# Patient Record
Sex: Female | Born: 1970
Health system: Southern US, Community
[De-identification: ages and names within clinical notes are randomized; demographics above are authoritative.]

## PROBLEM LIST (undated history)

## (undated) DIAGNOSIS — F329 Major depressive disorder, single episode, unspecified: Secondary | ICD-10-CM

## (undated) DIAGNOSIS — E039 Hypothyroidism, unspecified: Secondary | ICD-10-CM

## (undated) DIAGNOSIS — I890 Lymphedema, not elsewhere classified: Secondary | ICD-10-CM

## (undated) DIAGNOSIS — F32A Depression, unspecified: Secondary | ICD-10-CM

## (undated) DIAGNOSIS — R011 Cardiac murmur, unspecified: Secondary | ICD-10-CM

## (undated) DIAGNOSIS — D510 Vitamin B12 deficiency anemia due to intrinsic factor deficiency: Secondary | ICD-10-CM

## (undated) DIAGNOSIS — D649 Anemia, unspecified: Secondary | ICD-10-CM

## (undated) DIAGNOSIS — E785 Hyperlipidemia, unspecified: Secondary | ICD-10-CM

## (undated) DIAGNOSIS — Z720 Tobacco use: Secondary | ICD-10-CM

## (undated) DIAGNOSIS — I499 Cardiac arrhythmia, unspecified: Secondary | ICD-10-CM

## (undated) DIAGNOSIS — R519 Headache, unspecified: Secondary | ICD-10-CM

## (undated) DIAGNOSIS — R002 Palpitations: Secondary | ICD-10-CM

## (undated) DIAGNOSIS — N879 Dysplasia of cervix uteri, unspecified: Secondary | ICD-10-CM

## (undated) DIAGNOSIS — M1990 Unspecified osteoarthritis, unspecified site: Secondary | ICD-10-CM

## (undated) DIAGNOSIS — K219 Gastro-esophageal reflux disease without esophagitis: Secondary | ICD-10-CM

## (undated) DIAGNOSIS — J189 Pneumonia, unspecified organism: Secondary | ICD-10-CM

## (undated) DIAGNOSIS — J45909 Unspecified asthma, uncomplicated: Secondary | ICD-10-CM

## (undated) DIAGNOSIS — T7840XA Allergy, unspecified, initial encounter: Secondary | ICD-10-CM

## (undated) DIAGNOSIS — R0600 Dyspnea, unspecified: Secondary | ICD-10-CM

## (undated) DIAGNOSIS — E119 Type 2 diabetes mellitus without complications: Secondary | ICD-10-CM

## (undated) DIAGNOSIS — N3090 Cystitis, unspecified without hematuria: Secondary | ICD-10-CM

## (undated) DIAGNOSIS — I10 Essential (primary) hypertension: Secondary | ICD-10-CM

## (undated) DIAGNOSIS — F419 Anxiety disorder, unspecified: Secondary | ICD-10-CM

## (undated) DIAGNOSIS — R310 Gross hematuria: Secondary | ICD-10-CM

## (undated) HISTORY — DX: Cystitis, unspecified without hematuria: N30.90

## (undated) HISTORY — DX: Hypothyroidism, unspecified: E03.9

## (undated) HISTORY — DX: Anemia, unspecified: D64.9

## (undated) HISTORY — DX: Vitamin B12 deficiency anemia due to intrinsic factor deficiency: D51.0

## (undated) HISTORY — DX: Cardiac murmur, unspecified: R01.1

## (undated) HISTORY — DX: Cardiac arrhythmia, unspecified: I49.9

## (undated) HISTORY — DX: Gross hematuria: R31.0

## (undated) HISTORY — DX: Hyperlipidemia, unspecified: E78.5

## (undated) HISTORY — DX: Unspecified osteoarthritis, unspecified site: M19.90

## (undated) HISTORY — DX: Allergy, unspecified, initial encounter: T78.40XA

## (undated) HISTORY — DX: Tobacco use: Z72.0

## (undated) HISTORY — DX: Dysplasia of cervix uteri, unspecified: N87.9

---
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
## 1995-03-18 DIAGNOSIS — F429 Obsessive-compulsive disorder, unspecified: Secondary | ICD-10-CM | POA: Insufficient documentation

## 1995-03-18 DIAGNOSIS — F418 Other specified anxiety disorders: Secondary | ICD-10-CM | POA: Insufficient documentation

## 1995-03-18 DIAGNOSIS — F410 Panic disorder [episodic paroxysmal anxiety] without agoraphobia: Secondary | ICD-10-CM | POA: Insufficient documentation

## 1995-04-17 DIAGNOSIS — IMO0002 Reserved for concepts with insufficient information to code with codable children: Secondary | ICD-10-CM | POA: Insufficient documentation

## 1998-02-21 DIAGNOSIS — F329 Major depressive disorder, single episode, unspecified: Secondary | ICD-10-CM | POA: Insufficient documentation

## 1998-12-17 DIAGNOSIS — K8020 Calculus of gallbladder without cholecystitis without obstruction: Secondary | ICD-10-CM | POA: Insufficient documentation

## 1999-03-18 DIAGNOSIS — M519 Unspecified thoracic, thoracolumbar and lumbosacral intervertebral disc disorder: Secondary | ICD-10-CM | POA: Insufficient documentation

## 2005-01-07 ENCOUNTER — Emergency Department: Payer: Self-pay | Admitting: Emergency Medicine

## 2006-12-17 DIAGNOSIS — G5603 Carpal tunnel syndrome, bilateral upper limbs: Secondary | ICD-10-CM | POA: Insufficient documentation

## 2008-05-17 ENCOUNTER — Ambulatory Visit: Payer: Self-pay | Admitting: Internal Medicine

## 2009-08-25 ENCOUNTER — Ambulatory Visit: Payer: Self-pay | Admitting: Internal Medicine

## 2009-12-17 HISTORY — PX: CARPAL TUNNEL RELEASE: SHX101

## 2010-02-18 DIAGNOSIS — I10 Essential (primary) hypertension: Secondary | ICD-10-CM | POA: Insufficient documentation

## 2010-12-17 DIAGNOSIS — F603 Borderline personality disorder: Secondary | ICD-10-CM | POA: Insufficient documentation

## 2011-12-18 DIAGNOSIS — Z8614 Personal history of Methicillin resistant Staphylococcus aureus infection: Secondary | ICD-10-CM

## 2011-12-18 HISTORY — DX: Personal history of Methicillin resistant Staphylococcus aureus infection: Z86.14

## 2011-12-18 HISTORY — PX: FOOT SURGERY: SHX648

## 2012-07-04 DIAGNOSIS — T148XXA Other injury of unspecified body region, initial encounter: Secondary | ICD-10-CM | POA: Insufficient documentation

## 2012-07-28 DIAGNOSIS — M729 Fibroblastic disorder, unspecified: Secondary | ICD-10-CM | POA: Insufficient documentation

## 2013-01-16 DIAGNOSIS — M25579 Pain in unspecified ankle and joints of unspecified foot: Secondary | ICD-10-CM | POA: Insufficient documentation

## 2013-03-17 DIAGNOSIS — M129 Arthropathy, unspecified: Secondary | ICD-10-CM | POA: Insufficient documentation

## 2013-03-24 DIAGNOSIS — R3589 Other polyuria: Secondary | ICD-10-CM | POA: Insufficient documentation

## 2013-03-24 DIAGNOSIS — N920 Excessive and frequent menstruation with regular cycle: Secondary | ICD-10-CM | POA: Insufficient documentation

## 2013-03-24 DIAGNOSIS — N923 Ovulation bleeding: Secondary | ICD-10-CM | POA: Insufficient documentation

## 2013-03-24 DIAGNOSIS — R358 Other polyuria: Secondary | ICD-10-CM | POA: Insufficient documentation

## 2013-03-24 DIAGNOSIS — N939 Abnormal uterine and vaginal bleeding, unspecified: Secondary | ICD-10-CM | POA: Insufficient documentation

## 2013-05-13 DIAGNOSIS — Z3042 Encounter for surveillance of injectable contraceptive: Secondary | ICD-10-CM | POA: Insufficient documentation

## 2013-07-10 DIAGNOSIS — G5751 Tarsal tunnel syndrome, right lower limb: Secondary | ICD-10-CM | POA: Insufficient documentation

## 2013-07-10 DIAGNOSIS — M722 Plantar fascial fibromatosis: Secondary | ICD-10-CM | POA: Insufficient documentation

## 2013-09-28 DIAGNOSIS — R509 Fever, unspecified: Secondary | ICD-10-CM | POA: Insufficient documentation

## 2013-09-28 DIAGNOSIS — E039 Hypothyroidism, unspecified: Secondary | ICD-10-CM | POA: Insufficient documentation

## 2013-09-28 DIAGNOSIS — G8929 Other chronic pain: Secondary | ICD-10-CM | POA: Insufficient documentation

## 2013-09-28 DIAGNOSIS — R6889 Other general symptoms and signs: Secondary | ICD-10-CM | POA: Insufficient documentation

## 2013-09-28 DIAGNOSIS — M545 Low back pain: Secondary | ICD-10-CM

## 2013-09-28 DIAGNOSIS — R5383 Other fatigue: Secondary | ICD-10-CM | POA: Insufficient documentation

## 2013-11-09 DIAGNOSIS — R197 Diarrhea, unspecified: Secondary | ICD-10-CM | POA: Insufficient documentation

## 2013-12-28 DIAGNOSIS — M5136 Other intervertebral disc degeneration, lumbar region: Secondary | ICD-10-CM | POA: Insufficient documentation

## 2013-12-28 DIAGNOSIS — M47816 Spondylosis without myelopathy or radiculopathy, lumbar region: Secondary | ICD-10-CM | POA: Insufficient documentation

## 2013-12-31 DIAGNOSIS — J309 Allergic rhinitis, unspecified: Secondary | ICD-10-CM | POA: Insufficient documentation

## 2014-02-16 DIAGNOSIS — N390 Urinary tract infection, site not specified: Secondary | ICD-10-CM | POA: Insufficient documentation

## 2014-04-02 DIAGNOSIS — R002 Palpitations: Secondary | ICD-10-CM | POA: Insufficient documentation

## 2014-07-18 ENCOUNTER — Emergency Department: Payer: Self-pay | Admitting: Emergency Medicine

## 2014-09-03 DIAGNOSIS — S134XXA Sprain of ligaments of cervical spine, initial encounter: Secondary | ICD-10-CM | POA: Insufficient documentation

## 2014-10-18 ENCOUNTER — Ambulatory Visit: Payer: Self-pay | Admitting: Family Medicine

## 2014-11-02 DIAGNOSIS — B89 Unspecified parasitic disease: Secondary | ICD-10-CM | POA: Insufficient documentation

## 2014-11-02 DIAGNOSIS — J189 Pneumonia, unspecified organism: Secondary | ICD-10-CM | POA: Insufficient documentation

## 2014-11-15 DIAGNOSIS — J0100 Acute maxillary sinusitis, unspecified: Secondary | ICD-10-CM | POA: Insufficient documentation

## 2014-11-15 DIAGNOSIS — J329 Chronic sinusitis, unspecified: Secondary | ICD-10-CM | POA: Insufficient documentation

## 2015-01-11 DIAGNOSIS — R062 Wheezing: Secondary | ICD-10-CM | POA: Diagnosis not present

## 2015-01-11 DIAGNOSIS — Z3042 Encounter for surveillance of injectable contraceptive: Secondary | ICD-10-CM | POA: Diagnosis not present

## 2015-01-17 DIAGNOSIS — N281 Cyst of kidney, acquired: Secondary | ICD-10-CM | POA: Insufficient documentation

## 2015-02-17 ENCOUNTER — Emergency Department: Payer: Self-pay | Admitting: Emergency Medicine

## 2015-02-17 DIAGNOSIS — L03115 Cellulitis of right lower limb: Secondary | ICD-10-CM | POA: Diagnosis not present

## 2015-02-17 DIAGNOSIS — L02415 Cutaneous abscess of right lower limb: Secondary | ICD-10-CM | POA: Diagnosis not present

## 2015-02-17 DIAGNOSIS — I10 Essential (primary) hypertension: Secondary | ICD-10-CM | POA: Diagnosis not present

## 2015-02-17 DIAGNOSIS — Z79899 Other long term (current) drug therapy: Secondary | ICD-10-CM | POA: Diagnosis not present

## 2015-02-17 DIAGNOSIS — Z7951 Long term (current) use of inhaled steroids: Secondary | ICD-10-CM | POA: Diagnosis not present

## 2015-02-18 DIAGNOSIS — R197 Diarrhea, unspecified: Secondary | ICD-10-CM | POA: Diagnosis not present

## 2015-02-18 DIAGNOSIS — L02419 Cutaneous abscess of limb, unspecified: Secondary | ICD-10-CM | POA: Insufficient documentation

## 2015-02-18 DIAGNOSIS — L03115 Cellulitis of right lower limb: Secondary | ICD-10-CM | POA: Insufficient documentation

## 2015-02-21 DIAGNOSIS — F17210 Nicotine dependence, cigarettes, uncomplicated: Secondary | ICD-10-CM | POA: Diagnosis not present

## 2015-02-21 DIAGNOSIS — E039 Hypothyroidism, unspecified: Secondary | ICD-10-CM | POA: Diagnosis not present

## 2015-02-21 DIAGNOSIS — L03115 Cellulitis of right lower limb: Secondary | ICD-10-CM | POA: Diagnosis not present

## 2015-02-21 DIAGNOSIS — R5383 Other fatigue: Secondary | ICD-10-CM | POA: Diagnosis not present

## 2015-02-21 DIAGNOSIS — B372 Candidiasis of skin and nail: Secondary | ICD-10-CM | POA: Insufficient documentation

## 2015-02-21 DIAGNOSIS — K219 Gastro-esophageal reflux disease without esophagitis: Secondary | ICD-10-CM | POA: Diagnosis not present

## 2015-02-21 DIAGNOSIS — M797 Fibromyalgia: Secondary | ICD-10-CM | POA: Diagnosis not present

## 2015-02-21 DIAGNOSIS — F329 Major depressive disorder, single episode, unspecified: Secondary | ICD-10-CM | POA: Diagnosis not present

## 2015-02-21 DIAGNOSIS — I10 Essential (primary) hypertension: Secondary | ICD-10-CM | POA: Diagnosis not present

## 2015-03-05 ENCOUNTER — Emergency Department: Payer: Self-pay | Admitting: Emergency Medicine

## 2015-03-05 DIAGNOSIS — G8929 Other chronic pain: Secondary | ICD-10-CM | POA: Diagnosis not present

## 2015-03-05 DIAGNOSIS — Z79899 Other long term (current) drug therapy: Secondary | ICD-10-CM | POA: Diagnosis not present

## 2015-03-05 DIAGNOSIS — R319 Hematuria, unspecified: Secondary | ICD-10-CM | POA: Diagnosis not present

## 2015-03-05 DIAGNOSIS — F419 Anxiety disorder, unspecified: Secondary | ICD-10-CM | POA: Diagnosis not present

## 2015-03-05 DIAGNOSIS — Z7951 Long term (current) use of inhaled steroids: Secondary | ICD-10-CM | POA: Diagnosis not present

## 2015-03-05 DIAGNOSIS — Z3202 Encounter for pregnancy test, result negative: Secondary | ICD-10-CM | POA: Diagnosis not present

## 2015-03-05 DIAGNOSIS — Z720 Tobacco use: Secondary | ICD-10-CM | POA: Diagnosis not present

## 2015-03-05 DIAGNOSIS — I10 Essential (primary) hypertension: Secondary | ICD-10-CM | POA: Diagnosis not present

## 2015-03-05 DIAGNOSIS — M545 Low back pain: Secondary | ICD-10-CM | POA: Diagnosis not present

## 2015-03-17 DIAGNOSIS — E663 Overweight: Secondary | ICD-10-CM | POA: Diagnosis not present

## 2015-03-17 DIAGNOSIS — Z720 Tobacco use: Secondary | ICD-10-CM | POA: Diagnosis not present

## 2015-03-17 DIAGNOSIS — R310 Gross hematuria: Secondary | ICD-10-CM | POA: Diagnosis not present

## 2015-03-21 DIAGNOSIS — Z1231 Encounter for screening mammogram for malignant neoplasm of breast: Secondary | ICD-10-CM | POA: Diagnosis not present

## 2015-03-23 ENCOUNTER — Ambulatory Visit
Admit: 2015-03-23 | Disposition: A | Discharge status: Home / Self Care | Payer: Self-pay | Attending: Obstetrics and Gynecology | Admitting: Obstetrics and Gynecology

## 2015-03-23 DIAGNOSIS — N281 Cyst of kidney, acquired: Secondary | ICD-10-CM | POA: Diagnosis not present

## 2015-03-23 DIAGNOSIS — I2510 Atherosclerotic heart disease of native coronary artery without angina pectoris: Secondary | ICD-10-CM | POA: Diagnosis not present

## 2015-03-23 DIAGNOSIS — R310 Gross hematuria: Secondary | ICD-10-CM | POA: Diagnosis not present

## 2015-03-23 DIAGNOSIS — K8020 Calculus of gallbladder without cholecystitis without obstruction: Secondary | ICD-10-CM | POA: Diagnosis not present

## 2015-03-23 LAB — HCG, QUANTITATIVE, PREGNANCY: Beta Hcg, Quant.: <1 m[IU]/mL

## 2015-03-25 DIAGNOSIS — R310 Gross hematuria: Secondary | ICD-10-CM | POA: Diagnosis not present

## 2015-04-05 DIAGNOSIS — M7712 Lateral epicondylitis, left elbow: Secondary | ICD-10-CM | POA: Diagnosis not present

## 2015-04-12 DIAGNOSIS — G8929 Other chronic pain: Secondary | ICD-10-CM | POA: Diagnosis not present

## 2015-04-12 DIAGNOSIS — M545 Low back pain: Secondary | ICD-10-CM | POA: Diagnosis not present

## 2015-04-12 DIAGNOSIS — M791 Myalgia: Secondary | ICD-10-CM | POA: Diagnosis not present

## 2015-04-15 DIAGNOSIS — R310 Gross hematuria: Secondary | ICD-10-CM | POA: Diagnosis not present

## 2015-04-26 DIAGNOSIS — M722 Plantar fascial fibromatosis: Secondary | ICD-10-CM | POA: Diagnosis not present

## 2015-05-13 DIAGNOSIS — M722 Plantar fascial fibromatosis: Secondary | ICD-10-CM | POA: Diagnosis not present

## 2015-06-02 DIAGNOSIS — F4310 Post-traumatic stress disorder, unspecified: Secondary | ICD-10-CM | POA: Diagnosis not present

## 2015-06-05 ENCOUNTER — Encounter: Payer: Self-pay | Admitting: Emergency Medicine

## 2015-06-05 ENCOUNTER — Emergency Department
Admission: EM | Admit: 2015-06-05 | Discharge: 2015-06-05 | Disposition: A | Discharge status: Home / Self Care | Payer: Medicare Other | Attending: Emergency Medicine | Admitting: Emergency Medicine

## 2015-06-05 DIAGNOSIS — B372 Candidiasis of skin and nail: Secondary | ICD-10-CM

## 2015-06-05 DIAGNOSIS — J029 Acute pharyngitis, unspecified: Secondary | ICD-10-CM | POA: Insufficient documentation

## 2015-06-05 DIAGNOSIS — B084 Enteroviral vesicular stomatitis with exanthem: Secondary | ICD-10-CM | POA: Diagnosis not present

## 2015-06-05 DIAGNOSIS — R21 Rash and other nonspecific skin eruption: Secondary | ICD-10-CM | POA: Diagnosis present

## 2015-06-05 DIAGNOSIS — Z720 Tobacco use: Secondary | ICD-10-CM | POA: Diagnosis not present

## 2015-06-05 DIAGNOSIS — B379 Candidiasis, unspecified: Secondary | ICD-10-CM | POA: Diagnosis not present

## 2015-06-05 LAB — CBC WITH DIFFERENTIAL/PLATELET
Basophils Absolute: 0.1 10*3/uL (ref 0–0.1)
Basophils Relative: 1 %
Eosinophils Absolute: 0.1 10*3/uL (ref 0–0.7)
Eosinophils Relative: 2 %
HCT: 40.3 % (ref 35.0–47.0)
Hemoglobin: 13.6 g/dL (ref 12.0–16.0)
Lymphocytes Relative: 29 %
Lymphs Abs: 1.7 10*3/uL (ref 1.0–3.6)
MCH: 29.5 pg (ref 26.0–34.0)
MCHC: 33.7 g/dL (ref 32.0–36.0)
MCV: 87.5 fL (ref 80.0–100.0)
Monocytes Absolute: 0.5 10*3/uL (ref 0.2–0.9)
Monocytes Relative: 8 %
Neutro Abs: 3.5 10*3/uL (ref 1.4–6.5)
Neutrophils Relative %: 60 %
Platelets: 223 10*3/uL (ref 150–440)
RBC: 4.61 MIL/uL (ref 3.80–5.20)
RDW: 14.6 % — ABNORMAL HIGH (ref 11.5–14.5)
WBC: 5.9 10*3/uL (ref 3.6–11.0)

## 2015-06-05 LAB — BASIC METABOLIC PANEL
Anion gap: 6 (ref 5–15)
BUN: 10 mg/dL (ref 6–20)
CO2: 25 mmol/L (ref 22–32)
Calcium: 8.9 mg/dL (ref 8.9–10.3)
Chloride: 110 mmol/L (ref 101–111)
Creatinine, Ser: 0.75 mg/dL (ref 0.44–1.00)
GFR calc Af Amer: >60 mL/min (ref >60)
GFR calc non Af Amer: >60 mL/min (ref >60)
Glucose, Bld: 112 mg/dL — ABNORMAL HIGH (ref 65–99)
Potassium: 4.2 mmol/L (ref 3.5–5.1)
Sodium: 141 mmol/L (ref 135–145)

## 2015-06-05 MED ORDER — IBUPROFEN 800 MG PO TABS: 800.0000 mg | ORAL_TABLET | Freq: Three times a day (TID) | ORAL | Status: DC | PRN

## 2015-06-05 MED ORDER — TRAMADOL HCL 50 MG PO TABS: 50.0000 mg | ORAL_TABLET | Freq: Four times a day (QID) | ORAL | Status: DC | PRN

## 2015-06-05 MED ORDER — NYSTATIN 100000 UNIT/GM EX CREA: 1.0000 "application " | TOPICAL_CREAM | Freq: Two times a day (BID) | CUTANEOUS | Status: DC

## 2015-06-05 NOTE — Discharge Instructions (Signed)
Hand, Foot, and Mouth Disease Hand, foot, and mouth disease is an illness caused by a type of germ (virus). Most people are better in 1 week. It can spread easily (contagious). It can be spread through contact with an infected persons:  Spit (saliva).  Snot (nasal discharge).  Poop (stool). HOME CARE  Feed your child healthy foods and drinks.  Avoid salty, spicy, or acidic foods or drinks.  Offer soft foods and cold drinks.  Ask your doctor about replacing body fluid loss (rehydration).  Avoid bottles for younger children if it causes pain. Use a cup, spoon, or syringe.  Keep your child out of childcare, schools, or other group settings during the first few days of the illness, or until they are without fever. GET HELP RIGHT AWAY IF:  Your child has signs of body fluid loss (dehydration):  Peeing (urinating) less.  Dry mouth, tongue, or lips.  Decreased tears or sunken eyes.  Dry skin.  Fast breathing.  Fussy behavior.  Poor color or pale skin.  Fingertips take more than 2 seconds to turn pink again after a gentle squeeze.  Fast weight loss.  Your child's pain does not get better.  Your child has a severe headache, stiff neck, or has a change in behavior.  Your child has sores (ulcers) or blisters on the lips or outside of the mouth. MAKE SURE YOU:  Understand these instructions.  Will watch your child's condition.  Will get help right away if your child is not doing well or gets worse. Document Released: 08/16/2011 Document Revised: 02/25/2012 Document Reviewed: 08/16/2011 [facility] Patient Information 2015 [place], [place]. This information is not intended to replace advice given to you by your health care provider. Make sure you discuss any questions you have with your health care provider.  Candida Infection A Candida infection (also called yeast, fungus, and Monilia infection) is an overgrowth of yeast that can occur anywhere on the body. A yeast  infection commonly occurs in warm, moist body areas. Usually, the infection remains localized but can spread to become a systemic infection. A yeast infection may be a sign of a more severe disease such as diabetes, leukemia, or AIDS. A yeast infection can occur in both men and women. In women, Candida vaginitis is a vaginal infection. It is one of the most common causes of vaginitis. Men usually do not have symptoms or know they have an infection until other problems develop. Men may find out they have a yeast infection because their sex partner has a yeast infection. Uncircumcised men are more likely to get a yeast infection than circumcised men. This is because the uncircumcised glans is not exposed to air and does not remain as dry as that of a circumcised glans. Older adults may develop yeast infections around dentures. CAUSES  Women  Antibiotics.  Steroid medication taken for a long time.  Being overweight (obese).  Diabetes.  Poor immune condition.  Certain serious medical conditions.  Immune suppressive medications for organ transplant patients.  Chemotherapy.  Pregnancy.  Menstruation.  Stress and fatigue.  Intravenous drug use.  Oral contraceptives.  Wearing tight-fitting clothes in the crotch area.  Catching it from a sex partner who has a yeast infection.  Spermicide.  Intravenous, urinary, or other catheters. Men  Catching it from a sex partner who has a yeast infection.  Having oral or anal sex with a person who has the infection.  Spermicide.  Diabetes.  Antibiotics.  Poor immune system.  Medications that suppress  the immune system.  Intravenous drug use.  Intravenous, urinary, or other catheters. SYMPTOMS  Women  Thick, white vaginal discharge.  Vaginal itching.  Redness and swelling in and around the vagina.  Irritation of the lips of the vagina and perineum.  Blisters on the vaginal lips and perineum.  Painful sexual  intercourse.  Low blood sugar (hypoglycemia).  Painful urination.  Bladder infections.  Intestinal problems such as constipation, indigestion, bad breath, bloating, increase in gas, diarrhea, or loose stools. Men  Men may develop intestinal problems such as constipation, indigestion, bad breath, bloating, increase in gas, diarrhea, or loose stools.  Dry, cracked skin on the penis with itching or discomfort.  Jock itch.  Dry, flaky skin.  Athlete's foot.  Hypoglycemia. DIAGNOSIS  Women  A history and an exam are performed.  The discharge may be examined under a microscope.  A culture may be taken of the discharge. Men  A history and an exam are performed.  Any discharge from the penis or areas of cracked skin will be looked at under the microscope and cultured.  Stool samples may be cultured. TREATMENT  Women  Vaginal antifungal suppositories and creams.  Medicated creams to decrease irritation and itching on the outside of the vagina.  Warm compresses to the perineal area to decrease swelling and discomfort.  Oral antifungal medications.  Medicated vaginal suppositories or cream for repeated or recurrent infections.  Wash and dry the irritation areas before applying the cream.  Eating yogurt with Lactobacillus may help with prevention and treatment.  Sometimes painting the vagina with gentian violet solution may help if creams and suppositories do not work. Men  Antifungal creams and oral antifungal medications.  Sometimes treatment must continue for 30 days after the symptoms go away to prevent recurrence. HOME CARE INSTRUCTIONS  Women  Use cotton underwear and avoid tight-fitting clothing.  Avoid colored, scented toilet paper and deodorant tampons or pads.  Do not douche.  Keep your diabetes under control.  Finish all the prescribed medications.  Keep your skin clean and dry.  Consume milk or yogurt with Lactobacillus-active culture  regularly. If you get frequent yeast infections and think that is what the infection is, there are over-the-counter medications that you can get. If the infection does not show healing in 3 days, talk to your caregiver.  Tell your sex partner you have a yeast infection. Your partner may need treatment also, especially if your infection does not clear up or recurs. Men  Keep your skin clean and dry.  Keep your diabetes under control.  Finish all prescribed medications.  Tell your sex partner that you have a yeast infection so he or she can be treated if necessary. SEEK MEDICAL CARE IF:   Your symptoms do not clear up or worsen in one week after treatment.  You have an oral temperature above 102 F (38.9 C).  You have trouble swallowing or eating for a prolonged time.  You develop blisters on and around your vagina.  You develop vaginal bleeding and it is not your menstrual period.  You develop abdominal pain.  You develop intestinal problems as mentioned above.  You get weak or light-headed.  You have painful or increased urination.  You have pain during sexual intercourse. MAKE SURE YOU:   Understand these instructions.  Will watch your condition.  Will get help right away if you are not doing well or get worse. Document Released: 01/10/2005 Document Revised: 04/19/2014 Document Reviewed: 04/24/2010 [employer] Patient Information 2015  [employer]. This information is not intended to replace advice given to you by your health care provider. Make sure you discuss any questions you have with your health care provider.

## 2015-06-05 NOTE — ED Provider Notes (Signed)
[facility] Emergency Department Provider Note ____________________________________________  Time seen: Approximately 10:29 AM  I have reviewed the triage vital signs and the nursing notes.   HISTORY  Chief Complaint Sore Throat and Rash   HPI Amanda Webb is a 44 y.o. female resistance to the emergency department for a rash on her hands and feetand a sore throat. Sore throat started 2 days ago, the rash started yesterday on her hands, and on her feet this morning. Her grandson recently had a viral illness. She keeps him during the day. She denies ever having had similar symptoms.   History reviewed. No pertinent past medical history.  There are no active problems to display for this patient.   Past Surgical History  Procedure Laterality Date  . Foot surgery    . Carpal tunnel release    . Tonsillectomy      Current Outpatient Rx  Name  Route  Sig  Dispense  Refill  . ibuprofen (ADVIL,MOTRIN) 800 MG tablet   Oral   Take 1 tablet (800 mg total) by mouth every 8 (eight) hours as needed.   30 tablet   0   . nystatin cream (MYCOSTATIN)   Topical   Apply 1 application topically 2 (two) times daily.   30 g   0   . traMADol (ULTRAM) 50 MG tablet   Oral   Take 1 tablet (50 mg total) by mouth every 6 (six) hours as needed.   9 tablet   0     Allergies Tetracyclines & related  No family history on file.  Social History History  Substance Use Topics  . Smoking status: Current Every Day Smoker  . Smokeless tobacco: Not on file  . Alcohol Use: Not on file    Review of Systems Constitutional: Positive for chills. No fever. Eyes: No visual changes. ENT: Positive for sore throat. Cardiovascular: Denies chest pain. Respiratory: Denies shortness of breath. Gastrointestinal: No abdominal pain.  No nausea, no vomiting.  No diarrhea.  No constipation. Genitourinary: Negative for dysuria. Musculoskeletal: Negative for back pain. Skin:  Rash on palms and soles, chronic rash on the skin fold of the abdomen. Neurological: Negative for headaches, focal weakness or numbness.  10-point ROS otherwise negative.  ____________________________________________   PHYSICAL EXAM:  VITAL SIGNS: ED Triage Vitals  Enc Vitals Group     BP 06/05/15 1024 136/79 mmHg     Pulse Rate 06/05/15 1024 87     Resp --      Temp 06/05/15 1024 98.9 F (37.2 C)     Temp Source 06/05/15 1024 Oral     SpO2 06/05/15 1024 99 %     Weight 06/05/15 1024 250 lb (113.399 kg)     Height 06/05/15 1024 5\' 7"  (1.702 m)     Head Cir --      Peak Flow --      Pain Score 06/05/15 1017 3     Pain Loc --      Pain Edu? --      Excl. in [place]? --     Constitutional: Alert and oriented. Well appearing and in no acute distress. Eyes: Conjunctivae are normal. PERRL. EOMI. Head: Atraumatic. Nose: No congestion/rhinnorhea. Mouth/Throat: Mucous membranes are moist.  Oropharynx is erythematous. No vesicles or lesions noted on the buccal areas. Neck: No stridor.   Cardiovascular: Normal rate, regular rhythm. Grossly normal heart sounds.  Good peripheral circulation. Respiratory: Normal respiratory effort.  No retractions. Lungs CTAB. Gastrointestinal: Soft  and nontender. No distention. No abdominal bruits. No CVA tenderness. Musculoskeletal: No lower extremity tenderness nor edema.  No joint effusions. Neurologic:  Normal speech and language. No gross focal neurologic deficits are appreciated. Speech is normal. No gait instability. Skin:  Skin is warm, dry and intact. Vesicles are present on palms and soles. Candida appearing rash on skin fold of abdomen. Psychiatric: Mood and affect are normal. Speech and behavior are normal.  ____________________________________________   LABS (all labs ordered are listed, but only abnormal results are displayed)  Labs Reviewed  CBC WITH DIFFERENTIAL/PLATELET - Abnormal; Notable for the following:    RDW 14.6 (*)    All  other components within normal limits  BASIC METABOLIC PANEL - Abnormal; Notable for the following:    Glucose, Bld 112 (*)    All other components within normal limits   ____________________________________________  EKG   ____________________________________________  RADIOLOGY   ____________________________________________   PROCEDURES  Procedure(s) performed: None  Critical Care performed: No  ____________________________________________   INITIAL IMPRESSION / ASSESSMENT AND PLAN / ED COURSE  Pertinent labs & imaging results that were available during my care of the patient were reviewed by me and considered in my medical decision making (see chart for details).  Patient was reassured. She was advised to follow up with primary care for symptoms that are not improving over the week. She was advised to return to the ER for symptoms that change or worsen if she is unable to schedule an appointment. ____________________________________________   FINAL CLINICAL IMPRESSION(S) / ED DIAGNOSES  Final diagnoses:  Hand, foot and mouth disease  Cutaneous candidiasis      Victorino Dike, FNP 06/05/15 [place], MD 06/05/15 [phone]

## 2015-06-05 NOTE — ED Notes (Signed)
MD at bedside. 

## 2015-06-05 NOTE — ED Notes (Signed)
Rash to bilateral hands x1 day , sore thoart x2days

## 2015-06-08 DIAGNOSIS — Z79899 Other long term (current) drug therapy: Secondary | ICD-10-CM | POA: Diagnosis not present

## 2015-06-09 DIAGNOSIS — F419 Anxiety disorder, unspecified: Secondary | ICD-10-CM | POA: Diagnosis not present

## 2015-06-09 DIAGNOSIS — Z0000 Encounter for general adult medical examination without abnormal findings: Secondary | ICD-10-CM | POA: Diagnosis not present

## 2015-06-09 DIAGNOSIS — J449 Chronic obstructive pulmonary disease, unspecified: Secondary | ICD-10-CM | POA: Diagnosis not present

## 2015-06-09 DIAGNOSIS — I10 Essential (primary) hypertension: Secondary | ICD-10-CM | POA: Diagnosis not present

## 2015-06-09 DIAGNOSIS — Z79899 Other long term (current) drug therapy: Secondary | ICD-10-CM | POA: Diagnosis not present

## 2015-06-09 DIAGNOSIS — E780 Pure hypercholesterolemia: Secondary | ICD-10-CM | POA: Diagnosis not present

## 2015-06-09 DIAGNOSIS — R1030 Lower abdominal pain, unspecified: Secondary | ICD-10-CM | POA: Diagnosis not present

## 2015-06-09 DIAGNOSIS — K219 Gastro-esophageal reflux disease without esophagitis: Secondary | ICD-10-CM | POA: Diagnosis not present

## 2015-06-09 DIAGNOSIS — R5383 Other fatigue: Secondary | ICD-10-CM | POA: Diagnosis not present

## 2015-06-09 DIAGNOSIS — K580 Irritable bowel syndrome with diarrhea: Secondary | ICD-10-CM | POA: Diagnosis not present

## 2015-06-09 DIAGNOSIS — Z716 Tobacco abuse counseling: Secondary | ICD-10-CM | POA: Diagnosis not present

## 2015-06-09 DIAGNOSIS — E559 Vitamin D deficiency, unspecified: Secondary | ICD-10-CM | POA: Diagnosis not present

## 2015-06-09 DIAGNOSIS — E039 Hypothyroidism, unspecified: Secondary | ICD-10-CM | POA: Diagnosis not present

## 2015-06-09 DIAGNOSIS — R7989 Other specified abnormal findings of blood chemistry: Secondary | ICD-10-CM | POA: Diagnosis not present

## 2015-06-17 DIAGNOSIS — E1169 Type 2 diabetes mellitus with other specified complication: Secondary | ICD-10-CM | POA: Insufficient documentation

## 2015-06-17 DIAGNOSIS — R7303 Prediabetes: Secondary | ICD-10-CM | POA: Insufficient documentation

## 2015-06-17 DIAGNOSIS — E049 Nontoxic goiter, unspecified: Secondary | ICD-10-CM | POA: Insufficient documentation

## 2015-06-30 DIAGNOSIS — Z01419 Encounter for gynecological examination (general) (routine) without abnormal findings: Secondary | ICD-10-CM | POA: Diagnosis not present

## 2015-06-30 DIAGNOSIS — N760 Acute vaginitis: Secondary | ICD-10-CM | POA: Diagnosis not present

## 2015-06-30 DIAGNOSIS — Z716 Tobacco abuse counseling: Secondary | ICD-10-CM | POA: Diagnosis not present

## 2015-06-30 DIAGNOSIS — E669 Obesity, unspecified: Secondary | ICD-10-CM | POA: Diagnosis not present

## 2015-06-30 DIAGNOSIS — R102 Pelvic and perineal pain: Secondary | ICD-10-CM | POA: Diagnosis not present

## 2015-07-12 DIAGNOSIS — M542 Cervicalgia: Secondary | ICD-10-CM | POA: Diagnosis not present

## 2015-07-12 DIAGNOSIS — R0989 Other specified symptoms and signs involving the circulatory and respiratory systems: Secondary | ICD-10-CM | POA: Diagnosis not present

## 2015-07-12 DIAGNOSIS — Z87891 Personal history of nicotine dependence: Secondary | ICD-10-CM | POA: Diagnosis not present

## 2015-07-14 DIAGNOSIS — F4310 Post-traumatic stress disorder, unspecified: Secondary | ICD-10-CM | POA: Diagnosis not present

## 2015-08-04 ENCOUNTER — Encounter: Payer: Self-pay | Admitting: Emergency Medicine

## 2015-08-04 ENCOUNTER — Other Ambulatory Visit: Payer: Self-pay

## 2015-08-04 ENCOUNTER — Emergency Department
Admission: EM | Admit: 2015-08-04 | Discharge: 2015-08-04 | Disposition: A | Discharge status: Home / Self Care | Payer: Medicare Other | Attending: Emergency Medicine | Admitting: Emergency Medicine

## 2015-08-04 DIAGNOSIS — Z720 Tobacco use: Secondary | ICD-10-CM | POA: Insufficient documentation

## 2015-08-04 DIAGNOSIS — H9201 Otalgia, right ear: Secondary | ICD-10-CM | POA: Diagnosis not present

## 2015-08-04 DIAGNOSIS — R51 Headache: Secondary | ICD-10-CM | POA: Insufficient documentation

## 2015-08-04 DIAGNOSIS — R110 Nausea: Secondary | ICD-10-CM

## 2015-08-04 DIAGNOSIS — R197 Diarrhea, unspecified: Secondary | ICD-10-CM | POA: Diagnosis not present

## 2015-08-04 DIAGNOSIS — Z79899 Other long term (current) drug therapy: Secondary | ICD-10-CM | POA: Insufficient documentation

## 2015-08-04 DIAGNOSIS — R519 Headache, unspecified: Secondary | ICD-10-CM

## 2015-08-04 LAB — URINALYSIS COMPLETE WITH MICROSCOPIC (ARMC ONLY)
Bacteria, UA: NONE SEEN
Bilirubin Urine: NEGATIVE
Glucose, UA: NEGATIVE mg/dL
Ketones, ur: NEGATIVE mg/dL
Leukocytes, UA: NEGATIVE
Nitrite: NEGATIVE
Protein, ur: NEGATIVE mg/dL
Specific Gravity, Urine: 1.002 — ABNORMAL LOW (ref 1.005–1.030)
pH: 6 (ref 5.0–8.0)

## 2015-08-04 LAB — COMPREHENSIVE METABOLIC PANEL
ALT: 22 U/L (ref 14–54)
AST: 25 U/L (ref 15–41)
Albumin: 4.6 g/dL (ref 3.5–5.0)
Alkaline Phosphatase: 97 U/L (ref 38–126)
Anion gap: 6 (ref 5–15)
BUN: 10 mg/dL (ref 6–20)
CO2: 25 mmol/L (ref 22–32)
Calcium: 9.2 mg/dL (ref 8.9–10.3)
Chloride: 109 mmol/L (ref 101–111)
Creatinine, Ser: 0.9 mg/dL (ref 0.44–1.00)
GFR calc Af Amer: >60 mL/min (ref >60)
GFR calc non Af Amer: >60 mL/min (ref >60)
Glucose, Bld: 140 mg/dL — ABNORMAL HIGH (ref 65–99)
Potassium: 3.6 mmol/L (ref 3.5–5.1)
Sodium: 140 mmol/L (ref 135–145)
Total Bilirubin: 0.1 mg/dL — ABNORMAL LOW (ref 0.3–1.2)
Total Protein: 8.1 g/dL (ref 6.5–8.1)

## 2015-08-04 LAB — CBC
HCT: 42.7 % (ref 35.0–47.0)
Hemoglobin: 14 g/dL (ref 12.0–16.0)
MCH: 28.6 pg (ref 26.0–34.0)
MCHC: 32.6 g/dL (ref 32.0–36.0)
MCV: 87.7 fL (ref 80.0–100.0)
Platelets: 299 10*3/uL (ref 150–440)
RBC: 4.87 MIL/uL (ref 3.80–5.20)
RDW: 14 % (ref 11.5–14.5)
WBC: 10 10*3/uL (ref 3.6–11.0)

## 2015-08-04 LAB — LIPASE, BLOOD: Lipase: 37 U/L (ref 22–51)

## 2015-08-04 NOTE — ED Provider Notes (Signed)
[facility] Emergency Department Provider Note   ____________________________________________  Time seen: 2150  I have reviewed the triage vital signs and the nursing notes.   HISTORY  Chief Complaint Weakness; Nausea; and Diarrhea   History limited by: Not Limited   HPI Amanda Webb is a 44 y.o. female who presents to the emergency department today with multiple medical complaints. Her complaints include right ear ache. Headache. Nausea and watery diarrhea. She states that the headache started 2 days ago. She states that it is worse when she tries swallowing or lays on that side. She states the headache is not present when she lies still. In addition she states she feels like she has right ear pain. She was wondering if she cleaned her ear out and hurt her tympanic membrane. Furthermore she started developing nausea and today she also had an episode of watery diarrhea today. She states she went to her primary care doctor's office where she was seen by doctor who thought she might have TMJ or stress. She was prescribed muscle relaxants. She has not tried taking these. She was unsatisfied with his diagnoses.     History reviewed. No pertinent past medical history.  There are no active problems to display for this patient.   Past Surgical History  Procedure Laterality Date  . Foot surgery    . Carpal tunnel release    . Tonsillectomy      Current Outpatient Rx  Name  Route  Sig  Dispense  Refill  . ibuprofen (ADVIL,MOTRIN) 800 MG tablet   Oral   Take 1 tablet (800 mg total) by mouth every 8 (eight) hours as needed.   30 tablet   0   . nystatin cream (MYCOSTATIN)   Topical   Apply 1 application topically 2 (two) times daily.   30 g   0   . traMADol (ULTRAM) 50 MG tablet   Oral   Take 1 tablet (50 mg total) by mouth every 6 (six) hours as needed.   9 tablet   0     Allergies Tetracyclines & related  No family history on  file.  Social History Social History  Substance Use Topics  . Smoking status: Current Every Day Smoker  . Smokeless tobacco: None  . Alcohol Use: None    Review of Systems  Constitutional: Negative for fever. Cardiovascular: Negative for chest pain. Respiratory: Negative for shortness of breath. Gastrointestinal: Positive for nausea and watery diarrhea. Musculoskeletal: Negative for back pain. Skin: Negative for rash. Neurological: Positive for earache   10-point ROS otherwise negative.  ____________________________________________   PHYSICAL EXAM:  VITAL SIGNS: ED Triage Vitals  Enc Vitals Group     BP 08/04/15 2036 135/80 mmHg     Pulse Rate 08/04/15 2036 94     Resp 08/04/15 2036 20     Temp 08/04/15 2036 98.4 F (36.9 C)     Temp Source 08/04/15 2036 Oral     SpO2 08/04/15 2036 97 %     Weight 08/04/15 2036 238 lb (107.956 kg)     Height 08/04/15 2036 5\' 7"  (1.702 m)     Head Cir --      Peak Flow --      Pain Score 08/04/15 2037 6   Constitutional: Alert and oriented. Well appearing and in no distress. Eyes: Conjunctivae are normal. PERRL. Normal extraocular movements. ENT   Head: Normocephalic and atraumatic. TMs without any erythema, bulging, fluid. No obvious perforation.   Nose:  No congestion/rhinnorhea.   Mouth/Throat: Mucous membranes are moist. No pharyngeal exudate or erythema.   Neck: No stridor. Hematological/Lymphatic/Immunilogical: No cervical lymphadenopathy. Cardiovascular: Normal rate, regular rhythm.  No murmurs, rubs, or gallops. Respiratory: Normal respiratory effort without tachypnea nor retractions. Breath sounds are clear and equal bilaterally. No wheezes/rales/rhonchi. Gastrointestinal: Soft and nontender. No distention.  Genitourinary: Deferred Musculoskeletal: Normal range of motion in all extremities. No joint effusions.  No lower extremity tenderness nor edema. Neurologic:  Normal speech and language. No gross focal  neurologic deficits are appreciated. Speech is normal.  Skin:  Skin is warm, dry and intact. No rash noted. Psychiatric: Mood and affect are normal. Speech and behavior are normal. Patient exhibits appropriate insight and judgment.  ____________________________________________    LABS (pertinent positives/negatives)  Labs Reviewed  COMPREHENSIVE METABOLIC PANEL - Abnormal; Notable for the following:    Glucose, Bld 140 (*)    Total Bilirubin 0.1 (*)    All other components within normal limits  LIPASE, BLOOD  CBC  URINALYSIS COMPLETEWITH MICROSCOPIC ([facility] ONLY)     ____________________________________________   EKG  Apolonio Schneiders, attending physician, personally viewed and interpreted this EKG  EKG Time: 2104 Rate: 82 Rhythm: NSR Axis: normal Intervals: qtc 443 QRS: narrow ST changes: no st elevation    ____________________________________________    RADIOLOGY  None  ____________________________________________   PROCEDURES  Procedure(s) performed: None  Critical Care performed: No  ____________________________________________   INITIAL IMPRESSION / ASSESSMENT AND PLAN / ED COURSE  Pertinent labs & imaging results that were available during my care of the patient were reviewed by me and considered in my medical decision making (see chart for details).  Patient had presented to the emergency department today with multiple medical complaints. On physical exam no concerning findings. I do not have a clear etiology of the symptoms however patient is under a fair amount of stress. She did even play a audio clip she had on her phone of an argument she got in with her mother. She states her mother wants to kick her out of the house. I did discuss with the patient that at this point I doubt an intracranial bleed causing the headache given her intermittent nature and lack of other neurologic findings.  ____________________________________________   FINAL  CLINICAL IMPRESSION(S) / ED DIAGNOSES  Final diagnoses:  Ear ache, right  Headache, unspecified headache type  Nausea     Nance Pear, MD 08/04/15 2226

## 2015-08-04 NOTE — ED Notes (Signed)
Pt to ED c/o generalized weakness with n/v/d.

## 2015-08-04 NOTE — Discharge Instructions (Signed)
Please seek medical attention for any high fevers, chest pain, shortness of breath, change in behavior, persistent vomiting, bloody stool or any other new or concerning symptoms.  Stress Stress-related medical problems are becoming increasingly common. The body has a built-in physical response to stressful situations. Faced with pressure, challenge or danger, we need to react quickly. Our bodies release hormones such as cortisol and adrenaline to help do this. These hormones are part of the "fight or flight" response and affect the metabolic rate, heart rate and blood pressure, resulting in a heightened, stressed state that prepares the body for optimum performance in dealing with a stressful situation. It is likely that early man required these mechanisms to stay alive, but usually modern stresses do not call for this, and the same hormones released in today's world can damage health and reduce coping ability. CAUSES  Pressure to perform at work, at school or in sports.  Threats of physical violence.  Money worries.  Arguments.  Family conflicts.  Divorce or separation from significant other.  Bereavement.  New job or unemployment.  Changes in location.  Alcohol or drug abuse. SOMETIMES, THERE IS NO PARTICULAR REASON FOR DEVELOPING STRESS. Almost all people are at risk of being stressed at some time in their lives. It is important to know that some stress is temporary and some is long term.  Temporary stress will go away when a situation is resolved. Most people can cope with short periods of stress, and it can often be relieved by relaxing, taking a walk or getting any type of exercise, chatting through issues with friends, or having a good night's sleep.  Chronic (long-term, continuous) stress is much harder to deal with. It can be psychologically and emotionally damaging. It can be harmful both for an individual and for friends and family. SYMPTOMS Everyone reacts to stress  differently. There are some common effects that help [place] recognize it. In times of extreme stress, people may:  Shake uncontrollably.  Breathe faster and deeper than normal (hyperventilate).  Vomit.  For people with asthma, stress can trigger an attack.  For some people, stress may trigger migraine headaches, ulcers, and body pain. PHYSICAL EFFECTS OF STRESS MAY INCLUDE:  Loss of energy.  Skin problems.  Aches and pains resulting from tense muscles, including neck ache, backache and tension headaches.  Increased pain from arthritis and other conditions.  Irregular heart beat (palpitations).  Periods of irritability or anger.  Apathy or depression.  Anxiety (feeling uptight or worrying).  Unusual behavior.  Loss of appetite.  Comfort eating.  Lack of concentration.  Loss of, or decreased, sex-drive.  Increased smoking, drinking, or recreational drug use.  For women, missed periods.  Ulcers, joint pain, and muscle pain. Post-traumatic stress is the stress caused by any serious accident, strong emotional damage, or extremely difficult or violent experience such as rape or war. Post-traumatic stress victims can experience mixtures of emotions such as fear, shame, depression, guilt or anger. It may include recurrent memories or images that may be haunting. These feelings can last for weeks, months or even years after the traumatic event that triggered them. Specialized treatment, possibly with medicines and psychological therapies, is available. If stress is causing physical symptoms, severe distress or making it difficult for you to function as normal, it is worth seeing your caregiver. It is important to remember that although stress is a usual part of life, extreme or prolonged stress can lead to other illnesses that will need treatment. It is better  to visit a doctor sooner rather than later. Stress has been linked to the development of high blood pressure and heart  disease, as well as insomnia and depression. There is no diagnostic test for stress since everyone reacts to it differently. But a caregiver will be able to spot the physical symptoms, such as:  Headaches.  Shingles.  Ulcers. Emotional distress such as intense worry, low mood or irritability should be detected when the doctor asks pertinent questions to identify any underlying problems that might be the cause. In case there are physical reasons for the symptoms, the doctor may also want to do some tests to exclude certain conditions. If you feel that you are suffering from stress, try to identify the aspects of your life that are causing it. Sometimes you may not be able to change or avoid them, but even a small change can have a positive ripple effect. A simple lifestyle change can make all the difference. STRATEGIES THAT CAN HELP DEAL WITH STRESS:  Delegating or sharing responsibilities.  Avoiding confrontations.  Learning to be more assertive.  Regular exercise.  Avoid using alcohol or street drugs to cope.  Eating a healthy, balanced diet, rich in fruit and vegetables and proteins.  Finding humor or absurdity in stressful situations.  Never taking on more than you know you can handle comfortably.  Organizing your time better to get as much done as possible.  Talking to friends or family and sharing your thoughts and fears.  Listening to music or relaxation tapes.  Relaxation techniques like deep breathing, meditation, and yoga.  Tensing and then relaxing your muscles, starting at the toes and working up to the head and neck. If you think that you would benefit from help, either in identifying the things that are causing your stress or in learning techniques to help you relax, see a caregiver who is capable of helping you with this. Rather than relying on medications, it is usually better to try and identify the things in your life that are causing stress and try to deal with  them. There are many techniques of managing stress including counseling, psychotherapy, aromatherapy, yoga, and exercise. Your caregiver can help you determine what is best for you. Document Released: 02/23/2003 Document Revised: 12/08/2013 Document Reviewed: 01/20/2008 [facility] Patient Information 2015 [place], [place]. This information is not intended to replace advice given to you by your health care provider. Make sure you discuss any questions you have with your health care provider.

## 2015-08-22 ENCOUNTER — Emergency Department
Admission: EM | Admit: 2015-08-22 | Discharge: 2015-08-22 | Disposition: A | Discharge status: Home / Self Care | Payer: Medicare Other | Attending: Emergency Medicine | Admitting: Emergency Medicine

## 2015-08-22 DIAGNOSIS — L03211 Cellulitis of face: Secondary | ICD-10-CM | POA: Diagnosis not present

## 2015-08-22 DIAGNOSIS — Z79899 Other long term (current) drug therapy: Secondary | ICD-10-CM | POA: Insufficient documentation

## 2015-08-22 DIAGNOSIS — Z720 Tobacco use: Secondary | ICD-10-CM | POA: Insufficient documentation

## 2015-08-22 DIAGNOSIS — I10 Essential (primary) hypertension: Secondary | ICD-10-CM | POA: Insufficient documentation

## 2015-08-22 DIAGNOSIS — M25462 Effusion, left knee: Secondary | ICD-10-CM | POA: Diagnosis not present

## 2015-08-22 DIAGNOSIS — R220 Localized swelling, mass and lump, head: Secondary | ICD-10-CM | POA: Diagnosis present

## 2015-08-22 HISTORY — DX: Depression, unspecified: F32.A

## 2015-08-22 HISTORY — DX: Gastro-esophageal reflux disease without esophagitis: K21.9

## 2015-08-22 HISTORY — DX: Essential (primary) hypertension: I10

## 2015-08-22 HISTORY — DX: Anxiety disorder, unspecified: F41.9

## 2015-08-22 HISTORY — DX: Major depressive disorder, single episode, unspecified: F32.9

## 2015-08-22 MED ORDER — CLINDAMYCIN HCL 150 MG PO CAPS: 450.0000 mg | ORAL_CAPSULE | Freq: Four times a day (QID) | ORAL | Status: DC

## 2015-08-22 MED ORDER — CLINDAMYCIN HCL 150 MG PO CAPS
450.0000 mg | ORAL_CAPSULE | Freq: Once | ORAL | Status: AC
  Administered 2015-08-22: 450 mg via ORAL
  Filled 2015-08-22: qty 3

## 2015-08-22 NOTE — Discharge Instructions (Signed)

## 2015-08-22 NOTE — ED Notes (Signed)
Nurse Nellie in room with patient at this time

## 2015-08-22 NOTE — ED Notes (Signed)
Pt came to ED w/ c/o facial swelling.  Pt suspects it is MRSA, stating that her daughter and boyfriend currently have infection.  Pt sts that she woke up yesterday w/ white head inside nostril and her face has begun to swell since.

## 2015-08-22 NOTE — ED Provider Notes (Signed)
[facility] Emergency Department Provider Note  ____________________________________________  Time seen: Approximately 115 PM  I have reviewed the triage vital signs and the nursing notes.   HISTORY  Chief Complaint Facial Swelling    HPI Amanda Webb is a 44 y.o. female with a history of multiple sites of cellulitis who is presenting today with left-sided facial swelling that she believes is spreading from a pustule within her left naris. She also said that she'll low-grade fever at home to 99.8. Did not take any Tylenol or ibuprofen. Michela Pitcher that she has been admitted multiple times in the past for cellulitis. Says that multiple people in her family have MRSA and that she believes that is where she caught this. Said that the left naris had a pustule in this morning which she picked at. Stating now that the left side her face now is swollen and is painful to touch.   Past Medical History  Diagnosis Date  . Hypertension   . Anxiety   . GERD (gastroesophageal reflux disease)   . Depression     There are no active problems to display for this patient.   Past Surgical History  Procedure Laterality Date  . Foot surgery    . Carpal tunnel release    . Tonsillectomy      Current Outpatient Rx  Name  Route  Sig  Dispense  Refill  . ibuprofen (ADVIL,MOTRIN) 800 MG tablet   Oral   Take 1 tablet (800 mg total) by mouth every 8 (eight) hours as needed.   30 tablet   0   . nystatin cream (MYCOSTATIN)   Topical   Apply 1 application topically 2 (two) times daily.   30 g   0   . traMADol (ULTRAM) 50 MG tablet   Oral   Take 1 tablet (50 mg total) by mouth every 6 (six) hours as needed.   9 tablet   0     Allergies Tetracyclines & related  No family history on file.  Social History Social History  Substance Use Topics  . Smoking status: Current Every Day Smoker  . Smokeless tobacco: None  . Alcohol Use: None    Review of  Systems Constitutional: "Low-grade fever" Eyes: No visual changes. ENT: No sore throat. Cardiovascular: Denies chest pain. Respiratory: Denies shortness of breath. Gastrointestinal: No abdominal pain.  No nausea, no vomiting.  No diarrhea.  No constipation. Genitourinary: Negative for dysuria. Musculoskeletal: Negative for back pain. Skin: Negative for rash. Neurological: Negative for headaches, focal weakness or numbness.  10-point ROS otherwise negative.  ____________________________________________   PHYSICAL EXAM:  VITAL SIGNS: ED Triage Vitals  Enc Vitals Group     BP 08/22/15 1031 118/58 mmHg     Pulse Rate 08/22/15 1031 75     Resp 08/22/15 1031 18     Temp 08/22/15 1031 98.7 F (37.1 C)     Temp Source 08/22/15 1031 Oral     SpO2 08/22/15 1031 100 %     Weight 08/22/15 1031 238 lb (107.956 kg)     Height 08/22/15 1031 5\' 7"  (1.702 m)     Head Cir --      Peak Flow --      Pain Score 08/22/15 1031 5     Pain Loc --      Pain Edu? --      Excl. in [place]? --     Constitutional: Alert and oriented. Well appearing and in no acute distress. Eyes:  Conjunctivae are normal. PERRL. EOMI. Head: Atraumatic. Nose: No congestion/rhinnorhea. Mouth/Throat: Mucous membranes are moist.  Oropharynx non-erythematous. Neck: No stridor.   Cardiovascular: Normal rate, regular rhythm. Grossly normal heart sounds.  Good peripheral circulation. Respiratory: Normal respiratory effort.  No retractions. Lungs CTAB. Gastrointestinal: Soft and nontender. No distention. No abdominal bruits. No CVA tenderness. Musculoskeletal: No lower extremity tenderness nor edema.  No joint effusions. Neurologic:  Normal speech and language. No gross focal neurologic deficits are appreciated. No gait instability. Skin: Mild erythema to the left knee or with mild swelling surrounding. There is no induration or pus.  The left naris as well as the surrounding area is mildly tender to palpation.  Psychiatric:  Mood and affect are normal. Speech and behavior are normal.  ____________________________________________   LABS (all labs ordered are listed, but only abnormal results are displayed)  Labs Reviewed - No data to display ____________________________________________  EKG   ____________________________________________  RADIOLOGY   ____________________________________________   PROCEDURES   ____________________________________________   INITIAL IMPRESSION / ASSESSMENT AND PLAN / ED COURSE  Pertinent labs & imaging results that were available during my care of the patient were reviewed by me and considered in my medical decision making (see chart for details).  Patient with likely early cellulitis. We'll discharge with by mouth clindamycin. Discussed strict return precautions including worsening swelling, redness or fever. Says that IV clindamycin as worked for her in the past. Has had MRSA in the past will cover for MRSA. ____________________________________________   FINAL CLINICAL IMPRESSION(S) / ED DIAGNOSES  Acute facial cellulitis. Initial visit.    Orbie Pyo, MD 08/22/15 [id number]

## 2015-08-24 DIAGNOSIS — R011 Cardiac murmur, unspecified: Secondary | ICD-10-CM | POA: Diagnosis not present

## 2015-09-09 DIAGNOSIS — E119 Type 2 diabetes mellitus without complications: Secondary | ICD-10-CM | POA: Diagnosis not present

## 2015-09-09 DIAGNOSIS — K219 Gastro-esophageal reflux disease without esophagitis: Secondary | ICD-10-CM | POA: Diagnosis not present

## 2015-09-09 DIAGNOSIS — E559 Vitamin D deficiency, unspecified: Secondary | ICD-10-CM | POA: Diagnosis not present

## 2015-09-09 DIAGNOSIS — E538 Deficiency of other specified B group vitamins: Secondary | ICD-10-CM | POA: Diagnosis not present

## 2015-09-09 DIAGNOSIS — R5383 Other fatigue: Secondary | ICD-10-CM | POA: Diagnosis not present

## 2015-09-09 DIAGNOSIS — F419 Anxiety disorder, unspecified: Secondary | ICD-10-CM | POA: Diagnosis not present

## 2015-09-09 DIAGNOSIS — Z828 Family history of other disabilities and chronic diseases leading to disablement, not elsewhere classified: Secondary | ICD-10-CM | POA: Diagnosis not present

## 2015-09-09 DIAGNOSIS — I10 Essential (primary) hypertension: Secondary | ICD-10-CM | POA: Diagnosis not present

## 2015-09-09 DIAGNOSIS — E663 Overweight: Secondary | ICD-10-CM | POA: Diagnosis not present

## 2015-09-09 DIAGNOSIS — J302 Other seasonal allergic rhinitis: Secondary | ICD-10-CM | POA: Diagnosis not present

## 2015-09-13 DIAGNOSIS — F4310 Post-traumatic stress disorder, unspecified: Secondary | ICD-10-CM | POA: Diagnosis not present

## 2015-09-23 ENCOUNTER — Encounter: Payer: Self-pay | Admitting: *Deleted

## 2015-09-23 DIAGNOSIS — K922 Gastrointestinal hemorrhage, unspecified: Secondary | ICD-10-CM | POA: Insufficient documentation

## 2015-09-23 DIAGNOSIS — Z720 Tobacco use: Secondary | ICD-10-CM | POA: Diagnosis not present

## 2015-09-23 DIAGNOSIS — I10 Essential (primary) hypertension: Secondary | ICD-10-CM | POA: Insufficient documentation

## 2015-09-23 DIAGNOSIS — K649 Unspecified hemorrhoids: Secondary | ICD-10-CM | POA: Diagnosis not present

## 2015-09-23 DIAGNOSIS — Z3202 Encounter for pregnancy test, result negative: Secondary | ICD-10-CM | POA: Diagnosis not present

## 2015-09-23 DIAGNOSIS — Z79899 Other long term (current) drug therapy: Secondary | ICD-10-CM | POA: Diagnosis not present

## 2015-09-23 DIAGNOSIS — Z792 Long term (current) use of antibiotics: Secondary | ICD-10-CM | POA: Diagnosis not present

## 2015-09-23 DIAGNOSIS — G8929 Other chronic pain: Secondary | ICD-10-CM | POA: Insufficient documentation

## 2015-09-23 DIAGNOSIS — M545 Low back pain: Secondary | ICD-10-CM | POA: Diagnosis present

## 2015-09-23 LAB — URINALYSIS COMPLETE WITH MICROSCOPIC (ARMC ONLY)
Bacteria, UA: NONE SEEN
Bilirubin Urine: NEGATIVE
Glucose, UA: NEGATIVE mg/dL
Ketones, ur: NEGATIVE mg/dL
Leukocytes, UA: NEGATIVE
Nitrite: NEGATIVE
Protein, ur: NEGATIVE mg/dL
Specific Gravity, Urine: 1.009 (ref 1.005–1.030)
pH: 6 (ref 5.0–8.0)

## 2015-09-23 LAB — CBC
HCT: 39 % (ref 35.0–47.0)
Hemoglobin: 13 g/dL (ref 12.0–16.0)
MCH: 29.5 pg (ref 26.0–34.0)
MCHC: 33.3 g/dL (ref 32.0–36.0)
MCV: 88.5 fL (ref 80.0–100.0)
Platelets: 274 10*3/uL (ref 150–440)
RBC: 4.41 MIL/uL (ref 3.80–5.20)
RDW: 14.7 % — ABNORMAL HIGH (ref 11.5–14.5)
WBC: 10.3 10*3/uL (ref 3.6–11.0)

## 2015-09-23 LAB — COMPREHENSIVE METABOLIC PANEL
ALT: 17 U/L (ref 14–54)
AST: 18 U/L (ref 15–41)
Albumin: 4.2 g/dL (ref 3.5–5.0)
Alkaline Phosphatase: 79 U/L (ref 38–126)
Anion gap: 5 (ref 5–15)
BUN: 8 mg/dL (ref 6–20)
CO2: 25 mmol/L (ref 22–32)
Calcium: 8.8 mg/dL — ABNORMAL LOW (ref 8.9–10.3)
Chloride: 111 mmol/L (ref 101–111)
Creatinine, Ser: 0.77 mg/dL (ref 0.44–1.00)
GFR calc Af Amer: >60 mL/min (ref >60)
GFR calc non Af Amer: >60 mL/min (ref >60)
Glucose, Bld: 107 mg/dL — ABNORMAL HIGH (ref 65–99)
Potassium: 3.3 mmol/L — ABNORMAL LOW (ref 3.5–5.1)
Sodium: 141 mmol/L (ref 135–145)
Total Bilirubin: 0.4 mg/dL (ref 0.3–1.2)
Total Protein: 7.4 g/dL (ref 6.5–8.1)

## 2015-09-23 LAB — LIPASE, BLOOD: Lipase: 34 U/L (ref 22–51)

## 2015-09-23 MED ORDER — ACETAMINOPHEN 325 MG PO TABS
650.0000 mg | ORAL_TABLET | Freq: Once | ORAL | Status: AC
  Administered 2015-09-23: 650 mg via ORAL
  Filled 2015-09-23: qty 2

## 2015-09-23 NOTE — ED Notes (Signed)
Pt reports had episode of blood in stool x 1 today. Low grade fever at home. Mild abdominal pain and back pain.

## 2015-09-23 NOTE — ED Notes (Signed)
Pt very anxious and tearful in triage, crying stating "I just don't know how this is happening, I never have a fever, you have to take me back to a room now". Encourage pt that we would draw lab work and check her initial blood counts. Reassured her that her vital signs were stable. We would give her tylenol for her fever.

## 2015-09-24 ENCOUNTER — Emergency Department
Admission: EM | Admit: 2015-09-24 | Discharge: 2015-09-24 | Disposition: A | Discharge status: Home / Self Care | Payer: Medicare Other | Attending: Emergency Medicine | Admitting: Emergency Medicine

## 2015-09-24 DIAGNOSIS — K649 Unspecified hemorrhoids: Secondary | ICD-10-CM

## 2015-09-24 DIAGNOSIS — K922 Gastrointestinal hemorrhage, unspecified: Secondary | ICD-10-CM

## 2015-09-24 LAB — PREGNANCY, URINE: Preg Test, Ur: NEGATIVE

## 2015-09-24 MED ORDER — HYDROCORTISONE 1 % EX CREA: 1.0000 "application " | TOPICAL_CREAM | Freq: Two times a day (BID) | CUTANEOUS | Status: DC

## 2015-09-24 NOTE — ED Provider Notes (Signed)
[facility] Emergency Department Provider Note  ____________________________________________  Time seen: Approximately 1:15 AM  I have reviewed the triage vital signs and the nursing notes.   HISTORY  Chief Complaint Fever and Rectal Bleeding    HPI Amanda Webb is a 44 y.o. female with a history of IBS who is presenting tonight with bright red blood per rectum. She says that she normally has abnormal and loose stools but had a small amount of bright red blood surrounding her stool this evening. She says her stool now is mustard colored and partially formed. She does not note any recent increase in the frequency or looseness of her stools. She is concerned for C. difficile because of multiple antibiotics over the past several months.She says that she has had hemorrhoid in the past with similar presentation of symptoms. She is also concerned because she says she had a fever of 100.7 in the waiting room and was given Tylenol. I do not see any documentation of a fever 100.7. There is one temp of 99.3 documented at 2155. She was given Tylenol at 2117 but the only temperature documented before that is 98.2. She denies any rigors or chills at this time. She said that she did have a low-grade fever at home on her initial triage note. However, did not take any antipyretics at that time. Denies any cough, runny nose nausea or vomiting. No sick contacts. Denies loss of bowel or bladder continence.   Past Medical History  Diagnosis Date  . Hypertension   . Anxiety   . GERD (gastroesophageal reflux disease)   . Depression     There are no active problems to display for this patient.   Past Surgical History  Procedure Laterality Date  . Foot surgery    . Carpal tunnel release    . Tonsillectomy      Current Outpatient Rx  Name  Route  Sig  Dispense  Refill  . clindamycin (CLEOCIN) 150 MG capsule   Oral   Take 3 capsules (450 mg total) by mouth 4 (four) times  daily.   120 capsule   0   . ibuprofen (ADVIL,MOTRIN) 800 MG tablet   Oral   Take 1 tablet (800 mg total) by mouth every 8 (eight) hours as needed.   30 tablet   0   . nystatin cream (MYCOSTATIN)   Topical   Apply 1 application topically 2 (two) times daily.   30 g   0   . traMADol (ULTRAM) 50 MG tablet   Oral   Take 1 tablet (50 mg total) by mouth every 6 (six) hours as needed.   9 tablet   0     Allergies Tetracyclines & related  No family history on file.  Social History Social History  Substance Use Topics  . Smoking status: Current Every Day Smoker  . Smokeless tobacco: None  . Alcohol Use: No    Review of Systems Constitutional: No fever/chills Eyes: No visual changes. ENT: No sore throat. Cardiovascular: Denies chest pain. Respiratory: Denies shortness of breath. Gastrointestinal: Denies abdominal pain at this time despite the mild abdominal and back pain listed on the triage note.  No nausea, no vomiting.  No diarrhea.  No constipation. Genitourinary: Negative for dysuria. Musculoskeletal: Chronic low back pain which is unchanged.  Skin: Negative for rash. Neurological: Negative for headaches, focal weakness or numbness.  10-point ROS otherwise negative.  ____________________________________________   PHYSICAL EXAM:  VITAL SIGNS: ED Triage Vitals  Enc Vitals Group     BP 09/23/15 2112 119/62 mmHg     Pulse Rate 09/23/15 2112 94     Resp 09/23/15 2112 16     Temp 09/23/15 2112 98.2 F (36.8 C)     Temp Source 09/23/15 2112 Oral     SpO2 09/23/15 2112 98 %     Weight 09/23/15 2112 238 lb (107.956 kg)     Height 09/23/15 2112 5\' 6"  (1.676 m)     Head Cir --      Peak Flow --      Pain Score 09/23/15 2110 3     Pain Loc --      Pain Edu? --      Excl. in [place]? --     Constitutional: Alert and oriented. Well appearing and in no acute distress. Eyes: Conjunctivae are normal. PERRL. EOMI. Head: Atraumatic. Nose: No  congestion/rhinnorhea. Mouth/Throat: Mucous membranes are moist.  Oropharynx non-erythematous. Neck: No stridor.   Cardiovascular: Normal rate, regular rhythm. Grossly normal heart sounds.  Good peripheral circulation. Respiratory: Normal respiratory effort.  No retractions. Lungs CTAB. Gastrointestinal: Soft and nontender. No distention. No abdominal bruits. No CVA tenderness. Rectal exam with soft brown stool which is weakly heme positive. There is a non-engorged and nontender hemorrhoid at 11:00.  Musculoskeletal: No lower extremity tenderness nor edema.  No joint effusions. No tenderness to the back. There is no saddle anesthesia. 5 out of 5 strength to the bilateral lower extremities. Neurologic:  Normal speech and language. No gross focal neurologic deficits are appreciated. No gait instability. Skin:  Skin is warm, dry and intact. No rash noted. Psychiatric: Mood and affect are normal. Speech and behavior are normal.  ____________________________________________   LABS (all labs ordered are listed, but only abnormal results are displayed)  Labs Reviewed  COMPREHENSIVE METABOLIC PANEL - Abnormal; Notable for the following:    Potassium 3.3 (*)    Glucose, Bld 107 (*)    Calcium 8.8 (*)    All other components within normal limits  CBC - Abnormal; Notable for the following:    RDW 14.7 (*)    All other components within normal limits  URINALYSIS COMPLETEWITH MICROSCOPIC ([facility] ONLY) - Abnormal; Notable for the following:    Color, Urine STRAW (*)    APPearance CLEAR (*)    Hgb urine dipstick 1+ (*)    Squamous Epithelial / LPF 0-5 (*)    All other components within normal limits  LIPASE, BLOOD  PREGNANCY, URINE   ____________________________________________  EKG   ____________________________________________  RADIOLOGY   ____________________________________________   PROCEDURES    ____________________________________________   INITIAL IMPRESSION / ASSESSMENT  AND PLAN / ED COURSE  Pertinent labs & imaging results that were available during my care of the patient were reviewed by me and considered in my medical decision making (see chart for details).  ----------------------------------------- 2:05 AM on 09/24/2015 -----------------------------------------  Reassuring workup including lab work as well as urine. I discussed at length with the patient how her presentation is low risk for C. difficile considering there is no increased frequency or looseness of her stool. She is asked repeatedly about the fever that she had in triage.  I do not see any documentation of this fever and at this point it has been about 5 hours since the dosing of Tylenol and the patient does not have any Reiger's or chills to indicate that she is mounting a fever. We discussed at length return precautions including any new  or worsening symptoms, return of the fever, abdominal pain or looseness or increased frequency of the stools. The patient understands the plan and is willing to comply. ___________________________________________   FINAL CLINICAL IMPRESSION(S) / ED DIAGNOSES  Acute GI bleed secondary to hemorrhoid. Initial visit.    Orbie Pyo, MD 09/24/15 [phone]

## 2015-09-24 NOTE — ED Notes (Signed)
Call bell at side, pt declines offer for warm blankets.

## 2015-09-24 NOTE — Discharge Instructions (Signed)

## 2015-09-24 NOTE — ED Notes (Signed)
Pt very anxious. Pt states "i am never sick, i never have a fever, they told me out front it was 100.4. My grandson will die if i get sick, i just can't be sick." pt provided with reassurance. Call bell at side. Pt declines offer for warm blanket.

## 2015-10-03 ENCOUNTER — Other Ambulatory Visit: Payer: Self-pay | Admitting: Physician Assistant

## 2015-10-03 DIAGNOSIS — R935 Abnormal findings on diagnostic imaging of other abdominal regions, including retroperitoneum: Secondary | ICD-10-CM | POA: Diagnosis not present

## 2015-10-03 DIAGNOSIS — E876 Hypokalemia: Secondary | ICD-10-CM | POA: Diagnosis not present

## 2015-10-03 DIAGNOSIS — R1032 Left lower quadrant pain: Secondary | ICD-10-CM | POA: Diagnosis not present

## 2015-10-03 DIAGNOSIS — D72829 Elevated white blood cell count, unspecified: Secondary | ICD-10-CM | POA: Diagnosis not present

## 2015-10-03 DIAGNOSIS — G43A0 Cyclical vomiting, not intractable: Secondary | ICD-10-CM | POA: Diagnosis not present

## 2015-10-03 DIAGNOSIS — R1011 Right upper quadrant pain: Secondary | ICD-10-CM | POA: Diagnosis not present

## 2015-10-03 DIAGNOSIS — R197 Diarrhea, unspecified: Secondary | ICD-10-CM | POA: Diagnosis not present

## 2015-10-03 DIAGNOSIS — K219 Gastro-esophageal reflux disease without esophagitis: Secondary | ICD-10-CM | POA: Diagnosis not present

## 2015-10-03 DIAGNOSIS — R1031 Right lower quadrant pain: Secondary | ICD-10-CM | POA: Diagnosis not present

## 2015-10-03 DIAGNOSIS — D729 Disorder of white blood cells, unspecified: Secondary | ICD-10-CM | POA: Diagnosis not present

## 2015-10-07 ENCOUNTER — Ambulatory Visit
Admission: RE | Admit: 2015-10-07 | Discharge: 2015-10-07 | Disposition: A | Discharge status: Home / Self Care | Payer: Medicare Other | Source: Ambulatory Visit | Attending: Physician Assistant | Admitting: Physician Assistant

## 2015-10-07 ENCOUNTER — Emergency Department
Admission: EM | Admit: 2015-10-07 | Discharge: 2015-10-07 | Disposition: A | Discharge status: Home / Self Care | Payer: Medicare Other | Attending: Emergency Medicine | Admitting: Emergency Medicine

## 2015-10-07 DIAGNOSIS — R1030 Lower abdominal pain, unspecified: Secondary | ICD-10-CM | POA: Insufficient documentation

## 2015-10-07 DIAGNOSIS — I10 Essential (primary) hypertension: Secondary | ICD-10-CM | POA: Insufficient documentation

## 2015-10-07 DIAGNOSIS — K8020 Calculus of gallbladder without cholecystitis without obstruction: Secondary | ICD-10-CM | POA: Insufficient documentation

## 2015-10-07 DIAGNOSIS — Z79899 Other long term (current) drug therapy: Secondary | ICD-10-CM | POA: Diagnosis not present

## 2015-10-07 DIAGNOSIS — R1032 Left lower quadrant pain: Secondary | ICD-10-CM

## 2015-10-07 DIAGNOSIS — L299 Pruritus, unspecified: Secondary | ICD-10-CM | POA: Diagnosis not present

## 2015-10-07 DIAGNOSIS — R252 Cramp and spasm: Secondary | ICD-10-CM | POA: Diagnosis not present

## 2015-10-07 DIAGNOSIS — R1031 Right lower quadrant pain: Secondary | ICD-10-CM

## 2015-10-07 DIAGNOSIS — Z720 Tobacco use: Secondary | ICD-10-CM | POA: Insufficient documentation

## 2015-10-07 DIAGNOSIS — Z792 Long term (current) use of antibiotics: Secondary | ICD-10-CM | POA: Diagnosis not present

## 2015-10-07 DIAGNOSIS — R197 Diarrhea, unspecified: Secondary | ICD-10-CM | POA: Insufficient documentation

## 2015-10-07 DIAGNOSIS — R21 Rash and other nonspecific skin eruption: Secondary | ICD-10-CM | POA: Diagnosis present

## 2015-10-07 MED ORDER — EPINEPHRINE 0.3 MG/0.3ML IJ SOAJ: 0.3000 mg | Freq: Once | INTRAMUSCULAR | Status: DC

## 2015-10-07 MED ORDER — IOHEXOL 300 MG/ML  SOLN
100.0000 mL | Freq: Once | INTRAMUSCULAR | Status: AC | PRN
  Administered 2015-10-07: 100 mL via INTRAVENOUS

## 2015-10-07 NOTE — ED Notes (Signed)
MD at bedside. 

## 2015-10-07 NOTE — ED Provider Notes (Signed)
[facility] Emergency Department Provider Note   ____________________________________________  Time seen: 1210  I have reviewed the triage vital signs and the nursing notes.   HISTORY  Chief Complaint Allergic Reaction   History limited by: Not Limited   HPI Amanda Webb is a 44 y.o. female who presents to the emergency department today because of the concern for a possible allergic reaction to a barium swallow study performed earlier today. The patient states that she was doing the study because of concerns for diarrhea and possible IBS. A number of hours after this study and after the barium was swallowed she started developing itchiness on her palms. She saw small red lesions on her palms. She also complained of some right throat swelling and difficulty swallowing. The patient states that she has a history of allergies. She has never had an allergy to any contrast material in the past.   Past Medical History  Diagnosis Date  . Hypertension   . Anxiety   . GERD (gastroesophageal reflux disease)   . Depression     There are no active problems to display for this patient.   Past Surgical History  Procedure Laterality Date  . Foot surgery    . Carpal tunnel release    . Tonsillectomy      Current Outpatient Rx  Name  Route  Sig  Dispense  Refill  . clindamycin (CLEOCIN) 150 MG capsule   Oral   Take 3 capsules (450 mg total) by mouth 4 (four) times daily.   120 capsule   0   . hydrocortisone cream (PREPARATION H HYDROCORTISONE) 1 %   Topical   Apply 1 application topically 2 (two) times daily.   30 g   0   . ibuprofen (ADVIL,MOTRIN) 800 MG tablet   Oral   Take 1 tablet (800 mg total) by mouth every 8 (eight) hours as needed.   30 tablet   0   . nystatin cream (MYCOSTATIN)   Topical   Apply 1 application topically 2 (two) times daily.   30 g   0   . traMADol (ULTRAM) 50 MG tablet   Oral   Take 1 tablet (50 mg total) by  mouth every 6 (six) hours as needed.   9 tablet   0     Allergies Tetracyclines & related  No family history on file.  Social History Social History  Substance Use Topics  . Smoking status: Current Every Day Smoker  . Smokeless tobacco: Not on file  . Alcohol Use: No    Review of Systems  Constitutional: Negative for fever. Cardiovascular: Negative for chest pain. Respiratory: Negative for shortness of breath. Gastrointestinal: Negative for abdominal pain, vomiting and diarrhea. Genitourinary: Negative for dysuria. Musculoskeletal: Negative for back pain. Skin: Small red lesions on palms Neurological: Negative for headaches, focal weakness or numbness.  10-point ROS otherwise negative.  ____________________________________________   PHYSICAL EXAM:  VITAL SIGNS: ED Triage Vitals  Enc Vitals Group     BP 10/07/15 1201 140/61 mmHg     Pulse Rate 10/07/15 1201 80     Resp 10/07/15 1201 20     Temp 10/07/15 1201 98.4 F (36.9 C)     Temp Source 10/07/15 1201 Oral     SpO2 10/07/15 1201 100 %     Weight 10/07/15 1201 234 lb (106.142 kg)     Height 10/07/15 1201 5\' 6"  (1.676 m)   Constitutional: Alert and oriented. Well appearing and in no  distress. Eyes: Conjunctivae are normal. PERRL. Normal extraocular movements. ENT   Head: Normocephalic and atraumatic.   Nose: No congestion/rhinnorhea.   Mouth/Throat: Mucous membranes are moist.   Neck: No stridor. Hematological/Lymphatic/Immunilogical: No cervical lymphadenopathy. Cardiovascular: Normal rate, regular rhythm.  No murmurs, rubs, or gallops. Respiratory: Normal respiratory effort without tachypnea nor retractions. Breath sounds are clear and equal bilaterally. No wheezes/rales/rhonchi. Gastrointestinal: Soft and nontender. No distention.  Genitourinary: Deferred Musculoskeletal: Normal range of motion in all extremities. No joint effusions.  No lower extremity tenderness nor edema. Neurologic:   Normal speech and language. No gross focal neurologic deficits are appreciated. Speech is normal.  Skin:  Skin is warm, dry and intact. No rash noted. No hives. Psychiatric: Mood and affect are normal. Speech and behavior are normal. Patient exhibits appropriate insight and judgment.  ____________________________________________    LABS (pertinent positives/negatives)  None  ____________________________________________   EKG  None  ____________________________________________    RADIOLOGY  None   ____________________________________________   PROCEDURES  Procedure(s) performed: None  Critical Care performed: No  ____________________________________________   INITIAL IMPRESSION / ASSESSMENT AND PLAN / ED COURSE  Pertinent labs & imaging results that were available during my care of the patient were reviewed by me and considered in my medical decision making (see chart for details).  Patient presents to the ED because of concerns for possible allergy reaction. The patient on exam has no signs or symptoms of an allergic reaction. Additionally given that the symptoms started a number of hours after the barium was ingested I think it is unlikely that this represented a true allergic reaction. Furthermore she was only complain ing of one side of her throat swelling. She is in no respiratory distress. I do not feel that she requires any emergent treatment for any possible allergic reaction. I will however give her a new prescription for EpiPen given that her current EpiPen is out of date. Discussed return precautions.  ____________________________________________   FINAL CLINICAL IMPRESSION(S) / ED DIAGNOSES  Final diagnoses:  Itching     Nance Pear, MD 10/07/15 1241
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
## 2015-10-07 NOTE — Discharge Instructions (Signed)
Please seek medical attention for any high fevers, chest pain, shortness of breath, change in behavior, persistent vomiting, bloody stool or any other new or concerning symptoms.   Allergies An allergy is an abnormal reaction to a substance by the body's defense system (immune system). Allergies can develop at any age. WHAT CAUSES ALLERGIES? An allergic reaction happens when the immune system mistakenly reacts to a normally harmless substance, called an allergen, as if it were harmful. The immune system releases antibodies to fight the substance. Antibodies eventually release a chemical called histamine into the bloodstream. The release of histamine is meant to protect the body from infection, but it also causes discomfort. An allergic reaction can be triggered by:  Eating an allergen.  Inhaling an allergen.  Touching an allergen. WHAT TYPES OF ALLERGIES ARE THERE? There are many types of allergies. Common types include:  Seasonal allergies. People with this type of allergy are usually allergic to substances that are only present during certain seasons, such as molds and pollens.  Food allergies.  Drug allergies.  Insect allergies.  Animal dander allergies. WHAT ARE SYMPTOMS OF ALLERGIES? Possible allergy symptoms include:  Swelling of the lips, face, tongue, mouth, or throat.  Sneezing, coughing, or wheezing.  Nasal congestion.  Tingling in the mouth.  Rash.  Itching.  Itchy, red, swollen areas of skin (hives).  Watery eyes.  Vomiting.  Diarrhea.  Dizziness.  Lightheadedness.  Fainting.  Trouble breathing or swallowing.  Chest tightness.  Rapid heartbeat. HOW ARE ALLERGIES DIAGNOSED? Allergies are diagnosed with a medical and family history and one or more of the following:  Skin tests.  Blood tests.  A food diary. A food diary is a record of all the foods and drinks you have in a day and of all the symptoms you experience.  The results of an  elimination diet. An elimination diet involves eliminating foods from your diet and then adding them back in one by one to find out if a certain food causes an allergic reaction. HOW ARE ALLERGIES TREATED? There is no cure for allergies, but allergic reactions can be treated with medicine. Severe reactions usually need to be treated at a hospital. HOW CAN REACTIONS BE PREVENTED? The best way to prevent an allergic reaction is by avoiding the substance you are allergic to. Allergy shots and medicines can also help prevent reactions in some cases. People with severe allergic reactions may be able to prevent a life-threatening reaction called anaphylaxis with a medicine given right after exposure to the allergen.   This information is not intended to replace advice given to you by your health care provider. Make sure you discuss any questions you have with your health care provider.   Document Released: 02/26/2003 Document Revised: 12/24/2014 Document Reviewed: 09/14/2014 Elsevier Interactive Patient Education [employer].

## 2015-10-18 ENCOUNTER — Other Ambulatory Visit: Payer: Self-pay | Admitting: Physician Assistant

## 2015-10-18 DIAGNOSIS — R1011 Right upper quadrant pain: Secondary | ICD-10-CM

## 2015-10-18 DIAGNOSIS — R197 Diarrhea, unspecified: Secondary | ICD-10-CM | POA: Diagnosis not present

## 2015-10-20 ENCOUNTER — Encounter: Payer: Self-pay | Admitting: *Deleted

## 2015-10-21 ENCOUNTER — Ambulatory Visit
Admission: RE | Admit: 2015-10-21 | Discharge: 2015-10-21 | Disposition: A | Discharge status: Home / Self Care | Payer: Medicare Other | Source: Ambulatory Visit | Attending: Physician Assistant | Admitting: Physician Assistant

## 2015-10-21 ENCOUNTER — Other Ambulatory Visit: Payer: Self-pay | Admitting: Physician Assistant

## 2015-10-21 DIAGNOSIS — R1011 Right upper quadrant pain: Secondary | ICD-10-CM | POA: Diagnosis present

## 2015-10-21 DIAGNOSIS — K769 Liver disease, unspecified: Secondary | ICD-10-CM | POA: Insufficient documentation

## 2015-10-21 DIAGNOSIS — K8020 Calculus of gallbladder without cholecystitis without obstruction: Secondary | ICD-10-CM | POA: Diagnosis not present

## 2015-10-21 DIAGNOSIS — K760 Fatty (change of) liver, not elsewhere classified: Secondary | ICD-10-CM | POA: Insufficient documentation

## 2015-10-21 DIAGNOSIS — R932 Abnormal findings on diagnostic imaging of liver and biliary tract: Secondary | ICD-10-CM

## 2015-10-24 ENCOUNTER — Encounter: Payer: Self-pay | Admitting: Urology

## 2015-10-24 ENCOUNTER — Ambulatory Visit: Payer: Self-pay | Admitting: Urology

## 2015-10-24 ENCOUNTER — Encounter: Payer: Self-pay | Admitting: *Deleted

## 2015-10-26 DIAGNOSIS — Z716 Tobacco abuse counseling: Secondary | ICD-10-CM | POA: Diagnosis not present

## 2015-10-26 DIAGNOSIS — T23001A Burn of unspecified degree of right hand, unspecified site, initial encounter: Secondary | ICD-10-CM | POA: Diagnosis not present

## 2015-10-26 DIAGNOSIS — I10 Essential (primary) hypertension: Secondary | ICD-10-CM | POA: Diagnosis not present

## 2015-10-26 DIAGNOSIS — E669 Obesity, unspecified: Secondary | ICD-10-CM | POA: Diagnosis not present

## 2015-10-26 DIAGNOSIS — F329 Major depressive disorder, single episode, unspecified: Secondary | ICD-10-CM | POA: Diagnosis not present

## 2015-11-01 ENCOUNTER — Ambulatory Visit: Admission: RE | Admit: 2015-11-01 | Payer: Medicare Other | Source: Ambulatory Visit

## 2015-11-15 DIAGNOSIS — Z01812 Encounter for preprocedural laboratory examination: Secondary | ICD-10-CM | POA: Diagnosis not present

## 2015-11-17 ENCOUNTER — Encounter
Admission: RE | Admit: 2015-11-17 | Discharge: 2015-11-17 | Disposition: A | Discharge status: Home / Self Care | Payer: Medicare Other | Source: Ambulatory Visit | Attending: Physician Assistant | Admitting: Physician Assistant

## 2015-11-17 DIAGNOSIS — R932 Abnormal findings on diagnostic imaging of liver and biliary tract: Secondary | ICD-10-CM | POA: Insufficient documentation

## 2015-11-22 ENCOUNTER — Encounter
Admission: RE | Admit: 2015-11-22 | Discharge: 2015-11-22 | Disposition: A | Discharge status: Home / Self Care | Payer: Medicare Other | Source: Ambulatory Visit | Attending: Physician Assistant | Admitting: Physician Assistant

## 2015-11-22 DIAGNOSIS — R110 Nausea: Secondary | ICD-10-CM | POA: Diagnosis not present

## 2015-11-22 DIAGNOSIS — R932 Abnormal findings on diagnostic imaging of liver and biliary tract: Secondary | ICD-10-CM | POA: Diagnosis not present

## 2015-11-22 MED ORDER — TECHNETIUM TC 99M MEBROFENIN IV KIT
5.0000 | PACK | Freq: Once | INTRAVENOUS | Status: AC | PRN
  Administered 2015-11-22: 5.23 via INTRAVENOUS

## 2015-11-22 MED ORDER — SINCALIDE 5 MCG IJ SOLR
0.0200 ug/kg | Freq: Once | INTRAMUSCULAR | Status: AC
  Administered 2015-11-22: 2.13 ug via INTRAVENOUS

## 2015-11-23 ENCOUNTER — Other Ambulatory Visit: Payer: Medicare Other

## 2015-11-23 DIAGNOSIS — R197 Diarrhea, unspecified: Secondary | ICD-10-CM | POA: Diagnosis not present

## 2015-11-23 DIAGNOSIS — K760 Fatty (change of) liver, not elsewhere classified: Secondary | ICD-10-CM | POA: Diagnosis not present

## 2015-11-23 DIAGNOSIS — R1084 Generalized abdominal pain: Secondary | ICD-10-CM | POA: Diagnosis not present

## 2015-11-23 DIAGNOSIS — K219 Gastro-esophageal reflux disease without esophagitis: Secondary | ICD-10-CM | POA: Diagnosis not present

## 2015-12-16 DIAGNOSIS — Z30013 Encounter for initial prescription of injectable contraceptive: Secondary | ICD-10-CM | POA: Diagnosis not present

## 2015-12-16 DIAGNOSIS — J209 Acute bronchitis, unspecified: Secondary | ICD-10-CM | POA: Diagnosis not present

## 2016-01-15 ENCOUNTER — Encounter: Payer: Self-pay | Admitting: Emergency Medicine

## 2016-01-15 DIAGNOSIS — R220 Localized swelling, mass and lump, head: Secondary | ICD-10-CM | POA: Diagnosis present

## 2016-01-15 DIAGNOSIS — Z7952 Long term (current) use of systemic steroids: Secondary | ICD-10-CM

## 2016-01-15 DIAGNOSIS — I10 Essential (primary) hypertension: Secondary | ICD-10-CM | POA: Insufficient documentation

## 2016-01-15 DIAGNOSIS — Z7951 Long term (current) use of inhaled steroids: Secondary | ICD-10-CM

## 2016-01-15 DIAGNOSIS — L03211 Cellulitis of face: Secondary | ICD-10-CM | POA: Insufficient documentation

## 2016-01-15 DIAGNOSIS — Z79899 Other long term (current) drug therapy: Secondary | ICD-10-CM | POA: Insufficient documentation

## 2016-01-15 DIAGNOSIS — R21 Rash and other nonspecific skin eruption: Secondary | ICD-10-CM | POA: Diagnosis not present

## 2016-01-15 DIAGNOSIS — Z792 Long term (current) use of antibiotics: Secondary | ICD-10-CM

## 2016-01-15 DIAGNOSIS — F17210 Nicotine dependence, cigarettes, uncomplicated: Secondary | ICD-10-CM

## 2016-01-15 NOTE — ED Notes (Signed)
Pt says Monday she noticed a spot on the right side of her nose on Monday; has been scratching the area; now with redness and swelling; last year pt had cellulitis to the left side of her face;

## 2016-01-16 ENCOUNTER — Emergency Department: Payer: Medicare Other

## 2016-01-16 ENCOUNTER — Encounter: Payer: Self-pay | Admitting: Emergency Medicine

## 2016-01-16 ENCOUNTER — Emergency Department
Admission: EM | Admit: 2016-01-16 | Discharge: 2016-01-16 | Disposition: A | Discharge status: Home / Self Care | Payer: Medicare Other | Attending: Emergency Medicine | Admitting: Emergency Medicine

## 2016-01-16 ENCOUNTER — Emergency Department
Admission: EM | Admit: 2016-01-16 | Discharge: 2016-01-16 | Disposition: A | Discharge status: Home / Self Care | Payer: Medicare Other | Source: Home / Self Care | Attending: Emergency Medicine | Admitting: Emergency Medicine

## 2016-01-16 DIAGNOSIS — L03211 Cellulitis of face: Secondary | ICD-10-CM | POA: Diagnosis not present

## 2016-01-16 DIAGNOSIS — R21 Rash and other nonspecific skin eruption: Secondary | ICD-10-CM | POA: Diagnosis not present

## 2016-01-16 DIAGNOSIS — R220 Localized swelling, mass and lump, head: Secondary | ICD-10-CM | POA: Diagnosis not present

## 2016-01-16 LAB — COMPREHENSIVE METABOLIC PANEL
ALT: 18 U/L (ref 14–54)
AST: 17 U/L (ref 15–41)
Albumin: 4.7 g/dL (ref 3.5–5.0)
Alkaline Phosphatase: 83 U/L (ref 38–126)
Anion gap: 7 (ref 5–15)
BUN: 9 mg/dL (ref 6–20)
CO2: 26 mmol/L (ref 22–32)
Calcium: 9.4 mg/dL (ref 8.9–10.3)
Chloride: 107 mmol/L (ref 101–111)
Creatinine, Ser: 0.75 mg/dL (ref 0.44–1.00)
GFR calc Af Amer: >60 mL/min (ref >60)
GFR calc non Af Amer: >60 mL/min (ref >60)
Glucose, Bld: 120 mg/dL — ABNORMAL HIGH (ref 65–99)
Potassium: 4 mmol/L (ref 3.5–5.1)
Sodium: 140 mmol/L (ref 135–145)
Total Bilirubin: 0.8 mg/dL (ref 0.3–1.2)
Total Protein: 8.3 g/dL — ABNORMAL HIGH (ref 6.5–8.1)

## 2016-01-16 LAB — CBC WITH DIFFERENTIAL/PLATELET
Basophils Absolute: 0.1 10*3/uL (ref 0–0.1)
Basophils Relative: 1 %
Eosinophils Absolute: 0.2 10*3/uL (ref 0–0.7)
Eosinophils Relative: 2 %
HCT: 42 % (ref 35.0–47.0)
Hemoglobin: 13.8 g/dL (ref 12.0–16.0)
Lymphocytes Relative: 23 %
Lymphs Abs: 2.8 10*3/uL (ref 1.0–3.6)
MCH: 28.7 pg (ref 26.0–34.0)
MCHC: 32.8 g/dL (ref 32.0–36.0)
MCV: 87.5 fL (ref 80.0–100.0)
Monocytes Absolute: 0.7 10*3/uL (ref 0.2–0.9)
Monocytes Relative: 6 %
Neutro Abs: 8.7 10*3/uL — ABNORMAL HIGH (ref 1.4–6.5)
Neutrophils Relative %: 68 %
Platelets: 275 10*3/uL (ref 150–440)
RBC: 4.8 MIL/uL (ref 3.80–5.20)
RDW: 14.7 % — ABNORMAL HIGH (ref 11.5–14.5)
WBC: 12.6 10*3/uL — ABNORMAL HIGH (ref 3.6–11.0)

## 2016-01-16 MED ORDER — DOXYCYCLINE HYCLATE 100 MG PO TABS: 100.0000 mg | ORAL_TABLET | Freq: Two times a day (BID) | ORAL | Status: DC

## 2016-01-16 MED ORDER — CLINDAMYCIN PHOSPHATE 300 MG/50ML IV SOLN
300.0000 mg | Freq: Once | INTRAVENOUS | Status: AC
  Administered 2016-01-16: 300 mg via INTRAVENOUS
  Filled 2016-01-16: qty 50

## 2016-01-16 MED ORDER — CLINDAMYCIN HCL 300 MG PO CAPS: 300.0000 mg | ORAL_CAPSULE | Freq: Four times a day (QID) | ORAL | Status: DC

## 2016-01-16 MED ORDER — DOXYCYCLINE HYCLATE 100 MG PO TABS
100.0000 mg | ORAL_TABLET | Freq: Once | ORAL | Status: AC
  Administered 2016-01-16: 100 mg via ORAL
  Filled 2016-01-16: qty 1

## 2016-01-16 MED ORDER — IOHEXOL 300 MG/ML  SOLN
75.0000 mL | Freq: Once | INTRAMUSCULAR | Status: AC | PRN
  Administered 2016-01-16: 75 mL via INTRAVENOUS
  Filled 2016-01-16: qty 75
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
## 2016-01-16 NOTE — Discharge Instructions (Signed)

## 2016-01-16 NOTE — ED Notes (Signed)
Having some facial swelling and redness   Was seen yesterday for same  But states sx's are worse  No resp diff
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
## 2016-01-16 NOTE — ED Notes (Signed)
Pt to ed with c/o facial swelling and redness.  Pt states she was seen here last night for same started on doxycline but has had nausea, and diarrhea.  States this am face is worse with swelling and redness.

## 2016-01-16 NOTE — ED Notes (Signed)
States she was given a doxy this am  Then developed nausea and weakness. States she is allergic to tetracycline

## 2016-01-17 DIAGNOSIS — L03211 Cellulitis of face: Secondary | ICD-10-CM | POA: Diagnosis not present

## 2016-01-18 DIAGNOSIS — F4310 Post-traumatic stress disorder, unspecified: Secondary | ICD-10-CM | POA: Diagnosis not present

## 2016-03-02 ENCOUNTER — Emergency Department
Admission: EM | Admit: 2016-03-02 | Discharge: 2016-03-02 | Disposition: A | Discharge status: Home / Self Care | Payer: Medicare Other | Attending: Emergency Medicine | Admitting: Emergency Medicine

## 2016-03-02 ENCOUNTER — Encounter: Payer: Self-pay | Admitting: Emergency Medicine

## 2016-03-02 DIAGNOSIS — Z79899 Other long term (current) drug therapy: Secondary | ICD-10-CM | POA: Diagnosis not present

## 2016-03-02 DIAGNOSIS — E785 Hyperlipidemia, unspecified: Secondary | ICD-10-CM | POA: Insufficient documentation

## 2016-03-02 DIAGNOSIS — I10 Essential (primary) hypertension: Secondary | ICD-10-CM | POA: Insufficient documentation

## 2016-03-02 DIAGNOSIS — F329 Major depressive disorder, single episode, unspecified: Secondary | ICD-10-CM | POA: Diagnosis not present

## 2016-03-02 DIAGNOSIS — F17210 Nicotine dependence, cigarettes, uncomplicated: Secondary | ICD-10-CM | POA: Insufficient documentation

## 2016-03-02 DIAGNOSIS — J09X2 Influenza due to identified novel influenza A virus with other respiratory manifestations: Secondary | ICD-10-CM | POA: Diagnosis not present

## 2016-03-02 DIAGNOSIS — J101 Influenza due to other identified influenza virus with other respiratory manifestations: Secondary | ICD-10-CM | POA: Diagnosis not present

## 2016-03-02 DIAGNOSIS — R51 Headache: Secondary | ICD-10-CM | POA: Diagnosis not present

## 2016-03-02 DIAGNOSIS — E663 Overweight: Secondary | ICD-10-CM | POA: Diagnosis not present

## 2016-03-02 DIAGNOSIS — R05 Cough: Secondary | ICD-10-CM | POA: Diagnosis not present

## 2016-03-02 LAB — RAPID INFLUENZA A&B ANTIGENS
Influenza A (ARMC): POSITIVE — AB
Influenza B (ARMC): NEGATIVE

## 2016-03-02 MED ORDER — IBUPROFEN 800 MG PO TABS
800.0000 mg | ORAL_TABLET | Freq: Once | ORAL | Status: AC
  Administered 2016-03-02: 800 mg via ORAL

## 2016-03-02 MED ORDER — IBUPROFEN 800 MG PO TABS
ORAL_TABLET | ORAL | Status: AC
  Administered 2016-03-02: 800 mg via ORAL
  Filled 2016-03-02: qty 1

## 2016-03-02 MED ORDER — GUAIFENESIN-CODEINE 100-10 MG/5ML PO SOLN: 5.0000 mL | ORAL | Status: DC | PRN

## 2016-03-02 NOTE — ED Notes (Signed)
States she developed fever and cough since yesterday

## 2016-03-02 NOTE — Discharge Instructions (Signed)
Influenza, Adult Influenza (flu) is an infection in the mouth, nose, and throat (respiratory tract) caused by a virus. The flu can make you feel very ill. Influenza spreads easily from person to person (contagious).  HOME CARE   Only take medicines as told by your doctor.  Use a cool mist humidifier to make breathing easier.  Get plenty of rest until your fever goes away. This usually takes 3 to 4 days.  Drink enough fluids to keep your pee (urine) clear or pale yellow.  Cover your mouth and nose when you cough or sneeze.  Wash your hands well to avoid spreading the flu.  Stay home from work or school until your fever has been gone for at least 1 full day.  Get a flu shot every year. GET HELP RIGHT AWAY IF:   You have trouble breathing or feel short of breath.  Your skin or nails turn blue.  You have severe neck pain or stiffness.  You have a severe headache, facial pain, or earache.  Your fever gets worse or keeps coming back.  You feel sick to your stomach (nauseous), throw up (vomit), or have watery poop (diarrhea).  You have chest pain.  You have a deep cough that gets worse, or you cough up more thick spit (mucus). MAKE SURE YOU:   Understand these instructions.  Will watch your condition.  Will get help right away if you are not doing well or get worse.   This information is not intended to replace advice given to you by your health care provider. Make sure you discuss any questions you have with your health care provider.   Document Released: 09/11/2008 Document Revised: 12/24/2014 Document Reviewed: 03/03/2012 Elsevier Interactive Patient Education 2016 [place] need to continue taking Tylenol or ibuprofen as needed for fever and body aches. Robitussin-AC as needed for cough. Drink lots of fluids. Follow-up with your family doctor if any continued problems.
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
## 2016-03-05 DIAGNOSIS — J309 Allergic rhinitis, unspecified: Secondary | ICD-10-CM | POA: Diagnosis not present

## 2016-03-13 DIAGNOSIS — F4310 Post-traumatic stress disorder, unspecified: Secondary | ICD-10-CM | POA: Diagnosis not present

## 2016-04-16 LAB — HM PAP SMEAR: HM Pap smear: NORMAL

## 2016-05-21 DIAGNOSIS — Z1389 Encounter for screening for other disorder: Secondary | ICD-10-CM | POA: Diagnosis not present

## 2016-05-21 DIAGNOSIS — Z0000 Encounter for general adult medical examination without abnormal findings: Secondary | ICD-10-CM | POA: Diagnosis not present

## 2016-05-21 DIAGNOSIS — R5383 Other fatigue: Secondary | ICD-10-CM | POA: Diagnosis not present

## 2016-05-21 DIAGNOSIS — E039 Hypothyroidism, unspecified: Secondary | ICD-10-CM | POA: Diagnosis not present

## 2016-05-21 DIAGNOSIS — E669 Obesity, unspecified: Secondary | ICD-10-CM | POA: Diagnosis not present

## 2016-05-21 DIAGNOSIS — R7303 Prediabetes: Secondary | ICD-10-CM | POA: Diagnosis not present

## 2016-05-21 DIAGNOSIS — E7800 Pure hypercholesterolemia, unspecified: Secondary | ICD-10-CM | POA: Diagnosis not present

## 2016-05-21 DIAGNOSIS — Z1151 Encounter for screening for human papillomavirus (HPV): Secondary | ICD-10-CM | POA: Diagnosis not present

## 2016-05-21 DIAGNOSIS — I10 Essential (primary) hypertension: Secondary | ICD-10-CM | POA: Diagnosis not present

## 2016-05-21 DIAGNOSIS — E559 Vitamin D deficiency, unspecified: Secondary | ICD-10-CM | POA: Diagnosis not present

## 2016-05-21 DIAGNOSIS — Z01419 Encounter for gynecological examination (general) (routine) without abnormal findings: Secondary | ICD-10-CM | POA: Diagnosis not present

## 2016-06-28 ENCOUNTER — Encounter: Payer: Self-pay | Admitting: Urgent Care

## 2016-06-28 DIAGNOSIS — E039 Hypothyroidism, unspecified: Secondary | ICD-10-CM | POA: Diagnosis not present

## 2016-06-28 DIAGNOSIS — R509 Fever, unspecified: Secondary | ICD-10-CM | POA: Diagnosis present

## 2016-06-28 DIAGNOSIS — Z7951 Long term (current) use of inhaled steroids: Secondary | ICD-10-CM | POA: Diagnosis not present

## 2016-06-28 DIAGNOSIS — I10 Essential (primary) hypertension: Secondary | ICD-10-CM | POA: Insufficient documentation

## 2016-06-28 DIAGNOSIS — R358 Other polyuria: Secondary | ICD-10-CM | POA: Insufficient documentation

## 2016-06-28 DIAGNOSIS — F17210 Nicotine dependence, cigarettes, uncomplicated: Secondary | ICD-10-CM | POA: Insufficient documentation

## 2016-06-28 DIAGNOSIS — E785 Hyperlipidemia, unspecified: Secondary | ICD-10-CM | POA: Diagnosis not present

## 2016-06-28 DIAGNOSIS — Z79899 Other long term (current) drug therapy: Secondary | ICD-10-CM | POA: Insufficient documentation

## 2016-06-28 DIAGNOSIS — M1990 Unspecified osteoarthritis, unspecified site: Secondary | ICD-10-CM | POA: Insufficient documentation

## 2016-06-28 LAB — CBC WITH DIFFERENTIAL/PLATELET
Basophils Absolute: 0.1 10*3/uL (ref 0–0.1)
Basophils Relative: 1 %
Eosinophils Absolute: 0.2 10*3/uL (ref 0–0.7)
Eosinophils Relative: 2 %
HCT: 38.9 % (ref 35.0–47.0)
Hemoglobin: 13.4 g/dL (ref 12.0–16.0)
Lymphocytes Relative: 35 %
Lymphs Abs: 2.9 10*3/uL (ref 1.0–3.6)
MCH: 29.1 pg (ref 26.0–34.0)
MCHC: 34.4 g/dL (ref 32.0–36.0)
MCV: 84.4 fL (ref 80.0–100.0)
Monocytes Absolute: 0.6 10*3/uL (ref 0.2–0.9)
Monocytes Relative: 7 %
Neutro Abs: 4.6 10*3/uL (ref 1.4–6.5)
Neutrophils Relative %: 55 %
Platelets: 247 10*3/uL (ref 150–440)
RBC: 4.6 MIL/uL (ref 3.80–5.20)
RDW: 15.4 % — ABNORMAL HIGH (ref 11.5–14.5)
WBC: 8.3 10*3/uL (ref 3.6–11.0)

## 2016-06-28 LAB — COMPREHENSIVE METABOLIC PANEL
ALT: 16 U/L (ref 14–54)
AST: 19 U/L (ref 15–41)
Albumin: 4.2 g/dL (ref 3.5–5.0)
Alkaline Phosphatase: 81 U/L (ref 38–126)
Anion gap: 8 (ref 5–15)
BUN: 9 mg/dL (ref 6–20)
CO2: 24 mmol/L (ref 22–32)
Calcium: 9 mg/dL (ref 8.9–10.3)
Chloride: 110 mmol/L (ref 101–111)
Creatinine, Ser: 0.7 mg/dL (ref 0.44–1.00)
GFR calc Af Amer: >60 mL/min (ref >60)
GFR calc non Af Amer: >60 mL/min (ref >60)
Glucose, Bld: 94 mg/dL (ref 65–99)
Potassium: 3.5 mmol/L (ref 3.5–5.1)
Sodium: 142 mmol/L (ref 135–145)
Total Bilirubin: 0.6 mg/dL (ref 0.3–1.2)
Total Protein: 7.3 g/dL (ref 6.5–8.1)

## 2016-06-28 LAB — URINALYSIS COMPLETE WITH MICROSCOPIC (ARMC ONLY)
Bacteria, UA: NONE SEEN
Bilirubin Urine: NEGATIVE
Glucose, UA: NEGATIVE mg/dL
Ketones, ur: NEGATIVE mg/dL
Leukocytes, UA: NEGATIVE
Nitrite: NEGATIVE
Protein, ur: 30 mg/dL — AB
Specific Gravity, Urine: 1.025 (ref 1.005–1.030)
pH: 5 (ref 5.0–8.0)

## 2016-06-28 LAB — POCT PREGNANCY, URINE: Preg Test, Ur: NEGATIVE

## 2016-06-28 NOTE — ED Notes (Addendum)
Patient presents with multiple complaints. Patient reports that she has had a fever (Tmax 100.7) and hand tremors since Monday. Patient reports that she has felt "out of it" and "off balance" as well. Decreased PO intake. Patient states, "It may be a UTI because I feel a little crampy"; denies dysuria and gross hematuria.

## 2016-06-29 ENCOUNTER — Emergency Department
Admission: EM | Admit: 2016-06-29 | Discharge: 2016-06-29 | Disposition: A | Discharge status: Home / Self Care | Payer: Medicare Other | Attending: Emergency Medicine | Admitting: Emergency Medicine

## 2016-06-29 DIAGNOSIS — R358 Other polyuria: Secondary | ICD-10-CM

## 2016-06-29 DIAGNOSIS — R3589 Other polyuria: Secondary | ICD-10-CM

## 2016-06-29 MED ORDER — FOSFOMYCIN TROMETHAMINE 3 G PO PACK
3.0000 g | PACK | ORAL | Status: AC
  Administered 2016-06-29: 3 g via ORAL
  Filled 2016-06-29: qty 3

## 2016-06-29 NOTE — ED Notes (Signed)
MD at bedside. 

## 2016-06-29 NOTE — ED Notes (Signed)
Patient states she feels she may have a UTI, she has a hx of "alot" of them when she was younger.  She stated she woke in bed 7/11 in the morning and was soaked in urine.  She feels she is starting to have increasing times of urinary frequency.

## 2016-06-29 NOTE — Discharge Instructions (Signed)
As we discussed, your evaluation was reassuring today.  Since you are having symptoms of urinary tract infection we gave you a 1 time dose of medication (fosfomycin) which should fully treat a mild urinary tract infection.  We also sent your urinalysis to the lab for culture to determine if any specific bacteria grow out.  We recommend that you follow-up with your regular doctor as well as with her psychiatrist to discuss the role that you are decreased dose of benzodiazepines may be playing to your symptoms.
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
## 2016-06-30 LAB — URINE CULTURE
Culture: NO GROWTH
Special Requests: NORMAL

## 2016-07-04 DIAGNOSIS — F4310 Post-traumatic stress disorder, unspecified: Secondary | ICD-10-CM | POA: Diagnosis not present

## 2016-07-18 DIAGNOSIS — L304 Erythema intertrigo: Secondary | ICD-10-CM | POA: Diagnosis not present

## 2016-07-18 DIAGNOSIS — L738 Other specified follicular disorders: Secondary | ICD-10-CM | POA: Diagnosis not present

## 2016-07-18 DIAGNOSIS — D229 Melanocytic nevi, unspecified: Secondary | ICD-10-CM | POA: Diagnosis not present

## 2016-07-18 DIAGNOSIS — L218 Other seborrheic dermatitis: Secondary | ICD-10-CM | POA: Diagnosis not present

## 2016-07-26 DIAGNOSIS — F4310 Post-traumatic stress disorder, unspecified: Secondary | ICD-10-CM | POA: Diagnosis not present

## 2016-08-06 DIAGNOSIS — E039 Hypothyroidism, unspecified: Secondary | ICD-10-CM | POA: Diagnosis not present

## 2016-08-06 DIAGNOSIS — R5383 Other fatigue: Secondary | ICD-10-CM | POA: Diagnosis not present

## 2016-08-06 DIAGNOSIS — R7989 Other specified abnormal findings of blood chemistry: Secondary | ICD-10-CM | POA: Diagnosis not present

## 2016-08-06 DIAGNOSIS — E559 Vitamin D deficiency, unspecified: Secondary | ICD-10-CM | POA: Diagnosis not present

## 2016-08-06 DIAGNOSIS — Z3042 Encounter for surveillance of injectable contraceptive: Secondary | ICD-10-CM | POA: Diagnosis not present

## 2016-08-06 DIAGNOSIS — I340 Nonrheumatic mitral (valve) insufficiency: Secondary | ICD-10-CM | POA: Diagnosis not present

## 2016-08-06 DIAGNOSIS — E7800 Pure hypercholesterolemia, unspecified: Secondary | ICD-10-CM | POA: Diagnosis not present

## 2016-08-06 DIAGNOSIS — I10 Essential (primary) hypertension: Secondary | ICD-10-CM | POA: Diagnosis not present

## 2016-08-14 DIAGNOSIS — Z1231 Encounter for screening mammogram for malignant neoplasm of breast: Secondary | ICD-10-CM | POA: Diagnosis not present

## 2016-08-14 LAB — HM MAMMOGRAPHY: HM Mammogram: NORMAL (ref 0–4)

## 2016-09-11 ENCOUNTER — Encounter: Payer: Self-pay | Admitting: Urology

## 2016-09-11 ENCOUNTER — Ambulatory Visit (INDEPENDENT_AMBULATORY_CARE_PROVIDER_SITE_OTHER): Payer: Medicare Other | Admitting: Urology

## 2016-09-11 VITALS — BP 113/79 | HR 84 | Temp 99.3°F | Ht 66.0 in | Wt 260.0 lb

## 2016-09-11 DIAGNOSIS — Z87448 Personal history of other diseases of urinary system: Secondary | ICD-10-CM | POA: Diagnosis not present

## 2016-09-11 DIAGNOSIS — F419 Anxiety disorder, unspecified: Secondary | ICD-10-CM

## 2016-09-11 DIAGNOSIS — N951 Menopausal and female climacteric states: Secondary | ICD-10-CM | POA: Diagnosis not present

## 2016-09-11 DIAGNOSIS — R3129 Other microscopic hematuria: Secondary | ICD-10-CM | POA: Diagnosis not present

## 2016-09-11 DIAGNOSIS — R350 Frequency of micturition: Secondary | ICD-10-CM | POA: Diagnosis not present

## 2016-09-11 DIAGNOSIS — R32 Unspecified urinary incontinence: Secondary | ICD-10-CM | POA: Diagnosis not present

## 2016-09-11 DIAGNOSIS — N8111 Cystocele, midline: Secondary | ICD-10-CM

## 2016-09-11 LAB — URINALYSIS, COMPLETE
Bilirubin, UA: NEGATIVE
Glucose, UA: NEGATIVE
Ketones, UA: NEGATIVE
Leukocytes, UA: NEGATIVE
Nitrite, UA: NEGATIVE
Specific Gravity, UA: 1.025 (ref 1.005–1.030)
Urobilinogen, Ur: 1 mg/dL (ref 0.2–1.0)
pH, UA: 5.5 (ref 5.0–7.5)

## 2016-09-11 LAB — MICROSCOPIC EXAMINATION

## 2016-09-11 LAB — BLADDER SCAN AMB NON-IMAGING: Scan Result: 0

## 2016-09-11 NOTE — Progress Notes (Signed)
09/11/2016 10:46 AM   Amanda Webb 03-May-1971 [id number]  Referring provider: Vista Mink, [place] [address] [place], [place] [address]  Chief Complaint  Patient presents with  . Urinary Incontinence    HPI: Patient is a 45 -year-old Caucasian female who presents today for urinary incontinence.  Patient states that she has had urinary incontinence for years.    Patient has incontinence with urgency, movement and wetting the bed at night.   She is experiencing several incontinent episodes during the day. She is experiencing two incontinent episodes during the night.  Her incontinence volume is large.   She is wearing 2 pads daily.    She is having associated nocturia and weak urinary stream.   She is not having associated urinary frequency, urgency, dysuria, intermittency, hesitancy and straining to urinate.    She does not have a history of urinary tract infections, STI's or injury to the bladder.   She denies dysuria, gross hematuria, suprapubic pain (has cramping), back pain, abdominal pain or flank pain.    She has had any recent low grade fevers which she states have been happening for the last year.  She has been told it is due to being perimenopausal.  She denies chills, nausea and vomiting.  She does not have a history of nephrolithiasis, GU surgery or GU trauma.    She denies constipation and/or diarrhea.   She feels a tugging sensation at her belly button when her bladder is full.    She has had a CT Urogram in 2016 which noted no explanation for the patient's history of hematuria. 1.5 cm simple cyst posterior aspect of the upper pole of the left kidney. No acute findings in the abdomen or pelvis. Cholelithiasis without evidence of acute cholecystitis at this time.  Cystoscopy was performed, but I do not have access to the note.  Patient states she was told everything was normal.  CT with contrast later that year noted cholelithiasis.    She is drinking  not water daily.   She is drinking several caffeinated beverages daily.  She is drinking zero alcoholic beverages daily.    Her risk factors for incontinence are obesity, vaginal delivery, a family history of incontinence, age, smoking, caffeine and depression.    She is taking benzo's, antidepressants and antipsychotics.    Her UA today noted 3-10 RBC's/hpf.  PVR was 30 mL.    Patient is very anxious during her visit today as she is fearful that she will become severely ill and die.  She is also anxious about having a low grade fever for over one year.  She stated she was told that she was perimenopausal and that is why she is having low grade fevers.    PMH: Past Medical History:  Diagnosis Date  . Anxiety   . Arrhythmia   . Arthritis   . Cystitis   . Depression   . GERD (gastroesophageal reflux disease)   . Gross hematuria   . Heart murmur   . HLD (hyperlipidemia)   . Hypertension   . Hypothyroid   . Over weight   . Tobacco abuse     Surgical History: Past Surgical History:  Procedure Laterality Date  . CARPAL TUNNEL RELEASE    . FOOT SURGERY    . TONSILLECTOMY      Home Medications:    Medication List       Accurate as of 09/11/16 10:46 AM. Always use your most recent med list.  atenolol 50 MG tablet Commonly known as:  TENORMIN Take 50 mg by mouth daily.   clindamycin 300 MG capsule Commonly known as:  CLEOCIN Take 1 capsule (300 mg total) by mouth 4 (four) times daily.   clonazePAM 1 MG tablet Commonly known as:  KLONOPIN Take 1 mg by mouth daily.   cyclobenzaprine 10 MG tablet Commonly known as:  FLEXERIL Take 10 mg by mouth 3 (three) times daily as needed for muscle spasms.   EPINEPHrine 0.3 mg/0.3 mL Soaj injection Commonly known as:  EPIPEN 2-PAK Inject 0.3 mLs (0.3 mg total) into the muscle once.   fluticasone 50 MCG/ACT nasal spray Commonly known as:  FLONASE Place 2 sprays into both nostrils daily.   fluvoxaMINE 100 MG  tablet Commonly known as:  LUVOX Take 150 mg by mouth 2 (two) times daily.   guaiFENesin-codeine 100-10 MG/5ML syrup Take 5 mLs by mouth every 4 (four) hours as needed.   ketoconazole 2 % shampoo Commonly known as:  NIZORAL Apply 1 application topically 2 (two) times a week.   ketoconazole 2 % cream Commonly known as:  NIZORAL Apply 1 application topically daily as needed for irritation.   lamoTRIgine 200 MG tablet Commonly known as:  LAMICTAL Take 200 mg by mouth daily.   levothyroxine 50 MCG tablet Commonly known as:  SYNTHROID, LEVOTHROID Take 50 mcg by mouth daily before breakfast.   pantoprazole 20 MG tablet Commonly known as:  PROTONIX Take 20 mg by mouth daily.   QUEtiapine 50 MG tablet Commonly known as:  SEROQUEL Take 50 mg by mouth 3 (three) times daily.       Allergies:  Allergies  Allergen Reactions  . Bee Venom Anaphylaxis, Hives and Swelling    Carries Epi pen.  . Cat Hair Extract Itching, Other (See Comments) and Swelling    Allergic to trees, nuts, wheat, grass, cats & dogs - itchy watery eyes, swelling. Uses Zyrtec & Flonase & Benadryl if really bad. Used to get allergy shots.  . Tetracyclines & Related Nausea And Vomiting  . Lac Bovis Diarrhea and Nausea And Vomiting  . Lactose Diarrhea and Nausea And Vomiting  . Tape Other (See Comments) and Rash    Needs to use paper tape. Breaks out with severe rash, pulls skin off when using adhesive.    Family History: Family History  Problem Relation Age of Onset  . Cancer Maternal Grandmother   . Kidney disease Maternal Grandfather   . Bladder Cancer Neg Hx     Social History:  reports that she has been smoking Cigarettes.  She has been smoking about 3.00 packs per day. She has never used smokeless tobacco. She reports that she drinks alcohol. She reports that she does not use drugs.  ROS: UROLOGY Frequent Urination?: No Hard to postpone urination?: No Burning/pain with urination?: No Get up at  night to urinate?: No Leakage of urine?: Yes Urine stream starts and stops?: No Trouble starting stream?: No Do you have to strain to urinate?: No Blood in urine?: No Urinary tract infection?: Yes Sexually transmitted disease?: No Injury to kidneys or bladder?: No Painful intercourse?: No Weak stream?: Yes Currently pregnant?: No Vaginal bleeding?: No Last menstrual period?: n  Gastrointestinal Nausea?: No Vomiting?: No Indigestion/heartburn?: No Diarrhea?: No Constipation?: No  Constitutional Fever: Yes Night sweats?: Yes Weight loss?: No Fatigue?: Yes  Skin Skin rash/lesions?: No Itching?: No  Eyes Blurred vision?: Yes Double vision?: No  Ears/Nose/Throat Sore throat?: Yes Sinus problems?: Yes  Hematologic/Lymphatic Swollen  glands?: Yes Easy bruising?: No  Cardiovascular Leg swelling?: No Chest pain?: No  Respiratory Cough?: No Shortness of breath?: No  Endocrine Excessive thirst?: No  Musculoskeletal Back pain?: Yes Joint pain?: No  Neurological Headaches?: No Dizziness?: No  Psychologic Depression?: Yes Anxiety?: Yes  Physical Exam: BP 113/79 (BP Location: Right Arm, Patient Position: Sitting, Cuff Size: Large)   Pulse 84   Temp 99.3 F (37.4 C) (Oral)   Ht 5\' 6"  (1.676 m)   Wt 260 lb (117.9 kg)   BMI 41.97 kg/m   Constitutional: Well nourished. Alert and oriented, No acute distress. HEENT: [place] AT, moist mucus membranes. Trachea midline, no masses. Cardiovascular: No clubbing, cyanosis, or edema. Respiratory: Normal respiratory effort, no increased work of breathing. GI: Abdomen is soft, non tender, non distended, no abdominal masses. Liver and spleen not palpable.  No hernias appreciated.  Stool sample for occult testing is not indicated.   GU: No CVA tenderness.  No bladder fullness or masses.  Normal external genitalia, normal pubic hair distribution, no lesions.  Normal urethral meatus, no lesions, no prolapse, no discharge.    No urethral masses, tenderness and/or tenderness. No bladder fullness, tenderness or masses. Normal vagina mucosa, good estrogen effect, no discharge, no lesions, good pelvic support, Grade I cystocele is noted.  No rectocele noted.  No cervical motion tenderness.  Uterus is freely mobile and non-fixed.  No adnexal/parametria masses or tenderness noted.  Anus and perineum are without rashes or lesions.    Skin: No rashes, bruises or suspicious lesions. Lymph: No cervical or inguinal adenopathy. Neurologic: Grossly intact, no focal deficits, moving all 4 extremities. Psychiatric: Normal mood and affect.  Laboratory Data: Lab Results  Component Value Date   WBC 8.3 06/28/2016   HGB 13.4 06/28/2016   HCT 38.9 06/28/2016   MCV 84.4 06/28/2016   PLT 247 06/28/2016    Lab Results  Component Value Date   CREATININE 0.70 06/28/2016     Lab Results  Component Value Date   AST 19 06/28/2016   Lab Results  Component Value Date   ALT 16 06/28/2016    Urinalysis 3-10 RBC's/hpf.  See EPIC.    Pertinent Imaging: CLINICAL DATA:  45 year old female with gross hematuria for 2 weeks.  Currently undergoing treatment for urinary tract infection.   EXAM:  CT ABDOMEN AND PELVIS WITHOUT AND WITH CONTRAST   TECHNIQUE:  Multidetector CT imaging of the abdomen and pelvis was performed  following the standard protocol before and following the bolus  administration of intravenous contrast.   CONTRAST:  100 mL of Omnipaque 300.   COMPARISON:  No priors.   FINDINGS:  Lower chest:  Unremarkable.   Hepatobiliary: No definite cystic or solid hepatic lesions  identified. Multiple small calcified gallstones are noted  dependently in the gallbladder. No current findings to suggest an  acute cholecystitis at this time.   Pancreas: Unremarkable.   Spleen: Unremarkable.   Adrenals/Urinary Tract: No calculi are identified within the  collecting system of either kidney, along the course  of either  ureter, or within the lumen of the urinary bladder. No  hydroureteronephrosis or perinephric stranding to indicate urinary  tract obstruction at this time. 1.5 cm low-attenuation nonenhancing  lesion in the posterior aspect of the upper pole of the left kidney  is compatible with a small simple cyst. No filling defects within  the distal ureters or lumen of the urinary bladder on delayed images  to suggest presence of a urothelial neoplasm. Bilateral  adrenal  glands are normal in appearance.   Stomach/Bowel: The appearance of the stomach is normal. No  pathologic dilatation of small bowel or colon.   Vascular/Lymphatic: Mild atherosclerosis throughout the abdominal  and pelvic vasculature, without evidence of aneurysm or dissection.  No lymphadenopathy noted in the abdomen or pelvis.   Reproductive: Uterus and ovaries are unremarkable in appearance.   Other: No significant volume of ascites.  No pneumoperitoneum.   Musculoskeletal: There are no aggressive appearing lytic or blastic  lesions noted in the visualized portions of the skeleton.   IMPRESSION:  1. No explanation for the patient's history of hematuria.  2. 1.5 cm simple cyst posterior aspect of the upper pole of the left  kidney.  3. No acute findings in the abdomen or pelvis.  4. Cholelithiasis without evidence of acute cholecystitis at this  time.    Electronically Signed    By: Vinnie Langton M.D.    On: 03/23/2015 15:51   CLINICAL DATA:  Worsening diarrhea and cramping over 20 years. Fever for 3 weeks.  EXAM: CT ABDOMEN AND PELVIS WITH CONTRAST  TECHNIQUE: Multidetector CT imaging of the abdomen and pelvis was performed using the standard protocol following bolus administration of intravenous contrast.  CONTRAST:  161mL OMNIPAQUE IOHEXOL 300 MG/ML  SOLN  COMPARISON:  03/23/2015  FINDINGS: Patchy density at the base of the middle lobe is likely related to focal pneumonitis or  volume loss on image 1.  Cholelithiasis  Liver, spleen, pancreas, adrenal glands are within normal limits  Stable cyst in the left kidney.  Right kidney is unremarkable.  Normal appendix.  Bladder is distended.  No obvious pathology of the colon.  Uterus and adnexa are within normal limits.  Uterus is retroflexed.  Left ovarian vein is prominent.  No free-fluid.  No abnormal retroperitoneal adenopathy  No vertebral compression deformity. Miles facet arthropathy at L4-5 and L5-S1. Posterior disc osteophytes in the lower thoracic spine.  IMPRESSION: Cholelithiasis.   Electronically Signed   By: Marybelle Killings M.D.   On: 10/07/2015 08:52  Assessment & Plan:    1. History of hematuria  - 3-10 RBC's/hpf noted on today's UA  - CT Urogram and cystoscopy completed in 2016  - Urinalysis, Complete  - Bladder Scan (Post Void Residual) in office  - encouraged to stop smoking  - if urine culture is negative, refer to nephrology  2. Incontinence  - Bladder Scan (Post Void Residual) in office  - offered behavioral therapies; bladder training, bladder control strategies, pelvic floor muscle training and fluid management   - she would like a referral to PT for pelvic floor strengthening   3. Cystocele  - encourage to lose weight  - see above  4. Anxiety  - patient would like a referral to a therapist  - refer to [facility] [place] for mindfulness training  5. Perimenopausal  - patient would like a referral to a gynecologist  Return for follow up after PT.  These notes generated with voice recognition software. I apologize for typographical errors.  Zara Council, [facility] [address], [place], [place] [address] [phone]

## 2016-09-14 LAB — CULTURE, URINE COMPREHENSIVE

## 2016-09-26 ENCOUNTER — Encounter: Payer: Self-pay | Admitting: Family Medicine

## 2016-09-26 ENCOUNTER — Ambulatory Visit (INDEPENDENT_AMBULATORY_CARE_PROVIDER_SITE_OTHER): Payer: Medicare Other | Admitting: Family Medicine

## 2016-09-26 VITALS — BP 134/72 | HR 75 | Temp 98.9°F | Resp 16 | Ht 66.0 in | Wt 253.0 lb

## 2016-09-26 DIAGNOSIS — M545 Low back pain, unspecified: Secondary | ICD-10-CM

## 2016-09-26 DIAGNOSIS — R6889 Other general symptoms and signs: Secondary | ICD-10-CM | POA: Diagnosis not present

## 2016-09-26 DIAGNOSIS — M129 Arthropathy, unspecified: Secondary | ICD-10-CM | POA: Diagnosis not present

## 2016-09-26 DIAGNOSIS — F418 Other specified anxiety disorders: Secondary | ICD-10-CM | POA: Diagnosis not present

## 2016-09-26 DIAGNOSIS — Z23 Encounter for immunization: Secondary | ICD-10-CM | POA: Diagnosis not present

## 2016-09-26 DIAGNOSIS — M2550 Pain in unspecified joint: Secondary | ICD-10-CM

## 2016-09-26 DIAGNOSIS — M47816 Spondylosis without myelopathy or radiculopathy, lumbar region: Secondary | ICD-10-CM

## 2016-09-26 DIAGNOSIS — E782 Mixed hyperlipidemia: Secondary | ICD-10-CM

## 2016-09-26 DIAGNOSIS — Z7689 Persons encountering health services in other specified circumstances: Secondary | ICD-10-CM | POA: Diagnosis not present

## 2016-09-26 DIAGNOSIS — R7303 Prediabetes: Secondary | ICD-10-CM | POA: Diagnosis not present

## 2016-09-26 DIAGNOSIS — F410 Panic disorder [episodic paroxysmal anxiety] without agoraphobia: Secondary | ICD-10-CM

## 2016-09-26 DIAGNOSIS — E039 Hypothyroidism, unspecified: Secondary | ICD-10-CM | POA: Diagnosis not present

## 2016-09-26 DIAGNOSIS — E559 Vitamin D deficiency, unspecified: Secondary | ICD-10-CM | POA: Insufficient documentation

## 2016-09-26 DIAGNOSIS — M19049 Primary osteoarthritis, unspecified hand: Secondary | ICD-10-CM

## 2016-09-26 DIAGNOSIS — Z6841 Body Mass Index (BMI) 40.0 and over, adult: Secondary | ICD-10-CM | POA: Insufficient documentation

## 2016-09-26 DIAGNOSIS — G8929 Other chronic pain: Secondary | ICD-10-CM

## 2016-09-26 DIAGNOSIS — E785 Hyperlipidemia, unspecified: Secondary | ICD-10-CM | POA: Insufficient documentation

## 2016-09-26 NOTE — Assessment & Plan Note (Signed)
Subjective elevated body temperature over 2 years, thought to be hormonal related (on Depo) and with night sweats. No other significant related symptoms. Previously advised peri-menopausal. - Patient scheduled to establish with [facility] next week, follow-up recommendations

## 2016-09-26 NOTE — Assessment & Plan Note (Addendum)
Stable, without active pain or flare. Reviewed prior Lumbar MRI 2014 See A&P under Arthritis multiple joints - Maximize conservative management follow-up

## 2016-09-26 NOTE — Progress Notes (Signed)
Subjective:    Patient ID: Amanda Webb, female    DOB: 04-14-1971, 45 y.o.   MRN: [id number]  Amanda Webb is a 45 y.o. female presenting on 09/26/2016 for Establish Care  Previously established with [facility], here to re-establish.  Psychiatry - [facility]) Dr Amanda Webb - 2 years [facility] - Dr Amanda Webb ([facility])  HPI   Anxiety / Depression - Chronic problem >17 years, initial dx since prior history in 1999-2000, describes she was at home with children, "head started buzzing", went to ED, dx with anxiety and panic disorder. Initially treated [facility] for several years, then quit, frustrated, and resumed care with [facility], now established with Dr Amanda Webb for past 2 years. - Currently without any new anxiety or mood concerns today. - Prior med failures on Paxil and Buspar initially among other medications. Currently improved and stable on Fluvoxamine '150mg'$  BID, Lamictal '200mg'$  daily, Seroquel '50mg'$  TID, also has been on chronic BDZ with Clonazepam as much as '1mg'$  TID (over past 8 years), recently has been interested in tapering off, not interested in long-term use of that medication, and concerns with addiction given multiple family members and other people, she has been advised to taper down appropriately, currently has weaned all the way down to Clonazepam 0.'5mg'$  tabs taking half tab 0.'25mg'$  BID, then next visit plans to taper to 0.'25mg'$  daily and then off - Does not like taking new medications, concerned about side effects, and states if she "does not die within 24 hours" then she will consider taking it longer - Describes family history of mental illness, substance abuse - Admits her biggest motivation is her Amanda Webb (45 year old) and she tries to be well for him - No longer attending regular psychology therapy in [place] due to distance, may be interested in future (PT will discuss some with her in  near future) - Admits associated insomnia  Arthritis Multiple Sites / DJD Lumbar Spine and Knees: - Chronic problems over past >5 years various joints, specifically low back with lumbar spine DJD had prior work-up with [facility] back in 2014, had x-rays and Lumbar MRI showed some DJD. No significant diagnosis or surgery. Had lumbar epidural injection without improvement. No further treatments. Additionally had Knee x-ray in 2015 with some mild DJD as well, no knee treatments, surgeries or injections. - She reports chronic intermittent aches and pains with flare ups in several joints bilaterally. Denies any known prior injury or trauma. Describes concern with hand and finger pain, various fingers various joints with occasionally some swelling, but denies redness or joint deformity. No known family history of RA. - H/o Carpal Tunnel Syndrome bilaterally, s/p release 2011 bilateral with resolution, had prior NCS - Admits intermittent numbness and tingling down bilateral arms from elbows, usually when puts pressure on elbows - Takes occasional minimal dose Tylenol 500-'1000mg'$  x 1 dose, and Ibuprofen '200mg'$  x 2 doses. Some improvement - Not using heat or ice. No regular activity - Has had labs checking vitamins including B12 normal - Admits hand shaking randomly  MORBID OBESITY BMI >40 / Pre-Diabetes: - Chronic weight gain. Concern with family history of diabetes. Patient has history of abnormal blood sugars, previously advised to take Metformin, tried for 1 day and it made her sick, stopped treatment. - Does not recall last A1c or cholesterol results. Not taking statin or cholesterol medication.  Chronic Low Grade temp normal to 100.5* for 2 years - Intermittent - Depo  provera injections 5-6 years - Night sweats - Advised peri-menopausal, "never looked into" - Last pap smear 07/2016, chart review of prior cervical dysplasia - Last mammogram 08/2016   Urology / Incontinence - Recently established  with [facility] Dr Amanda Webb 09/11/16, for variety of issues, has had some hematuria by chart review, and had imaging work-up, checked PVR in office recently. Additionally with incontinence and cystocele, she was referred to Pelvic Floor Rehab PT. Has not started yet  H/o Heart Skipped Beats / PVC - Reports problem since age 53, has not had significant persistent issues or worsening. Not associated with symptoms. - Previously wore holter monitor for 3 days, never captured  Past Medical History:  Diagnosis Date  . Allergy   . Anxiety   . Arrhythmia   . Arthritis   . Cystitis   . Depression   . GERD (gastroesophageal reflux disease)   . Gross hematuria   . Heart murmur   . HLD (hyperlipidemia)   . Hypertension   . Hypothyroid   . Over weight   . Tobacco abuse    Social History   Social History  . Marital status: Divorced    Spouse name: N/A  . Number of children: N/A  . Years of education: Some college   Occupational History  . Not on file.   Social History Main Topics  . Smoking status: Current Every Day Smoker    Packs/day: 1.00    Years: 30.00    Types: Cigarettes  . Smokeless tobacco: Never Used  . Alcohol use 0.0 oz/week     Comment: Monthly, 3 drinks  . Drug use: No  . Sexual activity: Not on file   Other Topics Concern  . Not on file   Social History Narrative  . No narrative on file   Family History  Problem Relation Age of Onset  . Depression Mother   . Mental illness Mother   . Alcohol abuse Mother   . Cancer Maternal Grandmother   . Diabetes Maternal Grandmother   . Kidney disease Maternal Grandfather   . Diabetes Father   . Mental illness Father   . Depression Father   . Drug abuse Father   . Depression Paternal Grandfather   . Drug abuse Paternal Grandfather   . Bladder Cancer Neg Hx    Current Outpatient Prescriptions on File Prior to Visit  Medication Sig  . atenolol (TENORMIN) 50 MG tablet Take 50 mg by mouth daily.  . clonazePAM  (KLONOPIN) 1 MG tablet Take 0.5 mg by mouth daily.   Marland Kitchen EPINEPHrine (EPIPEN 2-PAK) 0.3 mg/0.3 mL IJ SOAJ injection Inject 0.3 mLs (0.3 mg total) into the muscle once.  . fluticasone (FLONASE) 50 MCG/ACT nasal spray Place 2 sprays into both nostrils daily.  . fluvoxaMINE (LUVOX) 100 MG tablet Take 150 mg by mouth 2 (two) times daily.  Marland Kitchen ketoconazole (NIZORAL) 2 % cream Apply 1 application topically daily as needed for irritation.  Marland Kitchen ketoconazole (NIZORAL) 2 % shampoo Apply 1 application topically 2 (two) times a week.  . lamoTRIgine (LAMICTAL) 200 MG tablet Take 200 mg by mouth daily.  Marland Kitchen levothyroxine (SYNTHROID, LEVOTHROID) 50 MCG tablet Take 50 mcg by mouth daily before breakfast.  . pantoprazole (PROTONIX) 20 MG tablet Take 20 mg by mouth daily.  . QUEtiapine (SEROQUEL) 50 MG tablet Take 50 mg by mouth 3 (three) times daily.   No current facility-administered medications on file prior to visit.     Review of Systems  Constitutional:  Negative for activity change, appetite change, chills, diaphoresis, fatigue, fever and unexpected weight change.       Chronic low grade raised temperature 2 years  Night sweats  HENT: Negative for congestion, hearing loss, sinus pressure, trouble swallowing and voice change.   Eyes: Negative for visual disturbance.  Respiratory: Negative for apnea, cough, chest tightness, shortness of breath and wheezing.   Cardiovascular: Negative for chest pain, palpitations and leg swelling.  Gastrointestinal: Negative for abdominal pain, constipation, diarrhea, nausea and vomiting.  Endocrine: Positive for polyuria. Negative for cold intolerance and polydipsia.  Genitourinary: Negative for dysuria, frequency and hematuria.  Musculoskeletal: Positive for arthralgias (bilateral hands/fingers, knees), back pain and joint swelling. Negative for gait problem and neck pain.  Skin: Negative for rash.  Allergic/Immunologic: Negative for environmental allergies.  Neurological:  Positive for numbness (intermittent left lateral thigh). Negative for dizziness, tremors, weakness, light-headedness and headaches (not active).  Hematological: Negative for adenopathy.  Psychiatric/Behavioral: Positive for sleep disturbance. Negative for behavioral problems, confusion, dysphoric mood, hallucinations, self-injury and suicidal ideas. The patient is nervous/anxious.    Per HPI unless specifically indicated above  Depression screen [facility] 2/9 09/26/2016  Decreased Interest 0  Down, Depressed, Hopeless 0  PHQ - 2 Score 0       Objective:    BP 134/72 (BP Location: Right Arm, Patient Position: Sitting, Cuff Size: Large)   Pulse 75   Temp 98.9 F (37.2 C) (Oral)   Resp 16   Ht '5\' 6"'$  (1.676 m)   Wt 253 lb (114.8 kg)   BMI 40.84 kg/m   Wt Readings from Last 3 Encounters:  09/26/16 253 lb (114.8 kg)  09/11/16 260 lb (117.9 kg)  06/28/16 238 lb (108 kg)    Physical Exam  Constitutional: She is oriented to person, place, and time. She appears well-developed and well-nourished. No distress.  Obese, well-appearing, comfortable, cooperative  HENT:  Head: Normocephalic and atraumatic.  Mouth/Throat: Oropharynx is clear and moist.  Eyes: Conjunctivae and EOM are normal. Pupils are equal, round, and reactive to light.  Neck: Normal range of motion. Neck supple. No thyromegaly present.  Cardiovascular: Normal rate, regular rhythm, normal heart sounds and intact distal pulses.   No murmur heard. No ectopy  Pulmonary/Chest: Effort normal and breath sounds normal. No respiratory distress. She has no wheezes. She has no rales.  Abdominal: Soft. Bowel sounds are normal. She exhibits no distension and no mass. There is no tenderness.  Musculoskeletal: Normal range of motion. She exhibits no edema or tenderness.  Back normal without deformity or abnormal curvature. - Mild discomfort midline low lumbar bilateral paraspinal muscles without significant spasm or hypertrophy - Seated SLR  with tightness in low back L>R without any radicular symptoms  Upper / Lower Extremities: - Normal muscle tone, strength bilateral upper extremities 5/5, lower extremities 5/5 - Bilateral hands and fingers without deformity, normal grip, no DIP / PIP nodules or joint hypertrophy, non-tender MCP and full ROM thumb. No edema, erythema or pain.  - Normal Gait  Bilateral Knees Inspection: Mild bilateral bulky appearance with mininal surrounding soft tissue edema without effusion, no erythema Palpation: Non-tender bilateral joint lines and patella. Mild crepitus bilateral. ROM: Full active ROM bilaterally Strength: 5/5 intact knee flex/ext, ankle dorsi/plantarflex Neurovascular: distally intact sensation light touch and pulses  Lymphadenopathy:    She has no cervical adenopathy.  Neurological: She is alert and oriented to person, place, and time.  Tinel positive bilateral ulnar nerve cubital tunnel. Negative carpal tunnel tinels  Skin: Skin is warm and dry. No rash noted. She is not diaphoretic.  Psychiatric:  Well groomed, good eye contact, anxious mood especially with several repeated questions regarding same concern, normal speech and thoughts, normal behavior  Nursing note and vitals reviewed.   I have personally reviewed radiology report from outside images from [facility]  08/30/2014 Left Knee X-ray CLINICAL INDICATION: 45 Year Old (F): 719.46 - Knee pain, unspecified laterality.  COMPARISON: None. TECHNIQUE: AP and sunrise views of both knees as well as lateral and tunnel views of the left knee. FINDINGS:  No fracture or dislocation. There is mild medial femorotibial narrowing with subchondral sclerosis. There is spurring of the medial tibial eminence. No erosions, chondrocalcinosis, subjacent edema, radiopaque foreign bodies, osteolytic or osteoblastic lesions. IMPRESSION: Mild osteoarthrosis of the left knee.  10/20/2013 MRI  Lumbar Spine Wo Contrast IMPRESSION: 1. Mild to moderate  multilevel degenerative changes as described, most pronounced at L4-5. 2. Predominantly low T2 signal throughout the uterine myometrium, which can be seen with adenomyosis in the proper clinical setting.     Assessment & Plan:   Problem List Items Addressed This Visit    Vitamin D deficiency   Spondylosis of lumbar region without myelopathy or radiculopathy    Suspected underlying etiology for chronic low back pain. No radiculopathy. No red flags. Reviewed prior Lumbar MRI 2014. Conservative treatment first, check labs for RA Follow-up      Severe anxiety with panic - Primary    Suspected primary etiology for large amount of her symptoms, assoc with panic disorder. Improved on current regimen, Fluvoxamine, Lamictal, Seroquel, and tapering down Clonazepam (was on chronic benzo >8 years) - Managed by Dr Amanda Webb [facility]iat[facility]), continue current Clonazepam taper as planned - Encouraged patient to consider seeking more local therapy for counseling - Follow-up as planned      Pre-diabetes    Elevated glucose on chart review, report of prior borderline diabetes or pre diabetes, unsure last A1c. Prior trial on metformin failed. - Check future A1c, lipids - Likely will need to re attempt Metformin, consider low dose vs XR      Relevant Orders   Hemoglobin A1c   Morbid obesity with BMI of 40.0-44.9, adult ([place])    Concern with weight gain, in setting of Pre-DM, prior dx HLD and HTN. Family history DM. - Encouraged regular exercise / lifestyle modifications - Check future A1c, Lipids, Vitamin D      Relevant Orders   Hemoglobin A1c   Lipid panel   Hypothyroidism    Stable, continue levothyroxine. Possibly related to report of elevated body temperature. Check TSH within next 2 months, follow-up adjustment as needed      Relevant Orders   TSH   HLD (hyperlipidemia)   Relevant Orders   Lipid panel   Depression with anxiety    Stable, on current  regimen Followed by Dr Amanda Webb [facility] in [place]      Chronic low back pain without sciatica    Stable, without active pain or flare. Reviewed prior Lumbar MRI 2014 See A&P under Arthritis multiple joints - Maximize conservative management follow-up      Arthritis, multiple joint involvement    Suspected multiple joint involvement of osteoarthritis with known DJD in left knee and lumbar spine. No significant joint injuries or problems, clinically without deformity or history to support concerns for rheumatoid arthritis, however given complaints with bilateral hands and small joint involvement with intermittent flares, will consider screening for  RA (given age 89s with worsening arthritis) - Recommend to maximize conservative therapy first with regular appropriate dose Tylenol, then NSAID PRN flares, heat / ice, activity, RICE therapy with compression, activity modification - Future orders for ESR / CRP and RF and anti-CCP to investigate possible RA      Relevant Orders   Rheumatoid factor   Cyclic citrul peptide antibody, IgG   Sed Rate (ESR)   C-reactive protein   Alteration of body temperature    Subjective elevated body temperature over 2 years, thought to be hormonal related (on Depo) and with night sweats. No other significant related symptoms. Previously advised peri-menopausal. - Patient scheduled to establish with [facility] next week, follow-up recommendations       Other Visit Diagnoses    Encounter to establish care with new doctor       Needs flu shot       Relevant Orders   Flu Vaccine QUAD 36+ mos IM (Completed)   Arthritis of hand       Relevant Orders   C-reactive protein   Recurrent joint pain       Relevant Orders   C-reactive protein      No orders of the defined types were placed in this encounter.     Follow up plan: Return in about 3 months (around 12/27/2016) for physical, labs, arthritis follow-up.  [name], [facility] 09/26/2016, 6:27 PM

## 2016-09-26 NOTE — Assessment & Plan Note (Signed)
Stable, on current regimen Followed by Dr Artist Beach [facility] in [place]

## 2016-09-26 NOTE — Patient Instructions (Signed)
Thank you for coming in to clinic today.  Call GYN office to ask status of your referral and if you can schedule appointment  [facility] [address] [place], [place] [address] Hours: 8am - 5pm Main: [place], MD Alanda Slim. DeFrancesco, MD Melody Ronney Asters), RN CNM, midwife  Please request release of records from [facility].  If you develop some general arthritis joint aches - Start with Tylenol Ext Str 500mg  tabs - take 1 to 2 (max dose 1000mg ) every 8 hours or 3 times a day (up to 3000mg  daily or 6 pills in 24 hours), may take 1-2 weeks of regular use for better effect. - If worsening pain you can try Ibuprofen 400-600mg  every 6 hours or 3 times a day with food, do this regularly for up to 1-2 weeks then stop to avoid problems  Use heating pad for low back Use knee sleeve or compression / ACE wrap and ice packs for knees Stay active to keep joint mobility  1 to 2 weeks before your next appointment - Please schedule a "Lab Only" visit (early morning, 8:00am to 9:00am) to get your blood work drawn here at our clinic. - You need to be fasting (No food or drink after midnight, and nothing in the morning before your blood draw). - I have already ordered your blood work, so you may schedule this appointment at your convenience  For Lab Results, once available within next 2-3 days, you can log in to [facility] to view your results and a brief message with explanations  Please schedule a follow-up appointment with Dr. Parks Ranger within 3 months to follow-up Arthritis, Lab results  If you have any other questions or concerns, please feel free to call the clinic or send a message through [facility]. You may also schedule an earlier appointment if necessary.  [name], DO [place]

## 2016-09-26 NOTE — Assessment & Plan Note (Signed)
Suspected primary etiology for large amount of her symptoms, assoc with panic disorder. Improved on current regimen, Fluvoxamine, Lamictal, Seroquel, and tapering down Clonazepam (was on chronic benzo >8 years) - Managed by Dr Artist Beach [facility]), continue current Clonazepam taper as planned - Encouraged patient to consider seeking more local therapy for counseling - Follow-up as planned

## 2016-09-26 NOTE — Assessment & Plan Note (Signed)
Suspected multiple joint involvement of osteoarthritis with known DJD in left knee and lumbar spine. No significant joint injuries or problems, clinically without deformity or history to support concerns for rheumatoid arthritis, however given complaints with bilateral hands and small joint involvement with intermittent flares, will consider screening for RA (given age 86s with worsening arthritis) - Recommend to maximize conservative therapy first with regular appropriate dose Tylenol, then NSAID PRN flares, heat / ice, activity, RICE therapy with compression, activity modification - Future orders for ESR / CRP and RF and anti-CCP to investigate possible RA

## 2016-09-26 NOTE — Assessment & Plan Note (Signed)
Stable, continue levothyroxine. Possibly related to report of elevated body temperature. Check TSH within next 2 months, follow-up adjustment as needed

## 2016-09-26 NOTE — Assessment & Plan Note (Signed)
Elevated glucose on chart review, report of prior borderline diabetes or pre diabetes, unsure last A1c. Prior trial on metformin failed. - Check future A1c, lipids - Likely will need to re attempt Metformin, consider low dose vs XR

## 2016-09-26 NOTE — Assessment & Plan Note (Signed)
Concern with weight gain, in setting of Pre-DM, prior dx HLD and HTN. Family history DM. - Encouraged regular exercise / lifestyle modifications - Check future A1c, Lipids, Vitamin D

## 2016-09-26 NOTE — Assessment & Plan Note (Signed)
Suspected underlying etiology for chronic low back pain. No radiculopathy. No red flags. Reviewed prior Lumbar MRI 2014. Conservative treatment first, check labs for RA Follow-up

## 2016-10-02 ENCOUNTER — Ambulatory Visit (INDEPENDENT_AMBULATORY_CARE_PROVIDER_SITE_OTHER): Payer: Medicare Other | Admitting: Obstetrics and Gynecology

## 2016-10-02 ENCOUNTER — Encounter: Payer: Self-pay | Admitting: Obstetrics and Gynecology

## 2016-10-02 ENCOUNTER — Other Ambulatory Visit: Payer: Self-pay | Admitting: Obstetrics and Gynecology

## 2016-10-02 VITALS — BP 115/74 | HR 75 | Ht 65.0 in | Wt 253.7 lb

## 2016-10-02 DIAGNOSIS — E6609 Other obesity due to excess calories: Secondary | ICD-10-CM

## 2016-10-02 DIAGNOSIS — E039 Hypothyroidism, unspecified: Secondary | ICD-10-CM

## 2016-10-02 DIAGNOSIS — R232 Flushing: Secondary | ICD-10-CM | POA: Diagnosis not present

## 2016-10-02 DIAGNOSIS — Z8041 Family history of malignant neoplasm of ovary: Secondary | ICD-10-CM

## 2016-10-02 DIAGNOSIS — N393 Stress incontinence (female) (male): Secondary | ICD-10-CM

## 2016-10-02 DIAGNOSIS — N912 Amenorrhea, unspecified: Secondary | ICD-10-CM | POA: Diagnosis not present

## 2016-10-02 DIAGNOSIS — Z6841 Body Mass Index (BMI) 40.0 and over, adult: Secondary | ICD-10-CM

## 2016-10-02 DIAGNOSIS — Z720 Tobacco use: Secondary | ICD-10-CM | POA: Diagnosis not present

## 2016-10-02 DIAGNOSIS — IMO0001 Reserved for inherently not codable concepts without codable children: Secondary | ICD-10-CM

## 2016-10-02 DIAGNOSIS — Z8481 Family history of carrier of genetic disease: Secondary | ICD-10-CM | POA: Diagnosis not present

## 2016-10-02 NOTE — Progress Notes (Signed)
GYN ENCOUNTER NOTE  Subjective:       Amanda Webb is a 45 y.o. [id number] female is here for gynecologic evaluation of the following issues:  1. Hot flashes 2. Stress urinary incontinence 3. Amenorrhea. 4. Cramps (menstrual) 5. Low-grade fevers  45 year old white female para 89, on Depo-Provera 4 years, with amenorrhea,presents for evaluation in referral from urology  Hot flashes: Patient reports cold night sweats for 2.5 years;she reports menstrual type cramps without vaginal bleeding. She also has been experiencing low-grade fevers less than 100.91F for the past 2-1/2 years.  Stress urinary incontinence: Patient has been evaluated by urology physical therapy consult has been ordered.  GU symptoms: Frequency: 5 per day Nocturia: None Urgency: Mild Stress urinary incontinence: Positive; wears pads  GI symptoms: Bowel movements-daily No splinting  co morbidities include obesity, hypertension, prediabetes, tobacco user.       Gynecologic History No LMP recorded. Patient has had an injection. Contraception: Depo-Provera injections Last Pap: normal History of cervical dysplasia; status post cryosurgery 1998; no abnormalities since Family history of ovarian cancer in maternal grandmother  Obstetric History OB History  Gravida Para Term Preterm AB Living  _0 SAB TAB Ectopic Multiple Live Births               # Outcome Date GA Lbr Len/2nd Weight Sex Delivery Anes PTL Lv  5 Term           4 Term           3 Term           2 Term           1 AB             Obstetric Comments  a    Past Medical History:  Diagnosis Date  . Allergy   . Anxiety   . Arrhythmia   . Arthritis   . Cystitis   . Depression   . GERD (gastroesophageal reflux disease)   . Gross hematuria   . Heart murmur   . HLD (hyperlipidemia)   . Hypertension   . Hypothyroid   . Over weight   . Tobacco abuse     Past Surgical History:  Procedure Laterality Date  . CARPAL TUNNEL  RELEASE Bilateral 2011  . FOOT SURGERY Right 2013   Plantar fascia  . TONSILLECTOMY  1979    Current Outpatient Prescriptions on File Prior to Visit  Medication Sig Dispense Refill  . atenolol (TENORMIN) 50 MG tablet Take 50 mg by mouth daily.    . clonazePAM (KLONOPIN) 1 MG tablet Take 0.5 mg by mouth daily.     Marland Kitchen EPINEPHrine (EPIPEN 2-PAK) 0.3 mg/0.3 mL IJ SOAJ injection Inject 0.3 mLs (0.3 mg total) into the muscle once. 1 Device 0  . fluticasone (FLONASE) 50 MCG/ACT nasal spray Place 2 sprays into both nostrils daily.    . fluvoxaMINE (LUVOX) 100 MG tablet Take 150 mg by mouth 2 (two) times daily.    Marland Kitchen ketoconazole (NIZORAL) 2 % cream Apply 1 application topically daily as needed for irritation.    Marland Kitchen ketoconazole (NIZORAL) 2 % shampoo Apply 1 application topically 2 (two) times a week.    . lamoTRIgine (LAMICTAL) 200 MG tablet Take 200 mg by mouth daily.    Marland Kitchen levothyroxine (SYNTHROID, LEVOTHROID) 50 MCG tablet Take 50 mcg by mouth daily before breakfast.    . pantoprazole (PROTONIX) 20 MG tablet Take 20 mg by mouth daily.    Marland Kitchen  QUEtiapine (SEROQUEL) 50 MG tablet Take 50 mg by mouth 3 (three) times daily.     No current facility-administered medications on file prior to visit.     Allergies  Allergen Reactions  . Bee Venom Anaphylaxis, Hives and Swelling    Carries Epi pen.  . Cat Hair Extract Itching, Other (See Comments) and Swelling    Allergic to trees, nuts, wheat, grass, cats & dogs - itchy watery eyes, swelling. Uses Zyrtec & Flonase & Benadryl if really bad. Used to get allergy shots.  . Tetracyclines & Related Nausea And Vomiting  . Lac Bovis Diarrhea and Nausea And Vomiting  . Lactose Diarrhea and Nausea And Vomiting  . Tape Other (See Comments) and Rash    Needs to use paper tape. Breaks out with severe rash, pulls skin off when using adhesive.    Social History   Social History  . Marital status: Divorced    Spouse name: N/A  . Number of children: N/A  . Years  of education: Some college   Occupational History  . Not on file.   Social History Main Topics  . Smoking status: Current Every Day Smoker    Packs/day: 1.00    Years: 30.00    Types: Cigarettes  . Smokeless tobacco: Never Used  . Alcohol use 0.0 oz/week     Comment: Monthly, 3 drinks  . Drug use: No  . Sexual activity: Not Currently   Other Topics Concern  . Not on file   Social History Narrative  . No narrative on file    Family History  Problem Relation Age of Onset  . Depression Mother   . Mental illness Mother   . Alcohol abuse Mother   . Cancer Maternal Grandmother   . Diabetes Maternal Grandmother   . Kidney disease Maternal Grandfather   . Diabetes Father   . Mental illness Father   . Depression Father   . Drug abuse Father   . Depression Paternal Grandfather   . Drug abuse Paternal Grandfather   . Bladder Cancer Neg Hx     The following portions of the patient's history were reviewed and updated as appropriate: allergies, current medications, past family history, past medical history, past social history, past surgical history and problem list.  Review of Systems Review of Systems -per history of present illness Objective:   BP 115/74   Pulse 75   Ht '5\' 5"'$  (1.651 m)   Wt 253 lb 11.2 oz (115.1 kg)   BMI 42.22 kg/m  CONSTITUTIONAL: Well-developed, well-nourished female in no acute distress.  HENT:  Normocephalic, atraumatic.  NECK: Normal range of motion, supple, no masses.  Normal thyroid.  SKIN: Skin is warm and dry. No rash noted. Not diaphoretic. No erythema. No pallor. [place]: Alert and oriented to person, place, and time. PSYCHIATRIC: Normal mood and affect. Normal behavior. Normal judgment and thought content. CARDIOVASCULAR:Not Examined RESPIRATORY: Not Examined BREASTS: Not Examined ABDOMEN: Soft, non distended; Non tender.  No Organomegaly. PELVIC:  External Genitalia: Normal  BUS: Normal  Vagina: first to second-degree cystocele  with Valsalva; rotational descent of the bladder. No rectocele  Cervix: Normal  Uterus: Retroverted, slightly tender 1/4, mobile  Adnexa: Normal nonpalpable and nontender  RV: Normal external exam  Bladder: Nontender MUSCULOSKELETAL: Normal range of motion. No tenderness.  No cyanosis, clubbing, or edema.     Assessment:   1. Hot flashes,unclear etiology (differential: Menopause, hypothyroidism, depression, obesity, tobacco use)  2. SUI (stress urinary incontinence, female)  3.  Class 3 obesity due to excess calories without serious comorbidity with body mass index (BMI) of 40.0 to 44.9 in adult ([place])  4. Amenorrhea due to Depo Provera  5. Tobacco user  6. Hypothyroidism, unspecified type  7. Family history of ovarian cancer     Plan:   1. Stop Depo-Provera 2. Return in 6 months for follow-up  3. Pelvic ultrasound to assess uterus and adnexa 4. BRCA1/BRCA2 testing 5. Smoking cessation encouraged 6. Return if onset of menses is heavy  Brayton Mars, MD  Note: This dictation was prepared with Dragon dictation along with smaller phrase technology. Any transcriptional errors that result from this process are unintentional.

## 2016-10-02 NOTE — Patient Instructions (Addendum)
Perimenopause Perimenopause is the time when your body begins to move into the menopause (no menstrual period for 12 straight months). It is a natural process. Perimenopause can begin 2-8 years before the menopause and usually lasts for 1 year after the menopause. During this time, your ovaries may or may not produce an egg. The ovaries vary in their production of estrogen and progesterone hormones each month. This can cause irregular menstrual periods, difficulty getting pregnant, vaginal bleeding between periods, and uncomfortable symptoms. CAUSES  Irregular production of the ovarian hormones, estrogen and progesterone, and not ovulating every month.  Other causes include:  Tumor of the pituitary gland in the brain.  Medical disease that affects the ovaries.  Radiation treatment.  Chemotherapy.  Unknown causes.  Heavy smoking and excessive alcohol intake can bring on perimenopause sooner. SIGNS AND SYMPTOMS   Hot flashes.  Night sweats.  Irregular menstrual periods.  Decreased sex drive.  Vaginal dryness.  Headaches.  Mood swings.  Depression.  Memory problems.  Irritability.  Tiredness.  Weight gain.  Trouble getting pregnant.  The beginning of losing bone cells (osteoporosis).  The beginning of hardening of the arteries (atherosclerosis). DIAGNOSIS  Your health care provider will make a diagnosis by analyzing your age, menstrual history, and symptoms. He or she will do a physical exam and note any changes in your body, especially your female organs. Female hormone tests may or may not be helpful depending on the amount of female hormones you produce and when you produce them. However, other hormone tests may be helpful to rule out other problems. TREATMENT  In some cases, no treatment is needed. The decision on whether treatment is necessary during the perimenopause should be made by you and your health care provider based on how the symptoms are affecting you  and your lifestyle. Various treatments are available, such as:  Treating individual symptoms with a specific medicine for that symptom.  Herbal medicines that can help specific symptoms.  Counseling.  Group therapy. HOME CARE INSTRUCTIONS   Keep track of your menstrual periods (when they occur, how heavy they are, how long between periods, and how long they last) as well as your symptoms and when they started.  Only take over-the-counter or prescription medicines as directed by your health care provider.  Sleep and rest.  Exercise.  Eat a diet that contains calcium (good for your bones) and soy (acts like the estrogen hormone).  Do not smoke.  Avoid alcoholic beverages.  Take vitamin supplements as recommended by your health care provider. Taking vitamin E may help in certain cases.  Take calcium and vitamin D supplements to help prevent bone loss.  Group therapy is sometimes helpful.  Acupuncture may help in some cases. SEEK MEDICAL CARE IF:   You have questions about any symptoms you are having.  You need a referral to a specialist (gynecologist, psychiatrist, or psychologist). SEEK IMMEDIATE MEDICAL CARE IF:   You have vaginal bleeding.  Your period lasts longer than 8 days.  Your periods are recurring sooner than 21 days.  You have bleeding after intercourse.  You have severe depression.  You have pain when you urinate.  You have severe headaches.  You have vision problems.   This information is not intended to replace advice given to you by your health care provider. Make sure you discuss any questions you have with your health care provider.   Document Released: 01/10/2005 Document Revised: 12/24/2014 Document Reviewed: 07/02/2013 Elsevier Interactive Patient Education [employer].  Burch Procedure for Stress Incontinence The Burch procedure is surgery to treat the loss of bladder control (urinary incontinence) in women. Women with urinary  incontinence often have urine loss while straining, coughing, sneezing, or laughing. Urinary incontinence may occur because of the bladder and urethra changing position through the course of normal aging or childbirth.  The goal of the Burch procedure is to improve bladder function by providing more support for the urethra and bladder and by restoring them to their normal position.  LET [facility] CARE PROVIDER KNOW ABOUT:  Any allergies you have.  All medicines you are taking, including vitamins, herbs, eye drops, creams, and over-the-counter medicines.  Previous problems you or members of your family have had with the use of anesthetics.  Any blood disorders you have.  Previous surgeries you have had.  Medical conditions you have. RISKS AND COMPLICATIONS Generally, this is a safe procedure. However, as with any procedure, complications can occur. Possible complications include:  Bleeding.  Infection.  Injury to the bladder, urethra, or surrounding organs.  Problems related to the use of anesthetics.  You may have difficulty urinating or may have leaking of urine again. BEFORE THE PROCEDURE  Various tests may need to be done before the day of surgery, such as:   Blood tests.   X-rays.   Ultrasonography.   Bladder and urinating tests (urodynamic testing).   Cystoscopy. This is a test to look into your bladder using a small metal scope with a light.   Ask your health care provider about changing or stopping your regular medicines. You may need to stop taking certain medicines, such as aspirin or blood thinners, at least a week before your surgery.   Do not eat or drink anything for 8 hours before your surgery.   If you smoke, do not smoke for at least 2 weeks before surgery. Smokers do not heal as well and tend to have more breathing problems during and after surgery.  Make plans to have someone drive you home after your hospital stay. Also arrange for someone  to help you with activities during recovery.  PROCEDURE   You will be given a medicine that makes you go to sleep (general anesthetic) or a medicine injected into your spine that numbs your body below the waist (spinal anesthetic).  The surgeon may use either a laparoscopic or open technique for this surgery:  In the laparoscopic technique, the surgery is done through small cuts (incisions) in the abdomen and groin area. A thin, lighted tube with a tiny camera on the end (laparoscope) is inserted into one of the incisions. The tools needed for the procedure are put through the other incisions. This technique allows for a faster recovery time.  In the open technique, the surgery is done through one large incision in the abdomen.  Using either technique, the surgeon lifts the wall of the vagina where the urethra is located. The vaginal wall is stitched (sutured) to tissue near the pubic bone. This corrects the weakness so that the bladder remains stable during activities that might cause leakage, such as coughing or sneezing.  The incision or incisions will be closed with stitches. AFTER THE PROCEDURE  You will be taken to a recovery area where your progress will be monitored. Once you are awake and stable, you will likely be moved to a regular hospital room.  You may have a thin, flexible tube (catheter) in your bladder to drain urine. This may stay in place until your  bladder is working properly on its own. The catheter may be removed before you are discharged, or it may stay in place when you go home.  You may have gauze or bandages (dressings) in the vagina. This will be removed in 1-2 days.  Depending on your condition, you may still have a catheter in place when you go home.  If the laparoscopic technique was used, you may be allowed to go home after several hours, or you may need to stay in the hospital overnight. If the open technique was used, you will likely need to stay in the  hospital for a couple days.   This information is not intended to replace advice given to you by your health care provider. Make sure you discuss any questions you have with your health care provider.   Document Released: 02/23/2004 Document Revised: 08/05/2013 Document Reviewed: 06/05/2013 Elsevier Interactive Patient Education [employer].

## 2016-10-15 ENCOUNTER — Encounter: Payer: Self-pay | Admitting: Obstetrics and Gynecology

## 2016-10-18 DIAGNOSIS — Z79899 Other long term (current) drug therapy: Secondary | ICD-10-CM | POA: Diagnosis not present

## 2016-10-18 DIAGNOSIS — F4310 Post-traumatic stress disorder, unspecified: Secondary | ICD-10-CM | POA: Diagnosis not present

## 2016-10-26 ENCOUNTER — Encounter: Payer: Self-pay | Admitting: Obstetrics and Gynecology

## 2016-10-29 ENCOUNTER — Encounter: Payer: Self-pay | Admitting: Intensive Care

## 2016-10-29 ENCOUNTER — Emergency Department
Admission: EM | Admit: 2016-10-29 | Discharge: 2016-10-29 | Disposition: A | Discharge status: Home / Self Care | Payer: Medicare Other | Attending: Emergency Medicine | Admitting: Emergency Medicine

## 2016-10-29 DIAGNOSIS — Z79899 Other long term (current) drug therapy: Secondary | ICD-10-CM | POA: Insufficient documentation

## 2016-10-29 DIAGNOSIS — I10 Essential (primary) hypertension: Secondary | ICD-10-CM | POA: Insufficient documentation

## 2016-10-29 DIAGNOSIS — E039 Hypothyroidism, unspecified: Secondary | ICD-10-CM | POA: Insufficient documentation

## 2016-10-29 DIAGNOSIS — R112 Nausea with vomiting, unspecified: Secondary | ICD-10-CM | POA: Diagnosis not present

## 2016-10-29 DIAGNOSIS — F17210 Nicotine dependence, cigarettes, uncomplicated: Secondary | ICD-10-CM | POA: Diagnosis not present

## 2016-10-29 DIAGNOSIS — R197 Diarrhea, unspecified: Secondary | ICD-10-CM | POA: Diagnosis not present

## 2016-10-29 LAB — COMPREHENSIVE METABOLIC PANEL
ALT: 21 U/L (ref 14–54)
AST: 27 U/L (ref 15–41)
Albumin: 4 g/dL (ref 3.5–5.0)
Alkaline Phosphatase: 87 U/L (ref 38–126)
Anion gap: 9 (ref 5–15)
BUN: 7 mg/dL (ref 6–20)
CO2: 21 mmol/L — ABNORMAL LOW (ref 22–32)
Calcium: 8.6 mg/dL — ABNORMAL LOW (ref 8.9–10.3)
Chloride: 106 mmol/L (ref 101–111)
Creatinine, Ser: 0.7 mg/dL (ref 0.44–1.00)
GFR calc Af Amer: >60 mL/min (ref >60)
GFR calc non Af Amer: >60 mL/min (ref >60)
Glucose, Bld: 113 mg/dL — ABNORMAL HIGH (ref 65–99)
Potassium: 3.2 mmol/L — ABNORMAL LOW (ref 3.5–5.1)
Sodium: 136 mmol/L (ref 135–145)
Total Bilirubin: 0.5 mg/dL (ref 0.3–1.2)
Total Protein: 7.3 g/dL (ref 6.5–8.1)

## 2016-10-29 LAB — URINALYSIS COMPLETE WITH MICROSCOPIC (ARMC ONLY)
Bacteria, UA: NONE SEEN
Bilirubin Urine: NEGATIVE
Glucose, UA: NEGATIVE mg/dL
Ketones, ur: NEGATIVE mg/dL
Leukocytes, UA: NEGATIVE
Nitrite: NEGATIVE
Protein, ur: NEGATIVE mg/dL
RBC / HPF: NONE SEEN RBC/hpf (ref 0–5)
Specific Gravity, Urine: 1.001 — ABNORMAL LOW (ref 1.005–1.030)
pH: 6 (ref 5.0–8.0)

## 2016-10-29 LAB — CBC
HCT: 39.7 % (ref 35.0–47.0)
Hemoglobin: 13.7 g/dL (ref 12.0–16.0)
MCH: 29.4 pg (ref 26.0–34.0)
MCHC: 34.6 g/dL (ref 32.0–36.0)
MCV: 85 fL (ref 80.0–100.0)
Platelets: 222 10*3/uL (ref 150–440)
RBC: 4.67 MIL/uL (ref 3.80–5.20)
RDW: 14.6 % — ABNORMAL HIGH (ref 11.5–14.5)
WBC: 6.6 10*3/uL (ref 3.6–11.0)

## 2016-10-29 LAB — VISTASEQ HERED. CANCER PANEL

## 2016-10-29 LAB — LIPASE, BLOOD: Lipase: 26 U/L (ref 11–51)

## 2016-10-29 MED ORDER — SODIUM CHLORIDE 0.9 % IV BOLUS (SEPSIS)
1000.0000 mL | Freq: Once | INTRAVENOUS | Status: AC
  Administered 2016-10-29: 1000 mL via INTRAVENOUS

## 2016-10-29 MED ORDER — ONDANSETRON HCL 4 MG PO TABS: 4.0000 mg | ORAL_TABLET | Freq: Three times a day (TID) | ORAL | 0 refills | Status: DC | PRN

## 2016-10-29 MED ORDER — POTASSIUM CHLORIDE CRYS ER 20 MEQ PO TBCR
40.0000 meq | EXTENDED_RELEASE_TABLET | Freq: Once | ORAL | Status: AC
  Administered 2016-10-29: 40 meq via ORAL
  Filled 2016-10-29: qty 2
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
## 2016-10-29 NOTE — ED Triage Notes (Signed)
Patient reports she started feeling nauseas on Thursday and having diarrhea. She reports not being able to eat or drink liquids without passing it back out. Pt A&O x4.

## 2016-10-30 ENCOUNTER — Ambulatory Visit: Payer: Medicare Other | Admitting: Family Medicine

## 2016-10-31 ENCOUNTER — Emergency Department
Admission: EM | Admit: 2016-10-31 | Discharge: 2016-10-31 | Disposition: A | Discharge status: Home / Self Care | Payer: Medicare Other | Attending: Emergency Medicine | Admitting: Emergency Medicine

## 2016-10-31 ENCOUNTER — Ambulatory Visit (INDEPENDENT_AMBULATORY_CARE_PROVIDER_SITE_OTHER): Payer: Medicare Other | Admitting: Family Medicine

## 2016-10-31 ENCOUNTER — Encounter: Payer: Self-pay | Admitting: *Deleted

## 2016-10-31 ENCOUNTER — Encounter: Payer: Self-pay | Admitting: Family Medicine

## 2016-10-31 VITALS — BP 101/63 | HR 106 | Temp 100.1°F | Resp 16 | Ht 65.0 in | Wt 246.6 lb

## 2016-10-31 DIAGNOSIS — K529 Noninfective gastroenteritis and colitis, unspecified: Secondary | ICD-10-CM

## 2016-10-31 DIAGNOSIS — A084 Viral intestinal infection, unspecified: Secondary | ICD-10-CM | POA: Diagnosis not present

## 2016-10-31 DIAGNOSIS — E860 Dehydration: Secondary | ICD-10-CM | POA: Diagnosis not present

## 2016-10-31 DIAGNOSIS — I10 Essential (primary) hypertension: Secondary | ICD-10-CM | POA: Diagnosis not present

## 2016-10-31 DIAGNOSIS — R197 Diarrhea, unspecified: Secondary | ICD-10-CM | POA: Diagnosis present

## 2016-10-31 DIAGNOSIS — E039 Hypothyroidism, unspecified: Secondary | ICD-10-CM | POA: Insufficient documentation

## 2016-10-31 DIAGNOSIS — Z79899 Other long term (current) drug therapy: Secondary | ICD-10-CM | POA: Diagnosis not present

## 2016-10-31 DIAGNOSIS — F17210 Nicotine dependence, cigarettes, uncomplicated: Secondary | ICD-10-CM | POA: Diagnosis not present

## 2016-10-31 LAB — COMPREHENSIVE METABOLIC PANEL WITH GFR
ALT: 67 U/L — ABNORMAL HIGH (ref 14–54)
AST: 79 U/L — ABNORMAL HIGH (ref 15–41)
Albumin: 4.3 g/dL (ref 3.5–5.0)
Alkaline Phosphatase: 76 U/L (ref 38–126)
Anion gap: 12 (ref 5–15)
BUN: <5 mg/dL — ABNORMAL LOW (ref 6–20)
CO2: 16 mmol/L — ABNORMAL LOW (ref 22–32)
Calcium: 9.1 mg/dL (ref 8.9–10.3)
Chloride: 110 mmol/L (ref 101–111)
Creatinine, Ser: 0.74 mg/dL (ref 0.44–1.00)
GFR calc Af Amer: >60 mL/min
GFR calc non Af Amer: >60 mL/min
Glucose, Bld: 88 mg/dL (ref 65–99)
Potassium: 3.1 mmol/L — ABNORMAL LOW (ref 3.5–5.1)
Sodium: 138 mmol/L (ref 135–145)
Total Bilirubin: 0.6 mg/dL (ref 0.3–1.2)
Total Protein: 7.7 g/dL (ref 6.5–8.1)

## 2016-10-31 LAB — URINALYSIS COMPLETE WITH MICROSCOPIC (ARMC ONLY)
Bacteria, UA: NONE SEEN
Bilirubin Urine: NEGATIVE
Glucose, UA: NEGATIVE mg/dL
Leukocytes, UA: NEGATIVE
Nitrite: NEGATIVE
Protein, ur: NEGATIVE mg/dL
Specific Gravity, Urine: 1.003 — ABNORMAL LOW (ref 1.005–1.030)
pH: 6 (ref 5.0–8.0)

## 2016-10-31 LAB — CBC
HCT: 40.9 % (ref 35.0–47.0)
Hemoglobin: 14.2 g/dL (ref 12.0–16.0)
MCH: 29.7 pg (ref 26.0–34.0)
MCHC: 34.8 g/dL (ref 32.0–36.0)
MCV: 85.5 fL (ref 80.0–100.0)
Platelets: 216 K/uL (ref 150–440)
RBC: 4.78 MIL/uL (ref 3.80–5.20)
RDW: 14.7 % — ABNORMAL HIGH (ref 11.5–14.5)
WBC: 8.4 K/uL (ref 3.6–11.0)

## 2016-10-31 LAB — LIPASE, BLOOD: Lipase: 24 U/L (ref 11–51)

## 2016-10-31 MED ORDER — LOPERAMIDE HCL 2 MG PO TABS: 2.0000 mg | ORAL_TABLET | Freq: Four times a day (QID) | ORAL | 0 refills | Status: DC | PRN

## 2016-10-31 MED ORDER — ONDANSETRON 4 MG PO TBDP: 4.0000 mg | ORAL_TABLET | Freq: Three times a day (TID) | ORAL | 0 refills | Status: DC | PRN

## 2016-10-31 MED ORDER — SODIUM CHLORIDE 0.9 % IV BOLUS (SEPSIS)
1000.0000 mL | Freq: Once | INTRAVENOUS | Status: AC
  Administered 2016-10-31: 1000 mL via INTRAVENOUS

## 2016-10-31 MED ORDER — ONDANSETRON HCL 4 MG/2ML IJ SOLN
4.0000 mg | Freq: Once | INTRAMUSCULAR | Status: AC
  Administered 2016-10-31: 4 mg via INTRAVENOUS
  Filled 2016-10-31: qty 2

## 2016-10-31 MED ORDER — LOPERAMIDE HCL 2 MG PO CAPS
4.0000 mg | ORAL_CAPSULE | Freq: Once | ORAL | Status: AC
  Administered 2016-10-31: 4 mg via ORAL
  Filled 2016-10-31: qty 2

## 2016-10-31 NOTE — Progress Notes (Signed)
Subjective:    Patient ID: Amanda Webb, female    DOB: 03-09-1971, 45 y.o.   MRN: [id number]  Amanda Webb is a 45 y.o. female presenting on 10/31/2016 for Hospitalization Follow-up (still has sickness nausea also has diarrhea onset 5 days)   HPI   DIARRHEA / NAUSEA, VOMITING: - Recent ED visit 11/13 for viral gastroenteritis, received some IV fluids, had labs and UA done, mild low K, given potassium, rx Zofran, discharged. Patient states that symptoms have been present for >5 days now with worsening and no improvement. Has not been able to tolerate solids, trying to stay hydrated drinking water, gatorade, admits frequent diarrhea with some watery stools >10 episodes per day, nausea with occasional vomiting (NBNB), taking Zofran 4mg  with some relief but tries to not to vomit pill up. States still voiding well, clear urine.  - Admits occasional cramping but no abdominal pain, has fecal incontinence - Sick contact with grandson strep throat and viral symptoms - Admits subjective fever with some chills but no measured temp - Denies any active abdominal pain, abdominal distention, rectal bleeding, dark stools, hematemesis, productive cough, sinus pain or pressure  Social History  Substance Use Topics  . Smoking status: Current Every Day Smoker    Packs/day: 0.50    Years: 30.00    Types: Cigarettes  . Smokeless tobacco: Current User  . Alcohol use No    Review of Systems Per HPI unless specifically indicated above     Objective:    BP 101/63   Pulse (!) 106   Temp 100.1 F (37.8 C) (Oral)   Resp 16   Ht 5\' 5"  (1.651 m)   Wt 246 lb 9.6 oz (111.9 kg)   BMI 41.04 kg/m   Wt Readings from Last 3 Encounters:  10/31/16 243 lb (110.2 kg)  10/31/16 246 lb 9.6 oz (111.9 kg)  10/29/16 253 lb (114.8 kg)    Physical Exam  Constitutional: She appears well-developed and well-nourished. No distress.  Appears tired and ill but non toxic, mostly comfortable, cooperative    HENT:  Head: Normocephalic and atraumatic.  Mild dry mucus membranes. Oropharynx with mild generalized posterior pharynx erythema and irritation without exudate, edema, or asymmetry. Bilateral TMs clear  Eyes: Conjunctivae are normal.  Neck: Normal range of motion. Neck supple.  Cardiovascular: Regular rhythm, normal heart sounds and intact distal pulses.   No murmur heard. Tachycardic  Pulmonary/Chest: Effort normal and breath sounds normal. No respiratory distress. She has no wheezes. She has no rales.  Abdominal: Soft. Bowel sounds are normal. She exhibits no distension and no mass. There is no tenderness. There is no rebound.  Musculoskeletal: She exhibits no edema.  Lymphadenopathy:    She has no cervical adenopathy.  Neurological: She is alert.  Skin: Skin is warm and dry. She is not diaphoretic.  Nursing note and vitals reviewed.    I have personally reviewed the following lab results from 10/29/16.  Results for orders placed or performed during the hospital encounter of 10/29/16  Lipase, blood  Result Value Ref Range   Lipase 26 11 - 51 U/L  Comprehensive metabolic panel  Result Value Ref Range   Sodium 136 135 - 145 mmol/L   Potassium 3.2 (L) 3.5 - 5.1 mmol/L   Chloride 106 101 - 111 mmol/L   CO2 21 (L) 22 - 32 mmol/L   Glucose, Bld 113 (H) 65 - 99 mg/dL   BUN 7 6 - 20 mg/dL   Creatinine, Ser  0.70 0.44 - 1.00 mg/dL   Calcium 8.6 (L) 8.9 - 10.3 mg/dL   Total Protein 7.3 6.5 - 8.1 g/dL   Albumin 4.0 3.5 - 5.0 g/dL   AST 27 15 - 41 U/L   ALT 21 14 - 54 U/L   Alkaline Phosphatase 87 38 - 126 U/L   Total Bilirubin 0.5 0.3 - 1.2 mg/dL   GFR calc non Af Amer >60 >60 mL/min   GFR calc Af Amer >60 >60 mL/min   Anion gap 9 5 - 15  CBC  Result Value Ref Range   WBC 6.6 3.6 - 11.0 K/uL   RBC 4.67 3.80 - 5.20 MIL/uL   Hemoglobin 13.7 12.0 - 16.0 g/dL   HCT 39.7 35.0 - 47.0 %   MCV 85.0 80.0 - 100.0 fL   MCH 29.4 26.0 - 34.0 pg   MCHC 34.6 32.0 - 36.0 g/dL   RDW 14.6  (H) 11.5 - 14.5 %   Platelets 222 150 - 440 K/uL  Urinalysis complete, with microscopic  Result Value Ref Range   Color, Urine COLORLESS (A) YELLOW   APPearance CLEAR (A) CLEAR   Glucose, UA NEGATIVE NEGATIVE mg/dL   Bilirubin Urine NEGATIVE NEGATIVE   Ketones, ur NEGATIVE NEGATIVE mg/dL   Specific Gravity, Urine 1.001 (L) 1.005 - 1.030   Hgb urine dipstick 1+ (A) NEGATIVE   pH 6.0 5.0 - 8.0   Protein, ur NEGATIVE NEGATIVE mg/dL   Nitrite NEGATIVE NEGATIVE   Leukocytes, UA NEGATIVE NEGATIVE   RBC / HPF NONE SEEN 0 - 5 RBC/hpf   WBC, UA 0-5 0 - 5 WBC/hpf   Bacteria, UA NONE SEEN NONE SEEN   Squamous Epithelial / LPF 0-5 (A) NONE SEEN      Assessment & Plan:   Problem List Items Addressed This Visit    Viral gastroenteritis - Primary    Consistent with viral gastroenteritis, >5 days now worsening diarrhea, nausea, limited PO, tolerating liquids. Known sick contacts. Clinically mild dehydration, tachycardic, benign abdomen on exam - Concern given lack of improvement s/p recent ED visit, symptoms worsening with some dehydration  Plan: 1. Given rx Zofran ODT as trial instead of Zofran pills to avoid pill coming up if still vomiting 2. Counseled on oral rehydration 3. May try OTC Loperamide as needed for diarrhea limited to only few days if diarrhea worsens or excessive, otherwise should not continue regularly in setting of viral gastro, as it is likely to be self limited 4. Tylenol / Ibuprofen PRN fever 5. Recommend go directly to ED for further evaluation if continues to worsen, suspect would be candidate for IVF rehydration and re-eval       Other Visit Diagnoses    Mild dehydration          Meds ordered this encounter  Medications  . DISCONTD: ondansetron (ZOFRAN ODT) 4 MG disintegrating tablet    Sig: Take 1 tablet (4 mg total) by mouth every 8 (eight) hours as needed for nausea or vomiting.    Dispense:  30 tablet    Refill:  0      Follow up plan: Return in about  1 week (around 11/07/2016), or if symptoms worsen or fail to improve, for viral gastroenteritis.  [name], DO [facility] 10/31/2016, 9:59 PM

## 2016-10-31 NOTE — ED Notes (Signed)
Pt unable to give stool/urine sample at this time

## 2016-10-31 NOTE — ED Triage Notes (Signed)
Pt was seen by PMD and sent to ER for eval for n/v/d.  Pt was seen in ER 2 days ago with same sx.  Pt states she isn't any better.  Pt also reports low abd pain.  No vag bleeding no urinary sx.

## 2016-10-31 NOTE — Patient Instructions (Signed)
Thank you for coming in to clinic today.  1. You most likely have a Viral Gastroenteritis as discussed, there is no definitive treatment for this it will need to run it's course, sometimes it can take over a week - Take Zofran ODT (oral dissolving tablets) up to 2 per dose as needed for nausea and vomiting, these you do not have to worry about vomiting with them - Try to stay well hydrated with gatorade, water, and can gradually introduce small amount soft bland foods - You are dehydrated on exam today and I am concerned you are not improving, you would benefit from re-evaluation at ED hospital if still worsening, and may need IV fluid rehydration as well  It is not advised to take diarrhea medication with viral syndrome  Please schedule a follow-up appointment with Dr. Parks Ranger as needed within 1 week for nausea, vomiting, diarrhea if not improving  If you have any other questions or concerns, please feel free to call the clinic or send a message through [place]. You may also schedule an earlier appointment if necessary.  [name], DO [place]

## 2016-10-31 NOTE — ED Notes (Signed)
Pt sts she is unable to given stool/urine sample at this time

## 2016-10-31 NOTE — Discharge Instructions (Signed)
Please take and nausea and diarrhea medications as needed, as prescribed. Please follow-up your primary care doctor in the next 2-3 days for recheck/reevaluation. Drink plenty of fluids, and obtain plenty of rest. Return to the emergency department for any significant abdominal pain, fever, or any other symptom personally concerning to yourself.
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
## 2016-10-31 NOTE — Assessment & Plan Note (Addendum)
Consistent with viral gastroenteritis, >5 days now worsening diarrhea, nausea, limited PO, tolerating liquids. Known sick contacts. Clinically mild dehydration, tachycardic, benign abdomen on exam - Concern given lack of improvement s/p recent ED visit, symptoms worsening with some dehydration  Plan: 1. Given rx Zofran ODT as trial instead of Zofran pills to avoid pill coming up if still vomiting 2. Counseled on oral rehydration 3. May try OTC Loperamide as needed for diarrhea limited to only few days if diarrhea worsens or excessive, otherwise should not continue regularly in setting of viral gastro, as it is likely to be self limited 4. Tylenol / Ibuprofen PRN fever 5. Recommend go directly to ED for further evaluation if continues to worsen, suspect would be candidate for IVF rehydration and re-eval

## 2016-11-01 ENCOUNTER — Encounter: Payer: Self-pay | Admitting: Family Medicine

## 2016-11-07 ENCOUNTER — Encounter: Payer: Self-pay | Admitting: Obstetrics and Gynecology

## 2016-11-07 ENCOUNTER — Encounter: Payer: Self-pay | Admitting: Family Medicine

## 2016-11-07 DIAGNOSIS — I10 Essential (primary) hypertension: Secondary | ICD-10-CM

## 2016-11-15 ENCOUNTER — Ambulatory Visit (INDEPENDENT_AMBULATORY_CARE_PROVIDER_SITE_OTHER): Payer: Medicare Other | Admitting: Obstetrics and Gynecology

## 2016-11-15 ENCOUNTER — Other Ambulatory Visit (INDEPENDENT_AMBULATORY_CARE_PROVIDER_SITE_OTHER): Payer: Medicare Other

## 2016-11-15 VITALS — BP 128/78 | HR 70 | Ht 66.0 in | Wt 256.0 lb

## 2016-11-15 DIAGNOSIS — N939 Abnormal uterine and vaginal bleeding, unspecified: Secondary | ICD-10-CM | POA: Diagnosis not present

## 2016-11-15 DIAGNOSIS — Z8041 Family history of malignant neoplasm of ovary: Secondary | ICD-10-CM | POA: Diagnosis not present

## 2016-11-15 DIAGNOSIS — R232 Flushing: Secondary | ICD-10-CM | POA: Diagnosis not present

## 2016-11-15 NOTE — Progress Notes (Signed)
Chief complaint: 1. Follow-up on genetic testing 2. Family history of ovarian cancer 3. Abnormal uterine bleeding  Patient presents for follow-up. Genetic testing results from [place] are reviewed. 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 This variant may have an association with Lynch syndrome. Patient is encouraged to follow-up with the genetic counselor for explanation and further recommendations regarding follow-up ([id number] [phone])  Since stopping Depo-Provera patient has had episodes of light spotting. She is to continue without Depo-Provera injections and monitor for any heavy bleeding. Should she develop heavy bleeding, we will reinstitute Depo-Provera therapy. If there is no new onset bleeding of a cyclic manner, then FSH testing will be obtained in 6 months, at the time of her annual exam.  Patient has not obtained ultrasound as previously recommended due to our mistake in not ordering the test. This will be obtained within the next week.  ASSESSMENT: 1. Family history of ovarian cancer and breast cancer in maternal grandmother 2. Genetic testing notable for one variant of uncertain clinical significance 3. Abnormal uterine bleeding in the form of spotting, off Depo-Provera at this time  PLAN: 1. Patient is to contact the genetic counselor for further explanation and recommendations 2. Pelvic ultrasound is ordered to assess adnexa 3. Return in 6 months for annual exam and follow-up on abnormal uterine bleeding.  A total of 25 minutes were spent face-to-face with the patient during this encounter and over half of that time involved counseling and coordination of care.  Martin A Defrancesco, MD  Note: This dictation was prepared with Dragon dictation along with smaller phrase technology. Any transcriptional errors that result from this process are unintentional.  

## 2016-11-15 NOTE — Patient Instructions (Signed)
PLAN: 1. Patient is to contact the genetic counselor for further explanation and recommendations 2. Pelvic ultrasound is ordered to assess adnexa 3. Return in 6 months for annual exam and follow-up on abnormal uterine bleeding.

## 2016-11-19 MED ORDER — ATENOLOL 50 MG PO TABS: 50.0000 mg | ORAL_TABLET | Freq: Every day | ORAL | 11 refills | Status: DC

## 2016-11-19 NOTE — Addendum Note (Signed)
Addended by: Olin Hauser on: 11/19/2016 03:05 PM   Modules accepted: Orders

## 2016-11-25 ENCOUNTER — Emergency Department
Admission: EM | Admit: 2016-11-25 | Discharge: 2016-11-25 | Disposition: A | Discharge status: Home / Self Care | Payer: Medicare Other | Attending: Emergency Medicine | Admitting: Emergency Medicine

## 2016-11-25 ENCOUNTER — Emergency Department: Payer: Medicare Other

## 2016-11-25 DIAGNOSIS — I10 Essential (primary) hypertension: Secondary | ICD-10-CM | POA: Diagnosis not present

## 2016-11-25 DIAGNOSIS — F17210 Nicotine dependence, cigarettes, uncomplicated: Secondary | ICD-10-CM | POA: Insufficient documentation

## 2016-11-25 DIAGNOSIS — E039 Hypothyroidism, unspecified: Secondary | ICD-10-CM | POA: Insufficient documentation

## 2016-11-25 DIAGNOSIS — Z79899 Other long term (current) drug therapy: Secondary | ICD-10-CM | POA: Insufficient documentation

## 2016-11-25 DIAGNOSIS — J209 Acute bronchitis, unspecified: Secondary | ICD-10-CM

## 2016-11-25 DIAGNOSIS — R0600 Dyspnea, unspecified: Secondary | ICD-10-CM | POA: Diagnosis not present

## 2016-11-25 DIAGNOSIS — J40 Bronchitis, not specified as acute or chronic: Secondary | ICD-10-CM

## 2016-11-25 DIAGNOSIS — R0602 Shortness of breath: Secondary | ICD-10-CM | POA: Diagnosis present

## 2016-11-25 LAB — CBC WITH DIFFERENTIAL/PLATELET
Basophils Absolute: 0 10*3/uL (ref 0–0.1)
Basophils Relative: 0 %
Eosinophils Absolute: 0.2 10*3/uL (ref 0–0.7)
Eosinophils Relative: 2 %
HCT: 36.3 % (ref 35.0–47.0)
Hemoglobin: 12.2 g/dL (ref 12.0–16.0)
Lymphocytes Relative: 14 %
Lymphs Abs: 1.6 10*3/uL (ref 1.0–3.6)
MCH: 29.1 pg (ref 26.0–34.0)
MCHC: 33.6 g/dL (ref 32.0–36.0)
MCV: 86.6 fL (ref 80.0–100.0)
Monocytes Absolute: 0.8 10*3/uL (ref 0.2–0.9)
Monocytes Relative: 7 %
Neutro Abs: 9.3 10*3/uL — ABNORMAL HIGH (ref 1.4–6.5)
Neutrophils Relative %: 77 %
Platelets: 273 10*3/uL (ref 150–440)
RBC: 4.2 MIL/uL (ref 3.80–5.20)
RDW: 14.8 % — ABNORMAL HIGH (ref 11.5–14.5)
WBC: 12 10*3/uL — ABNORMAL HIGH (ref 3.6–11.0)

## 2016-11-25 LAB — INFLUENZA PANEL BY PCR (TYPE A & B)
Influenza A By PCR: NEGATIVE
Influenza B By PCR: NEGATIVE

## 2016-11-25 LAB — COMPREHENSIVE METABOLIC PANEL
ALT: 16 U/L (ref 14–54)
AST: 19 U/L (ref 15–41)
Albumin: 4.3 g/dL (ref 3.5–5.0)
Alkaline Phosphatase: 77 U/L (ref 38–126)
Anion gap: 8 (ref 5–15)
BUN: 5 mg/dL — ABNORMAL LOW (ref 6–20)
CO2: 25 mmol/L (ref 22–32)
Calcium: 8.9 mg/dL (ref 8.9–10.3)
Chloride: 107 mmol/L (ref 101–111)
Creatinine, Ser: 0.71 mg/dL (ref 0.44–1.00)
GFR calc Af Amer: >60 mL/min (ref >60)
GFR calc non Af Amer: >60 mL/min (ref >60)
Glucose, Bld: 112 mg/dL — ABNORMAL HIGH (ref 65–99)
Potassium: 3.6 mmol/L (ref 3.5–5.1)
Sodium: 140 mmol/L (ref 135–145)
Total Bilirubin: 0.6 mg/dL (ref 0.3–1.2)
Total Protein: 8 g/dL (ref 6.5–8.1)

## 2016-11-25 LAB — EXPECTORATED SPUTUM ASSESSMENT W GRAM STAIN, RFLX TO RESP C

## 2016-11-25 LAB — TROPONIN I: Troponin I: <0.03 ng/mL (ref <0.03)

## 2016-11-25 MED ORDER — AZITHROMYCIN 250 MG PO TABS: ORAL_TABLET | ORAL | 0 refills | Status: DC

## 2016-11-25 MED ORDER — IBUPROFEN 800 MG PO TABS
800.0000 mg | ORAL_TABLET | Freq: Once | ORAL | Status: AC
  Administered 2016-11-25: 800 mg via ORAL
  Filled 2016-11-25: qty 1

## 2016-11-25 MED ORDER — IPRATROPIUM-ALBUTEROL 0.5-2.5 (3) MG/3ML IN SOLN
3.0000 mL | Freq: Once | RESPIRATORY_TRACT | Status: AC
  Administered 2016-11-25: 3 mL via RESPIRATORY_TRACT
  Filled 2016-11-25: qty 3

## 2016-11-25 MED ORDER — PREDNISONE 20 MG PO TABS
40.0000 mg | ORAL_TABLET | Freq: Once | ORAL | Status: AC
  Administered 2016-11-25: 40 mg via ORAL
  Filled 2016-11-25: qty 2

## 2016-11-25 MED ORDER — PREDNISONE 10 MG PO TABS: ORAL_TABLET | ORAL | 0 refills | Status: DC

## 2016-11-25 MED ORDER — ALBUTEROL SULFATE HFA 108 (90 BASE) MCG/ACT IN AERS: 2.0000 | INHALATION_SPRAY | Freq: Four times a day (QID) | RESPIRATORY_TRACT | 0 refills | Status: DC | PRN

## 2016-11-25 MED ORDER — AZITHROMYCIN 500 MG PO TABS
500.0000 mg | ORAL_TABLET | Freq: Once | ORAL | Status: AC
  Administered 2016-11-25: 500 mg via ORAL
  Filled 2016-11-25: qty 1

## 2016-11-25 NOTE — ED Notes (Signed)
Pt reports that she feels like she has pneumonia - she reports productive cough (yellow mustard color with foul smell) - hurts to breath and take a deep breath - pt reports she has been sick since November 11th - fever for the past week with the high of 101
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
## 2016-11-25 NOTE — ED Triage Notes (Signed)
Pt c/o cough with congestion, brown/yellow sputum for the past month.. Pt is in NAD on arrival. Respirations WNL.Marland Kitchen

## 2016-11-25 NOTE — Discharge Instructions (Signed)
You were evaluated for trouble breathing and found to have wheezing also called bronco spasm for which your being started on prednisone and albuterol inhaler. The cause of your cough and fever, you are being started on antibiotic azithromycin also called Z-Pak for possible bacterial pneumonia although your x-ray was clear today.  Return to the emergency department for any worsening symptoms including trouble breathing, chest pain, weakness, numbness, confusion or altered mental status, dizziness or passing out, or any other symptoms concerning to you.

## 2016-11-27 LAB — CULTURE, RESPIRATORY W GRAM STAIN

## 2016-11-28 ENCOUNTER — Encounter: Payer: Self-pay | Admitting: Family Medicine

## 2016-11-28 ENCOUNTER — Telehealth: Payer: Self-pay | Admitting: Family Medicine

## 2016-11-28 DIAGNOSIS — F4310 Post-traumatic stress disorder, unspecified: Secondary | ICD-10-CM | POA: Diagnosis not present

## 2016-11-28 NOTE — Telephone Encounter (Signed)
Pt went to ER over the weekend and is very concerned about the stuff she has been coughing up.  She asked for a call back [phone]

## 2016-11-28 NOTE — Telephone Encounter (Signed)
MyChart message was responded to by nursing staff and patients questions were answered. I agree she is to continue current treatment for bronchitis.

## 2016-11-28 NOTE — Progress Notes (Signed)
ED Antimicrobial Stewardship Positive Culture Follow Up   [name] is an 45 y.o. female who presented to [facility] on 11/25/2016 with a chief complaint of essential hypertension.   Chief Complaint  Patient presents with  . Shortness of Breath  . Cough    Recent Results (from the past 720 hour(s))  Culture, blood (routine x 2)     Status: None (Preliminary result)   Collection Time: 11/25/16  1:30 PM  Result Value Ref Range Status   Specimen Description BLOOD RIGHT ASSIST CONTROL  Final   Special Requests   Final    BOTTLES DRAWN AEROBIC AND ANAEROBIC  AEROBIC 13CC, ANAEROBIC 10CC   Culture NO GROWTH 3 DAYS  Final   Report Status PENDING  Incomplete  Culture, blood (routine x 2)     Status: None (Preliminary result)   Collection Time: 11/25/16  1:30 PM  Result Value Ref Range Status   Specimen Description BLOOD LEFT HAND  Final   Special Requests   Final    BOTTLES DRAWN AEROBIC AND ANAEROBIC  AEROBIC 11CC, ANAEROBIC 12CC   Culture NO GROWTH 3 DAYS  Final   Report Status PENDING  Incomplete  Culture, expectorated sputum-assessment     Status: None   Collection Time: 11/25/16  1:30 PM  Result Value Ref Range Status   Specimen Description SPUTUM  Final   Special Requests NONE  Final   Sputum evaluation THIS SPECIMEN IS ACCEPTABLE FOR SPUTUM CULTURE  Final   Report Status 11/25/2016 FINAL  Final  Culture, respiratory (NON-Expectorated)     Status: None   Collection Time: 11/25/16  1:30 PM  Result Value Ref Range Status   Specimen Description SPUTUM  Final   Special Requests NONE Reflexed from [id number]  Final   Gram Stain   Final    ABUNDANT WBC PRESENT, PREDOMINANTLY PMN RARE SQUAMOUS EPITHELIAL CELLS PRESENT MODERATE GRAM POSITIVE COCCI IN PAIRS AND CHAINS FEW GRAM NEGATIVE COCCOBACILLI    Culture   Final    MODERATE HAEMOPHILUS INFLUENZAE BETA LACTAMASE NEGATIVE Performed at [facility]    Report Status 11/27/2016 FINAL  Final    Patient was  treated with Z-Pack. Respiratory culture positive for moderate Haemophilus influenzae. No further adjustments warranted.     Alister Staver L 11/28/2016, 10:55 AM [phone]

## 2016-11-28 NOTE — Telephone Encounter (Signed)
Returned call to patient. She was reading my chart results from [facility] and concerned. Patient made aware she being treated for bronchitis. Lab results bacteria in sputum patient being treated with zpac. No further questions.[place]

## 2016-11-29 ENCOUNTER — Emergency Department: Payer: Medicare Other

## 2016-11-29 ENCOUNTER — Encounter: Payer: Self-pay | Admitting: Emergency Medicine

## 2016-11-29 ENCOUNTER — Emergency Department
Admission: EM | Admit: 2016-11-29 | Discharge: 2016-11-29 | Disposition: A | Discharge status: Home / Self Care | Payer: Medicare Other | Attending: Emergency Medicine | Admitting: Emergency Medicine

## 2016-11-29 DIAGNOSIS — F17210 Nicotine dependence, cigarettes, uncomplicated: Secondary | ICD-10-CM | POA: Insufficient documentation

## 2016-11-29 DIAGNOSIS — J9801 Acute bronchospasm: Secondary | ICD-10-CM | POA: Diagnosis not present

## 2016-11-29 DIAGNOSIS — E039 Hypothyroidism, unspecified: Secondary | ICD-10-CM | POA: Diagnosis not present

## 2016-11-29 DIAGNOSIS — I10 Essential (primary) hypertension: Secondary | ICD-10-CM | POA: Diagnosis not present

## 2016-11-29 DIAGNOSIS — R05 Cough: Secondary | ICD-10-CM

## 2016-11-29 DIAGNOSIS — Z79899 Other long term (current) drug therapy: Secondary | ICD-10-CM | POA: Insufficient documentation

## 2016-11-29 DIAGNOSIS — R059 Cough, unspecified: Secondary | ICD-10-CM

## 2016-11-29 MED ORDER — HYDROCOD POLST-CPM POLST ER 10-8 MG/5ML PO SUER: 5.0000 mL | Freq: Two times a day (BID) | ORAL | 0 refills | Status: DC

## 2016-11-29 MED ORDER — IPRATROPIUM-ALBUTEROL 0.5-2.5 (3) MG/3ML IN SOLN
3.0000 mL | Freq: Once | RESPIRATORY_TRACT | Status: AC
  Administered 2016-11-29: 3 mL via RESPIRATORY_TRACT
  Filled 2016-11-29: qty 3

## 2016-11-29 MED ORDER — BENZONATATE 100 MG PO CAPS
200.0000 mg | ORAL_CAPSULE | Freq: Once | ORAL | Status: AC
  Administered 2016-11-29: 200 mg via ORAL
  Filled 2016-11-29: qty 2

## 2016-11-29 NOTE — ED Triage Notes (Signed)
Pt presents to ED with c/o cough and fever states was given Z-pack and prednisone without relief. Pt states continued productive cough at this time that is dark yellow in nature. Pt states received results of sputum culture unable to state what was found, upon checking the chart blood cultures were negative and some bacteria found in sputum culture. Pt is alert and oriented, NAD noted at this time.
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
## 2016-11-30 ENCOUNTER — Ambulatory Visit: Payer: Medicare Other | Admitting: Family Medicine

## 2016-11-30 LAB — CULTURE, BLOOD (ROUTINE X 2)
Culture: NO GROWTH
Culture: NO GROWTH

## 2016-12-05 DIAGNOSIS — F4310 Post-traumatic stress disorder, unspecified: Secondary | ICD-10-CM | POA: Diagnosis not present

## 2016-12-05 DIAGNOSIS — Z79899 Other long term (current) drug therapy: Secondary | ICD-10-CM | POA: Diagnosis not present

## 2016-12-12 ENCOUNTER — Other Ambulatory Visit: Payer: Medicare Other

## 2016-12-12 ENCOUNTER — Other Ambulatory Visit: Payer: Self-pay | Admitting: *Deleted

## 2016-12-12 DIAGNOSIS — R7303 Prediabetes: Secondary | ICD-10-CM | POA: Diagnosis not present

## 2016-12-12 DIAGNOSIS — M19049 Primary osteoarthritis, unspecified hand: Secondary | ICD-10-CM

## 2016-12-12 DIAGNOSIS — M2550 Pain in unspecified joint: Secondary | ICD-10-CM

## 2016-12-12 DIAGNOSIS — Z6841 Body Mass Index (BMI) 40.0 and over, adult: Secondary | ICD-10-CM

## 2016-12-12 DIAGNOSIS — E039 Hypothyroidism, unspecified: Secondary | ICD-10-CM | POA: Diagnosis not present

## 2016-12-12 DIAGNOSIS — E782 Mixed hyperlipidemia: Secondary | ICD-10-CM | POA: Diagnosis not present

## 2016-12-12 DIAGNOSIS — G8929 Other chronic pain: Secondary | ICD-10-CM | POA: Diagnosis not present

## 2016-12-12 DIAGNOSIS — M129 Arthropathy, unspecified: Secondary | ICD-10-CM | POA: Diagnosis not present

## 2016-12-12 LAB — LIPID PANEL
Cholesterol: 190 mg/dL (ref <200)
HDL: 44 mg/dL — ABNORMAL LOW (ref >50)
LDL Cholesterol: 116 mg/dL — ABNORMAL HIGH (ref <100)
Total CHOL/HDL Ratio: 4.3 Ratio (ref <5.0)
Triglycerides: 151 mg/dL — ABNORMAL HIGH (ref <150)
VLDL: 30 mg/dL (ref <30)

## 2016-12-12 LAB — TSH: TSH: 1.32 mIU/L

## 2016-12-13 ENCOUNTER — Ambulatory Visit (INDEPENDENT_AMBULATORY_CARE_PROVIDER_SITE_OTHER): Payer: Medicare Other | Admitting: Family Medicine

## 2016-12-13 ENCOUNTER — Encounter: Payer: Self-pay | Admitting: Family Medicine

## 2016-12-13 VITALS — BP 117/67 | HR 86 | Temp 99.7°F | Resp 16 | Ht 66.0 in | Wt 247.0 lb

## 2016-12-13 DIAGNOSIS — J320 Chronic maxillary sinusitis: Secondary | ICD-10-CM | POA: Diagnosis not present

## 2016-12-13 DIAGNOSIS — T300 Burn of unspecified body region, unspecified degree: Secondary | ICD-10-CM | POA: Diagnosis not present

## 2016-12-13 DIAGNOSIS — J410 Simple chronic bronchitis: Secondary | ICD-10-CM | POA: Insufficient documentation

## 2016-12-13 LAB — SEDIMENTATION RATE: Sed Rate: 14 mm/hr (ref 0–20)

## 2016-12-13 LAB — C-REACTIVE PROTEIN: CRP: 3.8 mg/L (ref <8.0)

## 2016-12-13 LAB — RHEUMATOID FACTOR: Rhuematoid fact SerPl-aCnc: <14 IU/mL (ref <14)

## 2016-12-13 LAB — HEMOGLOBIN A1C
Hgb A1c MFr Bld: 5.7 % — ABNORMAL HIGH (ref <5.7)
Mean Plasma Glucose: 117 mg/dL

## 2016-12-13 LAB — CYCLIC CITRUL PEPTIDE ANTIBODY, IGG: Cyclic Citrullin Peptide Ab: <16 Units

## 2016-12-13 MED ORDER — BENZONATATE 100 MG PO CAPS: 100.0000 mg | ORAL_CAPSULE | Freq: Three times a day (TID) | ORAL | 0 refills | Status: DC | PRN

## 2016-12-13 MED ORDER — LEVOFLOXACIN 500 MG PO TABS: 500.0000 mg | ORAL_TABLET | Freq: Every day | ORAL | 0 refills | Status: DC

## 2016-12-13 MED ORDER — PREDNISONE 50 MG PO TABS: 50.0000 mg | ORAL_TABLET | Freq: Every day | ORAL | 0 refills | Status: DC
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
## 2016-12-13 NOTE — Assessment & Plan Note (Signed)
Consistent with worsening bronchitis in setting of likely viral URI with sinusitis now >1 month (+sick contacts), also likely allergic rhinitis component. - Some mixed wheezing with bronchospasm, also upper airway sounds, seems to trigger cough, otherwise lungs mostly clear but some reduced air movement, she is chronic smoker >30 years, no prior formal dx COPD  Plan: 1. Empiric antibiotic coverage for sinus/bronchitis with Levaquin 500mg  daily for 7 days 2. Start Prednisone 50mg  daily x 5 days burst treatment given bronchospasm 3. May continue use Albuterol inhaler 2 puffs q 4-6 hour x 3-5 days regularly 4. Continue Loratadine (Claritin) 10mg  daily and Flonase 2 sprays in each nostril daily for next 4-6 weeks, then may stop and use seasonally or as needed 5. Rx Tessalon Perls for cough PRN 6. Recommend trial OTC - Mucinex, Tylenol/Ibuprofen PRN, Nasal saline, lozenges, tea with honey/lemon 7. Return criteria reviewed, follow-up within 1 week if not improved
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
## 2016-12-13 NOTE — Assessment & Plan Note (Signed)
Consistent with subacute on chronic  maxillary sinusitis, likely initially viral URI vs allergic rhinitis component with worsening and persistence over past 1 month, some subjective symptoms of fever, sick contacts, maybe second sickening. No resolution on Z-pak.  Plan: 1. Trial on 2nd course of antibiotics - now try Levaquin 500mg  daily for 7 days, also coverage for possible COPD exac / bronchitis 2. Continue Resume Loratadine (Claritin) 10mg  daily and Flonase 2 sprays in each nostril daily for next 4-6 weeks, then may stop and use seasonally or as needed 3. Supportive care with nasal saline OTC, hydration 4. Return criteria reviewed

## 2016-12-28 ENCOUNTER — Encounter: Payer: Self-pay | Admitting: Family Medicine

## 2016-12-28 ENCOUNTER — Ambulatory Visit (INDEPENDENT_AMBULATORY_CARE_PROVIDER_SITE_OTHER): Payer: Medicare Other | Admitting: Family Medicine

## 2016-12-28 VITALS — BP 109/79 | HR 84 | Temp 100.2°F | Resp 16 | Ht 67.0 in | Wt 250.6 lb

## 2016-12-28 DIAGNOSIS — M129 Arthropathy, unspecified: Secondary | ICD-10-CM

## 2016-12-28 DIAGNOSIS — E039 Hypothyroidism, unspecified: Secondary | ICD-10-CM

## 2016-12-28 DIAGNOSIS — Z6841 Body Mass Index (BMI) 40.0 and over, adult: Secondary | ICD-10-CM

## 2016-12-28 DIAGNOSIS — J302 Other seasonal allergic rhinitis: Secondary | ICD-10-CM | POA: Diagnosis not present

## 2016-12-28 DIAGNOSIS — E042 Nontoxic multinodular goiter: Secondary | ICD-10-CM

## 2016-12-28 DIAGNOSIS — R6889 Other general symptoms and signs: Secondary | ICD-10-CM

## 2016-12-28 DIAGNOSIS — R7303 Prediabetes: Secondary | ICD-10-CM

## 2016-12-28 DIAGNOSIS — Z0000 Encounter for general adult medical examination without abnormal findings: Secondary | ICD-10-CM | POA: Diagnosis not present

## 2016-12-28 NOTE — Patient Instructions (Signed)
Thank you for coming in to clinic today.  1. For sinuses, it sounds like it is not an infection, but you probably have deeper congestion still in place - Try Mucinex (without other additives), 2-3 times a day for 1 week, then stop - Drink more water for now, keep hydrated, to thin secretions  For possible Thyroid Nodules - we have ordered Neck US, stay tuned on appointment  2. You have Pre-Diabetes A1c 5.7 as we discussed - Important to reduce sweet tea or sugary intake - Recommend start some regular exercise  The 5 Minute Rule of Exercise - Promise yourself to at least do 5 minutes of exercise (make sure you time it), and if at the end of 5 minutes (this is the hardest part of the work-out), if you still feel like you want stop (or not motivated to continue) then allow yourself to stop. Otherwise, more often than not you will feel encouraged that you can continue for a little while longer or even more!  Diet Recommendations for Pre-Diabetes   Reduce Starchy Foods -  (carb) foods include: Bread, rice, pasta, potatoes, corn, crackers, bagels, muffins, all baked goods.   Proteins are good foods include: Meat, fish, poultry, eggs, dairy foods, and beans such as pinto and kidney beans (beans also provide carbohydrate).   1. Eat at least 3 meals and 1-2 snacks per day. Never go more than 4-5 hours while awake without eating.   2. Limit starchy foods to TWO per meal and ONE per snack. ONE portion of a starchy  food is equal to the following:   - ONE slice of bread (or its equivalent, such as half of a hamburger bun).   - 1/2 cup of a "scoopable" starchy food such as potatoes or rice.   - 1 OUNCE (28 grams) of starchy snacks (crackers or pretzels, look on label).   - 15 grams of carbohydrate as shown on food label.   3. Both lunch and dinner should include a protein food, a carb food, and vegetables.   - Obtain twice as many veg's as protein or carbohydrate foods for both lunch and dinner.    - Try to keep frozen veg's on hand for a quick vegetable serving.     - Fresh or frozen veg's are best.   4. Breakfast should always include protein.    Please schedule a follow-up appointment with Dr. Parks Ranger in 6 months for Pre-Diabetes A1c  If you have any other questions or concerns, please feel free to call the clinic or send a message through [place]. You may also schedule an earlier appointment if necessary.  Amanda Putnam, DO [place]

## 2016-12-28 NOTE — Progress Notes (Signed)
Subjective:    Patient ID: Amanda Webb, female    DOB: 07/01/1971, 46 y.o.   MRN: [id number]  Amanda Webb is a 46 y.o. female presenting on 12/28/2016 for Annual Physical  HPI   Pre-DM / Morbid Obesity BMI >39 - Recent labs with newly diagnosed elevated A1c 5.7, previously has had abnormal glucose on prior labs - Admits to eating sweets and drinking sweet tea regularly. Limited exercise. Prior history on Metformin only took for 1 day and self discontinued due to nausea/vomiting GI intolerance. Currently not on therapy. - Weight down about 5 lbs from 255 to 250, patient attributes to poor appetite during recent illness - No significant change in lifestyle exercise or diet  Arthritis Multiple Sites / DJD Lumbar Spine and Knees: - Chronic problems over past >5 years various joints, specifically low back with lumbar spine DJD had prior work-up with [facility] back in 2014, had x-rays and Lumbar MRI showed some DJD. No significant diagnosis or surgery. Had lumbar epidural injection without improvement. No further treatments. Additionally had Knee x-ray in 2015 with some mild DJD as well, no knee treatments, surgeries or injections. Also describes chronic bilateral CMC thumb pain - Recently had screening and diagnostic labs for rheumatoid arthritis that were all negative - Today reports symptoms with intermittent flare ups with aching pain and stiffness, often worse in morning multiple joints - Takes occasional minimal dose Tylenol 500-'1000mg'$  x 1 dose, and Ibuprofen '200mg'$  x 2 doses. Some improvement but she is afraid to take higher dosages - Not using heat or ice. No regular activity - Denies joint swelling, redness, injury or trauma  Follow-up BRONCHITIS / SINUSITIS, Chronic - Resolved - Reports since last visit with persistent bronchitis sinusitis intermittently over past 2 months with several visits, she is now feeling almost back to normal. She was treated last visit with  Levaquin '500mg'$  daily x 7 days, and Prednisone burst '50mg'$  daily x 5 days (steroids worked well but made her agitated and emotional) - Today has only mild Right maxillary sinus pressure and some tenderness without significant sinus purulence, congestion, cough. - Denies any further fevers/chills (however still has chronically mildly elevated basal body temperature, stable for 2 years, see below)  Hypothyroidism / History of Thyroid Nodules: - Chronic history of hypothyroidism on levothyroxine 75mg daily, last TSH normal 11/2016 - Additionally she reports prior history of having Neck/Thyroid [place] thyroid nodules, this was done through outside doctors office, we have been unable to receive records ([facility], reportedly had multiple nodules, unsure size or character, she is asking when due for next thyroid ultrasound, however no results available - Denies any palpable nodule, skin changes, neck pain or other changes  Chronic Low Grade elevated temp - Reports that she has followed up with GYN for this issue since last visit, advised it could be related to peri-menopausal symptoms, at age 46 she is on Depo-Provera regularly for past 5-6 years, last injection was October 2017, now has had amenorrhea since that time about 3+ months now - Admits intermittent temps to low 100.22F, sometimes inc at night, will get night sweats related to this, not every night - Last pelvic [place](11/15/16) per GYN showed some small echogenic calcification focus in bilateral ovaries - Also has had genetic screening testing through GYN, showed unspecified gene, unsure if increases her risk, but she has a family history of ovarian cancer grandmother   Health Maintenance: - Up to date on influenza and  TDap vaccines - Last pap smear 07/2016 - Completed routine HIV screening - Last mammogram 08/2016  Past Medical History:  Diagnosis Date  . Allergy   . Anxiety   . Arrhythmia   . Arthritis   .  Cystitis   . Depression   . GERD (gastroesophageal reflux disease)   . Gross hematuria   . Heart murmur   . HLD (hyperlipidemia)   . Hypertension   . Hypothyroid   . Tobacco abuse    Social History   Social History  . Marital status: Divorced    Spouse name: N/A  . Number of children: N/A  . Years of education: Some college   Occupational History  . Not on file.   Social History Main Topics  . Smoking status: Current Every Day Smoker    Packs/day: 0.50    Years: 30.00    Types: Cigarettes  . Smokeless tobacco: Current User  . Alcohol use No  . Drug use: No  . Sexual activity: Not Currently   Other Topics Concern  . Not on file   Social History Narrative  . No narrative on file   Family History  Problem Relation Age of Onset  . Depression Mother   . Mental illness Mother   . Alcohol abuse Mother   . Cancer Maternal Grandmother   . Diabetes Maternal Grandmother   . Kidney disease Maternal Grandfather   . Diabetes Father   . Mental illness Father   . Depression Father   . Drug abuse Father   . Depression Paternal Grandfather   . Drug abuse Paternal Grandfather   . Bladder Cancer Neg Hx    Current Outpatient Prescriptions on File Prior to Visit  Medication Sig  . albuterol (PROVENTIL HFA;VENTOLIN HFA) 108 (90 Base) MCG/ACT inhaler Inhale 2 puffs into the lungs every 6 (six) hours as needed for wheezing or shortness of breath.  Marland Kitchen atenolol (TENORMIN) 50 MG tablet Take 1 tablet (50 mg total) by mouth daily.  . Cetirizine HCl (ZYRTEC ALLERGY) 10 MG CAPS   . clonazePAM (KLONOPIN) 1 MG tablet Take 0.5 mg by mouth daily.   Marland Kitchen EPINEPHrine (EPIPEN 2-PAK) 0.3 mg/0.3 mL IJ SOAJ injection Inject 0.3 mLs (0.3 mg total) into the muscle once.  . fluticasone (FLONASE) 50 MCG/ACT nasal spray Place 2 sprays into both nostrils daily.  . fluvoxaMINE (LUVOX) 100 MG tablet Take 150 mg by mouth 2 (two) times daily.  Marland Kitchen ketoconazole (NIZORAL) 2 % cream Apply 1 application topically  daily as needed for irritation.  Marland Kitchen ketoconazole (NIZORAL) 2 % shampoo Apply 1 application topically 2 (two) times a week.  . lamoTRIgine (LAMICTAL) 200 MG tablet Take 200 mg by mouth daily.  Marland Kitchen levothyroxine (SYNTHROID, LEVOTHROID) 50 MCG tablet Take 50 mcg by mouth daily before breakfast.  . pantoprazole (PROTONIX) 20 MG tablet Take 20 mg by mouth daily.  . QUEtiapine (SEROQUEL) 50 MG tablet Take 50 mg by mouth 3 (three) times daily.   No current facility-administered medications on file prior to visit.     Review of Systems  Constitutional: Negative for activity change, appetite change, chills, diaphoresis, fatigue and fever.  HENT: Positive for congestion (deeper sinus) and sinus pain. Negative for ear pain, hearing loss, postnasal drip, rhinorrhea, sinus pressure and sore throat.   Eyes: Negative for visual disturbance.  Respiratory: Negative for cough, chest tightness, shortness of breath and wheezing.   Cardiovascular: Negative for chest pain, palpitations and leg swelling.  Gastrointestinal: Negative for abdominal pain,  constipation, diarrhea, nausea and vomiting.  Endocrine: Positive for heat intolerance. Negative for polydipsia and polyuria.  Genitourinary: Positive for menstrual problem (amenorrhea, off depo provera). Negative for dysuria, frequency and hematuria.  Musculoskeletal: Positive for arthralgias. Negative for neck pain.  Skin: Negative for rash.  Allergic/Immunologic: Negative for environmental allergies.  Neurological: Negative for dizziness, weakness, light-headedness, numbness and headaches.  Hematological: Negative for adenopathy.  Psychiatric/Behavioral: Negative for behavioral problems, dysphoric mood and sleep disturbance.   Per HPI unless specifically indicated above     Objective:    BP 109/79   Pulse 84   Temp 100.2 F (37.9 C) (Oral)   Resp 16   Ht '5\' 7"'$  (1.702 m)   Wt 250 lb 9.6 oz (113.7 kg)   SpO2 99%   BMI 39.25 kg/m   Wt Readings from Last  3 Encounters:  12/28/16 250 lb 9.6 oz (113.7 kg)  12/13/16 247 lb (112 kg)  11/29/16 255 lb (115.7 kg)    Physical Exam  Constitutional: She is oriented to person, place, and time. She appears well-developed and well-nourished. No distress.  Well-appearing, comfortable, cooperative, obese  HENT:  Head: Normocephalic and atraumatic.  Mouth/Throat: Oropharynx is clear and moist.  Mild R > L maxillary sinus tenderness to palpation. Nares patent without purulence or edema. Bilateral TMs clear without erythema, effusion or bulging. Oropharynx clear without erythema, exudates, edema or asymmetry.  Eyes: Conjunctivae and EOM are normal. Pupils are equal, round, and reactive to light.  Neck: Normal range of motion. Neck supple. No thyromegaly present.  No palpable thyroid nodules. Non tender neck.  Cardiovascular: Normal rate, regular rhythm, normal heart sounds and intact distal pulses.   No murmur heard. Pulmonary/Chest: Effort normal and breath sounds normal. No respiratory distress. She has no wheezes. She has no rales.  Good air movement.  Abdominal: Soft. Bowel sounds are normal. She exhibits no distension. There is no tenderness.  Musculoskeletal: Normal range of motion. She exhibits no edema or tenderness.  Lymphadenopathy:    She has no cervical adenopathy.  Neurological: She is alert and oriented to person, place, and time.  Skin: Skin is warm and dry. No rash noted. She is not diaphoretic.  Mild facial erythematous flushing bilateral  Psychiatric: She has a normal mood and affect. Her behavior is normal.  Well groomed, good eye contact, positive mood and congruent affect, normal speech and thoughts  Nursing note and vitals reviewed.    I have personally reviewed the following lab results from 12/12/16.  Results for orders placed or performed in visit on 12/12/16  Hemoglobin A1c  Result Value Ref Range   Hgb A1c MFr Bld 5.7 (H) <5.7 %   Mean Plasma Glucose 117 mg/dL  Lipid  panel  Result Value Ref Range   Cholesterol 190 <200 mg/dL   Triglycerides 151 (H) <150 mg/dL   HDL 44 (L) >50 mg/dL   Total CHOL/HDL Ratio 4.3 <5.0 Ratio   VLDL 30 <30 mg/dL   LDL Cholesterol 116 (H) <100 mg/dL  TSH  Result Value Ref Range   TSH 1.32 mIU/L  Rheumatoid factor  Result Value Ref Range   Rhuematoid fact SerPl-aCnc <00 <92 IU/mL  Cyclic citrul peptide antibody, IgG  Result Value Ref Range   Cyclic Citrullin Peptide Ab <16 Units  Sed Rate (ESR)  Result Value Ref Range   Sed Rate 14 0 - 20 mm/hr  C-reactive protein  Result Value Ref Range   CRP 3.8 <8.0 mg/L  Assessment & Plan:   Problem List Items Addressed This Visit    Pre-diabetes    Newly confirmed diagnosis last A1c 5.7, in setting of morbid obesity, some family history of DM. Poor diet and limited lifestyle modifications  Plan: 1. Reviewed diagnosis of Pre-DM and future risk of progression to DM 2. Encouraged improve healthy lifestyle with low sugar low carb diet, limit or stop sugary drinks like sweet tea, start regular exercise, handouts given 3. Follow-up 6 months for Pre-DM A1c re-check, in past had intolerance to Metformin with 1 day then stopped (may have been too high of dose), consider future XR dosing if needed      Multiple thyroid nodules    With hypothyroidism, and prior history of multiple thyroid nodules on last [place] 2 years ago - Ordered neck/thyroid US - to be scheduled and patient notified, based on recommendations with radiology interpretation will determine if needs future follow-up vs FNA      Relevant Orders   US THYROID   Morbid obesity with BMI of 40.0-44.9, adult ([place])    BMI >39, recent wt loss -5lbs due to illness, however still remains poor lifestyle diet / limited exercise. Now with dx Pre-DM A1c 5.7  Plan: 1. Reviewed Pre-DM lifestyle modifications, diet, exercise, handout given 2. Follow-up 6 months for Pre-DM A1c, wt, consider future referral to nutrition [place]      Hypothyroidism    Stable, last TSH around 1, at goal. Less likely to be related to chronic elevated body temperature - History of multiple thyroid nodules last [place] 2 years ago  Plan: 1. Continue levothyroxine 100mg daily, no change 2. Ordered neck/thyroid [place]- to be scheduled and patient notified, based on recommendations with radiology interpretation will determine if needs future follow-up vs FNA      Relevant Orders   [place]   Arthritis, multiple joint involvement    Most consistent with chronic stable multiple joint involvement of osteoarthritis with known DJD in bilateral hands, left knee and lumbar spine. - Recent labs screening for RA all negative ESR, CRP, RF, anti-CCP  Plan: 1. Continue conservative therapy with regular Tylenol dosing, NSAID PRN, RICE therapy, topical heat etc 2. Follow-up as needed      Alteration of body temperature    Persistent chronic low grade elevated body temp with fluctuations, with associated night sweats, still suspect likely related to hormonal depo provera and peri-menopausal - Now off Depo since 09/2016, amenorrhea over that time - Continue follow-up with GYN, no changes today      Allergic rhinitis    Prior sinusitis / bronchitis, now some lingering rhinosinusitis and deeper congestion possibly in R maxillary sinus. - No indication for repeat antibiotics today given clinically well - Recommend trial on Mucinex OTC - Continue Flonase - Follow-up as needed if worsening       Other Visit Diagnoses    Annual physical exam    -  Primary      No orders of the defined types were placed in this encounter.     Follow up plan: Return in about 6 months (around 06/27/2017) for Pre-Diabetes A1c.  ANobie Putnam [facility] 12/30/2016, 9:38 AM

## 2016-12-30 ENCOUNTER — Encounter: Payer: Self-pay | Admitting: Family Medicine

## 2016-12-30 NOTE — Assessment & Plan Note (Addendum)
BMI >39, recent wt loss -5lbs due to illness, however still remains poor lifestyle diet / limited exercise. Now with dx Pre-DM A1c 5.7  Plan: 1. Reviewed Pre-DM lifestyle modifications, diet, exercise, handout given 2. Follow-up 6 months for Pre-DM A1c, wt, consider future referral to nutrition [place]

## 2016-12-30 NOTE — Assessment & Plan Note (Signed)
With hypothyroidism, and prior history of multiple thyroid nodules on last [place] 2 years ago - Ordered neck/thyroid US - to be scheduled and patient notified, based on recommendations with radiology interpretation will determine if needs future follow-up vs FNA

## 2016-12-30 NOTE — Assessment & Plan Note (Signed)
Most consistent with chronic stable multiple joint involvement of osteoarthritis with known DJD in bilateral hands, left knee and lumbar spine. - Recent labs screening for RA all negative ESR, CRP, RF, anti-CCP  Plan: 1. Continue conservative therapy with regular Tylenol dosing, NSAID PRN, RICE therapy, topical heat etc 2. Follow-up as needed

## 2016-12-30 NOTE — Assessment & Plan Note (Signed)
Stable, last TSH around 1, at goal. Less likely to be related to chronic elevated body temperature - History of multiple thyroid nodules last [place] 2 years ago  Plan: 1. Continue levothyroxine 79mcg daily, no change 2. Ordered neck/thyroid US - to be scheduled and patient notified, based on recommendations with radiology interpretation will determine if needs future follow-up vs FNA

## 2016-12-30 NOTE — Assessment & Plan Note (Signed)
Persistent chronic low grade elevated body temp with fluctuations, with associated night sweats, still suspect likely related to hormonal depo provera and peri-menopausal - Now off Depo since 09/2016, amenorrhea over that time - Continue follow-up with GYN, no changes today

## 2016-12-30 NOTE — Assessment & Plan Note (Addendum)
Newly confirmed diagnosis last A1c 5.7, in setting of morbid obesity, some family history of DM. Poor diet and limited lifestyle modifications  Plan: 1. Reviewed diagnosis of Pre-DM and future risk of progression to DM 2. Encouraged improve healthy lifestyle with low sugar low carb diet, limit or stop sugary drinks like sweet tea, start regular exercise, handouts given 3. Follow-up 6 months for Pre-DM A1c re-check, in past had intolerance to Metformin with 1 day then stopped (may have been too high of dose), consider future XR dosing if needed

## 2016-12-30 NOTE — Assessment & Plan Note (Signed)
Prior sinusitis / bronchitis, now some lingering rhinosinusitis and deeper congestion possibly in R maxillary sinus. - No indication for repeat antibiotics today given clinically well - Recommend trial on Mucinex OTC - Continue Flonase - Follow-up as needed if worsening

## 2017-01-02 ENCOUNTER — Ambulatory Visit: Admission: RE | Admit: 2017-01-02 | Payer: Medicare Other | Source: Ambulatory Visit

## 2017-01-09 ENCOUNTER — Ambulatory Visit: Payer: Medicare Other

## 2017-01-29 DIAGNOSIS — F4310 Post-traumatic stress disorder, unspecified: Secondary | ICD-10-CM | POA: Diagnosis not present

## 2017-01-29 DIAGNOSIS — Z79899 Other long term (current) drug therapy: Secondary | ICD-10-CM | POA: Diagnosis not present

## 2017-02-20 DIAGNOSIS — L309 Dermatitis, unspecified: Secondary | ICD-10-CM | POA: Diagnosis not present

## 2017-02-20 DIAGNOSIS — L219 Seborrheic dermatitis, unspecified: Secondary | ICD-10-CM | POA: Diagnosis not present

## 2017-02-25 ENCOUNTER — Other Ambulatory Visit: Payer: Self-pay | Admitting: Family Medicine

## 2017-03-19 DIAGNOSIS — F4310 Post-traumatic stress disorder, unspecified: Secondary | ICD-10-CM | POA: Diagnosis not present

## 2017-04-02 ENCOUNTER — Ambulatory Visit (INDEPENDENT_AMBULATORY_CARE_PROVIDER_SITE_OTHER): Payer: Medicare Other | Admitting: Obstetrics and Gynecology

## 2017-04-02 ENCOUNTER — Encounter: Payer: Self-pay | Admitting: Obstetrics and Gynecology

## 2017-04-02 VITALS — BP 118/80 | HR 79 | Ht 67.0 in | Wt 256.4 lb

## 2017-04-02 DIAGNOSIS — Z8041 Family history of malignant neoplasm of ovary: Secondary | ICD-10-CM | POA: Diagnosis not present

## 2017-04-02 DIAGNOSIS — E039 Hypothyroidism, unspecified: Secondary | ICD-10-CM

## 2017-04-02 DIAGNOSIS — Z1239 Encounter for other screening for malignant neoplasm of breast: Secondary | ICD-10-CM

## 2017-04-02 DIAGNOSIS — E6609 Other obesity due to excess calories: Secondary | ICD-10-CM

## 2017-04-02 DIAGNOSIS — Z1231 Encounter for screening mammogram for malignant neoplasm of breast: Secondary | ICD-10-CM

## 2017-04-02 DIAGNOSIS — Z01419 Encounter for gynecological examination (general) (routine) without abnormal findings: Secondary | ICD-10-CM | POA: Diagnosis not present

## 2017-04-02 DIAGNOSIS — R232 Flushing: Secondary | ICD-10-CM

## 2017-04-02 DIAGNOSIS — Z6841 Body Mass Index (BMI) 40.0 and over, adult: Secondary | ICD-10-CM | POA: Diagnosis not present

## 2017-04-02 DIAGNOSIS — Z124 Encounter for screening for malignant neoplasm of cervix: Secondary | ICD-10-CM

## 2017-04-02 DIAGNOSIS — N393 Stress incontinence (female) (male): Secondary | ICD-10-CM | POA: Diagnosis not present

## 2017-04-02 DIAGNOSIS — Z720 Tobacco use: Secondary | ICD-10-CM | POA: Diagnosis not present

## 2017-04-02 DIAGNOSIS — IMO0001 Reserved for inherently not codable concepts without codable children: Secondary | ICD-10-CM

## 2017-04-02 NOTE — Progress Notes (Signed)
/ ANNUAL PREVENTATIVE CARE GYN  ENCOUNTER NOTE  Subjective:       Amanda Webb is a 46 y.o. [id number] female here for a routine annual gynecologic exam.  Current complaints: 1.  Review u/s from 10/2016  mild dysplasia- cryotherapy- 1996 normal pap since  Ultrasound in November 2017 showed ovaries with bilateral calcifications, punctate, unclear etiology Maternal grandmother had ovarian cancer; mom and maternal aunt had hysterectomies with the oophorectomies Patient is experiencing vasomotor symptoms on a daily basis. Last Depo-Provera injection was August 2017   Gynecologic History No LMP recorded (lmp unknown). Patient has had an injection. Contraception: abstinence Last Pap: 2017 wnl. Results were: normal Last mammogram: 2017  wnl. Results were: normal Patient has significant psychosocial stressors; she does see a psychiatrist; I have encouraged her to also see a counselor  Obstetric History OB History  Gravida Para Term Preterm AB Living  5 4 4   1 4   SAB TAB Ectopic Multiple Live Births    1     4    # Outcome Date GA Lbr Len/2nd Weight Sex Delivery Anes PTL Lv  5 Term 1996   9 lb 9.6 oz (4.355 kg) F Vag-Spont   LIV  4 TAB 1996          3 Term 1994   7 lb 8 oz (3.402 kg) M Vag-Spont   LIV  2 Term 1992   7 lb 8 oz (3.402 kg) F Vag-Spont   LIV  1 Term 1990   7 lb 8 oz (3.402 kg) M Vag-Spont   LIV    Obstetric Comments  a    Past Medical History:  Diagnosis Date  . Allergy   . Anxiety   . Arrhythmia   . Arthritis   . Cystitis   . Depression   . GERD (gastroesophageal reflux disease)   . Gross hematuria   . Heart murmur   . HLD (hyperlipidemia)   . Hypertension   . Hypothyroid   . Tobacco abuse     Past Surgical History:  Procedure Laterality Date  . CARPAL TUNNEL RELEASE Bilateral 2011  . FOOT SURGERY Right 2013   Plantar fascia  . TONSILLECTOMY  1979    Current Outpatient Prescriptions on File Prior to Visit  Medication Sig Dispense Refill  .  albuterol (PROVENTIL HFA;VENTOLIN HFA) 108 (90 Base) MCG/ACT inhaler Inhale 2 puffs into the lungs every 6 (six) hours as needed for wheezing or shortness of breath. 1 Inhaler 0  . atenolol (TENORMIN) 50 MG tablet Take 1 tablet (50 mg total) by mouth daily. 30 tablet 11  . Cetirizine HCl (ZYRTEC ALLERGY) 10 MG CAPS     . EPINEPHrine (EPIPEN 2-PAK) 0.3 mg/0.3 mL IJ SOAJ injection Inject 0.3 mLs (0.3 mg total) into the muscle once. 1 Device 0  . fluticasone (FLONASE) 50 MCG/ACT nasal spray Place 2 sprays into both nostrils daily.    . fluvoxaMINE (LUVOX) 100 MG tablet Take 150 mg by mouth 2 (two) times daily.    Marland Kitchen ketoconazole (NIZORAL) 2 % cream Apply 1 application topically daily as needed for irritation.    Marland Kitchen ketoconazole (NIZORAL) 2 % shampoo Apply 1 application topically 2 (two) times a week.    . lamoTRIgine (LAMICTAL) 200 MG tablet Take 200 mg by mouth daily.    Marland Kitchen levothyroxine (SYNTHROID, LEVOTHROID) 50 MCG tablet take 1 tablet by mouth every morning 30 tablet 5  . pantoprazole (PROTONIX) 20 MG tablet Take 20 mg by mouth daily.    Marland Kitchen  QUEtiapine (SEROQUEL) 50 MG tablet Take 50 mg by mouth 3 (three) times daily.     No current facility-administered medications on file prior to visit.     Allergies  Allergen Reactions  . Bee Venom Anaphylaxis, Hives and Swelling    Carries Epi pen.  . Cat Hair Extract Itching, Other (See Comments) and Swelling    Allergic to trees, nuts, wheat, grass, cats & dogs - itchy watery eyes, swelling. Uses Zyrtec & Flonase & Benadryl if really bad. Used to get allergy shots.  . Tetracyclines & Related Nausea And Vomiting  . Lac Bovis Diarrhea and Nausea And Vomiting  . Lactose Diarrhea and Nausea And Vomiting  . Tape Other (See Comments) and Rash    Needs to use paper tape. Breaks out with severe rash, pulls skin off when using adhesive.    Social History   Social History  . Marital status: Divorced    Spouse name: N/A  . Number of children: N/A  . Years  of education: Some college   Occupational History  . Not on file.   Social History Main Topics  . Smoking status: Current Every Day Smoker    Packs/day: 0.50    Years: 30.00    Types: Cigarettes  . Smokeless tobacco: Current User  . Alcohol use No  . Drug use: No  . Sexual activity: Not Currently   Other Topics Concern  . Not on file   Social History Narrative  . No narrative on file    Family History  Problem Relation Age of Onset  . Depression Mother   . Mental illness Mother   . Alcohol abuse Mother   . Cancer Maternal Grandmother   . Diabetes Maternal Grandmother   . Kidney disease Maternal Grandfather   . Diabetes Father   . Mental illness Father   . Depression Father   . Drug abuse Father   . Depression Paternal Grandfather   . Drug abuse Paternal Grandfather   . Bladder Cancer Neg Hx     The following portions of the patient's history were reviewed and updated as appropriate: allergies, current medications, past family history, past medical history, past social history, past surgical history and problem list.  Review of Systems ROS Review of Systems - General ROS: negative for - chills, fatigue, fever, hot flashes, night sweats, weight gain or weight loss Psychological ROS: negative for - decreased libido, depression, mood swings, physical abuse or sexual abuse. POSITIVE-anxiety Ophthalmic ROS: negative for - blurry vision, eye pain or loss of vision ENT ROS: negative for - headaches, hearing change, visual changes or vocal changes Allergy and Immunology ROS: negative for - hives, itchy/watery eyes or seasonal allergies Hematological and Lymphatic ROS: negative for - bleeding problems, bruising, swollen lymph nodes or weight loss Endocrine ROS: negative for - galactorrhea, hair pattern changes, malaise/lethargy, mood swings, palpitations, polydipsia/polyuria, skin changes, temperature intolerance or unexpected weight changes. POSITIVE-hot flashes Breast ROS:  negative for - new or changing breast lumps or nipple discharge Respiratory ROS: negative for - cough or shortness of breath Cardiovascular ROS: negative for - chest pain, irregular heartbeat, palpitations or shortness of breath Gastrointestinal ROS: no abdominal pain, change in bowel habits, or black or bloody stools Genito-Urinary ROS: no dysuria, trouble voiding, or hematuria Musculoskeletal ROS: negative for - joint pain or joint stiffness Neurological ROS: negative for - bowel and bladder control changes Dermatological ROS: negative for rash and skin lesion changes   Objective:   BP 118/80  Pulse 79   Ht 5\' 7"  (1.702 m)   Wt 256 lb 6.4 oz (116.3 kg)   LMP  (LMP Unknown)   BMI 40.16 kg/m  CONSTITUTIONAL: Well-developed, well-nourished female in no acute distress.  PSYCHIATRIC: Normal mood and affect. Normal behavior. Normal judgment and thought content. [place]: Alert and oriented to person, place, and time. Normal muscle tone coordination. No cranial nerve deficit noted. HENT:  Normocephalic, atraumatic, External right and left ear normal. Oropharynx is clear and moist EYES: Conjunctivae and EOM are normal.  No scleral icterus.  NECK: Normal range of motion, supple, no masses.  Normal thyroid.  SKIN: Skin is warm and dry. No rash noted. Not diaphoretic. No erythema. No pallor. CARDIOVASCULAR: Normal heart rate noted, regular rhythm, no murmur. RESPIRATORY: Clear to auscultation bilaterally. Effort and breath sounds normal, no problems with respiration noted. BREASTS: Symmetric in size. No masses, skin changes, nipple drainage, or lymphadenopathy. ABDOMEN: Soft, normal bowel sounds, no distention noted.  No tenderness, rebound or guarding.  BLADDER: Normal PELVIC:  External Genitalia: Normal  BUS: Normal  Vagina: Normal  Cervix: Normal; no lesions; no cervical motion tenderness  Uterus: Normal; midplane, normal size and shape, mobile, nontender  Adnexa: Normal; nonpalpable  and nontender  RV: External Exam NormaI, No Rectal Masses and Normal Sphincter tone  MUSCULOSKELETAL: Normal range of motion. No tenderness.  No cyanosis, clubbing, or edema.  2+ distal pulses. LYMPHATIC: No Axillary, Supraclavicular, or Inguinal Adenopathy.    Assessment:   Annual gynecologic examination 46 y.o. Contraception: abstinence bmi-40 Problem List Items Addressed This Visit    Hypothyroidism    Other Visit Diagnoses    Well woman exam with routine gynecological exam    -  Primary   Family history of ovarian cancer       Hot flashes       Relevant Medications   cloNIDine (CATAPRES) 0.1 MG tablet   SUI (stress urinary incontinence, female)       Class 3 obesity due to excess calories without serious comorbidity with body mass index (BMI) of 40.0 to 44.9 in adult [facility])       Tobacco user         Amenorrhea Plan:  Pap: Pap Co Test Mammogram: Ordered Stool Guaiac Testing:  Not Indicated Labs: thru pcp [facility] is ordered to assess amenorrhea state Routine preventative health maintenance measures emphasized: Exercise/Diet/Weight control, Tobacco Warnings, Alcohol/Substance use risks and Stress Management Pelvic ultrasound is ordered to assess adnexa due to family history of ovarian cancer and history of ovarian calcifications Kegel exercises encouraged; physical therapy consultation was past but declined at this time Return to [place], CMA  Brayton Mars, MD   Note: This dictation was prepared with Dragon dictation along with smaller phrase technology. Any transcriptional errors that result from this process are unintentional.

## 2017-04-02 NOTE — Patient Instructions (Signed)
1. Pap smear is done 2. Mammogram is ordered 3. [place] is ordered to assess for menopausal state 4. Ultrasound is scheduled to assess adnexa (ovaries) 5. Continue with healthy eating, exercise, and control weight loss 6. Kegel exercises are encouraged to help with stress incontinence; referral to physical therapy can be made to improve Kegel exercises quality 7. Return in 1 year for annual exam  Health Maintenance, Female Adopting a healthy lifestyle and getting preventive care can go a long way to promote health and wellness. Talk with your health care provider about what schedule of regular examinations is right for you. This is a good chance for you to check in with your provider about disease prevention and staying healthy. In between checkups, there are plenty of things you can do on your own. Experts have done a lot of research about which lifestyle changes and preventive measures are most likely to keep you healthy. Ask your health care provider for more information. Weight and diet Eat a healthy diet  Be sure to include plenty of vegetables, fruits, low-fat dairy products, and lean protein.  Do not eat a lot of foods high in solid fats, added sugars, or salt.  Get regular exercise. This is one of the most important things you can do for your health.  Most adults should exercise for at least 150 minutes each week. The exercise should increase your heart rate and make you sweat (moderate-intensity exercise).  Most adults should also do strengthening exercises at least twice a week. This is in addition to the moderate-intensity exercise. Maintain a healthy weight  Body mass index (BMI) is a measurement that can be used to identify possible weight problems. It estimates body fat based on height and weight. Your health care provider can help determine your BMI and help you achieve or maintain a healthy weight.  For females 78 years of age and older:  A BMI below 18.5 is considered  underweight.  A BMI of 18.5 to 24.9 is normal.  A BMI of 25 to 29.9 is considered overweight.  A BMI of 30 and above is considered obese. Watch levels of cholesterol and blood lipids  You should start having your blood tested for lipids and cholesterol at 45 years of age, then have this test every 5 years.  You may need to have your cholesterol levels checked more often if:  Your lipid or cholesterol levels are high.  You are older than 45 years of age.  You are at high risk for heart disease. Cancer screening Lung Cancer  Lung cancer screening is recommended for adults 53-11 years old who are at high risk for lung cancer because of a history of smoking.  A yearly low-dose CT scan of the lungs is recommended for people who:  Currently smoke.  Have quit within the past 15 years.  Have at least a 30-pack-year history of smoking. A pack year is smoking an average of one pack of cigarettes a day for 1 year.  Yearly screening should continue until it has been 15 years since you quit.  Yearly screening should stop if you develop a health problem that would prevent you from having lung cancer treatment. Breast Cancer  Practice breast self-awareness. This means understanding how your breasts normally appear and feel.  It also means doing regular breast self-exams. Let your health care provider know about any changes, no matter how small.  If you are in your 20s or 30s, you should have a clinical breast exam (  CBE) by a health care provider every 1-3 years as part of a regular health exam.  If you are 85 or older, have a CBE every year. Also consider having a breast X-ray (mammogram) every year.  If you have a family history of breast cancer, talk to your health care provider about genetic screening.  If you are at high risk for breast cancer, talk to your health care provider about having an MRI and a mammogram every year.  Breast cancer gene (BRCA) assessment is recommended  for women who have family members with BRCA-related cancers. BRCA-related cancers include:  Breast.  Ovarian.  Tubal.  Peritoneal cancers.  Results of the assessment will determine the need for genetic counseling and BRCA1 and BRCA2 testing. Cervical Cancer  Your health care provider may recommend that you be screened regularly for cancer of the pelvic organs (ovaries, uterus, and vagina). This screening involves a pelvic examination, including checking for microscopic changes to the surface of your cervix (Pap test). You may be encouraged to have this screening done every 3 years, beginning at age 1.  For women ages 56-65, health care providers may recommend pelvic exams and Pap testing every 3 years, or they may recommend the Pap and pelvic exam, combined with testing for human papilloma virus (HPV), every 5 years. Some types of HPV increase your risk of cervical cancer. Testing for HPV may also be done on women of any age with unclear Pap test results.  Other health care providers may not recommend any screening for nonpregnant women who are considered low risk for pelvic cancer and who do not have symptoms. Ask your health care provider if a screening pelvic exam is right for you.  If you have had past treatment for cervical cancer or a condition that could lead to cancer, you need Pap tests and screening for cancer for at least 20 years after your treatment. If Pap tests have been discontinued, your risk factors (such as having a new sexual partner) need to be reassessed to determine if screening should resume. Some women have medical problems that increase the chance of getting cervical cancer. In these cases, your health care provider may recommend more frequent screening and Pap tests. Colorectal Cancer  This type of cancer can be detected and often prevented.  Routine colorectal cancer screening usually begins at 46 years of age and continues through 45 years of age.  Your health  care provider may recommend screening at an earlier age if you have risk factors for colon cancer.  Your health care provider may also recommend using home test kits to check for hidden blood in the stool.  A small camera at the end of a tube can be used to examine your colon directly (sigmoidoscopy or colonoscopy). This is done to check for the earliest forms of colorectal cancer.  Routine screening usually begins at age 71.  Direct examination of the colon should be repeated every 5-10 years through 45 years of age. However, you may need to be screened more often if early forms of precancerous polyps or small growths are found. Skin Cancer  Check your skin from head to toe regularly.  Tell your health care provider about any new moles or changes in moles, especially if there is a change in a mole's shape or color.  Also tell your health care provider if you have a mole that is larger than the size of a pencil eraser.  Always use sunscreen. Apply sunscreen liberally and repeatedly  throughout the day.  Protect yourself by wearing long sleeves, pants, a wide-brimmed hat, and sunglasses whenever you are outside. Heart disease, diabetes, and high blood pressure  High blood pressure causes heart disease and increases the risk of stroke. High blood pressure is more likely to develop in:  People who have blood pressure in the high end of the normal range (130-139/85-89 mm Hg).  People who are overweight or obese.  People who are African American.  If you are 107-6 years of age, have your blood pressure checked every 3-5 years. If you are 64 years of age or older, have your blood pressure checked every year. You should have your blood pressure measured twice-once when you are at a hospital or clinic, and once when you are not at a hospital or clinic. Record the average of the two measurements. To check your blood pressure when you are not at a hospital or clinic, you can use:  An automated  blood pressure machine at a pharmacy.  A home blood pressure monitor.  If you are between 54 years and 63 years old, ask your health care provider if you should take aspirin to prevent strokes.  Have regular diabetes screenings. This involves taking a blood sample to check your fasting blood sugar level.  If you are at a normal weight and have a low risk for diabetes, have this test once every three years after 45 years of age.  If you are overweight and have a high risk for diabetes, consider being tested at a younger age or more often. Preventing infection Hepatitis B  If you have a higher risk for hepatitis B, you should be screened for this virus. You are considered at high risk for hepatitis B if:  You were born in a country where hepatitis B is common. Ask your health care provider which countries are considered high risk.  Your parents were born in a high-risk country, and you have not been immunized against hepatitis B (hepatitis B vaccine).  You have HIV or AIDS.  You use needles to inject street drugs.  You live with someone who has hepatitis B.  You have had sex with someone who has hepatitis B.  You get hemodialysis treatment.  You take certain medicines for conditions, including cancer, organ transplantation, and autoimmune conditions. Hepatitis C  Blood testing is recommended for:  Everyone born from [birth year] through 1964.  Anyone with known risk factors for hepatitis C. Sexually transmitted infections (STIs)  You should be screened for sexually transmitted infections (STIs) including gonorrhea and chlamydia if:  You are sexually active and are younger than 45 years of age.  You are older than 45 years of age and your health care provider tells you that you are at risk for this type of infection.  Your sexual activity has changed since you were last screened and you are at an increased risk for chlamydia or gonorrhea. Ask your health care provider if you are at  risk.  If you do not have HIV, but are at risk, it may be recommended that you take a prescription medicine daily to prevent HIV infection. This is called pre-exposure prophylaxis (PrEP). You are considered at risk if:  You are sexually active and do not regularly use condoms or know the HIV status of your partner(s).  You take drugs by injection.  You are sexually active with a partner who has HIV. Talk with your health care provider about whether you are at high risk of  being infected with HIV. If you choose to begin PrEP, you should first be tested for HIV. You should then be tested every 3 months for as long as you are taking PrEP. Pregnancy  If you are premenopausal and you may become pregnant, ask your health care provider about preconception counseling.  If you may become pregnant, take 400 to 800 micrograms (mcg) of folic acid every day.  If you want to prevent pregnancy, talk to your health care provider about birth control (contraception). Osteoporosis and menopause  Osteoporosis is a disease in which the bones lose minerals and strength with aging. This can result in serious bone fractures. Your risk for osteoporosis can be identified using a bone density scan.  If you are 31 years of age or older, or if you are at risk for osteoporosis and fractures, ask your health care provider if you should be screened.  Ask your health care provider whether you should take a calcium or vitamin D supplement to lower your risk for osteoporosis.  Menopause may have certain physical symptoms and risks.  Hormone replacement therapy may reduce some of these symptoms and risks. Talk to your health care provider about whether hormone replacement therapy is right for you. Follow these instructions at home:  Schedule regular health, dental, and eye exams.  Stay current with your immunizations.  Do not use any tobacco products including cigarettes, chewing tobacco, or electronic  cigarettes.  If you are pregnant, do not drink alcohol.  If you are breastfeeding, limit how much and how often you drink alcohol.  Limit alcohol intake to no more than 1 drink per day for nonpregnant women. One drink equals 12 ounces of beer, 5 ounces of wine, or 1 ounces of hard liquor.  Do not use street drugs.  Do not share needles.  Ask your health care provider for help if you need support or information about quitting drugs.  Tell your health care provider if you often feel depressed.  Tell your health care provider if you have ever been abused or do not feel safe at home. This information is not intended to replace advice given to you by your health care provider. Make sure you discuss any questions you have with your health care provider. Document Released: 06/18/2011 Document Revised: 05/10/2016 Document Reviewed: 09/06/2015 Elsevier Interactive Patient Education  2017 [employer].

## 2017-04-04 LAB — PAP IG AND HPV HIGH-RISK
HPV, high-risk: NEGATIVE
PAP Smear Comment: 0

## 2017-04-05 ENCOUNTER — Other Ambulatory Visit: Payer: Medicare Other

## 2017-04-08 ENCOUNTER — Ambulatory Visit (INDEPENDENT_AMBULATORY_CARE_PROVIDER_SITE_OTHER): Payer: Medicare Other

## 2017-04-08 DIAGNOSIS — Z8041 Family history of malignant neoplasm of ovary: Secondary | ICD-10-CM | POA: Diagnosis not present

## 2017-04-08 DIAGNOSIS — R232 Flushing: Secondary | ICD-10-CM | POA: Diagnosis not present

## 2017-04-09 LAB — FOLLICLE STIMULATING HORMONE: FSH: 7.4 m[IU]/mL

## 2017-04-10 ENCOUNTER — Encounter: Payer: Self-pay | Admitting: Obstetrics and Gynecology

## 2017-04-11 ENCOUNTER — Telehealth: Payer: Self-pay | Admitting: Obstetrics and Gynecology

## 2017-04-11 NOTE — Telephone Encounter (Signed)
Please call patient with US results. 

## 2017-04-12 ENCOUNTER — Encounter: Payer: Self-pay | Admitting: Family Medicine

## 2017-04-12 ENCOUNTER — Ambulatory Visit (INDEPENDENT_AMBULATORY_CARE_PROVIDER_SITE_OTHER): Payer: Medicare Other | Admitting: Family Medicine

## 2017-04-12 VITALS — BP 126/61 | HR 66 | Temp 99.2°F | Resp 16 | Ht 67.0 in | Wt 254.0 lb

## 2017-04-12 DIAGNOSIS — J3089 Other allergic rhinitis: Secondary | ICD-10-CM | POA: Diagnosis not present

## 2017-04-12 DIAGNOSIS — R002 Palpitations: Secondary | ICD-10-CM

## 2017-04-12 DIAGNOSIS — J0100 Acute maxillary sinusitis, unspecified: Secondary | ICD-10-CM

## 2017-04-12 DIAGNOSIS — H6983 Other specified disorders of Eustachian tube, bilateral: Secondary | ICD-10-CM | POA: Diagnosis not present

## 2017-04-12 DIAGNOSIS — H65191 Other acute nonsuppurative otitis media, right ear: Secondary | ICD-10-CM

## 2017-04-12 MED ORDER — AMOXICILLIN-POT CLAVULANATE 875-125 MG PO TABS: 1.0000 | ORAL_TABLET | Freq: Two times a day (BID) | ORAL | 0 refills | Status: DC

## 2017-04-12 NOTE — Patient Instructions (Signed)
Thank you for coming in to clinic today.  1. It sounds like you have a Sinusitis (Bacterial Infection)- can be from allergies and sinus inflammation, also with your ears you most likely have Eustachian tube dysfunction, poor draining and fluid back up behind ear drums  - Start Augmentin 1 pill twice daily (breakfast and dinner, with food and plenty of water) for 10 days, complete entire course, do not stop early even if feeling better - Continue zyrtec 10mg  daily and Flonase 2 sprays in each nostril daily for next 4-6 weeks, then you may stop and use seasonally or as needed - Improve hydration by drinking plenty of clear fluids (water, gatorade) to reduce secretions and thin congestion  If not improving with worsening ear pain, pressure, or worsening loss of hearing, notify our office and we can refer you to ENT for further evaluation  ---------------------------------------- For palpitations, this may be due to anxiety, medication, or caffeine try to limit caffeine intake.  If you do not hear back for appointment, call their office in 2-3 weeks to check status  [place]) [facility] [place], [place] [address] Main: [phone]   Please schedule a follow-up appointment with Dr. Parks Ranger in 2 weeks if sinus / ear pressure not improving  If you have any other questions or concerns, please feel free to call the clinic or send a message through Why. You may also schedule an earlier appointment if necessary.  [name], DO [place]

## 2017-04-12 NOTE — Assessment & Plan Note (Signed)
Suspected likely PVCs or multifactorial etiology with chronic anxiety, likely hyper-aware of palpitations now, also with large amount of caffeine consumption, active smoker. - Less likely to be from medications, but certainly possible - Exam normal, prior EKG reviewed unremarkable - Asymptomatic  Plan: 1. Reassurance, reviewed common causes of palpitations 2. Given patient's concern requested Cardiology referral for re-evaluation and likely repeat heart monitor if needed now with increased frequency

## 2017-04-12 NOTE — Progress Notes (Signed)
Subjective:    Patient ID: Amanda Webb, female    DOB: 1971-02-18, 46 y.o.   MRN: [id number]  Amanda Webb is a 46 y.o. female presenting on 04/12/2017 for Ear Pain (more right side onset week pt feels offbalance when she walk (dizzy little))  Patient presents for a same day appointment.  HPI   R EAR PAIN and PRESSURE / ALLERGIC RHINITIS vs SINUSITIS Reports symptoms started about 1 week ago Right ear pain and pressure, with some intermittent sharp stabbing pains suddenly throughout the day at different times. Now worsening to have Right mastoid pain with soreness. Her Left ear has some aching but no significant pain. Admits some mild muffled hearing with R ear sometimes has to increase volume of TV - Taking daily Zyrtec and Flonase regularly most of year. Prior history of allergy shots. - Admits feeling "hot in face" and "chilly everywhere else", has chronic elevated temperature - Admits intermittent sinus pain and pressure, with some thicker green nasal congestion - Denies any cough or shortness of breath  HEART PALPITATIONS: - Reports additional concern with increasing frequency of heart palpitations. Describes prior chronic problem in 1999, she wore a holter monitor at that time, did not capture abnormal beats was negative. Over many years she has noticed increasing frequency of "flutter" and then "thud" with heart beat, now over past 6-12 months seems to be worsening. - Admits to drinking a lot of caffeine, still active smoker. Also has significant chronic anxiety and panic, but does have palpitations randomly not only when anxious - She is requesting second opinion by Cardiologist - Other meds, she has been off of Clonazepam for months now. Her Psychiatrist had started her on Clonidine low dose 0.1 1-2 times daily for anxiety, but palpitations do not seem related to this, but concerned about low diastolic BP today  Social History  Substance Use Topics  . Smoking status:  Current Every Day Smoker    Packs/day: 0.50    Years: 30.00    Types: Cigarettes  . Smokeless tobacco: Current User  . Alcohol use No    Review of Systems Per HPI unless specifically indicated above     Objective:    BP 126/61   Pulse 66   Temp 99.2 F (37.3 C) (Oral)   Resp 16   Ht 5\' 7"  (1.702 m)   Wt 254 lb (115.2 kg)   LMP  (LMP Unknown)   SpO2 98%   BMI 39.78 kg/m   Wt Readings from Last 3 Encounters:  04/12/17 254 lb (115.2 kg)  04/02/17 256 lb 6.4 oz (116.3 kg)  12/28/16 250 lb 9.6 oz (113.7 kg)    Physical Exam  Constitutional: She is oriented to person, place, and time. She appears well-developed and well-nourished. No distress.  Mostly well-appearing, slight discomfort with sinus/ear pain, cooperative  HENT:  Head: Normocephalic and atraumatic.  Bilateral maxillary sinuses mild tender. Nares mostly patent still with some deeper turbinate edema with some congestion without purulence.   External ear tragus non tender. No ear canal debris or drainage.  R TM with significant fullness without bulging, some mildly opaque effusion present without erythema, loss of normal landmarks.  L TM mostly normal landmarks, mild clear effusion some fullness, less compared to R. No erythema.  Mild tenderness to palpation R mastoid process and below along neck to R angle of mandible. Without erythema, edema, or lymphadenopathy.  Moist mucus mem. Oropharynx with some mild cobblestoning with post nasal drainage without exudates,  edema or asymmetry.  Eyes: Conjunctivae are normal. Right eye exhibits no discharge. Left eye exhibits no discharge.  Neck: Normal range of motion. Neck supple. No thyromegaly present.  Cardiovascular: Normal rate, regular rhythm, normal heart sounds and intact distal pulses.   No murmur heard. No ectopy heard.  Pulmonary/Chest: Effort normal and breath sounds normal. No respiratory distress. She has no wheezes. She has no rales.  Good air movement.    Lymphadenopathy:    She has no cervical adenopathy.  Neurological: She is alert and oriented to person, place, and time.  Skin: Skin is warm and dry. She is not diaphoretic.  Psychiatric: She has a normal mood and affect. Her behavior is normal.  Well groomed, good eye contact, normal speech and thoughts. Mildly anxious appearing.  Nursing note and vitals reviewed.      Assessment & Plan:   Problem List Items Addressed This Visit    Heart palpitations    Suspected likely PVCs or multifactorial etiology with chronic anxiety, likely hyper-aware of palpitations now, also with large amount of caffeine consumption, active smoker. - Less likely to be from medications, but certainly possible - Exam normal, prior EKG reviewed unremarkable - Asymptomatic  Plan: 1. Reassurance, reviewed common causes of palpitations 2. Given patient's concern requested Cardiology referral for re-evaluation and likely repeat heart monitor if needed now with increased frequency      Relevant Orders   Ambulatory referral to Cardiology   Allergic rhinitis - Stable chronic problem, likely contributing to chronic allergies/sinus problems    Acute non-recurrent maxillary sinusitis  Consistent with acute maxillary sinusitis, likely initially allergic rhinitis component with possible allergic sinusitis vs concern for bacterial infection given constellation of symptoms, temperature instability, duration. - Concern with R mastoid pain on palpation but clinically seems to be more referred pain from R ear given distribution  Plan: 1. Reviewed prognosis, ultimately agree to start Augmentin 875-125mg  PO BID x 10 days 2. Continue Zyrtec 10mg  daily and Flonase 2 sprays in each nostril daily for next 4-6 weeks, then may stop and use seasonally or as needed 3. Return criteria reviewed - in future if refractory consider referral ENT with persistent R ear effusion, she has had history of tymp tubes in past and had chronic  sinus problems     Relevant Medications   amoxicillin-clavulanate (AUGMENTIN) 875-125 MG tablet    Other Visit Diagnoses    Acute effusion of right ear    -  Primary  See A&P above underlying etiology allergic rhinitis / sinusitis    Relevant Medications   amoxicillin-clavulanate (AUGMENTIN) 875-125 MG tablet   Eustachian tube dysfunction, bilateral    Likely contributing factor to effusion, resulting from sinus pressure.       Meds ordered this encounter  Medications  . amoxicillin-clavulanate (AUGMENTIN) 875-125 MG tablet    Sig: Take 1 tablet by mouth 2 (two) times daily. For 10 days    Dispense:  20 tablet    Refill:  0      Follow up plan: Return in about 2 years (around 04/13/2019), or if symptoms worsen or fail to improve, for sinus / ear pressure not improving.  [name], [facility] 04/12/2017, 9:51 PM

## 2017-04-17 ENCOUNTER — Telehealth: Payer: Self-pay | Admitting: Obstetrics and Gynecology

## 2017-04-17 NOTE — Telephone Encounter (Signed)
Please call patient with the [place] results  No response from last message per patient

## 2017-04-17 NOTE — Telephone Encounter (Signed)
Pt aware u/s wnl. Needs ae in 1 year.

## 2017-04-18 DIAGNOSIS — F4310 Post-traumatic stress disorder, unspecified: Secondary | ICD-10-CM | POA: Diagnosis not present

## 2017-04-25 ENCOUNTER — Encounter: Payer: Self-pay | Admitting: Obstetrics and Gynecology

## 2017-05-05 DIAGNOSIS — R05 Cough: Secondary | ICD-10-CM | POA: Diagnosis not present

## 2017-05-05 DIAGNOSIS — R0689 Other abnormalities of breathing: Secondary | ICD-10-CM | POA: Diagnosis not present

## 2017-05-08 ENCOUNTER — Ambulatory Visit (INDEPENDENT_AMBULATORY_CARE_PROVIDER_SITE_OTHER): Payer: Medicare Other | Admitting: Family Medicine

## 2017-05-08 ENCOUNTER — Encounter: Payer: Self-pay | Admitting: Family Medicine

## 2017-05-08 ENCOUNTER — Ambulatory Visit
Admission: RE | Admit: 2017-05-08 | Discharge: 2017-05-08 | Disposition: A | Discharge status: Home / Self Care | Payer: Medicare Other | Source: Ambulatory Visit | Attending: Family Medicine | Admitting: Family Medicine

## 2017-05-08 VITALS — BP 148/68 | HR 58 | Temp 99.6°F | Resp 16 | Ht 67.0 in | Wt 256.0 lb

## 2017-05-08 DIAGNOSIS — R0602 Shortness of breath: Secondary | ICD-10-CM | POA: Diagnosis not present

## 2017-05-08 DIAGNOSIS — Z720 Tobacco use: Secondary | ICD-10-CM

## 2017-05-08 DIAGNOSIS — J209 Acute bronchitis, unspecified: Secondary | ICD-10-CM | POA: Diagnosis not present

## 2017-05-08 DIAGNOSIS — R05 Cough: Secondary | ICD-10-CM | POA: Diagnosis not present

## 2017-05-08 NOTE — Patient Instructions (Signed)
Thank you for coming to the clinic today.  1. It sounds like you had an Upper Respiratory Virus that has settled into a Bronchitis, lower respiratory tract infection. I don't have concerns for pneumonia today, and think that this should gradually improve. Once you are feeling better, the cough may take a few weeks to fully resolve. I do hear coarse breath sounds, this may be due to the virus or pneumonia, also could be related to smoking.  Finish Prednisone and Azithromycin - it is okay to have BP up to 140/90s and even higher temporarily while on Prednisone.  Continue Albuterol as needed 2 puffs every 4-6 hours around the clock for next 2-3 days, max up to 5 days then use as needed  Continue Zyrtec, Flonase  May use cough syrup, or add Mucinex-DM 1-2 times daily for 1 week  - Drink plenty of fluids to improve congestion   If your symptoms seem to worsen instead of improve over next several days, including significant fever / chills, worsening shortness of breath, worsening wheezing, or nausea / vomiting and can't take medicines - return sooner or go to hospital Emergency Department for more immediate treatment.  Referral to Pulmonology lung specialist to do pulmonary function testing, may actually have COPD or asthma causing this recurrent problem  [facility] [address] [place], [place] Phone: [phone]  Please schedule a follow-up appointment with Dr. Parks Ranger in 2 weeks if breathing / bronchitis is not improved only if needed ,otherwise need to see pulmonologist first  If you have any other questions or concerns, please feel free to call the clinic or send a message through [place]. You may also schedule an earlier appointment if necessary.  [name], DO [place]

## 2017-05-08 NOTE — Progress Notes (Addendum)
Subjective:    Patient ID: Amanda Webb, female    DOB: 10-23-1971, 46 y.o.   MRN: [id number]  Amanda Webb is a 46 y.o. female presenting on 05/08/2017 for Cough (pt was last seen 04/27 for ear infection and after finishing antibiotic went to [facility] past Sunday and diagnosed with pneumonia and now cough is coming back chills ear pain but not sore throat)   HPI   FOLLOW-UP URGENT CARE / Bronchitis vs Pneumonia: - Chart review of recent history treated for bronchitis in 11/2016 (azithromycin then switched to levaquin), AOM / sinusitis in 03/2017 treated with Augmentin, now here for bronchitis again, reports she has "only been healthy for 6 weeks in 7 months" - Today reports symptoms started few days after completing last antibiotic about 3 weeks ago, gradual worsening with some URI symptoms cough congestion, then worsening now for past 3 days, recently has used Albuterol 3-4 times a day with some relief, was not using it regularly previously. No prior PFTs or established with Pulmonology - Sick contacts at home with pneumonia and other illnesses - History of allergies taking Flonase daily, Zyrtec - She went [facility] on 5/20, diagnosed with bronchitis but told that she probably has pneumonia, given Azithormycin z-pak, prednisone taper, cough syrup - Admits temperature instability, chronic elevated temp but now seems to fluctuate - Admits some dyspnea at times, productive cough and feels difficulty breathing at times - Denies chest pain, swelling, hemoptysis  Tobacco Abuse Chronic smoker >30 years, currently still smoking 0.5ppd Describes the severity of her addiction to smoking / nicotine, and would "like to quit" but cannot and is not ready  Social History  Substance Use Topics  . Smoking status: Current Every Day Smoker    Packs/day: 0.50    Years: 30.00    Types: Cigarettes  . Smokeless tobacco: Current User  . Alcohol use No    Review of Systems Per  HPI unless specifically indicated above     Objective:    BP (!) 148/68   Pulse (!) 58   Temp 99.6 F (37.6 C) (Oral)   Resp 16   Ht 5\' 7"  (1.702 m)   Wt 256 lb (116.1 kg)   SpO2 98%   BMI 40.10 kg/m   Wt Readings from Last 3 Encounters:  05/08/17 256 lb (116.1 kg)  04/12/17 254 lb (115.2 kg)  04/02/17 256 lb 6.4 oz (116.3 kg)    Physical Exam  Constitutional: She is oriented to person, place, and time. She appears well-developed and well-nourished. No distress.  Mostly well appearing, slightly tired and uncomfortable with occasional cough, cooperative, obese  HENT:  Head: Normocephalic and atraumatic.  Mouth/Throat: Oropharynx is clear and moist.  Frontal / maxillary sinuses non-tender. Nares patent without purulence or edema. Bilateral TMs clear without erythema, effusion or bulging. Oropharynx clear without erythema, exudates, edema or asymmetry.  Eyes: Conjunctivae are normal. Right eye exhibits no discharge. Left eye exhibits no discharge.  Neck: Normal range of motion. Neck supple.  Cardiovascular: Normal rate, regular rhythm, normal heart sounds and intact distal pulses.   No murmur heard. Pulmonary/Chest: Effort normal. No respiratory distress. She has no wheezes. She has no rales.  Mild coarse breath sounds bilateral bases with some reduced air movement. Speaks full sentences. Occasional coughing.  Musculoskeletal: She exhibits no edema.  Lymphadenopathy:    She has no cervical adenopathy.  Neurological: She is alert and oriented to person, place, and time.  Skin: Skin is  warm and dry. No rash noted. She is not diaphoretic. No erythema.  Psychiatric: She has a normal mood and affect. Her behavior is normal.  Nursing note and vitals reviewed.   I have personally reviewed the radiology report from Chest X-ray on 05/08/17.  CLINICAL DATA:  Pneumonia diagnosed at urgent care on Sunday, recurrent bronchitis, and productive cough with yellow to white material for 3  weeks, shortness of breath worsened in last 2 weeks, 30 year smoking history, history hypertension, arrhythmia  EXAM: CHEST  2 VIEW  COMPARISON:  11/29/2016  FINDINGS: Normal heart size, mediastinal contours, and pulmonary vascularity.  Lungs clear.  No pleural effusion or pneumothorax.  Bones unremarkable.  IMPRESSION: No acute abnormalities.   Electronically Signed   By: Lavonia Dana M.D.   On: 05/08/2017 17:38     Assessment & Plan:   Problem List Items Addressed This Visit    Tobacco abuse    Active smoker, not ready to quit Concern for possible underlying early COPD with recurrent bronchitis Counseling provided on smoking cessation, not interested today      Relevant Orders   DG Chest 2 View (Completed)   Ambulatory referral to Pulmonology    Other Visit Diagnoses    Acute bronchitis, unspecified organism    -  Primary  Consistent with another episode bronchitis in setting of likely viral URI (+sick contacts), duration < 1 week - Afebrile, no focal signs of infection (not consistent with pneumonia by history or exam), no evidence sinusitis. Mild coarse breath sounds on exam without wheezing, with some concern for bronchospasm (without history of COPD, but active smoker), mild reduced air movement.  - Suspect some recurrence may be sick contacts and exposure  Plan: 1. Continue existing treatment with Azithromycin and Prednisone that has already been started by Urgent Care. Do not need to switch antibiotics 2. Check Chest X-ray today - results were negative for infiltrate, normal appearance without hyperinflation, notified of result, reassurance 3. May continue Albuterol PRN rescue, continue other symptomatic medications 4. Referral to [facility] Pulmonology for formal PFTs and evaluation to consider possible COPD vs Asthma diagnosis or other etiology of recurrent bronchitis illness  5. Return criteria reviewed, follow-up within 1-2 week if not improved       Relevant Orders   DG Chest 2 View (Completed)   Ambulatory referral to Pulmonology      Additionally - counseled that her BP is likely to be elevated on Prednisone burst, and HR elevated with albuterol, it is okay for temporary BP >140/90, can keep monitoring, especially once finished with medication and improving clinically less coughing should improve. But if persistent elevated >140/90 can notify office or follow-up sooner.   Current Outpatient Prescriptions:  .  albuterol (PROVENTIL HFA;VENTOLIN HFA) 108 (90 Base) MCG/ACT inhaler, Inhale 2 puffs into the lungs every 6 (six) hours as needed for wheezing or shortness of breath., Disp: 1 Inhaler, Rfl: 0 .  atenolol (TENORMIN) 50 MG tablet, Take 1 tablet (50 mg total) by mouth daily., Disp: 30 tablet, Rfl: 11 .  azithromycin (ZITHROMAX) 250 MG tablet, Take by mouth daily., Disp: , Rfl:  .  Cetirizine HCl (ZYRTEC ALLERGY) 10 MG CAPS, , Disp: , Rfl:  .  EPINEPHrine (EPIPEN 2-PAK) 0.3 mg/0.3 mL IJ SOAJ injection, Inject 0.3 mLs (0.3 mg total) into the muscle once., Disp: 1 Device, Rfl: 0 .  fluticasone (FLONASE) 50 MCG/ACT nasal spray, Place 2 sprays into both nostrils daily., Disp: , Rfl:  .  fluvoxaMINE (LUVOX)  100 MG tablet, Take 150 mg by mouth 2 (two) times daily., Disp: , Rfl:  .  hydrocortisone 2.5 % cream, Apply topically., Disp: , Rfl:  .  ketoconazole (NIZORAL) 2 % cream, Apply 1 application topically daily as needed for irritation., Disp: , Rfl:  .  ketoconazole (NIZORAL) 2 % shampoo, Apply 1 application topically 2 (two) times a week., Disp: , Rfl:  .  lamoTRIgine (LAMICTAL) 200 MG tablet, Take 200 mg by mouth daily., Disp: , Rfl:  .  levothyroxine (SYNTHROID, LEVOTHROID) 50 MCG tablet, take 1 tablet by mouth every morning, Disp: 30 tablet, Rfl: 5 .  pantoprazole (PROTONIX) 20 MG tablet, Take 20 mg by mouth daily., Disp: , Rfl:  .  predniSONE (DELTASONE) 20 MG tablet, take 2 tablets by mouth once daily with food, Disp: , Rfl: 0 .   QUEtiapine (SEROQUEL) 50 MG tablet, Take 50 mg by mouth 3 (three) times daily., Disp: , Rfl:  .  ROBAFEN AC 100-10 MG/5ML syrup, take 5 milliliters by mouth at bedtime if needed, Disp: , Rfl: 0 .  triamcinolone cream (KENALOG) 0.1 %, Apply topically., Disp: , Rfl:  .  cloNIDine (CATAPRES) 0.1 MG tablet, , Disp: , Rfl:   Follow up plan: Return in about 2 weeks (around 05/22/2017), or if symptoms worsen or fail to improve, for bronchitis.  [name], [facility] 05/09/2017, 6:23 AM

## 2017-05-09 ENCOUNTER — Encounter: Payer: Self-pay | Admitting: Obstetrics and Gynecology

## 2017-05-09 ENCOUNTER — Other Ambulatory Visit: Payer: Self-pay

## 2017-05-09 DIAGNOSIS — Z720 Tobacco use: Secondary | ICD-10-CM | POA: Insufficient documentation

## 2017-05-09 MED ORDER — MEDROXYPROGESTERONE ACETATE 150 MG/ML IM SUSP
150.0000 mg | INTRAMUSCULAR | 1 refills | Status: DC
Start: 2017-05-09 — End: 2017-10-25

## 2017-05-09 NOTE — Telephone Encounter (Signed)
Patient called and requests to start the depo shot - she will need the medication called into [place]

## 2017-05-09 NOTE — Assessment & Plan Note (Signed)
Active smoker, not ready to quit Concern for possible underlying early COPD with recurrent bronchitis Counseling provided on smoking cessation, not interested today

## 2017-05-10 ENCOUNTER — Inpatient Hospital Stay: Payer: Medicare Other | Admitting: Family Medicine

## 2017-05-22 ENCOUNTER — Encounter: Payer: Self-pay | Admitting: Obstetrics and Gynecology

## 2017-05-27 ENCOUNTER — Ambulatory Visit (INDEPENDENT_AMBULATORY_CARE_PROVIDER_SITE_OTHER): Payer: Medicare Other | Admitting: Obstetrics and Gynecology

## 2017-05-27 VITALS — BP 124/67 | HR 72 | Wt 254.0 lb

## 2017-05-27 DIAGNOSIS — N939 Abnormal uterine and vaginal bleeding, unspecified: Secondary | ICD-10-CM

## 2017-05-27 MED ORDER — MEDROXYPROGESTERONE ACETATE 150 MG/ML IM SUSP
150.0000 mg | Freq: Once | INTRAMUSCULAR | Status: AC
  Administered 2017-05-27: 150 mg via INTRAMUSCULAR

## 2017-05-27 NOTE — Progress Notes (Signed)
Patient ID: Amanda Webb, female   DOB: Feb 15, 1971, 46 y.o.   MRN: [id number] Date last pap: na. Last Depo-Provera: August 2017 Side Effects if any: NA, at this time Serum HCG indicated? NA (pt states she started and remains on menses 05/24/2017). Depo-Provera 150 mg IM given by: DR, LPN today Next appointment due 8/27 thru 08/26/2017.  Pt states she is having heaving vaginal bleeding/clots, changing pad every 3 hrs. Pt states it has been years since her last period. I informed pt that it could take several injections to hopefully stop/significantly decrease heavy bleeding. Protocol for ER visit given to pt for heavy bleeding.

## 2017-05-28 ENCOUNTER — Ambulatory Visit (INDEPENDENT_AMBULATORY_CARE_PROVIDER_SITE_OTHER): Payer: Medicare Other | Admitting: Family Medicine

## 2017-05-28 VITALS — BP 117/60 | HR 72 | Temp 99.8°F | Resp 16 | Ht 67.0 in | Wt 253.0 lb

## 2017-05-28 DIAGNOSIS — J410 Simple chronic bronchitis: Secondary | ICD-10-CM

## 2017-05-28 DIAGNOSIS — R222 Localized swelling, mass and lump, trunk: Secondary | ICD-10-CM

## 2017-05-28 NOTE — Patient Instructions (Addendum)
Thank you for coming to the clinic today.  1. I am not entirely sure the nature of the chest nodule - it could be slightly enlarged lymph node or it could be more benign lipoma or benign nodule  Try to avoid rubbing it and irritating it as it can make it more tender and swollen, cause it to be inflammed  Try to put a band aid on it to avoid pushing on it  I do not feel any other lymph nodes  If it is lymph node likely reactive due to sinus allergy symptoms  Monitor it for now - if not improved by 4-6 weeks can follow-up re-evaluation consider ultrasound  Please schedule a Follow-up Appointment to: Return in about 6 weeks (around 07/09/2017) for Swollen Nodule on Chest f/u.  If you have any other questions or concerns, please feel free to call the clinic or send a message through [place]. You may also schedule an earlier appointment if necessary.  [name], DO [place]

## 2017-05-28 NOTE — Progress Notes (Signed)
Subjective:    Patient ID: [name], female    DOB: 30-Jul-1971, 46 y.o.   MRN: [id number]  Eleanna Theilen is a 46 y.o. female presenting on 05/28/2017 for Cyst (collar bone left side onset 3 days painful to touch)   HPI   Left Chest Wall Nodule: - Reports new complaint, noticed only 3 days ago inadvertently while scratching in this area, "felt a knot" and was concerned about it, she kept feeling it and checking it and thinks she may have irritated it, admits mild tenderness. Today thinks it is slightly improved and smaller in size. No other areas of swelling or nodules noticed. Admits some recent sinus problems and chronic recurrent sinusitis vs bronchitis. No similar problems with lymph nodes. - Denies fevers/chills but admits to chronic temperature instability with reported temp down to 96.62F yesterday and has been put to 72F often with chronic low grade elevated temp, followed by GYN, thought to be perimenopausal vs hormone related  PMH - Chronic COPD bronchitis, thought to be form of COPD but no distinct diagnosis. Last flare 04/2017. No recent problems.    Social History  Substance Use Topics  . Smoking status: Current Every Day Smoker    Packs/day: 0.50    Years: 30.00    Types: Cigarettes  . Smokeless tobacco: Current User  . Alcohol use No    Review of Systems Per HPI unless specifically indicated above     Objective:    BP 117/60   Pulse 72   Temp 99.8 F (37.7 C) (Oral)   Resp 16   Ht 5\' 7"  (1.702 m)   Wt 253 lb (114.8 kg)   LMP 05/24/2017 (Exact Date)   BMI 39.63 kg/m   Wt Readings from Last 3 Encounters:  05/28/17 253 lb (114.8 kg)  05/27/17 254 lb (115.2 kg)  05/08/17 256 lb (116.1 kg)    Physical Exam  Constitutional: She appears well-developed and well-nourished. No distress.  Well-appearing, comfortable, cooperative  HENT:  Head: Normocephalic and atraumatic.  Mouth/Throat: Oropharynx is clear and moist.  Frontal / maxillary  sinuses non-tender. Nares patent with mild turbinate edema with mild congestion without purulence. Bilateral TMs clear without erythema, effusion or bulging. Oropharynx clear without erythema, exudates, edema or asymmetry.  Eyes: Conjunctivae are normal. Right eye exhibits no discharge. Left eye exhibits no discharge.  Neck: Normal range of motion. Neck supple.  Cardiovascular: Normal rate.   Pulmonary/Chest: Effort normal and breath sounds normal. No respiratory distress. She has no wheezes. She has no rales.    Lymphadenopathy:       Head (right side): No submental, no submandibular, no tonsillar, no preauricular, no posterior auricular and no occipital adenopathy present.       Head (left side): No submental, no submandibular, no tonsillar, no preauricular, no posterior auricular and no occipital adenopathy present.    She has no cervical adenopathy.       Right cervical: No superficial cervical, no deep cervical and no posterior cervical adenopathy present.      Left cervical: No superficial cervical, no deep cervical and no posterior cervical adenopathy present.    She has no axillary adenopathy.       Right axillary: No pectoral and no lateral adenopathy present.       Left axillary: No pectoral and no lateral adenopathy present.      Right: No supraclavicular adenopathy present.       Left: No supraclavicular adenopathy present.  Skin: Skin is  warm and dry. She is not diaphoretic.  Nursing note and vitals reviewed.  Results for orders placed or performed in visit on 41/93/79  Follicle stimulating hormone  Result Value Ref Range   FSH 7.4 mIU/mL  Pap IG and HPV (high risk) DNA detection  Result Value Ref Range   DIAGNOSIS: Comment    Specimen adequacy: Comment    CLINICIAN PROVIDED ICD10: Comment    Performed by: Comment    PAP Smear Comment .    Note: Comment    Test Methodology Comment    HPV, high-risk Negative Negative      Assessment & Plan:   Problem List Items  Addressed This Visit    Simple chronic bronchitis ([place])    Stable currently Last flare 04/2017 No formal PFTs, consider maintenance inhalers in future if needed       Other Visit Diagnoses    Nodule of chest wall    -  Primary  Suspected benign isolated acute L chest wall LAD vs other benign nodular density (lipoma vs MSK) x 3 days, maybe provoked by repetitive palpation vs reactive with sinusitis / recurrent bronchitis, not consistent with focal infection vs abscess given exam and lack of other symptoms. No additional concerning symptoms for more systemic problem vs malignancy.   Plan: 1. Reassurance, likely self limited within 4-6 weeks, no treatment today 2. Monitor for change, inc size, pain, swelling, other LAD, worsening symptoms, return criteria given 3. Follow-up if not resolving in 4-6 weeks to re-check - consider [place], may consider referral to General Surgery if dramatic worsening and concern about potential biopsy only     No orders of the defined types were placed in this encounter.     Follow up plan: Return in about 6 weeks (around 07/09/2017) for Swollen Nodule on Chest f/u.  [name], [facility] 05/28/2017, 11:58 PM

## 2017-05-29 ENCOUNTER — Encounter: Payer: Self-pay | Admitting: Family Medicine

## 2017-05-29 NOTE — Assessment & Plan Note (Signed)
Stable currently Last flare 04/2017 No formal PFTs, consider maintenance inhalers in future if needed

## 2017-06-03 ENCOUNTER — Encounter: Payer: Self-pay | Admitting: Obstetrics and Gynecology

## 2017-06-10 NOTE — Progress Notes (Deleted)
[facility] Consultation      Assessment and Plan:     Date: 06/10/2017  MRN# [id number] Laquetta Racey Dec 12, 1971  Referring Physician:   Leida Luton is a 46 y.o. old female seen in consultation for chief complaint of:   No chief complaint on file.   HPI:  The patient is a 46 year old smoker, she was recently seen by her primary care physician with an episode of acute bronchitis, requiring a course of antibiotics and prednisone. She's had frequent flareups of respiratory disease/upper respiratory tract infections, over the last year.  I personally reviewed. Chest x-ray images 05/08/17; mild hyperinflation, changes of chronic bronchitis. PMHX:   Past Medical History:  Diagnosis Date  . Allergy   . Anxiety   . Arrhythmia   . Arthritis   . Cystitis   . Depression   . GERD (gastroesophageal reflux disease)   . Gross hematuria   . Heart murmur   . HLD (hyperlipidemia)   . Hypertension   . Hypothyroid   . Tobacco abuse    Surgical Hx:  Past Surgical History:  Procedure Laterality Date  . CARPAL TUNNEL RELEASE Bilateral 2011  . FOOT SURGERY Right 2013   Plantar fascia  . TONSILLECTOMY  1979   Family Hx:  Family History  Problem Relation Age of Onset  . Depression Mother   . Mental illness Mother   . Alcohol abuse Mother   . Cancer Maternal Grandmother   . Diabetes Maternal Grandmother   . Kidney disease Maternal Grandfather   . Diabetes Father   . Mental illness Father   . Depression Father   . Drug abuse Father   . Depression Paternal Grandfather   . Drug abuse Paternal Grandfather   . Bladder Cancer Neg Hx    Social Hx:   Social History  Substance Use Topics  . Smoking status: Current Every Day Smoker    Packs/day: 0.50    Years: 30.00    Types: Cigarettes  . Smokeless tobacco: Current User  . Alcohol use No   Medication:    Current Outpatient Prescriptions:  .  albuterol (PROVENTIL HFA;VENTOLIN HFA) 108 (90 Base)  MCG/ACT inhaler, Inhale 2 puffs into the lungs every 6 (six) hours as needed for wheezing or shortness of breath., Disp: 1 Inhaler, Rfl: 0 .  atenolol (TENORMIN) 50 MG tablet, Take 1 tablet (50 mg total) by mouth daily., Disp: 30 tablet, Rfl: 11 .  Cetirizine HCl (ZYRTEC ALLERGY) 10 MG CAPS, , Disp: , Rfl:  .  cloNIDine (CATAPRES) 0.1 MG tablet, , Disp: , Rfl:  .  EPINEPHrine (EPIPEN 2-PAK) 0.3 mg/0.3 mL IJ SOAJ injection, Inject 0.3 mLs (0.3 mg total) into the muscle once., Disp: 1 Device, Rfl: 0 .  fluticasone (FLONASE) 50 MCG/ACT nasal spray, Place 2 sprays into both nostrils daily., Disp: , Rfl:  .  fluvoxaMINE (LUVOX) 100 MG tablet, Take 150 mg by mouth 2 (two) times daily., Disp: , Rfl:  .  hydrocortisone 2.5 % cream, Apply topically., Disp: , Rfl:  .  ketoconazole (NIZORAL) 2 % cream, Apply 1 application topically daily as needed for irritation., Disp: , Rfl:  .  ketoconazole (NIZORAL) 2 % shampoo, Apply 1 application topically 2 (two) times a week., Disp: , Rfl:  .  lamoTRIgine (LAMICTAL) 200 MG tablet, Take 200 mg by mouth daily., Disp: , Rfl:  .  levothyroxine (SYNTHROID, LEVOTHROID) 50 MCG tablet, take 1 tablet by mouth every morning, Disp: 30 tablet, Rfl: 5 .  medroxyPROGESTERone (DEPO-PROVERA) 150 MG/ML injection, Inject 1 mL (150 mg total) into the muscle every 3 (three) months., Disp: 1 mL, Rfl: 1 .  pantoprazole (PROTONIX) 20 MG tablet, Take 20 mg by mouth daily., Disp: , Rfl:  .  QUEtiapine (SEROQUEL) 50 MG tablet, Take 50 mg by mouth 3 (three) times daily., Disp: , Rfl:  .  ROBAFEN AC 100-10 MG/5ML syrup, take 5 milliliters by mouth at bedtime if needed, Disp: , Rfl: 0 .  triamcinolone cream (KENALOG) 0.1 %, Apply topically., Disp: , Rfl:    Allergies:  Bee venom; Cat hair extract; Tetracyclines & related; Lac bovis; Lactose; and Tape  Review of Systems: Gen:  Denies  fever, sweats, chills HEENT: Denies blurred vision, double vision. bleeds, sore throat Cvc:  No dizziness,  chest pain. Resp:   Denies cough or sputum production, shortness of breath Gi: Denies swallowing difficulty, stomach pain. Gu:  Denies bladder incontinence, burning urine Ext:   No Joint pain, stiffness. Skin: No skin rash,  hives  Endoc:  No polyuria, polydipsia. Psych: No depression, insomnia. Other:  All other systems were reviewed with the patient and were negative other that what is mentioned in the HPI.   Physical Examination:   VS: LMP 05/24/2017 (Exact Date)   General Appearance: No distress  Neuro:without focal findings,  speech normal,  HEENT: PERRLA, EOM intact.   Pulmonary: normal breath sounds, No wheezing.  CardiovascularNormal S1,S2.  No m/r/g.   Abdomen: Benign, Soft, non-tender. Renal:  No costovertebral tenderness  GU:  No performed at this time. Endoc: No evident thyromegaly, no signs of acromegaly. Skin:   warm, no rashes, no ecchymosis  Extremities: normal, no cyanosis, clubbing.  Other findings:    LABORATORY PANEL:   CBC No results for input(s): WBC, HGB, HCT, PLT in the last 168 hours. ------------------------------------------------------------------------------------------------------------------  Chemistries  No results for input(s): NA, K, CL, CO2, GLUCOSE, BUN, CREATININE, CALCIUM, MG, AST, ALT, ALKPHOS, BILITOT in the last 168 hours.  Invalid input(s): GFRCGP ------------------------------------------------------------------------------------------------------------------  Cardiac Enzymes No results for input(s): TROPONINI in the last 168 hours. ------------------------------------------------------------  RADIOLOGY:  No results found.     Thank  you for the consultation and for allowing [facility] to assist in the care of your patient. Our recommendations are noted above.  Please contact us if we can be of further service.   Marda Stalker, MD.  Board Certified in Internal Medicine, Pulmonary Medicine,  [place], and Sleep Medicine.  [facility] Office Number: [phone]  Patricia Pesa, M.D.  Merton Border, M.D  06/10/2017

## 2017-06-11 ENCOUNTER — Encounter: Payer: Self-pay | Admitting: Internal Medicine

## 2017-06-11 ENCOUNTER — Ambulatory Visit: Payer: Medicare Other | Admitting: Internal Medicine

## 2017-06-14 NOTE — Progress Notes (Signed)
Cardiology Office Note  Date:  06/17/2017   ID:  Webb, Amanda [birth year], MRN [id number]  PCP:  Olin Hauser, DO   Chief Complaint  Patient presents with  . other    New Patient. Referred by Karamalegos. Patient c/o Heart Palpitations. Meds reviewed verbally with patient.     HPI:  Ms. Dedic is a pleasant 46 year old woman with history of  morbid obesity, Prediabetes Long history of palpitations Active smoker Family history of coronary artery disease to presents by referral from Dr. Jerene Pitch for evaluation of her palpitations  Patient reports family member with prolonged QT She has had palpitations dating back to age 88 Sometimes reports having extra beat with a pause then a heartbeat once a day, sometimes up to 10 times a day  Denies any tachycardia episodes Having trouble losing weight Unable to quit smoking  Hemoglobin A1c 5.7 No exercise, poor diet Notes indicating that she took Metformin only took for 1 day and self discontinued due to nausea/vomiting GI intolerance.  Total 190, LDL 116  Did heart monitor for [employer] years ago  Family hx of CAD, all of the men, they were smokers  EKG personally reviewed by myself on todays visit Shows normal sinus rhythm with rate 87 bpm no significant ST or T-wave changes Normal QTC  PMH:   has a past medical history of Allergy; Anxiety; Arrhythmia; Arthritis; Cystitis; Depression; GERD (gastroesophageal reflux disease); Gross hematuria; Heart murmur; HLD (hyperlipidemia); Hypertension; Hypothyroid; and Tobacco abuse.  PSH:    Past Surgical History:  Procedure Laterality Date  . CARPAL TUNNEL RELEASE Bilateral 2011  . FOOT SURGERY Right 2013   Plantar fascia  . TONSILLECTOMY  1979    Current Outpatient Prescriptions  Medication Sig Dispense Refill  . albuterol (PROVENTIL HFA;VENTOLIN HFA) 108 (90 Base) MCG/ACT inhaler Inhale 2 puffs into the lungs every 6 (six) hours as needed for wheezing or  shortness of breath. 1 Inhaler 0  . atenolol (TENORMIN) 50 MG tablet Take 1 tablet (50 mg total) by mouth daily. 30 tablet 11  . Cetirizine HCl (ZYRTEC ALLERGY) 10 MG CAPS     . EPINEPHrine (EPIPEN 2-PAK) 0.3 mg/0.3 mL IJ SOAJ injection Inject 0.3 mLs (0.3 mg total) into the muscle once. 1 Device 0  . fluticasone (FLONASE) 50 MCG/ACT nasal spray Place 2 sprays into both nostrils daily.    . fluvoxaMINE (LUVOX) 100 MG tablet Take 150 mg by mouth 2 (two) times daily.    . hydrocortisone 2.5 % cream Apply topically.    Marland Kitchen ketoconazole (NIZORAL) 2 % cream Apply 1 application topically daily as needed for irritation.    Marland Kitchen ketoconazole (NIZORAL) 2 % shampoo Apply 1 application topically 2 (two) times a week.    . lamoTRIgine (LAMICTAL) 200 MG tablet Take 200 mg by mouth daily.    Marland Kitchen levothyroxine (SYNTHROID, LEVOTHROID) 50 MCG tablet take 1 tablet by mouth every morning 30 tablet 5  . medroxyPROGESTERone (DEPO-PROVERA) 150 MG/ML injection Inject 1 mL (150 mg total) into the muscle every 3 (three) months. 1 mL 1  . pantoprazole (PROTONIX) 20 MG tablet Take 20 mg by mouth daily.    . QUEtiapine (SEROQUEL) 50 MG tablet Take 50 mg by mouth 3 (three) times daily.    Marland Kitchen triamcinolone cream (KENALOG) 0.1 % Apply topically.     No current facility-administered medications for this visit.      Allergies:   Bee venom; Cat hair extract; Tetracyclines & related; Lac bovis; Lactose; and Tape  Social History:  The patient  reports that she has been smoking Cigarettes.  She has a 15.00 pack-year smoking history. She uses smokeless tobacco. She reports that she does not drink alcohol or use drugs.   Family History:   family history includes Alcohol abuse in her mother; Cancer in her maternal grandmother; Depression in her father, mother, and paternal grandfather; Diabetes in her father and maternal grandmother; Drug abuse in her father and paternal grandfather; Kidney disease in her maternal grandfather; Mental  illness in her father and mother.    Review of Systems: Review of Systems  Constitutional: Negative.   Respiratory: Negative.   Cardiovascular: Positive for palpitations.  Gastrointestinal: Negative.   Musculoskeletal: Negative.   Neurological: Negative.   Psychiatric/Behavioral: Negative.   All other systems reviewed and are negative.    PHYSICAL EXAM: VS:  BP 118/64 (BP Location: Left Arm, Patient Position: Sitting, Cuff Size: Normal)   Pulse 87   Ht 5\' 7"  (1.702 m)   Wt 253 lb 4 oz (114.9 kg)   LMP 05/24/2017 (Exact Date)   BMI 39.66 kg/m  , BMI Body mass index is 39.66 kg/m. GEN: Well nourished, well developed, in no acute distress , obese HEENT: normal  Neck: no JVD, carotid bruits, or masses Cardiac: RRR; no murmurs, rubs, or gallops,no edema  Respiratory:  clear to auscultation bilaterally, normal work of breathing GI: soft, nontender, nondistended, + BS MS: no deformity or atrophy  Skin: warm and dry, no rash Neuro:  Strength and sensation are intact Psych: euthymic mood, full affect    Recent Labs: 11/25/2016: ALT 16; BUN 5; Creatinine, Ser 0.71; Hemoglobin 12.2; Platelets 273; Potassium 3.6; Sodium 140 12/12/2016: TSH 1.32    Lipid Panel Lab Results  Component Value Date   CHOL 190 12/12/2016   HDL 44 (L) 12/12/2016   LDLCALC 116 (H) 12/12/2016   TRIG 151 (H) 12/12/2016      Wt Readings from Last 3 Encounters:  06/17/17 253 lb 4 oz (114.9 kg)  05/28/17 253 lb (114.8 kg)  05/27/17 254 lb (115.2 kg)       ASSESSMENT AND PLAN:  Essential hypertension - Plan: EKG 12-Lead Blood pressure is well controlled on today's visit. No changes made to the medications.  Mixed hyperlipidemia - Plan: EKG 12-Lead Total cholesterol reasonable We reviewed CT scan abdomen pelvis with her in detail showing no significant aortic atherosclerosis. No distal coronary calcifications  Heart palpitations - Plan: EKG 12-Lead Likely having APCs or PVCs Two-week  monitor ordered to identify her arrhythmia We did talk about increasing atenolol for rhythm control She feels she would not be able to do this given current levels of fatigue Likely poor candidate for antiarrhythmic medications We will call her with the results of her monitor  Tobacco abuse - Plan: EKG 12-Lead Long discussion concerning smoking cessation Unable to afford Chantix/.  We discussed other treatment options   Morbid obesity with BMI of 40.0-44.9, adult ([place]) - Plan: EKG 12-Lead We have encouraged continued exercise, careful diet management in an effort to lose weight.  Disposition:   F/U As needed We will call her with the results of her monitor  Patient was seen in consultation for primary care for her palpitations and will be referred back to primary care for management of the issues detailed above   Orders Placed This Encounter  Procedures  . EKG 12-Lead     Signed, Esmond Plants, M.D., Ph.D. 06/17/2017  [place], [place]

## 2017-06-17 ENCOUNTER — Ambulatory Visit (INDEPENDENT_AMBULATORY_CARE_PROVIDER_SITE_OTHER): Payer: Medicare Other | Admitting: Cardiovascular Disease

## 2017-06-17 ENCOUNTER — Encounter: Payer: Self-pay | Admitting: Cardiovascular Disease

## 2017-06-17 ENCOUNTER — Ambulatory Visit (INDEPENDENT_AMBULATORY_CARE_PROVIDER_SITE_OTHER): Payer: Medicare Other

## 2017-06-17 VITALS — BP 118/64 | HR 87 | Ht 67.0 in | Wt 253.2 lb

## 2017-06-17 DIAGNOSIS — R002 Palpitations: Secondary | ICD-10-CM | POA: Diagnosis not present

## 2017-06-17 DIAGNOSIS — E782 Mixed hyperlipidemia: Secondary | ICD-10-CM | POA: Diagnosis not present

## 2017-06-17 DIAGNOSIS — Z6841 Body Mass Index (BMI) 40.0 and over, adult: Secondary | ICD-10-CM | POA: Diagnosis not present

## 2017-06-17 DIAGNOSIS — I10 Essential (primary) hypertension: Secondary | ICD-10-CM | POA: Diagnosis not present

## 2017-06-17 DIAGNOSIS — Z720 Tobacco use: Secondary | ICD-10-CM

## 2017-06-17 NOTE — Patient Instructions (Addendum)
Medication Instructions:   No medication changes made  Labwork:  No new labs needed  Testing/Procedures:  We will order a zio patch for palpitations    Follow-Up: It was a pleasure seeing you in the office today. Please call us if you have new issues that need to be addressed before your next appt.  [phone]  Your physician wants you to follow-up in:  We will call you with the results of your monitor  If you need a refill on your cardiac medications before your next appointment, please call your pharmacy.

## 2017-06-18 DIAGNOSIS — F4310 Post-traumatic stress disorder, unspecified: Secondary | ICD-10-CM | POA: Diagnosis not present

## 2017-06-20 DIAGNOSIS — I10 Essential (primary) hypertension: Secondary | ICD-10-CM

## 2017-06-20 DIAGNOSIS — Z6841 Body Mass Index (BMI) 40.0 and over, adult: Secondary | ICD-10-CM | POA: Diagnosis not present

## 2017-06-20 DIAGNOSIS — R002 Palpitations: Secondary | ICD-10-CM

## 2017-06-20 DIAGNOSIS — E782 Mixed hyperlipidemia: Secondary | ICD-10-CM | POA: Diagnosis not present

## 2017-06-20 DIAGNOSIS — Z720 Tobacco use: Secondary | ICD-10-CM

## 2017-07-03 ENCOUNTER — Ambulatory Visit: Payer: Medicare Other | Admitting: Family Medicine

## 2017-07-06 DIAGNOSIS — R002 Palpitations: Secondary | ICD-10-CM | POA: Diagnosis not present

## 2017-07-15 ENCOUNTER — Encounter: Payer: Self-pay | Admitting: Cardiovascular Disease

## 2017-07-16 ENCOUNTER — Telehealth: Payer: Self-pay | Admitting: Cardiovascular Disease

## 2017-07-16 ENCOUNTER — Ambulatory Visit (INDEPENDENT_AMBULATORY_CARE_PROVIDER_SITE_OTHER): Payer: Medicare Other | Admitting: Internal Medicine

## 2017-07-16 ENCOUNTER — Encounter: Payer: Self-pay | Admitting: Internal Medicine

## 2017-07-16 VITALS — BP 134/70 | HR 66 | Resp 16 | Ht 67.0 in | Wt 253.0 lb

## 2017-07-16 DIAGNOSIS — J4530 Mild persistent asthma, uncomplicated: Secondary | ICD-10-CM | POA: Diagnosis not present

## 2017-07-16 DIAGNOSIS — R002 Palpitations: Secondary | ICD-10-CM

## 2017-07-16 MED ORDER — TIOTROPIUM BROMIDE MONOHYDRATE 1.25 MCG/ACT IN AERS: 1.2500 | INHALATION_SPRAY | Freq: Two times a day (BID) | RESPIRATORY_TRACT | 3 refills | Status: DC

## 2017-07-16 MED ORDER — PREDNISONE 20 MG PO TABS: 40.0000 mg | ORAL_TABLET | Freq: Every day | ORAL | 0 refills | Status: DC

## 2017-07-16 NOTE — Progress Notes (Signed)
Name: Amanda Webb MRN: [id number] DOB: [birth year]     CONSULTATION DATE: 07/16/17  REFERRING MD : Elvera Maria  CHIEF COMPLAINT:  Shortness of breath  STUDIES:  Chest x-ray on 05/08/2017 reviewed on 07/16/2017 Interpretation no acute findings no pneumonia no opacities   HISTORY OF PRESENT ILLNESS:   46 year old white female seen today for cough and with recurrent bronchitis Patient was recently diagnosed with pneumonia at the urgent care In December 2017 patient was given oral antibiotics for bronchitis In April 2018 patient was given antibiotics for bronchitis again Patient continues to smoke tobacco Patient states she would like to quit but cannot and is not ready at this time Patient does have history of allergic rhinitis and takes Flonase and Zyrtec daily Patient has intermittent wheezing and shortness of breath with exertion She is also exposed to extensive secondhand smoke Patient has no signs of infection at this time Patient does not show any signs of acute heart failure at this time Lower extremity edema at this time  I have strongly advised her to stop smoking and I have spent majority of the time counseling her approximately 15 minutes Patient has tried nicotine patches in the past and states that it has worked but her [employer] does not cover it patient wanted to try Chantix however her insurance company has not paid for next line patient has smoked one pack a day for the last 30 years Patient weighs 254 pounds   PAST MEDICAL HISTORY :   has a past medical history of Allergy; Anxiety; Arrhythmia; Arthritis; Cystitis; Depression; GERD (gastroesophageal reflux disease); Gross hematuria; Heart murmur; HLD (hyperlipidemia); Hypertension; Hypothyroid; and Tobacco abuse.  has a past surgical history that includes Foot surgery (Right, 2013); Carpal tunnel release (Bilateral, 2011); and Tonsillectomy (1979). Prior to Admission medications   Medication Sig  Start Date End Date Taking? Authorizing Provider  albuterol (PROVENTIL HFA;VENTOLIN HFA) 108 (90 Base) MCG/ACT inhaler Inhale 2 puffs into the lungs every 6 (six) hours as needed for wheezing or shortness of breath. 11/25/16   Lisa Roca, MD  atenolol (TENORMIN) 50 MG tablet Take 1 tablet (50 mg total) by mouth daily. 11/19/16   Karamalegos, Devonne Doughty, DO  Cetirizine HCl (ZYRTEC ALLERGY) 10 MG CAPS     [provider]  EPINEPHrine (EPIPEN 2-PAK) 0.3 mg/0.3 mL IJ SOAJ injection Inject 0.3 mLs (0.3 mg total) into the muscle once. 10/07/15   Nance Pear, MD  fluticasone (FLONASE) 50 MCG/ACT nasal spray Place 2 sprays into both nostrils daily.    [provider]  fluvoxaMINE (LUVOX) 100 MG tablet Take 150 mg by mouth 2 (two) times daily.    [provider]  hydrocortisone 2.5 % cream Apply topically. 02/20/17 02/20/18  [provider]  ketoconazole (NIZORAL) 2 % cream Apply 1 application topically daily as needed for irritation.    [provider]  ketoconazole (NIZORAL) 2 % shampoo Apply 1 application topically 2 (two) times a week.    [provider]  lamoTRIgine (LAMICTAL) 200 MG tablet Take 200 mg by mouth daily.    [provider]  levothyroxine (SYNTHROID, LEVOTHROID) 50 MCG tablet take 1 tablet by mouth every morning 02/25/17   Parks Ranger, Devonne Doughty, DO  medroxyPROGESTERone (DEPO-PROVERA) 150 MG/ML injection Inject 1 mL (150 mg total) into the muscle every 3 (three) months. 05/09/17   Defrancesco, Alanda Slim, MD  pantoprazole (PROTONIX) 20 MG tablet Take 20 mg by mouth daily.    [provider]  QUEtiapine (SEROQUEL)  50 MG tablet Take 50 mg by mouth 3 (three) times daily.    [provider]  triamcinolone cream (KENALOG) 0.1 % Apply topically. 02/20/17 02/20/18  [provider]   Allergies  Allergen Reactions  . Bee Venom Anaphylaxis, Hives and Swelling    Carries Epi pen.  . Cat Hair Extract Itching,  Other (See Comments) and Swelling    Allergic to trees, nuts, wheat, grass, cats & dogs - itchy watery eyes, swelling. Uses Zyrtec & Flonase & Benadryl if really bad. Used to get allergy shots.  . Tetracyclines & Related Nausea And Vomiting  . Lac Bovis Diarrhea and Nausea And Vomiting  . Lactose Diarrhea and Nausea And Vomiting  . Tape Other (See Comments) and Rash    Needs to use paper tape. Breaks out with severe rash, pulls skin off when using adhesive.    FAMILY HISTORY:  family history includes Alcohol abuse in her mother; Cancer in her maternal grandmother; Depression in her father, mother, and paternal grandfather; Diabetes in her father and maternal grandmother; Drug abuse in her father and paternal grandfather; Kidney disease in her maternal grandfather; Mental illness in her father and mother. SOCIAL HISTORY:  reports that she has been smoking Cigarettes.  She has a 15.00 pack-year smoking history. She uses smokeless tobacco. She reports that she does not drink alcohol or use drugs.  REVIEW OF SYSTEMS:   Constitutional: Negative for fever, chills, weight loss, +malaise/fatigue and diaphoresis.  HENT: Negative for hearing loss, ear pain, nosebleeds, congestion, sore throat, neck pain, tinnitus and ear discharge.   Eyes: Negative for blurred vision, double vision, photophobia, pain, discharge and redness.  Respiratory: Negative for cough, hemoptysis, sputum production,+ shortness of breath, +wheezing and stridor.   Cardiovascular: Negative for chest pain, palpitations, orthopnea, claudication, leg swelling and PND.  Gastrointestinal: Negative for heartburn, nausea, vomiting, abdominal pain, diarrhea, constipation, blood in stool and melena.  Genitourinary: Negative for dysuria, urgency, frequency, hematuria and flank pain.  Musculoskeletal: Negative for myalgias, back pain, joint pain and falls.  Skin: Negative for itching and rash.  Neurological: Negative for dizziness, tingling,  tremors, sensory change, speech change, focal weakness, seizures, loss of consciousness, weakness and headaches.  Endo/Heme/Allergies: Negative for environmental allergies and polydipsia. Does not bruise/bleed easily.  ALL OTHER ROS ARE NEGATIVE   BP 134/70 (BP Location: Left Arm, Cuff Size: Normal)   Pulse 66   Resp 16   Ht 5\' 7"  (1.702 m)   Wt 253 lb (114.8 kg)   SpO2 98%   BMI 39.63 kg/m    Physical Examination:   GENERAL:NAD, no fevers, chills, no weakness no fatigue HEAD: Normocephalic, atraumatic.  EYES: Pupils equal, round, reactive to light. Extraocular muscles intact. No scleral icterus.  MOUTH: Moist mucosal membrane.   EAR, NOSE, THROAT: Clear without exudates. No external lesions.  NECK: Supple. No thyromegaly. No nodules. No JVD.  PULMONARY:CTA B/L no wheezes, no crackles, no rhonchi CARDIOVASCULAR: S1 and S2. Regular rate and rhythm. No murmurs, rubs, or gallops. No edema.  GASTROINTESTINAL: Soft, nontender, nondistended. No masses. Positive bowel sounds.  MUSCULOSKELETAL: No swelling, clubbing, or edema. Range of motion full in all extremities.  NEUROLOGIC: Cranial nerves II through XII are intact. No gross focal neurological deficits.  SKIN: No ulceration, lesions, rashes, or cyanosis. Skin warm and dry. Turgor intact.  PSYCHIATRIC: Mood, affect within normal limits. The patient is awake, alert and oriented x 3. Insight, judgment intact.       ASSESSMENT / PLAN: 46 year old morbidly  obese white female seen today for intermittent reactive airway disease in the setting of extensive smoking history and secondhand smoke exposure with recurrent bouts of pneumonia and bronchitis which is strongly and heavily related to her strong smoking status and  History  At this time her spirometry shows a normal FEV1 to FVC ratio with an FEV1 of 2.5 L at 79% predicted with FVC of 3.09 L and 77% predicted with the interpretation of mild restrictive lung disease most likely related  to her morbid obesity  #1 recurrent bouts of bronchitis and pneumonia related to her strong smoking history Smoking cessation strongly advised quit date has been set for September 20  #2 intermittent reactive airways disease from smoke exposure I have asked pain to her that this is a propensity to turn into severe obstructive lung disease I have advised to start Spiriva Respimat 1.25 Albuterol as needed  #3 Obesity -recommend significant weight loss -recommend changing diet  4 Deconditioned state -Recommend increased daily activity and exercise     Patient satisfied with Plan of action and management. All questions answered Follow-up in 3 months to assist with smoking cessation   Corrin Parker, M.D.  [facility]  Medical Director [place] Director [facility] Cardio-Pulmonary Department

## 2017-07-16 NOTE — Patient Instructions (Signed)
STOP SMOKING Start prednisone 40 mg daily for 10 days

## 2017-07-16 NOTE — Telephone Encounter (Signed)
Would leave it up to her if she would like to wear another monitor Can they send [place] another one? We did not find much on the 3 days that she wore the monitor

## 2017-07-16 NOTE — Telephone Encounter (Signed)
Pt dropped by to discuss monitor She wore it for 3 days and then it fell off  Wonders what to do next Please advise

## 2017-07-16 NOTE — Telephone Encounter (Signed)
Pt also sent MyChart message - ok to respond to that.

## 2017-07-17 ENCOUNTER — Telehealth: Payer: Self-pay | Admitting: *Deleted

## 2017-07-17 NOTE — Telephone Encounter (Signed)
Spoke with patient and reviewed Dr. Donivan Scull recommendations to possibly wear another monitor. She was agreeable with this and would like to wear another one. Order entered and will have scheduling call her to set up appointment for placement. She verbalized understanding with no further questions at this time.

## 2017-07-17 NOTE — Telephone Encounter (Signed)
Patient's insurance will not cover Spiriva Respimat:  Alternatives are: Advair Breo Incruse Anoro  Please advise of change.

## 2017-07-18 MED ORDER — UMECLIDINIUM BROMIDE 62.5 MCG/INH IN AEPB: 1.0000 | INHALATION_SPRAY | Freq: Every day | RESPIRATORY_TRACT | 5 refills | Status: DC

## 2017-07-18 NOTE — Telephone Encounter (Signed)
incruse please 

## 2017-07-18 NOTE — Telephone Encounter (Signed)
Inhaler changed. Left detailed message and contact info.

## 2017-07-19 ENCOUNTER — Ambulatory Visit (INDEPENDENT_AMBULATORY_CARE_PROVIDER_SITE_OTHER): Payer: Medicare Other

## 2017-07-19 ENCOUNTER — Other Ambulatory Visit: Payer: Self-pay | Admitting: Family Medicine

## 2017-07-19 DIAGNOSIS — R002 Palpitations: Secondary | ICD-10-CM

## 2017-08-08 DIAGNOSIS — R002 Palpitations: Secondary | ICD-10-CM | POA: Diagnosis not present

## 2017-08-12 ENCOUNTER — Ambulatory Visit (INDEPENDENT_AMBULATORY_CARE_PROVIDER_SITE_OTHER): Payer: Medicare Other | Admitting: Obstetrics and Gynecology

## 2017-08-12 VITALS — BP 112/77 | HR 73 | Wt 258.1 lb

## 2017-08-12 DIAGNOSIS — N939 Abnormal uterine and vaginal bleeding, unspecified: Secondary | ICD-10-CM

## 2017-08-12 MED ORDER — MEDROXYPROGESTERONE ACETATE 150 MG/ML IM SUSP
150.0000 mg | Freq: Once | INTRAMUSCULAR | Status: AC
  Administered 2017-08-12: 150 mg via INTRAMUSCULAR

## 2017-08-12 NOTE — Progress Notes (Signed)
Patient ID: Amanda Webb, female   DOB: [birth year], 46 y.o.   MRN: [id number] Pt presents for her Depo-Provera (Medroxyprogesterone) injection. No c/o side effects. Pt no longer has any vaginal bleeding. Last annual visit: 04/02/2017 Last Injection given: 05/27/2017 Depo-Provera 150 mg IM given today: LUOQ buttocks by DR, LPN  Next injection due: 10/28/2017 thru 11/11/2017  BP 112/77   Pulse 73   Wt 258 lb 1.6 oz (117.1 kg)   BMI 40.42 kg/m    I have reviewed the record and concur with patient management and plan. DEFRANCESCO, Hassell Done, MD, Cherlynn June

## 2017-08-18 ENCOUNTER — Other Ambulatory Visit: Payer: Self-pay | Admitting: Family Medicine

## 2017-08-22 ENCOUNTER — Encounter: Payer: Self-pay | Admitting: Cardiovascular Disease

## 2017-09-17 ENCOUNTER — Ambulatory Visit (INDEPENDENT_AMBULATORY_CARE_PROVIDER_SITE_OTHER): Payer: Medicare Other | Admitting: Nurse Practitioner

## 2017-09-17 ENCOUNTER — Encounter: Payer: Self-pay | Admitting: Nurse Practitioner

## 2017-09-17 VITALS — BP 119/62 | HR 78 | Temp 99.5°F | Ht 67.0 in | Wt 260.0 lb

## 2017-09-17 DIAGNOSIS — J0140 Acute pansinusitis, unspecified: Secondary | ICD-10-CM | POA: Diagnosis not present

## 2017-09-17 MED ORDER — AMOXICILLIN-POT CLAVULANATE 875-125 MG PO TABS: 1.0000 | ORAL_TABLET | Freq: Two times a day (BID) | ORAL | 0 refills | Status: AC

## 2017-09-17 NOTE — Progress Notes (Signed)
Subjective:    Patient ID: Amanda Webb, female    DOB: 04-12-1971, 46 y.o.   MRN: [id number]  Amanda Webb is a 46 y.o. female presenting on 09/17/2017 for Ear Pain (x 2 weeks, cough that started lastnight.)   HPI Ear Pain/ URI Pt has had following symptoms x 2 weeks: - aching/throbbing/stabbing pain in ear.   - sinus pressure, headaches, fatigue, malaise. - Cough started last night w/ chest congestion as well. - Is having sweats, chills > 1 week.  Denies nausea vomiting, constipation, diarrhea, sore throat, change in hearing/muffled sounds.  Is getting adequate fluid intake. Is taking zyrtec and Flonase with control of seasonal allergies.  Social History  Substance Use Topics  . Smoking status: Current Every Day Smoker    Packs/day: 0.50    Years: 30.00    Types: Cigarettes  . Smokeless tobacco: Current User  . Alcohol use No    Review of Systems Per HPI unless specifically indicated above     Objective:    BP 119/62 (BP Location: Right Arm, Patient Position: Sitting, Cuff Size: Large)   Pulse 78   Temp 99.5 F (37.5 C) (Oral)   Ht 5\' 7"  (1.702 m)   Wt 260 lb (117.9 kg)   SpO2 99%   BMI 40.72 kg/m   Wt Readings from Last 3 Encounters:  09/17/17 260 lb (117.9 kg)  08/12/17 258 lb 1.6 oz (117.1 kg)  07/16/17 253 lb (114.8 kg)    Physical Exam  Constitutional: She is oriented to person, place, and time. She appears well-developed and well-nourished. She appears ill.  HENT:  Head: Normocephalic and atraumatic.  Right Ear: Hearing, external ear and ear canal normal. Tympanic membrane is bulging.  Left Ear: Hearing, external ear and ear canal normal. Tympanic membrane is bulging.  Nose: Mucosal edema and rhinorrhea present. Right sinus exhibits maxillary sinus tenderness. Right sinus exhibits no frontal sinus tenderness. Left sinus exhibits maxillary sinus tenderness. Left sinus exhibits no frontal sinus tenderness.  Mouth/Throat: Uvula is midline,  oropharynx is clear and moist and mucous membranes are normal.  Neck: Normal range of motion. Neck supple.  Pulmonary/Chest: Effort normal and breath sounds normal. No respiratory distress. She has no wheezes.  Negative egophony  Lymphadenopathy:    She has cervical adenopathy.       Right cervical: Superficial cervical adenopathy present.       Left cervical: Superficial cervical adenopathy present.  Neurological: She is alert and oriented to person, place, and time.  Skin: Skin is warm and dry. She is not diaphoretic.  Psychiatric: She has a normal mood and affect. Her behavior is normal. Judgment and thought content normal.   Results for orders placed or performed in visit on 17/49/44  Follicle stimulating hormone  Result Value Ref Range   FSH 7.4 mIU/mL  Pap IG and HPV (high risk) DNA detection  Result Value Ref Range   DIAGNOSIS: Comment    Specimen adequacy: Comment    Clinician Provided ICD10 Comment    Performed by: Comment    PAP Smear Comment .    Note: Comment    Test Methodology Comment    HPV, high-risk Negative Negative      Assessment & Plan:   Problem List Items Addressed This Visit    None    Visit Diagnoses    Acute non-recurrent pansinusitis    -  Primary Acute illness. Fever responsive to NSAIDs and tylenol.  Symptoms worsening. Consistent with bacterial illness  x 14 days with no known sick contacts and no other identifiable focal infections of ears, nose, throat.  Plan: 1. Start augmentin 875-125 bid x 10 days. - Continue anti-histamine Cetirizine 10mg  daily,  - Pt declined flonase. - Start Mucinex-DM OTC up to 7-10 days then stop 2. Supportive care with nasal saline, warm herbal tea with honey, 3. Improve hydration 4. Tylenol / Motrin PRN fevers 5. Return criteria given   Relevant Medications   amoxicillin-clavulanate (AUGMENTIN) 875-125 MG tablet      Meds ordered this encounter  Medications  . amoxicillin-clavulanate (AUGMENTIN) 875-125 MG  tablet    Sig: Take 1 tablet by mouth 2 (two) times daily.    Dispense:  20 tablet    Refill:  0    Order Specific Question:   Supervising Provider    Answer:   Olin Hauser [[id number]]      Follow up plan: Return 3-5 days if symptoms worsen or fail to improve.  Cassell Smiles, DNP, AGPCNP-BC Adult Gerontology Primary Care Nurse Practitioner [facility] 09/24/2017, 5:36 PM

## 2017-09-17 NOTE — Patient Instructions (Addendum)
Amanda Webb, Thank you for coming in to clinic today.  1. It sounds like you have an Upper Respiratory Bacterial infection, possibly sinusitis.  Recommend good hand washing. - START taking augmentin 875 mg twice daily (about every 12 hours) for 10 days.  Make sure to take all doses of your antibiotic. - While you are on an antibiotic, take a probiotic.  Antibiotics kill good and bad bacteria.  A probiotic helps to replace your good bacteria. Probiotic pills can be found over the counter.  One brand is Florastor, but you can use any brand you prefer.  You can also get good bacteria from foods like yogurt. - Drink plenty of fluids.  - Start Continue anti-histamine cetirizine 10mg  daily. - You may also continue use Flonase 2 sprays each nostril daily for up to 4-6 weeks  Other over the counter medications you may try, if needed for symptoms are: - If congestion is worse, start OTC Mucinex (or may try Mucinex-DM for cough) up to 7-10 days then stop - You may try over the counter Nasal Saline spray (Simply Saline, Ocean Spray) as needed to reduce congestion. - Start taking Tylenol extra strength 1 to 2 tablets every 6-8 hours for aches or fever/chills for next few days as needed.  Do not take more than 3,000 mg in 24 hours from all medicines.  You may also take ibuprofen 200-400mg  every 8 hours as needed.   - Drink warm herbal tea with honey for sore throat.    If symptoms are significantly worse with persistent fevers/chills despite tylenol/ibpurofen, nausea, vomiting unable to tolerate food/fluids or medicine, body aches, or shortness of breath, sinus pain pressure or worsening productive cough, then follow-up for re-evaluation, may seek more immediate care at Urgent Care or the ED if you are more concerned that it is an emergency.  2. Call your pulmonologist to ask about your lactose allergy with your inhaler.  Please schedule a follow-up appointment with Cassell Smiles, AGNP. Return 3-5 days if symptoms  worsen or fail to improve.  If you have any other questions or concerns, please feel free to call the clinic or send a message through [place]. You may also schedule an earlier appointment if necessary.  You will receive a survey after today's visit either digitally by e-mail or paper by [employer]. Your experiences and feedback matter to [place].  Please respond so we know how we are doing as we provide care for you.   Cassell Smiles, DNP, AGNP-BC Adult Gerontology Nurse Practitioner [place]

## 2017-09-18 DIAGNOSIS — F4310 Post-traumatic stress disorder, unspecified: Secondary | ICD-10-CM | POA: Diagnosis not present

## 2017-09-23 DIAGNOSIS — Z1231 Encounter for screening mammogram for malignant neoplasm of breast: Secondary | ICD-10-CM | POA: Diagnosis not present

## 2017-09-23 LAB — HM MAMMOGRAPHY: HM Mammogram: NORMAL (ref 0–4)

## 2017-09-25 ENCOUNTER — Encounter: Payer: Self-pay | Admitting: Internal Medicine

## 2017-09-30 DIAGNOSIS — D229 Melanocytic nevi, unspecified: Secondary | ICD-10-CM | POA: Diagnosis not present

## 2017-09-30 DIAGNOSIS — L738 Other specified follicular disorders: Secondary | ICD-10-CM | POA: Diagnosis not present

## 2017-10-01 ENCOUNTER — Other Ambulatory Visit: Payer: Self-pay | Admitting: Family Medicine

## 2017-10-01 ENCOUNTER — Other Ambulatory Visit: Payer: Self-pay | Admitting: Internal Medicine

## 2017-10-01 DIAGNOSIS — K219 Gastro-esophageal reflux disease without esophagitis: Secondary | ICD-10-CM

## 2017-10-01 DIAGNOSIS — J309 Allergic rhinitis, unspecified: Secondary | ICD-10-CM

## 2017-10-03 ENCOUNTER — Ambulatory Visit (INDEPENDENT_AMBULATORY_CARE_PROVIDER_SITE_OTHER): Payer: Medicare Other | Admitting: Nurse Practitioner

## 2017-10-03 ENCOUNTER — Ambulatory Visit: Payer: Medicare Other | Admitting: Family Medicine

## 2017-10-03 ENCOUNTER — Encounter: Payer: Self-pay | Admitting: Nurse Practitioner

## 2017-10-03 VITALS — BP 142/57 | HR 68 | Temp 98.9°F | Resp 16 | Ht 67.0 in | Wt 264.0 lb

## 2017-10-03 DIAGNOSIS — Z720 Tobacco use: Secondary | ICD-10-CM

## 2017-10-03 DIAGNOSIS — Z23 Encounter for immunization: Secondary | ICD-10-CM | POA: Diagnosis not present

## 2017-10-03 DIAGNOSIS — Z716 Tobacco abuse counseling: Secondary | ICD-10-CM | POA: Diagnosis not present

## 2017-10-03 DIAGNOSIS — J410 Simple chronic bronchitis: Secondary | ICD-10-CM

## 2017-10-03 DIAGNOSIS — R600 Localized edema: Secondary | ICD-10-CM

## 2017-10-03 MED ORDER — IPRATROPIUM BROMIDE HFA 17 MCG/ACT IN AERS: 2.0000 | INHALATION_SPRAY | Freq: Four times a day (QID) | RESPIRATORY_TRACT | 12 refills | Status: DC | PRN

## 2017-10-03 NOTE — Progress Notes (Signed)
Subjective:    Patient ID: Amanda Webb, female    DOB: 07/26/1971, 46 y.o.   MRN: [id number]  Amanda Webb is a 46 y.o. female presenting on 10/03/2017 for Nasal Congestion   HPI Followup cough and nasal congestion Would like flu shot, has had sinusitis w/ antibiotic treatment on 09/17/17 w/ augmentin bid x 10 days.  Has done well and is feeling better.  Still having prolonged cough and wants to be checked.  Persistent off-white cough.  Occasionally "tastes funny."  Pt has seen pulmonology was prescribed two different inhalers for long-acting coverage.  One d/c r/t cost.  Incruse has not yet been started because pt cites she has a milk allergy and read side effect panel of this medication which contains milk.  Pt only has lactose intolerance and no severe anaphylaxis.  Encouraged pt at last visit to try calling pulmonology to ask about her allergy.  She received a response from office staff and chooses not to believe them that she can take this inhaler because they aren't her doctor.  Smoking cessation Has moved back w/ her mother who smokes 2 ppd.  Pt desires to quit, but is not able to quit in current location.  Cites disgust w/ smelling stale smoke in her home.  Pt is smoking more in new living environment because she isn't smoking outside.  Edema Also having bilateral hand edema w/ pitting.  Reports weight gain of 4 lbs in last 2 weeks.  Pt has no other symptoms and has not noticed this symptom until this week.  Pt reports is mostly sedentary.  Denies lower extremity edema.  Social History  Substance Use Topics  . Smoking status: Current Every Day Smoker    Packs/day: 0.50    Years: 30.00    Types: Cigarettes  . Smokeless tobacco: Current User  . Alcohol use No    Review of Systems Per HPI unless specifically indicated above     Objective:    BP (!) 142/57 (BP Location: Right Arm, Patient Position: Sitting, Cuff Size: Normal)   Pulse 68   Temp 98.9 F (37.2 C)  (Oral)   Resp 16   Ht 5\' 7"  (1.702 m)   Wt 264 lb (119.7 kg)   SpO2 99%   BMI 41.35 kg/m   BP recheck 128/74  Wt Readings from Last 3 Encounters:  10/03/17 264 lb (119.7 kg)  09/17/17 260 lb (117.9 kg)  08/12/17 258 lb 1.6 oz (117.1 kg)    Physical Exam General - morbidly obese, well-appearing, NAD HEENT - Normocephalic, atraumatic, PERRL, EOMI, patent nares w/o congestion, oropharynx clear, MMM, TM, Ear canal, external ear normal w/o lesions Neck - supple, non-tender, no LAD, no carotid bruit Heart - RRR, no murmurs heard Lungs - Wheezing at end inhalation throughout all lobes, no crackles, or rhonchi. Normal work of breathing. Extremeties - non-tender, +1 pitting upper extremity edema, no pedal edema, cap refill < 2 seconds, peripheral pulses intact +2 bilaterally Skin - warm, dry Neuro - awake, alert, oriented x3, normal gait Psych - Anxious mood and affect, normal behavior   Results for orders placed or performed in visit on 56/38/75  Follicle stimulating hormone  Result Value Ref Range   FSH 7.4 mIU/mL  Pap IG and HPV (high risk) DNA detection  Result Value Ref Range   DIAGNOSIS: Comment    Specimen adequacy: Comment    Clinician Provided ICD10 Comment    Performed by: Comment    PAP Smear Comment .  Note: Comment    Test Methodology Comment    HPV, high-risk Negative Negative      Assessment & Plan:   Problem List Items Addressed This Visit      Respiratory   Simple chronic bronchitis ([place])    Chronic problem likely result from long smoking history.  Previously evaluated by PCP Dr. Parks Ranger and Pulmonology, but has refused to start her long-acting inhaler.  Currently symptoms are controlled except cough. - Mild wheezing noted today, but improved over last visit prior to treatment of URI.  URI resolved w/ persistent cough.  Plan: 1. Continue use Albuterol inhaler 2 puffs q 4-6 hours as needed 2. START atrovent inhaler 2 puffs up to every 6 hours as  needed for cough and wheezing.  Prefer long-acting, but pt has refused to start.  Defer allergy discussion to next PCP or pulmonology visit, since did not try inhaler after my last encounter with her. 3. Continue Loratadine (Claritin) 10mg  daily and Flonase 2 sprays in each nostril daily for next 4-6 weeks, then may stop and use seasonally or as needed 4. Encouraged smoking cessation 5. Follow up as previously scheduled w/ Pulmonology.      Relevant Medications   ipratropium (ATROVENT HFA) 17 MCG/ACT inhaler     Other   Tobacco abuse    Active smoker, improved readiness to quit.  Not yet ready to develop cessation plan for medications but is willing to cut back. Likely has underlying COPD w/ bronchitis.    Plan: 1. See AP chronic bronchitis above. 2. Discussed starting to smoke all cigarettes outside again to reduce her smoking.  Cut back daily smoked cigarettes each week as able.  Also discussed techniques to manage cravings  Discussion today >5 minutes (<10 minutes) specifically on counseling on risks of tobacco use, complications, treatment, smoking cessation.         Relevant Orders   Pneumococcal polysaccharide vaccine 23-valent greater than or equal to 2yo subcutaneous/IM (Completed)    Other Visit Diagnoses    Need for immunization against influenza    -  Primary   Pt desires vaccination and is age < 53.  Administer quadrivalent flu vaccine today.   Relevant Orders   Flu Vaccine QUAD 6+ mos PF IM (Fluarix Quad PF) (Completed)   Need for pneumococcal vaccine       Pt desires vaccination.  Indicated for active smoker w/ chronic cough.  Administer pneumonia vaccine PPSV23 today.   Relevant Orders   Pneumococcal polysaccharide vaccine 23-valent greater than or equal to 2yo subcutaneous/IM (Completed)   Edema of upper extremity       Unknown direct cause. Acute symptoms over last several days.  Likely transient and may be related to sedentary lifestyle and obesity.  Only trace  lower extremity edema noted.    Plan: Encouraged pt to increase activity, monitor sodium intake.  Also monitor for worsening edema.  If persists longer than another 2 weeks, will need to be evaluated again by PCP.        Meds ordered this encounter  Medications  . ipratropium (ATROVENT HFA) 17 MCG/ACT inhaler    Sig: Inhale 2 puffs into the lungs every 6 (six) hours as needed for wheezing.    Dispense:  1 Inhaler    Refill:  12    Order Specific Question:   Supervising Provider    Answer:   Olin Hauser [[id number]]      Follow up plan: Return if symptoms worsen or fail to  improve, for cough AND in January 2019 for annual physical w/ Dr. Pascal Lux, DNP, AGPCNP-BC Adult Gerontology Primary Care Nurse Practitioner [facility] 10/10/2017, 10:42 AM

## 2017-10-03 NOTE — Patient Instructions (Addendum)
[name], Thank you for coming in to clinic today.  1. For your inhalers: - Use Atrovent inhaler 2 puffs twice per day and up to every 6 hours as needed for wheezing.  This is not a long acting inhaler, but you have not wanted to start Incruse which is your other covered option for insurance. - Use your albuterol for wheezing not relieved by Atrovent.  2. Work to stop smoking: - Start smoking your cigarettes outside since you were successful with this method in the past.  3. Your cough is likely from your smoking. After being sick, your cough can be slightly worse than before being sick.  Please schedule a follow-up appointment with Cassell Smiles, AGNP. Return if symptoms worsen or fail to improve for cough AND in January 2019 for annual physical w/ Dr. Raliegh Ip.  If you have any other questions or concerns, please feel free to call the clinic or send a message through [place]. You may also schedule an earlier appointment if necessary.  You will receive a survey after today's visit either digitally by e-mail or paper by [employer]. Your experiences and feedback matter to [place].  Please respond so we know how we are doing as we provide care for you.   Cassell Smiles, DNP, AGNP-BC Adult Gerontology Nurse Practitioner [place]

## 2017-10-04 ENCOUNTER — Ambulatory Visit: Payer: Medicare Other | Admitting: Nurse Practitioner

## 2017-10-04 ENCOUNTER — Encounter: Payer: Self-pay | Admitting: Nurse Practitioner

## 2017-10-04 ENCOUNTER — Ambulatory Visit: Payer: Medicare Other | Admitting: Family Medicine

## 2017-10-10 NOTE — Assessment & Plan Note (Addendum)
Chronic problem likely result from long smoking history.  Previously evaluated by PCP Dr. Parks Ranger and Pulmonology, but has refused to start her long-acting inhaler.  Currently symptoms are controlled except cough. - Mild wheezing noted today, but improved over last visit prior to treatment of URI.  URI resolved w/ persistent cough.  Plan: 1. Continue use Albuterol inhaler 2 puffs q 4-6 hours as needed 2. START atrovent inhaler 2 puffs up to every 6 hours as needed for cough and wheezing.  Prefer long-acting, but pt has refused to start.  Defer allergy discussion to next PCP or pulmonology visit, since did not try inhaler after my last encounter with her. 3. Continue Loratadine (Claritin) 10mg  daily and Flonase 2 sprays in each nostril daily for next 4-6 weeks, then may stop and use seasonally or as needed 4. Encouraged smoking cessation 5. Follow up as previously scheduled w/ Pulmonology.

## 2017-10-10 NOTE — Assessment & Plan Note (Signed)
Active smoker, improved readiness to quit.  Not yet ready to develop cessation plan for medications but is willing to cut back. Likely has underlying COPD w/ bronchitis.    Plan: 1. See AP chronic bronchitis above. 2. Discussed starting to smoke all cigarettes outside again to reduce her smoking.  Cut back daily smoked cigarettes each week as able.  Also discussed techniques to manage cravings  Discussion today >5 minutes (<10 minutes) specifically on counseling on risks of tobacco use, complications, treatment, smoking cessation.

## 2017-10-25 ENCOUNTER — Other Ambulatory Visit: Payer: Self-pay | Admitting: Obstetrics and Gynecology

## 2017-10-27 ENCOUNTER — Encounter: Payer: Self-pay | Admitting: Obstetrics and Gynecology

## 2017-10-28 ENCOUNTER — Other Ambulatory Visit: Payer: Self-pay

## 2017-10-28 ENCOUNTER — Ambulatory Visit (INDEPENDENT_AMBULATORY_CARE_PROVIDER_SITE_OTHER): Payer: Medicare Other | Admitting: Family Medicine

## 2017-10-28 ENCOUNTER — Encounter: Payer: Self-pay | Admitting: Family Medicine

## 2017-10-28 ENCOUNTER — Ambulatory Visit (INDEPENDENT_AMBULATORY_CARE_PROVIDER_SITE_OTHER): Payer: Medicare Other | Admitting: Obstetrics and Gynecology

## 2017-10-28 VITALS — BP 139/78 | HR 67 | Wt 267.1 lb

## 2017-10-28 VITALS — BP 127/71 | HR 67 | Temp 99.7°F | Resp 16 | Ht 67.0 in | Wt 267.0 lb

## 2017-10-28 DIAGNOSIS — M545 Low back pain, unspecified: Secondary | ICD-10-CM

## 2017-10-28 DIAGNOSIS — M47816 Spondylosis without myelopathy or radiculopathy, lumbar region: Secondary | ICD-10-CM | POA: Diagnosis not present

## 2017-10-28 DIAGNOSIS — N939 Abnormal uterine and vaginal bleeding, unspecified: Secondary | ICD-10-CM | POA: Diagnosis not present

## 2017-10-28 LAB — POCT URINALYSIS DIPSTICK
Bilirubin, UA: NEGATIVE
Blood, UA: NEGATIVE
Glucose, UA: NEGATIVE
Ketones, UA: NEGATIVE
Leukocytes, UA: NEGATIVE
Nitrite, UA: NEGATIVE
Protein, UA: NEGATIVE
Spec Grav, UA: 1.02 (ref 1.010–1.025)
Urobilinogen, UA: 0.2 E.U./dL
pH, UA: 5 (ref 5.0–8.0)

## 2017-10-28 MED ORDER — MELOXICAM 15 MG PO TABS: 15.0000 mg | ORAL_TABLET | Freq: Every day | ORAL | 0 refills | Status: DC

## 2017-10-28 MED ORDER — BACLOFEN 10 MG PO TABS: 5.0000 mg | ORAL_TABLET | Freq: Three times a day (TID) | ORAL | 1 refills | Status: DC | PRN

## 2017-10-28 MED ORDER — MEDROXYPROGESTERONE ACETATE 150 MG/ML IM SUSP
150.0000 mg | Freq: Once | INTRAMUSCULAR | Status: AC
  Administered 2017-10-28: 150 mg via INTRAMUSCULAR

## 2017-10-28 NOTE — Telephone Encounter (Signed)
Pharmacy called patient and told her she is 16 days too early for depo inj today   Please call patient

## 2017-10-28 NOTE — Telephone Encounter (Signed)
Patient calling again

## 2017-10-28 NOTE — Telephone Encounter (Signed)
Pt had called to be sure rx was ordered. Actually by dates, it is not too early to get refilled. Refill of medroxyprogesterone 150mg  was sent to pharmacy with 1 refill.

## 2017-10-28 NOTE — Patient Instructions (Addendum)
Thank you for coming to the clinic today.  1. For your Back Pain - I think that this is due to Muscle Spasms or strain.   2. Start with anti-inflammatory Meloxicam 15mg  ONCE daily with food, breakfast  every day for next 2 to 4 weeks if helping, then can use only as needed STOP Ibuprofen  3. Start Baclofen (Lioresal) 10mg  tablets - cut in half for 5mg  at night for muscle relaxant - may make you sedated or sleepy (be careful driving or working on this) if tolerated you can take every 8 hours, half or whole tab  4. May use Tylenol Extra Str 500mg  tabs - may take 1-2 tablets every 6 hours as needed  5. Recommend to start using heating pad on your lower back 1-2x daily for few weeks  This pain may take weeks to months to fully resolve, but hopefully it will respond to the medicine initially. All back injuries (small or serious) are slow to heal since we use our back muscles every day. Be careful with turning, twisting, lifting, sitting / standing for prolonged periods, and avoid re-injury.  If your symptoms significantly worsen with more pain, or new symptoms with weakness in one or both legs, new or different shooting leg pains, numbness in legs or groin, loss of control or retention of urine or bowel movements, please call back for advice and you may need to go directly to the Emergency Department.  Please schedule a Follow-up Appointment to: Return in about 4 weeks (around 11/25/2017) for Low back Pain.  If you have any other questions or concerns, please feel free to call the clinic or send a message through [place]. You may also schedule an earlier appointment if necessary.  Additionally, you may be receiving a survey about your experience at our clinic within a few days to 1 week by e-mail or mail. We value your feedback.  [name], DO [place]

## 2017-10-28 NOTE — Progress Notes (Signed)
Patient ID: Amanda Webb, female   DOB: 07-24-1971, 46 y.o.   MRN: [id number]   Pt presents for her Depo-Provera (Medroxyprogesterone) injection. No c/o side effects. No c/o vaginal bleeding. Last annual visit: 04/02/2017 Last Injection given: 08/12/2017 Depo-Provera 150 mg IM given today: RUOQ buttocks, by Willodean Rosenthal.,LPN Next injection due: 1/28 thru 01/27/2018.  BP 139/78   Pulse 67   Wt 267 lb 1.6 oz (121.2 kg)   BMI 41.83 kg/m    I have reviewed the record and concur with patient management and plan. DEFRANCESCO, Hassell Done, MD, [facility]lynn June

## 2017-10-28 NOTE — Progress Notes (Signed)
Subjective:    Patient ID: Amanda Webb, female    DOB: [birth year], 46 y.o.   MRN: [id number]  Marlyss Cissell is a 46 y.o. female presenting on 10/28/2017 for Back Pain (muscle spasm onset 6 days numbness in thigh)  Patient presents for a same day appointment.  HPI  MID LOW BACK PAIN, Right side - Reports prior history of chronic back pain for 20 years, including prior history of herniated disc among other issues, that have long since resolved, in past she was followed by [facility] last >4 years ago. Otherwise has been pain free for while. - Today now has new flare of back pain. Reports symptoms started about 6 days ago without known inciting injury, seems to be persistent without improvement or worsening. Describes pain as tight muscle spasm aching pain, moderate severity with intermittent worsening due to position change reaching twisting. No pain radiating to legs. - Tried Flexeril 10mg  from mother, did not tolerate well and made her feel "funny" - Tried Tylenol 1000mg  q 6-8 hours without relief, taking Ibuprofen 400mg  every 4 hours without relief - History of lumbar OA/DJD - Admits difficulty sleeping due to pain - Denies any fevers/chills, numbness, tingling, weakness, loss of control bladder/bowel incontinence or retention, unintentional wt loss, night sweats, dysuria, hematuria, fevers  Additional concerns today: - Right ear pressure still, has been seen back in 09/2017 with sinus infection, overall improved still has some lingering R ear symptoms, not painful. Afebrile - Abnormal weight gain / Morbid Obesity: Still weight gain in past 1-2 months 3-4 lbs, difficulty losing weight but today admits poor diet (says "eats junk food"), no regular exercise. She does not know what to do about it, and states she cannot exercise because she gets tired easily when attempted to be active, she is still taking Depo-Provera for contraception. Lowest wt was 220 lbs years ago, has  been worse prior to pregnancy  Health Maintenance: - UTD Flu Shot 10/03/17  Depression screen PHQ 2/9 09/26/2016  Decreased Interest 0  Down, Depressed, Hopeless 0  PHQ - 2 Score 0    Social History   Tobacco Use  . Smoking status: Current Every Day Smoker    Packs/day: 0.50    Years: 30.00    Pack years: 15.00    Types: Cigarettes  . Smokeless tobacco: Current User  Substance Use Topics  . Alcohol use: No  . Drug use: No    Review of Systems Per HPI unless specifically indicated above     Objective:    BP 127/71   Pulse 67   Temp 99.7 F (37.6 C) (Oral)   Resp 16   Ht 5\' 7"  (1.702 m)   Wt 267 lb (121.1 kg)   BMI 41.82 kg/m   Wt Readings from Last 3 Encounters:  10/28/17 267 lb (121.1 kg)  10/28/17 267 lb 1.6 oz (121.2 kg)  10/03/17 264 lb (119.7 kg)    Physical Exam  Constitutional: She is oriented to person, place, and time. She appears well-developed and well-nourished. No distress.  Well-appearing, does not appear in pain mostly comfortable except when moving from seated to standing, cooperative, morbidly obese  HENT:  Head: Normocephalic and atraumatic.  Mouth/Throat: Oropharynx is clear and moist.  Frontal / maxillary sinuses non-tender. Nares patent without purulence or edema. Bilateral TMs clear without erythema, effusion or bulging, very minimal clear effusion fullness on R only. Oropharynx clear without erythema, exudates, edema or asymmetry.  Eyes: Conjunctivae are normal. Right  eye exhibits no discharge. Left eye exhibits no discharge.  Neck: Normal range of motion.  Cardiovascular: Normal rate, regular rhythm, normal heart sounds and intact distal pulses.  No murmur heard. Pulmonary/Chest: Effort normal and breath sounds normal. No respiratory distress. She has no wheezes. She has no rales.  Musculoskeletal: Normal range of motion. She exhibits no edema.  Low Back Inspection: Normal appearance, Large body habitus, no spinal deformity,  symmetrical. Palpation: No tenderness over spinous processes. Right lower thoracic to R lower lumbar paraspinal muscles with mild hypertonicity and mild tender to palpation ROM: Mostly active intact full active ROM forward flex / back extension with some slight limitation, rotation L/R without discomfort Special Testing: Seated SLR negative for radicular pain bilaterally. Admits some "tightness" or "pulling" on R low back with forward flexion too Strength: Bilateral hip flex/ext 5/5, knee flex/ext 5/5, ankle dorsiflex/plantarflex 5/5 Neurovascular: intact distal sensation to light touch  No CVAT  Neurological: She is alert and oriented to person, place, and time.  Skin: Skin is warm and dry. No rash noted. She is not diaphoretic. No erythema.  Psychiatric: Her behavior is normal.  Nursing note and vitals reviewed.  Results for orders placed or performed in visit on 10/28/17  POCT Urinalysis Dipstick  Result Value Ref Range   Color, UA yellow    Clarity, UA clear    Glucose, UA negative    Bilirubin, UA negative    Ketones, UA negative    Spec Grav, UA 1.020 1.010 - 1.025   Blood, UA negative    pH, UA 5.0 5.0 - 8.0   Protein, UA negative    Urobilinogen, UA 0.2 0.2 or 1.0 E.U./dL   Nitrite, UA negative    Leukocytes, UA Negative Negative      Assessment & Plan:   Problem List Items Addressed This Visit    Acute right-sided low back pain - Primary  Acute on chronic R LBP without associated sciatica (some localized left anterior thigh numbness difficult to appreciate on exam, may be chronic related to other causes. Suspect likely due to muscle spasm/strain, with presumed injury lifting grandson and carrying heavy bag - Prior OA/DJD spine - No red flag symptoms. Negative SLR for radiculopathy - Inadequate conservative therapy  - UA entirely negative, no actual symptoms of UTI, she requested UA due to her own concerns  Plan: 1. Start anti-inflammatory trial with rx Meloxicam  15mg  QD wc x 2-4 weeks, then PRN - Stop ibuprofen 2. Start muscle relaxant with Baclofen 10mg  tabs - take 5-10mg  up to TID PRN, titrate up as tolerated, caution sedation, goal less side effects compared to Flexeril 3. May use Tylenol PRN for breakthrough 4. Encouraged use of heating pad 1-2x daily for now then PRN 5. RTC 1 month, re-evaluation. If not improved consider X-ray imaging given >6 weeks. Consider trial of PT for strengthening.    Relevant Medications   meloxicam (MOBIC) 15 MG tablet   baclofen (LIORESAL) 10 MG tablet   Other Relevant Orders   POCT Urinalysis Dipstick (Completed)   Spondylosis of lumbar region without myelopathy or radiculopathy   Relevant Medications   meloxicam (MOBIC) 15 MG tablet   baclofen (LIORESAL) 10 MG tablet      #Ear Pain Normal exam, history non contributory seems lingering sinus symptoms. No indication for repeat antibiotics or other therapy at this time.  #Morbid Obesity Not focus of visit today due to acute visit, and her chronic weight is not recent change. Briefly reviewed basic starting  strategy with her working on improving diet first, handout given, reviewed some basic goals, improve diet first then gradually work on exercise to avoid re-injury and improve endurance. - Future consider referral [facility] or other option  Meds ordered this encounter  Medications  . meloxicam (MOBIC) 15 MG tablet    Sig: Take 1 tablet (15 mg total) daily by mouth.    Dispense:  30 tablet    Refill:  0  . baclofen (LIORESAL) 10 MG tablet    Sig: Take 0.5-1 tablets (5-10 mg total) 3 (three) times daily as needed by mouth for muscle spasms.    Dispense:  30 each    Refill:  1    Follow up plan: Return in about 4 weeks (around 11/25/2017) for Low back Pain.  [name], [facility] 10/28/2017, 11:18 PM

## 2017-11-04 ENCOUNTER — Encounter: Payer: Self-pay | Admitting: Nurse Practitioner

## 2017-11-04 ENCOUNTER — Ambulatory Visit (INDEPENDENT_AMBULATORY_CARE_PROVIDER_SITE_OTHER): Payer: Medicare Other | Admitting: Nurse Practitioner

## 2017-11-04 ENCOUNTER — Other Ambulatory Visit: Payer: Self-pay

## 2017-11-04 VITALS — BP 111/52 | HR 70 | Temp 99.4°F | Ht 67.0 in | Wt 268.8 lb

## 2017-11-04 DIAGNOSIS — J3489 Other specified disorders of nose and nasal sinuses: Secondary | ICD-10-CM | POA: Diagnosis not present

## 2017-11-04 DIAGNOSIS — G43011 Migraine without aura, intractable, with status migrainosus: Secondary | ICD-10-CM

## 2017-11-04 MED ORDER — SUMATRIPTAN 5 MG/ACT NA SOLN: 1.0000 | NASAL | 0 refills | Status: DC | PRN

## 2017-11-04 NOTE — Progress Notes (Signed)
Subjective:    Patient ID: Amanda Webb, female    DOB: 29-Aug-1971, 46 y.o.   MRN: [id number]  Amanda Webb is a 46 y.o. female presenting on 11/04/2017 for Headache (headache on Saturday, that subided the next day. Facial pressure only on the right side. Tenderness in the Right temporal lobes. )   HPI   Migraine Headache - Migraine headache on Saturday, Whole head.  Photophobia, No phonophobia.  She then proceeded to have mild headache and migraine post-drome fatigue.  Has significant sensitivity at right temple, but not sensitive to move hair/brush hair.  Pt has been looking up information about temple pain and is concerned about Giant Cell Arteritis. - Pt notes her grandson accidentally kicked her in the right ear/temple a couple days ago. - Headache today w/ whole head affect, Throbbing.  Can see pulsatility in vision. - Has only been taking ibuprofen, complete dark/sinus.  Pt is not taking meloxicam at this time.   Sinus pressure - Pt also endorses R sided face/sinus pressure - R ear has felt muffled, Previously told has fluid behind ear.  - Pt notes she has had repeated sinus infections in recent months.  Used to receive allergy shots at a previous provider, but has not had these for the last approximately 3 years.  Pt denies fever, chills, sweats, nausea, vomiting, diarrhea and constipation except for 4 x mushy BM today.    Social History   Tobacco Use  . Smoking status: Current Every Day Smoker    Packs/day: 0.50    Years: 30.00    Pack years: 15.00    Types: Cigarettes  . Smokeless tobacco: Current User  Substance Use Topics  . Alcohol use: No  . Drug use: No    Review of Systems Per HPI unless specifically indicated above     Objective:    BP (!) 111/52 (BP Location: Right Arm, Patient Position: Sitting, Cuff Size: Large)   Pulse 70   Temp 99.4 F (37.4 C) (Oral)   Ht 5\' 7"  (1.702 m)   Wt 268 lb 12.8 oz (121.9 kg)   BMI 42.10 kg/m   Wt  Readings from Last 3 Encounters:  11/04/17 268 lb 12.8 oz (121.9 kg)  10/28/17 267 lb (121.1 kg)  10/28/17 267 lb 1.6 oz (121.2 kg)    Physical Exam  Constitutional: She is oriented to person, place, and time. She appears well-developed and well-nourished. No distress.  HENT:  Head: Normocephalic and atraumatic.  Right Ear: Hearing, tympanic membrane, external ear and ear canal normal.  Left Ear: Hearing, tympanic membrane, external ear and ear canal normal.  Nose: Rhinorrhea present. Right sinus exhibits maxillary sinus tenderness and frontal sinus tenderness. Left sinus exhibits frontal sinus tenderness. Left sinus exhibits no maxillary sinus tenderness.  Mouth/Throat: Uvula is midline, oropharynx is clear and moist and mucous membranes are normal. No oropharyngeal exudate.  Eyes: Conjunctivae, EOM and lids are normal. Pupils are equal, round, and reactive to light.  Photophobia noted on pupil exam  Neck: Normal range of motion. Neck supple. No JVD present.  Cardiovascular: Normal rate, regular rhythm, normal heart sounds and intact distal pulses.  Pulmonary/Chest: Effort normal and breath sounds normal. No respiratory distress.  Neurological: She is alert and oriented to person, place, and time. No cranial nerve deficit.  Denies sensitivity w/ light touch to bilateral temples.  Pt endorses pain w/ moderate tough to right temple only.  Skin: Skin is warm and dry.     Results  for orders placed or performed in visit on 10/28/17  POCT Urinalysis Dipstick  Result Value Ref Range   Color, UA yellow    Clarity, UA clear    Glucose, UA negative    Bilirubin, UA negative    Ketones, UA negative    Spec Grav, UA 1.020 1.010 - 1.025   Blood, UA negative    pH, UA 5.0 5.0 - 8.0   Protein, UA negative    Urobilinogen, UA 0.2 0.2 or 1.0 E.U./dL   Nitrite, UA negative    Leukocytes, UA Negative Negative      Assessment & Plan:   Problem List Items Addressed This Visit    None      Visit Diagnoses    Intractable migraine without aura and with status migrainosus    -  Primary Pt w/ acute migraine headache onset 3 days ago w/ returning headache today and general malaise after initial improvement.  Pt w/ inadequate abortive therapy for migraines w/ only 400 mg ibuprofen.  No current neurological deficits, but does endorse photophobia w/ pupil exam.  Plan: 1. Instructed pt can take 600-800 mg ibuprofen as abortive therapy. 2. May also use sumatriptan 1 spray into one nostril once every 2 hours for max 3 doses at onset of migraine headache in future. 3. Followup as needed.   Relevant Medications   SUMAtriptan (IMITREX) 5 MG/ACT nasal spray   Sinus pressure     Pt notes she has had repeated sinus infections in recent months.  Used to receive allergy shots at a previous provider, but has not had these for the last approximately 3 years.  She would like to discuss possibility of discussing allergy shots w/ an ENT provider.  Plan: 1. Referral ENT.   2. Encouraged pt to continue allergy medications Zyrtec and Flonase. 3. Followup as needed.   Relevant Orders   Ambulatory referral to ENT      Meds ordered this encounter  Medications  . SUMAtriptan (IMITREX) 5 MG/ACT nasal spray    Sig: Place 1 spray (5 mg total) every 2 (two) hours as needed into the nose for migraine. Max 3 doses in 24 hours    Dispense:  1 Inhaler    Refill:  0    Order Specific Question:   Supervising Provider    Answer:   Olin Hauser [[id number]]    Follow up plan: Return 5-7 days if symptoms worsen or fail to improve.   Cassell Smiles, DNP, AGPCNP-BC Adult Gerontology Primary Care Nurse Practitioner [facility] 11/05/2017, 3:04 PM

## 2017-11-04 NOTE — Patient Instructions (Addendum)
Amanda Webb, Thank you for coming in to clinic today.  1. For your migraine: - START sumatriptan 1 spray in one nostril every 2 hours up to 3 doses in 24 hours. - Take 600 mg ibuprofen for abortive therapy for your migraine.  2. For your sinus headaches: - I am sending a referral for an allergy doctor at [facility] so you can discuss resuming your allergy shots.  Please schedule a follow-up appointment with Cassell Smiles, AGNP. Return 5-7 days if symptoms worsen or fail to improve.  If you have any other questions or concerns, please feel free to call the clinic or send a message through [place]. You may also schedule an earlier appointment if necessary.  You will receive a survey after today's visit either digitally by e-mail or paper by [employer]. Your experiences and feedback matter to [place].  Please respond so we know how we are doing as we provide care for you.   Cassell Smiles, DNP, AGNP-BC Adult Gerontology Nurse Practitioner [facility] [place]   Migraine Headache A migraine headache is an intense, throbbing pain on one side or both sides of the head. Migraines may also cause other symptoms, such as nausea, vomiting, and sensitivity to light and noise. What are the causes? Doing or taking certain things may also trigger migraines, such as:  Alcohol.  Smoking.  Medicines, such as: ? Medicine used to treat chest pain (nitroglycerine). ? Birth control pills. ? Estrogen pills. ? Certain blood pressure medicines.  Aged cheeses, chocolate, or caffeine.  Foods or drinks that contain nitrates, glutamate, aspartame, or tyramine.  Physical activity.  Other things that may trigger a migraine include:  Menstruation.  Pregnancy.  Hunger.  Stress, lack of sleep, too much sleep, or fatigue.  Weather changes.  What increases the risk? The following factors may make you more likely to experience migraine headaches:  Age. Risk increases with age.  Family  history of migraine headaches.  Being Caucasian.  Depression and anxiety.  Obesity.  Being a woman.  Having a hole in the heart (patent foramen ovale) or other heart problems.  What are the signs or symptoms? The main symptom of this condition is pulsating or throbbing pain. Pain may:  Happen in any area of the head, such as on one side or both sides.  Interfere with daily activities.  Get worse with physical activity.  Get worse with exposure to bright lights or loud noises.  Other symptoms may include:  Nausea.  Vomiting.  Dizziness.  General sensitivity to bright lights, loud noises, or smells.  Before you get a migraine, you may get warning signs that a migraine is developing (aura). An aura may include:  Seeing flashing lights or having blind spots.  Seeing bright spots, halos, or zigzag lines.  Having tunnel vision or blurred vision.  Having numbness or a tingling feeling.  Having trouble talking.  Having muscle weakness.  How is this diagnosed? A migraine headache can be diagnosed based on:  Your symptoms.  A physical exam.  Tests, such as CT scan or MRI of the head. These imaging tests can help rule out other causes of headaches.  Taking fluid from the spine (lumbar puncture) and analyzing it (cerebrospinal fluid analysis, or CSF analysis).  How is this treated? A migraine headache is usually treated with medicines that:  Relieve pain.  Relieve nausea.  Prevent migraines from coming back.  Treatment may also include:  Acupuncture.  Lifestyle changes like avoiding foods that trigger  migraines.  Follow these instructions at home: Medicines  Take over-the-counter and prescription medicines only as told by your health care provider.  Do not drive or use heavy machinery while taking prescription pain medicine.  To prevent or treat constipation while you are taking prescription pain medicine, your health care provider may recommend  that you: ? Drink enough fluid to keep your urine clear or pale yellow. ? Take over-the-counter or prescription medicines. ? Eat foods that are high in fiber, such as fresh fruits and vegetables, whole grains, and beans. ? Limit foods that are high in fat and processed sugars, such as fried and sweet foods. Lifestyle  Avoid alcohol use.  Do not use any products that contain nicotine or tobacco, such as cigarettes and e-cigarettes. If you need help quitting, ask your health care provider.  Get at least 8 hours of sleep every night.  Limit your stress. General instructions   Keep a journal to find out what may trigger your migraine headaches. For example, write down: ? What you eat and drink. ? How much sleep you get. ? Any change to your diet or medicines.  If you have a migraine: ? Avoid things that make your symptoms worse, such as bright lights. ? It may help to lie down in a dark, quiet room. ? Do not drive or use heavy machinery. ? Ask your health care provider what activities are safe for you while you are experiencing symptoms.  Keep all follow-up visits as told by your health care provider. This is important. Contact a health care provider if:  You develop symptoms that are different or more severe than your usual migraine symptoms. Get help right away if:  Your migraine becomes severe.  You have a fever.  You have a stiff neck.  You have vision loss.  Your muscles feel weak or like you cannot control them.  You start to lose your balance often.  You develop trouble walking.  You faint. This information is not intended to replace advice given to you by your health care provider. Make sure you discuss any questions you have with your health care provider. Document Released: 12/03/2005 Document Revised: 06/22/2016 Document Reviewed: 05/21/2016 Elsevier Interactive Patient Education  2017 [employer].

## 2017-11-05 ENCOUNTER — Encounter: Payer: Self-pay | Admitting: Nurse Practitioner

## 2017-11-14 ENCOUNTER — Telehealth: Payer: Self-pay

## 2017-11-14 NOTE — Telephone Encounter (Signed)
Pt called requesting labs to get her iron level check, because she stay cold all the time. I informed the pt that you was out of the office and we will call her back next week to let her know of your recommendation.

## 2017-11-15 ENCOUNTER — Ambulatory Visit: Payer: Medicare Other | Admitting: Nurse Practitioner

## 2017-11-15 NOTE — Telephone Encounter (Signed)
Pt had initial scheduled [facility] for annual labs, so we called her.  Her last physical was 12/28/2016 and she had her GYN wellness exam 03/2017.  Instructed Kelita to discuss the reason for [facility] and tell Amanda Webb she could not have annual physical exam until 12/29/17.

## 2017-11-21 ENCOUNTER — Encounter: Payer: Self-pay | Admitting: Nurse Practitioner

## 2017-11-21 ENCOUNTER — Other Ambulatory Visit: Payer: Self-pay

## 2017-11-21 ENCOUNTER — Ambulatory Visit (INDEPENDENT_AMBULATORY_CARE_PROVIDER_SITE_OTHER): Payer: Medicare Other | Admitting: Nurse Practitioner

## 2017-11-21 VITALS — BP 112/71 | HR 96 | Temp 99.5°F | Ht 67.0 in | Wt 262.4 lb

## 2017-11-21 DIAGNOSIS — R6883 Chills (without fever): Secondary | ICD-10-CM | POA: Diagnosis not present

## 2017-11-21 DIAGNOSIS — J324 Chronic pansinusitis: Secondary | ICD-10-CM | POA: Diagnosis not present

## 2017-11-21 DIAGNOSIS — Z862 Personal history of diseases of the blood and blood-forming organs and certain disorders involving the immune mechanism: Secondary | ICD-10-CM

## 2017-11-21 DIAGNOSIS — J0141 Acute recurrent pansinusitis: Secondary | ICD-10-CM | POA: Diagnosis not present

## 2017-11-21 LAB — CBC WITH DIFFERENTIAL/PLATELET
Basophils Absolute: 38 cells/uL (ref 0–200)
Basophils Relative: 0.4 %
Eosinophils Absolute: 190 cells/uL (ref 15–500)
Eosinophils Relative: 2 %
HCT: 36.5 % (ref 35.0–45.0)
Hemoglobin: 12.1 g/dL (ref 11.7–15.5)
Lymphs Abs: 3050 cells/uL (ref 850–3900)
MCH: 27.8 pg (ref 27.0–33.0)
MCHC: 33.2 g/dL (ref 32.0–36.0)
MCV: 83.7 fL (ref 80.0–100.0)
MPV: 10.4 fL (ref 7.5–12.5)
Monocytes Relative: 5.3 %
Neutro Abs: 5719 cells/uL (ref 1500–7800)
Neutrophils Relative %: 60.2 %
Platelets: 288 10*3/uL (ref 140–400)
RBC: 4.36 10*6/uL (ref 3.80–5.10)
RDW: 14 % (ref 11.0–15.0)
Total Lymphocyte: 32.1 %
WBC mixed population: 504 cells/uL (ref 200–950)
WBC: 9.5 10*3/uL (ref 3.8–10.8)

## 2017-11-21 MED ORDER — AMOXICILLIN 500 MG PO TABS: 500.0000 mg | ORAL_TABLET | Freq: Two times a day (BID) | ORAL | 0 refills | Status: AC

## 2017-11-21 NOTE — Patient Instructions (Addendum)
Amanda Webb, Thank you for coming in to clinic today.  1. It sounds like you have a Bacterial sinus infection.  Recommend good hand washing. - START taking amoxicillin 500 mg twice daily for 10 days.  Make sure to take all doses of your antibiotic. - While you are on an antibiotic, take a probiotic.  Antibiotics kill good and bad bacteria.  A probiotic helps to replace your good bacteria. Probiotic pills can be found over the counter.  One brand is Florastor, but you can use any brand you prefer.  You can also get good bacteria from foods like yogurt - Continue anti-histamine cetirizine 10mg  daily. - Continue Flonase 2 sprays each nostril daily for up to 4-6 weeks  Other over the counter medications you may try, if needed for symptoms are: - If congestion is worse, start OTC Mucinex (or may try Mucinex-DM for cough) up to 7-10 days then stop - You may try over the counter Nasal Saline spray (Simply Saline, Ocean Spray) as needed to reduce congestion. - Start taking Tylenol extra strength 1 to 2 tablets every 6-8 hours for aches or fever/chills for next few days as needed.  Do not take more than 3,000 mg in 24 hours from all medicines.     If symptoms are significantly worse with persistent fevers/chills despite tylenol/ibpurofen, nausea, vomiting unable to tolerate food/fluids or medicine, body aches, or shortness of breath, sinus pain pressure or worsening productive cough, then follow-up for re-evaluation, may seek more immediate care at Urgent Care or the ED if you are more concerned that it is an emergency.  Please schedule a follow-up appointment with Cassell Smiles, AGNP. Return 7-10 days if symptoms worsen or fail to improve.  If you have any other questions or concerns, please feel free to call the clinic or send a message through [place]. You may also schedule an earlier appointment if necessary.  You will receive a survey after today's visit either digitally by e-mail or paper by [employer].  Your experiences and feedback matter to [place].  Please respond so we know how we are doing as we provide care for you.   Cassell Smiles, DNP, AGNP-BC Adult Gerontology Nurse Practitioner [place]

## 2017-11-21 NOTE — Progress Notes (Signed)
Subjective:    Patient ID: Amanda Webb, female    DOB: [birth year], 46 y.o.   MRN: [id number]  Amanda Webb is a 46 y.o. female presenting on 11/21/2017 for Ear Pain (swelling on the left side of the face x 3 days )   HPI Ear pain Pt presents today w/ worsening face swelling, upper jaw pain and ear pain on left side.  Reports subjective fever w/ chills and sweats over last 7 days.  Ear fullness has not resolved since November 19 even with daily Zyrtec and Flonase.  Now involves entire left side of face. Denies cough, congestion, nasal drainage. - Awaiting ENT on 11/27/17 as discussed on 11/19 for consideration of recurrent sinusitis and allergies. - Pt called her dentist w/ reports of upper left molar pain.  Dentist did panorex this morning and was normal. He did give her amox 500 mg qid x 5 days empirically.  Pt has no left upper molars remaining as they have been previously extracted. - Pt continues to smoke and has no desire to stop at this time.  Chills Pt called office 7 days ago for her labs to be collected for annual physical.  Pt had annual physical on 12/28/16 and was not due for this  Pt was advised about this and she cancelled her appointment.  Pt today again has concerns that her chills are related to anemia.  Desires having her "iron checked."  Social History   Tobacco Use  . Smoking status: Current Every Day Smoker    Packs/day: 0.50    Years: 30.00    Pack years: 15.00    Types: Cigarettes  . Smokeless tobacco: Current User  Substance Use Topics  . Alcohol use: No  . Drug use: No    Review of Systems Per HPI unless specifically indicated above     Objective:    BP 112/71 (BP Location: Right Arm, Patient Position: Sitting, Cuff Size: Large)   Pulse 96   Temp 99.5 F (37.5 C) (Oral)   Ht 5\' 7"  (1.702 m)   Wt 262 lb 6.4 oz (119 kg)   BMI 41.10 kg/m   Wt Readings from Last 3 Encounters:  11/21/17 262 lb 6.4 oz (119 kg)  11/04/17 268 lb 12.8 oz  (121.9 kg)  10/28/17 267 lb (121.1 kg)    Physical Exam  Constitutional: She appears well-developed and well-nourished.  HENT:  Head: Normocephalic and atraumatic.    Right Ear: Hearing, tympanic membrane, external ear and ear canal normal.  Left Ear: Hearing, tympanic membrane, external ear and ear canal normal.  Nose: Rhinorrhea present. Right sinus exhibits no maxillary sinus tenderness and no frontal sinus tenderness. Left sinus exhibits maxillary sinus tenderness and frontal sinus tenderness.  Mouth/Throat: Uvula is midline, oropharynx is clear and moist and mucous membranes are normal.  Lymphadenopathy:       Head (right side): Submandibular, preauricular and posterior auricular adenopathy present.    She has cervical adenopathy.       Right cervical: Superficial cervical and posterior cervical adenopathy present.  Neurological: She is alert.  Skin: Skin is warm and dry.  Psychiatric: She has a normal mood and affect. Her behavior is normal. Judgment and thought content normal.  Vitals reviewed.  Results for orders placed or performed in visit on 10/28/17  POCT Urinalysis Dipstick  Result Value Ref Range   Color, UA yellow    Clarity, UA clear    Glucose, UA negative    Bilirubin, UA  negative    Ketones, UA negative    Spec Grav, UA 1.020 1.010 - 1.025   Blood, UA negative    pH, UA 5.0 5.0 - 8.0   Protein, UA negative    Urobilinogen, UA 0.2 0.2 or 1.0 E.U./dL   Nitrite, UA negative    Leukocytes, UA Negative Negative      Assessment & Plan:   Problem List Items Addressed This Visit    None    Visit Diagnoses    Recurrent acute pansinusitis    -  Primary Consistent with allergic rhinitis w/ recurrent sinusitis with symptoms worsening over the past 7 days with new onset chills and sweats.  Initial symptoms of ear pain, face swelling,nasal congestion and sinus pressure over 2 weeks ago.   Pt awaiting ENT consultation on 11/27/17.  Plan: 1.START taking amoxicillin  500 mg tablets every 12 hours for 10 days.  Discussed completing antibiotic. - While on antibiotic, take a probiotic OTC or from food. - Continue anti-histamine cetirizine 10mg  daily. - Can use Flonase 2 sprays each nostril daily for up to 4-6 weeks if no epistaxis. - Start Mucinex-DM OTC for  7-10 days prn congestion 2. Supportive care with nasal saline, warm herbal tea with honey, 3. Improve hydration 4. Tylenol / Motrin PRN fevers  5. Return criteria given    Relevant Medications   amoxicillin (AMOXIL) 500 MG tablet   Chills       History of iron deficiency anemia     Pt desires check for iron deficiency anemia.  She reports family history of thalassemia, personal history of anemia during pregnancy.  No other personal history of anemia.  Plan: 1. CBC w/ differential today.  Add-on iron panel if anemic.  Pt verbalizes understanding that iron panel will likely be normal if CBC is normal. 2. Followup after labs as needed.   Relevant Orders   CBC with Differential/Platelet      Meds ordered this encounter  Medications  . amoxicillin (AMOXIL) 500 MG tablet    Sig: Take 1 tablet (500 mg total) by mouth 2 (two) times daily for 10 days.    Dispense:  20 tablet    Refill:  0    Order Specific Question:   Supervising Provider    Answer:   Olin Hauser [[id number]]    Follow up plan: Return 7-10 days if symptoms worsen or fail to improve.  Cassell Smiles, DNP, AGPCNP-BC Adult Gerontology Primary Care Nurse Practitioner [facility] 11/21/2017, 10:17 AM

## 2017-12-12 DIAGNOSIS — J019 Acute sinusitis, unspecified: Secondary | ICD-10-CM | POA: Diagnosis not present

## 2017-12-19 ENCOUNTER — Other Ambulatory Visit: Payer: Self-pay

## 2017-12-19 DIAGNOSIS — F17200 Nicotine dependence, unspecified, uncomplicated: Secondary | ICD-10-CM | POA: Diagnosis not present

## 2017-12-19 DIAGNOSIS — J301 Allergic rhinitis due to pollen: Secondary | ICD-10-CM | POA: Diagnosis not present

## 2017-12-19 DIAGNOSIS — J320 Chronic maxillary sinusitis: Secondary | ICD-10-CM | POA: Diagnosis not present

## 2017-12-19 DIAGNOSIS — I10 Essential (primary) hypertension: Secondary | ICD-10-CM

## 2017-12-19 DIAGNOSIS — J343 Hypertrophy of nasal turbinates: Secondary | ICD-10-CM | POA: Diagnosis not present

## 2017-12-19 MED ORDER — ATENOLOL 50 MG PO TABS: 50.0000 mg | ORAL_TABLET | Freq: Every day | ORAL | 11 refills | Status: DC

## 2017-12-20 DIAGNOSIS — Z79899 Other long term (current) drug therapy: Secondary | ICD-10-CM | POA: Diagnosis not present

## 2017-12-20 DIAGNOSIS — F4310 Post-traumatic stress disorder, unspecified: Secondary | ICD-10-CM | POA: Diagnosis not present

## 2017-12-26 ENCOUNTER — Encounter: Payer: Self-pay | Admitting: Obstetrics and Gynecology

## 2018-01-07 ENCOUNTER — Encounter: Payer: Self-pay | Admitting: Family Medicine

## 2018-01-13 ENCOUNTER — Ambulatory Visit: Payer: Medicare Other

## 2018-01-17 ENCOUNTER — Other Ambulatory Visit: Payer: Self-pay | Admitting: Internal Medicine

## 2018-01-17 ENCOUNTER — Encounter: Payer: Self-pay | Admitting: Obstetrics and Gynecology

## 2018-01-17 ENCOUNTER — Ambulatory Visit (INDEPENDENT_AMBULATORY_CARE_PROVIDER_SITE_OTHER): Payer: Medicare Other | Admitting: Obstetrics and Gynecology

## 2018-01-17 VITALS — BP 106/68 | HR 81 | Wt 259.0 lb

## 2018-01-17 DIAGNOSIS — Z3042 Encounter for surveillance of injectable contraceptive: Secondary | ICD-10-CM

## 2018-01-17 MED ORDER — MEDROXYPROGESTERONE ACETATE 150 MG/ML IM SUSP
150.0000 mg | Freq: Once | INTRAMUSCULAR | Status: AC
Start: 2018-01-17 — End: 2018-01-17
  Administered 2018-01-17: 150 mg via INTRAMUSCULAR

## 2018-01-17 MED ORDER — ALBUTEROL SULFATE HFA 108 (90 BASE) MCG/ACT IN AERS: INHALATION_SPRAY | RESPIRATORY_TRACT | 0 refills | Status: DC

## 2018-01-17 NOTE — Progress Notes (Signed)
Last depo inj:10/28/17 UPT:no Side effects:none Next Depo- Provera injection due: 4/19/-04/18/18 Annual exam due:Pt is here for a depo inj. States she started spotting 1 week ago with some cramping. Pt will make appointment with provider if she has any more concerns or questions. BP 106/68   Pulse 81   Wt 259 lb (117.5 kg)   BMI 40.57 kg/m    I have reviewed the record and concur with patient management and plan. DEFRANCESCO, Hassell Done, MD, Cherlynn June

## 2018-01-31 ENCOUNTER — Other Ambulatory Visit: Payer: Self-pay | Admitting: *Deleted

## 2018-01-31 ENCOUNTER — Other Ambulatory Visit: Payer: Self-pay | Admitting: Internal Medicine

## 2018-01-31 MED ORDER — ALBUTEROL SULFATE HFA 108 (90 BASE) MCG/ACT IN AERS: INHALATION_SPRAY | RESPIRATORY_TRACT | 0 refills | Status: DC

## 2018-02-12 ENCOUNTER — Other Ambulatory Visit: Payer: Self-pay | Admitting: Family Medicine

## 2018-02-12 DIAGNOSIS — E039 Hypothyroidism, unspecified: Secondary | ICD-10-CM

## 2018-02-12 MED ORDER — LEVOTHYROXINE SODIUM 50 MCG PO TABS: 50.0000 ug | ORAL_TABLET | Freq: Every morning | ORAL | 5 refills | Status: DC

## 2018-03-06 ENCOUNTER — Encounter: Payer: Self-pay | Admitting: Obstetrics and Gynecology

## 2018-03-10 ENCOUNTER — Other Ambulatory Visit: Payer: Self-pay | Admitting: Family Medicine

## 2018-03-10 ENCOUNTER — Ambulatory Visit (INDEPENDENT_AMBULATORY_CARE_PROVIDER_SITE_OTHER): Payer: Medicare Other | Admitting: Family Medicine

## 2018-03-10 ENCOUNTER — Encounter: Payer: Self-pay | Admitting: Family Medicine

## 2018-03-10 VITALS — BP 130/70 | HR 68 | Temp 99.4°F | Resp 16 | Ht 67.0 in | Wt 258.8 lb

## 2018-03-10 DIAGNOSIS — E782 Mixed hyperlipidemia: Secondary | ICD-10-CM

## 2018-03-10 DIAGNOSIS — R7303 Prediabetes: Secondary | ICD-10-CM

## 2018-03-10 DIAGNOSIS — I10 Essential (primary) hypertension: Secondary | ICD-10-CM | POA: Diagnosis not present

## 2018-03-10 DIAGNOSIS — E039 Hypothyroidism, unspecified: Secondary | ICD-10-CM

## 2018-03-10 DIAGNOSIS — R609 Edema, unspecified: Secondary | ICD-10-CM

## 2018-03-10 DIAGNOSIS — F411 Generalized anxiety disorder: Secondary | ICD-10-CM

## 2018-03-10 DIAGNOSIS — Z6841 Body Mass Index (BMI) 40.0 and over, adult: Secondary | ICD-10-CM
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
## 2018-03-10 NOTE — Patient Instructions (Addendum)
Thank you for coming to the office today.  Stress is likely causing headache  I don't think this is causing swelling - but we can check blood work and consider an ECHO in the future for heart test  I would contact Dr Rockey Situ to review the palpitations again if you have a new concern or want to follow-up on this issue  Try to reduce sweet tea intake - switch to water intake  PSYCHIATRY / [place]  Internal Referral [place] ([place] [facility]) Address: [address], [place], [place] [address] Hours: 8:30AM-5PM Phone: [phone]  [place], [place] [address] Phone: [phone]  Self Referral  [facility] (All ages) [address] [place], [id number] Phone: [phone] (Option 1) [email or web address]  Dr Carlos Levering Su Dr Sherri Rad Dr Randol Kern  Psychologists, NP, PAs  (Also CBC offices in [place], [place])  [facility] [facility]) [facility] [address] [place], [place] [address] Phone: [phone]  [employer], available walk-in 9am-4pm M-F [address], [place] [address] Hours: [place] (M-F, walk in available) [phone]   Address: [place], [place], [place] [address] Hours: 8AM-5PM (accepts walk in to establish) Phone: [phone]  ----------------------------------------------------------------- PSYCHOLOGY COUNSELING ONLY  [facility]  Buena Irish, LCSW [address] [place], [place]: [phone]   Self Referral:  1. Karen [place]   Address: [address], [place], [place] [address] Hours: Open today  9AM-7PM Phone: [phone]  2. [place], [place] Address: [address] [place], [place], [place] [address] Phone: [phone]  [place]   Address: [place], [place], [place] [address] Hours: Tues 4:15PM-8PM // Weds 9am - 12pm // Raynelle Dick 1pm - 8pm (Closed other days) Phone: [phone]  DUE for FASTING BLOOD WORK (no food or drink after midnight before the lab appointment, only water or coffee without cream/sugar on the morning of)  SCHEDULE "Lab Only" visit in the morning at the clinic for lab draw in 1-2 WEEKS   - Make sure Lab Only appointment is at about 1 week before your next appointment, so that results will be available  For Lab Results, once available within 2-3 days of blood draw, you can can log in to MyChart online to view your results and a brief explanation. Also, we can discuss results at next follow-up visit.  Please schedule a Follow-up Appointment to: Return in about 2 weeks (around 03/24/2018) for Headache, Swelling, Anxiety.  If you have any other questions or concerns, please feel free to call the office or send a message through [place]. You may also schedule an earlier appointment if necessary.  Additionally, you may be receiving a survey about your experience at our office within a few days to 1 week by e-mail or mail. We value your feedback.  Amanda Putnam, DO [place]

## 2018-03-10 NOTE — Assessment & Plan Note (Signed)
Minimal peripheral edema currently, without ankle or foot edema She is concerned about some mild finger or hand edema Last labs 2017, without obvious etiology, had normal Cr and TSH Recommend return for labs in 1 week Discussed dietary changes, suspect inc tea among other dietary choices may impact edema, recommend inc water hydration Previously on thiazide, may need to resume this in near future Follow-up in near future may consider ECHO if worsening concerns with BP, palpitations among other

## 2018-03-10 NOTE — Assessment & Plan Note (Signed)
Suspect primary underlying etiology with acute stress causing her symptoms of headache and blood pressure fluctuation. She is visibly stressed and frustrated today - Followed by Psychiatry in [place], [place], psych and therapy - Already on Fluvoxamine, Lamictal, Seroquel - Emphasized importance of trying to control life stressors and address these issues, explained I have limited options to change these, and I'm concerned her anxiety / stress are unlikely to resolve without addressing these issues - Advised her to follow-up closely with her Psychiatry / Therapist and continue to address issue and adjust medicines as needed - Also provided closer to home follow-up options for mental health, given list, and advised her to call to find local options that accept her insurance

## 2018-03-11 NOTE — Assessment & Plan Note (Signed)
Currently normal BP, reportedly elevated at home without log today Prior history of palpitations had Cardiology f/u and holter monitor without concern No known complications  Previously on HCTZ   Plan:  1. Continue current BP regimen - Atenolol 50mg  daily 2. Encourage improved lifestyle - improve hydration, low sodium diet, regular exercise 3. Continue monitor BP outside office, bring readings to next visit, if persistently >140/90 or new symptoms notify office sooner 4. Follow-up labs 1 week, then if stable and persistent elevated BP intermittently would consider adding back thiazide for swelling and BP - however ultimately advised need to control stress anxiety to help BP

## 2018-03-12 ENCOUNTER — Other Ambulatory Visit: Payer: Medicare Other

## 2018-03-12 ENCOUNTER — Encounter: Payer: Self-pay | Admitting: Family Medicine

## 2018-03-12 DIAGNOSIS — I10 Essential (primary) hypertension: Secondary | ICD-10-CM | POA: Diagnosis not present

## 2018-03-12 DIAGNOSIS — Z6841 Body Mass Index (BMI) 40.0 and over, adult: Secondary | ICD-10-CM | POA: Diagnosis not present

## 2018-03-12 DIAGNOSIS — E039 Hypothyroidism, unspecified: Secondary | ICD-10-CM

## 2018-03-12 DIAGNOSIS — E782 Mixed hyperlipidemia: Secondary | ICD-10-CM | POA: Diagnosis not present

## 2018-03-12 DIAGNOSIS — R7303 Prediabetes: Secondary | ICD-10-CM

## 2018-03-12 DIAGNOSIS — D509 Iron deficiency anemia, unspecified: Secondary | ICD-10-CM

## 2018-03-12 DIAGNOSIS — R790 Abnormal level of blood mineral: Secondary | ICD-10-CM

## 2018-03-12 NOTE — Telephone Encounter (Signed)
Already responded to other mychart message today  [name], [facility] 03/12/2018, 12:30 PM

## 2018-03-13 LAB — T4, FREE: Free T4: 1.1 ng/dL (ref 0.8–1.8)

## 2018-03-13 LAB — CBC WITH DIFFERENTIAL/PLATELET
Basophils Absolute: 29 cells/uL (ref 0–200)
Basophils Relative: 0.3 %
Eosinophils Absolute: 176 cells/uL (ref 15–500)
Eosinophils Relative: 1.8 %
HCT: 32.7 % — ABNORMAL LOW (ref 35.0–45.0)
Hemoglobin: 11.2 g/dL — ABNORMAL LOW (ref 11.7–15.5)
Lymphs Abs: 2969 cells/uL (ref 850–3900)
MCH: 28.2 pg (ref 27.0–33.0)
MCHC: 34.3 g/dL (ref 32.0–36.0)
MCV: 82.4 fL (ref 80.0–100.0)
MPV: 10.3 fL (ref 7.5–12.5)
Monocytes Relative: 5.2 %
Neutro Abs: 6115 cells/uL (ref 1500–7800)
Neutrophils Relative %: 62.4 %
Platelets: 289 10*3/uL (ref 140–400)
RBC: 3.97 10*6/uL (ref 3.80–5.10)
RDW: 14.1 % (ref 11.0–15.0)
Total Lymphocyte: 30.3 %
WBC mixed population: 510 cells/uL (ref 200–950)
WBC: 9.8 10*3/uL (ref 3.8–10.8)

## 2018-03-13 LAB — COMPLETE METABOLIC PANEL WITH GFR
AG Ratio: 1.7 (calc) (ref 1.0–2.5)
ALT: 10 U/L (ref 6–29)
AST: 12 U/L (ref 10–35)
Albumin: 4 g/dL (ref 3.6–5.1)
Alkaline phosphatase (APISO): 80 U/L (ref 33–115)
BUN: 9 mg/dL (ref 7–25)
CO2: 28 mmol/L (ref 20–32)
Calcium: 9.3 mg/dL (ref 8.6–10.2)
Chloride: 109 mmol/L (ref 98–110)
Creat: 0.74 mg/dL (ref 0.50–1.10)
GFR, Est African American: 113 mL/min/{1.73_m2} (ref >=60)
GFR, Est Non African American: 98 mL/min/{1.73_m2} (ref >=60)
Globulin: 2.3 g/dL (calc) (ref 1.9–3.7)
Glucose, Bld: 115 mg/dL — ABNORMAL HIGH (ref 65–99)
Potassium: 3.9 mmol/L (ref 3.5–5.3)
Sodium: 142 mmol/L (ref 135–146)
Total Bilirubin: 0.3 mg/dL (ref 0.2–1.2)
Total Protein: 6.3 g/dL (ref 6.1–8.1)

## 2018-03-13 LAB — TEST AUTHORIZATION

## 2018-03-13 LAB — IRON,TIBC AND FERRITIN PANEL
%SAT: 8 % (calc) — ABNORMAL LOW (ref 11–50)
Ferritin: 11 ng/mL (ref 10–232)
Iron: 30 ug/dL — ABNORMAL LOW (ref 40–190)
TIBC: 362 mcg/dL (calc) (ref 250–450)

## 2018-03-13 LAB — TSH: TSH: 3.04 mIU/L

## 2018-03-13 LAB — LIPID PANEL
Cholesterol: 172 mg/dL (ref <200)
HDL: 36 mg/dL — ABNORMAL LOW (ref >50)
LDL Cholesterol (Calc): 112 mg/dL (calc) — ABNORMAL HIGH
Non-HDL Cholesterol (Calc): 136 mg/dL (calc) — ABNORMAL HIGH (ref <130)
Total CHOL/HDL Ratio: 4.8 (calc) (ref <5.0)
Triglycerides: 129 mg/dL (ref <150)

## 2018-03-13 LAB — HEMOGLOBIN A1C
Hgb A1c MFr Bld: 6.1 % of total Hgb — ABNORMAL HIGH (ref <5.7)
Mean Plasma Glucose: 128 (calc)
eAG (mmol/L): 7.1 (calc)

## 2018-03-14 ENCOUNTER — Telehealth: Payer: Self-pay | Admitting: Family Medicine

## 2018-03-14 ENCOUNTER — Encounter: Payer: Self-pay | Admitting: Family Medicine

## 2018-03-14 DIAGNOSIS — D509 Iron deficiency anemia, unspecified: Secondary | ICD-10-CM | POA: Insufficient documentation

## 2018-03-14 NOTE — Addendum Note (Signed)
Addended by: Olin Hauser on: 03/14/2018 01:17 PM   Modules accepted: Orders

## 2018-03-14 NOTE — Telephone Encounter (Signed)
Pt wanted to speak to someone about lab results [phone]

## 2018-03-14 NOTE — Telephone Encounter (Signed)
My chart communication done.

## 2018-03-24 ENCOUNTER — Encounter: Payer: Self-pay | Admitting: Hematology and Oncology

## 2018-03-24 ENCOUNTER — Inpatient Hospital Stay: Payer: Medicare Other

## 2018-03-24 ENCOUNTER — Other Ambulatory Visit: Payer: Self-pay

## 2018-03-24 ENCOUNTER — Inpatient Hospital Stay: Payer: Medicare Other | Attending: Hematology and Oncology | Admitting: Hematology and Oncology

## 2018-03-24 VITALS — BP 131/79 | HR 73 | Temp 99.8°F | Resp 18 | Ht 66.75 in | Wt 255.3 lb

## 2018-03-24 DIAGNOSIS — D649 Anemia, unspecified: Secondary | ICD-10-CM

## 2018-03-24 DIAGNOSIS — D509 Iron deficiency anemia, unspecified: Secondary | ICD-10-CM

## 2018-03-24 DIAGNOSIS — E611 Iron deficiency: Secondary | ICD-10-CM

## 2018-03-24 DIAGNOSIS — N939 Abnormal uterine and vaginal bleeding, unspecified: Secondary | ICD-10-CM | POA: Insufficient documentation

## 2018-03-24 DIAGNOSIS — R5383 Other fatigue: Secondary | ICD-10-CM | POA: Insufficient documentation

## 2018-03-24 LAB — VITAMIN B12: Vitamin B-12: 296 pg/mL (ref 180–914)

## 2018-03-24 LAB — RETICULOCYTES
RBC.: 4.29 MIL/uL (ref 3.80–5.20)
Retic Count, Absolute: 55.8 10*3/uL (ref 19.0–183.0)
Retic Ct Pct: 1.3 % (ref 0.4–3.1)

## 2018-03-24 LAB — CBC WITH DIFFERENTIAL/PLATELET
Basophils Absolute: 0.1 10*3/uL (ref 0–0.1)
Basophils Relative: 1 %
Eosinophils Absolute: 0.2 10*3/uL (ref 0–0.7)
Eosinophils Relative: 2 %
HCT: 35.7 % (ref 35.0–47.0)
Hemoglobin: 11.9 g/dL — ABNORMAL LOW (ref 12.0–16.0)
Lymphocytes Relative: 31 %
Lymphs Abs: 3.1 10*3/uL (ref 1.0–3.6)
MCH: 28.1 pg (ref 26.0–34.0)
MCHC: 33.4 g/dL (ref 32.0–36.0)
MCV: 84.2 fL (ref 80.0–100.0)
Monocytes Absolute: 0.5 10*3/uL (ref 0.2–0.9)
Monocytes Relative: 5 %
Neutro Abs: 6.2 10*3/uL (ref 1.4–6.5)
Neutrophils Relative %: 61 %
Platelets: 314 10*3/uL (ref 150–440)
RBC: 4.24 MIL/uL (ref 3.80–5.20)
RDW: 15 % — ABNORMAL HIGH (ref 11.5–14.5)
WBC: 10.1 10*3/uL (ref 3.6–11.0)

## 2018-03-24 LAB — FOLATE: Folate: 17.2 ng/mL (ref >5.9)

## 2018-03-24 MED ORDER — FERROUS SULFATE 325 (65 FE) MG PO TBEC: DELAYED_RELEASE_TABLET | ORAL | 0 refills | Status: DC

## 2018-03-24 NOTE — Progress Notes (Signed)
Patient here today as new evaluation regarding ferritin and iron being low.  Referred by [facility].  Patient states she is very tired all the time. States she eats junk.

## 2018-03-24 NOTE — Progress Notes (Signed)
[facility] day:  03/24/2018  Chief Complaint: Amanda Webb is a 47 y.o. female with low iron who is referred in consultation by Dr. [name] for assessment and management.  HPI:  Patient states that she has been anemic her entire life. Patient has been tired and short of breath. She has recently been referred to cardiology for further evaluation. She has had 2 holter studies, however they were "only able to catch one episode".  Event monitor studies from 06/17/2017 and 07/19/2017 were reviewed. Studies demonstrated no significant arrhythmia. There was one short run (5 beats) of tachycardia noted.   CBC on 11/21/2017 revealed a hematocrit of 36.5, hemoglobin 12.1, and MCV 83.7.  CBC on 03/12/2018 revealed a hematocrit of 32.7, hemoglobin 11.2, MCV 82.4, platelets 289,000, WBC 9800 with a normal differential.  Creatinine was 0.74.  LFTs were normal.  TSH and free T4 were normal.  Ferritin was 11.  Iron saturation was 8% and TIBC 362.  She was instructed to begin ferrous sulfate 325 mg daily and increase to BID if tolerated beginning 03/14/2018.  She has not started oral iron.  Symptomatically, she has a myriad a symptoms. She has headaches and weakness. Patient has extreme fatigue. She states notes that she is often "too tired to chew". Patient has exertional dyspnea.  Patient notes that she feels off balance. Patient has a past medical history significant for stress and anxiety. Patient states, "this is not stress. I have been seen by my psychiatrist".  She describes heavy menses in the past.  This has improved sine she started Depo Provera. She has been on Depo injections since 2012. She has not had a period until recently, however patient notes that "she didn't get it in the muscle", which has caused her to have light vaginal bleeding for the last 3 weeks. Patient is being seen by GYN this week.   She denies any hematuria, melena or  hematochezia.  She has never had a colonoscopy.  Patient eats well. She consumes meat 5 times a week. She eats green leafy vegetables on a daily basis. Patient has had ice pica for sometime. She states, "I don't scrape the freezer like I used to". Patient denies restless leg symptoms.   She has frequent headaches. She has been seen by her PCP and prescribe sumatriptan nasal spray. Patient has frequent loose stools. She has been tested in the past for IBS. She has urinary incontinence at night.  Patient complains of arthritic pain  In her hands.   Family history is notable for thalassemia.  Mother has pernicious anemia and receives iron infusions.  She denies any family history of colon cancer.   Past Medical History:  Diagnosis Date  . Allergy   . Anxiety   . Arrhythmia   . Arthritis   . Cervical dysplasia   . Cystitis   . Depression   . GERD (gastroesophageal reflux disease)   . Gross hematuria   . Heart murmur   . HLD (hyperlipidemia)   . Hypertension   . Hypothyroid   . Tobacco abuse     Past Surgical History:  Procedure Laterality Date  . CARPAL TUNNEL RELEASE Bilateral 2011  . FOOT SURGERY Right 2013   Plantar fascia  . TONSILLECTOMY  1979    Family History  Problem Relation Age of Onset  . Depression Mother   . Mental illness Mother   . Alcohol abuse Mother   . Cancer Maternal Grandmother   .  Diabetes Maternal Grandmother   . Kidney disease Maternal Grandfather   . Diabetes Father   . Mental illness Father   . Depression Father   . Drug abuse Father   . Depression Paternal Grandfather   . Drug abuse Paternal Grandfather   . Bladder Cancer Neg Hx     Social History:  reports that she has been smoking cigarettes.  She has a 30.00 pack-year smoking history. She uses smokeless tobacco. She reports that she does not drink alcohol or use drugs.  She smokes 1/2 pack/day x 30 years.  Patient denies known exposures to radiation on toxins. .The patient is alone  today.  Allergies:  Allergies  Allergen Reactions  . Bee Venom Anaphylaxis, Hives and Swelling    Carries Epi pen. Carries Epi pen.  . Cat Hair Extract Itching, Other (See Comments) and Swelling    Allergic to trees, nuts, wheat, grass, cats & dogs - itchy watery eyes, swelling. Uses Zyrtec & Flonase & Benadryl if really bad. Used to get allergy shots. Allergic to trees, nuts, wheat, grass, cats & dogs - itchy watery eyes, swelling. Uses Zyrtec & Flonase & Benadryl if really bad. Used to get allergy shots.  . Tetracycline     Other reaction(s): Unknown  . Tetracyclines & Related Nausea And Vomiting  . Lac Bovis Diarrhea and Nausea And Vomiting  . Lactose Diarrhea and Nausea And Vomiting  . Milk Protein Diarrhea and Nausea And Vomiting  . Tape Other (See Comments) and Rash    Needs to use paper tape. Breaks out with severe rash, pulls skin off when using adhesive.    Current Medications: Current Outpatient Medications  Medication Sig Dispense Refill  . atenolol (TENORMIN) 50 MG tablet Take 1 tablet (50 mg total) by mouth daily. 30 tablet 11  . baclofen (LIORESAL) 10 MG tablet Take 0.5-1 tablets (5-10 mg total) 3 (three) times daily as needed by mouth for muscle spasms. 30 each 1  . Cetirizine HCl (ZYRTEC ALLERGY) 10 MG CAPS     . EPINEPHrine (EPIPEN 2-PAK) 0.3 mg/0.3 mL IJ SOAJ injection Inject 0.3 mLs (0.3 mg total) into the muscle once. 1 Device 0  . fluticasone (FLONASE) 50 MCG/ACT nasal spray instill 2 sprays into each nostril once daily 16 g 5  . fluvoxaMINE (LUVOX) 100 MG tablet Take 150 mg by mouth 2 (two) times daily.    Marland Kitchen ipratropium (ATROVENT HFA) 17 MCG/ACT inhaler Inhale 2 puffs into the lungs every 6 (six) hours as needed for wheezing. 1 Inhaler 12  . ketoconazole (NIZORAL) 2 % shampoo apply topically as directed 120 mL 3  . lamoTRIgine (LAMICTAL) 200 MG tablet Take 200 mg by mouth daily.    Marland Kitchen levothyroxine (SYNTHROID, LEVOTHROID) 50 MCG tablet Take 1 tablet (50 mcg  total) by mouth every morning. 30 tablet 5  . medroxyPROGESTERone (DEPO-PROVERA) 150 MG/ML injection inject 1 milliliter every 3 months 1 mL 1  . pantoprazole (PROTONIX) 20 MG tablet Take 1 tablet (20 mg total) by mouth daily. 30 tablet 5  . QUEtiapine (SEROQUEL) 50 MG tablet Take 50 mg by mouth 3 (three) times daily.    . SUMAtriptan (IMITREX) 5 MG/ACT nasal spray Place 1 spray (5 mg total) every 2 (two) hours as needed into the nose for migraine. Max 3 doses in 24 hours 1 Inhaler 0  . VENTOLIN HFA 108 (90 Base) MCG/ACT inhaler inhale 1 to 2 puffs by mouth every 4 to 6 hours if needed for shortness of breath or  wheezing 18 g 1   No current facility-administered medications for this visit.     Review of Systems:  GENERAL:  Fatigued.  Sedentary.  No fevers or sweats.  No weight loss. PERFORMANCE STATUS (ECOG):  1-2. HEENT:  No visual changes, runny nose, sore throat, mouth sores or tenderness. Lungs: Exertional shortness of breath.  No cough.  No hemoptysis. Cardiac:  No chest pain, palpitations, orthopnea, or PND. GI:  Loose stools. No nausea, vomiting, diarrhea, constipation, melena or hematochezia. GU:  Vaginal bleeding; seeing GYN.  Incontinence.  No urgency, frequency, dysuria, or hematuria. Musculoskeletal:  Arthritic pain in hands. No back pain.  No other joint pain.  No muscle tenderness. Extremities:  No pain.  Retaining fluid. Skin:  No rashes or skin changes. Neuro:  Chronic headaches (off/on). Balance issues.  Poor memory.  No numbness or weakness, or coordination issues. Endocrine:  No diabetes.  Hypothyroid on Synthroid.  No hot flashes.  Sweats at night (perimenopausal). Psych:  Anxiety and depression.  Denies stress related issues. Pain:  No focal pain. Review of systems:  All other systems reviewed and found to be negative.  Physical Exam: Blood pressure 131/79, pulse 73, temperature 99.8 F (37.7 C), temperature source Tympanic, resp. rate 18, height 5' 6.75" (1.695 m),  weight 255 lb 5 oz (115.8 kg), SpO2 97 %. GENERAL:  Well developed, well nourished, woman sitting comfortably in the exam room in no acute distress. MENTAL STATUS:  Alert and oriented to person, place and time. HEAD:  Brown hair with blonde highlights.  Normocephalic, atraumatic, face symmetric, no Cushingoid features. EYES:  Blue eyes.  Pupils equal round and reactive to light and accomodation.  No conjunctivitis or scleral icterus. ENT:  Oropharynx clear without lesion.  Tongue normal. Mucous membranes moist.  RESPIRATORY:  Clear to auscultation without rales, wheezes or rhonchi. CARDIOVASCULAR:  Regular rate and rhythm without murmur, rub or gallop. ABDOMEN:  Soft, non-tender, with active bowel sounds, and no appreciable hepatosplenomegaly.  No masses. SKIN:  Tattoos.  No rashes, ulcers or lesions. EXTREMITIES: No edema, no skin discoloration or tenderness.  No palpable cords. LYMPH NODES: No palpable cervical, supraclavicular, axillary or inguinal adenopathy  NEUROLOGICAL: Unremarkable. PSYCH:  Appropriate.  Tearful at times.   No visits with results within 3 Day(s) from this visit.  Latest known visit with results is:  Appointment on 03/12/2018  Component Date Value Ref Range Status  . Hgb A1c MFr Bld 03/12/2018 6.1* <5.7 % of total Hgb Final   Comment: For someone without known diabetes, a hemoglobin  A1c value between 5.7% and 6.4% is consistent with prediabetes and should be confirmed with a  follow-up test. . For someone with known diabetes, a value <7% indicates that their diabetes is well controlled. A1c targets should be individualized based on duration of diabetes, age, comorbid conditions, and other considerations. . This assay result is consistent with an increased risk of diabetes. . Currently, no consensus exists regarding use of hemoglobin A1c for diagnosis of diabetes for children. .   . Mean Plasma Glucose 03/12/2018 128  (calc) Final  . eAG (mmol/L)  03/12/2018 7.1  (calc) Final  . WBC 03/12/2018 9.8  3.8 - 10.8 Thousand/uL Final  . RBC 03/12/2018 3.97  3.80 - 5.10 Million/uL Final  . Hemoglobin 03/12/2018 11.2* 11.7 - 15.5 g/dL Final  . HCT 03/12/2018 32.7* 35.0 - 45.0 % Final  . MCV 03/12/2018 82.4  80.0 - 100.0 fL Final  . MCH 03/12/2018 28.2  27.0 -  33.0 pg Final  . MCHC 03/12/2018 34.3  32.0 - 36.0 g/dL Final  . RDW 03/12/2018 14.1  11.0 - 15.0 % Final  . Platelets 03/12/2018 289  140 - 400 Thousand/uL Final  . MPV 03/12/2018 10.3  7.5 - 12.5 fL Final  . Neutro Abs 03/12/2018 6,115  1,500 - 7,800 cells/uL Final  . Lymphs Abs 03/12/2018 2,969  850 - 3,900 cells/uL Final  . WBC mixed population 03/12/2018 510  200 - 950 cells/uL Final  . Eosinophils Absolute 03/12/2018 176  15 - 500 cells/uL Final  . Basophils Absolute 03/12/2018 29  0 - 200 cells/uL Final  . Neutrophils Relative % 03/12/2018 62.4  % Final  . Total Lymphocyte 03/12/2018 30.3  % Final  . Monocytes Relative 03/12/2018 5.2  % Final  . Eosinophils Relative 03/12/2018 1.8  % Final  . Basophils Relative 03/12/2018 0.3  % Final  . Glucose, Bld 03/12/2018 115* 65 - 99 mg/dL Final   Comment: .            Fasting reference interval . For someone without known diabetes, a glucose value between 100 and 125 mg/dL is consistent with prediabetes and should be confirmed with a follow-up test. .   . BUN 03/12/2018 9  7 - 25 mg/dL Final  . Creat 03/12/2018 0.74  0.50 - 1.10 mg/dL Final  . GFR, Est Non African American 03/12/2018 98  > OR = 60 mL/min/1.41m Final  . GFR, Est African American 03/12/2018 113  > OR = 60 mL/min/1.761mFinal  . BUN/Creatinine Ratio 0376/22/6333OT APPLICABLE  6 - 22 (calc) Final  . Sodium 03/12/2018 142  135 - 146 mmol/L Final  . Potassium 03/12/2018 3.9  3.5 - 5.3 mmol/L Final  . Chloride 03/12/2018 109  98 - 110 mmol/L Final  . CO2 03/12/2018 28  20 - 32 mmol/L Final  . Calcium 03/12/2018 9.3  8.6 - 10.2 mg/dL Final  . Total Protein  03/12/2018 6.3  6.1 - 8.1 g/dL Final  . Albumin 03/12/2018 4.0  3.6 - 5.1 g/dL Final  . Globulin 03/12/2018 2.3  1.9 - 3.7 g/dL (calc) Final  . AG Ratio 03/12/2018 1.7  1.0 - 2.5 (calc) Final  . Total Bilirubin 03/12/2018 0.3  0.2 - 1.2 mg/dL Final  . Alkaline phosphatase (APISO) 03/12/2018 80  33 - 115 U/L Final  . AST 03/12/2018 12  10 - 35 U/L Final  . ALT 03/12/2018 10  6 - 29 U/L Final  . Cholesterol 03/12/2018 172  <200 mg/dL Final  . HDL 03/12/2018 36* >50 mg/dL Final  . Triglycerides 03/12/2018 129  <150 mg/dL Final  . LDL Cholesterol (Calc) 03/12/2018 112* mg/dL (calc) Final   Comment: Reference range: <100 . Desirable range <100 mg/dL for primary prevention;   <70 mg/dL for patients with CHD or diabetic patients  with > or = 2 CHD risk factors. . Marland KitchenDL-C is now calculated using the Martin-Hopkins  calculation, which is a validated novel method providing  better accuracy than the Friedewald equation in the  estimation of LDL-C.  MaCresenciano Genret al. JAAnnamaria [id number]  (http://education.[facility].com/faq/FAQ164)   . Total CHOL/HDL Ratio 03/12/2018 4.8  <5.0 (calc) Final  . Non-HDL Cholesterol (Calc) 03/12/2018 136* <130 mg/dL (calc) Final   Comment: For patients with diabetes plus 1 major ASCVD risk  factor, treating to a non-HDL-C goal of <100 mg/dL  (LDL-C of <70 mg/dL) is considered a therapeutic  option.   . TSH 03/12/2018 3.04  mIU/L Final   Comment:           Reference Range .           > or = 20 Years  0.40-4.50 .                Pregnancy Ranges           First trimester    0.26-2.66           Second trimester   0.55-2.73           Third trimester    0.43-2.91   . Free T4 03/12/2018 1.1  0.8 - 1.8 ng/dL Final  . Iron 03/12/2018 30* 40 - 190 mcg/dL Final  . TIBC 03/12/2018 362  250 - 450 mcg/dL (calc) Final  . %SAT 03/12/2018 8* 11 - 50 % (calc) Final  . Ferritin 03/12/2018 11  10 - 232 ng/mL Final  . TEST NAME: 03/12/2018 IRON, TIBC AND  FERRITIN PANEL   Final  . TEST CODE: 03/12/2018 [id number]   Final  . CLIENT CONTACT: 03/12/2018 [facility]   Final  . REPORT ALWAYS MESSAGE SIGNATURE 03/12/2018    Final   Comment: . The laboratory testing on this patient was verbally requested or confirmed by the ordering physician or his or her authorized representative after contact with an employee of [employer]. Federal regulations require that we maintain on file written authorization for all laboratory testing.  Accordingly we are asking that the ordering physician or his or her authorized representative sign a copy of this report and promptly return it to the client service representative. . . Signature:____________________________________________________ . Please fax this signed page to [phone] or return it via your [employer] courier.     Assessment:  Deniese Oberry is a 47 y.o. female with a normocytic anemia.  She has iron deficiency.  Hemoglobin was normal until 03/12/2018.  She notes scant vaginal bleeding on DepoProvera.  She denies any hematuria, melena or hematochezia.  Diet appears good.  She has ice pica.  CBC on 03/12/2018 revealed a hematocrit of 32.7, hemoglobin 11.2, and MCV 82.4.  Normal studies included:  Creatinine, LFTs, TSH and free T4.  Ferritin was 11.  Iron saturation was 8% and TIBC 362.  Ferritin has been followed: 8 on 04/28/2012, 20 on 09/03/2012, 18 on 09/28/2013, and 11 on 03/12/2018.  Symptomatically, she is fatigued.  Exam is unremarkable.  Plan: 1.  Discuss mild normocytic anemia.  She has iron deficiency documented by a very low ferritin and low iron saturation.  She has ice pica.  The etiology is unclear.  Vaginal bleeding appears minimal.  Discuss checking urinalysis after menses resolved.  Discuss guaiac cards.  RBC indices normal.  Suspect additional etiologies. 2.  Dicuss initiation of oral iron supplement with source of vitamin C. Discussed escalating dose  of ferrous sulfate 325 mg up to TID based on tolerance. Discuss potential need for intravenous iron in the future. Patient requested Rx to be sent in. Rx for ferrous sulfate 325 mg sent (Disp #90).  3.  Labs today: CBC with diff, B12, folate, retic. 4.  Guaiac cards x 3. 5.  Preauthorize Venofer if needed.  6.  RTC in 1-2 weeks for MD assessment, labs (CBC with diff, retic), +/- Venofer.    Honor Loh, NP  03/24/2018, 2:17 PM   I saw and evaluated the patient, participating in the key portions of the service and reviewing pertinent diagnostic studies and records.  I  reviewed the nurse practitioner's note and agree with the findings and the plan.  The assessment and plan were discussed with the patient.  Multiple questions were asked by the patient and answered.   Nolon Stalls, MD 03/24/2018, 2:17 PM

## 2018-03-24 NOTE — Progress Notes (Signed)
Chief complaint: 1.  Abnormal uterine bleeding 2.  Vasomotor symptoms 3.  Medication management  Amanda Webb presents today for evaluation of abnormal uterine bleeding.  Review of menstrual calendar monitoring shows that she has been spotting every day for the past 6 weeks, although it did stop last night and has not had any recurrence today.  She denies any significant pelvic pain.  She denies any change in bowel or bladder function.  She has gotten at least 3 Depo-Provera injections over the past year and is due for her next injection on 04/02/2018.  Patient reports having long history of vasomotor symptoms.  [place] testing from 6 months ago was normal without evidence of menopause.  TSH from 03/12/2018 was normal.  The patient does have chronic fatigue.  She has been evaluated by hematology. CBC on 03/12/2018 revealed a hematocrit of 32.7, hemoglobin 11.2, and MCV 82.4.  Normal studies included:  Creatinine, LFTs, TSH and free T4.  Ferritin was 11.  Iron saturation was 8% and TIBC 362. Ferritin has been followed: 8 on 04/28/2012, 20 on 09/03/2012, 18 on 09/28/2013, and 11 on 03/12/2018. She is on 3 times a day iron supplementation and is scheduled for an iron infusion next week.  Past medical history, past surgical history, problem list, medications, and allergies are reviewed  OBJECTIVE: BP 111/75   Pulse 69   Ht 5' 6.75" (1.695 m)   Wt 257 lb 4.8 oz (116.7 kg)   LMP 03/22/2018 (Exact Date)   BMI 40.60 kg/m  Pleasant female in no acute distress.  Alert and oriented.  Affect is appropriate. Abdomen: Soft, nontender without organomegaly Exam: External genitalia-normal BUS-normal Vagina-normal estrogen effect; no discharge; no abnormal lesions Cervix-no discharge; no cervical motion tenderness; no lesions Uterus-midplane to retroverted, irregular in contour and slightly tender 1/4, top normal size Adnexa-nonpalpable nontender Rectovaginal-normal external exam  ASSESSMENT: 1.  Chronic  abnormal uterine bleeding on Depo-Provera; pelvic ultrasound from April 2018 revealed no uterine abnormalities other than punctate echogenic foci within the fundal endometrium 2.  Chronic fatigue with mild anemia, INR supplementation and soon to get iron infusion 3.  No evidence of abnormal uterine bleeding today  PLAN: 1.  GC/CT DNA probe to rule out infection as a source of abnormal uterine bleeding 2.  Depo-Provera 150 mg IM today (slightly early) 3.  Begin estradiol 1 mg daily and then attempts to regulate abnormal uterine bleeding 4.  Return in 3 months for annual exam, next Depo-Provera injection, and follow-up on bleeding.  A total of 15 minutes were spent face-to-face with the patient during this encounter and over half of that time dealt with counseling and coordination of care.  Brayton Mars, MD  Note: This dictation was prepared with Dragon dictation along with smaller phrase technology. Any transcriptional errors that result from this process are unintentional.

## 2018-03-25 ENCOUNTER — Encounter: Payer: Self-pay | Admitting: Hematology and Oncology

## 2018-03-25 DIAGNOSIS — D649 Anemia, unspecified: Secondary | ICD-10-CM | POA: Diagnosis not present

## 2018-03-25 DIAGNOSIS — R5383 Other fatigue: Secondary | ICD-10-CM | POA: Diagnosis not present

## 2018-03-25 DIAGNOSIS — N939 Abnormal uterine and vaginal bleeding, unspecified: Secondary | ICD-10-CM | POA: Diagnosis not present

## 2018-03-25 DIAGNOSIS — E611 Iron deficiency: Secondary | ICD-10-CM | POA: Diagnosis not present

## 2018-03-26 ENCOUNTER — Encounter: Payer: Self-pay | Admitting: Obstetrics and Gynecology

## 2018-03-26 ENCOUNTER — Ambulatory Visit (INDEPENDENT_AMBULATORY_CARE_PROVIDER_SITE_OTHER): Payer: Medicare Other | Admitting: Obstetrics and Gynecology

## 2018-03-26 VITALS — BP 111/75 | HR 69 | Ht 66.75 in | Wt 257.3 lb

## 2018-03-26 DIAGNOSIS — Z720 Tobacco use: Secondary | ICD-10-CM

## 2018-03-26 DIAGNOSIS — Z8041 Family history of malignant neoplasm of ovary: Secondary | ICD-10-CM | POA: Diagnosis not present

## 2018-03-26 DIAGNOSIS — E039 Hypothyroidism, unspecified: Secondary | ICD-10-CM

## 2018-03-26 DIAGNOSIS — N939 Abnormal uterine and vaginal bleeding, unspecified: Secondary | ICD-10-CM

## 2018-03-26 DIAGNOSIS — Z1231 Encounter for screening mammogram for malignant neoplasm of breast: Secondary | ICD-10-CM

## 2018-03-26 DIAGNOSIS — D649 Anemia, unspecified: Secondary | ICD-10-CM | POA: Diagnosis not present

## 2018-03-26 DIAGNOSIS — R5383 Other fatigue: Secondary | ICD-10-CM | POA: Diagnosis not present

## 2018-03-26 DIAGNOSIS — Z1239 Encounter for other screening for malignant neoplasm of breast: Secondary | ICD-10-CM

## 2018-03-26 DIAGNOSIS — E611 Iron deficiency: Secondary | ICD-10-CM | POA: Diagnosis not present

## 2018-03-26 MED ORDER — ESTRADIOL 1 MG PO TABS: 1.0000 mg | ORAL_TABLET | Freq: Every day | ORAL | 1 refills | Status: DC

## 2018-03-26 MED ORDER — MEDROXYPROGESTERONE ACETATE 150 MG/ML IM SUSP: 150.0000 mg | INTRAMUSCULAR | 2 refills | Status: DC

## 2018-03-26 NOTE — Patient Instructions (Signed)
1.  Depo-Provera 150 mg IM is to be given today 2.  Begin estradiol 1 mg a day for 30 days to help suppress abnormal uterine bleeding 3.  GC/CT DNA probe is performed to rule out infection as a source of abnormal uterine bleeding 4.  Continue monitoring for abnormal uterine bleeding 5.  Return in 3 months for repeat Depo-Provera and annual exam

## 2018-03-27 ENCOUNTER — Encounter: Payer: Self-pay | Admitting: Obstetrics and Gynecology

## 2018-03-27 DIAGNOSIS — R5383 Other fatigue: Secondary | ICD-10-CM | POA: Diagnosis not present

## 2018-03-27 DIAGNOSIS — N939 Abnormal uterine and vaginal bleeding, unspecified: Secondary | ICD-10-CM | POA: Diagnosis not present

## 2018-03-27 DIAGNOSIS — E611 Iron deficiency: Secondary | ICD-10-CM | POA: Diagnosis not present

## 2018-03-27 DIAGNOSIS — D649 Anemia, unspecified: Secondary | ICD-10-CM | POA: Diagnosis not present

## 2018-03-28 ENCOUNTER — Other Ambulatory Visit: Payer: Self-pay

## 2018-03-28 ENCOUNTER — Ambulatory Visit (INDEPENDENT_AMBULATORY_CARE_PROVIDER_SITE_OTHER): Payer: Medicare Other | Admitting: Obstetrics and Gynecology

## 2018-03-28 VITALS — BP 121/78 | HR 89 | Ht 66.5 in | Wt 256.2 lb

## 2018-03-28 DIAGNOSIS — Z3042 Encounter for surveillance of injectable contraceptive: Secondary | ICD-10-CM | POA: Diagnosis not present

## 2018-03-28 DIAGNOSIS — D509 Iron deficiency anemia, unspecified: Secondary | ICD-10-CM

## 2018-03-28 LAB — OCCULT BLOOD X 1 CARD TO LAB, STOOL
Fecal Occult Bld: NEGATIVE
Fecal Occult Bld: NEGATIVE
Fecal Occult Bld: NEGATIVE

## 2018-03-28 LAB — GC/CHLAMYDIA PROBE AMP
Chlamydia trachomatis, NAA: NEGATIVE
Neisseria gonorrhoeae by PCR: NEGATIVE

## 2018-03-28 MED ORDER — MEDROXYPROGESTERONE ACETATE 150 MG/ML IM SUSP
150.0000 mg | Freq: Once | INTRAMUSCULAR | Status: AC
  Administered 2018-03-28: 150 mg via INTRAMUSCULAR

## 2018-03-28 NOTE — Progress Notes (Signed)
  Date last pap: 04/02/17. Last Depo-Provera: 01/17/18. Side Effects if any: none. Serum HCG indicated? NA. Depo-Provera 150 mg IM given by: FH. Next appointment due June 28-June 27, 2018.Marland Kitchen   BP 121/78   Pulse 89   Ht 5' 6.5" (1.689 m)   Wt 256 lb 3.2 oz (116.2 kg)   LMP 03/22/2018 (Exact Date)   BMI 40.73 kg/m    I have reviewed the record and concur with patient management and plan. DEFRANCESCO, Hassell Done, MD, Cherlynn June

## 2018-04-01 ENCOUNTER — Encounter: Payer: Self-pay | Admitting: Hematology and Oncology

## 2018-04-01 ENCOUNTER — Other Ambulatory Visit: Payer: Self-pay | Admitting: Obstetrics and Gynecology

## 2018-04-03 ENCOUNTER — Encounter: Payer: Self-pay | Admitting: Hematology and Oncology

## 2018-04-07 ENCOUNTER — Other Ambulatory Visit: Payer: Self-pay

## 2018-04-07 ENCOUNTER — Inpatient Hospital Stay: Payer: Medicare Other

## 2018-04-07 DIAGNOSIS — E611 Iron deficiency: Secondary | ICD-10-CM | POA: Diagnosis not present

## 2018-04-07 DIAGNOSIS — R5383 Other fatigue: Secondary | ICD-10-CM | POA: Diagnosis not present

## 2018-04-07 DIAGNOSIS — N939 Abnormal uterine and vaginal bleeding, unspecified: Secondary | ICD-10-CM | POA: Diagnosis not present

## 2018-04-07 DIAGNOSIS — D649 Anemia, unspecified: Secondary | ICD-10-CM | POA: Diagnosis not present

## 2018-04-07 DIAGNOSIS — D509 Iron deficiency anemia, unspecified: Secondary | ICD-10-CM

## 2018-04-07 LAB — CBC WITH DIFFERENTIAL/PLATELET
Basophils Absolute: 0 10*3/uL (ref 0–0.1)
Basophils Relative: 1 %
Eosinophils Absolute: 0.1 10*3/uL (ref 0–0.7)
Eosinophils Relative: 1 %
HCT: 39 % (ref 35.0–47.0)
Hemoglobin: 13.2 g/dL (ref 12.0–16.0)
Lymphocytes Relative: 23 %
Lymphs Abs: 2.3 10*3/uL (ref 1.0–3.6)
MCH: 28.7 pg (ref 26.0–34.0)
MCHC: 33.8 g/dL (ref 32.0–36.0)
MCV: 84.9 fL (ref 80.0–100.0)
Monocytes Absolute: 0.5 10*3/uL (ref 0.2–0.9)
Monocytes Relative: 5 %
Neutro Abs: 7 10*3/uL — ABNORMAL HIGH (ref 1.4–6.5)
Neutrophils Relative %: 70 %
Platelets: 281 10*3/uL (ref 150–440)
RBC: 4.59 MIL/uL (ref 3.80–5.20)
RDW: 16.1 % — ABNORMAL HIGH (ref 11.5–14.5)
WBC: 10 10*3/uL (ref 3.6–11.0)

## 2018-04-07 LAB — RETICULOCYTES
RBC.: 4.68 MIL/uL (ref 3.80–5.20)
Retic Count, Absolute: 70.2 10*3/uL (ref 19.0–183.0)
Retic Ct Pct: 1.5 % (ref 0.4–3.1)

## 2018-04-08 ENCOUNTER — Inpatient Hospital Stay: Payer: Medicare Other

## 2018-04-08 ENCOUNTER — Encounter: Payer: Self-pay | Admitting: Hematology and Oncology

## 2018-04-08 ENCOUNTER — Other Ambulatory Visit: Payer: Self-pay

## 2018-04-08 ENCOUNTER — Inpatient Hospital Stay (HOSPITAL_BASED_OUTPATIENT_CLINIC_OR_DEPARTMENT_OTHER): Payer: Medicare Other | Admitting: Hematology and Oncology

## 2018-04-08 ENCOUNTER — Encounter: Payer: Medicare Other | Admitting: Obstetrics and Gynecology

## 2018-04-08 VITALS — BP 132/84 | HR 89 | Temp 99.0°F | Resp 18 | Wt 252.7 lb

## 2018-04-08 DIAGNOSIS — E611 Iron deficiency: Secondary | ICD-10-CM

## 2018-04-08 DIAGNOSIS — D509 Iron deficiency anemia, unspecified: Secondary | ICD-10-CM

## 2018-04-08 DIAGNOSIS — D649 Anemia, unspecified: Secondary | ICD-10-CM

## 2018-04-08 DIAGNOSIS — E538 Deficiency of other specified B group vitamins: Secondary | ICD-10-CM | POA: Insufficient documentation

## 2018-04-08 DIAGNOSIS — N939 Abnormal uterine and vaginal bleeding, unspecified: Secondary | ICD-10-CM | POA: Diagnosis not present

## 2018-04-08 DIAGNOSIS — R5383 Other fatigue: Secondary | ICD-10-CM

## 2018-04-08 LAB — URINALYSIS, COMPLETE (UACMP) WITH MICROSCOPIC
Bacteria, UA: NONE SEEN
Bilirubin Urine: NEGATIVE
Glucose, UA: NEGATIVE mg/dL
Ketones, ur: NEGATIVE mg/dL
Leukocytes, UA: NEGATIVE
Nitrite: NEGATIVE
Protein, ur: NEGATIVE mg/dL
Specific Gravity, Urine: 1.021 (ref 1.005–1.030)
pH: 6 (ref 5.0–8.0)

## 2018-04-08 NOTE — Progress Notes (Signed)
[facility] day:  04/08/2018  Chief Complaint: Amanda Webb is a 47 y.o. female with low iron who is seen for review of work-up and discussion regarding direction of therapy.  HPI:  The patient was last seen in the hematology clinic on 03/24/2018 for initial consultation.  She had a mild normocytic anemia.  She had iron deficiency documented by a very low ferritin (11) and low iron saturation (8%).  She had ice pica.  The etiology was unclear.  Vaginal bleeding appeared minimal.  We discussed oral iron with vitamin C.  We discussed checking urinalysis after menses resolved.  We diiscussed guaiac cards.  RBC indices were normal.  Additional etiologies were suspected.  She underwent a work-up.  CBC revealed a hematocrit of 35.7, hemoglobin 11.9, and MCV 84.2.  Folate was 17.2.  B12 was 296 (low normal).  Retic was 1.3%.  Guaiac cards were negative x 3.  CBC on 04/07/2018 revealed a hematocrit of 39.0, hemoglobin 13.2, and MCV 84.9.  Retic was 1.5%.  Symptomatically, patient is exhausted. Patient is under a great deal of stress. There is relationship strain between the patient and her mother. She notes that she is caring for her 59 year old grandson. Patient is seeing a therapist and is "on medication".  In the interim, her vaginal bleeding has resolved. Patient has received a Depo injection. Patient denies bleeding; no hematochezia, melena, or gross hematuria. Patient is taking oral iron BID at this point.  She continues to complain of ice pica.  She is eating well. Weight is down 4 pounds.    Past Medical History:  Diagnosis Date  . Allergy   . Anemia   . Anxiety   . Arrhythmia   . Arthritis   . Cervical dysplasia   . Cystitis   . Depression   . GERD (gastroesophageal reflux disease)   . Gross hematuria   . Heart murmur   . HLD (hyperlipidemia)   . Hypertension   . Hypothyroid   . Tobacco abuse     Past Surgical History:   Procedure Laterality Date  . CARPAL TUNNEL RELEASE Bilateral 2011  . FOOT SURGERY Right 2013   Plantar fascia  . TONSILLECTOMY  1979    Family History  Problem Relation Age of Onset  . Depression Mother   . Mental illness Mother   . Alcohol abuse Mother   . Cancer Maternal Grandmother   . Diabetes Maternal Grandmother   . Kidney disease Maternal Grandfather   . Diabetes Father   . Mental illness Father   . Depression Father   . Drug abuse Father   . Depression Paternal Grandfather   . Drug abuse Paternal Grandfather   . Bladder Cancer Neg Hx     Social History:  reports that she has been smoking cigarettes.  She has a 30.00 pack-year smoking history. She uses smokeless tobacco. She reports that she does not drink alcohol or use drugs.  She smokes 1/2 pack/day x 30 years.  Patient denies known exposures to radiation on toxins.  She is caring for her 26 year old grandson.  The patient is alone today.  Allergies:  Allergies  Allergen Reactions  . Bee Venom Anaphylaxis, Hives and Swelling    Carries Epi pen. Carries Epi pen.  . Cat Hair Extract Itching, Other (See Comments) and Swelling    Allergic to trees, nuts, wheat, grass, cats & dogs - itchy watery eyes, swelling. Uses Zyrtec &  Flonase & Benadryl if really bad. Used to get allergy shots. Allergic to trees, nuts, wheat, grass, cats & dogs - itchy watery eyes, swelling. Uses Zyrtec & Flonase & Benadryl if really bad. Used to get allergy shots.  . Tetracycline     Other reaction(s): Unknown  . Tetracyclines & Related Nausea And Vomiting  . Lac Bovis Diarrhea and Nausea And Vomiting  . Lactose Diarrhea and Nausea And Vomiting  . Milk Protein Diarrhea and Nausea And Vomiting  . Tape Other (See Comments) and Rash    Needs to use paper tape. Breaks out with severe rash, pulls skin off when using adhesive.    Current Medications: Current Outpatient Medications  Medication Sig Dispense Refill  . atenolol (TENORMIN) 50 MG  tablet Take 1 tablet (50 mg total) by mouth daily. 30 tablet 11  . Cetirizine HCl (ZYRTEC ALLERGY) 10 MG CAPS     . EPINEPHrine (EPIPEN 2-PAK) 0.3 mg/0.3 mL IJ SOAJ injection Inject 0.3 mLs (0.3 mg total) into the muscle once. 1 Device 0  . ferrous sulfate 325 (65 FE) MG EC tablet 1 tablet (325mg ) daily. Slowly advance to TID over the next few weeks. TAKE WITH SOURCE OF VITAMIN C. 90 tablet 0  . fluticasone (FLONASE) 50 MCG/ACT nasal spray instill 2 sprays into each nostril once daily 16 g 5  . fluvoxaMINE (LUVOX) 100 MG tablet Take 150 mg by mouth 2 (two) times daily.    Marland Kitchen ipratropium (ATROVENT HFA) 17 MCG/ACT inhaler Inhale 2 puffs into the lungs every 6 (six) hours as needed for wheezing. 1 Inhaler 12  . ketoconazole (NIZORAL) 2 % shampoo apply topically as directed 120 mL 3  . lamoTRIgine (LAMICTAL) 200 MG tablet Take 200 mg by mouth daily.    Marland Kitchen levothyroxine (SYNTHROID, LEVOTHROID) 50 MCG tablet Take 1 tablet (50 mcg total) by mouth every morning. 30 tablet 5  . medroxyPROGESTERone (DEPO-PROVERA) 150 MG/ML injection Inject 1 mL (150 mg total) into the muscle every 3 (three) months. 1 mL 2  . pantoprazole (PROTONIX) 20 MG tablet Take 1 tablet (20 mg total) by mouth daily. 30 tablet 5  . QUEtiapine (SEROQUEL) 50 MG tablet Take 50 mg by mouth 3 (three) times daily.    . SUMAtriptan (IMITREX) 5 MG/ACT nasal spray Place 1 spray (5 mg total) every 2 (two) hours as needed into the nose for migraine. Max 3 doses in 24 hours 1 Inhaler 0  . VENTOLIN HFA 108 (90 Base) MCG/ACT inhaler inhale 1 to 2 puffs by mouth every 4 to 6 hours if needed for shortness of breath or wheezing 18 g 1   No current facility-administered medications for this visit.     Review of Systems:  GENERAL:  Exhausted.  No fevers, sweats.  Weight loss of 4 pounds. PERFORMANCE STATUS (ECOG):  1-2 HEENT:  No visual changes, runny nose, sore throat, mouth sores or tenderness. Lungs:  Shortness of breath on exertion. No cough.  No  hemoptysis. Cardiac:  No chest pain, palpitations, orthopnea, or PND. GI:  Loose stools.  No nausea, vomiting, diarrhea, constipation, melena or hematochezia. GU:  Incontinence.  No urgency, frequency, dysuria, or hematuria.  Vaginal bleeding resolved. Musculoskeletal:  No back pain.  Arthritis in hands.  No muscle tenderness. Extremities:  No pain or swelling. Skin:  No rashes or skin changes. Neuro:  Chronic headaches.  Poor memory.  No numbness or weakness, balance or coordination issues. Endocrine:  No diabetes.  Hypothyroid on Synthroid.  No hot flashes  or night sweats. Psych:  No mood changes, depression or anxiety. Stress. Pain:  No focal pain. Review of systems:  All other systems reviewed and found to be negative.   Physical Exam: Blood pressure 132/84, pulse 89, temperature 99 F (37.2 C), temperature source Oral, resp. rate 18, weight 252 lb 11.2 oz (114.6 kg), last menstrual period 03/22/2018, SpO2 98 %. GENERAL:  Well developed, well nourished, woman sitting comfortably in the exam room in no acute distress. MENTAL STATUS:  Alert and oriented to person, place and time. HEAD:  Brown hair with blonde highlights.  Normocephalic, atraumatic, face symmetric, no Cushingoid features. EYES:  Blue eyes.  No conjunctivitis or scleral icterus. NEUROLOGICAL: Unremarkable. PSYCH:  Appropriate.    Orders Only on 04/07/2018  Component Date Value Ref Range Status  . WBC 04/07/2018 10.0  3.6 - 11.0 K/uL Final  . RBC 04/07/2018 4.59  3.80 - 5.20 MIL/uL Final  . Hemoglobin 04/07/2018 13.2  12.0 - 16.0 g/dL Final  . HCT 04/07/2018 39.0  35.0 - 47.0 % Final  . MCV 04/07/2018 84.9  80.0 - 100.0 fL Final  . MCH 04/07/2018 28.7  26.0 - 34.0 pg Final  . MCHC 04/07/2018 33.8  32.0 - 36.0 g/dL Final  . RDW 04/07/2018 16.1* 11.5 - 14.5 % Final  . Platelets 04/07/2018 281  150 - 440 K/uL Final  . Neutrophils Relative % 04/07/2018 70  % Final  . Neutro Abs 04/07/2018 7.0* 1.4 - 6.5 K/uL Final  .  Lymphocytes Relative 04/07/2018 23  % Final  . Lymphs Abs 04/07/2018 2.3  1.0 - 3.6 K/uL Final  . Monocytes Relative 04/07/2018 5  % Final  . Monocytes Absolute 04/07/2018 0.5  0.2 - 0.9 K/uL Final  . Eosinophils Relative 04/07/2018 1  % Final  . Eosinophils Absolute 04/07/2018 0.1  0 - 0.7 K/uL Final  . Basophils Relative 04/07/2018 1  % Final  . Basophils Absolute 04/07/2018 0.0  0 - 0.1 K/uL Final   Performed at [facility], [address], [place], [place] [address]  . Retic Ct Pct 04/07/2018 1.5  0.4 - 3.1 % Final  . RBC. 04/07/2018 4.68  3.80 - 5.20 MIL/uL Final  . Retic Count, Absolute 04/07/2018 70.2  19.0 - 183.0 K/uL Final   Performed at [facility], [address], [place], [place] [address]    Assessment:  Elgie Landino is a 47 y.o. female with a mild normocytic anemia.  She has iron deficiency.  Hemoglobin was normal until 03/12/2018.  She notes scant vaginal bleeding on DepoProvera.  She denies any hematuria, melena or hematochezia.  Diet appears good.  She has ice pica.  CBC on 03/12/2018 revealed a hematocrit of 32.7, hemoglobin 11.2, and MCV 82.4.  Normal studies included:  Creatinine, LFTs, TSH and free T4.  Ferritin was 11.  Iron saturation was 8% and TIBC 362.  Work-up on 03/24/2018 revealed a hematocrit of 35.7, hemoglobin 11.9, and MCV 84.2.  Folate was 17.2.  B12 was 296 (low normal).  Retic was 1.3%.  Guaiac cards were negative x 3.  Ferritin has been followed: 8 on 04/28/2012, 20 on 09/03/2012, 18 on 09/28/2013, and 11 on 03/12/2018.  She is on oral iron.  Symptomatically, she remains fatigued.  Exam is unremarkable.  Plan: 1.  Labs today: MMA, UA   2.  Discuss initial work-up. B12 low normal and possible B12 deficiency. Will check MMA. 3.  Discuss improvement in hemoglobin after institution of oral iron.  Continue oral iron BID with vitamin C to replete iron stores (ferritin goal 100). 4.  Explore secondary causes of fatigue.  Differential includes OSAH syndrome as patient is fatigued, has EDS symptoms, frequent headaches, and she snores. Patient does not feel rested after a full night of rest. Offered to call PCP to discuss need for PSG to formally diagnose OSAH, however patient states, "I will look into that later".  5.  RTC in 2 months for MD assessment, labs (CBC with diff, retic, ferritin - day before), +/- Venofer.    Honor Loh, NP  04/08/2018, 11:40 AM   I saw and evaluated the patient, participating in the key portions of the service and reviewing pertinent diagnostic studies and records.  I reviewed the nurse practitioner's note and agree with the findings and the plan.  The assessment and plan were discussed with the patient.  Several questions were asked by the patient and answered.   Nolon Stalls, MD 04/08/2018, 11:40 AM

## 2018-04-08 NOTE — Progress Notes (Signed)
Patient here for follow-up. Offers no complaints.  °

## 2018-04-10 ENCOUNTER — Encounter: Payer: Self-pay | Admitting: Family Medicine

## 2018-04-10 ENCOUNTER — Encounter: Payer: Self-pay | Admitting: Hematology and Oncology

## 2018-04-10 DIAGNOSIS — Z91030 Bee allergy status: Secondary | ICD-10-CM

## 2018-04-10 MED ORDER — EPINEPHRINE 0.3 MG/0.3ML IJ SOAJ: 0.3000 mg | Freq: Once | INTRAMUSCULAR | 0 refills | Status: AC

## 2018-04-11 LAB — METHYLMALONIC ACID, SERUM: Methylmalonic Acid, Quantitative: 258 nmol/L (ref 0–378)

## 2018-04-16 ENCOUNTER — Other Ambulatory Visit: Payer: Self-pay | Admitting: Family Medicine

## 2018-04-16 DIAGNOSIS — I10 Essential (primary) hypertension: Secondary | ICD-10-CM

## 2018-04-16 MED ORDER — ATENOLOL 50 MG PO TABS: 50.0000 mg | ORAL_TABLET | Freq: Every day | ORAL | 11 refills | Status: DC

## 2018-04-18 ENCOUNTER — Telehealth: Payer: Self-pay | Admitting: Urgent Care

## 2018-04-18 NOTE — Telephone Encounter (Signed)
No. You can field the call. She had her ferritin checked a month ago. It was low. It is not time to check it. She was started on daily oral iron supplementation. Her CBC demonstrated improvement after 1 week. She is not anemic. The size and color of her red cells is normal. She should continue on the oral iron as prescribed. Labs will be rechecked as already scheduled.

## 2018-05-06 ENCOUNTER — Other Ambulatory Visit: Payer: Self-pay

## 2018-05-06 DIAGNOSIS — K219 Gastro-esophageal reflux disease without esophagitis: Secondary | ICD-10-CM

## 2018-05-06 MED ORDER — PANTOPRAZOLE SODIUM 20 MG PO TBEC: 20.0000 mg | DELAYED_RELEASE_TABLET | Freq: Every day | ORAL | 1 refills | Status: DC

## 2018-05-08 DIAGNOSIS — F4310 Post-traumatic stress disorder, unspecified: Secondary | ICD-10-CM | POA: Diagnosis not present

## 2018-05-31 ENCOUNTER — Encounter: Payer: Self-pay | Admitting: Hematology and Oncology

## 2018-06-01 ENCOUNTER — Other Ambulatory Visit: Payer: Self-pay | Admitting: Urgent Care

## 2018-06-01 ENCOUNTER — Encounter: Payer: Self-pay | Admitting: Urgent Care

## 2018-06-01 MED ORDER — FERROUS SULFATE 325 (65 FE) MG PO TBEC: DELAYED_RELEASE_TABLET | ORAL | 0 refills | Status: DC

## 2018-06-01 MED ORDER — DOCUSATE SODIUM 100 MG PO CAPS: 100.0000 mg | ORAL_CAPSULE | Freq: Every day | ORAL | 0 refills | Status: DC

## 2018-06-02 ENCOUNTER — Encounter: Payer: Self-pay | Admitting: Family Medicine

## 2018-06-11 ENCOUNTER — Encounter: Payer: Self-pay | Admitting: Hematology and Oncology

## 2018-06-11 ENCOUNTER — Other Ambulatory Visit: Payer: Self-pay | Admitting: Urgent Care

## 2018-06-11 ENCOUNTER — Inpatient Hospital Stay: Payer: Medicare Other | Attending: Hematology and Oncology

## 2018-06-11 DIAGNOSIS — N939 Abnormal uterine and vaginal bleeding, unspecified: Secondary | ICD-10-CM | POA: Diagnosis not present

## 2018-06-11 DIAGNOSIS — E538 Deficiency of other specified B group vitamins: Secondary | ICD-10-CM | POA: Insufficient documentation

## 2018-06-11 DIAGNOSIS — D649 Anemia, unspecified: Secondary | ICD-10-CM | POA: Diagnosis not present

## 2018-06-11 DIAGNOSIS — R5383 Other fatigue: Secondary | ICD-10-CM | POA: Insufficient documentation

## 2018-06-11 DIAGNOSIS — E039 Hypothyroidism, unspecified: Secondary | ICD-10-CM | POA: Diagnosis not present

## 2018-06-11 DIAGNOSIS — D509 Iron deficiency anemia, unspecified: Secondary | ICD-10-CM

## 2018-06-11 LAB — CBC WITH DIFFERENTIAL/PLATELET
Basophils Absolute: 0.1 10*3/uL (ref 0–0.1)
Basophils Relative: 0 %
Eosinophils Absolute: 0.2 10*3/uL (ref 0–0.7)
Eosinophils Relative: 2 %
HCT: 40.4 % (ref 35.0–47.0)
Hemoglobin: 13.7 g/dL (ref 12.0–16.0)
Lymphocytes Relative: 29 %
Lymphs Abs: 3.5 10*3/uL (ref 1.0–3.6)
MCH: 29.8 pg (ref 26.0–34.0)
MCHC: 33.8 g/dL (ref 32.0–36.0)
MCV: 88 fL (ref 80.0–100.0)
Monocytes Absolute: 0.7 10*3/uL (ref 0.2–0.9)
Monocytes Relative: 5 %
Neutro Abs: 7.8 10*3/uL — ABNORMAL HIGH (ref 1.4–6.5)
Neutrophils Relative %: 64 %
Platelets: 277 10*3/uL (ref 150–440)
RBC: 4.6 MIL/uL (ref 3.80–5.20)
RDW: 15.9 % — ABNORMAL HIGH (ref 11.5–14.5)
WBC: 12.3 10*3/uL — ABNORMAL HIGH (ref 3.6–11.0)

## 2018-06-11 LAB — FERRITIN: Ferritin: 38 ng/mL (ref 11–307)

## 2018-06-11 LAB — RETICULOCYTES
RBC.: 4.56 MIL/uL (ref 3.80–5.20)
Retic Count, Absolute: 68.4 10*3/uL (ref 19.0–183.0)
Retic Ct Pct: 1.5 % (ref 0.4–3.1)

## 2018-06-11 NOTE — Progress Notes (Signed)
[facility] day:  06/12/2018  Chief Complaint: Amanda Webb is a 47 y.o. female with low iron stores who is seen for a 2 month assessment on oral iron.   HPI:  The patient was last seen in the hematology clinic on 04/08/2018. At that time, patient was "exhausted". She was under a lot of stress (family relationships, care of grandchild, personal health). Followed by "a therapist" and taking "medication". Vaginal bleeding resolved following Depo Provera injection. On oral iron BID. Continued fatigue and ice pica. Exam was unremarkable. CBC was improving.   Urinalysis on 04/08/2018 was (-) for blood. MMA 258 (0 - 378 nmol/L). She was contacted on 04/13/2018 and advised to start oral B12 1,000 mcg daily.    Patient contacted the clinic on 04/17/2018 to reported continued fatigue and forgetfulness. She was asking for her ferritin to be rechecked. Of note, patient just started on oral iron, and checking a ferritin at this point would not have been beneficial. Patient stated, "Something is going on. My mother's ferritin and hemoglobin was higher than mine and they gave her and infusion". Patient educated on the fact that the oral iron had already started to improve her iron levels and hemoglobin, therefore additional oral iron needed to be tried before intravenous replacement.   She contacted the clinic on 05/31/2018 to report constipation. Patient had decreased her oral iron back down to once a day. She "tried a capful of Miralax, but didn't get much of a result". She was prescribed docusate 100 mg daily, and asked to advance her oral iron back up to at least BID as tolerated.   Labs rechecked on 06/11/2018. CBC revealed a WBC of 12,300 (ANC 7800). Hemoglobin 13.7, hematocrit 40.4, MCV 88.0, and platelets 277,000. Reticulocytes 1.5%. Ferritin had improved to 38 (previously 11).   Symptomatically, she feels "ok".  She would like to have more energy.  She is  taking the oral iron BID with OJ/vitamin C.  She had constipation last week and held oral iron x 4 days.  She describes an interval "gallbladder attack".   Past Medical History:  Diagnosis Date  . Allergy   . Anemia   . Anxiety   . Arrhythmia   . Arthritis   . Cervical dysplasia   . Cystitis   . Depression   . GERD (gastroesophageal reflux disease)   . Gross hematuria   . Heart murmur   . HLD (hyperlipidemia)   . Hypertension   . Hypothyroid   . Tobacco abuse     Past Surgical History:  Procedure Laterality Date  . CARPAL TUNNEL RELEASE Bilateral 2011  . FOOT SURGERY Right 2013   Plantar fascia  . TONSILLECTOMY  1979    Family History  Problem Relation Age of Onset  . Depression Mother   . Mental illness Mother   . Alcohol abuse Mother   . Cancer Maternal Grandmother   . Diabetes Maternal Grandmother   . Kidney disease Maternal Grandfather   . Diabetes Father   . Mental illness Father   . Depression Father   . Drug abuse Father   . Depression Paternal Grandfather   . Drug abuse Paternal Grandfather   . Bladder Cancer Neg Hx     Social History:  reports that she has been smoking cigarettes.  She has a 30.00 pack-year smoking history. She uses smokeless tobacco. She reports that she does not drink alcohol or use drugs.  She smokes 1/2 pack/day  x 30 years.  Patient denies known exposures to radiation on toxins.  She is caring for her 95 year old grandson.  The patient is alone today.  Allergies:  Allergies  Allergen Reactions  . Bee Venom Anaphylaxis, Hives and Swelling    Carries Epi pen. Carries Epi pen.  . Cat Hair Extract Itching, Other (See Comments) and Swelling    Allergic to trees, nuts, wheat, grass, cats & dogs - itchy watery eyes, swelling. Uses Zyrtec & Flonase & Benadryl if really bad. Used to get allergy shots. Allergic to trees, nuts, wheat, grass, cats & dogs - itchy watery eyes, swelling. Uses Zyrtec & Flonase & Benadryl if really bad. Used to get  allergy shots.  . Tetracycline     Other reaction(s): Unknown  . Tetracyclines & Related Nausea And Vomiting  . Lac Bovis Diarrhea and Nausea And Vomiting  . Lactose Diarrhea and Nausea And Vomiting  . Milk Protein Diarrhea and Nausea And Vomiting  . Tape Other (See Comments) and Rash    Needs to use paper tape. Breaks out with severe rash, pulls skin off when using adhesive.    Current Medications: Current Outpatient Medications  Medication Sig Dispense Refill  . atenolol (TENORMIN) 50 MG tablet Take 1 tablet (50 mg total) by mouth daily. 30 tablet 11  . Cetirizine HCl (ZYRTEC ALLERGY) 10 MG CAPS     . docusate sodium (COLACE) 100 MG capsule Take 1 capsule (100 mg total) by mouth daily. 30 capsule 0  . ferrous sulfate 325 (65 FE) MG EC tablet 1 tablet (325mg ) BID to TID based on tolerance. TAKE WITH SOURCE OF VITAMIN C. 90 tablet 0  . fluticasone (FLONASE) 50 MCG/ACT nasal spray instill 2 sprays into each nostril once daily 16 g 5  . fluvoxaMINE (LUVOX) 100 MG tablet Take 150 mg by mouth 2 (two) times daily.    Marland Kitchen ipratropium (ATROVENT HFA) 17 MCG/ACT inhaler Inhale 2 puffs into the lungs every 6 (six) hours as needed for wheezing. 1 Inhaler 12  . ketoconazole (NIZORAL) 2 % shampoo apply topically as directed 120 mL 3  . lamoTRIgine (LAMICTAL) 200 MG tablet Take 200 mg by mouth daily.    Marland Kitchen levothyroxine (SYNTHROID, LEVOTHROID) 50 MCG tablet Take 1 tablet (50 mcg total) by mouth every morning. 30 tablet 5  . medroxyPROGESTERone (DEPO-PROVERA) 150 MG/ML injection Inject 1 mL (150 mg total) into the muscle every 3 (three) months. 1 mL 2  . pantoprazole (PROTONIX) 20 MG tablet Take 1 tablet (20 mg total) by mouth daily. 30 tablet 1  . QUEtiapine (SEROQUEL) 50 MG tablet Take 50 mg by mouth 3 (three) times daily.    . SUMAtriptan (IMITREX) 5 MG/ACT nasal spray Place 1 spray (5 mg total) every 2 (two) hours as needed into the nose for migraine. Max 3 doses in 24 hours 1 Inhaler 0  . VENTOLIN HFA  108 (90 Base) MCG/ACT inhaler inhale 1 to 2 puffs by mouth every 4 to 6 hours if needed for shortness of breath or wheezing 18 g 1   No current facility-administered medications for this visit.     Review of Systems  Constitutional: Positive for malaise/fatigue (exhausted). Negative for diaphoresis, fever and weight loss.       Feels "ok".  Would like more energy.  HENT: Negative.   Eyes: Negative.   Respiratory: Negative for cough, hemoptysis and sputum production. Shortness of breath: some exertional.   Cardiovascular: Negative for chest pain, palpitations, orthopnea, leg swelling  and PND.  Gastrointestinal: Negative for abdominal pain, blood in stool, constipation, diarrhea, melena, nausea and vomiting.  Genitourinary: Negative for dysuria, frequency, hematuria and urgency.       Urinary incontinence. Vaginal bleeding; resolved s/p Depo Provera.  Musculoskeletal: Positive for joint pain (arthritis in hands). Negative for back pain, falls and myalgias.       Accustomed to joint pain.  Skin: Negative for itching and rash.  Neurological: Positive for headaches (chronic- off/on). Negative for dizziness, tremors and weakness.  Endo/Heme/Allergies: Does not bruise/bleed easily.       HYPOthyroidism; takes levothyroxine  Psychiatric/Behavioral: Positive for depression. Negative for memory loss (poor memory) and suicidal ideas. The patient is nervous/anxious (irritability). The patient does not have insomnia.   All other systems reviewed and are negative.  Performance status (ECOG): 1-2   Physical Exam: Blood pressure 118/79, pulse 83, temperature 98.5 F (36.9 C), temperature source Tympanic, resp. rate 18, weight 252 lb 4 oz (114.4 kg), SpO2 97 %. GENERAL:  Well developed, well nourished, woman sitting comfortably in the exam room in no acute distress. MENTAL STATUS:  Alert and oriented to person, place and time. HEAD:  Blonde hair with darker roots.  Normocephalic, atraumatic, face  symmetric, no Cushingoid features. EYES:  Blue eyes.  Pupils equal round and reactive to light and accomodation.  No conjunctivitis or scleral icterus. ENT:  Oropharynx clear without lesion.  Tongue normal. Mucous membranes moist.  RESPIRATORY:  Clear to auscultation without rales, wheezes or rhonchi. CARDIOVASCULAR:  Regular rate and rhythm without murmur, rub or gallop. ABDOMEN:  Soft, non-tender, with active bowel sounds, and no hepatosplenomegaly.  No masses. SKIN:  No rashes, ulcers or lesions. EXTREMITIES: No edema, no skin discoloration or tenderness.  No palpable cords. LYMPH NODES: No palpable cervical, supraclavicular, axillary or inguinal adenopathy  NEUROLOGICAL: Unremarkable. PSYCH:  Appropriate.    Appointment on 06/12/2018  Component Date Value Ref Range Status  . Parietal Cell Antibody-IgG 06/12/2018 12.5  0.0 - 20.0 Units Final   Comment: (NOTE)                                Negative    0.0 - 20.0                                Equivocal  20.1 - 24.9                                Positive         >24.9 Parietal Cell Antibodies are found in 90% of patients with pernicious anemia and 30% of first degree relatives with pernicious anemia. Performed At: [facility] [place], [place] [id number] Rush Farmer MD IZ:[id number] Performed at [facility], [address], [place], [place] [address]   . Vitamin B-12 06/12/2018 502  180 - 914 pg/mL Final   Comment: (NOTE) This assay is not validated for testing neonatal or myeloproliferative syndrome specimens for Vitamin B12 levels. Performed at [facility], [place] [address] [place],  [address]   . Intrinsic Factor 06/12/2018 1.0  0.0 - 1.1 AU/mL Final   Comment: (NOTE) Performed At: [facility] [place], [place] [id number] Rush Farmer MD WI:[id number] Performed at [facility], [address] [place],  [address]  Appointment on  06/11/2018  Component Date Value Ref Range Status  . Retic Ct Pct 06/11/2018 1.5  0.4 - 3.1 % Final  . RBC. 06/11/2018 4.56  3.80 - 5.20 MIL/uL Final  . Retic Count, Absolute 06/11/2018 68.4  19.0 - 183.0 K/uL Final   Performed at [facility], [address] [place], [place] [address]  . Ferritin 06/11/2018 38  11 - 307 ng/mL Final   Performed at [facility], [address] [place], [place] [address]  . WBC 06/11/2018 12.3* 3.6 - 11.0 K/uL Final  . RBC 06/11/2018 4.60  3.80 - 5.20 MIL/uL Final  . Hemoglobin 06/11/2018 13.7  12.0 - 16.0 g/dL Final  . HCT 06/11/2018 40.4  35.0 - 47.0 % Final  . MCV 06/11/2018 88.0  80.0 - 100.0 fL Final  . MCH 06/11/2018 29.8  26.0 - 34.0 pg Final  . MCHC 06/11/2018 33.8  32.0 - 36.0 g/dL Final  . RDW 06/11/2018 15.9* 11.5 - 14.5 % Final  . Platelets 06/11/2018 277  150 - 440 K/uL Final  . Neutrophils Relative % 06/11/2018 64  % Final  . Neutro Abs 06/11/2018 7.8* 1.4 - 6.5 K/uL Final  . Lymphocytes Relative 06/11/2018 29  % Final  . Lymphs Abs 06/11/2018 3.5  1.0 - 3.6 K/uL Final  . Monocytes Relative 06/11/2018 5  % Final  . Monocytes Absolute 06/11/2018 0.7  0.2 - 0.9 K/uL Final  . Eosinophils Relative 06/11/2018 2  % Final  . Eosinophils Absolute 06/11/2018 0.2  0 - 0.7 K/uL Final  . Basophils Relative 06/11/2018 0  % Final  . Basophils Absolute 06/11/2018 0.1  0 - 0.1 K/uL Final   Performed at [facility], [address], [place], [place] [address]    Assessment:  Olivea Sonnen is a 47 y.o. female with a mild normocytic anemia.  She has iron deficiency.  Hemoglobin was normal until 03/12/2018.  She notes scant vaginal bleeding on DepoProvera.  She denies any hematuria, melena or hematochezia.  Diet appears good.  She has ice pica.  CBC on 03/12/2018 revealed a hematocrit of 32.7, hemoglobin 11.2, and MCV 82.4.  Normal studies included:  Creatinine, LFTs, TSH and free T4.  Ferritin was 11.  Iron saturation was  8% and TIBC 362.  Work-up on 03/24/2018 revealed a hematocrit of 35.7, hemoglobin 11.9, and MCV 84.2.  Folate was 17.2.  B12 was 296 (low normal).  MMA was normal.  Retic was 1.3%.  Guaiac cards were negative x 3.  She began oral B12 on 04/13/2018.  Ferritin has been followed: 8 on 04/28/2012, 20 on 09/03/2012, 18 on 09/28/2013, 11 on 03/12/2018, and 38 on 06/11/2018.  She is on oral iron.  Symptomatically, she remains fatigued.  She may have sleep apnea.  Exam is unremarkable. Hemoglobin is 13.7.  Plan: 1. Review labs from 06/11/2018. No anemia. MCV is normal. Ferritin has improved.  2. Discuss improvement in hemoglobin after institution of oral iron.  Continue oral iron BID with vitamin C to replete iron stores (ferritin goal 100).  3. Discuss B12 deficiency. MMA 258. Started oral B12 on 04/13/2018.  Recheck B12 today. 4. Review secondary causes of fatigue. Differential includes OSAH syndrome as patient is fatigued, has EDS symptoms, frequent headaches, and she snores. Patient does not feel rested after a full night of rest. Offered at last visit to call PCP to discuss need for PSG to formally diagnose OSAH, however patient states, "I will look into that  later".   She stated that "I woke myself up snoring".  She is willing to consider sleep apnea testing. 5. RTC in 6 weeks for MD assessment, labs (CBC with diff, ferritin).    Honor Loh, NP 06/12/2018, 5:35 PM   I saw and evaluated the patient, participating in the key portions of the service and reviewing pertinent diagnostic studies and records.  I reviewed the nurse practitioner's note and agree with the findings and the plan.  The assessment and plan were discussed with the patient.  Several questions were asked by the patient and answered.   Nolon Stalls, MD 06/12/2018, 5:35 PM

## 2018-06-12 ENCOUNTER — Encounter: Payer: Self-pay | Admitting: Hematology and Oncology

## 2018-06-12 ENCOUNTER — Inpatient Hospital Stay (HOSPITAL_BASED_OUTPATIENT_CLINIC_OR_DEPARTMENT_OTHER): Payer: Medicare Other | Admitting: Hematology and Oncology

## 2018-06-12 ENCOUNTER — Inpatient Hospital Stay: Payer: Medicare Other

## 2018-06-12 VITALS — BP 118/79 | HR 83 | Temp 98.5°F | Resp 18 | Wt 252.2 lb

## 2018-06-12 DIAGNOSIS — E039 Hypothyroidism, unspecified: Secondary | ICD-10-CM

## 2018-06-12 DIAGNOSIS — N939 Abnormal uterine and vaginal bleeding, unspecified: Secondary | ICD-10-CM | POA: Diagnosis not present

## 2018-06-12 DIAGNOSIS — D649 Anemia, unspecified: Secondary | ICD-10-CM | POA: Diagnosis not present

## 2018-06-12 DIAGNOSIS — R5383 Other fatigue: Secondary | ICD-10-CM

## 2018-06-12 DIAGNOSIS — E538 Deficiency of other specified B group vitamins: Secondary | ICD-10-CM | POA: Diagnosis not present

## 2018-06-12 DIAGNOSIS — D509 Iron deficiency anemia, unspecified: Secondary | ICD-10-CM

## 2018-06-12 LAB — VITAMIN B12: Vitamin B-12: 502 pg/mL (ref 180–914)

## 2018-06-12 NOTE — Progress Notes (Signed)
Patient offers no complaints today. 

## 2018-06-13 ENCOUNTER — Ambulatory Visit (INDEPENDENT_AMBULATORY_CARE_PROVIDER_SITE_OTHER): Payer: Medicare Other | Admitting: Obstetrics and Gynecology

## 2018-06-13 VITALS — BP 108/70 | HR 72 | Ht 66.5 in | Wt 253.8 lb

## 2018-06-13 DIAGNOSIS — Z3042 Encounter for surveillance of injectable contraceptive: Secondary | ICD-10-CM

## 2018-06-13 LAB — ANTI-PARIETAL ANTIBODY: Parietal Cell Antibody-IgG: 12.5 Units (ref 0.0–20.0)

## 2018-06-13 LAB — INTRINSIC FACTOR ANTIBODIES: Intrinsic Factor: 1 AU/mL (ref 0.0–1.1)

## 2018-06-13 MED ORDER — MEDROXYPROGESTERONE ACETATE 150 MG/ML IM SUSP
150.0000 mg | Freq: Once | INTRAMUSCULAR | Status: AC
  Administered 2018-06-13: 150 mg via INTRAMUSCULAR

## 2018-06-13 NOTE — Progress Notes (Signed)
Date last pap: n/a  Last Depo-Provera: 03/29/2018. Side Effects if any: none Serum HCG indicated? n/a Depo-Provera 150 mg IM given by: CM Next appointment due  9/13---------9/27. Pt needs AE in 2-3 weeks.  BP 108/70   Pulse 72   Ht 5' 6.5" (1.689 m)   Wt 253 lb 12.8 oz (115.1 kg)   LMP  (LMP Unknown)   BMI 40.35 kg/m     I have reviewed the record and concur with patient management and plan. DEFRANCESCO, Hassell Done, MD, Cherlynn June

## 2018-06-27 NOTE — Progress Notes (Signed)
/ ANNUAL PREVENTATIVE CARE GYN  ENCOUNTER NOTE  Subjective:       Amanda Webb is a 47 y.o. [id number] female here for a routine annual gynecologic exam.  Current complaints:  1.  Cramps- on/off - no meds needed; symptoms are random.  Patient is taking Depo-Provera and is amenorrheic. Patient is experiencing vasomotor symptoms on a daily basis I have recommended discontinuing Depo-Provera and checking FSH test to assess for menopause;, however patient declines this because this process was done previously and was met with patient dissatisfaction regarding abnormal uterine bleeding.  [name] reports no major new interval health issues.  Bowel function is normal.  Bladder function is normal.  Mammogram is up-to-date.    Gynecologic History No LMP recorded (lmp unknown). Patient has had an injection. Contraception: abstinence Last Pap: 04/02/2017 neg/neg. Results were: normal Last mammogram: 09/23/2017   wnl. Results were: normal Patient has significant psychosocial stressors; she does see a psychiatrist; I have encouraged her to also see a counselor  Obstetric History OB History  Gravida Para Term Preterm AB Living  '5 4 4   1 4  '$ SAB TAB Ectopic Multiple Live Births    1     4    # Outcome Date GA Lbr Len/2nd Weight Sex Delivery Anes PTL Lv  5 Term 1996   9 lb 9.6 oz (4.355 kg) F Vag-Spont   LIV  4 TAB 1996          3 Term 1994   7 lb 8 oz (3.402 kg) M Vag-Spont   LIV  2 Term 1992   7 lb 8 oz (3.402 kg) F Vag-Spont   LIV  1 Term 1990   7 lb 8 oz (3.402 kg) M Vag-Spont   LIV    Obstetric Comments  a    Past Medical History:  Diagnosis Date  . Allergy   . Anemia   . Anxiety   . Arrhythmia   . Arthritis   . Cervical dysplasia   . Cystitis   . Depression   . GERD (gastroesophageal reflux disease)   . Gross hematuria   . Heart murmur   . HLD (hyperlipidemia)   . Hypertension   . Hypothyroid   . Tobacco abuse     Past Surgical History:  Procedure Laterality Date   . CARPAL TUNNEL RELEASE Bilateral 2011  . FOOT SURGERY Right 2013   Plantar fascia  . TONSILLECTOMY  1979    Current Outpatient Medications on File Prior to Visit  Medication Sig Dispense Refill  . atenolol (TENORMIN) 50 MG tablet Take 1 tablet (50 mg total) by mouth daily. 30 tablet 11  . Cetirizine HCl (ZYRTEC ALLERGY) 10 MG CAPS     . docusate sodium (COLACE) 100 MG capsule Take 1 capsule (100 mg total) by mouth daily. 30 capsule 0  . ferrous sulfate 325 (65 FE) MG EC tablet 1 tablet ('325mg'$ ) BID to TID based on tolerance. TAKE WITH SOURCE OF VITAMIN C. 90 tablet 0  . fluticasone (FLONASE) 50 MCG/ACT nasal spray instill 2 sprays into each nostril once daily 16 g 5  . fluvoxaMINE (LUVOX) 100 MG tablet Take 150 mg by mouth 2 (two) times daily.    Marland Kitchen ipratropium (ATROVENT HFA) 17 MCG/ACT inhaler Inhale 2 puffs into the lungs every 6 (six) hours as needed for wheezing. 1 Inhaler 12  . ketoconazole (NIZORAL) 2 % shampoo apply topically as directed 120 mL 3  . lamoTRIgine (LAMICTAL) 200 MG tablet Take 200  mg by mouth daily.    Marland Kitchen levothyroxine (SYNTHROID, LEVOTHROID) 50 MCG tablet Take 1 tablet (50 mcg total) by mouth every morning. 30 tablet 5  . medroxyPROGESTERone (DEPO-PROVERA) 150 MG/ML injection Inject 1 mL (150 mg total) into the muscle every 3 (three) months. 1 mL 2  . pantoprazole (PROTONIX) 20 MG tablet Take 1 tablet (20 mg total) by mouth daily. 30 tablet 1  . QUEtiapine (SEROQUEL) 50 MG tablet Take 50 mg by mouth 3 (three) times daily.    . SUMAtriptan (IMITREX) 5 MG/ACT nasal spray Place 1 spray (5 mg total) every 2 (two) hours as needed into the nose for migraine. Max 3 doses in 24 hours 1 Inhaler 0  . VENTOLIN HFA 108 (90 Base) MCG/ACT inhaler inhale 1 to 2 puffs by mouth every 4 to 6 hours if needed for shortness of breath or wheezing 18 g 1   No current facility-administered medications on file prior to visit.     Allergies  Allergen Reactions  . Bee Venom Anaphylaxis, Hives  and Swelling    Carries Epi pen. Carries Epi pen.  . Cat Hair Extract Itching, Other (See Comments) and Swelling    Allergic to trees, nuts, wheat, grass, cats & dogs - itchy watery eyes, swelling. Uses Zyrtec & Flonase & Benadryl if really bad. Used to get allergy shots. Allergic to trees, nuts, wheat, grass, cats & dogs - itchy watery eyes, swelling. Uses Zyrtec & Flonase & Benadryl if really bad. Used to get allergy shots.  . Tetracycline     Other reaction(s): Unknown  . Tetracyclines & Related Nausea And Vomiting  . Lac Bovis Diarrhea and Nausea And Vomiting  . Lactose Diarrhea and Nausea And Vomiting  . Milk Protein Diarrhea and Nausea And Vomiting  . Tape Other (See Comments) and Rash    Needs to use paper tape. Breaks out with severe rash, pulls skin off when using adhesive.    Social History   Socioeconomic History  . Marital status: Divorced    Spouse name: Not on file  . Number of children: Not on file  . Years of education: Some college  . Highest education level: Not on file  Occupational History  . Not on file  Social Needs  . Financial resource strain: Not on file  . Food insecurity:    Worry: Not on file    Inability: Not on file  . Transportation needs:    Medical: Not on file    Non-medical: Not on file  Tobacco Use  . Smoking status: Current Every Day Smoker    Packs/day: 1.00    Years: 30.00    Pack years: 30.00    Types: Cigarettes  . Smokeless tobacco: Current User  Substance and Sexual Activity  . Alcohol use: No  . Drug use: No  . Sexual activity: Not Currently    Birth control/protection: None  Lifestyle  . Physical activity:    Days per week: Not on file    Minutes per session: Not on file  . Stress: Not on file  Relationships  . Social connections:    Talks on phone: Not on file    Gets together: Not on file    Attends religious service: Not on file    Active member of club or organization: Not on file    Attends meetings of clubs or  organizations: Not on file    Relationship status: Not on file  . Intimate partner violence:    Fear  of current or ex partner: Not on file    Emotionally abused: Not on file    Physically abused: Not on file    Forced sexual activity: Not on file  Other Topics Concern  . Not on file  Social History Narrative  . Not on file    Family History  Problem Relation Age of Onset  . Depression Mother   . Mental illness Mother   . Alcohol abuse Mother   . Cancer Maternal Grandmother   . Diabetes Maternal Grandmother   . Kidney disease Maternal Grandfather   . Diabetes Father   . Mental illness Father   . Depression Father   . Drug abuse Father   . Depression Paternal Grandfather   . Drug abuse Paternal Grandfather   . Bladder Cancer Neg Hx     The following portions of the patient's history were reviewed and updated as appropriate: allergies, current medications, past family history, past medical history, past social history, past surgical history and problem list.  Review of Systems Review of Systems  Constitutional:       Vasomotor symptoms persist  HENT: Negative.   Eyes: Negative.   Respiratory: Negative.   Cardiovascular: Negative.   Gastrointestinal: Negative for blood in stool, constipation, diarrhea, heartburn, melena, nausea and vomiting.       Random cramps, not affecting her routine activities  Genitourinary: Negative.   Skin: Negative.   Neurological: Negative.   Psychiatric/Behavioral: Negative.     Objective:   LMP  (LMP Unknown)  BP 120/68   Pulse 76   Ht 5' 6.5" (1.689 m)   Wt 252 lb 11.2 oz (114.6 kg)   LMP  (LMP Unknown)   BMI 40.18 kg/m   CONSTITUTIONAL: Well-developed, obese female in no acute distress.  PSYCHIATRIC: Normal mood and affect. Normal behavior. Normal judgment and thought content. [place]: Alert and oriented to person, place, and time. Normal muscle tone coordination. No cranial nerve deficit noted. HENT:  Normocephalic,  atraumatic, External right and left ear normal.  EYES: Conjunctivae and EOM are normal.  No scleral icterus.  NECK: Normal range of motion, supple, no masses.  Normal thyroid.  SKIN: Skin is warm and dry. No rash noted. Not diaphoretic. No erythema. No pallor. CARDIOVASCULAR: Normal heart rate noted, regular rhythm, no murmur. RESPIRATORY: Clear to auscultation bilaterally. Effort and breath sounds normal, no problems with respiration noted. BREASTS: Symmetric in size. No masses, skin changes, nipple drainage, or lymphadenopathy. ABDOMEN: Soft, normal bowel sounds, no distention noted.  No tenderness, rebound or guarding.  BLADDER: Normal PELVIC:  External Genitalia: Normal  BUS: Normal  Vagina: Normal estrogen effect; no discharge  Cervix: Normal; no lesions; no cervical motion tenderness  Uterus: Normal; midplane, normal size and shape, mobile, nontender  Adnexa: Normal; nonpalpable and nontender  RV: External Exam NormaI, No Rectal Masses and Normal Sphincter tone  MUSCULOSKELETAL: Normal range of motion. No tenderness.  No cyanosis, clubbing, or edema. LYMPHATIC: No Axillary, Supraclavicular, or Inguinal Adenopathy.    Assessment:   Annual gynecologic examination 47 y.o. Contraception: abstinence bmi-40 Problem List Items Addressed This Visit    Encounter for surveillance of injectable contraceptive   Hypothyroidism    Other Visit Diagnoses    Well woman exam with routine gynecological exam    -  Primary   Family history of ovarian cancer       Tobacco user       Screening for breast cancer        Hot flashes  Amenorrhea, Depo-Provera induced Plan:  Pap: Due 2021 Mammogram: BIBC- ordered Stool Guaiac Testing:  Not Indicated Labs: thru pcp  Routine preventative health maintenance measures emphasized: Exercise/Diet/Weight control, Tobacco Warnings, Alcohol/Substance use risks and Stress Management  Recommend controlled weight loss at 1 pound per month Extensive  discussion regarding patient having no potential indications for hysterectomy at this time. Return to [place], [place]  Brayton Mars, MD   Note: This dictation was prepared with Dragon dictation along with smaller phrase technology. Any transcriptional errors that result from this process are unintentional.

## 2018-07-02 ENCOUNTER — Encounter: Payer: Self-pay | Admitting: Obstetrics and Gynecology

## 2018-07-02 ENCOUNTER — Ambulatory Visit (INDEPENDENT_AMBULATORY_CARE_PROVIDER_SITE_OTHER): Payer: Medicare Other | Admitting: Obstetrics and Gynecology

## 2018-07-02 VITALS — BP 120/68 | HR 76 | Ht 66.5 in | Wt 252.7 lb

## 2018-07-02 DIAGNOSIS — Z0000 Encounter for general adult medical examination without abnormal findings: Secondary | ICD-10-CM | POA: Diagnosis not present

## 2018-07-02 DIAGNOSIS — Z3042 Encounter for surveillance of injectable contraceptive: Secondary | ICD-10-CM

## 2018-07-02 DIAGNOSIS — E039 Hypothyroidism, unspecified: Secondary | ICD-10-CM

## 2018-07-02 DIAGNOSIS — Z720 Tobacco use: Secondary | ICD-10-CM | POA: Diagnosis not present

## 2018-07-02 DIAGNOSIS — Z1231 Encounter for screening mammogram for malignant neoplasm of breast: Secondary | ICD-10-CM | POA: Diagnosis not present

## 2018-07-02 DIAGNOSIS — Z6841 Body Mass Index (BMI) 40.0 and over, adult: Secondary | ICD-10-CM | POA: Diagnosis not present

## 2018-07-02 DIAGNOSIS — Z8041 Family history of malignant neoplasm of ovary: Secondary | ICD-10-CM | POA: Diagnosis not present

## 2018-07-02 DIAGNOSIS — N912 Amenorrhea, unspecified: Secondary | ICD-10-CM

## 2018-07-02 DIAGNOSIS — Z01419 Encounter for gynecological examination (general) (routine) without abnormal findings: Secondary | ICD-10-CM | POA: Diagnosis not present

## 2018-07-02 DIAGNOSIS — Z1239 Encounter for other screening for malignant neoplasm of breast: Secondary | ICD-10-CM

## 2018-07-02 DIAGNOSIS — R232 Flushing: Secondary | ICD-10-CM | POA: Diagnosis not present

## 2018-07-02 NOTE — Patient Instructions (Signed)
1.  No Pap smear is done.  Next Pap smear is due 2021. 2.  Mammogram up-to-date. 3.  Screening labs are to be obtained through primary care 4.  Continue with healthy eating exercise with controlled weight loss.  Control weight loss should be pound per month goal over the next year. 5.  Continue with Depo-Provera 150 mg intramuscular every 3 months 6.  Return in 1 year for annual exam   Health Maintenance for Postmenopausal Women Menopause is a normal process in which your reproductive ability comes to an end. This process happens gradually over a span of months to years, usually between the ages of 31 and 49. Menopause is complete when you have missed 12 consecutive menstrual periods. It is important to talk with your health care provider about some of the most common conditions that affect postmenopausal women, such as heart disease, cancer, and bone loss (osteoporosis). Adopting a healthy lifestyle and getting preventive care can help to promote your health and wellness. Those actions can also lower your chances of developing some of these common conditions. What should I know about menopause? During menopause, you may experience a number of symptoms, such as:  Moderate-to-severe hot flashes.  Night sweats.  Decrease in sex drive.  Mood swings.  Headaches.  Tiredness.  Irritability.  Memory problems.  Insomnia.  Choosing to treat or not to treat menopausal changes is an individual decision that you make with your health care provider. What should I know about hormone replacement therapy and supplements? Hormone therapy products are effective for treating symptoms that are associated with menopause, such as hot flashes and night sweats. Hormone replacement carries certain risks, especially as you become older. If you are thinking about using estrogen or estrogen with progestin treatments, discuss the benefits and risks with your health care provider. What should I know about heart  disease and stroke? Heart disease, heart attack, and stroke become more likely as you age. This may be due, in part, to the hormonal changes that your body experiences during menopause. These can affect how your body processes dietary fats, triglycerides, and cholesterol. Heart attack and stroke are both medical emergencies. There are many things that you can do to help prevent heart disease and stroke:  Have your blood pressure checked at least every 1-2 years. High blood pressure causes heart disease and increases the risk of stroke.  If you are 77-10 years old, ask your health care provider if you should take aspirin to prevent a heart attack or a stroke.  Do not use any tobacco products, including cigarettes, chewing tobacco, or electronic cigarettes. If you need help quitting, ask your health care provider.  It is important to eat a healthy diet and maintain a healthy weight. ? Be sure to include plenty of vegetables, fruits, low-fat dairy products, and lean protein. ? Avoid eating foods that are high in solid fats, added sugars, or salt (sodium).  Get regular exercise. This is one of the most important things that you can do for your health. ? Try to exercise for at least 150 minutes each week. The type of exercise that you do should increase your heart rate and make you sweat. This is known as moderate-intensity exercise. ? Try to do strengthening exercises at least twice each week. Do these in addition to the moderate-intensity exercise.  Know your numbers.Ask your health care provider to check your cholesterol and your blood glucose. Continue to have your blood tested as directed by your health care  provider.  What should I know about cancer screening? There are several types of cancer. Take the following steps to reduce your risk and to catch any cancer development as early as possible. Breast Cancer  Practice breast self-awareness. ? This means understanding how your breasts  normally appear and feel. ? It also means doing regular breast self-exams. Let your health care provider know about any changes, no matter how small.  If you are 29 or older, have a clinician do a breast exam (clinical breast exam or CBE) every year. Depending on your age, family history, and medical history, it may be recommended that you also have a yearly breast X-ray (mammogram).  If you have a family history of breast cancer, talk with your health care provider about genetic screening.  If you are at high risk for breast cancer, talk with your health care provider about having an MRI and a mammogram every year.  Breast cancer (BRCA) gene test is recommended for women who have family members with BRCA-related cancers. Results of the assessment will determine the need for genetic counseling and BRCA1 and for BRCA2 testing. BRCA-related cancers include these types: ? Breast. This occurs in males or females. ? Ovarian. ? Tubal. This may also be called fallopian tube cancer. ? Cancer of the abdominal or pelvic lining (peritoneal cancer). ? Prostate. ? Pancreatic.  Cervical, Uterine, and Ovarian Cancer Your health care provider may recommend that you be screened regularly for cancer of the pelvic organs. These include your ovaries, uterus, and vagina. This screening involves a pelvic exam, which includes checking for microscopic changes to the surface of your cervix (Pap test).  For women ages 21-65, health care providers may recommend a pelvic exam and a Pap test every three years. For women ages 7-65, they may recommend the Pap test and pelvic exam, combined with testing for human papilloma virus (HPV), every five years. Some types of HPV increase your risk of cervical cancer. Testing for HPV may also be done on women of any age who have unclear Pap test results.  Other health care providers may not recommend any screening for nonpregnant women who are considered low risk for pelvic cancer  and have no symptoms. Ask your health care provider if a screening pelvic exam is right for you.  If you have had past treatment for cervical cancer or a condition that could lead to cancer, you need Pap tests and screening for cancer for at least 20 years after your treatment. If Pap tests have been discontinued for you, your risk factors (such as having a new sexual partner) need to be reassessed to determine if you should start having screenings again. Some women have medical problems that increase the chance of getting cervical cancer. In these cases, your health care provider may recommend that you have screening and Pap tests more often.  If you have a family history of uterine cancer or ovarian cancer, talk with your health care provider about genetic screening.  If you have vaginal bleeding after reaching menopause, tell your health care provider.  There are currently no reliable tests available to screen for ovarian cancer.  Lung Cancer Lung cancer screening is recommended for adults 55-32 years old who are at high risk for lung cancer because of a history of smoking. A yearly low-dose CT scan of the lungs is recommended if you:  Currently smoke.  Have a history of at least 30 pack-years of smoking and you currently smoke or have quit within  the past 15 years. A pack-year is smoking an average of one pack of cigarettes per day for one year.  Yearly screening should:  Continue until it has been 15 years since you quit.  Stop if you develop a health problem that would prevent you from having lung cancer treatment.  Colorectal Cancer  This type of cancer can be detected and can often be prevented.  Routine colorectal cancer screening usually begins at age 71 and continues through age 76.  If you have risk factors for colon cancer, your health care provider may recommend that you be screened at an earlier age.  If you have a family history of colorectal cancer, talk with your  health care provider about genetic screening.  Your health care provider may also recommend using home test kits to check for hidden blood in your stool.  A small camera at the end of a tube can be used to examine your colon directly (sigmoidoscopy or colonoscopy). This is done to check for the earliest forms of colorectal cancer.  Direct examination of the colon should be repeated every 5-10 years until age 36. However, if early forms of precancerous polyps or small growths are found or if you have a family history or genetic risk for colorectal cancer, you may need to be screened more often.  Skin Cancer  Check your skin from head to toe regularly.  Monitor any moles. Be sure to tell your health care provider: ? About any new moles or changes in moles, especially if there is a change in a mole's shape or color. ? If you have a mole that is larger than the size of a pencil eraser.  If any of your family members has a history of skin cancer, especially at a young age, talk with your health care provider about genetic screening.  Always use sunscreen. Apply sunscreen liberally and repeatedly throughout the day.  Whenever you are outside, protect yourself by wearing long sleeves, pants, a wide-brimmed hat, and sunglasses.  What should I know about osteoporosis? Osteoporosis is a condition in which bone destruction happens more quickly than new bone creation. After menopause, you may be at an increased risk for osteoporosis. To help prevent osteoporosis or the bone fractures that can happen because of osteoporosis, the following is recommended:  If you are 36-23 years old, get at least 1,000 mg of calcium and at least 600 mg of vitamin D per day.  If you are older than age 23 but younger than age 61, get at least 1,200 mg of calcium and at least 600 mg of vitamin D per day.  If you are older than age 3, get at least 1,200 mg of calcium and at least 800 mg of vitamin D per day.  Smoking  and excessive alcohol intake increase the risk of osteoporosis. Eat foods that are rich in calcium and vitamin D, and do weight-bearing exercises several times each week as directed by your health care provider. What should I know about how menopause affects my mental health? Depression may occur at any age, but it is more common as you become older. Common symptoms of depression include:  Low or sad mood.  Changes in sleep patterns.  Changes in appetite or eating patterns.  Feeling an overall lack of motivation or enjoyment of activities that you previously enjoyed.  Frequent crying spells.  Talk with your health care provider if you think that you are experiencing depression. What should I know about immunizations? It  is important that you get and maintain your immunizations. These include:  Tetanus, diphtheria, and pertussis (Tdap) booster vaccine.  Influenza every year before the flu season begins.  Pneumonia vaccine.  Shingles vaccine.  Your health care provider may also recommend other immunizations. This information is not intended to replace advice given to you by your health care provider. Make sure you discuss any questions you have with your health care provider. Document Released: 01/25/2006 Document Revised: 06/22/2016 Document Reviewed: 09/06/2015 Elsevier Interactive Patient Education  2018 [employer].

## 2018-07-08 ENCOUNTER — Other Ambulatory Visit: Payer: Self-pay | Admitting: *Deleted

## 2018-07-08 MED ORDER — FERROUS SULFATE 325 (65 FE) MG PO TBEC: DELAYED_RELEASE_TABLET | ORAL | 2 refills | Status: DC

## 2018-07-14 ENCOUNTER — Encounter: Payer: Self-pay | Admitting: Family Medicine

## 2018-07-15 ENCOUNTER — Other Ambulatory Visit: Payer: Self-pay | Admitting: Family Medicine

## 2018-07-15 DIAGNOSIS — K219 Gastro-esophageal reflux disease without esophagitis: Secondary | ICD-10-CM

## 2018-07-15 MED ORDER — PANTOPRAZOLE SODIUM 20 MG PO TBEC: 20.0000 mg | DELAYED_RELEASE_TABLET | Freq: Every day | ORAL | 1 refills | Status: DC

## 2018-07-17 DIAGNOSIS — F4310 Post-traumatic stress disorder, unspecified: Secondary | ICD-10-CM | POA: Diagnosis not present

## 2018-07-18 ENCOUNTER — Other Ambulatory Visit: Payer: Self-pay | Admitting: Family Medicine

## 2018-07-21 ENCOUNTER — Ambulatory Visit: Payer: Medicare Other | Admitting: Family Medicine

## 2018-07-24 ENCOUNTER — Inpatient Hospital Stay (HOSPITAL_BASED_OUTPATIENT_CLINIC_OR_DEPARTMENT_OTHER): Payer: Medicare Other | Admitting: Hematology and Oncology

## 2018-07-24 ENCOUNTER — Encounter: Payer: Self-pay | Admitting: Urgent Care

## 2018-07-24 ENCOUNTER — Inpatient Hospital Stay: Payer: Medicare Other | Attending: Hematology and Oncology

## 2018-07-24 ENCOUNTER — Encounter: Payer: Self-pay | Admitting: Hematology and Oncology

## 2018-07-24 VITALS — BP 116/74 | HR 70 | Temp 99.4°F | Resp 18 | Wt 252.1 lb

## 2018-07-24 DIAGNOSIS — D509 Iron deficiency anemia, unspecified: Secondary | ICD-10-CM | POA: Diagnosis not present

## 2018-07-24 DIAGNOSIS — E538 Deficiency of other specified B group vitamins: Secondary | ICD-10-CM

## 2018-07-24 LAB — CBC WITH DIFFERENTIAL/PLATELET
Basophils Absolute: 0.1 10*3/uL (ref 0–0.1)
Basophils Relative: 1 %
Eosinophils Absolute: 0.2 10*3/uL (ref 0–0.7)
Eosinophils Relative: 3 %
HCT: 39 % (ref 35.0–47.0)
Hemoglobin: 13.3 g/dL (ref 12.0–16.0)
Lymphocytes Relative: 37 %
Lymphs Abs: 3.3 10*3/uL (ref 1.0–3.6)
MCH: 30.8 pg (ref 26.0–34.0)
MCHC: 34.2 g/dL (ref 32.0–36.0)
MCV: 90.2 fL (ref 80.0–100.0)
Monocytes Absolute: 0.4 10*3/uL (ref 0.2–0.9)
Monocytes Relative: 4 %
Neutro Abs: 4.8 10*3/uL (ref 1.4–6.5)
Neutrophils Relative %: 55 %
Platelets: 262 10*3/uL (ref 150–440)
RBC: 4.32 MIL/uL (ref 3.80–5.20)
RDW: 14.7 % — ABNORMAL HIGH (ref 11.5–14.5)
WBC: 8.7 10*3/uL (ref 3.6–11.0)

## 2018-07-24 LAB — FERRITIN: Ferritin: 58 ng/mL (ref 11–307)

## 2018-07-24 NOTE — Progress Notes (Signed)
[facility] day:  07/24/2018   Chief Complaint: Amanda Webb is a 47 y.o. female with low iron stores who is seen for a 2 month assessment on oral iron.   HPI:  The patient was last seen in the hematology clinic on 06/12/2018.  At that time, she remains fatigued.  Exam was unremarkable. Hemoglobin was 13.7.  Ferritin was 38.  We discussed continuation of oral iron.  B12 had improved from 296 to 502 on oral B12.  Symptomatically, patient remains fatigued. She continues to be "exhausted" and have exertional dyspnea. Patient in her hands has improved. She only has intermittent issues at this point.  Patient has not had any recent headaches.   She is taking oral iron BID with orange juice.  Patient with a flat affect in clinic today. She describes her mood as "not being that good". She is followed by psychiatry and has recently been prescribed Abilify. Patient advising that her insurance has denied the medication. She continues have excessive daytime sleepiness. We have discussed PSG testing in the past to assess for undiagnosed OSAH syndrome.    Past Medical History:  Diagnosis Date  . Allergy   . Anemia   . Anxiety   . Arrhythmia   . Arthritis   . Cervical dysplasia   . Cystitis   . Depression   . GERD (gastroesophageal reflux disease)   . Gross hematuria   . Heart murmur   . HLD (hyperlipidemia)   . Hypertension   . Hypothyroid   . Tobacco abuse     Past Surgical History:  Procedure Laterality Date  . CARPAL TUNNEL RELEASE Bilateral 2011  . FOOT SURGERY Right 2013   Plantar fascia  . TONSILLECTOMY  1979    Family History  Problem Relation Age of Onset  . Depression Mother   . Mental illness Mother   . Alcohol abuse Mother   . Cancer Maternal Grandmother   . Diabetes Maternal Grandmother   . Kidney disease Maternal Grandfather   . Diabetes Father   . Mental illness Father   . Depression Father   . Drug abuse Father    . Depression Paternal Grandfather   . Drug abuse Paternal Grandfather   . Bladder Cancer Neg Hx     Social History:  reports that she has been smoking cigarettes. She has a 15.00 pack-year smoking history. She has never used smokeless tobacco. She reports that she does not drink alcohol or use drugs.  She smokes 1/2 pack/day x 30 years.  Patient denies known exposures to radiation on toxins.  She is caring for her 67 year old grandson.  The patient is alone today.  Allergies:  Allergies  Allergen Reactions  . Bee Venom Anaphylaxis, Hives and Swelling    Carries Epi pen. Carries Epi pen.  . Cat Hair Extract Itching, Other (See Comments) and Swelling    Allergic to trees, nuts, wheat, grass, cats & dogs - itchy watery eyes, swelling. Uses Zyrtec & Flonase & Benadryl if really bad. Used to get allergy shots. Allergic to trees, nuts, wheat, grass, cats & dogs - itchy watery eyes, swelling. Uses Zyrtec & Flonase & Benadryl if really bad. Used to get allergy shots.  . Tetracycline     Other reaction(s): Unknown  . Tetracyclines & Related Nausea And Vomiting  . Lac Bovis Diarrhea and Nausea And Vomiting  . Lactose Diarrhea and Nausea And Vomiting  . Milk Protein Diarrhea and Nausea  And Vomiting  . Tape Other (See Comments) and Rash    Needs to use paper tape. Breaks out with severe rash, pulls skin off when using adhesive.    Current Medications: Current Outpatient Medications  Medication Sig Dispense Refill  . atenolol (TENORMIN) 50 MG tablet Take 1 tablet (50 mg total) by mouth daily. 30 tablet 11  . Cetirizine HCl (ZYRTEC ALLERGY) 10 MG CAPS     . docusate sodium (COLACE) 100 MG capsule Take 1 capsule (100 mg total) by mouth daily. 30 capsule 0  . ferrous sulfate 325 (65 FE) MG EC tablet 1 tablet (325mg ) BID to TID based on tolerance. TAKE WITH SOURCE OF VITAMIN C. 90 tablet 2  . fluticasone (FLONASE) 50 MCG/ACT nasal spray instill 2 sprays into each nostril once daily 16 g 5  .  fluvoxaMINE (LUVOX) 100 MG tablet Take 150 mg by mouth 2 (two) times daily.    Marland Kitchen ipratropium (ATROVENT HFA) 17 MCG/ACT inhaler Inhale 2 puffs into the lungs every 6 (six) hours as needed for wheezing. 1 Inhaler 12  . ketoconazole (NIZORAL) 2 % shampoo apply topically as directed 120 mL 3  . lamoTRIgine (LAMICTAL) 200 MG tablet Take 200 mg by mouth daily.    Marland Kitchen levothyroxine (SYNTHROID, LEVOTHROID) 50 MCG tablet Take 1 tablet (50 mcg total) by mouth every morning. 30 tablet 5  . medroxyPROGESTERone (DEPO-PROVERA) 150 MG/ML injection Inject 1 mL (150 mg total) into the muscle every 3 (three) months. 1 mL 2  . pantoprazole (PROTONIX) 20 MG tablet Take 1 tablet (20 mg total) by mouth daily. 30 tablet 1  . QUEtiapine (SEROQUEL) 50 MG tablet Take 50 mg by mouth 3 (three) times daily.    . SUMAtriptan (IMITREX) 5 MG/ACT nasal spray Place 1 spray (5 mg total) every 2 (two) hours as needed into the nose for migraine. Max 3 doses in 24 hours 1 Inhaler 0  . VENTOLIN HFA 108 (90 Base) MCG/ACT inhaler inhale 1 to 2 puffs by mouth every 4 to 6 hours if needed for shortness of breath or wheezing 18 g 1  . ARIPiprazole (ABILIFY) 2 MG tablet Take 1 tablet (2 mg total) by mouth daily.     No current facility-administered medications for this visit.     Review of Systems  Constitutional: Positive for malaise/fatigue and weight loss (1 pound). Negative for chills, diaphoresis and fever.       Feels "ok".  HENT: Positive for sore throat. Negative for congestion, ear discharge, ear pain, nosebleeds, sinus pain and tinnitus.   Eyes: Negative.  Negative for blurred vision, double vision, photophobia, pain, discharge and redness.  Respiratory: Negative for cough, hemoptysis and sputum production. Shortness of breath: some exertional.        No interval sleep apnea testing.  Cardiovascular: Negative.  Negative for chest pain, palpitations, orthopnea, leg swelling and PND.  Gastrointestinal: Negative for abdominal pain,  blood in stool, constipation, diarrhea, melena, nausea and vomiting.  Genitourinary: Negative for dysuria, frequency, hematuria and urgency.       Urinary incontinence. No vaginal bleeding.  Musculoskeletal: Positive for joint pain (arthritis in hands). Negative for back pain, falls and myalgias.       On and off pain in hands due to weather.  Skin: Negative for itching and rash.  Neurological: Positive for headaches (chronic- off/on). Negative for dizziness, tremors, sensory change, focal weakness and weakness.  Endo/Heme/Allergies: Does not bruise/bleed easily.       HYPOthyroidism on levothyroxine  Psychiatric/Behavioral:  Positive for depression. Negative for memory loss (poor memory) and suicidal ideas. The patient is nervous/anxious (irritability). The patient does not have insomnia.   All other systems reviewed and are negative.  Performance status (ECOG): 1-2   Physical Exam: Blood pressure 116/74, pulse 70, temperature 99.4 F (37.4 C), temperature source Tympanic, resp. rate 18, weight 252 lb 2 oz (114.4 kg). GENERAL:  Well developed, well nourished, woman sitting comfortably in the exam room in no acute distress. MENTAL STATUS:  Alert and oriented to person, place and time. HEAD:  Blonde hair pulled back.  Normocephalic, atraumatic, face symmetric, no Cushingoid features. EYES:  Blue eyes.  Pupils equal round and reactive to light and accomodation.  No conjunctivitis or scleral icterus. ENT:  Oropharynx clear without lesion.  Tongue normal. Mucous membranes moist.  RESPIRATORY:  Clear to auscultation without rales, wheezes or rhonchi. CARDIOVASCULAR:  Regular rate and rhythm without murmur, rub or gallop. ABDOMEN:  Soft, non-tender, with active bowel sounds, and no hepatosplenomegaly.  No masses. SKIN:  No rashes, ulcers or lesions. EXTREMITIES: No edema, no skin discoloration or tenderness.  No palpable cords. LYMPH NODES: No palpable cervical, supraclavicular, axillary or  inguinal adenopathy  NEUROLOGICAL: Unremarkable. PSYCH:  Appropriate.  Flat affect.   Appointment on 07/24/2018  Component Date Value Ref Range Status  . WBC 07/24/2018 8.7  3.6 - 11.0 K/uL Final  . RBC 07/24/2018 4.32  3.80 - 5.20 MIL/uL Final  . Hemoglobin 07/24/2018 13.3  12.0 - 16.0 g/dL Final  . HCT 07/24/2018 39.0  35.0 - 47.0 % Final  . MCV 07/24/2018 90.2  80.0 - 100.0 fL Final  . MCH 07/24/2018 30.8  26.0 - 34.0 pg Final  . MCHC 07/24/2018 34.2  32.0 - 36.0 g/dL Final  . RDW 07/24/2018 14.7* 11.5 - 14.5 % Final  . Platelets 07/24/2018 262  150 - 440 K/uL Final  . Neutrophils Relative % 07/24/2018 55  % Final  . Neutro Abs 07/24/2018 4.8  1.4 - 6.5 K/uL Final  . Lymphocytes Relative 07/24/2018 37  % Final  . Lymphs Abs 07/24/2018 3.3  1.0 - 3.6 K/uL Final  . Monocytes Relative 07/24/2018 4  % Final  . Monocytes Absolute 07/24/2018 0.4  0.2 - 0.9 K/uL Final  . Eosinophils Relative 07/24/2018 3  % Final  . Eosinophils Absolute 07/24/2018 0.2  0 - 0.7 K/uL Final  . Basophils Relative 07/24/2018 1  % Final  . Basophils Absolute 07/24/2018 0.1  0 - 0.1 K/uL Final   Performed at [facility], [address], [place], [place] [address]    Assessment:  Raziyah Vanvleck is a 47 y.o. female with a mild normocytic anemia.  She has iron deficiency.  Hemoglobin was normal until 03/12/2018.  She notes scant vaginal bleeding on DepoProvera.  She denies any hematuria, melena or hematochezia.  Diet appears good.  She has ice pica.  CBC on 03/12/2018 revealed a hematocrit of 32.7, hemoglobin 11.2, and MCV 82.4.  Normal studies included:  Creatinine, LFTs, TSH and free T4.  Ferritin was 11.  Iron saturation was 8% and TIBC 362.  Work-up on 03/24/2018 revealed a hematocrit of 35.7, hemoglobin 11.9, and MCV 84.2.  Folate was 17.2.  B12 was 296 (low normal).  MMA was normal.  Intrinsic factor antibody and antiparietal cell antibody was normal on 06/12/2018.  Retic was 1.3%.  Guaiac cards  were negative x 3.  She began oral B12 on 04/13/2018.  B12 was 502 on 06/12/2018.  Ferritin has been followed: 8 on 04/28/2012, 20 on 09/03/2012, 18 on 09/28/2013, 11 on 03/12/2018, 38 on 06/11/2018, and 58 on 07/24/2018.  She is on oral iron BID.  Symptomatically, she feels "ok".  Exam is stable.  Hemoglobin is 13.3.  Ferritin is 58.  Plan: 1. Labs today:  CBC with diff, ferritin.   2. Iron deficiency anemia:  Continue oral ron BID with OJ.  Ferritin goal is 100. 3. B12 deficiency:  Continue oral B12. 4. Sleep apnea:  Refer to [facility] for PSG testing.   5. RTC in 6 months for MD assessment and labs (CBC with diff, ferritin - day before)   Honor Loh, NP 07/24/2018, 3:55 PM   I saw and evaluated the patient, participating in the key portions of the service and reviewing pertinent diagnostic studies and records.  I reviewed the nurse practitioner's note and agree with the findings and the plan.  The assessment and plan were discussed with the patient.  A few questions were asked by the patient and answered.   Nolon Stalls, MD 07/24/2018, 3:55 PM

## 2018-07-24 NOTE — Progress Notes (Signed)
Patient offers no complaints today. 

## 2018-07-31 ENCOUNTER — Other Ambulatory Visit: Payer: Self-pay | Admitting: Family Medicine

## 2018-07-31 MED ORDER — ALBUTEROL SULFATE HFA 108 (90 BASE) MCG/ACT IN AERS: INHALATION_SPRAY | RESPIRATORY_TRACT | 1 refills | Status: DC

## 2018-07-31 NOTE — Addendum Note (Signed)
Addended by: Frederich Cha D on: 07/31/2018 02:27 PM   Modules accepted: Orders

## 2018-08-01 DIAGNOSIS — G4730 Sleep apnea, unspecified: Secondary | ICD-10-CM | POA: Diagnosis not present

## 2018-08-03 ENCOUNTER — Other Ambulatory Visit: Payer: Self-pay | Admitting: Family Medicine

## 2018-08-03 DIAGNOSIS — E039 Hypothyroidism, unspecified: Secondary | ICD-10-CM

## 2018-08-28 NOTE — Progress Notes (Deleted)
Date last pap: 04/02/17 Last Depo-Provera: 06/13/18 Side Effects if any: NA Serum HCG indicated? NA Depo-Provera 150 mg IM given by: Elmer Picker, [place] Next appointment due 11/13/18-12/12-19

## 2018-08-29 ENCOUNTER — Ambulatory Visit: Payer: Medicare Other | Admitting: Internal Medicine

## 2018-08-29 ENCOUNTER — Other Ambulatory Visit: Payer: Self-pay

## 2018-08-29 ENCOUNTER — Ambulatory Visit: Payer: Medicare Other

## 2018-08-29 ENCOUNTER — Ambulatory Visit (INDEPENDENT_AMBULATORY_CARE_PROVIDER_SITE_OTHER): Payer: Medicare Other | Admitting: Obstetrics and Gynecology

## 2018-08-29 VITALS — BP 125/82 | HR 98 | Ht 66.5 in | Wt 249.0 lb

## 2018-08-29 DIAGNOSIS — Z3042 Encounter for surveillance of injectable contraceptive: Secondary | ICD-10-CM

## 2018-08-29 DIAGNOSIS — F4310 Post-traumatic stress disorder, unspecified: Secondary | ICD-10-CM | POA: Diagnosis not present

## 2018-08-29 MED ORDER — MEDROXYPROGESTERONE ACETATE 150 MG/ML IM SUSP
150.0000 mg | Freq: Once | INTRAMUSCULAR | Status: AC
Start: 2018-08-29 — End: 2018-08-29
  Administered 2018-08-29: 150 mg via INTRAMUSCULAR

## 2018-08-29 NOTE — Progress Notes (Signed)
Date last pap: na Last Depo-Provera: 06/13/18 Side Effects if any: na Serum HCG indicated? na Depo-Provera 150 mg IM given by: Elmer Picker, [place] Next appointment due 11/13/18-11/27/18  BP 125/82   Pulse 98   Ht 5' 6.5" (1.689 m)   Wt 249 lb (112.9 kg)   BMI 39.59 kg/m

## 2018-09-05 ENCOUNTER — Other Ambulatory Visit: Payer: Self-pay | Admitting: Family Medicine

## 2018-09-05 DIAGNOSIS — J309 Allergic rhinitis, unspecified: Secondary | ICD-10-CM

## 2018-09-09 DIAGNOSIS — K219 Gastro-esophageal reflux disease without esophagitis: Secondary | ICD-10-CM

## 2018-09-10 ENCOUNTER — Other Ambulatory Visit: Payer: Self-pay | Admitting: Family Medicine

## 2018-09-10 DIAGNOSIS — K219 Gastro-esophageal reflux disease without esophagitis: Secondary | ICD-10-CM

## 2018-09-10 MED ORDER — PANTOPRAZOLE SODIUM 20 MG PO TBEC: 20.0000 mg | DELAYED_RELEASE_TABLET | Freq: Every day | ORAL | 1 refills | Status: DC

## 2018-09-19 ENCOUNTER — Ambulatory Visit (INDEPENDENT_AMBULATORY_CARE_PROVIDER_SITE_OTHER): Payer: Medicare Other

## 2018-09-19 DIAGNOSIS — Z23 Encounter for immunization: Secondary | ICD-10-CM | POA: Diagnosis not present

## 2018-09-25 ENCOUNTER — Encounter: Payer: Self-pay | Admitting: Hematology and Oncology

## 2018-10-12 ENCOUNTER — Encounter: Payer: Self-pay | Admitting: Hematology and Oncology

## 2018-10-20 ENCOUNTER — Ambulatory Visit (INDEPENDENT_AMBULATORY_CARE_PROVIDER_SITE_OTHER): Payer: Medicare Other | Admitting: Family Medicine

## 2018-10-20 ENCOUNTER — Encounter: Payer: Self-pay | Admitting: Family Medicine

## 2018-10-20 VITALS — BP 121/65 | HR 87 | Temp 98.9°F | Resp 16 | Ht 66.0 in | Wt 253.0 lb

## 2018-10-20 DIAGNOSIS — J410 Simple chronic bronchitis: Secondary | ICD-10-CM | POA: Diagnosis not present

## 2018-10-20 DIAGNOSIS — J209 Acute bronchitis, unspecified: Secondary | ICD-10-CM

## 2018-10-20 MED ORDER — AZITHROMYCIN 250 MG PO TABS: ORAL_TABLET | ORAL | 0 refills | Status: DC

## 2018-10-20 MED ORDER — PREDNISONE 50 MG PO TABS: 50.0000 mg | ORAL_TABLET | Freq: Every day | ORAL | 0 refills | Status: DC

## 2018-10-20 NOTE — Progress Notes (Signed)
Subjective:    Patient ID: Amanda Webb, female    DOB: 1971/12/08, 47 y.o.   MRN: [id number]  Amanda Webb is a 47 y.o. female presenting on 10/20/2018 for Cough (can't sleep at night SOB sinus congestion onset 10 days )   HPI   ACUTE BRONCHITIS / BRONCHOSPASM COUGHING - POSSIBLE COPD Reports recent course with sick contact recently with son having "non group A strep" based on rapid strep neg and culture results, she developed some initial sick symptoms, and then slight improve now symptoms of coughing without improvement, seems worsening. She has history of prior flare up with similar symptoms in past 09/2017 was given albuterol rescue inhaler at that time. - Taking OTC Mucinex temporarily loosening mucus - Using Albuterol inhaler multiple times a day up to 3, with relief only temporary but it is helpful, she has an existing nebulizer machine at home - Still taking Flonase, also Tylenol regularly Denies fevers chills sweats, nausea vomiting chest pain or pressure  Health Maintenance: UTD Flu Vaccine 09/19/18 already  Depression screen [facility] 2/9 10/20/2018 09/26/2016  Decreased Interest 0 0  Down, Depressed, Hopeless 0 0  PHQ - 2 Score 0 0    Social History   Tobacco Use  . Smoking status: Current Every Day Smoker    Packs/day: 0.50    Years: 30.00    Pack years: 15.00    Types: Cigarettes  . Smokeless tobacco: Never Used  Substance Use Topics  . Alcohol use: No  . Drug use: No    Review of Systems Per HPI unless specifically indicated above     Objective:    BP 121/65   Pulse 87   Temp 98.9 F (37.2 C) (Oral)   Resp 16   Ht 5\' 6"  (1.676 m)   Wt 253 lb (114.8 kg)   SpO2 97%   BMI 40.84 kg/m   Wt Readings from Last 3 Encounters:  10/20/18 253 lb (114.8 kg)  08/29/18 249 lb (112.9 kg)  07/24/18 252 lb 2 oz (114.4 kg)    Physical Exam  Constitutional: She is oriented to person, place, and time. She appears well-developed and well-nourished. No  distress.  Mildly ill appearing with coughing, cooperative, obese  HENT:  Head: Normocephalic and atraumatic.  Mouth/Throat: Oropharynx is clear and moist.  Frontal / maxillary sinuses non-tender. Nares patent without purulence or edema. Bilateral TMs clear without erythema, effusion or bulging. Oropharynx clear without erythema, exudates, edema or asymmetry.  Eyes: Conjunctivae are normal. Right eye exhibits no discharge. Left eye exhibits no discharge.  Neck: Normal range of motion. Neck supple.  Cardiovascular: Normal rate, regular rhythm, normal heart sounds and intact distal pulses.  No murmur heard. Pulmonary/Chest: Effort normal. No respiratory distress. She has wheezes. She has no rales.  Diffuse mild coarse breath sounds non focal with some exp wheezing. Frequent tight coughing spells.  Lymphadenopathy:    She has no cervical adenopathy.  Neurological: She is alert and oriented to person, place, and time.  Skin: Skin is warm and dry. No rash noted. She is not diaphoretic. No erythema.  Psychiatric: Her behavior is normal.  Well groomed, good eye contact, normal speech and thoughts  Nursing note and vitals reviewed.  Results for orders placed or performed in visit on 07/24/18  CBC with Differential  Result Value Ref Range   WBC 8.7 3.6 - 11.0 K/uL   RBC 4.32 3.80 - 5.20 MIL/uL   Hemoglobin 13.3 12.0 - 16.0 g/dL  HCT 39.0 35.0 - 47.0 %   MCV 90.2 80.0 - 100.0 fL   MCH 30.8 26.0 - 34.0 pg   MCHC 34.2 32.0 - 36.0 g/dL   RDW 14.7 (H) 11.5 - 14.5 %   Platelets 262 150 - 440 K/uL   Neutrophils Relative % 55 %   Neutro Abs 4.8 1.4 - 6.5 K/uL   Lymphocytes Relative 37 %   Lymphs Abs 3.3 1.0 - 3.6 K/uL   Monocytes Relative 4 %   Monocytes Absolute 0.4 0.2 - 0.9 K/uL   Eosinophils Relative 3 %   Eosinophils Absolute 0.2 0 - 0.7 K/uL   Basophils Relative 1 %   Basophils Absolute 0.1 0 - 0.1 K/uL  Ferritin  Result Value Ref Range   Ferritin 58 11 - 307 ng/mL        Assessment & Plan:   Problem List Items Addressed This Visit    Simple chronic bronchitis ([place])    Other Visit Diagnoses    Acute bronchitis with bronchospasm    -  Primary   Relevant Medications   predniSONE (DELTASONE) 50 MG tablet   azithromycin (ZITHROMAX Z-PAK) 250 MG tablet      Consistent with mild acute exacerbation of Bronchitis w/ Bronchospasm vs Mild COPD with worsening cough, some productive, mostly tight with bronchospasm. Last similar exacerbation 09/2017' - No hypoxia (97% on RA), afebrile, no recent hospitalization - Continues Albuterol PRN not on maintenance - with some relief, has not had form PFTs or Pulm eval  Deferred strep testing, despite sick contact. Throat exam unremarkable.  Plan: 1. Start Prednisone 50mg  x 5 day steroid burst - as well as Azithromycin Z-pak dosing 5 days 2. Use albuterol q 4 hr regularly x 2-3 days  Future consider Pulmonology for PFTs or other evaluation if has too frequent bronchospasm episodes more consistent with COPD, to be determined. May only be intermittent episodes due to smoking / respiratory virus.  RTC about 1 week if not improving, otherwise strict return criteria to go to ED - consider future X-ray if indicated not improving   Meds ordered this encounter  Medications  . predniSONE (DELTASONE) 50 MG tablet    Sig: Take 1 tablet (50 mg total) by mouth daily with breakfast.    Dispense:  5 tablet    Refill:  0  . azithromycin (ZITHROMAX Z-PAK) 250 MG tablet    Sig: Take 2 tabs (500mg  total) on Day 1. Take 1 tab (250mg ) daily for next 4 days.    Dispense:  6 tablet    Refill:  0    Follow up plan: Return in about 1 week (around 10/27/2018), or if symptoms worsen or fail to improve, for bronchitis.  [name], [facility] 10/20/2018, 3:11 PM

## 2018-10-20 NOTE — Patient Instructions (Addendum)
Thank you for coming to the office today.  1. It sounds like you had an Upper Respiratory Virus that has settled into a Bronchitis, lower respiratory tract infection. I don't have concerns for pneumonia today, and think that this should gradually improve. Once you are feeling better, the cough may take a few weeks to fully resolve. I do hear wheezing and coarse breath sounds, with bronchospasm  Unlikely strep. Not concerned for pneumonia today - but may reconsider if not improving as discussed  Start Azithromycin Z pak (antibiotic) 2 tabs day 1, then 1 tab x 4 days, complete entire course even if improved  - Start Prednisone 50mg  daily for next 5 days - this will open up lungs allow you to breath better and treat that wheezing or bronchospasm - Use Albuterol inhaler 2 puffs every 4-6 hours around the clock for next 2-3 days, max up to 5 days then use as needed  - Use nasal saline (Simply Saline or Ocean Spray) to flush nasal congestion multiple times a day, may help cough - Drink plenty of fluids to improve congestion  Finish mucinex as planned for up to 1 week  If your symptoms seem to worsen instead of improve over next several days, including significant fever / chills, worsening shortness of breath, worsening wheezing, or nausea / vomiting and can't take medicines - return sooner or go to hospital Emergency Department for more immediate treatment.   Please schedule a Follow-up Appointment to: Return in about 1 week (around 10/27/2018), or if symptoms worsen or fail to improve, for bronchitis.  If you have any other questions or concerns, please feel free to call the office or send a message through [place]. You may also schedule an earlier appointment if necessary.  Additionally, you may be receiving a survey about your experience at our office within a few days to 1 week by e-mail or mail. We value your feedback.  [name], DO [place]

## 2018-10-21 ENCOUNTER — Ambulatory Visit (INDEPENDENT_AMBULATORY_CARE_PROVIDER_SITE_OTHER): Payer: Medicare Other | Admitting: Obstetrics and Gynecology

## 2018-10-21 ENCOUNTER — Other Ambulatory Visit (HOSPITAL_COMMUNITY)
Admission: RE | Admit: 2018-10-21 | Discharge: 2018-10-21 | Disposition: A | Discharge status: Home / Self Care | Payer: Medicare Other | Source: Ambulatory Visit | Attending: Obstetrics and Gynecology | Admitting: Obstetrics and Gynecology

## 2018-10-21 ENCOUNTER — Other Ambulatory Visit: Payer: Self-pay | Admitting: Family Medicine

## 2018-10-21 ENCOUNTER — Encounter: Payer: Self-pay | Admitting: Obstetrics and Gynecology

## 2018-10-21 VITALS — BP 105/65 | HR 80 | Temp 98.5°F | Ht 66.0 in | Wt 255.5 lb

## 2018-10-21 DIAGNOSIS — Z202 Contact with and (suspected) exposure to infections with a predominantly sexual mode of transmission: Secondary | ICD-10-CM | POA: Diagnosis not present

## 2018-10-21 DIAGNOSIS — B3731 Acute candidiasis of vulva and vagina: Secondary | ICD-10-CM

## 2018-10-21 DIAGNOSIS — N3946 Mixed incontinence: Secondary | ICD-10-CM | POA: Insufficient documentation

## 2018-10-21 DIAGNOSIS — B373 Candidiasis of vulva and vagina: Secondary | ICD-10-CM

## 2018-10-21 DIAGNOSIS — N8110 Cystocele, unspecified: Secondary | ICD-10-CM | POA: Insufficient documentation

## 2018-10-21 MED ORDER — NYSTATIN-TRIAMCINOLONE 100000-0.1 UNIT/GM-% EX CREA: 1.0000 "application " | TOPICAL_CREAM | Freq: Two times a day (BID) | CUTANEOUS | 0 refills | Status: DC

## 2018-10-21 NOTE — Progress Notes (Signed)
GYN ENCOUNTER NOTE  Subjective:       Amanda Webb is a 47 y.o. [id number] female is here for gynecologic evaluation of the following issues:  1.  STD exposure.    After 6 years of celibacy, patient had a sexual encounter where condoms were/were not used.  She reports recent urine leakage with vulvar irritation.  She reports no abnormal uterine bleeding or pelvic pain.  She denies significant vaginal discharge. Bowel function is normal. Bladder function is normal. Contraception-Depo-Provera. She has been in contact with her partners girlfriend (she was unaware that her partner had a girlfriend).  Gynecologic History No LMP recorded (lmp unknown). Patient has had an injection.  Obstetric History OB History  Gravida Para Term Preterm AB Living  5 4 4   1 4   SAB TAB Ectopic Multiple Live Births    1     4    # Outcome Date GA Lbr Len/2nd Weight Sex Delivery Anes PTL Lv  5 Term 1996   9 lb 9.6 oz (4.355 kg) F Vag-Spont   LIV  4 TAB 1996          3 Term 1994   7 lb 8 oz (3.402 kg) M Vag-Spont   LIV  2 Term 1992   7 lb 8 oz (3.402 kg) F Vag-Spont   LIV  1 Term 1990   7 lb 8 oz (3.402 kg) M Vag-Spont   LIV    Obstetric Comments  a    Past Medical History:  Diagnosis Date  . Allergy   . Anemia   . Anxiety   . Arrhythmia   . Arthritis   . Cervical dysplasia   . Cystitis   . Depression   . GERD (gastroesophageal reflux disease)   . Gross hematuria   . Heart murmur   . HLD (hyperlipidemia)   . Hypertension   . Hypothyroid   . Tobacco abuse     Past Surgical History:  Procedure Laterality Date  . CARPAL TUNNEL RELEASE Bilateral 2011  . FOOT SURGERY Right 2013   Plantar fascia  . TONSILLECTOMY  1979    Current Outpatient Medications on File Prior to Visit  Medication Sig Dispense Refill  . albuterol (VENTOLIN HFA) 108 (90 Base) MCG/ACT inhaler inhale 1 to 2 puffs by mouth every 4 to 6 hours if needed for shortness of breath or wheezing 18 g 1  . ARIPiprazole  (ABILIFY) 2 MG tablet Take 1 tablet (2 mg total) by mouth daily.    Marland Kitchen atenolol (TENORMIN) 50 MG tablet Take 1 tablet (50 mg total) by mouth daily. 30 tablet 11  . azithromycin (ZITHROMAX Z-PAK) 250 MG tablet Take 2 tabs (500mg  total) on Day 1. Take 1 tab (250mg ) daily for next 4 days. 6 tablet 0  . Cetirizine HCl (ZYRTEC ALLERGY) 10 MG CAPS     . docusate sodium (COLACE) 100 MG capsule Take 1 capsule (100 mg total) by mouth daily. 30 capsule 0  . ferrous sulfate 325 (65 FE) MG EC tablet 1 tablet (325mg ) BID to TID based on tolerance. TAKE WITH SOURCE OF VITAMIN C. 90 tablet 2  . fluticasone (FLONASE) 50 MCG/ACT nasal spray USE 2 SPRAYS IN EACH NOSTRIL ONCE DAILY 48 g 1  . fluvoxaMINE (LUVOX) 100 MG tablet Take 150 mg by mouth 2 (two) times daily.    Marland Kitchen ketoconazole (NIZORAL) 2 % shampoo apply topically as directed 120 mL 3  . lamoTRIgine (LAMICTAL) 200 MG tablet Take 200 mg  by mouth daily.    Marland Kitchen levothyroxine (SYNTHROID, LEVOTHROID) 50 MCG tablet TAKE 1 TABLET BY MOUTH EVERY MORNING 30 tablet 5  . medroxyPROGESTERone (DEPO-PROVERA) 150 MG/ML injection Inject 1 mL (150 mg total) into the muscle every 3 (three) months. 1 mL 2  . pantoprazole (PROTONIX) 20 MG tablet Take 1 tablet (20 mg total) by mouth daily. 30 tablet 1  . predniSONE (DELTASONE) 50 MG tablet Take 1 tablet (50 mg total) by mouth daily with breakfast. 5 tablet 0  . QUEtiapine (SEROQUEL) 50 MG tablet Take 50 mg by mouth 3 (three) times daily.    . SUMAtriptan (IMITREX) 5 MG/ACT nasal spray Place 1 spray (5 mg total) every 2 (two) hours as needed into the nose for migraine. Max 3 doses in 24 hours 1 Inhaler 0   No current facility-administered medications on file prior to visit.     Allergies  Allergen Reactions  . Bee Venom Anaphylaxis, Hives and Swelling    Carries Epi pen. Carries Epi pen.  . Cat Hair Extract Itching, Other (See Comments) and Swelling    Allergic to trees, nuts, wheat, grass, cats & dogs - itchy watery eyes,  swelling. Uses Zyrtec & Flonase & Benadryl if really bad. Used to get allergy shots. Allergic to trees, nuts, wheat, grass, cats & dogs - itchy watery eyes, swelling. Uses Zyrtec & Flonase & Benadryl if really bad. Used to get allergy shots.  . Tetracycline     Other reaction(s): Unknown  . Tetracyclines & Related Nausea And Vomiting  . Lac Bovis Diarrhea and Nausea And Vomiting  . Lactose Diarrhea and Nausea And Vomiting  . Milk Protein Diarrhea and Nausea And Vomiting  . Tape Other (See Comments) and Rash    Needs to use paper tape. Breaks out with severe rash, pulls skin off when using adhesive.    Social History   Socioeconomic History  . Marital status: Divorced    Spouse name: Not on file  . Number of children: Not on file  . Years of education: Some college  . Highest education level: Not on file  Occupational History  . Not on file  Social Needs  . Financial resource strain: Not on file  . Food insecurity:    Worry: Not on file    Inability: Not on file  . Transportation needs:    Medical: Not on file    Non-medical: Not on file  Tobacco Use  . Smoking status: Current Every Day Smoker    Packs/day: 0.50    Years: 30.00    Pack years: 15.00    Types: Cigarettes  . Smokeless tobacco: Never Used  Substance and Sexual Activity  . Alcohol use: No  . Drug use: No  . Sexual activity: Yes    Birth control/protection: Condom, Injection  Lifestyle  . Physical activity:    Days per week: 0 days    Minutes per session: 0 min  . Stress: Not on file  Relationships  . Social connections:    Talks on phone: Not on file    Gets together: Not on file    Attends religious service: Not on file    Active member of club or organization: Not on file    Attends meetings of clubs or organizations: Not on file    Relationship status: Not on file  . Intimate partner violence:    Fear of current or ex partner: Not on file    Emotionally abused: Not on file  Physically abused:  Not on file    Forced sexual activity: Not on file  Other Topics Concern  . Not on file  Social History Narrative  . Not on file    Family History  Problem Relation Age of Onset  . Depression Mother   . Mental illness Mother   . Alcohol abuse Mother   . Cancer Maternal Grandmother   . Diabetes Maternal Grandmother   . Kidney disease Maternal Grandfather   . Diabetes Father   . Mental illness Father   . Depression Father   . Drug abuse Father   . Depression Paternal Grandfather   . Drug abuse Paternal Grandfather   . Bladder Cancer Neg Hx     The following portions of the patient's history were reviewed and updated as appropriate: allergies, current medications, past family history, past medical history, past social history, past surgical history and problem list.  Review of Systems Review of Systems Review of Systems  Constitutional: Negative for chills, diaphoresis and fever.  HENT: Negative for sore throat.   Gastrointestinal: Negative for abdominal pain, constipation, diarrhea, nausea and vomiting.  Genitourinary: Negative for dysuria, flank pain, frequency and urgency.  Musculoskeletal: Negative for myalgias.  Skin: Positive for rash.       Vulvar irritation  All other systems reviewed and are negative.   Objective:   BP 105/65   Pulse 80   Temp 98.5 F (36.9 C)   Ht 5\' 6"  (1.676 m)   Wt 255 lb 8 oz (115.9 kg)   LMP  (LMP Unknown)   BMI 41.24 kg/m  CONSTITUTIONAL: Well-developed, well-nourished female in no acute distress.  HENT:  Normocephalic, atraumatic.  NECK:  SKIN: Skin is warm and dry. Not diaphoretic. No erythema. No pallor. [place]: Alert and oriented to person, place, and time. PSYCHIATRIC: Normal mood and affect. Normal behavior. Normal judgment and thought content. CARDIOVASCULAR:Not Examined RESPIRATORY: Not Examined BREASTS: Not Examined ABDOMEN: Soft, non distended; Non tender.  No Organomegaly. BACK: No CVA tenderness BLADDER:  Nontender PELVIC:  External Genitalia: Hyperemia of the labia majora and of the  labial crural fold bilaterally  BUS: Normal  Vagina: Normal; no discharge  Cervix: Normal; no lesions; no discharge; mild cervical motion tenderness  Uterus: Normal size, shape,consistency, mobile, nontender  Adnexa: Normal; nonpalpable and nontender  RV: Normal external exam MUSCULOSKELETAL: Normal range of motion. No tenderness.  No cyanosis, clubbing, or edema.     Assessment:   1. Possible exposure to STD - Hepatitis B surface antigen - Hepatitis C antibody - RPR - HIV Antibody (routine testing w rflx) - HSV(herpes simplex vrs) 1+2 ab-IgG - Cervicovaginal ancillary only  2. Monilial vulvitis     Plan:   1.  GC/CT cervical swab 2.  STD testing-HIV, RPR, hepatitis B surface antigen, hepatitis C antibody, HSV antibodies 3.  Testing results will be made available 4.  Return in 6 months for repeat testing  Brayton Mars, MD  Note: This dictation was prepared with Dragon dictation along with smaller phrase technology. Any transcriptional errors that result from this process are unintentional.

## 2018-10-21 NOTE — Patient Instructions (Signed)
1.  STD screening is done today-both blood and cervical cultures. 2.  Mycolog cream is prescribed for vulvar irritation likely secondary to monilia vulvitis.  Apply topically twice a day for 10 to 14 days. 3.  Results from testing will be available through my chart. 4.  Return in 6 months for repeat STD screening.

## 2018-10-22 ENCOUNTER — Encounter: Payer: Self-pay | Admitting: Hematology and Oncology

## 2018-10-22 ENCOUNTER — Other Ambulatory Visit: Payer: Self-pay

## 2018-10-22 LAB — HIV ANTIBODY (ROUTINE TESTING W REFLEX): HIV Screen 4th Generation wRfx: NONREACTIVE

## 2018-10-22 LAB — RPR: RPR Ser Ql: NONREACTIVE

## 2018-10-22 LAB — CERVICOVAGINAL ANCILLARY ONLY
Chlamydia: NEGATIVE
Neisseria Gonorrhea: NEGATIVE
Trichomonas: NEGATIVE

## 2018-10-22 LAB — HEPATITIS C ANTIBODY: Hep C Virus Ab: <0.1 s/co ratio (ref 0.0–0.9)

## 2018-10-22 LAB — HSV(HERPES SIMPLEX VRS) I + II AB-IGG
HSV 1 Glycoprotein G Ab, IgG: 1.61 index — ABNORMAL HIGH (ref 0.00–0.90)
HSV 2 IgG, Type Spec: 12.9 index — ABNORMAL HIGH (ref 0.00–0.90)

## 2018-10-22 LAB — HEPATITIS B SURFACE ANTIGEN: Hepatitis B Surface Ag: NEGATIVE

## 2018-10-22 MED ORDER — NYSTATIN-TRIAMCINOLONE 100000-0.1 UNIT/GM-% EX CREA: 1.0000 "application " | TOPICAL_CREAM | Freq: Two times a day (BID) | CUTANEOUS | 0 refills | Status: DC

## 2018-10-23 ENCOUNTER — Other Ambulatory Visit: Payer: Self-pay

## 2018-10-23 MED ORDER — TRIAMCINOLONE ACETONIDE 0.1 % EX OINT: 1.0000 "application " | TOPICAL_OINTMENT | Freq: Two times a day (BID) | CUTANEOUS | 0 refills | Status: DC

## 2018-10-23 MED ORDER — NYSTATIN 100000 UNIT/GM EX OINT: 1.0000 "application " | TOPICAL_OINTMENT | Freq: Two times a day (BID) | CUTANEOUS | 0 refills | Status: DC

## 2018-10-31 DIAGNOSIS — F4310 Post-traumatic stress disorder, unspecified: Secondary | ICD-10-CM | POA: Diagnosis not present

## 2018-11-06 ENCOUNTER — Other Ambulatory Visit: Payer: Self-pay | Admitting: Urgent Care

## 2018-11-07 ENCOUNTER — Other Ambulatory Visit: Payer: Self-pay | Admitting: Family Medicine

## 2018-11-07 DIAGNOSIS — K219 Gastro-esophageal reflux disease without esophagitis: Secondary | ICD-10-CM

## 2018-11-12 ENCOUNTER — Other Ambulatory Visit: Payer: Self-pay | Admitting: Family Medicine

## 2018-11-12 ENCOUNTER — Ambulatory Visit (INDEPENDENT_AMBULATORY_CARE_PROVIDER_SITE_OTHER): Payer: Medicare Other | Admitting: Obstetrics and Gynecology

## 2018-11-12 VITALS — BP 125/79 | HR 79 | Ht 66.0 in | Wt 256.6 lb

## 2018-11-12 DIAGNOSIS — Z3042 Encounter for surveillance of injectable contraceptive: Secondary | ICD-10-CM

## 2018-11-12 MED ORDER — MEDROXYPROGESTERONE ACETATE 150 MG/ML IM SUSP
150.0000 mg | Freq: Once | INTRAMUSCULAR | Status: AC
  Administered 2018-11-12: 150 mg via INTRAMUSCULAR

## 2018-11-12 NOTE — Progress Notes (Signed)
Date last pap: na. Last Depo-Provera: 08/29/18. Side Effects if any: na. Serum HCG indicated?na. Depo-Provera 150 mg IM given by: [facility] CMA. Next appointment due 01/28/18-02/11/18   BP 125/79   Pulse 79   Ht 5\' 6"  (1.676 m)   Wt 256 lb 9.6 oz (116.4 kg)   LMP  (LMP Unknown)   BMI 41.42 kg/m

## 2018-11-14 ENCOUNTER — Ambulatory Visit: Payer: Medicare Other

## 2018-11-18 ENCOUNTER — Other Ambulatory Visit: Payer: Self-pay | Admitting: Urgent Care

## 2018-11-18 ENCOUNTER — Ambulatory Visit: Payer: Medicare Other | Admitting: Urology

## 2018-11-18 ENCOUNTER — Encounter: Payer: Self-pay | Admitting: Hematology and Oncology

## 2018-11-18 DIAGNOSIS — E538 Deficiency of other specified B group vitamins: Secondary | ICD-10-CM

## 2018-11-18 DIAGNOSIS — R7989 Other specified abnormal findings of blood chemistry: Secondary | ICD-10-CM

## 2018-11-18 DIAGNOSIS — D509 Iron deficiency anemia, unspecified: Secondary | ICD-10-CM

## 2018-11-21 ENCOUNTER — Inpatient Hospital Stay: Payer: Medicare Other | Attending: Hematology and Oncology

## 2018-11-21 ENCOUNTER — Encounter: Payer: Self-pay | Admitting: Hematology and Oncology

## 2018-11-21 ENCOUNTER — Other Ambulatory Visit: Payer: Self-pay

## 2018-11-21 DIAGNOSIS — D519 Vitamin B12 deficiency anemia, unspecified: Secondary | ICD-10-CM | POA: Diagnosis not present

## 2018-11-21 DIAGNOSIS — E538 Deficiency of other specified B group vitamins: Secondary | ICD-10-CM

## 2018-11-21 DIAGNOSIS — D509 Iron deficiency anemia, unspecified: Secondary | ICD-10-CM | POA: Diagnosis not present

## 2018-11-21 LAB — CBC WITH DIFFERENTIAL/PLATELET
Abs Immature Granulocytes: 0.05 10*3/uL (ref 0.00–0.07)
Basophils Absolute: 0 10*3/uL (ref 0.0–0.1)
Basophils Relative: 0 %
Eosinophils Absolute: 0.2 10*3/uL (ref 0.0–0.5)
Eosinophils Relative: 2 %
HCT: 41.1 % (ref 36.0–46.0)
Hemoglobin: 13.4 g/dL (ref 12.0–15.0)
Immature Granulocytes: 1 %
Lymphocytes Relative: 28 %
Lymphs Abs: 3.1 10*3/uL (ref 0.7–4.0)
MCH: 30.5 pg (ref 26.0–34.0)
MCHC: 32.6 g/dL (ref 30.0–36.0)
MCV: 93.4 fL (ref 80.0–100.0)
Monocytes Absolute: 0.5 10*3/uL (ref 0.1–1.0)
Monocytes Relative: 5 %
Neutro Abs: 7 10*3/uL (ref 1.7–7.7)
Neutrophils Relative %: 64 %
Platelets: 262 10*3/uL (ref 150–400)
RBC: 4.4 MIL/uL (ref 3.87–5.11)
RDW: 12.7 % (ref 11.5–15.5)
WBC: 10.9 10*3/uL — ABNORMAL HIGH (ref 4.0–10.5)
nRBC: 0 % (ref 0.0–0.2)

## 2018-11-21 LAB — FERRITIN: Ferritin: 73 ng/mL (ref 11–307)

## 2018-11-21 LAB — VITAMIN B12: Vitamin B-12: 327 pg/mL (ref 180–914)

## 2018-11-21 LAB — IRON AND TIBC
Iron: 105 ug/dL (ref 28–170)
Saturation Ratios: 30 % (ref 10.4–31.8)
TIBC: 346 ug/dL (ref 250–450)
UIBC: 241 ug/dL

## 2018-11-22 ENCOUNTER — Other Ambulatory Visit: Payer: Self-pay | Admitting: Urgent Care

## 2018-11-24 ENCOUNTER — Other Ambulatory Visit: Payer: Self-pay | Admitting: Family Medicine

## 2018-11-28 ENCOUNTER — Inpatient Hospital Stay: Payer: Medicare Other

## 2018-11-28 DIAGNOSIS — D509 Iron deficiency anemia, unspecified: Secondary | ICD-10-CM | POA: Diagnosis not present

## 2018-11-28 DIAGNOSIS — D519 Vitamin B12 deficiency anemia, unspecified: Secondary | ICD-10-CM | POA: Diagnosis not present

## 2018-11-28 MED ORDER — CYANOCOBALAMIN 1000 MCG/ML IJ SOLN
1000.0000 ug | Freq: Once | INTRAMUSCULAR | Status: AC
  Administered 2018-11-28: 1000 ug via INTRAMUSCULAR

## 2018-12-01 ENCOUNTER — Emergency Department
Admission: EM | Admit: 2018-12-01 | Discharge: 2018-12-01 | Disposition: A | Discharge status: Home / Self Care | Payer: Medicare Other | Attending: Emergency Medicine | Admitting: Emergency Medicine

## 2018-12-01 ENCOUNTER — Encounter: Payer: Self-pay | Admitting: Emergency Medicine

## 2018-12-01 ENCOUNTER — Other Ambulatory Visit: Payer: Self-pay

## 2018-12-01 DIAGNOSIS — W230XXA Caught, crushed, jammed, or pinched between moving objects, initial encounter: Secondary | ICD-10-CM | POA: Diagnosis not present

## 2018-12-01 DIAGNOSIS — E039 Hypothyroidism, unspecified: Secondary | ICD-10-CM | POA: Diagnosis not present

## 2018-12-01 DIAGNOSIS — Y92512 Supermarket, store or market as the place of occurrence of the external cause: Secondary | ICD-10-CM | POA: Diagnosis not present

## 2018-12-01 DIAGNOSIS — I10 Essential (primary) hypertension: Secondary | ICD-10-CM | POA: Diagnosis not present

## 2018-12-01 DIAGNOSIS — Y999 Unspecified external cause status: Secondary | ICD-10-CM | POA: Diagnosis not present

## 2018-12-01 DIAGNOSIS — F17210 Nicotine dependence, cigarettes, uncomplicated: Secondary | ICD-10-CM | POA: Diagnosis not present

## 2018-12-01 DIAGNOSIS — S0990XA Unspecified injury of head, initial encounter: Secondary | ICD-10-CM | POA: Diagnosis not present

## 2018-12-01 DIAGNOSIS — S0181XA Laceration without foreign body of other part of head, initial encounter: Secondary | ICD-10-CM | POA: Diagnosis not present

## 2018-12-01 DIAGNOSIS — Y939 Activity, unspecified: Secondary | ICD-10-CM | POA: Diagnosis not present

## 2018-12-01 DIAGNOSIS — Z79899 Other long term (current) drug therapy: Secondary | ICD-10-CM | POA: Insufficient documentation

## 2018-12-01 DIAGNOSIS — R51 Headache: Secondary | ICD-10-CM | POA: Diagnosis present

## 2018-12-01 MED ORDER — METOCLOPRAMIDE HCL 5 MG PO TABS: 5.0000 mg | ORAL_TABLET | Freq: Three times a day (TID) | ORAL | 0 refills | Status: DC | PRN

## 2018-12-01 MED ORDER — ACETAMINOPHEN 325 MG PO TABS
650.0000 mg | ORAL_TABLET | Freq: Once | ORAL | Status: AC
  Administered 2018-12-01: 650 mg via ORAL
  Filled 2018-12-01: qty 2

## 2018-12-01 MED ORDER — CYCLOBENZAPRINE HCL 5 MG PO TABS: 5.0000 mg | ORAL_TABLET | Freq: Three times a day (TID) | ORAL | 0 refills | Status: DC | PRN

## 2018-12-01 MED ORDER — CYCLOBENZAPRINE HCL 10 MG PO TABS
10.0000 mg | ORAL_TABLET | Freq: Once | ORAL | Status: AC
  Administered 2018-12-01: 10 mg via ORAL
  Filled 2018-12-01: qty 1

## 2018-12-01 MED ORDER — LIDOCAINE HCL (PF) 1 % IJ SOLN
5.0000 mL | Freq: Once | INTRAMUSCULAR | Status: DC
  Filled 2018-12-01: qty 5

## 2018-12-01 MED ORDER — METOCLOPRAMIDE HCL 10 MG PO TABS
10.0000 mg | ORAL_TABLET | Freq: Once | ORAL | Status: AC
  Administered 2018-12-01: 10 mg via ORAL
  Filled 2018-12-01: qty 1

## 2018-12-01 NOTE — ED Notes (Signed)
See triage note  Presents with small laceration noted to left eye  States she was reaching for a can on top shelf at [facility]  A can fell hitting her on the left side of her head  No LOC

## 2018-12-01 NOTE — Discharge Instructions (Addendum)
Your exam is essentially normal following your exam. You do not have any serious head injury. Take the prescription meds as directed. Your laceration has been repaired with wound adhesive.

## 2018-12-01 NOTE — ED Triage Notes (Signed)
States approx 1 hour ago can at store dropped and hit her L brow. Denies use of blood thinners. Denies LOC.

## 2018-12-01 NOTE — ED Provider Notes (Signed)
[facility] Emergency Department Provider Note ____________________________________________  Time seen: 1647  I have reviewed the triage vital signs and the nursing notes.  HISTORY  Chief Complaint  Laceration  HPI Amanda Webb is a 47 y.o. female who presents herself to the ED for evaluation of a facial contusion and headache.  Patient was at a local grocery store, when she reached up on the top shelf to pull some canned product down, several of the cans came tumbling over the shelf, and hit her on the left brow and cheek.  The accident occurred at about 1:00 this afternoon.  She denies any nausea, vomiting, or dizziness patient denies any loss of consciousness, weakness, or usual disturbance.  She managed the wound at the store, and then later presented here for evaluation.  In the time since the incident the patient has had no syncope or weakness.  The laceration of the brow is now without any active bleeding.  Patient was concerned because she began to have some pain to the side of the face at the temple.  Past Medical History:  Diagnosis Date  . Allergy   . Anemia   . Anxiety   . Arrhythmia   . Arthritis   . Cervical dysplasia   . Cystitis   . Depression   . GERD (gastroesophageal reflux disease)   . Gross hematuria   . Heart murmur   . HLD (hyperlipidemia)   . Hypertension   . Hypothyroid   . Tobacco abuse     Patient Active Problem List   Diagnosis Date Noted  . Female cystocele 10/21/2018  . Urinary incontinence, mixed 10/21/2018  . Low vitamin B12 level 04/08/2018  . Iron deficiency anemia 03/14/2018  . GAD (generalized anxiety disorder) 03/10/2018  . Acute right-sided low back pain 10/28/2017  . Tobacco abuse 05/09/2017  . Multiple thyroid nodules 12/28/2016  . Simple chronic bronchitis ([place]) 12/13/2016  . BMI 40.0-44.9, adult ([place]) 09/26/2016  . HLD (hyperlipidemia) 09/26/2016  . Vitamin D deficiency 09/26/2016  . Pre-diabetes  06/17/2015  . Candidal intertrigo 02/21/2015  . Benign cyst of right kidney 01/17/2015  . Whiplash injury 09/03/2014  . Heart palpitations 04/02/2014  . Allergic rhinitis 12/31/2013  . Spondylosis of lumbar region without myelopathy or radiculopathy 12/28/2013  . Chronic low back pain 09/28/2013  . Other fatigue 09/28/2013  . Hypothyroidism 09/28/2013  . Alteration of body temperature 09/28/2013  . Plantar fasciitis, bilateral 07/10/2013  . Tarsal tunnel syndrome of right side 07/10/2013  . Plantar fascial fibromatosis 07/10/2013  . Encounter for surveillance of injectable contraceptive 05/13/2013  . Encounter for Depo-Provera contraception 05/13/2013  . Diuresis excessive 03/24/2013  . Abnormal uterine and vaginal bleeding, unspecified 03/24/2013  . Polyuria 03/24/2013  . Arthritis, multiple joint involvement 03/17/2013  . Fibroblastic disorder 07/28/2012  . Borderline personality disorder ([place]) 12/17/2010  . Essential hypertension 02/18/2010  . Carpal tunnel syndrome on both sides 12/17/2006  . Gall bladder stones 12/17/1998  . Major depressive disorder, single episode 02/21/1998  . Disk prolapse 04/17/1995  . Depression with anxiety 03/18/1995  . OCD (obsessive compulsive disorder) 03/18/1995  . Severe anxiety with panic 03/18/1995    Past Surgical History:  Procedure Laterality Date  . CARPAL TUNNEL RELEASE Bilateral 2011  . FOOT SURGERY Right 2013   Plantar fascia  . TONSILLECTOMY  1979    Prior to Admission medications   Medication Sig Start Date End Date Taking? Authorizing Provider  albuterol (VENTOLIN HFA) 108 (90 Base) MCG/ACT inhaler  INHALE 1 TO 2 PUFFS BY MOUTH AND INTO THE LUNGS EVERY 4 TO 6 HOURS IF NEEDED FOR SHORTNESS OF BREATH 11/24/18   Karamalegos, Devonne Doughty, DO  ARIPiprazole (ABILIFY) 2 MG tablet Take 1 tablet (2 mg total) by mouth daily. 07/18/18   Karamalegos, Devonne Doughty, DO  atenolol (TENORMIN) 50 MG tablet Take 1 tablet (50 mg total) by mouth daily.  04/16/18   Karamalegos, Devonne Doughty, DO  azithromycin (ZITHROMAX Z-PAK) 250 MG tablet Take 2 tabs (500mg  total) on Day 1. Take 1 tab (250mg ) daily for next 4 days. 10/20/18   Karamalegos, Devonne Doughty, DO  Cetirizine HCl (ZYRTEC ALLERGY) 10 MG CAPS     [provider]  COLACE 100 MG capsule TAKE 1 CAPSULE BY MOUTH ONCE DAILY 11/06/18   Karen Kitchens, NP  cyclobenzaprine (FLEXERIL) 5 MG tablet Take 1 tablet (5 mg total) by mouth 3 (three) times daily as needed for muscle spasms. 12/01/18   Evamae Rowen, Dannielle Karvonen, PA-C  ferrous sulfate 325 (65 FE) MG EC tablet 1 tablet (325mg ) BID to TID based on tolerance. TAKE WITH SOURCE OF VITAMIN C. 07/08/18   Karen Kitchens, NP  fluticasone [facility]) 50 MCG/ACT nasal spray USE 2 SPRAYS IN EACH NOSTRIL ONCE DAILY 09/05/18   Karamalegos, Devonne Doughty, DO  fluvoxaMINE (LUVOX) 100 MG tablet Take 150 mg by mouth 2 (two) times daily.    [provider]  ketoconazole (NIZORAL) 2 % shampoo APPLY TOPICALLY AS DIRECTED 11/12/18   Parks Ranger, Devonne Doughty, DO  lamoTRIgine (LAMICTAL) 200 MG tablet Take 200 mg by mouth daily.    [provider]  levothyroxine (SYNTHROID, LEVOTHROID) 50 MCG tablet TAKE 1 TABLET BY MOUTH EVERY MORNING 08/04/18   Karamalegos, Devonne Doughty, DO  medroxyPROGESTERone (DEPO-PROVERA) 150 MG/ML injection Inject 1 mL (150 mg total) into the muscle every 3 (three) months. 03/26/18   Defrancesco, Alanda Slim, MD  metoCLOPramide (REGLAN) 5 MG tablet Take 1 tablet (5 mg total) by mouth every 8 (eight) hours as needed for nausea or vomiting. 12/01/18   Tiffiny Worthy, Dannielle Karvonen, PA-C  nystatin ointment (MYCOSTATIN) Apply 1 application topically 2 (two) times daily. 10/23/18   Defrancesco, Alanda Slim, MD  nystatin-triamcinolone (MYCOLOG II) cream Apply 1 application topically 2 (two) times daily. For 10-14 days 10/22/18   Defrancesco, Alanda Slim, MD  pantoprazole (PROTONIX) 20 MG tablet TAKE 1 TABLET(20 MG) BY MOUTH DAILY 11/07/18   Parks Ranger,  Devonne Doughty, DO  predniSONE (DELTASONE) 50 MG tablet Take 1 tablet (50 mg total) by mouth daily with breakfast. 10/20/18   Karamalegos, Devonne Doughty, DO  QUEtiapine (SEROQUEL) 50 MG tablet Take 50 mg by mouth 3 (three) times daily.    [provider]  SUMAtriptan (IMITREX) 5 MG/ACT nasal spray Place 1 spray (5 mg total) every 2 (two) hours as needed into the nose for migraine. Max 3 doses in 24 hours 11/04/17   Mikey College, NP  triamcinolone ointment (KENALOG) 0.1 % Apply 1 application topically 2 (two) times daily. 10/23/18   Defrancesco, Alanda Slim, MD    Allergies Bee venom; Cat hair extract; Tetracycline; Tetracyclines & related; Lac bovis; Lactose; Milk protein; and Tape  Family History  Problem Relation Age of Onset  . Depression Mother   . Mental illness Mother   . Alcohol abuse Mother   . Cancer Maternal Grandmother   . Diabetes Maternal Grandmother   . Kidney disease Maternal Grandfather   . Diabetes Father   . Mental illness Father   .  Depression Father   . Drug abuse Father   . Depression Paternal Grandfather   . Drug abuse Paternal Grandfather   . Bladder Cancer Neg Hx     Social History Social History   Tobacco Use  . Smoking status: Current Every Day Smoker    Packs/day: 0.50    Years: 30.00    Pack years: 15.00    Types: Cigarettes  . Smokeless tobacco: Never Used  Substance Use Topics  . Alcohol use: No  . Drug use: No    Review of Systems  Constitutional: Negative for fever. Eyes: Negative for visual changes. ENT: Negative for sore throat. Cardiovascular: Negative for chest pain. Respiratory: Negative for shortness of breath. Gastrointestinal: Negative for abdominal pain, vomiting and diarrhea. Genitourinary: Negative for dysuria. Musculoskeletal: Negative for back pain. Skin: Negative for rash.  Left brow lack as above. Neurological: Negative for focal weakness or numbness.  Headache as  above. ____________________________________________  PHYSICAL EXAM:  VITAL SIGNS: ED Triage Vitals  Enc Vitals Group     BP 12/01/18 1425 118/76     Pulse Rate 12/01/18 1422 77     Resp 12/01/18 1422 20     Temp 12/01/18 1422 98.9 F (37.2 C)     Temp Source 12/01/18 1422 Oral     SpO2 12/01/18 1422 99 %     Weight 12/01/18 1424 254 lb (115.2 kg)     Height 12/01/18 1424 5' 6.5" (1.689 m)     Head Circumference --      Peak Flow --      Pain Score 12/01/18 1423 5     Pain Loc --      Pain Edu? --      Excl. in [place]? --     Constitutional: Alert and oriented. Well appearing and in no distress. Head: Normocephalic and atraumatic except for a 1 cm superficial laceration under the left brow.  No active bleeding is appreciated.  No significant edema or ecchymosis is noted. Eyes: Conjunctivae are normal without subconjunctival hemorrhage. PERRL. Normal extraocular movements fundi bilaterally. Ears: Canals clear. TMs intact bilaterally. Neck: Supple.  Normal range of motion without crepitus.  No distracting midline tenderness is noted. Cardiovascular: Normal rate, regular rhythm. Normal distal pulses. Respiratory: Normal respiratory effort. No wheezes/rales/rhonchi. Musculoskeletal: Nontender with normal range of motion in all extremities.  Neurologic: Cranial nerves II through XII grossly intact.  Normal finger-to-nose.  Normal tandem walk.  No pronator drift appreciated.  Normal gait without ataxia. Normal speech and language. No gross focal neurologic deficits are appreciated. Skin:  Skin is warm, dry and intact. No rash noted. Psychiatric: Mood and affect are normal. Patient exhibits appropriate insight and judgment. ____________________________________________  PROCEDURES  Procedures Reglan 10 mg PO Flexeril 10 mg PO ____________________________________________  INITIAL IMPRESSION / ASSESSMENT AND PLAN / ED COURSE  Patient with ED evaluation of a facial contusion and  laceration.  Patient exam is overall reassuring and benign at this time.  No acute intracranial process is suspected.  She has a reassuring neuro exam without any signs of cerebellar ataxia.  The superficial wound is treated with wound adhesive at this time.  Patient is discharged with prescriptions for Reglan and Flexeril take as needed.  She will follow with primary provider return to the ED as needed. ____________________________________________  FINAL CLINICAL IMPRESSION(S) / ED DIAGNOSES  Final diagnoses:  Injury of head, initial encounter  Facial laceration, initial encounter      Melvenia Needles, PA-C 12/01/18 1829  Nance Pear, MD 12/01/18 [phone]

## 2018-12-05 ENCOUNTER — Inpatient Hospital Stay: Payer: Medicare Other

## 2018-12-05 DIAGNOSIS — D519 Vitamin B12 deficiency anemia, unspecified: Secondary | ICD-10-CM | POA: Diagnosis not present

## 2018-12-05 DIAGNOSIS — D509 Iron deficiency anemia, unspecified: Secondary | ICD-10-CM

## 2018-12-05 MED ORDER — CYANOCOBALAMIN 1000 MCG/ML IJ SOLN
1000.0000 ug | Freq: Once | INTRAMUSCULAR | Status: AC
  Administered 2018-12-05: 1000 ug via INTRAMUSCULAR

## 2018-12-06 ENCOUNTER — Other Ambulatory Visit: Payer: Self-pay | Admitting: Urgent Care

## 2018-12-07 DIAGNOSIS — R69 Illness, unspecified: Secondary | ICD-10-CM | POA: Diagnosis not present

## 2018-12-07 DIAGNOSIS — J1189 Influenza due to unidentified influenza virus with other manifestations: Secondary | ICD-10-CM | POA: Diagnosis not present

## 2018-12-10 DIAGNOSIS — H6692 Otitis media, unspecified, left ear: Secondary | ICD-10-CM | POA: Diagnosis not present

## 2018-12-10 DIAGNOSIS — J019 Acute sinusitis, unspecified: Secondary | ICD-10-CM | POA: Diagnosis not present

## 2018-12-12 ENCOUNTER — Other Ambulatory Visit: Payer: Self-pay | Admitting: Obstetrics and Gynecology

## 2018-12-12 ENCOUNTER — Other Ambulatory Visit: Payer: Self-pay | Admitting: Family Medicine

## 2018-12-12 ENCOUNTER — Inpatient Hospital Stay: Payer: Medicare Other

## 2018-12-12 DIAGNOSIS — D509 Iron deficiency anemia, unspecified: Secondary | ICD-10-CM

## 2018-12-12 DIAGNOSIS — Z1231 Encounter for screening mammogram for malignant neoplasm of breast: Secondary | ICD-10-CM | POA: Diagnosis not present

## 2018-12-12 DIAGNOSIS — D519 Vitamin B12 deficiency anemia, unspecified: Secondary | ICD-10-CM | POA: Diagnosis not present

## 2018-12-12 MED ORDER — CYANOCOBALAMIN 1000 MCG/ML IJ SOLN
1000.0000 ug | Freq: Once | INTRAMUSCULAR | Status: AC
  Administered 2018-12-12: 1000 ug via INTRAMUSCULAR

## 2018-12-19 ENCOUNTER — Inpatient Hospital Stay: Payer: Medicare Other | Attending: Hematology and Oncology

## 2018-12-19 DIAGNOSIS — E538 Deficiency of other specified B group vitamins: Secondary | ICD-10-CM | POA: Diagnosis not present

## 2018-12-19 DIAGNOSIS — D509 Iron deficiency anemia, unspecified: Secondary | ICD-10-CM

## 2018-12-19 MED ORDER — CYANOCOBALAMIN 1000 MCG/ML IJ SOLN
1000.0000 ug | Freq: Once | INTRAMUSCULAR | Status: AC
  Administered 2018-12-19: 1000 ug via INTRAMUSCULAR

## 2018-12-23 DIAGNOSIS — H04123 Dry eye syndrome of bilateral lacrimal glands: Secondary | ICD-10-CM | POA: Diagnosis not present

## 2018-12-23 DIAGNOSIS — H16102 Unspecified superficial keratitis, left eye: Secondary | ICD-10-CM | POA: Diagnosis not present

## 2018-12-26 ENCOUNTER — Inpatient Hospital Stay: Payer: Medicare Other

## 2018-12-26 DIAGNOSIS — D509 Iron deficiency anemia, unspecified: Secondary | ICD-10-CM

## 2018-12-26 DIAGNOSIS — E538 Deficiency of other specified B group vitamins: Secondary | ICD-10-CM | POA: Diagnosis not present

## 2018-12-26 MED ORDER — CYANOCOBALAMIN 1000 MCG/ML IJ SOLN
1000.0000 ug | Freq: Once | INTRAMUSCULAR | Status: AC
  Administered 2018-12-26: 1000 ug via INTRAMUSCULAR

## 2018-12-30 ENCOUNTER — Other Ambulatory Visit: Payer: Self-pay | Admitting: Family Medicine

## 2018-12-31 ENCOUNTER — Ambulatory Visit: Payer: Medicare Other | Admitting: Urology

## 2018-12-31 ENCOUNTER — Encounter: Payer: Self-pay | Admitting: Urology

## 2019-01-01 ENCOUNTER — Ambulatory Visit (INDEPENDENT_AMBULATORY_CARE_PROVIDER_SITE_OTHER): Payer: Medicare Other | Admitting: Family Medicine

## 2019-01-01 ENCOUNTER — Encounter: Payer: Self-pay | Admitting: Family Medicine

## 2019-01-01 VITALS — BP 126/67 | HR 72 | Temp 99.2°F | Resp 16 | Ht 66.0 in | Wt 265.0 lb

## 2019-01-01 DIAGNOSIS — R358 Other polyuria: Secondary | ICD-10-CM

## 2019-01-01 DIAGNOSIS — R7303 Prediabetes: Secondary | ICD-10-CM

## 2019-01-01 DIAGNOSIS — R609 Edema, unspecified: Secondary | ICD-10-CM

## 2019-01-01 DIAGNOSIS — R3589 Other polyuria: Secondary | ICD-10-CM

## 2019-01-01 DIAGNOSIS — Z6841 Body Mass Index (BMI) 40.0 and over, adult: Secondary | ICD-10-CM | POA: Diagnosis not present

## 2019-01-01 DIAGNOSIS — I10 Essential (primary) hypertension: Secondary | ICD-10-CM

## 2019-01-01 DIAGNOSIS — E042 Nontoxic multinodular goiter: Secondary | ICD-10-CM | POA: Diagnosis not present

## 2019-01-01 MED ORDER — HYDROCHLOROTHIAZIDE 12.5 MG PO TABS: 12.5000 mg | ORAL_TABLET | Freq: Every day | ORAL | 1 refills | Status: DC

## 2019-01-01 NOTE — Progress Notes (Signed)
Subjective:    Patient ID: Amanda Webb, female    DOB: 05-24-1971, 48 y.o.   MRN: [id number]  Amanda Webb is a 48 y.o. female presenting on 01/01/2019 for Edema (HTN, feets and hands onset last night for edema but onset week felt HTN)  Patient presents for a same day appointment.  HPI   Bilateral Lower Extremity Edema / HTN Prior history of lower extremity edema, previously seen for this in 02/2018, has had prior lab testing and work up negative results. No prior ECHO or other advanced testing. - She used to be on thiazide diuretic but caused BP too low, has been off - Admits not eating low sodium diet, does add salt, drinks sweet tea often - Also has urinary frequency and history of incontinence. Not slept in a week well, wakes up often urinate - she has not re-scheduled with Urologist, but plans to contact them now.  Pre-Diabetes Last lab for A1c was 6.1 in 02/2018, has not had it repeated since. Not adhering to particular low carb diet. Drinks sweet tea regularly  Recently seen 12/10/18 for sinus infection and Left Ear infection - treated, now asking about some dizziness from inner ear also She was treated preventatively Tamiflu   Depression screen [facility] 2/9 01/01/2019 10/20/2018 09/26/2016  Decreased Interest 0 0 0  Down, Depressed, Hopeless 0 0 0  PHQ - 2 Score 0 0 0  Altered sleeping 3 - -  Tired, decreased energy 0 - -  Change in appetite 0 - -  Feeling bad or failure about yourself  0 - -  Trouble concentrating 0 - -  Moving slowly or fidgety/restless 0 - -  Suicidal thoughts 0 - -  PHQ-9 Score 3 - -  Difficult doing work/chores Not difficult at all - -    Social History   Tobacco Use  . Smoking status: Current Every Day Smoker    Packs/day: 0.50    Years: 30.00    Pack years: 15.00    Types: Cigarettes  . Smokeless tobacco: Current User  Substance Use Topics  . Alcohol use: No  . Drug use: No    Review of Systems Per HPI unless specifically  indicated above     Objective:    BP 126/67   Pulse 72   Temp 99.2 F (37.3 C) (Oral)   Resp 16   Ht 5\' 6"  (1.676 m)   Wt 265 lb (120.2 kg)   BMI 42.77 kg/m   Wt Readings from Last 3 Encounters:  01/01/19 265 lb (120.2 kg)  12/01/18 254 lb (115.2 kg)  11/12/18 256 lb 9.6 oz (116.4 kg)    Physical Exam Vitals signs and nursing note reviewed.  Constitutional:      General: She is not in acute distress.    Appearance: She is well-developed. She is not diaphoretic.     Comments: Well-appearing, comfortable, cooperative, obese  HENT:     Head: Normocephalic and atraumatic.  Eyes:     General:        Right eye: No discharge.        Left eye: No discharge.     Conjunctiva/sclera: Conjunctivae normal.  Neck:     Musculoskeletal: Normal range of motion and neck supple.     Thyroid: No thyromegaly.  Cardiovascular:     Rate and Rhythm: Normal rate and regular rhythm.     Heart sounds: Normal heart sounds. No murmur.  Pulmonary:     Effort: Pulmonary effort  is normal. No respiratory distress.     Breath sounds: Normal breath sounds. No wheezing or rales.  Musculoskeletal: Normal range of motion.     Right lower leg: Edema (trace only lower extremity) present.     Left lower leg: Edema (trace only lower extremity) present.  Lymphadenopathy:     Cervical: No cervical adenopathy.  Skin:    General: Skin is warm and dry.     Findings: No erythema or rash.  Neurological:     Mental Status: She is alert and oriented to person, place, and time.  Psychiatric:        Behavior: Behavior normal.     Comments: Well groomed, good eye contact, normal speech and thoughts    Results for orders placed or performed in visit on 01/01/19  COMPLETE METABOLIC PANEL WITH GFR  Result Value Ref Range   Glucose, Bld 133 (H) 65 - 99 mg/dL   BUN 9 7 - 25 mg/dL   Creat 0.65 0.50 - 1.10 mg/dL   GFR, Est Non African American 106 > OR = 60 mL/min/1.62m2   GFR, Est African American 123 > OR = 60  mL/min/1.67m2   BUN/Creatinine Ratio NOT APPLICABLE 6 - 22 (calc)   Sodium 141 135 - 146 mmol/L   Potassium 4.2 3.5 - 5.3 mmol/L   Chloride 106 98 - 110 mmol/L   CO2 27 20 - 32 mmol/L   Calcium 9.1 8.6 - 10.2 mg/dL   Total Protein 6.6 6.1 - 8.1 g/dL   Albumin 4.1 3.6 - 5.1 g/dL   Globulin 2.5 1.9 - 3.7 g/dL (calc)   AG Ratio 1.6 1.0 - 2.5 (calc)   Total Bilirubin 0.4 0.2 - 1.2 mg/dL   Alkaline phosphatase (APISO) 78 33 - 115 U/L   AST 15 10 - 35 U/L   ALT 17 6 - 29 U/L      Assessment & Plan:   Problem List Items Addressed This Visit    BMI 40.0-44.9, adult (HCC) Encouraged diet / exercise improvement for wt loss Reduce Sweet tea    Essential hypertension - Primary   Relevant Medications   hydrochlorothiazide (HYDRODIURIL) 12.5 MG tablet   atenolol (TENORMIN) 50 MG tablet   Other Relevant Orders   COMPLETE METABOLIC PANEL WITH GFR (Completed)   Multiple thyroid nodules   Relevant Medications   atenolol (TENORMIN) 50 MG tablet   Other Relevant Orders   TSH   T4, free   Polyuria Return to Urology, she never scheduled prior referral - she will call them now Follow-up as advised    Pre-diabetes Overdue for A1c trend, last in 02/2018 Check labs today, follow-up Encourage improve lifestyle diet exercise wt loss    Relevant Orders   Hemoglobin A1c    Other Visit Diagnoses    Essential (primary) hypertension     Controlled HTN Based on edema, will adjust meds REDUCE BB atenolol from 50 to half tab = 25mg  daily RESTART HCTZ 12.5mg  daily low dose for edema / HTN Check chemistry Follow-up edema / HTN     Relevant Medications   hydrochlorothiazide (HYDRODIURIL) 12.5 MG tablet   atenolol (TENORMIN) 50 MG tablet   Other Relevant Orders   COMPLETE METABOLIC PANEL WITH GFR (Completed)   Peripheral edema     Recent flare, episodic, seems improved w/ rest elevation Likely triggered by sodium / dietary habits, also with risk factor obesity May have elevated glucose as  well  Plan Start thiazide low dose now, future reconsider lasix  PRN only RICE therapy reviewed Future if refractory or symptomatic consider other workup, doppler venous w/ reflux or refer to Vascular     Relevant Medications   hydrochlorothiazide (HYDRODIURIL) 12.5 MG tablet      Meds ordered this encounter  Medications  . hydrochlorothiazide (HYDRODIURIL) 12.5 MG tablet    Sig: Take 1 tablet (12.5 mg total) by mouth daily.    Dispense:  90 tablet    Refill:  1      Follow up plan: Return in about 3 months (around 04/02/2019) for HTN, Edema, Sugar.   [name], DO [facility] 01/01/2019, 10:22 AM

## 2019-01-01 NOTE — Patient Instructions (Addendum)
Thank you for coming to the office today.  For swelling - REDUCE Atenolol to HALF pill daily, and START HCTZ 12.5mg  daily for fluid pill - keep track of BP  Limit sweet tea, caution with salt added and check food labels for sodium - goal for low salt diet   Elevation - if significant swelling, lift leg above heart level (toes above your nose) to help reduce swelling, most helpful at night after day of being on your feet  We will check labs, kidney, sugar, A1c, thyroid chemistry - stay tuned  May also be some inner ear residual issue for balance and unsteady  Check with Psych for poor sleep / anxiety   Please schedule a Follow-up Appointment to: Return in about 3 months (around 04/02/2019) for HTN, Edema, Sugar.  If you have any other questions or concerns, please feel free to call the office or send a message through [place]. You may also schedule an earlier appointment if necessary.  Additionally, you may be receiving a survey about your experience at our office within a few days to 1 week by e-mail or mail. We value your feedback.  Amanda Putnam, DO [place]

## 2019-01-02 ENCOUNTER — Inpatient Hospital Stay: Payer: Medicare Other

## 2019-01-02 LAB — COMPLETE METABOLIC PANEL WITH GFR
AG Ratio: 1.6 (calc) (ref 1.0–2.5)
ALT: 17 U/L (ref 6–29)
AST: 15 U/L (ref 10–35)
Albumin: 4.1 g/dL (ref 3.6–5.1)
Alkaline phosphatase (APISO): 78 U/L (ref 33–115)
BUN: 9 mg/dL (ref 7–25)
CO2: 27 mmol/L (ref 20–32)
Calcium: 9.1 mg/dL (ref 8.6–10.2)
Chloride: 106 mmol/L (ref 98–110)
Creat: 0.65 mg/dL (ref 0.50–1.10)
GFR, Est African American: 123 mL/min/{1.73_m2} (ref >=60)
GFR, Est Non African American: 106 mL/min/{1.73_m2} (ref >=60)
Globulin: 2.5 g/dL (calc) (ref 1.9–3.7)
Glucose, Bld: 133 mg/dL — ABNORMAL HIGH (ref 65–99)
Potassium: 4.2 mmol/L (ref 3.5–5.3)
Sodium: 141 mmol/L (ref 135–146)
Total Bilirubin: 0.4 mg/dL (ref 0.2–1.2)
Total Protein: 6.6 g/dL (ref 6.1–8.1)

## 2019-01-02 LAB — HEMOGLOBIN A1C
Hgb A1c MFr Bld: 7 % of total Hgb — ABNORMAL HIGH (ref <5.7)
Mean Plasma Glucose: 154 (calc)
eAG (mmol/L): 8.5 (calc)

## 2019-01-02 LAB — TSH: TSH: 2.84 mIU/L

## 2019-01-02 LAB — T4, FREE: Free T4: 1.2 ng/dL (ref 0.8–1.8)

## 2019-01-05 ENCOUNTER — Inpatient Hospital Stay: Payer: Medicare Other

## 2019-01-05 ENCOUNTER — Telehealth: Payer: Self-pay | Admitting: Family Medicine

## 2019-01-05 NOTE — Telephone Encounter (Signed)
Called to RE- schedule Medicare Annual Wellness Visit with the Nurse Health Advisor that is on 02/03/2019 at 2:15 PM.  Patient states will have to call back so she can coordinate her appointments.    If patient returns call, please note: their last AWV was on 05/21/16, please schedule AWV with NHA any date after 02/03/2019.   Thank you! For any questions please contact: Janace Hoard at [phone] or Skype [email or web address]

## 2019-01-09 ENCOUNTER — Inpatient Hospital Stay: Payer: Medicare Other

## 2019-01-09 DIAGNOSIS — E538 Deficiency of other specified B group vitamins: Secondary | ICD-10-CM | POA: Diagnosis not present

## 2019-01-09 DIAGNOSIS — D509 Iron deficiency anemia, unspecified: Secondary | ICD-10-CM

## 2019-01-09 MED ORDER — CYANOCOBALAMIN 1000 MCG/ML IJ SOLN
1000.0000 ug | Freq: Once | INTRAMUSCULAR | Status: AC
  Administered 2019-01-09: 1000 ug via INTRAMUSCULAR

## 2019-01-11 DIAGNOSIS — R7303 Prediabetes: Secondary | ICD-10-CM

## 2019-01-12 ENCOUNTER — Other Ambulatory Visit: Payer: Self-pay

## 2019-01-12 MED ORDER — GLUCOSE BLOOD VI STRP: ORAL_STRIP | 12 refills | Status: DC

## 2019-01-12 MED ORDER — ACCU-CHEK GUIDE VI STRP: ORAL_STRIP | 12 refills | Status: DC

## 2019-01-12 MED ORDER — BLOOD GLUCOSE MONITORING SUPPL KIT: PACK | 0 refills | Status: DC

## 2019-01-12 MED ORDER — LANCET DEVICE MISC: 12 refills | Status: DC

## 2019-01-12 MED ORDER — ACCU-CHEK FASTCLIX LANCETS MISC: 12 refills | Status: DC

## 2019-01-12 MED ORDER — ACCU-CHEK GUIDE W/DEVICE KIT: 1.0000 | PACK | Freq: Every day | 0 refills | Status: DC

## 2019-01-13 ENCOUNTER — Other Ambulatory Visit: Payer: Self-pay | Admitting: Family Medicine

## 2019-01-13 NOTE — Telephone Encounter (Signed)
I do not see this form from [facility] that the patient is referencing for her diabetic testing supplies.  Could you check to see if we have a copy of this or if they can fax another? They should have all of the correct orders now for her testing supplies, I resent them yesterday 01/12/19.  [name], [facility] 01/13/2019, 6:41 PM

## 2019-01-14 ENCOUNTER — Telehealth: Payer: Self-pay | Admitting: Family Medicine

## 2019-01-14 NOTE — Telephone Encounter (Signed)
Completed form. To be faxed back 01/15/19  [name], [facility] 01/14/2019, 5:42 PM

## 2019-01-14 NOTE — Telephone Encounter (Signed)
[facility] [address] should have sent a fax for authorization for test strips and lancets.

## 2019-01-14 NOTE — Telephone Encounter (Signed)
Spoke to [employer] had wrong fax number for office given right number once we receive form needs to be filled out and fax it back to corporate office.

## 2019-01-19 ENCOUNTER — Other Ambulatory Visit: Payer: Self-pay | Admitting: Family Medicine

## 2019-01-19 DIAGNOSIS — J309 Allergic rhinitis, unspecified: Secondary | ICD-10-CM

## 2019-01-23 DIAGNOSIS — H5213 Myopia, bilateral: Secondary | ICD-10-CM | POA: Diagnosis not present

## 2019-01-23 DIAGNOSIS — H16102 Unspecified superficial keratitis, left eye: Secondary | ICD-10-CM | POA: Diagnosis not present

## 2019-01-23 DIAGNOSIS — F4310 Post-traumatic stress disorder, unspecified: Secondary | ICD-10-CM | POA: Diagnosis not present

## 2019-01-23 DIAGNOSIS — H04123 Dry eye syndrome of bilateral lacrimal glands: Secondary | ICD-10-CM | POA: Diagnosis not present

## 2019-01-26 ENCOUNTER — Ambulatory Visit: Payer: Medicare Other | Admitting: Hematology and Oncology

## 2019-01-26 ENCOUNTER — Other Ambulatory Visit: Payer: Medicare Other

## 2019-01-28 ENCOUNTER — Ambulatory Visit (INDEPENDENT_AMBULATORY_CARE_PROVIDER_SITE_OTHER): Payer: Medicare Other | Admitting: Obstetrics and Gynecology

## 2019-01-28 ENCOUNTER — Other Ambulatory Visit: Payer: Self-pay

## 2019-01-28 ENCOUNTER — Other Ambulatory Visit: Payer: Self-pay | Admitting: Family Medicine

## 2019-01-28 VITALS — BP 121/78 | HR 82 | Ht 67.0 in | Wt 260.4 lb

## 2019-01-28 DIAGNOSIS — E039 Hypothyroidism, unspecified: Secondary | ICD-10-CM

## 2019-01-28 DIAGNOSIS — Z3042 Encounter for surveillance of injectable contraceptive: Secondary | ICD-10-CM | POA: Diagnosis not present

## 2019-01-28 MED ORDER — MEDROXYPROGESTERONE ACETATE 150 MG/ML IM SUSP
150.0000 mg | Freq: Once | INTRAMUSCULAR | Status: AC
  Administered 2019-01-28: 150 mg via INTRAMUSCULAR

## 2019-01-28 MED ORDER — MEDROXYPROGESTERONE ACETATE 150 MG/ML IM SUSP: 150.0000 mg | INTRAMUSCULAR | 1 refills | Status: DC

## 2019-01-28 NOTE — Progress Notes (Signed)
Date last pap: 04/02/17 (Neg/Neg). Last Depo-Provera: 11/12/18. Side Effects if any: Denies side effects. Depo-Provera 150 mg IM given by: Jennye Moccasin, CMA. Next appointment due between 04/16/19-04/30/19.  BP 121/78   Pulse 82   Ht 5\' 7"  (1.702 m)   Wt 260 lb 6.4 oz (118.1 kg)   LMP 01/21/2019 (Exact Date)   BMI 40.78 kg/m    Patient request refill be sent for Depo to pharmacy on file, prescription sent and advised patient she is due for annual exam 06/2019.  Patient verbalized understanding.    Previous Dr.Defrancesco patient.  Planning to continue care with Dr.Evans.

## 2019-01-29 ENCOUNTER — Inpatient Hospital Stay: Payer: Medicare Other

## 2019-01-30 ENCOUNTER — Inpatient Hospital Stay: Payer: Medicare Other

## 2019-01-30 ENCOUNTER — Inpatient Hospital Stay: Payer: Medicare Other | Admitting: Hematology and Oncology

## 2019-01-30 NOTE — Progress Notes (Deleted)
[facility] day:  01/30/2019   Chief Complaint: Amanda Webb is a 48 y.o. female with iron deficiency and B12 deficiency who is seen for a 6 month assessment on oral iron.   HPI:  The patient was last seen in the hematology clinic on 07/24/2018.  At that time, she felt "ok".  Exam was stable.  Hemoglobin was 13.3.  Ferritin was 58.  We discussed continuation of oral iron and B12.  We discussed sleep apnea testing.  Labs on 11/21/2018 revealed a hematocrit of 41.1., hemoglobin 13.4, MCV 93.4, platelets 262,000, WBC 10,900 with an ANC of 7000.  Ferritin was 73.  Iron saturation was 30 with a TIBC of 346.  B12 was 327.  During the interim,    Past Medical History:  Diagnosis Date  . Allergy   . Anemia   . Anxiety   . Arrhythmia   . Arthritis   . Cervical dysplasia   . Cystitis   . Depression   . GERD (gastroesophageal reflux disease)   . Gross hematuria   . Heart murmur   . HLD (hyperlipidemia)   . Hypertension   . Hypothyroid   . Tobacco abuse     Past Surgical History:  Procedure Laterality Date  . CARPAL TUNNEL RELEASE Bilateral 2011  . FOOT SURGERY Right 2013   Plantar fascia  . TONSILLECTOMY  1979    Family History  Problem Relation Age of Onset  . Depression Mother   . Mental illness Mother   . Alcohol abuse Mother   . Cancer Maternal Grandmother   . Diabetes Maternal Grandmother   . Ovarian cancer Maternal Grandmother   . Kidney disease Maternal Grandfather   . Diabetes Father   . Mental illness Father   . Depression Father   . Drug abuse Father   . Depression Paternal Grandfather   . Drug abuse Paternal Grandfather   . Bladder Cancer Neg Hx   . Breast cancer Neg Hx     Social History:  reports that she has been smoking cigarettes. She has a 15.00 pack-year smoking history. She has never used smokeless tobacco. She reports that she does not drink alcohol or use drugs.  She smokes 1/2 pack/day x 30  years.  Patient denies known exposures to radiation on toxins.  She is caring for her 42 year old grandson.  The patient is alone today.  Allergies:  Allergies  Allergen Reactions  . Bee Venom Anaphylaxis, Hives and Swelling    Carries Epi pen. Carries Epi pen.  . Cat Hair Extract Itching, Other (See Comments) and Swelling    Allergic to trees, nuts, wheat, grass, cats & dogs - itchy watery eyes, swelling. Uses Zyrtec & Flonase & Benadryl if really bad. Used to get allergy shots. Allergic to trees, nuts, wheat, grass, cats & dogs - itchy watery eyes, swelling. Uses Zyrtec & Flonase & Benadryl if really bad. Used to get allergy shots.  . Tetracycline     Other reaction(s): Unknown  . Tetracyclines & Related Nausea And Vomiting  . Lac Bovis Diarrhea and Nausea And Vomiting  . Lactose Diarrhea and Nausea And Vomiting  . Milk Protein Diarrhea and Nausea And Vomiting  . Tape Other (See Comments) and Rash    Needs to use paper tape. Breaks out with severe rash, pulls skin off when using adhesive.    Current Medications: Current Outpatient Medications  Medication Sig Dispense Refill  . ACCU-CHEK FASTCLIX LANCETS  MISC Check sugar 1-2 x daily as instructed 102 each 12  . ACCU-CHEK GUIDE test strip Check blood sugar up to 1-2x daily as instructed 100 each 12  . albuterol (PROVENTIL HFA;VENTOLIN HFA) 108 (90 Base) MCG/ACT inhaler INHALE 1 TO 2 PUFFS BY MOUTH AND INTO LUNGS EVERY 4 TO 6 HOURS AS NEEDED FOR SHORTNESS OF BREATH 18 g 2  . ARIPiprazole (ABILIFY) 2 MG tablet Take 1 tablet (2 mg total) by mouth daily.    Marland Kitchen atenolol (TENORMIN) 50 MG tablet Take 0.5 tablets (25 mg total) by mouth daily. 30 tablet 11  . Blood Glucose Monitoring Suppl (ACCU-CHEK GUIDE) w/Device KIT 1 kit by Does not apply route daily. Use to check blood sugar 1-2x daily as instructed 1 kit 0  . Cetirizine HCl (ZYRTEC ALLERGY) 10 MG CAPS     . DOK 100 MG capsule TAKE 1 CAPSULE BY MOUTH ONCE DAILY 30 capsule 0  . ferrous  sulfate 325 (65 FE) MG EC tablet 1 tablet (369m) BID to TID based on tolerance. TAKE WITH SOURCE OF VITAMIN C. 90 tablet 2  . fluticasone (FLONASE) 50 MCG/ACT nasal spray USE 2 SPRAYS IN EACH NOSTRIL ONCE DAILY 48 g 1  . fluvoxaMINE (LUVOX) 100 MG tablet Take 150 mg by mouth 2 (two) times daily.    . hydrochlorothiazide (HYDRODIURIL) 12.5 MG tablet Take 1 tablet (12.5 mg total) by mouth daily. 90 tablet 1  . ketoconazole (NIZORAL) 2 % shampoo APPLY TOPICALLY AS DIRECTED 120 mL 0  . lamoTRIgine (LAMICTAL) 200 MG tablet Take 200 mg by mouth daily.    .Elmore GuiseDevice MISC Use to monitor Blood Sugar up tp three times daily for ICD10 E11.9 (Brand compatible to insurance plan and monitoring devise) 100 each 12  . levothyroxine (SYNTHROID, LEVOTHROID) 50 MCG tablet TAKE 1 TABLET BY MOUTH EVERY MORNING 90 tablet 3  . medroxyPROGESTERone (DEPO-PROVERA) 150 MG/ML injection Inject 1 mL (150 mg total) into the muscle every 3 (three) months. 1 mL 1  . nystatin ointment (MYCOSTATIN) Apply 1 application topically 2 (two) times daily. 30 g 0  . nystatin-triamcinolone (MYCOLOG II) cream Apply 1 application topically 2 (two) times daily. For 10-14 days 60 g 0  . pantoprazole (PROTONIX) 20 MG tablet TAKE 1 TABLET(20 MG) BY MOUTH DAILY 30 tablet 5  . QUEtiapine (SEROQUEL) 50 MG tablet Take 50 mg by mouth 3 (three) times daily.    . SUMAtriptan (IMITREX) 5 MG/ACT nasal spray Place 1 spray (5 mg total) every 2 (two) hours as needed into the nose for migraine. Max 3 doses in 24 hours 1 Inhaler 0  . triamcinolone ointment (KENALOG) 0.1 % Apply topically 2 (two) times daily. 30 g 0   No current facility-administered medications for this visit.     Review of Systems  Constitutional: Positive for malaise/fatigue and weight loss (1 pound). Negative for chills, diaphoresis and fever.       Feels "ok".  HENT: Positive for sore throat. Negative for congestion, ear discharge, ear pain, nosebleeds, sinus pain and tinnitus.    Eyes: Negative.  Negative for blurred vision, double vision, photophobia, pain, discharge and redness.  Respiratory: Negative for cough, hemoptysis and sputum production. Shortness of breath: some exertional.        No interval sleep apnea testing.  Cardiovascular: Negative.  Negative for chest pain, palpitations, orthopnea, leg swelling and PND.  Gastrointestinal: Negative for abdominal pain, blood in stool, constipation, diarrhea, melena, nausea and vomiting.  Genitourinary: Negative for dysuria, frequency,  hematuria and urgency.       Urinary incontinence. No vaginal bleeding.  Musculoskeletal: Positive for joint pain (arthritis in hands). Negative for back pain, falls and myalgias.       On and off pain in hands due to weather.  Skin: Negative for itching and rash.  Neurological: Positive for headaches (chronic- off/on). Negative for dizziness, tremors, sensory change, focal weakness and weakness.  Endo/Heme/Allergies: Does not bruise/bleed easily.       HYPOthyroidism on levothyroxine  Psychiatric/Behavioral: Positive for depression. Negative for memory loss (poor memory) and suicidal ideas. The patient is nervous/anxious (irritability). The patient does not have insomnia.   All other systems reviewed and are negative.  Performance status (ECOG): 1-2   Physical Exam: Last menstrual period 01/21/2019. GENERAL:  Well developed, well nourished, woman sitting comfortably in the exam room in no acute distress. MENTAL STATUS:  Alert and oriented to person, place and time. HEAD:  Blonde hair pulled back.  Normocephalic, atraumatic, face symmetric, no Cushingoid features. EYES:  Blue eyes.  Pupils equal round and reactive to light and accomodation.  No conjunctivitis or scleral icterus. ENT:  Oropharynx clear without lesion.  Tongue normal. Mucous membranes moist.  RESPIRATORY:  Clear to auscultation without rales, wheezes or rhonchi. CARDIOVASCULAR:  Regular rate and rhythm without murmur,  rub or gallop. ABDOMEN:  Soft, non-tender, with active bowel sounds, and no hepatosplenomegaly.  No masses. SKIN:  No rashes, ulcers or lesions. EXTREMITIES: No edema, no skin discoloration or tenderness.  No palpable cords. LYMPH NODES: No palpable cervical, supraclavicular, axillary or inguinal adenopathy  NEUROLOGICAL: Unremarkable. PSYCH:  Appropriate.  Flat affect.   No visits with results within 3 Day(s) from this visit.  Latest known visit with results is:  Office Visit on 01/01/2019  Component Date Value Ref Range Status  . Glucose, Bld 01/01/2019 133* 65 - 99 mg/dL Final   Comment: .            Fasting reference interval . For someone without known diabetes, a glucose value >125 mg/dL indicates that they may have diabetes and this should be confirmed with a follow-up test. .   . BUN 01/01/2019 9  7 - 25 mg/dL Final  . Creat 01/01/2019 0.65  0.50 - 1.10 mg/dL Final  . GFR, Est Non African American 01/01/2019 106  > OR = 60 mL/min/1.52m Final  . GFR, Est African American 01/01/2019 123  > OR = 60 mL/min/1.719mFinal  . BUN/Creatinine Ratio 0171/24/5809OT APPLICABLE  6 - 22 (calc) Final  . Sodium 01/01/2019 141  135 - 146 mmol/L Final  . Potassium 01/01/2019 4.2  3.5 - 5.3 mmol/L Final  . Chloride 01/01/2019 106  98 - 110 mmol/L Final  . CO2 01/01/2019 27  20 - 32 mmol/L Final  . Calcium 01/01/2019 9.1  8.6 - 10.2 mg/dL Final  . Total Protein 01/01/2019 6.6  6.1 - 8.1 g/dL Final  . Albumin 01/01/2019 4.1  3.6 - 5.1 g/dL Final  . Globulin 01/01/2019 2.5  1.9 - 3.7 g/dL (calc) Final  . AG Ratio 01/01/2019 1.6  1.0 - 2.5 (calc) Final  . Total Bilirubin 01/01/2019 0.4  0.2 - 1.2 mg/dL Final  . Alkaline phosphatase (APISO) 01/01/2019 78  33 - 115 U/L Final  . AST 01/01/2019 15  10 - 35 U/L Final  . ALT 01/01/2019 17  6 - 29 U/L Final  . Hgb A1c MFr Bld 01/01/2019 7.0* <5.7 % of total Hgb Final   Comment:  For someone without known diabetes, a hemoglobin A1c value of 6.5%  or greater indicates that they may have  diabetes and this should be confirmed with a follow-up  test. . For someone with known diabetes, a value <7% indicates  that their diabetes is well controlled and a value  greater than or equal to 7% indicates suboptimal  control. A1c targets should be individualized based on  duration of diabetes, age, comorbid conditions, and  other considerations. . Currently, no consensus exists regarding use of hemoglobin A1c for diagnosis of diabetes for children. .   . Mean Plasma Glucose 01/01/2019 154  (calc) Final  . eAG (mmol/L) 01/01/2019 8.5  (calc) Final  . TSH 01/01/2019 2.84  mIU/L Final   Comment:           Reference Range .           > or = 20 Years  0.40-4.50 .                Pregnancy Ranges           First trimester    0.26-2.66           Second trimester   0.55-2.73           Third trimester    0.43-2.91   . Free T4 01/01/2019 1.2  0.8 - 1.8 ng/dL Final    Assessment:  Amanda Webb is a 48 y.o. female with a mild normocytic anemia.  She has iron deficiency.  Hemoglobin was normal until 03/12/2018.  She notes scant vaginal bleeding on DepoProvera.  She denies any hematuria, melena or hematochezia.  Diet appears good.  She has ice pica.  CBC on 03/12/2018 revealed a hematocrit of 32.7, hemoglobin 11.2, and MCV 82.4.  Normal studies included:  Creatinine, LFTs, TSH and free T4.  Ferritin was 11.  Iron saturation was 8% and TIBC 362.  Work-up on 03/24/2018 revealed a hematocrit of 35.7, hemoglobin 11.9, and MCV 84.2.  Folate was 17.2.  B12 was 296 (low normal).  MMA was normal.  Intrinsic factor antibody and antiparietal cell antibody was normal on 06/12/2018.  Retic was 1.3%.  Guaiac cards were negative x 3.  She began oral B12 on 04/13/2018.  B12 was 502 on 06/12/2018.  Ferritin has been followed: 8 on 04/28/2012, 20 on 09/03/2012, 18 on 09/28/2013, 11 on 03/12/2018, 38 on 06/11/2018, 58 on 07/24/2018, and 73 on 11/21/2018.  She  is on oral iron BID.  Symptomatically,   Plan: 1. Labs today:  CBC with diff, ferritin.   2. Iron deficiency anemia:  Continue oral ron BID with OJ.  Ferritin goal is 100. 3. B12 deficiency:  Continue oral B12. 4. Sleep apnea:  Refer to [facility] for PSG testing.   5. RTC in 6 months for MD assessment and labs (CBC with diff, ferritin - day before)   Honor Loh, NP 01/30/2019, 5:31 AM   I saw and evaluated the patient, participating in the key portions of the service and reviewing pertinent diagnostic studies and records.  I reviewed the nurse practitioner's note and agree with the findings and the plan.  The assessment and plan were discussed with the patient.  A few questions were asked by the patient and answered.   Nolon Stalls, MD 01/30/2019, 5:31 AM

## 2019-02-03 ENCOUNTER — Ambulatory Visit: Payer: Medicare Other

## 2019-02-10 ENCOUNTER — Ambulatory Visit: Payer: Medicare Other

## 2019-02-12 ENCOUNTER — Inpatient Hospital Stay: Payer: Medicare Other | Attending: Hematology and Oncology

## 2019-02-12 DIAGNOSIS — D509 Iron deficiency anemia, unspecified: Secondary | ICD-10-CM | POA: Diagnosis not present

## 2019-02-12 DIAGNOSIS — E538 Deficiency of other specified B group vitamins: Secondary | ICD-10-CM | POA: Diagnosis not present

## 2019-02-12 LAB — FERRITIN: Ferritin: 92 ng/mL (ref 11–307)

## 2019-02-12 LAB — CBC WITH DIFFERENTIAL/PLATELET
Abs Immature Granulocytes: 0.04 10*3/uL (ref 0.00–0.07)
Basophils Absolute: 0.1 10*3/uL (ref 0.0–0.1)
Basophils Relative: 1 %
Eosinophils Absolute: 0.2 10*3/uL (ref 0.0–0.5)
Eosinophils Relative: 2 %
HCT: 43.4 % (ref 36.0–46.0)
Hemoglobin: 14.7 g/dL (ref 12.0–15.0)
Immature Granulocytes: 0 %
Lymphocytes Relative: 29 %
Lymphs Abs: 3.3 10*3/uL (ref 0.7–4.0)
MCH: 31.6 pg (ref 26.0–34.0)
MCHC: 33.9 g/dL (ref 30.0–36.0)
MCV: 93.3 fL (ref 80.0–100.0)
Monocytes Absolute: 0.7 10*3/uL (ref 0.1–1.0)
Monocytes Relative: 6 %
Neutro Abs: 7.1 10*3/uL (ref 1.7–7.7)
Neutrophils Relative %: 62 %
Platelets: 267 10*3/uL (ref 150–400)
RBC: 4.65 MIL/uL (ref 3.87–5.11)
RDW: 12.9 % (ref 11.5–15.5)
WBC: 11.4 10*3/uL — ABNORMAL HIGH (ref 4.0–10.5)
nRBC: 0 % (ref 0.0–0.2)

## 2019-02-13 ENCOUNTER — Telehealth: Payer: Self-pay

## 2019-02-13 ENCOUNTER — Inpatient Hospital Stay (HOSPITAL_BASED_OUTPATIENT_CLINIC_OR_DEPARTMENT_OTHER): Payer: Medicare Other | Admitting: Hematology and Oncology

## 2019-02-13 ENCOUNTER — Inpatient Hospital Stay: Payer: Medicare Other

## 2019-02-13 ENCOUNTER — Encounter: Payer: Self-pay | Admitting: Hematology and Oncology

## 2019-02-13 ENCOUNTER — Other Ambulatory Visit: Payer: Self-pay | Admitting: Urgent Care

## 2019-02-13 VITALS — BP 115/64 | HR 89 | Temp 97.9°F | Resp 18 | Ht 67.0 in | Wt 261.2 lb

## 2019-02-13 DIAGNOSIS — R5383 Other fatigue: Secondary | ICD-10-CM

## 2019-02-13 DIAGNOSIS — D509 Iron deficiency anemia, unspecified: Secondary | ICD-10-CM

## 2019-02-13 DIAGNOSIS — E538 Deficiency of other specified B group vitamins: Secondary | ICD-10-CM | POA: Diagnosis not present

## 2019-02-13 MED ORDER — CYANOCOBALAMIN 1000 MCG/ML IJ SOLN
1000.0000 ug | Freq: Once | INTRAMUSCULAR | Status: AC
  Administered 2019-02-13: 1000 ug via INTRAMUSCULAR

## 2019-02-13 MED ORDER — ROPINIROLE HCL 0.5 MG PO TABS: 0.5000 mg | ORAL_TABLET | Freq: Every day | ORAL | 0 refills | Status: DC

## 2019-02-13 NOTE — Progress Notes (Signed)
[facility] day:  02/13/2019   Chief Complaint: Amanda Webb is a 48 y.o. female with iron deficiency and B12 deficiency who is seen for a 6 month assessment on oral iron.   HPI:  The patient was last seen in the hematology clinic on 07/24/2018.  At that time, she felt "ok".  Exam was stable.  Hemoglobin was 13.3.  Ferritin was 58.  We discussed continuation of oral iron and B12.  We discussed sleep apnea testing.  Labs on 11/21/2018 revealed a hematocrit of 41.1., hemoglobin 13.4, MCV 93.4, platelets 262,000, WBC 10,900 with an ANC of 7000.  Ferritin was 73.  Iron saturation was 30 with a TIBC of 346.  B12 was 327.  CBC on 02/12/2019 revealed a hematocrit of 43.4, hemoglobin 14.7, and MCV 93.3.  Ferritin was 92.  Patient underwent formal PSG testing on 08/01/2018 at [place].  Report reviewed.  TST was 325.5 minutes.  Sleep efficiency 81.2%.  AHI 0.  Patient with elevated periodic limb movement index of 76.7 movements per hour, which provoked arousals and awakenings.  Study not consistent with OSAH syndrome, however there was significant evidence of RLS.  Recommendations included weight loss and formal treatment of patient's restless leg symptoms.  During the interim, patient remains fatigued. She notes that she is only sleeping 2 hours at a time. Patient with frequent nocturnal restless legs. She attributes her poor sleep to having "a bunch of kids" (grandchildren) in her bed. She states, "I think that I could sleep if I didn't have kids feet in my face all night". Patient spends the majority of her day "trying to rest and recover".  Overall sleep hygiene is poor.   She denies any shortness of breath or orthopnea. She has not experienced any episodes of chest pain. Patient denies bleeding; no hematochezia, melena, or gross hematuria. Patient denies that she has experienced any B symptoms. She denies any interval  infections.  Patient advises that she maintains an adequate appetite. She is eating well. Weight today is 261 lb 3.9 oz (118.5 kg), which compared to her last visit to the clinic, represents a 9 pound increase.     Patient denies pain in the clinic today.    Past Medical History:  Diagnosis Date  . Allergy   . Anemia   . Anxiety   . Arrhythmia   . Arthritis   . Cervical dysplasia   . Cystitis   . Depression   . GERD (gastroesophageal reflux disease)   . Gross hematuria   . Heart murmur   . HLD (hyperlipidemia)   . Hypertension   . Hypothyroid   . Tobacco abuse     Past Surgical History:  Procedure Laterality Date  . CARPAL TUNNEL RELEASE Bilateral 2011  . FOOT SURGERY Right 2013   Plantar fascia  . TONSILLECTOMY  1979    Family History  Problem Relation Age of Onset  . Depression Mother   . Mental illness Mother   . Alcohol abuse Mother   . Cancer Maternal Grandmother   . Diabetes Maternal Grandmother   . Ovarian cancer Maternal Grandmother   . Kidney disease Maternal Grandfather   . Diabetes Father   . Mental illness Father   . Depression Father   . Drug abuse Father   . Depression Paternal Grandfather   . Drug abuse Paternal Grandfather   . Bladder Cancer Neg Hx   . Breast cancer Neg Hx  Social History:  reports that she has been smoking cigarettes. She has a 15.00 pack-year smoking history. She has never used smokeless tobacco. She reports that she does not drink alcohol or use drugs.  She smokes 1/2 pack/day x 30 years.  Patient denies known exposures to radiation on toxins.  She is caring for her 65 year old grandson.  The patient is alone today.  Allergies:  Allergies  Allergen Reactions  . Bee Venom Anaphylaxis, Hives and Swelling    Carries Epi pen. Carries Epi pen.  . Cat Hair Extract Itching, Other (See Comments) and Swelling    Allergic to trees, nuts, wheat, grass, cats & dogs - itchy watery eyes, swelling. Uses Zyrtec & Flonase & Benadryl  if really bad. Used to get allergy shots. Allergic to trees, nuts, wheat, grass, cats & dogs - itchy watery eyes, swelling. Uses Zyrtec & Flonase & Benadryl if really bad. Used to get allergy shots.  . Tetracycline     Other reaction(s): Unknown  . Tetracyclines & Related Nausea And Vomiting  . Lac Bovis Diarrhea and Nausea And Vomiting  . Lactose Diarrhea and Nausea And Vomiting  . Milk Protein Diarrhea and Nausea And Vomiting  . Tape Other (See Comments) and Rash    Needs to use paper tape. Breaks out with severe rash, pulls skin off when using adhesive.    Current Medications: Current Outpatient Medications  Medication Sig Dispense Refill  . ACCU-CHEK FASTCLIX LANCETS MISC Check sugar 1-2 x daily as instructed 102 each 12  . ACCU-CHEK GUIDE test strip Check blood sugar up to 1-2x daily as instructed 100 each 12  . albuterol (PROVENTIL HFA;VENTOLIN HFA) 108 (90 Base) MCG/ACT inhaler INHALE 1 TO 2 PUFFS BY MOUTH AND INTO LUNGS EVERY 4 TO 6 HOURS AS NEEDED FOR SHORTNESS OF BREATH 18 g 2  . ARIPiprazole (ABILIFY) 2 MG tablet Take 1 tablet (2 mg total) by mouth daily.    Marland Kitchen atenolol (TENORMIN) 50 MG tablet Take 0.5 tablets (25 mg total) by mouth daily. 30 tablet 11  . Blood Glucose Monitoring Suppl (ACCU-CHEK GUIDE) w/Device KIT 1 kit by Does not apply route daily. Use to check blood sugar 1-2x daily as instructed 1 kit 0  . Cetirizine HCl (ZYRTEC ALLERGY) 10 MG CAPS     . DOK 100 MG capsule TAKE 1 CAPSULE BY MOUTH ONCE DAILY 30 capsule 0  . ferrous sulfate 325 (65 FE) MG EC tablet 1 tablet ('325mg'$ ) BID to TID based on tolerance. TAKE WITH SOURCE OF VITAMIN C. 90 tablet 2  . fluticasone (FLONASE) 50 MCG/ACT nasal spray USE 2 SPRAYS IN EACH NOSTRIL ONCE DAILY 48 g 1  . fluvoxaMINE (LUVOX) 100 MG tablet Take 150 mg by mouth 2 (two) times daily.    . hydrochlorothiazide (HYDRODIURIL) 12.5 MG tablet Take 1 tablet (12.5 mg total) by mouth daily. 90 tablet 1  . ketoconazole (NIZORAL) 2 % shampoo  APPLY TOPICALLY AS DIRECTED 120 mL 0  . lamoTRIgine (LAMICTAL) 200 MG tablet Take 200 mg by mouth daily.    Elmore Guise Device MISC Use to monitor Blood Sugar up tp three times daily for ICD10 E11.9 (Brand compatible to insurance plan and monitoring devise) 100 each 12  . levothyroxine (SYNTHROID, LEVOTHROID) 50 MCG tablet TAKE 1 TABLET BY MOUTH EVERY MORNING 90 tablet 3  . medroxyPROGESTERone (DEPO-PROVERA) 150 MG/ML injection Inject 1 mL (150 mg total) into the muscle every 3 (three) months. 1 mL 1  . nystatin ointment (MYCOSTATIN) Apply  1 application topically 2 (two) times daily. 30 g 0  . nystatin-triamcinolone (MYCOLOG II) cream Apply 1 application topically 2 (two) times daily. For 10-14 days 60 g 0  . pantoprazole (PROTONIX) 20 MG tablet TAKE 1 TABLET(20 MG) BY MOUTH DAILY 30 tablet 5  . QUEtiapine (SEROQUEL) 50 MG tablet Take 50 mg by mouth 3 (three) times daily.    . SUMAtriptan (IMITREX) 5 MG/ACT nasal spray Place 1 spray (5 mg total) every 2 (two) hours as needed into the nose for migraine. Max 3 doses in 24 hours 1 Inhaler 0  . triamcinolone ointment (KENALOG) 0.1 % Apply topically 2 (two) times daily. 30 g 0   No current facility-administered medications for this visit.     Review of Systems  Constitutional: Positive for malaise/fatigue. Negative for diaphoresis, fever and weight loss (up 9 pounds).  HENT: Negative.  Negative for congestion, ear discharge, ear pain, nosebleeds, sinus pain and sore throat.   Eyes: Negative.  Negative for blurred vision, double vision, photophobia and pain.  Respiratory: Negative.  Negative for cough, hemoptysis, sputum production and shortness of breath.   Cardiovascular: Negative.  Negative for chest pain, palpitations, orthopnea, leg swelling and PND.  Gastrointestinal: Negative.  Negative for abdominal pain, blood in stool, constipation, diarrhea, melena, nausea and vomiting.  Genitourinary: Negative for dysuria, frequency, hematuria and urgency.   Musculoskeletal: Positive for joint pain (OA in hands). Negative for back pain, falls and myalgias.  Skin: Negative for itching and rash.  Neurological: Positive for headaches (chronic). Negative for dizziness, tremors, focal weakness and weakness.       Restless leg syndrome.  Endo/Heme/Allergies: Does not bruise/bleed easily.       HYPOthyroidism - on levothyroxine.  Psychiatric/Behavioral: Positive for depression. Negative for memory loss and suicidal ideas. The patient is not nervous/anxious and does not have insomnia (poor sleep).   All other systems reviewed and are negative.  Performance status (ECOG): 1-2  Vital Signs BP 115/64 (BP Location: Right Arm, Patient Position: Sitting)   Pulse 78   Temp 97.9 F (36.6 C) (Tympanic)   Resp (!) 98   Ht '5\' 7"'$  (1.702 m)   Wt 261 lb 3.9 oz (118.5 kg)   LMP 01/21/2019 (Exact Date)   SpO2 100%   BMI 40.92 kg/m   Physical Exam  Constitutional: She is oriented to person, place, and time and well-developed, well-nourished, and in no distress. No distress.  HENT:  Head: Normocephalic and atraumatic.  Mouth/Throat: Oropharynx is clear and moist and mucous membranes are normal. No oropharyngeal exudate.  Strawberry blonde hair pulled back.  Eyes: Pupils are equal, round, and reactive to light. Conjunctivae and EOM are normal. No scleral icterus.  Blue eyes.  Neck: Normal range of motion. Neck supple. No JVD present.  Cardiovascular: Normal rate, regular rhythm, normal heart sounds and intact distal pulses. Exam reveals no gallop and no friction rub.  No murmur heard. Pulmonary/Chest: Effort normal and breath sounds normal. No tachypnea. No respiratory distress. She has no wheezes. She has no rales.  Respiratory rate normal.  Abdominal: Soft. Bowel sounds are normal. She exhibits no distension and no mass. There is no abdominal tenderness. There is no rebound and no guarding.  Musculoskeletal:        General: No tenderness or edema.   Lymphadenopathy:    She has no cervical adenopathy.  Neurological: She is alert and oriented to person, place, and time. Gait normal.  Skin: Skin is warm and dry. No rash noted.  She is not diaphoretic. No erythema. No pallor.  Psychiatric: Mood, affect and judgment normal.  Nursing note and vitals reviewed.   Appointment on 02/12/2019  Component Date Value Ref Range Status  . Ferritin 02/12/2019 92  11 - 307 ng/mL Final   Performed at [facility], [address] [place], [place] [address]  . WBC 02/12/2019 11.4* 4.0 - 10.5 K/uL Final  . RBC 02/12/2019 4.65  3.87 - 5.11 MIL/uL Final  . Hemoglobin 02/12/2019 14.7  12.0 - 15.0 g/dL Final  . HCT 02/12/2019 43.4  36.0 - 46.0 % Final  . MCV 02/12/2019 93.3  80.0 - 100.0 fL Final  . MCH 02/12/2019 31.6  26.0 - 34.0 pg Final  . MCHC 02/12/2019 33.9  30.0 - 36.0 g/dL Final  . RDW 02/12/2019 12.9  11.5 - 15.5 % Final  . Platelets 02/12/2019 267  150 - 400 K/uL Final  . nRBC 02/12/2019 0.0  0.0 - 0.2 % Final  . Neutrophils Relative % 02/12/2019 62  % Final  . Neutro Abs 02/12/2019 7.1  1.7 - 7.7 K/uL Final  . Lymphocytes Relative 02/12/2019 29  % Final  . Lymphs Abs 02/12/2019 3.3  0.7 - 4.0 K/uL Final  . Monocytes Relative 02/12/2019 6  % Final  . Monocytes Absolute 02/12/2019 0.7  0.1 - 1.0 K/uL Final  . Eosinophils Relative 02/12/2019 2  % Final  . Eosinophils Absolute 02/12/2019 0.2  0.0 - 0.5 K/uL Final  . Basophils Relative 02/12/2019 1  % Final  . Basophils Absolute 02/12/2019 0.1  0.0 - 0.1 K/uL Final  . Immature Granulocytes 02/12/2019 0  % Final  . Abs Immature Granulocytes 02/12/2019 0.04  0.00 - 0.07 K/uL Final   Performed at [facility], [address] [place], [place] [address]    Assessment:  Amanda Webb is a 48 y.o. female with a mild normocytic anemia.  She has iron deficiency.  Hemoglobin was normal until 03/12/2018.  She notes scant vaginal bleeding on DepoProvera.  She denies any  hematuria, melena or hematochezia.  Diet appears good.  She has ice pica.  CBC on 03/12/2018 revealed a hematocrit of 32.7, hemoglobin 11.2, and MCV 82.4.  Normal studies included:  Creatinine, LFTs, TSH and free T4.  Ferritin was 11.  Iron saturation was 8% and TIBC 362.  Work-up on 03/24/2018 revealed a hematocrit of 35.7, hemoglobin 11.9, and MCV 84.2.  Folate was 17.2.  B12 was 296 (low normal).  MMA was normal.  Intrinsic factor antibody and antiparietal cell antibody was normal on 06/12/2018.  Retic was 1.3%.  Guaiac cards were negative x 3.  She began oral B12 on 04/13/2018.  B12 was 502 on 06/12/2018 and 327 on 11/21/2018. Began weekly B12 injections x 6 on 11/28/2018.  Ferritin has been followed: 8 on 04/28/2012, 20 on 09/03/2012, 18 on 09/28/2013, 11 on 03/12/2018, 38 on 06/11/2018, 58 on 07/24/2018, 73 on 11/21/2018, and 92 on 02/12/2019.  She is on oral iron BID.  PSG testing on 08/01/2018 at [place]. Study not consistent with OSAH syndrome, however there was significant evidence of RLS. Patient with elevated periodic limb movement index of 76.7 movements per hour, which provoked arousals and awakenings. Recommendations included weight loss and formal treatment of patient's restless leg symptoms.  Symptomatically, she remains fatigued. She is not sleeping well.  Sleep study was negative for apnea. Sleep hygiene poor, with restless legs felt to be contributory. Patient denies bleeding; no hematochezia, melena,  or gross hematuria.  Exam is stable.   Plan:  1. Review labs from 02/12/2019.  2. Iron deficiency anemia  Labs reviewed.    No anemia or microcytosis.  Ferritin stable at 92 ng/mL.  Currently on oral iron twice daily - DISCONTINUE 3. B12 deficiency  B12 level 327 pg/mL when last checked on 11/21/2018.  Continues on monthly parenteral B12 supplementation - DUE TODAY. 4. Fatigue secondary to poor sleep quality  Review PSG testing from 08/01/2018  TST was  325.5 minutes.  Sleep efficiency 81.2%.  AHI 0.   Study not consistent with OSAH syndrome, however there was significant evidence of RLS.   Recommendations included weight loss and formal treatment of patient's restless leg symptoms  Discuss RLS as being contributory to patient's poor sleep, thus resulting in noted fatigue.   Communicated personally with PCP Parks Ranger, MD) via Epic to discuss further.    Discuss PSG results and recommendations.   Discuss consideration of pharmacological intervention, such as ropinirole, for her nocturnal restless legs.  Collaborative decision was make to start medication for significant RLS symptoms today so that PCP could re-evaluate at next RTC and titrate dose as needed.  Rx: ropinirole 0.5 mg qhs (Disp #30) sent   5. RTC in 3 months for MD assessment and labs (CBC with diff, ferritin - day before)   Honor Loh, NP 02/13/2019, 4:35 PM    I saw and evaluated the patient, participating in the key portions of the service and reviewing pertinent diagnostic studies and records.  I reviewed the nurse practitioner's note and agree with the findings and the plan.  The assessment and plan were discussed with the patient.  A few questions were asked by the patient and answered.   Nolon Stalls, MD 02/13/2019, 4:35 PM

## 2019-02-13 NOTE — Telephone Encounter (Signed)
Spoke with Ms Amanda Webb to inform her, Per [name] he has called in Requip 0.5 mg QHS for the patient restless legs, Per Dr Raliegh Ip it is ok to start the patient and Dr Raliegh Ip will titrate as needed. I have also informed her that the Requip has been sent to her pharmacy. The patient was understanding and agreeable.

## 2019-02-13 NOTE — Patient Instructions (Signed)
Cyanocobalamin, Vitamin B12 injection What is this medicine? CYANOCOBALAMIN (sye an oh koe BAL a min) is a man made form of vitamin B12. Vitamin B12 is used in the growth of healthy blood cells, nerve cells, and proteins in the body. It also helps with the metabolism of fats and carbohydrates. This medicine is used to treat people who can not absorb vitamin B12. This medicine may be used for other purposes; ask your health care provider or pharmacist if you have questions. COMMON BRAND NAME(S): B-12 Compliance Kit, B-12 Injection Kit, Cyomin, LA-12, Nutri-Twelve, Physicians EZ Use B-12, Primabalt What should I tell my health care provider before I take this medicine? They need to know if you have any of these conditions: -kidney disease -Leber's disease -megaloblastic anemia -an unusual or allergic reaction to cyanocobalamin, cobalt, other medicines, foods, dyes, or preservatives -pregnant or trying to get pregnant -breast-feeding How should I use this medicine? This medicine is injected into a muscle or deeply under the skin. It is usually given by a health care professional in a clinic or doctor's office. However, your doctor may teach you how to inject yourself. Follow all instructions. Talk to your pediatrician regarding the use of this medicine in children. Special care may be needed. Overdosage: If you think you have taken too much of this medicine contact a poison control center or emergency room at once. NOTE: This medicine is only for you. Do not share this medicine with others. What if I miss a dose? If you are given your dose at a clinic or doctor's office, call to reschedule your appointment. If you give your own injections and you miss a dose, take it as soon as you can. If it is almost time for your next dose, take only that dose. Do not take double or extra doses. What may interact with this medicine? -colchicine -heavy alcohol intake This list may not describe all possible  interactions. Give your health care provider a list of all the medicines, herbs, non-prescription drugs, or dietary supplements you use. Also tell them if you smoke, drink alcohol, or use illegal drugs. Some items may interact with your medicine. What should I watch for while using this medicine? Visit your doctor or health care professional regularly. You may need blood work done while you are taking this medicine. You may need to follow a special diet. Talk to your doctor. Limit your alcohol intake and avoid smoking to get the best benefit. What side effects may I notice from receiving this medicine? Side effects that you should report to your doctor or health care professional as soon as possible: -allergic reactions like skin rash, itching or hives, swelling of the face, lips, or tongue -blue tint to skin -chest tightness, pain -difficulty breathing, wheezing -dizziness -red, swollen painful area on the leg Side effects that usually do not require medical attention (report to your doctor or health care professional if they continue or are bothersome): -diarrhea -headache This list may not describe all possible side effects. Call your doctor for medical advice about side effects. You may report side effects to FDA at 1-800-FDA-1088. Where should I keep my medicine? Keep out of the reach of children. Store at room temperature between 15 and 30 degrees C (59 and 85 degrees F). Protect from light. Throw away any unused medicine after the expiration date. NOTE: This sheet is a summary. It may not cover all possible information. If you have questions about this medicine, talk to your doctor, pharmacist, or   health care provider.  2019 Elsevier/Gold Standard (2008-03-15 22:10:20)  

## 2019-02-13 NOTE — Progress Notes (Signed)
No new changes noted today 

## 2019-02-27 ENCOUNTER — Ambulatory Visit: Payer: Medicare Other | Admitting: Family Medicine

## 2019-03-02 ENCOUNTER — Ambulatory Visit: Payer: Medicare Other

## 2019-03-02 NOTE — Telephone Encounter (Signed)
Can you look into this when you get a chance later this week? - telephone note on 01/14/19 says that we faxed this form out, after they sent [place] the correct one.  Now she has her test supplies but says they need another authorization?  I'm not sure if we need [facility] or her Insurance to help [place].  [name], DO [facility] 03/02/2019, 5:46 PM

## 2019-03-12 ENCOUNTER — Other Ambulatory Visit: Payer: Self-pay

## 2019-03-12 ENCOUNTER — Telehealth: Payer: Self-pay

## 2019-03-12 NOTE — Telephone Encounter (Signed)
Patient would like to reschedule tomorrow's appointment to a later date. Please contact patient. Thank you.

## 2019-03-13 ENCOUNTER — Inpatient Hospital Stay: Payer: Medicare Other

## 2019-03-20 ENCOUNTER — Inpatient Hospital Stay: Payer: Medicare Other | Attending: Hematology and Oncology

## 2019-03-26 ENCOUNTER — Other Ambulatory Visit: Payer: Self-pay

## 2019-03-26 ENCOUNTER — Inpatient Hospital Stay: Payer: Medicare Other | Attending: Hematology and Oncology

## 2019-03-26 VITALS — Temp 99.2°F | Resp 18

## 2019-03-26 DIAGNOSIS — E538 Deficiency of other specified B group vitamins: Secondary | ICD-10-CM | POA: Diagnosis not present

## 2019-03-26 DIAGNOSIS — D509 Iron deficiency anemia, unspecified: Secondary | ICD-10-CM

## 2019-03-26 MED ORDER — CYANOCOBALAMIN 1000 MCG/ML IJ SOLN
1000.0000 ug | Freq: Once | INTRAMUSCULAR | Status: AC
  Administered 2019-03-26: 1000 ug via INTRAMUSCULAR

## 2019-03-26 MED ORDER — CYANOCOBALAMIN 1000 MCG/ML IJ SOLN
INTRAMUSCULAR | Status: AC
  Filled 2019-03-26: qty 1

## 2019-03-26 NOTE — Patient Instructions (Signed)
Cyanocobalamin, Vitamin B12 injection What is this medicine? CYANOCOBALAMIN (sye an oh koe BAL a min) is a man made form of vitamin B12. Vitamin B12 is used in the growth of healthy blood cells, nerve cells, and proteins in the body. It also helps with the metabolism of fats and carbohydrates. This medicine is used to treat people who can not absorb vitamin B12. This medicine may be used for other purposes; ask your health care provider or pharmacist if you have questions. COMMON BRAND NAME(S): B-12 Compliance Kit, B-12 Injection Kit, Cyomin, LA-12, Nutri-Twelve, Physicians EZ Use B-12, Primabalt What should I tell my health care provider before I take this medicine? They need to know if you have any of these conditions: -kidney disease -Leber's disease -megaloblastic anemia -an unusual or allergic reaction to cyanocobalamin, cobalt, other medicines, foods, dyes, or preservatives -pregnant or trying to get pregnant -breast-feeding How should I use this medicine? This medicine is injected into a muscle or deeply under the skin. It is usually given by a health care professional in a clinic or doctor's office. However, your doctor may teach you how to inject yourself. Follow all instructions. Talk to your pediatrician regarding the use of this medicine in children. Special care may be needed. Overdosage: If you think you have taken too much of this medicine contact a poison control center or emergency room at once. NOTE: This medicine is only for you. Do not share this medicine with others. What if I miss a dose? If you are given your dose at a clinic or doctor's office, call to reschedule your appointment. If you give your own injections and you miss a dose, take it as soon as you can. If it is almost time for your next dose, take only that dose. Do not take double or extra doses. What may interact with this medicine? -colchicine -heavy alcohol intake This list may not describe all possible  interactions. Give your health care provider a list of all the medicines, herbs, non-prescription drugs, or dietary supplements you use. Also tell them if you smoke, drink alcohol, or use illegal drugs. Some items may interact with your medicine. What should I watch for while using this medicine? Visit your doctor or health care professional regularly. You may need blood work done while you are taking this medicine. You may need to follow a special diet. Talk to your doctor. Limit your alcohol intake and avoid smoking to get the best benefit. What side effects may I notice from receiving this medicine? Side effects that you should report to your doctor or health care professional as soon as possible: -allergic reactions like skin rash, itching or hives, swelling of the face, lips, or tongue -blue tint to skin -chest tightness, pain -difficulty breathing, wheezing -dizziness -red, swollen painful area on the leg Side effects that usually do not require medical attention (report to your doctor or health care professional if they continue or are bothersome): -diarrhea -headache This list may not describe all possible side effects. Call your doctor for medical advice about side effects. You may report side effects to FDA at 1-800-FDA-1088. Where should I keep my medicine? Keep out of the reach of children. Store at room temperature between 15 and 30 degrees C (59 and 85 degrees F). Protect from light. Throw away any unused medicine after the expiration date. NOTE: This sheet is a summary. It may not cover all possible information. If you have questions about this medicine, talk to your doctor, pharmacist, or   health care provider.  2019 Elsevier/Gold Standard (2008-03-15 22:10:20)

## 2019-04-04 ENCOUNTER — Other Ambulatory Visit: Payer: Self-pay | Admitting: Family Medicine

## 2019-04-10 ENCOUNTER — Inpatient Hospital Stay: Payer: Medicare Other

## 2019-04-15 ENCOUNTER — Telehealth: Payer: Self-pay

## 2019-04-15 NOTE — Telephone Encounter (Signed)
Coronavirus (COVID-19) Are you at risk?  Are you at risk for the Coronavirus (COVID-19)?  To be considered HIGH RISK for Coronavirus (COVID-19), you have to meet the following criteria:  . Traveled to [place], [place], [place], [place] or [place]; or in the [place] to [place], [place], [place], or [place]; and have fever, cough, and shortness of breath within the last 2 weeks of travel OR . Been in close contact with a person diagnosed with COVID-19 within the last 2 weeks and have fever, cough, and shortness of breath . IF YOU DO NOT MEET THESE CRITERIA, YOU ARE CONSIDERED LOW RISK FOR COVID-19.  What to do if you are HIGH RISK for COVID-19?  . If you are having a medical emergency, call 911. . Seek medical care right away. Before you go to a doctor's office, urgent care or emergency department, call ahead and tell them about your recent travel, contact with someone diagnosed with COVID-19, and your symptoms. You should receive instructions from your physician's office regarding next steps of care.  . When you arrive at healthcare provider, tell the healthcare staff immediately you have returned from visiting [place], [place], [place], [place] or [place]; or traveled in the [place] to [place], [place], [place], or [place]; in the last two weeks or you have been in close contact with a person diagnosed with COVID-19 in the last 2 weeks.   . Tell the health care staff about your symptoms: fever, cough and shortness of breath. . After you have been seen by a medical provider, you will be either: o Tested for (COVID-19) and discharged home on quarantine except to seek medical care if symptoms worsen, and asked to  - Stay home and avoid contact with others until you get your results (4-5 days)  - Avoid travel on public transportation if possible (such as bus, train, or airplane) or o Sent to the Emergency Department by EMS for evaluation, COVID-19 testing, and possible  admission depending on your condition and test results.  What to do if you are LOW RISK for COVID-19?  Reduce your risk of any infection by using the same precautions used for avoiding the common cold or flu:  . Wash your hands often with soap and warm water for at least 20 seconds.  If soap and water are not readily available, use an alcohol-based hand sanitizer with at least 60% alcohol.  . If coughing or sneezing, cover your mouth and nose by coughing or sneezing into the elbow areas of your shirt or coat, into a tissue or into your sleeve (not your hands). . Avoid shaking hands with others and consider head nods or verbal greetings only. . Avoid touching your eyes, nose, or mouth with unwashed hands.  . Avoid close contact with people who are sick. . Avoid places or events with large numbers of people in one location, like concerts or sporting events. . Carefully consider travel plans you have or are making. . If you are planning any travel outside or inside the [place], visit the CDC's Travelers' Health webpage for the latest health notices. . If you have some symptoms but not all symptoms, continue to monitor at home and seek medical attention if your symptoms worsen. . If you are having a medical emergency, call 911.   ADDITIONAL HEALTHCARE OPTIONS FOR PATIENTS  [facility] / e-Visit: [email or web address]         [facility]: [phone]  [facility]: [phone]                   [facility]: [phone]   Prescreened. Neg .cm 

## 2019-04-16 ENCOUNTER — Other Ambulatory Visit: Payer: Self-pay

## 2019-04-16 ENCOUNTER — Ambulatory Visit (INDEPENDENT_AMBULATORY_CARE_PROVIDER_SITE_OTHER): Payer: Medicare Other | Admitting: Obstetrics and Gynecology

## 2019-04-16 VITALS — BP 112/65 | HR 91 | Ht 67.0 in | Wt 252.7 lb

## 2019-04-16 DIAGNOSIS — Z3042 Encounter for surveillance of injectable contraceptive: Secondary | ICD-10-CM

## 2019-04-16 MED ORDER — MEDROXYPROGESTERONE ACETATE 150 MG/ML IM SUSP
150.0000 mg | Freq: Once | INTRAMUSCULAR | Status: AC
  Administered 2019-04-16: 09:00:00 150 mg via INTRAMUSCULAR

## 2019-04-16 NOTE — Progress Notes (Signed)
Date last pap: na Last Depo-Provera: 01/28/19 Side Effects if any: na Serum HCG indicated? na Depo-Provera 150 mg IM given by: Keturah Barre, CMA Next appointment due July 16-July 16, 2019 BP 112/65   Pulse 91   Ht 5\' 7"  (1.702 m)   Wt 252 lb 11.2 oz (114.6 kg)   BMI 39.58 kg/m

## 2019-04-17 ENCOUNTER — Inpatient Hospital Stay: Payer: Medicare Other

## 2019-04-24 ENCOUNTER — Ambulatory Visit: Payer: Medicare Other

## 2019-04-24 DIAGNOSIS — F4310 Post-traumatic stress disorder, unspecified: Secondary | ICD-10-CM | POA: Diagnosis not present

## 2019-04-28 ENCOUNTER — Other Ambulatory Visit: Payer: Self-pay | Admitting: Family Medicine

## 2019-04-28 DIAGNOSIS — K219 Gastro-esophageal reflux disease without esophagitis: Secondary | ICD-10-CM

## 2019-05-07 ENCOUNTER — Other Ambulatory Visit: Payer: Medicare Other

## 2019-05-08 ENCOUNTER — Other Ambulatory Visit: Payer: Medicare Other

## 2019-05-08 ENCOUNTER — Ambulatory Visit: Payer: Medicare Other | Admitting: Hematology and Oncology

## 2019-05-08 ENCOUNTER — Ambulatory Visit: Payer: Medicare Other

## 2019-05-12 ENCOUNTER — Other Ambulatory Visit: Payer: Self-pay | Admitting: Family Medicine

## 2019-05-14 ENCOUNTER — Other Ambulatory Visit: Payer: Medicare Other

## 2019-05-15 ENCOUNTER — Ambulatory Visit: Payer: Medicare Other

## 2019-05-19 ENCOUNTER — Inpatient Hospital Stay: Payer: Medicare Other

## 2019-05-19 ENCOUNTER — Other Ambulatory Visit: Payer: Self-pay

## 2019-05-19 ENCOUNTER — Inpatient Hospital Stay: Payer: Medicare Other | Attending: Hematology and Oncology

## 2019-05-19 DIAGNOSIS — E538 Deficiency of other specified B group vitamins: Secondary | ICD-10-CM | POA: Diagnosis not present

## 2019-05-19 DIAGNOSIS — G2581 Restless legs syndrome: Secondary | ICD-10-CM | POA: Diagnosis not present

## 2019-05-19 DIAGNOSIS — D509 Iron deficiency anemia, unspecified: Secondary | ICD-10-CM | POA: Insufficient documentation

## 2019-05-19 LAB — CBC WITH DIFFERENTIAL/PLATELET
Abs Immature Granulocytes: 0.03 10*3/uL (ref 0.00–0.07)
Basophils Absolute: 0 10*3/uL (ref 0.0–0.1)
Basophils Relative: 0 %
Eosinophils Absolute: 0.2 10*3/uL (ref 0.0–0.5)
Eosinophils Relative: 2 %
HCT: 43 % (ref 36.0–46.0)
Hemoglobin: 14.8 g/dL (ref 12.0–15.0)
Immature Granulocytes: 0 %
Lymphocytes Relative: 34 %
Lymphs Abs: 3.3 10*3/uL (ref 0.7–4.0)
MCH: 32.2 pg (ref 26.0–34.0)
MCHC: 34.4 g/dL (ref 30.0–36.0)
MCV: 93.5 fL (ref 80.0–100.0)
Monocytes Absolute: 0.6 10*3/uL (ref 0.1–1.0)
Monocytes Relative: 7 %
Neutro Abs: 5.4 10*3/uL (ref 1.7–7.7)
Neutrophils Relative %: 57 %
Platelets: 243 10*3/uL (ref 150–400)
RBC: 4.6 MIL/uL (ref 3.87–5.11)
RDW: 13.2 % (ref 11.5–15.5)
WBC: 9.5 10*3/uL (ref 4.0–10.5)
nRBC: 0 % (ref 0.0–0.2)

## 2019-05-19 LAB — FERRITIN: Ferritin: 72 ng/mL (ref 11–307)

## 2019-05-19 MED ORDER — CYANOCOBALAMIN 1000 MCG/ML IJ SOLN
1000.0000 ug | Freq: Once | INTRAMUSCULAR | Status: AC
  Administered 2019-05-19: 1000 ug via INTRAMUSCULAR

## 2019-05-20 ENCOUNTER — Encounter: Payer: Self-pay | Admitting: Hematology and Oncology

## 2019-05-20 ENCOUNTER — Inpatient Hospital Stay (HOSPITAL_BASED_OUTPATIENT_CLINIC_OR_DEPARTMENT_OTHER): Payer: Medicare Other | Admitting: Hematology and Oncology

## 2019-05-20 ENCOUNTER — Other Ambulatory Visit: Payer: Medicare Other

## 2019-05-20 DIAGNOSIS — Z79899 Other long term (current) drug therapy: Secondary | ICD-10-CM

## 2019-05-20 DIAGNOSIS — E039 Hypothyroidism, unspecified: Secondary | ICD-10-CM | POA: Diagnosis not present

## 2019-05-20 DIAGNOSIS — Z87891 Personal history of nicotine dependence: Secondary | ICD-10-CM | POA: Diagnosis not present

## 2019-05-20 DIAGNOSIS — E538 Deficiency of other specified B group vitamins: Secondary | ICD-10-CM

## 2019-05-20 DIAGNOSIS — I10 Essential (primary) hypertension: Secondary | ICD-10-CM

## 2019-05-20 DIAGNOSIS — D509 Iron deficiency anemia, unspecified: Secondary | ICD-10-CM

## 2019-05-20 NOTE — Progress Notes (Signed)
Confirms name, DOB, and address. Denies any concerns at this time.

## 2019-05-20 NOTE — Progress Notes (Signed)
[facility]  [address], [address] [place], [place] [address] Phone: [phone]  Fax: [phone]   Telemedicine Office Visit:  05/20/2019  Referring physician: [name] *  I connected with [name] on 05/20/2019 at 8:49 AM by videoconferencing and verified that I was speaking with the correct person using 2 identifiers.  The patient was at [facility], and despite repeated offers to change the appointment time due to privacy concerns, she preferred and consented to having the visit in [place].  I discussed the limitations, risk, security and privacy concerns of performing an evaluation and management service by videoconferencing and the availability of in person appointments.  I also discussed with the patient that there may be a patient responsible charge related to this service.  The patient expressed understanding and agreed to proceed.   Chief Complaint: Amanda Webb is a 48 y.o. female with iron deficiency and B12 deficiency who is seen for a 3 month assessment.  HPI: The patient was last seen in the hematology clinic on 02/13/2019.  At that time, she was fatigued. She was not sleeping well. Sleep study was negative for apnea. Sleep hygiene poor, with restless legs was felt to be contributory. Patient denied bleeding; no hematochezia, melena, or gross hematuria. Exam was stable. Hemoglobin was 14.7, HCT 43.4, MCV 93.3, Ferritin 92. She started Requip 0.5 mg QHS for restless legs. She discontinued oral iron.   She received vitamin B12 injections on 02/13/2019, 03/26/2019, 05/19/2019. She could not receive them monthly due to the COVID-19 pandemic.   She contacted her PCP on 02/26/2019 having trouble holding her urine, and while urinating she experienced cramping and tenderness in her back. She reported having a slight fever of 99.6.  She was not seen due to COVID-19 concerns.  She contacted her PCP again on 04/06/2019 and reported vaginal  spotting after sex.  She was advised to come in if she started having significant pain or heavier bleeding.   CBC on 05/19/2019: WBC 9,500, hemoglobin 14.8, hematocrit 43.0, platelet 243,000.  Ferritin was 72.   During the interim, she reports "I'm fine, everything's good right now." She denies joint pain. Headaches are stable. Restless legs are intermittent, but tolerable. She is sleeping better.  She is in a new relationship, which she contributed to feeling better overall.    Past Medical History:  Diagnosis Date  . Allergy   . Anemia   . Anxiety   . Arrhythmia   . Arthritis   . Cervical dysplasia   . Cystitis   . Depression   . GERD (gastroesophageal reflux disease)   . Gross hematuria   . Heart murmur   . HLD (hyperlipidemia)   . Hypertension   . Hypothyroid   . Tobacco abuse     Past Surgical History:  Procedure Laterality Date  . CARPAL TUNNEL RELEASE Bilateral 2011  . FOOT SURGERY Right 2013   Plantar fascia  . TONSILLECTOMY  1979    Family History  Problem Relation Age of Onset  . Depression Mother   . Mental illness Mother   . Alcohol abuse Mother   . Cancer Maternal Grandmother   . Diabetes Maternal Grandmother   . Ovarian cancer Maternal Grandmother   . Kidney disease Maternal Grandfather   . Diabetes Father   . Mental illness Father   . Depression Father   . Drug abuse Father   . Depression Paternal Grandfather   . Drug abuse Paternal Grandfather   . Bladder  Cancer Neg Hx   . Breast cancer Neg Hx     Social History:  reports that she has been smoking cigarettes. She has a 15.00 pack-year smoking history. She has never used smokeless tobacco. She reports that she does not drink alcohol or use drugs. She smokes 1/2 pack/day x 30 years.  Patient denies known exposures to radiation on toxins.  She is caring for her 22 year old grandson.  The patient is alone today.  Participants in the patient's visit and their role in the encounter included the patient  and Waymon Budge, RN today.  The intake visit was provided by Waymon Budge, RN.  Allergies:  Allergies  Allergen Reactions  . Bee Venom Anaphylaxis, Hives and Swelling    Carries Epi pen. Carries Epi pen.  . Cat Hair Extract Itching, Other (See Comments) and Swelling    Allergic to trees, nuts, wheat, grass, cats & dogs - itchy watery eyes, swelling. Uses Zyrtec & Flonase & Benadryl if really bad. Used to get allergy shots. Allergic to trees, nuts, wheat, grass, cats & dogs - itchy watery eyes, swelling. Uses Zyrtec & Flonase & Benadryl if really bad. Used to get allergy shots.  . Tetracycline     Other reaction(s): Unknown  . Tetracyclines & Related Nausea And Vomiting  . Lac Bovis Diarrhea and Nausea And Vomiting  . Lactose Diarrhea and Nausea And Vomiting  . Milk Protein Diarrhea and Nausea And Vomiting  . Tape Other (See Comments) and Rash    Needs to use paper tape. Breaks out with severe rash, pulls skin off when using adhesive.    Current Medications: Current Outpatient Medications  Medication Sig Dispense Refill  . albuterol (PROVENTIL HFA;VENTOLIN HFA) 108 (90 Base) MCG/ACT inhaler INHALE 1 TO 2 PUFFS BY MOUTH AND INTO LUNGS EVERY 4 TO 6 HOURS AS NEEDED FOR SHORTNESS OF BREATH 18 g 2  . ARIPiprazole (ABILIFY) 2 MG tablet Take 1 tablet (2 mg total) by mouth daily.    Marland Kitchen atenolol (TENORMIN) 50 MG tablet Take 0.5 tablets (25 mg total) by mouth daily. 30 tablet 11  . Cetirizine HCl (ZYRTEC ALLERGY) 10 MG CAPS Take 1 capsule by mouth daily.     Marland Kitchen DOK 100 MG capsule TAKE 1 CAPSULE BY MOUTH ONCE DAILY (Patient taking differently: Take 100 mg by mouth daily as needed. ) 30 capsule 0  . ferrous sulfate 325 (65 FE) MG EC tablet 1 tablet (325mg ) BID to TID based on tolerance. TAKE WITH SOURCE OF VITAMIN C. 90 tablet 2  . fluticasone (FLONASE) 50 MCG/ACT nasal spray USE 2 SPRAYS IN EACH NOSTRIL ONCE DAILY 48 g 1  . fluvoxaMINE (LUVOX) 100 MG tablet Take 150 mg by mouth 2 (two)  times daily.    . hydrochlorothiazide (HYDRODIURIL) 12.5 MG tablet Take 1 tablet (12.5 mg total) by mouth daily. 90 tablet 1  . ketoconazole (NIZORAL) 2 % shampoo APPLY TPICALLY TO AFFECTED AREA DAILY AS DIRECTED 120 mL 0  . lamoTRIgine (LAMICTAL) 200 MG tablet Take 200 mg by mouth daily.    Marland Kitchen levothyroxine (SYNTHROID, LEVOTHROID) 50 MCG tablet TAKE 1 TABLET BY MOUTH EVERY MORNING 90 tablet 3  . medroxyPROGESTERone (DEPO-PROVERA) 150 MG/ML injection Inject 1 mL (150 mg total) into the muscle every 3 (three) months. 1 mL 1  . pantoprazole (PROTONIX) 20 MG tablet TAKE 1 TABLET(20 MG) BY MOUTH DAILY 30 tablet 5  . QUEtiapine (SEROQUEL) 50 MG tablet Take 50 mg by mouth 3 (three) times daily.    Marland Kitchen  rOPINIRole (REQUIP) 0.5 MG tablet Take 1 tablet (0.5 mg total) by mouth at bedtime. 30 tablet 0  . SUMAtriptan (IMITREX) 5 MG/ACT nasal spray Place 1 spray (5 mg total) every 2 (two) hours as needed into the nose for migraine. Max 3 doses in 24 hours 1 Inhaler 0  . triamcinolone ointment (KENALOG) 0.1 % Apply topically 2 (two) times daily. 30 g 0   No current facility-administered medications for this visit.     Review of Systems  Constitutional: Negative for diaphoresis, fever, malaise/fatigue and weight loss (stable).       "I'm fine, everything's good right now."  HENT: Negative.  Negative for congestion, ear discharge, ear pain, nosebleeds, sinus pain and sore throat.   Eyes: Negative.  Negative for blurred vision, double vision, photophobia and pain.  Respiratory: Negative.  Negative for cough, hemoptysis, sputum production and shortness of breath.   Cardiovascular: Negative.  Negative for chest pain, palpitations, orthopnea, leg swelling and PND.  Gastrointestinal: Negative.  Negative for abdominal pain, blood in stool, constipation, diarrhea, melena, nausea and vomiting.  Genitourinary: Negative for dysuria, frequency, hematuria and urgency.  Musculoskeletal: Negative for back pain, falls, joint  pain (OA in hands, stable) and myalgias.  Skin: Negative for itching and rash.  Neurological: Positive for headaches (chronic, stable). Negative for dizziness, tremors, focal weakness and weakness.       Restless leg syndrome.  Endo/Heme/Allergies: Does not bruise/bleed easily.       HYPOthyroidism - on levothyroxine.  Psychiatric/Behavioral: Negative for depression, memory loss and suicidal ideas. The patient is not nervous/anxious and does not have insomnia (poor sleep, improved).   All other systems reviewed and are negative.   Performance status (ECOG):  1  Physical Exam  Constitutional: She appears well-developed and well-nourished. No distress.  HENT:  Head: Normocephalic and atraumatic.  Short styled blonde hair.  Eyes: Pupils are equal, round, and reactive to light. Conjunctivae and EOM are normal. No scleral icterus.  Blue eyes.  Skin: She is not diaphoretic.  Psychiatric: She has a normal mood and affect. Her behavior is normal. Judgment normal.  Nursing note reviewed.    Appointment on 05/19/2019  Component Date Value Ref Range Status  . Ferritin 05/19/2019 72  11 - 307 ng/mL Final   Performed at [facility] [address] [place], [place] [address]  . WBC 05/19/2019 9.5  4.0 - 10.5 K/uL Final  . RBC 05/19/2019 4.60  3.87 - 5.11 MIL/uL Final  . Hemoglobin 05/19/2019 14.8  12.0 - 15.0 g/dL Final  . HCT 05/19/2019 43.0  36.0 - 46.0 % Final  . MCV 05/19/2019 93.5  80.0 - 100.0 fL Final  . MCH 05/19/2019 32.2  26.0 - 34.0 pg Final  . MCHC 05/19/2019 34.4  30.0 - 36.0 g/dL Final  . RDW 05/19/2019 13.2  11.5 - 15.5 % Final  . Platelets 05/19/2019 243  150 - 400 K/uL Final  . nRBC 05/19/2019 0.0  0.0 - 0.2 % Final  . Neutrophils Relative % 05/19/2019 57  % Final  . Neutro Abs 05/19/2019 5.4  1.7 - 7.7 K/uL Final  . Lymphocytes Relative 05/19/2019 34  % Final  . Lymphs Abs 05/19/2019 3.3  0.7 - 4.0 K/uL Final  . Monocytes Relative 05/19/2019 7  % Final  .  Monocytes Absolute 05/19/2019 0.6  0.1 - 1.0 K/uL Final  . Eosinophils Relative 05/19/2019 2  % Final  . Eosinophils Absolute 05/19/2019 0.2  0.0 - 0.5 K/uL Final  .  Basophils Relative 05/19/2019 0  % Final  . Basophils Absolute 05/19/2019 0.0  0.0 - 0.1 K/uL Final  . Immature Granulocytes 05/19/2019 0  % Final  . Abs Immature Granulocytes 05/19/2019 0.03  0.00 - 0.07 K/uL Final   Performed at [facility], [address], [place], [place] [address]    Assessment:  Duanna Runk is a 48 y.o. female with a mild normocytic anemia.  She has iron deficiency.  Hemoglobin was normal until 03/12/2018.  She notes scant vaginal bleeding on DepoProvera.  She denies any hematuria, melena or hematochezia.  Diet appears good.  She has ice pica.  CBC on 03/12/2018 revealed a hematocrit of 32.7, hemoglobin 11.2, and MCV 82.4.  Normal studies included:  Creatinine, LFTs, TSH and free T4.  Ferritin was 11.  Iron saturation was 8% and TIBC 362.  Work-up on 03/24/2018 revealed a hematocrit of 35.7, hemoglobin 11.9, and MCV 84.2.  Folate was 17.2.  B12 was 296 (low normal).  MMA was normal.  Intrinsic factor antibody and antiparietal cell antibody was normal on 06/12/2018.  Retic was 1.3%.  Guaiac cards were negative x 3.  She began oral B12 on 04/13/2018.  B12 was 502 on 06/12/2018 and 327 on 11/21/2018. Began weekly B12 injections x 6 on 11/28/2018 (last 05/19/2019).  Ferritin has been followed: 8 on 04/28/2012, 20 on 09/03/2012, 18 on 09/28/2013, 11 on 03/12/2018, 38 on 06/11/2018, 58 on 07/24/2018, 73 on 11/21/2018, 92 on 02/12/2019, and 72 on 05/19/2019.  Oral iron was discontinued on 02/13/2019.  PSG testing on 08/01/2018 at [facility]. Study not consistent with OSAH syndrome, however there was significant evidence of RLS. Patient with elevated periodic limb movement index of 76.7 movements per hour, which provoked arousals and awakenings. Recommendations included weight  loss and formal treatment of patient's restless leg symptoms.  Symptomatically, she feels good.  She denies any melena or hematochezia.  Plan: 1.   Review labs from 05/19/2019.  2.   Iron deficiency anemia   Hematocrit 43.0.  Hemoglobin 14.8.  MCV 93.5.  Ferritin 72. She has been off oral iron x 3 months. Continue surveillance. 3. B12 deficiency  RTC monthly x 12 for B12 (last 05/19/2019). 4.   RTC in 6 months for labs (CBC with diff, ferritin, folate), and B12 injection. 5.   RTC in 12 months for MD assessment, labs (CBC with diff, ferritin), and B12 injection.  I discussed the assessment and treatment plan with the patient.  The patient was provided an opportunity to ask questions and all were answered.  The patient agreed with the plan and demonstrated an understanding of the instructions.  The patient was advised to call back or seek an in person evaluation if the symptoms worsen or if the condition fails to improve as anticipated.   Nolon Stalls, MD, PhD  05/20/2019, 8:49 AM  I, Cloyde Reams Dorshimer, am acting as Education administrator for [employer]. Mike Gip, MD, PhD.  I,  C. Mike Gip, MD, have reviewed the above documentation for accuracy and completeness, and I agree with the above.

## 2019-05-21 ENCOUNTER — Ambulatory Visit: Payer: Medicare Other

## 2019-05-21 ENCOUNTER — Ambulatory Visit: Payer: Medicare Other | Admitting: Hematology and Oncology

## 2019-05-21 ENCOUNTER — Other Ambulatory Visit: Payer: Medicare Other

## 2019-05-22 ENCOUNTER — Ambulatory Visit: Payer: Medicare Other

## 2019-05-27 ENCOUNTER — Telehealth: Payer: Self-pay | Admitting: Family Medicine

## 2019-05-27 NOTE — Chronic Care Management (AMB) (Signed)
Chronic Care Management   Note  05/27/2019 Name: Amanda Webb MRN: [id number] DOB: 01-21-1971  Amanda Webb is a 48 y.o. year old female who is a primary care patient of Olin Hauser, DO. I reached out to [name] by phone today in response to a referral sent by [name]'s health plan.    Ms. Dial was given information about Chronic Care Management services today including:  1. CCM service includes personalized support from designated clinical staff supervised by her physician, including individualized plan of care and coordination with other care providers 2. 24/7 contact phone numbers for assistance for urgent and routine care needs. 3. Service will only be billed when office clinical staff spend 20 minutes or more in a month to coordinate care. 4. Only one practitioner may furnish and bill the service in a calendar month. 5. The patient may stop CCM services at any time (effective at the end of the month) by phone call to the office staff. 6. The patient will be responsible for cost sharing (co-pay) of up to 20% of the service fee (after annual deductible is met).  Patient did not agree to enrollment in care management services and does not wish to consider at this time.  Follow up plan: The patient has been provided with contact information for the chronic care management team and has been advised to call with any health related questions or concerns.   [place]  [email or web address]   ??[phone]

## 2019-06-16 ENCOUNTER — Other Ambulatory Visit: Payer: Self-pay

## 2019-06-16 ENCOUNTER — Inpatient Hospital Stay: Payer: Medicare Other

## 2019-06-16 DIAGNOSIS — E538 Deficiency of other specified B group vitamins: Secondary | ICD-10-CM | POA: Diagnosis not present

## 2019-06-16 DIAGNOSIS — D509 Iron deficiency anemia, unspecified: Secondary | ICD-10-CM

## 2019-06-16 DIAGNOSIS — G2581 Restless legs syndrome: Secondary | ICD-10-CM | POA: Diagnosis not present

## 2019-06-16 MED ORDER — CYANOCOBALAMIN 1000 MCG/ML IJ SOLN
1000.0000 ug | Freq: Once | INTRAMUSCULAR | Status: AC
  Administered 2019-06-16: 1000 ug via INTRAMUSCULAR

## 2019-06-16 NOTE — Patient Instructions (Signed)

## 2019-06-17 DIAGNOSIS — I10 Essential (primary) hypertension: Secondary | ICD-10-CM | POA: Diagnosis not present

## 2019-06-17 DIAGNOSIS — K625 Hemorrhage of anus and rectum: Secondary | ICD-10-CM | POA: Diagnosis not present

## 2019-06-17 DIAGNOSIS — F17210 Nicotine dependence, cigarettes, uncomplicated: Secondary | ICD-10-CM | POA: Diagnosis not present

## 2019-06-17 DIAGNOSIS — M797 Fibromyalgia: Secondary | ICD-10-CM | POA: Diagnosis not present

## 2019-06-17 DIAGNOSIS — E039 Hypothyroidism, unspecified: Secondary | ICD-10-CM | POA: Diagnosis not present

## 2019-06-17 DIAGNOSIS — Z825 Family history of asthma and other chronic lower respiratory diseases: Secondary | ICD-10-CM | POA: Diagnosis not present

## 2019-06-17 DIAGNOSIS — Z79899 Other long term (current) drug therapy: Secondary | ICD-10-CM | POA: Diagnosis not present

## 2019-06-17 DIAGNOSIS — F419 Anxiety disorder, unspecified: Secondary | ICD-10-CM | POA: Diagnosis not present

## 2019-07-02 ENCOUNTER — Other Ambulatory Visit: Payer: Self-pay | Admitting: Family Medicine

## 2019-07-03 ENCOUNTER — Other Ambulatory Visit: Payer: Self-pay | Admitting: Family Medicine

## 2019-07-03 DIAGNOSIS — R609 Edema, unspecified: Secondary | ICD-10-CM

## 2019-07-03 DIAGNOSIS — I10 Essential (primary) hypertension: Secondary | ICD-10-CM

## 2019-07-07 ENCOUNTER — Ambulatory Visit (INDEPENDENT_AMBULATORY_CARE_PROVIDER_SITE_OTHER): Payer: Medicare Other

## 2019-07-07 DIAGNOSIS — Z0000 Encounter for general adult medical examination without abnormal findings: Secondary | ICD-10-CM | POA: Diagnosis not present

## 2019-07-07 NOTE — Patient Instructions (Signed)
Amanda Webb , Thank you for taking time to come for your Medicare Wellness Visit. I appreciate your ongoing commitment to your health goals. Please review the following plan we discussed and let me know if I can assist you in the future.   Screening recommendations/referrals: Colonoscopy: due at age 48 Mammogram: Please call [phone] to schedule your mammogram.  Bone Density: not indicated  Recommended yearly ophthalmology/optometry visit for glaucoma screening and checkup Recommended yearly dental visit for hygiene and checkup  Vaccinations: Influenza vaccine: up to date Pneumococcal vaccine: up to date Tdap vaccine: up to date Shingles vaccine: not indicated    Advanced directives: please pick up a copy of this information next time your in the office  Conditions/risks identified:  If you wish to quit smoking, help is available. For free tobacco cessation program offerings call the [facility] at (510)348-7698 or Live Well Line at 534-152-7299. You may also visit [email or web address] or email [email or web address] for more information on other programs.   Next appointment: Schedule a follow up with Dr.Karamalegos. follow up in one year for your annual wellness visit.   Preventive Care 40-64 Years, Female Preventive care refers to lifestyle choices and visits with your health care provider that can promote health and wellness. What does preventive care include?  A yearly physical exam. This is also called an annual well check.  Dental exams once or twice a year.  Routine eye exams. Ask your health care provider how often you should have your eyes checked.  Personal lifestyle choices, including:  Daily care of your teeth and gums.  Regular physical activity.  Eating a healthy diet.  Avoiding tobacco and drug use.  Limiting alcohol use.  Practicing safe sex.  Taking low-dose aspirin daily starting at age 8.  Taking vitamin and mineral  supplements as recommended by your health care provider. What happens during an annual well check? The services and screenings done by your health care provider during your annual well check will depend on your age, overall health, lifestyle risk factors, and family history of disease. Counseling  Your health care provider may ask you questions about your:  Alcohol use.  Tobacco use.  Drug use.  Emotional well-being.  Home and relationship well-being.  Sexual activity.  Eating habits.  Work and work Statistician.  Method of birth control.  Menstrual cycle.  Pregnancy history. Screening  You may have the following tests or measurements:  Height, weight, and BMI.  Blood pressure.  Lipid and cholesterol levels. These may be checked every 5 years, or more frequently if you are over 68 years old.  Skin check.  Lung cancer screening. You may have this screening every year starting at age 48 if you have a 30-pack-year history of smoking and currently smoke or have quit within the past 15 years.  Fecal occult blood test (FOBT) of the stool. You may have this test every year starting at age 39.  Flexible sigmoidoscopy or colonoscopy. You may have a sigmoidoscopy every 5 years or a colonoscopy every 10 years starting at age 47.  Hepatitis C blood test.  Hepatitis B blood test.  Sexually transmitted disease (STD) testing.  Diabetes screening. This is done by checking your blood sugar (glucose) after you have not eaten for a while (fasting). You may have this done every 1-3 years.  Mammogram. This may be done every 1-2 years. Talk to your health care provider about when you should start having regular mammograms. This may depend on  whether you have a family history of breast cancer.  BRCA-related cancer screening. This may be done if you have a family history of breast, ovarian, tubal, or peritoneal cancers.  Pelvic exam and Pap test. This may be done every 3 years starting  at age 42. Starting at age 36, this may be done every 5 years if you have a Pap test in combination with an HPV test.  Bone density scan. This is done to screen for osteoporosis. You may have this scan if you are at high risk for osteoporosis. Discuss your test results, treatment options, and if necessary, the need for more tests with your health care provider. Vaccines  Your health care provider may recommend certain vaccines, such as:  Influenza vaccine. This is recommended every year.  Tetanus, diphtheria, and acellular pertussis (Tdap, Td) vaccine. You may need a Td booster every 10 years.  Zoster vaccine. You may need this after age 53.  Pneumococcal 13-valent conjugate (PCV13) vaccine. You may need this if you have certain conditions and were not previously vaccinated.  Pneumococcal polysaccharide (PPSV23) vaccine. You may need one or two doses if you smoke cigarettes or if you have certain conditions. Talk to your health care provider about which screenings and vaccines you need and how often you need them. This information is not intended to replace advice given to you by your health care provider. Make sure you discuss any questions you have with your health care provider. Document Released: 12/30/2015 Document Revised: 08/22/2016 Document Reviewed: 10/04/2015 Elsevier Interactive Patient Education  2017 [place] Prevention in the Home Falls can cause injuries. They can happen to people of all ages. There are many things you can do to make your home safe and to help prevent falls. What can I do on the outside of my home?  Regularly fix the edges of walkways and driveways and fix any cracks.  Remove anything that might make you trip as you walk through a door, such as a raised step or threshold.  Trim any bushes or trees on the path to your home.  Use bright outdoor lighting.  Clear any walking paths of anything that might make someone trip, such as rocks or  tools.  Regularly check to see if handrails are loose or broken. Make sure that both sides of any steps have handrails.  Any raised decks and porches should have guardrails on the edges.  Have any leaves, snow, or ice cleared regularly.  Use sand or salt on walking paths during winter.  Clean up any spills in your garage right away. This includes oil or grease spills. What can I do in the bathroom?  Use night lights.  Install grab bars by the toilet and in the tub and shower. Do not use towel bars as grab bars.  Use non-skid mats or decals in the tub or shower.  If you need to sit down in the shower, use a plastic, non-slip stool.  Keep the floor dry. Clean up any water that spills on the floor as soon as it happens.  Remove soap buildup in the tub or shower regularly.  Attach bath mats securely with double-sided non-slip rug tape.  Do not have throw rugs and other things on the floor that can make you trip. What can I do in the bedroom?  Use night lights.  Make sure that you have a light by your bed that is easy to reach.  Do not use any sheets  or blankets that are too big for your bed. They should not hang down onto the floor.  Have a firm chair that has side arms. You can use this for support while you get dressed.  Do not have throw rugs and other things on the floor that can make you trip. What can I do in the kitchen?  Clean up any spills right away.  Avoid walking on wet floors.  Keep items that you use a lot in easy-to-reach places.  If you need to reach something above you, use a strong step stool that has a grab bar.  Keep electrical cords out of the way.  Do not use floor polish or wax that makes floors slippery. If you must use wax, use non-skid floor wax.  Do not have throw rugs and other things on the floor that can make you trip. What can I do with my stairs?  Do not leave any items on the stairs.  Make sure that there are handrails on both  sides of the stairs and use them. Fix handrails that are broken or loose. Make sure that handrails are as long as the stairways.  Check any carpeting to make sure that it is firmly attached to the stairs. Fix any carpet that is loose or worn.  Avoid having throw rugs at the top or bottom of the stairs. If you do have throw rugs, attach them to the floor with carpet tape.  Make sure that you have a light switch at the top of the stairs and the bottom of the stairs. If you do not have them, ask someone to add them for you. What else can I do to help prevent falls?  Wear shoes that:  Do not have high heels.  Have rubber bottoms.  Are comfortable and fit you well.  Are closed at the toe. Do not wear sandals.  If you use a stepladder:  Make sure that it is fully opened. Do not climb a closed stepladder.  Make sure that both sides of the stepladder are locked into place.  Ask someone to hold it for you, if possible.  Clearly mark and make sure that you can see:  Any grab bars or handrails.  First and last steps.  Where the edge of each step is.  Use tools that help you move around (mobility aids) if they are needed. These include:  Canes.  Walkers.  Scooters.  Crutches.  Turn on the lights when you go into a dark area. Replace any light bulbs as soon as they burn out.  Set up your furniture so you have a clear path. Avoid moving your furniture around.  If any of your floors are uneven, fix them.  If there are any pets around you, be aware of where they are.  Review your medicines with your doctor. Some medicines can make you feel dizzy. This can increase your chance of falling. Ask your doctor what other things that you can do to help prevent falls. This information is not intended to replace advice given to you by your health care provider. Make sure you discuss any questions you have with your health care provider. Document Released: 09/29/2009 Document Revised:  05/10/2016 Document Reviewed: 01/07/2015 Elsevier Interactive Patient Education  2017 [employer].

## 2019-07-07 NOTE — Progress Notes (Signed)
Subjective:   Amanda Webb is a 48 y.o. female who presents for an Initial Medicare Annual Wellness Visit.  This visit is being conducted via phone call  - after an attmept to do on video chat - due to the COVID-19 pandemic. This patient has given me verbal consent via phone to conduct this visit, patient states they are participating from their home address. Some vital signs may be absent or patient reported.   Patient identification: identified by name, DOB, and current address.    Review of Systems      Cardiac Risk Factors include: advanced age (>57men, >62 women);dyslipidemia;hypertension     Objective:    Today's Vitals   07/07/19 1500  PainSc: 7    There is no height or weight on file to calculate BMI.  Advanced Directives 07/07/2019 05/20/2019 02/13/2019 12/01/2018 07/24/2018 06/12/2018 04/08/2018  Does Patient Have a Medical Advance Directive? No No No No No No No  Would patient like information on creating a medical advance directive? - - No - Patient declined - - - -    Current Medications (verified) Outpatient Encounter Medications as of 07/07/2019  Medication Sig  . albuterol (PROVENTIL HFA;VENTOLIN HFA) 108 (90 Base) MCG/ACT inhaler INHALE 1 TO 2 PUFFS BY MOUTH AND INTO LUNGS EVERY 4 TO 6 HOURS AS NEEDED FOR SHORTNESS OF BREATH  . ARIPiprazole (ABILIFY) 2 MG tablet Take 1 tablet (2 mg total) by mouth daily.  Marland Kitchen atenolol (TENORMIN) 50 MG tablet Take 0.5 tablets (25 mg total) by mouth daily.  . Cetirizine HCl (ZYRTEC ALLERGY) 10 MG CAPS Take 1 capsule by mouth daily.   Marland Kitchen DOK 100 MG capsule TAKE 1 CAPSULE BY MOUTH ONCE DAILY (Patient taking differently: Take 100 mg by mouth daily as needed. )  . ferrous sulfate 325 (65 FE) MG EC tablet 1 tablet (325mg ) BID to TID based on tolerance. TAKE WITH SOURCE OF VITAMIN C.  . fluticasone (FLONASE) 50 MCG/ACT nasal spray USE 2 SPRAYS IN EACH NOSTRIL ONCE DAILY  . fluvoxaMINE (LUVOX) 100 MG tablet Take 150 mg by mouth 2 (two)  times daily.  . hydrochlorothiazide (HYDRODIURIL) 12.5 MG tablet TAKE 1 TABLET(12.5 MG) BY MOUTH DAILY  . ketoconazole (NIZORAL) 2 % shampoo APPLY TO THE AFFECTED AREA DAILY AS DIRECTED  . lamoTRIgine (LAMICTAL) 200 MG tablet Take 200 mg by mouth daily.  Marland Kitchen levothyroxine (SYNTHROID, LEVOTHROID) 50 MCG tablet TAKE 1 TABLET BY MOUTH EVERY MORNING  . medroxyPROGESTERone (DEPO-PROVERA) 150 MG/ML injection Inject 1 mL (150 mg total) into the muscle every 3 (three) months.  . pantoprazole (PROTONIX) 20 MG tablet TAKE 1 TABLET(20 MG) BY MOUTH DAILY  . QUEtiapine (SEROQUEL) 25 MG tablet 25 mg 2 (two) times daily.   . SUMAtriptan (IMITREX) 5 MG/ACT nasal spray Place 1 spray (5 mg total) every 2 (two) hours as needed into the nose for migraine. Max 3 doses in 24 hours  . triamcinolone ointment (KENALOG) 0.1 % Apply topically 2 (two) times daily.  . [DISCONTINUED] QUEtiapine (SEROQUEL) 50 MG tablet Take 50 mg by mouth 3 (three) times daily.  Marland Kitchen rOPINIRole (REQUIP) 0.5 MG tablet Take 1 tablet (0.5 mg total) by mouth at bedtime. (Patient not taking: Reported on 07/07/2019)   No facility-administered encounter medications on file as of 07/07/2019.     Allergies (verified) Bee venom, Cat hair extract, Tetracycline, Tetracyclines & related, Lac bovis, Lactose, Milk protein, and Tape   History: Past Medical History:  Diagnosis Date  . Allergy   .  Anemia   . Anxiety   . Arrhythmia   . Arthritis   . Cervical dysplasia   . Cystitis   . Depression   . GERD (gastroesophageal reflux disease)   . Gross hematuria   . Heart murmur   . HLD (hyperlipidemia)   . Hypertension   . Hypothyroid   . Pernicious anemia   . Tobacco abuse    Past Surgical History:  Procedure Laterality Date  . CARPAL TUNNEL RELEASE Bilateral 2011  . FOOT SURGERY Right 2013   Plantar fascia  . TONSILLECTOMY  1979   Family History  Problem Relation Age of Onset  . Depression Mother   . Mental illness Mother   . Alcohol abuse  Mother   . Cancer Maternal Grandmother   . Diabetes Maternal Grandmother   . Ovarian cancer Maternal Grandmother   . Kidney disease Maternal Grandfather   . Diabetes Father   . Mental illness Father   . Depression Father   . Drug abuse Father   . Depression Paternal Grandfather   . Drug abuse Paternal Grandfather   . Bladder Cancer Neg Hx   . Breast cancer Neg Hx    Social History   Socioeconomic History  . Marital status: Divorced    Spouse name: Not on file  . Number of children: Not on file  . Years of education: Some college  . Highest education level: Some college, no degree  Occupational History  . Occupation: disability   Social Needs  . Financial resource strain: Not very hard  . Food insecurity    Worry: Never true    Inability: Never true  . Transportation needs    Medical: No    Non-medical: No  Tobacco Use  . Smoking status: Current Every Day Smoker    Packs/day: 0.50    Years: 30.00    Pack years: 15.00    Types: Cigarettes  . Smokeless tobacco: Never Used  Substance and Sexual Activity  . Alcohol use: No  . Drug use: No  . Sexual activity: Yes    Birth control/protection: Condom, Injection  Lifestyle  . Physical activity    Days per week: 0 days    Minutes per session: 0 min  . Stress: Not at all  Relationships  . Social Herbalist on phone: Three times a week    Gets together: Three times a week    Attends religious service: Never    Active member of club or organization: No    Attends meetings of clubs or organizations: Never    Relationship status: Divorced  Other Topics Concern  . Not on file  Social History Narrative   Lives with mom    Tobacco Counseling Ready to quit: Yes Counseling given: Yes   Clinical Intake:  Pre-visit preparation completed: Yes  Pain : 0-10 Pain Score: 7  Pain Type: Acute pain Pain Location: Foot Pain Orientation: Right Pain Descriptors / Indicators: Aching Pain Onset: More than a month  ago Pain Frequency: Intermittent     Nutritional Risks: None Diabetes: No  How often do you need to have someone help you when you read instructions, pamphlets, or other written materials from your doctor or pharmacy?: 1 - Never  Interpreter Needed?: No  Information entered by :: Liyat Faulkenberry,LPN   Activities of Daily Living In your present state of health, do you have any difficulty performing the following activities: 07/07/2019  Hearing? N  Vision? N  Comment eyeglasses  Difficulty  concentrating or making decisions? N  Walking or climbing stairs? N  Dressing or bathing? N  Doing errands, shopping? N  Preparing Food and eating ? N  Using the Toilet? N  In the past six months, have you accidently leaked urine? Y  Comment depends sometimes for leakage  Do you have problems with loss of bowel control? N  Managing your Medications? N  Managing your Finances? N  Housekeeping or managing your Housekeeping? N  Some recent data might be hidden     Immunizations and Health Maintenance Immunization History  Administered Date(s) Administered  . Influenza, Seasonal, Injecte, Preservative Fre 10/21/2007, 09/01/2011, 10/07/2012  . Influenza,inj,Quad PF,6+ Mos 09/17/2013, 09/26/2016, 10/03/2017, 09/19/2018  . Influenza-Unspecified 08/18/2014  . Pneumococcal Polysaccharide-23 10/03/2017  . Tdap 10/21/2007   There are no preventive care reminders to display for this patient.  Patient Care Team: Olin Hauser, DO as PCP - General (Family Medicine) Minna Merritts, MD as Consulting Physician (Cardiology)  Indicate any recent Medical Services you may have received from other than [facility] providers in the past year (date may be approximate).     Assessment:   This is a routine wellness examination for [facility].  Hearing/Vision screen  Hearing Screening   125Hz  250Hz  500Hz  1000Hz  2000Hz  3000Hz  4000Hz  6000Hz  8000Hz   Right ear:           Left ear:           Vision  Screening Comments: Goes to Dr.Woodard  Dietary issues and exercise activities discussed: Current Exercise Habits: The patient does not participate in regular exercise at present, Exercise limited by: None identified  Goals   None    Depression Screen PHQ 2/9 Scores 07/07/2019 01/01/2019 10/20/2018 09/26/2016  PHQ - 2 Score 2 0 0 0  PHQ- 9 Score 11 3 - -    Fall Risk Fall Risk  07/07/2019 01/01/2019 10/20/2018 09/26/2016  Falls in the past year? 0 0 0 No  Follow up - Falls evaluation completed - -   FALL RISK PREVENTION PERTAINING TO THE HOME:  Any stairs in or around the home? Yes  If so, are there any without handrails? No   Home free of loose throw rugs in walkways, pet beds, electrical cords, etc? Yes  Adequate lighting in your home to reduce risk of falls? Yes   ASSISTIVE DEVICES UTILIZED TO PREVENT FALLS:  Life alert? No  Use of a cane, walker or w/c? No  Grab bars in the bathroom? Yes  Shower chair or bench in shower? No  Elevated toilet seat or a handicapped toilet? No    DME ORDERS:  DME order needed?  No   TIMED UP AND GO:  Unable to perform    Cognitive Function:        Screening Tests Health Maintenance  Topic Date Due  . INFLUENZA VACCINE  07/18/2019  . PAP SMEAR-Modifier  04/02/2020  . TETANUS/TDAP  09/17/2023  . HIV Screening  Completed    Qualifies for Shingles Vaccine? No    Tdap: up to date   Flu Vaccine: up to date   Pneumococcal Vaccine: up to date  Cancer Screenings:  Colorectal Screening:not indcated   Mammogram: ordered by Dr.Defransecso   Bone Density: not indicated   Lung Cancer Screening: (Low Dose CT Chest recommended if Age 51-80 years, 30 pack-year currently smoking OR have quit w/in 15years.) does not qualify.    Additional Screening:  Hepatitis C Screening: does not qualify  Vision Screening: Recommended annual ophthalmology  exams for early detection of glaucoma and other disorders of the eye. Is the patient up  to date with their annual eye exam? yes Who is the provider or what is the name of the office in which the pt attends annual eye exams? Dr.Woodard   Dental Screening: Recommended annual dental exams for proper oral hygiene  Community Resource Referral:  CRR required this visit?  Yes       Plan:  I have personally reviewed and addressed the Medicare Annual Wellness questionnaire and have noted the following in the patient's chart:  A. Medical and social history B. Use of alcohol, tobacco or illicit drugs  C. Current medications and supplements D. Functional ability and status E.  Nutritional status F.  Physical activity G. Advance directives H. List of other physicians I.  Hospitalizations, surgeries, and ER visits in previous 12 months J.  [place] such as hearing and vision if needed, cognitive and depression L. Referrals and appointments   In addition, I have reviewed and discussed with patient certain preventive protocols, quality metrics, and best practice recommendations. A written personalized care plan for preventive services as well as general preventive health recommendations were provided to patient.   Signed,    Bevelyn Ngo, LPN   07/28/7516  Nurse Health Advisor   Nurse Notes: none

## 2019-07-08 NOTE — Progress Notes (Signed)
Date last pap: 04/02/17.. Last Depo-Provera:04/16/19 . Side Effects if any: none. Serum HCG indicated? n/a. Depo-Provera 150 mg IM given by: FH, LPN. Next appointment due: October 8-22, 2020.   Pt needs annual exam before next depo injection.

## 2019-07-09 ENCOUNTER — Other Ambulatory Visit: Payer: Self-pay

## 2019-07-09 ENCOUNTER — Ambulatory Visit (INDEPENDENT_AMBULATORY_CARE_PROVIDER_SITE_OTHER): Payer: Medicare Other | Admitting: Obstetrics and Gynecology

## 2019-07-09 VITALS — BP 98/62 | HR 80 | Ht 67.0 in | Wt 246.5 lb

## 2019-07-09 DIAGNOSIS — M4727 Other spondylosis with radiculopathy, lumbosacral region: Secondary | ICD-10-CM | POA: Diagnosis not present

## 2019-07-09 DIAGNOSIS — M722 Plantar fascial fibromatosis: Secondary | ICD-10-CM | POA: Diagnosis not present

## 2019-07-09 DIAGNOSIS — Z3042 Encounter for surveillance of injectable contraceptive: Secondary | ICD-10-CM | POA: Diagnosis not present

## 2019-07-09 DIAGNOSIS — M5416 Radiculopathy, lumbar region: Secondary | ICD-10-CM | POA: Diagnosis not present

## 2019-07-09 DIAGNOSIS — M5137 Other intervertebral disc degeneration, lumbosacral region: Secondary | ICD-10-CM | POA: Diagnosis not present

## 2019-07-09 MED ORDER — MEDROXYPROGESTERONE ACETATE 150 MG/ML IM SUSP
150.0000 mg | Freq: Once | INTRAMUSCULAR | Status: AC
  Administered 2019-07-09: 150 mg via INTRAMUSCULAR

## 2019-07-09 MED ORDER — MEDROXYPROGESTERONE ACETATE 150 MG/ML IM SUSP: 150.0000 mg | INTRAMUSCULAR | 0 refills | Status: DC

## 2019-07-13 ENCOUNTER — Other Ambulatory Visit: Payer: Self-pay

## 2019-07-14 ENCOUNTER — Inpatient Hospital Stay: Payer: Medicare Other | Attending: Hematology and Oncology

## 2019-07-14 ENCOUNTER — Other Ambulatory Visit: Payer: Self-pay

## 2019-07-14 VITALS — BP 111/72 | HR 69 | Temp 100.0°F | Resp 18

## 2019-07-14 DIAGNOSIS — E538 Deficiency of other specified B group vitamins: Secondary | ICD-10-CM | POA: Diagnosis not present

## 2019-07-14 DIAGNOSIS — D509 Iron deficiency anemia, unspecified: Secondary | ICD-10-CM

## 2019-07-14 MED ORDER — CYANOCOBALAMIN 1000 MCG/ML IJ SOLN
1000.0000 ug | Freq: Once | INTRAMUSCULAR | Status: AC
  Administered 2019-07-14: 1000 ug via INTRAMUSCULAR

## 2019-07-20 DIAGNOSIS — M5416 Radiculopathy, lumbar region: Secondary | ICD-10-CM

## 2019-07-21 NOTE — Addendum Note (Signed)
Addended by: Olin Hauser on: 07/21/2019 05:54 PM   Modules accepted: Orders

## 2019-07-24 DIAGNOSIS — F4310 Post-traumatic stress disorder, unspecified: Secondary | ICD-10-CM | POA: Diagnosis not present

## 2019-07-30 DIAGNOSIS — G8929 Other chronic pain: Secondary | ICD-10-CM | POA: Diagnosis not present

## 2019-07-30 DIAGNOSIS — M545 Low back pain: Secondary | ICD-10-CM | POA: Diagnosis not present

## 2019-07-30 DIAGNOSIS — M5416 Radiculopathy, lumbar region: Secondary | ICD-10-CM | POA: Diagnosis not present

## 2019-07-30 DIAGNOSIS — M5412 Radiculopathy, cervical region: Secondary | ICD-10-CM | POA: Diagnosis not present

## 2019-07-31 ENCOUNTER — Other Ambulatory Visit: Payer: Self-pay | Admitting: Student

## 2019-07-31 ENCOUNTER — Other Ambulatory Visit (HOSPITAL_COMMUNITY): Payer: Self-pay | Admitting: Student

## 2019-07-31 DIAGNOSIS — M545 Low back pain, unspecified: Secondary | ICD-10-CM

## 2019-07-31 DIAGNOSIS — G8929 Other chronic pain: Secondary | ICD-10-CM

## 2019-08-04 DIAGNOSIS — M542 Cervicalgia: Secondary | ICD-10-CM | POA: Diagnosis not present

## 2019-08-04 DIAGNOSIS — M545 Low back pain: Secondary | ICD-10-CM | POA: Diagnosis not present

## 2019-08-07 DIAGNOSIS — M545 Low back pain: Secondary | ICD-10-CM | POA: Diagnosis not present

## 2019-08-07 DIAGNOSIS — M542 Cervicalgia: Secondary | ICD-10-CM | POA: Diagnosis not present

## 2019-08-09 ENCOUNTER — Ambulatory Visit
Admission: RE | Admit: 2019-08-09 | Discharge: 2019-08-09 | Disposition: A | Discharge status: Home / Self Care | Payer: Medicare Other | Source: Ambulatory Visit | Attending: Student | Admitting: Student

## 2019-08-09 ENCOUNTER — Other Ambulatory Visit: Payer: Self-pay

## 2019-08-09 DIAGNOSIS — G8929 Other chronic pain: Secondary | ICD-10-CM | POA: Diagnosis not present

## 2019-08-09 DIAGNOSIS — M545 Low back pain, unspecified: Secondary | ICD-10-CM

## 2019-08-10 ENCOUNTER — Other Ambulatory Visit: Payer: Self-pay

## 2019-08-11 ENCOUNTER — Inpatient Hospital Stay: Payer: Medicare Other | Attending: Hematology and Oncology

## 2019-08-11 ENCOUNTER — Other Ambulatory Visit: Payer: Self-pay | Admitting: Family Medicine

## 2019-08-11 DIAGNOSIS — E538 Deficiency of other specified B group vitamins: Secondary | ICD-10-CM | POA: Diagnosis not present

## 2019-08-11 DIAGNOSIS — M542 Cervicalgia: Secondary | ICD-10-CM | POA: Diagnosis not present

## 2019-08-11 DIAGNOSIS — D509 Iron deficiency anemia, unspecified: Secondary | ICD-10-CM

## 2019-08-11 DIAGNOSIS — M545 Low back pain: Secondary | ICD-10-CM | POA: Diagnosis not present

## 2019-08-11 DIAGNOSIS — J309 Allergic rhinitis, unspecified: Secondary | ICD-10-CM

## 2019-08-11 MED ORDER — CYANOCOBALAMIN 1000 MCG/ML IJ SOLN
1000.0000 ug | Freq: Once | INTRAMUSCULAR | Status: AC
  Administered 2019-08-11: 1000 ug via INTRAMUSCULAR

## 2019-08-11 NOTE — Patient Instructions (Signed)

## 2019-08-12 DIAGNOSIS — M79601 Pain in right arm: Secondary | ICD-10-CM | POA: Diagnosis not present

## 2019-08-14 DIAGNOSIS — M542 Cervicalgia: Secondary | ICD-10-CM | POA: Diagnosis not present

## 2019-08-14 DIAGNOSIS — M545 Low back pain: Secondary | ICD-10-CM | POA: Diagnosis not present

## 2019-08-15 ENCOUNTER — Other Ambulatory Visit: Payer: Self-pay | Admitting: Family Medicine

## 2019-08-17 ENCOUNTER — Encounter: Payer: Self-pay | Admitting: Nurse Practitioner

## 2019-08-17 ENCOUNTER — Ambulatory Visit (INDEPENDENT_AMBULATORY_CARE_PROVIDER_SITE_OTHER): Payer: Medicare Other | Admitting: Nurse Practitioner

## 2019-08-17 ENCOUNTER — Other Ambulatory Visit: Payer: Self-pay

## 2019-08-17 DIAGNOSIS — J0140 Acute pansinusitis, unspecified: Secondary | ICD-10-CM

## 2019-08-17 MED ORDER — AMOXICILLIN-POT CLAVULANATE 875-125 MG PO TABS: 1.0000 | ORAL_TABLET | Freq: Two times a day (BID) | ORAL | 0 refills | Status: AC

## 2019-08-17 NOTE — Progress Notes (Signed)
Telemedicine Encounter: Disclosed to patient at start of encounter that we will provide appropriate telemedicine services.  Patient consents to be treated via phone prior to discussion. - Patient is at her home and is accessed via telephone. - Services are provided by Cassell Smiles from [facility]   Subjective:    Patient ID: Amanda Webb, female    DOB: December 23, 1970, 48 y.o.   MRN: [id number]  Amanda Webb is a 48 y.o. female presenting on 08/17/2019 for Sinus Problem (frontial lobe  sinus pressure, headaches and greenish mucus nasal drainage x 2 weeks)  HPI Sinus Congestion Patient has had 2 weeks of increased nasal congestion, sinus pressure. New bloody drainage with streaks of blood with some possible clots in mucous in last 7 days.  Patient has perennial allergies and remains on Zyrtec and Flonase.  Patient has started having pressure headaches in forehead/behind eyes over the last 4 days. - Endorses mild ear pressure bilaterally - Denies fever, cough, shortness of breath, loss of taste or smell, tooth/jaw pain.  - Patient notes no sick contacts.   Social History   Tobacco Use  . Smoking status: Current Every Day Smoker    Packs/day: 0.50    Years: 30.00    Pack years: 15.00    Types: Cigarettes  . Smokeless tobacco: Never Used  Substance Use Topics  . Alcohol use: No  . Drug use: No    Review of Systems Per HPI unless specifically indicated above     Objective:    There were no vitals taken for this visit.  Wt Readings from Last 3 Encounters:  07/09/19 246 lb 8 oz (111.8 kg)  04/16/19 252 lb 11.2 oz (114.6 kg)  02/13/19 261 lb 3.9 oz (118.5 kg)    Physical Exam Patient remotely monitored.  Verbal communication appropriate.  Cognition normal.   Results for orders placed or performed in visit on 05/19/19  Ferritin  Result Value Ref Range   Ferritin 72 11 - 307 ng/mL  CBC with Differential/Platelet  Result Value Ref Range   WBC  9.5 4.0 - 10.5 K/uL   RBC 4.60 3.87 - 5.11 MIL/uL   Hemoglobin 14.8 12.0 - 15.0 g/dL   HCT 43.0 36.0 - 46.0 %   MCV 93.5 80.0 - 100.0 fL   MCH 32.2 26.0 - 34.0 pg   MCHC 34.4 30.0 - 36.0 g/dL   RDW 13.2 11.5 - 15.5 %   Platelets 243 150 - 400 K/uL   nRBC 0.0 0.0 - 0.2 %   Neutrophils Relative % 57 %   Neutro Abs 5.4 1.7 - 7.7 K/uL   Lymphocytes Relative 34 %   Lymphs Abs 3.3 0.7 - 4.0 K/uL   Monocytes Relative 7 %   Monocytes Absolute 0.6 0.1 - 1.0 K/uL   Eosinophils Relative 2 %   Eosinophils Absolute 0.2 0.0 - 0.5 K/uL   Basophils Relative 0 %   Basophils Absolute 0.0 0.0 - 0.1 K/uL   Immature Granulocytes 0 %   Abs Immature Granulocytes 0.03 0.00 - 0.07 K/uL      Assessment & Plan:   Problem List Items Addressed This Visit    None    Visit Diagnoses    Acute non-recurrent pansinusitis    -  Primary    Consistent with perennial allergic rhinitis and secondary sinusitis with symptoms worsening over the past 7 days and initial symptoms of nasal congestion and sinus pressure over 2 weeks ago.   Plan:  1.START taking amoxicillin-clavulanate 875-125 mg tablets every 12 hours for 10 days.  Discussed completing antibiotic. - While on antibiotic, take a probiotic OTC or from food. - Continue anti-histamine cetirizine10mg  daily. - Can use Flonase 2 sprays each nostril daily for up to 4-6 weeks if no epistaxis.  May take a break from [facility] over next 7-14 days after resolved infection to reduce epistaxis (mild) - Start Mucinex-DM OTC for  7-10 days prn congestion 2. Supportive care with nasal saline, warm herbal tea with honey, 3. Improve hydration 4. Tylenol / Motrin PRN fevers  5. Return criteria given    Meds ordered this encounter  Medications  . amoxicillin-clavulanate (AUGMENTIN) 875-125 MG tablet    Sig: Take 1 tablet by mouth 2 (two) times daily for 10 days.    Dispense:  20 tablet    Refill:  0    Order Specific Question:   Supervising Provider    Answer:    Olin Hauser [[id number]]    - Time spent in direct consultation with patient via telemedicine about above concerns: 8 minutes  Follow up plan: Follow-up 1-2 weeks if not improved on antibiotics.  Cassell Smiles, DNP, AGPCNP-BC Adult Gerontology Primary Care Nurse Practitioner [facility] 08/17/2019, 11:20 AM

## 2019-08-18 DIAGNOSIS — M545 Low back pain: Secondary | ICD-10-CM | POA: Diagnosis not present

## 2019-08-18 DIAGNOSIS — M542 Cervicalgia: Secondary | ICD-10-CM | POA: Diagnosis not present

## 2019-08-19 ENCOUNTER — Other Ambulatory Visit: Payer: Self-pay | Admitting: Student

## 2019-08-19 ENCOUNTER — Other Ambulatory Visit (HOSPITAL_COMMUNITY): Payer: Self-pay | Admitting: Student

## 2019-08-19 DIAGNOSIS — M5412 Radiculopathy, cervical region: Secondary | ICD-10-CM

## 2019-08-21 DIAGNOSIS — M542 Cervicalgia: Secondary | ICD-10-CM | POA: Diagnosis not present

## 2019-08-21 DIAGNOSIS — M545 Low back pain: Secondary | ICD-10-CM | POA: Diagnosis not present

## 2019-08-25 DIAGNOSIS — M545 Low back pain: Secondary | ICD-10-CM | POA: Diagnosis not present

## 2019-08-25 DIAGNOSIS — M542 Cervicalgia: Secondary | ICD-10-CM | POA: Diagnosis not present

## 2019-08-28 ENCOUNTER — Ambulatory Visit
Admission: RE | Admit: 2019-08-28 | Discharge: 2019-08-28 | Disposition: A | Discharge status: Home / Self Care | Payer: Medicare Other | Source: Ambulatory Visit | Attending: Student | Admitting: Student

## 2019-08-28 ENCOUNTER — Other Ambulatory Visit: Payer: Self-pay

## 2019-08-28 DIAGNOSIS — M5023 Other cervical disc displacement, cervicothoracic region: Secondary | ICD-10-CM | POA: Diagnosis not present

## 2019-08-28 DIAGNOSIS — M5412 Radiculopathy, cervical region: Secondary | ICD-10-CM

## 2019-08-28 DIAGNOSIS — M4802 Spinal stenosis, cervical region: Secondary | ICD-10-CM | POA: Diagnosis not present

## 2019-08-28 DIAGNOSIS — M542 Cervicalgia: Secondary | ICD-10-CM | POA: Diagnosis not present

## 2019-08-28 DIAGNOSIS — M545 Low back pain: Secondary | ICD-10-CM | POA: Diagnosis not present

## 2019-09-01 DIAGNOSIS — M545 Low back pain: Secondary | ICD-10-CM | POA: Diagnosis not present

## 2019-09-01 DIAGNOSIS — M542 Cervicalgia: Secondary | ICD-10-CM | POA: Diagnosis not present

## 2019-09-07 ENCOUNTER — Other Ambulatory Visit: Payer: Self-pay

## 2019-09-08 ENCOUNTER — Inpatient Hospital Stay: Payer: Medicare Other

## 2019-09-10 ENCOUNTER — Ambulatory Visit (INDEPENDENT_AMBULATORY_CARE_PROVIDER_SITE_OTHER): Payer: Medicare Other

## 2019-09-10 ENCOUNTER — Other Ambulatory Visit: Payer: Self-pay

## 2019-09-10 DIAGNOSIS — Z23 Encounter for immunization: Secondary | ICD-10-CM

## 2019-09-10 DIAGNOSIS — M5412 Radiculopathy, cervical region: Secondary | ICD-10-CM | POA: Diagnosis not present

## 2019-09-10 DIAGNOSIS — M5416 Radiculopathy, lumbar region: Secondary | ICD-10-CM | POA: Diagnosis not present

## 2019-09-11 ENCOUNTER — Inpatient Hospital Stay: Payer: Medicare Other | Attending: Hematology and Oncology

## 2019-09-11 VITALS — Temp 98.5°F

## 2019-09-11 DIAGNOSIS — E538 Deficiency of other specified B group vitamins: Secondary | ICD-10-CM | POA: Insufficient documentation

## 2019-09-11 DIAGNOSIS — Z23 Encounter for immunization: Secondary | ICD-10-CM

## 2019-09-11 DIAGNOSIS — D509 Iron deficiency anemia, unspecified: Secondary | ICD-10-CM

## 2019-09-11 MED ORDER — CYANOCOBALAMIN 1000 MCG/ML IJ SOLN
1000.0000 ug | Freq: Once | INTRAMUSCULAR | Status: AC
  Administered 2019-09-11: 14:00:00 1000 ug via INTRAMUSCULAR

## 2019-09-13 NOTE — Progress Notes (Signed)
Flu shot given

## 2019-09-21 ENCOUNTER — Other Ambulatory Visit: Payer: Self-pay

## 2019-09-24 ENCOUNTER — Ambulatory Visit (INDEPENDENT_AMBULATORY_CARE_PROVIDER_SITE_OTHER): Payer: Medicare Other | Admitting: Obstetrics and Gynecology

## 2019-09-24 ENCOUNTER — Other Ambulatory Visit: Payer: Self-pay

## 2019-09-24 ENCOUNTER — Other Ambulatory Visit (HOSPITAL_COMMUNITY)
Admission: RE | Admit: 2019-09-24 | Discharge: 2019-09-24 | Disposition: A | Discharge status: Home / Self Care | Payer: Medicare Other | Source: Ambulatory Visit | Attending: Obstetrics and Gynecology | Admitting: Obstetrics and Gynecology

## 2019-09-24 ENCOUNTER — Encounter: Payer: Self-pay | Admitting: Obstetrics and Gynecology

## 2019-09-24 VITALS — BP 121/77 | HR 71 | Ht 67.0 in | Wt 256.4 lb

## 2019-09-24 DIAGNOSIS — Z01419 Encounter for gynecological examination (general) (routine) without abnormal findings: Secondary | ICD-10-CM | POA: Diagnosis not present

## 2019-09-24 DIAGNOSIS — Z202 Contact with and (suspected) exposure to infections with a predominantly sexual mode of transmission: Secondary | ICD-10-CM | POA: Diagnosis not present

## 2019-09-24 DIAGNOSIS — Z6841 Body Mass Index (BMI) 40.0 and over, adult: Secondary | ICD-10-CM

## 2019-09-24 DIAGNOSIS — Z13220 Encounter for screening for lipoid disorders: Secondary | ICD-10-CM

## 2019-09-24 MED ORDER — MEDROXYPROGESTERONE ACETATE 150 MG/ML IM SUSP: 150.0000 mg | INTRAMUSCULAR | 0 refills | Status: DC

## 2019-09-24 NOTE — Progress Notes (Signed)
HPI:      Amanda Webb is a 48 y.o. [id number] who LMP was No LMP recorded. Patient has had an injection.  Subjective:   She presents today for her annual examination.  She has no specific complaints.  She was requesting testing for STDs.  This seems to be a regular practice of hers at her annual examination.  She states that she has had "possible exposure".  Again-she is asymptomatic.  She is using Depo-Provera for birth control.  She would like to continue    Hx: The following portions of the patient's history were reviewed and updated as appropriate:             She  has a past medical history of Allergy, Anemia, Anxiety, Arrhythmia, Arthritis, Cervical dysplasia, Cystitis, Depression, GERD (gastroesophageal reflux disease), Gross hematuria, Heart murmur, HLD (hyperlipidemia), Hypertension, Hypothyroid, Pernicious anemia, and Tobacco abuse. She does not have any pertinent problems on file. She  has a past surgical history that includes Foot surgery (Right, 2013); Carpal tunnel release (Bilateral, 2011); and Tonsillectomy (1979). Her family history includes Alcohol abuse in her mother; Cancer in her maternal grandmother; Depression in her father, mother, and paternal grandfather; Diabetes in her father and maternal grandmother; Drug abuse in her father and paternal grandfather; Kidney disease in her maternal grandfather; Mental illness in her father and mother; Ovarian cancer in her maternal grandmother. She  reports that she has been smoking cigarettes. She has a 15.00 pack-year smoking history. She has never used smokeless tobacco. She reports that she does not drink alcohol or use drugs. She has a current medication list which includes the following prescription(s): albuterol, aripiprazole, atenolol, cetirizine hcl, dok, ferrous sulfate, fluticasone, fluvoxamine, ketoconazole, lamotrigine, levothyroxine, medroxyprogesterone, pantoprazole, quetiapine, sumatriptan, triamcinolone ointment,  hydrochlorothiazide, medroxyprogesterone, and ropinirole. She is allergic to bee venom; cat hair extract; tetracycline; tetracyclines & related; lac bovis; lactose; milk protein; and tape.       Review of Systems:  Review of Systems  Constitutional: Denied constitutional symptoms, night sweats, recent illness, fatigue, fever, insomnia and weight loss.  Eyes: Denied eye symptoms, eye pain, photophobia, vision change and visual disturbance.  Ears/Nose/Throat/Neck: Denied ear, nose, throat or neck symptoms, hearing loss, nasal discharge, sinus congestion and sore throat.  Cardiovascular: Denied cardiovascular symptoms, arrhythmia, chest pain/pressure, edema, exercise intolerance, orthopnea and palpitations.  Respiratory: Denied pulmonary symptoms, asthma, pleuritic pain, productive sputum, cough, dyspnea and wheezing.  Gastrointestinal: Denied, gastro-esophageal reflux, melena, nausea and vomiting.  Genitourinary: Denied genitourinary symptoms including symptomatic vaginal discharge, pelvic relaxation issues, and urinary complaints.  Musculoskeletal: Denied musculoskeletal symptoms, stiffness, swelling, muscle weakness and myalgia.  Dermatologic: Denied dermatology symptoms, rash and scar.  Neurologic: Denied neurology symptoms, dizziness, headache, neck pain and syncope.  Psychiatric: Denied psychiatric symptoms, anxiety and depression.  Endocrine: Denied endocrine symptoms including hot flashes and night sweats.   Meds:   Current Outpatient Medications on File Prior to Visit  Medication Sig Dispense Refill  . albuterol (PROVENTIL HFA;VENTOLIN HFA) 108 (90 Base) MCG/ACT inhaler INHALE 1 TO 2 PUFFS BY MOUTH AND INTO LUNGS EVERY 4 TO 6 HOURS AS NEEDED FOR SHORTNESS OF BREATH 18 g 2  . ARIPiprazole (ABILIFY) 2 MG tablet Take 1 tablet (2 mg total) by mouth daily.    Marland Kitchen atenolol (TENORMIN) 50 MG tablet Take 0.5 tablets (25 mg total) by mouth daily. 30 tablet 11  . Cetirizine HCl (ZYRTEC ALLERGY) 10  MG CAPS Take 1 capsule by mouth daily.     Marland Kitchen [place]  100 MG capsule TAKE 1 CAPSULE BY MOUTH ONCE DAILY (Patient taking differently: Take 100 mg by mouth daily as needed. ) 30 capsule 0  . ferrous sulfate 325 (65 FE) MG EC tablet 1 tablet (325mg ) BID to TID based on tolerance. TAKE WITH SOURCE OF VITAMIN C. 90 tablet 2  . fluticasone (FLONASE) 50 MCG/ACT nasal spray SHAKE LIQUID AND USE 2 SPRAYS IN EACH NOSTRIL EVERY DAY 48 g 1  . fluvoxaMINE (LUVOX) 100 MG tablet Take 150 mg by mouth 2 (two) times daily.    Marland Kitchen ketoconazole (NIZORAL) 2 % shampoo APPLY EXTERNALLY TO THE AFFECTED AREA DAILY AS DIRECTED 120 mL 0  . lamoTRIgine (LAMICTAL) 200 MG tablet Take 200 mg by mouth daily.    Marland Kitchen levothyroxine (SYNTHROID, LEVOTHROID) 50 MCG tablet TAKE 1 TABLET BY MOUTH EVERY MORNING 90 tablet 3  . medroxyPROGESTERone (DEPO-PROVERA) 150 MG/ML injection Inject 1 mL (150 mg total) into the muscle every 3 (three) months. 1 mL 1  . pantoprazole (PROTONIX) 20 MG tablet TAKE 1 TABLET(20 MG) BY MOUTH DAILY 30 tablet 5  . QUEtiapine (SEROQUEL) 25 MG tablet 25 mg 2 (two) times daily.     . SUMAtriptan (IMITREX) 5 MG/ACT nasal spray Place 1 spray (5 mg total) every 2 (two) hours as needed into the nose for migraine. Max 3 doses in 24 hours 1 Inhaler 0  . triamcinolone ointment (KENALOG) 0.1 % Apply topically 2 (two) times daily. 30 g 0  . hydrochlorothiazide (HYDRODIURIL) 12.5 MG tablet TAKE 1 TABLET(12.5 MG) BY MOUTH DAILY (Patient not taking: Reported on 09/24/2019) 90 tablet 1  . medroxyPROGESTERone (DEPO-PROVERA) 150 MG/ML injection Inject 1 mL (150 mg total) into the muscle every 3 (three) months. 1 mL 0  . rOPINIRole (REQUIP) 0.5 MG tablet Take 1 tablet (0.5 mg total) by mouth at bedtime. (Patient not taking: Reported on 09/24/2019) 30 tablet 0   No current facility-administered medications on file prior to visit.     Objective:     Vitals:   09/24/19 0805  BP: 121/77  Pulse: 71              Physical  examination General NAD, Conversant  HEENT Atraumatic; Op clear with mmm.  Normo-cephalic. Pupils reactive. Anicteric sclerae  Thyroid/Neck Smooth without nodularity or enlargement. Normal ROM.  Neck Supple.  Skin No rashes, lesions or ulceration. Normal palpated skin turgor. No nodularity.  Breasts: No masses or discharge.  Symmetric.  No axillary adenopathy.  Lungs: Clear to auscultation.No rales or wheezes. Normal Respiratory effort, no retractions.  Heart: NSR.  No murmurs or rubs appreciated. No periferal edema  Abdomen: Soft.  Non-tender.  No masses.  No HSM. No hernia  Extremities: Moves all appropriately.  Normal ROM for age. No lymphadenopathy.  Neuro: Oriented to PPT.  Normal mood. Normal affect.     Pelvic:   Vulva: Normal appearance.  No lesions.  Vagina: No lesions or abnormalities noted.  Support: Normal pelvic support.  Urethra No masses tenderness or scarring.  Meatus Normal size without lesions or prolapse.  Cervix: Normal appearance.  No lesions.  Anus: Normal exam.  No lesions.  Perineum: Normal exam.  No lesions.        Bimanual   Uterus: Normal size.  Non-tender.  Mobile.  AV.  Adnexae: No masses.  Non-tender to palpation.  Cul-de-sac: Negative for abnormality.   Exam limited by patient body habitus   Assessment:    [id number] Patient Active Problem List   Diagnosis Date Noted  .  Female cystocele 10/21/2018  . Urinary incontinence, mixed 10/21/2018  . B12 deficiency 04/08/2018  . Iron deficiency anemia 03/14/2018  . GAD (generalized anxiety disorder) 03/10/2018  . Acute right-sided low back pain 10/28/2017  . Tobacco abuse 05/09/2017  . Multiple thyroid nodules 12/28/2016  . Simple chronic bronchitis ([place]) 12/13/2016  . BMI 40.0-44.9, adult ([place]) 09/26/2016  . HLD (hyperlipidemia) 09/26/2016  . Vitamin D deficiency 09/26/2016  . Pre-diabetes 06/17/2015  . Candidal intertrigo 02/21/2015  . Benign cyst of right kidney 01/17/2015  . Whiplash injury  09/03/2014  . Heart palpitations 04/02/2014  . Allergic rhinitis 12/31/2013  . Spondylosis of lumbar region without myelopathy or radiculopathy 12/28/2013  . Chronic low back pain 09/28/2013  . Other fatigue 09/28/2013  . Hypothyroidism 09/28/2013  . Alteration of body temperature 09/28/2013  . Plantar fasciitis, bilateral 07/10/2013  . Tarsal tunnel syndrome of right side 07/10/2013  . Plantar fascial fibromatosis 07/10/2013  . Encounter for surveillance of injectable contraceptive 05/13/2013  . Encounter for Depo-Provera contraception 05/13/2013  . Diuresis excessive 03/24/2013  . Abnormal uterine and vaginal bleeding, unspecified 03/24/2013  . Polyuria 03/24/2013  . Arthritis, multiple joint involvement 03/17/2013  . Fibroblastic disorder 07/28/2012  . Borderline personality disorder ([place]) 12/17/2010  . Essential hypertension 02/18/2010  . Carpal tunnel syndrome on both sides 12/17/2006  . Gall bladder stones 12/17/1998  . Major depressive disorder, single episode 02/21/1998  . Disk prolapse 04/17/1995  . Depression with anxiety 03/18/1995  . OCD (obsessive compulsive disorder) 03/18/1995  . Severe anxiety with panic 03/18/1995     1. Well woman exam with routine gynecological exam   2. Potential exposure to STD   3. Class 3 severe obesity due to excess calories without serious comorbidity with body mass index (BMI) of 40.0 to 44.9 in adult [facility])        Plan:            1.  Basic Screening Recommendations The basic screening recommendations for asymptomatic women were discussed with the patient during her visit.  The age-appropriate recommendations were discussed with her and the rational for the tests reviewed.  When I am informed by the patient that another primary care physician has previously obtained the age-appropriate tests and they are up-to-date, only outstanding tests are ordered and referrals given as necessary.  Abnormal results of tests will be discussed with  her when all of her results are completed. Mammogram ordered-labs ordered and drawn. 2.  Pap smear next year 3.  Discussed multiple STDs with patient and STD testing.  She states "I already know I have herpes so do not test for that and I do not need to be tested for hepatitis".  We have thus tested for GC/CT, RPR, HIV.  Orders No orders of the defined types were placed in this encounter.   No orders of the defined types were placed in this encounter.       F/U  Return in about 1 year (around 09/23/2020) for Annual Physical.  Finis Bud, M.D. 09/24/2019 8:39 AM

## 2019-09-24 NOTE — Progress Notes (Signed)
Patient comes in today for annual exam. She has no concerns today.  

## 2019-09-25 LAB — LIPID PANEL
Chol/HDL Ratio: 4.2 ratio (ref 0.0–4.4)
Cholesterol, Total: 193 mg/dL (ref 100–199)
HDL: 46 mg/dL (ref >39)
LDL Chol Calc (NIH): 116 mg/dL — ABNORMAL HIGH (ref 0–99)
Triglycerides: 176 mg/dL — ABNORMAL HIGH (ref 0–149)
VLDL Cholesterol Cal: 31 mg/dL (ref 5–40)

## 2019-09-25 LAB — HEMOGLOBIN A1C
Est. average glucose Bld gHb Est-mCnc: 114 mg/dL
Hgb A1c MFr Bld: 5.6 % (ref 4.8–5.6)

## 2019-09-25 LAB — RPR: RPR Ser Ql: NONREACTIVE

## 2019-09-25 LAB — TSH: TSH: 3.19 u[IU]/mL (ref 0.450–4.500)

## 2019-09-25 LAB — HIV ANTIBODY (ROUTINE TESTING W REFLEX): HIV Screen 4th Generation wRfx: NONREACTIVE

## 2019-10-02 LAB — CERVICOVAGINAL ANCILLARY ONLY
Chlamydia: NEGATIVE
Neisseria Gonorrhea: NEGATIVE
Trichomonas: NEGATIVE

## 2019-10-06 ENCOUNTER — Inpatient Hospital Stay: Payer: Medicare Other

## 2019-10-08 ENCOUNTER — Other Ambulatory Visit: Payer: Self-pay

## 2019-10-09 ENCOUNTER — Inpatient Hospital Stay: Payer: Medicare Other | Attending: Hematology and Oncology

## 2019-10-09 VITALS — Temp 99.0°F

## 2019-10-09 DIAGNOSIS — D509 Iron deficiency anemia, unspecified: Secondary | ICD-10-CM

## 2019-10-09 DIAGNOSIS — E538 Deficiency of other specified B group vitamins: Secondary | ICD-10-CM | POA: Insufficient documentation

## 2019-10-09 MED ORDER — CYANOCOBALAMIN 1000 MCG/ML IJ SOLN
1000.0000 ug | Freq: Once | INTRAMUSCULAR | Status: AC
  Administered 2019-10-09: 1000 ug via INTRAMUSCULAR

## 2019-10-09 NOTE — Patient Instructions (Signed)

## 2019-10-13 DIAGNOSIS — F4310 Post-traumatic stress disorder, unspecified: Secondary | ICD-10-CM | POA: Diagnosis not present

## 2019-10-23 DIAGNOSIS — F4310 Post-traumatic stress disorder, unspecified: Secondary | ICD-10-CM | POA: Diagnosis not present

## 2019-10-25 ENCOUNTER — Other Ambulatory Visit: Payer: Self-pay | Admitting: Family Medicine

## 2019-10-25 DIAGNOSIS — K219 Gastro-esophageal reflux disease without esophagitis: Secondary | ICD-10-CM

## 2019-10-27 ENCOUNTER — Other Ambulatory Visit: Payer: Self-pay

## 2019-10-27 DIAGNOSIS — L219 Seborrheic dermatitis, unspecified: Secondary | ICD-10-CM

## 2019-10-27 MED ORDER — KETOCONAZOLE 2 % EX SHAM: MEDICATED_SHAMPOO | CUTANEOUS | 1 refills | Status: DC

## 2019-11-02 ENCOUNTER — Other Ambulatory Visit: Payer: Self-pay

## 2019-11-03 ENCOUNTER — Inpatient Hospital Stay: Payer: Medicare Other

## 2019-11-06 ENCOUNTER — Other Ambulatory Visit: Payer: Self-pay

## 2019-11-09 ENCOUNTER — Other Ambulatory Visit: Payer: Self-pay

## 2019-11-09 ENCOUNTER — Inpatient Hospital Stay: Payer: Medicare Other | Attending: Hematology and Oncology

## 2019-11-09 VITALS — BP 110/74 | HR 75 | Temp 99.3°F | Resp 20

## 2019-11-09 DIAGNOSIS — E538 Deficiency of other specified B group vitamins: Secondary | ICD-10-CM | POA: Diagnosis not present

## 2019-11-09 DIAGNOSIS — L700 Acne vulgaris: Secondary | ICD-10-CM

## 2019-11-09 DIAGNOSIS — D509 Iron deficiency anemia, unspecified: Secondary | ICD-10-CM

## 2019-11-09 MED ORDER — TRETINOIN 0.025 % EX CREA: TOPICAL_CREAM | Freq: Every evening | CUTANEOUS | 0 refills | Status: DC | PRN

## 2019-11-09 MED ORDER — CYANOCOBALAMIN 1000 MCG/ML IJ SOLN
1000.0000 ug | Freq: Once | INTRAMUSCULAR | Status: AC
  Administered 2019-11-09: 1000 ug via INTRAMUSCULAR

## 2019-11-17 ENCOUNTER — Other Ambulatory Visit: Payer: Self-pay | Admitting: Family Medicine

## 2019-11-17 DIAGNOSIS — E039 Hypothyroidism, unspecified: Secondary | ICD-10-CM

## 2019-11-30 ENCOUNTER — Other Ambulatory Visit: Payer: Self-pay

## 2019-12-01 ENCOUNTER — Inpatient Hospital Stay: Payer: Medicare Other | Attending: Oncology

## 2019-12-01 VITALS — BP 112/70 | HR 82 | Temp 98.8°F | Resp 18

## 2019-12-01 DIAGNOSIS — E538 Deficiency of other specified B group vitamins: Secondary | ICD-10-CM | POA: Diagnosis not present

## 2019-12-01 DIAGNOSIS — D509 Iron deficiency anemia, unspecified: Secondary | ICD-10-CM

## 2019-12-01 MED ORDER — CYANOCOBALAMIN 1000 MCG/ML IJ SOLN
1000.0000 ug | Freq: Once | INTRAMUSCULAR | Status: AC
  Administered 2019-12-01: 1000 ug via INTRAMUSCULAR

## 2019-12-07 ENCOUNTER — Telehealth: Payer: Self-pay | Admitting: Surgical

## 2019-12-07 ENCOUNTER — Ambulatory Visit (INDEPENDENT_AMBULATORY_CARE_PROVIDER_SITE_OTHER): Payer: Medicare Other | Admitting: Obstetrics and Gynecology

## 2019-12-07 ENCOUNTER — Other Ambulatory Visit: Payer: Self-pay

## 2019-12-07 VITALS — BP 117/68 | HR 73 | Ht 67.0 in | Wt 259.7 lb

## 2019-12-07 DIAGNOSIS — Z3202 Encounter for pregnancy test, result negative: Secondary | ICD-10-CM | POA: Diagnosis not present

## 2019-12-07 DIAGNOSIS — Z3042 Encounter for surveillance of injectable contraceptive: Secondary | ICD-10-CM

## 2019-12-07 LAB — POCT URINE PREGNANCY: Preg Test, Ur: NEGATIVE

## 2019-12-07 MED ORDER — MEDROXYPROGESTERONE ACETATE 150 MG/ML IM SUSP
150.0000 mg | Freq: Once | INTRAMUSCULAR | Status: AC
  Administered 2019-12-07: 150 mg via INTRAMUSCULAR

## 2019-12-07 NOTE — Progress Notes (Signed)
Date last pap: 04/02/2017 (Negative/negative). Last Depo-Provera: Patient states it was given at her 09/24/2019 OV but injection not noted.  Last Depo-Provera noted in chart is 07/09/2019. Side Effects if any: None per patient. Urine HCG indicated? Negative. Depo-Provera 150 mg IM given by: Jennye Moccasin, CMA. Next appointment due 3/8-3/22/2021.  BP 117/68   Pulse 73   Ht 5\' 7"  (1.702 m)   Wt 259 lb 11.2 oz (117.8 kg)   BMI 40.67 kg/m

## 2019-12-07 NOTE — Telephone Encounter (Signed)
Error

## 2019-12-24 ENCOUNTER — Other Ambulatory Visit: Payer: Self-pay | Admitting: Family Medicine

## 2019-12-24 DIAGNOSIS — L219 Seborrheic dermatitis, unspecified: Secondary | ICD-10-CM

## 2019-12-27 DIAGNOSIS — M79642 Pain in left hand: Secondary | ICD-10-CM

## 2019-12-27 DIAGNOSIS — M7989 Other specified soft tissue disorders: Secondary | ICD-10-CM

## 2019-12-27 DIAGNOSIS — S6992XA Unspecified injury of left wrist, hand and finger(s), initial encounter: Secondary | ICD-10-CM

## 2019-12-28 ENCOUNTER — Other Ambulatory Visit: Payer: Self-pay

## 2019-12-28 ENCOUNTER — Telehealth: Payer: Self-pay | Admitting: Family Medicine

## 2019-12-28 ENCOUNTER — Ambulatory Visit: Payer: Medicare Other

## 2019-12-28 NOTE — Addendum Note (Signed)
Addended by: Olin Hauser on: 12/28/2019 05:01 PM   Modules accepted: Orders

## 2019-12-28 NOTE — Telephone Encounter (Signed)
Responded to MyChart.  Placed order DG Left Hand X-ray STAT  She can come by tomorrow for X-ray if we have on site, otherwise we can change to [facility].  Keep visit virtual 1/13 to discuss results  [name], [facility] 12/28/2019, 5:04 PM

## 2019-12-28 NOTE — Telephone Encounter (Signed)
In response to patient's my chart message, Spoke with the patient triaging from the phone is hard to figured it out how worst her arm is, appt is scheduled for Wednesday for virtual, unfortunately there is no opening for tomorrow and don't know if we have x-ray, advised her to go to urgent care but patient wants to wait since with Covid scare to go anywhere, advised her to send [place] the picture through my chart. She is aware that she needs to come in the office for radiology but will proceed with virtual appt.

## 2019-12-29 ENCOUNTER — Ambulatory Visit
Admission: RE | Admit: 2019-12-29 | Discharge: 2019-12-29 | Disposition: A | Discharge status: Home / Self Care | Payer: Medicare Other | Attending: Family Medicine | Admitting: Family Medicine

## 2019-12-29 ENCOUNTER — Ambulatory Visit
Admission: RE | Admit: 2019-12-29 | Discharge: 2019-12-29 | Disposition: A | Discharge status: Home / Self Care | Payer: Medicare Other | Source: Ambulatory Visit | Attending: Family Medicine | Admitting: Family Medicine

## 2019-12-29 ENCOUNTER — Ambulatory Visit: Payer: Medicare Other | Admitting: Obstetrics and Gynecology

## 2019-12-29 ENCOUNTER — Inpatient Hospital Stay: Payer: Medicare Other | Attending: Hematology and Oncology

## 2019-12-29 DIAGNOSIS — M79642 Pain in left hand: Secondary | ICD-10-CM | POA: Insufficient documentation

## 2019-12-29 DIAGNOSIS — M7989 Other specified soft tissue disorders: Secondary | ICD-10-CM | POA: Diagnosis not present

## 2019-12-29 DIAGNOSIS — S6992XA Unspecified injury of left wrist, hand and finger(s), initial encounter: Secondary | ICD-10-CM

## 2019-12-30 ENCOUNTER — Encounter: Payer: Self-pay | Admitting: Family Medicine

## 2019-12-30 ENCOUNTER — Ambulatory Visit (INDEPENDENT_AMBULATORY_CARE_PROVIDER_SITE_OTHER): Payer: Medicare Other | Admitting: Family Medicine

## 2019-12-30 ENCOUNTER — Other Ambulatory Visit: Payer: Self-pay

## 2019-12-30 DIAGNOSIS — S6992XA Unspecified injury of left wrist, hand and finger(s), initial encounter: Secondary | ICD-10-CM

## 2019-12-30 DIAGNOSIS — M79645 Pain in left finger(s): Secondary | ICD-10-CM | POA: Diagnosis not present

## 2019-12-30 DIAGNOSIS — M7989 Other specified soft tissue disorders: Secondary | ICD-10-CM | POA: Diagnosis not present

## 2019-12-30 NOTE — Progress Notes (Signed)
Virtual Visit via Telephone The purpose of this virtual visit is to provide medical care while limiting exposure to the novel coronavirus (COVID19) for both patient and office staff.  Consent was obtained for phone visit:  Yes.   Answered questions that patient had about telehealth interaction:  Yes.   I discussed the limitations, risks, security and privacy concerns of performing an evaluation and management service by telephone. I also discussed with the patient that there may be a patient responsible charge related to this service. The patient expressed understanding and agreed to proceed.  Patient Location: Home Provider Location: [facility])  ---------------------------------------------------------------------- Chief Complaint  Patient presents with  . Hand Pain    LEFT side onset couple of weeks    S: Reviewed CMA documentation. I have called patient and gathered additional HPI as follows:  LEFT Hand 4th/Ring Finger Pain, Injury  Reports that symptoms started 2+ weeks ago with acute injury, she thought may have broken or fracture dislocated had bruising swelling. Now still seem to be painful and swollen, only over that finger, she is unable to flex it well due to pain and swelling. - She was advised to get X-ray already, see results below - Worse at night causing pain Not using splint or other buddy tape or support Denies numbness tingling other joint injury, redness   Denies any high risk travel to areas of current concern for COVID19. Denies any known or suspected exposure to person with or possibly with COVID19.  Denies any fevers, chills, sweats, body ache, cough, shortness of breath, sinus pain or pressure, headache, abdominal pain, diarrhea  Past Medical History:  Diagnosis Date  . Allergy   . Anemia   . Anxiety   . Arrhythmia   . Arthritis   . Cervical dysplasia   . Cystitis   . Depression   . GERD (gastroesophageal reflux disease)   .  Gross hematuria   . Heart murmur   . HLD (hyperlipidemia)   . Hypertension   . Hypothyroid   . Pernicious anemia   . Tobacco abuse    Social History   Tobacco Use  . Smoking status: Current Every Day Smoker    Packs/day: 0.50    Years: 30.00    Pack years: 15.00    Types: Cigarettes  . Smokeless tobacco: Never Used  Substance Use Topics  . Alcohol use: No  . Drug use: No    Current Outpatient Medications:  .  albuterol (PROVENTIL HFA;VENTOLIN HFA) 108 (90 Base) MCG/ACT inhaler, INHALE 1 TO 2 PUFFS BY MOUTH AND INTO LUNGS EVERY 4 TO 6 HOURS AS NEEDED FOR SHORTNESS OF BREATH, Disp: 18 g, Rfl: 2 .  ARIPiprazole (ABILIFY) 2 MG tablet, Take 1 tablet (2 mg total) by mouth daily., Disp: , Rfl:  .  atenolol (TENORMIN) 50 MG tablet, Take 0.5 tablets (25 mg total) by mouth daily., Disp: 30 tablet, Rfl: 11 .  Cetirizine HCl (ZYRTEC ALLERGY) 10 MG CAPS, Take 1 capsule by mouth daily. , Disp: , Rfl:  .  ferrous sulfate 325 (65 FE) MG EC tablet, 1 tablet (325mg ) BID to TID based on tolerance. TAKE WITH SOURCE OF VITAMIN C., Disp: 90 tablet, Rfl: 2 .  fluticasone (FLONASE) 50 MCG/ACT nasal spray, SHAKE LIQUID AND USE 2 SPRAYS IN EACH NOSTRIL EVERY DAY, Disp: 48 g, Rfl: 1 .  fluvoxaMINE (LUVOX) 100 MG tablet, Take 150 mg by mouth 2 (two) times daily., Disp: , Rfl:  .  ketoconazole (NIZORAL) 2 %  shampoo, APPLY EXTERNALLY 2 TIMES A WEEK, Disp: 120 mL, Rfl: 2 .  lamoTRIgine (LAMICTAL) 200 MG tablet, Take 200 mg by mouth daily., Disp: , Rfl:  .  levothyroxine (SYNTHROID) 50 MCG tablet, TAKE 1 TABLET BY MOUTH EVERY MORNING, Disp: 90 tablet, Rfl: 3 .  medroxyPROGESTERone (DEPO-PROVERA) 150 MG/ML injection, Inject 1 mL (150 mg total) into the muscle every 3 (three) months., Disp: 1 mL, Rfl: 1 .  medroxyPROGESTERone (DEPO-PROVERA) 150 MG/ML injection, Inject 1 mL (150 mg total) into the muscle every 3 (three) months., Disp: 1 mL, Rfl: 0 .  pantoprazole (PROTONIX) 20 MG tablet, TAKE 1 TABLET(20 MG) BY  MOUTH DAILY, Disp: 90 tablet, Rfl: 1 .  QUEtiapine (SEROQUEL) 25 MG tablet, 25 mg 2 (two) times daily. , Disp: , Rfl:  .  SUMAtriptan (IMITREX) 5 MG/ACT nasal spray, Place 1 spray (5 mg total) every 2 (two) hours as needed into the nose for migraine. Max 3 doses in 24 hours, Disp: 1 Inhaler, Rfl: 0 .  tretinoin (RETIN-A) 0.025 % cream, Apply topically at bedtime as needed., Disp: 45 g, Rfl: 0 .  triamcinolone ointment (KENALOG) 0.1 %, Apply topically 2 (two) times daily., Disp: 30 g, Rfl: 0  Depression screen [facility] 2/9 12/30/2019 07/07/2019 01/01/2019  Decreased Interest 0 1 0  Down, Depressed, Hopeless 0 1 0  PHQ - 2 Score 0 2 0  Altered sleeping - 3 3  Tired, decreased energy - 3 0  Change in appetite - 0 0  Feeling bad or failure about yourself  - 3 0  Trouble concentrating - 0 0  Moving slowly or fidgety/restless - 0 0  Suicidal thoughts - 0 0  PHQ-9 Score - 11 3  Difficult doing work/chores - Somewhat difficult Not difficult at all    GAD 7 : Generalized Anxiety Score 01/01/2019  Nervous, Anxious, on Edge 3  Control/stop worrying 3  Worry too much - different things 3  Trouble relaxing 3  Restless 3  Easily annoyed or irritable 3  Afraid - awful might happen 3  Total GAD 7 Score 21  Anxiety Difficulty Somewhat difficult    -------------------------------------------------------------------------- O: No physical exam performed due to remote telephone encounter.  Lab results reviewed.  I have personally reviewed the radiology report from 12/29/19 Left hand X-ray.  CLINICAL DATA:  49 year old who struck her hand against a wall earlier today and complains of pain involving the ring finger and fourth metacarpal region. Initial encounter.  EXAM: LEFT HAND - COMPLETE 3+ VIEW  COMPARISON:  None.  FINDINGS: No evidence of acute fracture or dislocation. Mild narrowing of the fourth MCP joint space with associated spurring involving the head of the fourth metacarpal.  Remaining joint spaces well-preserved. Bone mineral density well-preserved. Benign appearing circumscribed lucent lesion involving the head of the proximal phalanx of the thumb. No other intrinsic osseous abnormalities.  IMPRESSION: 1. No acute osseous abnormality. 2. Mild osteoarthritis involving the fourth MCP joint. 3. Benign appearing lucent lesion involving the head of the proximal phalanx of the thumb.   Electronically Signed   By: Evangeline Dakin M.D.   On: 12/29/2019 12:55  Recent Results (from the past 2160 hour(s))  POCT urine pregnancy     Status: None   Collection Time: 12/07/19  1:43 PM  Result Value Ref Range   Preg Test, Ur Negative Negative    -------------------------------------------------------------------------- A&P:  Problem List Items Addressed This Visit    None    Visit Diagnoses    Pain  of finger of left hand    -  Primary   Finger injury, left, initial encounter       Swelling of left ring finger         Clinically history suggestive of acute sprain vs jam or contusion injury with accidental finger injury X-ray reviewed. No fracture or acute dislocation. Cannot rule out initial dislocation or other soft tissue ligament/tendon injury that now is still healing.  Recommend support / buddy tape / finger splint, avoid re-injury, give more time to heal up to 2-4 more weeks pending progress Use conservative care Tylenol PRN, Ice / heat  If not improving, I advised I can refer her to [facility] Dr Jackqulyn Livings for hand/wrist ortho specialist, may warrant further adv imaging CT/MRI for soft tissues, she may have damaged tendon  #Abnormal X-ray finding Lucent finding head of the proximal phalanx of the thumb Reviewed with her, unsure exact nature of this In future if still bothersome ultimately may warrant further imaging, radiology interprets as benign  No orders of the defined types were placed in this encounter.   Follow-up: - Return in  2-4 weeks if not improved  Patient verbalizes understanding with the above medical recommendations including the limitation of remote medical advice.  Specific follow-up and call-back criteria were given for patient to follow-up or seek medical care more urgently if needed.   - Time spent in direct consultation with patient on phone: 7 minutes   [name], [facility] 12/30/2019, 11:14 AM

## 2019-12-30 NOTE — Patient Instructions (Addendum)
Try buddy tape to support or splint to support Avoid bending/extending, avoid reinjury Likely contusion or initial dislocation/jam or sprain that is still healing, can take a few weeks If not improved motion or bending of finger or still pain and swelling, may have damaged ligament or tendon, we can refer you to specialist  Dr Jackqulyn Livings hand wrist finger specialist  [facility] Address: [address], [place], [place] [address] Hours:  9AM-5PM Phone: [phone]  Please schedule a Follow-up Appointment to: Return in about 2 weeks (around 01/13/2020), or if symptoms worsen or fail to improve, for LEFT ring finger injury.  If you have any other questions or concerns, please feel free to call the office or send a message through [place]. You may also schedule an earlier appointment if necessary.  Additionally, you may be receiving a survey about your experience at our office within a few days to 1 week by e-mail or mail. We value your feedback.  [name], DO [place]

## 2020-01-08 ENCOUNTER — Other Ambulatory Visit: Payer: Self-pay

## 2020-01-08 ENCOUNTER — Inpatient Hospital Stay: Payer: Medicare Other | Attending: Hematology and Oncology

## 2020-01-08 VITALS — BP 135/78 | HR 76 | Temp 98.1°F | Resp 18

## 2020-01-08 DIAGNOSIS — D509 Iron deficiency anemia, unspecified: Secondary | ICD-10-CM

## 2020-01-08 DIAGNOSIS — E538 Deficiency of other specified B group vitamins: Secondary | ICD-10-CM | POA: Diagnosis not present

## 2020-01-08 DIAGNOSIS — Z1231 Encounter for screening mammogram for malignant neoplasm of breast: Secondary | ICD-10-CM | POA: Diagnosis not present

## 2020-01-08 MED ORDER — CYANOCOBALAMIN 1000 MCG/ML IJ SOLN
1000.0000 ug | Freq: Once | INTRAMUSCULAR | Status: AC
  Administered 2020-01-08: 14:00:00 1000 ug via INTRAMUSCULAR

## 2020-01-15 ENCOUNTER — Other Ambulatory Visit: Payer: Self-pay | Admitting: Family Medicine

## 2020-01-15 DIAGNOSIS — R7303 Prediabetes: Secondary | ICD-10-CM

## 2020-01-17 ENCOUNTER — Other Ambulatory Visit: Payer: Self-pay | Admitting: Family Medicine

## 2020-01-17 DIAGNOSIS — L700 Acne vulgaris: Secondary | ICD-10-CM

## 2020-01-17 DIAGNOSIS — J309 Allergic rhinitis, unspecified: Secondary | ICD-10-CM

## 2020-01-26 ENCOUNTER — Inpatient Hospital Stay: Payer: Medicare Other

## 2020-01-28 DIAGNOSIS — I10 Essential (primary) hypertension: Secondary | ICD-10-CM

## 2020-01-28 MED ORDER — ATENOLOL 50 MG PO TABS: 25.0000 mg | ORAL_TABLET | Freq: Every day | ORAL | 3 refills | Status: DC

## 2020-02-11 DIAGNOSIS — F4310 Post-traumatic stress disorder, unspecified: Secondary | ICD-10-CM | POA: Diagnosis not present

## 2020-02-12 ENCOUNTER — Inpatient Hospital Stay: Payer: Medicare Other | Attending: Hematology and Oncology

## 2020-02-16 ENCOUNTER — Ambulatory Visit (INDEPENDENT_AMBULATORY_CARE_PROVIDER_SITE_OTHER): Payer: Medicare Other | Admitting: Family Medicine

## 2020-02-16 ENCOUNTER — Encounter: Payer: Self-pay | Admitting: Family Medicine

## 2020-02-16 ENCOUNTER — Other Ambulatory Visit: Payer: Self-pay

## 2020-02-16 ENCOUNTER — Other Ambulatory Visit: Payer: Self-pay | Admitting: Obstetrics and Gynecology

## 2020-02-16 ENCOUNTER — Other Ambulatory Visit: Payer: Self-pay | Admitting: Family Medicine

## 2020-02-16 VITALS — BP 116/60 | HR 75 | Temp 98.0°F | Resp 16 | Ht 67.0 in | Wt 270.0 lb

## 2020-02-16 DIAGNOSIS — Z6841 Body Mass Index (BMI) 40.0 and over, adult: Secondary | ICD-10-CM

## 2020-02-16 DIAGNOSIS — I872 Venous insufficiency (chronic) (peripheral): Secondary | ICD-10-CM

## 2020-02-16 DIAGNOSIS — R600 Localized edema: Secondary | ICD-10-CM

## 2020-02-16 MED ORDER — FUROSEMIDE 20 MG PO TABS: 20.0000 mg | ORAL_TABLET | Freq: Every day | ORAL | 1 refills | Status: DC | PRN

## 2020-02-16 NOTE — Patient Instructions (Addendum)
Thank you for coming to the office today.  Referral to vein specialist they can do ultrasound and other testing, compression stockings   [facility] [place], [place] [address]  Main: [phone]   Use RICE therapy: - R - Rest / relative rest with activity modification avoid overuse of joint - I - Ice packs (make sure you use a towel or sock / something to protect skin) - C - Compression with ACE wrap to apply pressure and reduce swelling allowing more support - E - Elevation - if significant swelling, lift leg above heart level (toes above your nose) to help reduce swelling, most helpful at night after day of being on your feet  Try Lasix furosemide 20mg  once daily as needed for swelling, caution if low BP, caution sudden standing  Try to stay well hydrated, 40 to 80 oz water daily approx  AM apt fasting and we can draw blood after  Please schedule a Follow-up Appointment to: Return in about 3 months (around 05/18/2020) for 3 month follow-up PreDM, Swelling, Weight.  If you have any other questions or concerns, please feel free to call the office or send a message through [place]. You may also schedule an earlier appointment if necessary.  Additionally, you may be receiving a survey about your experience at our office within a few days to 1 week by e-mail or mail. We value your feedback.  [name], DO [place]

## 2020-02-16 NOTE — Progress Notes (Signed)
Subjective:    Patient ID: Amanda Webb, female    DOB: [birth year], 49 y.o.   MRN: [id number]  Amanda Webb is a 49 y.o. female presenting on 02/16/2020 for Foot Swelling (onset week getting worst at night and elevation is not helping as per patient)   HPI   Bilateral Lower Extremity Edema / Morbid obesity BMI >42 - Last visit with me 12/2018 for same problem LE edema, treated with HCTZ and RICE therapy conservative approach, see prior notes for background information. - Interval update with since that time she had negative lab work up including chemistry kidney thyroid, and other evaluation. Thought to be due to higher sodium diet and variety of lifestyle dietary factors and weight gain. - Today patient reports now recurrence of same issue. Seems onset more acutely 1 week ago with bilateral LE edema symmetrical, worse if on feet and worse at end of day/night, tried elevation without significant resolution, some improvement, but still problem, worse swelling will have throbbing in legs due to swelling - Has low normal BP, she could no longer take HCTZ - Denies redness, generalized leg or calf pain, bruising, weeping of fluid, ulceration, chest pain dyspnea cough   Depression screen [facility] 2/9 12/30/2019 07/07/2019 01/01/2019  Decreased Interest 0 1 0  Down, Depressed, Hopeless 0 1 0  PHQ - 2 Score 0 2 0  Altered sleeping - 3 3  Tired, decreased energy - 3 0  Change in appetite - 0 0  Feeling bad or failure about yourself  - 3 0  Trouble concentrating - 0 0  Moving slowly or fidgety/restless - 0 0  Suicidal thoughts - 0 0  PHQ-9 Score - 11 3  Difficult doing work/chores - Somewhat difficult Not difficult at all    Social History   Tobacco Use  . Smoking status: Current Every Day Smoker    Packs/day: 0.50    Years: 30.00    Pack years: 15.00    Types: Cigarettes  . Smokeless tobacco: Never Used  Substance Use Topics  . Alcohol use: No  . Drug use: No    Review of  Systems Per HPI unless specifically indicated above     Objective:    BP 116/60   Pulse 75   Temp 98 F (36.7 C) (Oral)   Resp 16   Ht 5\' 7"  (1.702 m)   Wt 270 lb (122.5 kg)   BMI 42.29 kg/m   Wt Readings from Last 3 Encounters:  02/16/20 270 lb (122.5 kg)  12/07/19 259 lb 11.2 oz (117.8 kg)  09/24/19 256 lb 6.4 oz (116.3 kg)    Physical Exam Vitals and nursing note reviewed.  Constitutional:      General: She is not in acute distress.    Appearance: She is well-developed. She is obese. She is not diaphoretic.     Comments: Well-appearing, comfortable, cooperative  HENT:     Head: Normocephalic and atraumatic.  Eyes:     General:        Right eye: No discharge.        Left eye: No discharge.     Conjunctiva/sclera: Conjunctivae normal.  Neck:     Thyroid: No thyromegaly.  Cardiovascular:     Rate and Rhythm: Normal rate and regular rhythm.     Heart sounds: Normal heart sounds. No murmur.  Pulmonary:     Effort: Pulmonary effort is normal. No respiratory distress.     Breath sounds: Normal breath sounds. No  wheezing or rales.  Musculoskeletal:        General: Normal range of motion.     Cervical back: Normal range of motion and neck supple.     Right lower leg: Edema (+1 pitting bilateral ankle to mid calf region, symmetrical, no erythema, no varicose veins, non tender) present.     Left lower leg: Edema (+1 pitting bilateral ankle to mid calf region, symmetrical, no erythema, no varicose veins, non tender) present.  Lymphadenopathy:     Cervical: No cervical adenopathy.  Skin:    General: Skin is warm and dry.     Findings: No erythema or rash.  Neurological:     Mental Status: She is alert and oriented to person, place, and time.  Psychiatric:        Behavior: Behavior normal.     Comments: Well groomed, good eye contact, normal speech and thoughts        Results for orders placed or performed in visit on 12/07/19  POCT urine pregnancy  Result Value  Ref Range   Preg Test, Ur Negative Negative      Assessment & Plan:   Problem List Items Addressed This Visit    BMI 40.0-44.9, adult (HCC)   Bilateral lower extremity edema - Primary   Relevant Medications   furosemide (LASIX) 20 MG tablet   Other Relevant Orders   Ambulatory referral to Vascular Surgery    Other Visit Diagnoses    Venous insufficiency of both lower extremities       Relevant Medications   furosemide (LASIX) 20 MG tablet   Other Relevant Orders   Ambulatory referral to Vascular Surgery      Clinically with LE edema acute on chronic flare up, has had similar issues over past 2-3 years with episodic worsening swelling. Likely multifactorial with probable venous insufficiency and obesity and diet / sodium / fluid contributing. - No history to suggest other organic cause such as cardiac or renal based on past evaluation, blood work, and imaging - No vascular ultrasound documented  Plan - Discussed med management for acute symptoms - will add Furosemide 20mg  daily PRN goal to use only as need 3-7 days approx then use PRN, caution over use, risk on medication, caution sudden position change lightheaded / low BP, and risk of AKI vs CKD - No longer on thiazide - Goal to maintain adequate hydration, Limit salt - RICE therapy, emphasis on increasing compression, can use compression stockings  Referral to Vascular for further evaluation and would benefit from venous doppler to eval for reflux/insufficiency  Orders Placed This Encounter  Procedures  . Ambulatory referral to Vascular Surgery    Referral Priority:   Routine    Referral Type:   Surgical    Referral Reason:   Specialty Services Required    Requested Specialty:   Vascular Surgery    Number of Visits Requested:   1     Meds ordered this encounter  Medications  . furosemide (LASIX) 20 MG tablet    Sig: Take 1 tablet (20 mg total) by mouth daily as needed for edema.    Dispense:  30 tablet    Refill:   1    Recommend to take for 3-7 days at a time if need. May double dose if needed.      Follow up plan: Return in about 3 months (around 05/18/2020) for 3 month follow-up PreDM, Swelling, Weight.  Future labs to be ordered at time of next visit in  AM can be fasting, anticipated will need A1c but may need other blood work  [name], [facility] 02/16/2020, 2:45 PM

## 2020-02-22 ENCOUNTER — Other Ambulatory Visit: Payer: Self-pay

## 2020-02-22 ENCOUNTER — Ambulatory Visit (INDEPENDENT_AMBULATORY_CARE_PROVIDER_SITE_OTHER): Payer: Medicare Other | Admitting: Surgical

## 2020-02-22 VITALS — BP 137/77 | HR 80 | Ht 67.0 in | Wt 270.4 lb

## 2020-02-22 DIAGNOSIS — Z3042 Encounter for surveillance of injectable contraceptive: Secondary | ICD-10-CM | POA: Diagnosis not present

## 2020-02-22 MED ORDER — MEDROXYPROGESTERONE ACETATE 150 MG/ML IM SUSP
150.0000 mg | Freq: Once | INTRAMUSCULAR | Status: AC
  Administered 2020-02-22: 150 mg via INTRAMUSCULAR

## 2020-02-22 NOTE — Progress Notes (Signed)
Date last pap: NA Last Depo-Provera: 12/07/19 Side Effects if any: NA Serum HCG indicated? NA Depo-Provera 150 mg IM given by: [facility] CMA Next appointment due 05/09/20-05/23/2020  Today's Vitals   02/22/20 1429  BP: 137/77  Pulse: 80  Weight: 270 lb 6.4 oz (122.7 kg)  Height: 5\' 7"  (1.702 m)   Body mass index is 42.35 kg/m.

## 2020-02-23 ENCOUNTER — Inpatient Hospital Stay: Payer: Medicare Other

## 2020-03-09 DIAGNOSIS — I872 Venous insufficiency (chronic) (peripheral): Secondary | ICD-10-CM | POA: Insufficient documentation

## 2020-03-09 DIAGNOSIS — I890 Lymphedema, not elsewhere classified: Secondary | ICD-10-CM | POA: Insufficient documentation

## 2020-03-09 DIAGNOSIS — N3946 Mixed incontinence: Secondary | ICD-10-CM

## 2020-03-09 NOTE — Progress Notes (Signed)
MRN : [id number]  Amanda Webb is a 49 y.o. (1971-09-09) female who presents with chief complaint of No chief complaint on file. Marland Kitchen  History of Present Illness:   Patient is seen for evaluation of leg pain and leg swelling. The patient first noticed the swelling remotely. The swelling is associated with pain and discoloration. The pain and swelling worsens with prolonged dependency and improves with elevation. The pain is unrelated to activity.  The patient notes that in the morning the legs are significantly improved but they steadily worsened throughout the course of the day. The patient also notes a steady worsening of the discoloration in the ankle and shin area.   The patient denies claudication symptoms.  The patient denies symptoms consistent with rest pain.  The patient denies and extensive history of DJD and LS spine disease.  The patient has no had any past angiography, interventions or vascular surgery.  Elevation makes the leg symptoms better, dependency makes them much worse. There is no history of ulcerations. The patient denies any recent changes in medications.  The patient has not been wearing graduated compression.  The patient denies a history of DVT or PE. There is no prior history of phlebitis. There is no history of primary lymphedema.  No history of malignancies. No history of trauma or groin or pelvic surgery. There is no history of radiation treatment to the groin or pelvis  The patient denies amaurosis fugax or recent TIA symptoms. There are no recent neurological changes noted. The patient denies recent episodes of angina or shortness of breath  No outpatient medications have been marked as taking for the 03/10/20 encounter (Appointment) with Delana Meyer, Dolores Lory, MD.    Past Medical History:  Diagnosis Date  . Allergy   . Anemia   . Anxiety   . Arrhythmia   . Arthritis   . Cervical dysplasia   . Cystitis   . Depression   . GERD  (gastroesophageal reflux disease)   . Gross hematuria   . Heart murmur   . HLD (hyperlipidemia)   . Hypertension   . Hypothyroid   . Pernicious anemia   . Tobacco abuse     Past Surgical History:  Procedure Laterality Date  . CARPAL TUNNEL RELEASE Bilateral 2011  . FOOT SURGERY Right 2013   Plantar fascia  . TONSILLECTOMY  1979    Social History Social History   Tobacco Use  . Smoking status: Current Every Day Smoker    Packs/day: 0.50    Years: 30.00    Pack years: 15.00    Types: Cigarettes  . Smokeless tobacco: Never Used  Substance Use Topics  . Alcohol use: No  . Drug use: No    Family History Family History  Problem Relation Age of Onset  . Depression Mother   . Mental illness Mother   . Alcohol abuse Mother   . Cancer Maternal Grandmother   . Diabetes Maternal Grandmother   . Ovarian cancer Maternal Grandmother   . Kidney disease Maternal Grandfather   . Diabetes Father   . Mental illness Father   . Depression Father   . Drug abuse Father   . Depression Paternal Grandfather   . Drug abuse Paternal Grandfather   . Bladder Cancer Neg Hx   . Breast cancer Neg Hx   No family history of bleeding/clotting disorders, porphyria or autoimmune disease   Allergies  Allergen Reactions  . Bee Venom Anaphylaxis, Hives and Swelling    Carries  Epi pen. Carries Epi pen.  . Cat Hair Extract Itching, Other (See Comments) and Swelling    Allergic to trees, nuts, wheat, grass, cats & dogs - itchy watery eyes, swelling. Uses Zyrtec & Flonase & Benadryl if really bad. Used to get allergy shots. Allergic to trees, nuts, wheat, grass, cats & dogs - itchy watery eyes, swelling. Uses Zyrtec & Flonase & Benadryl if really bad. Used to get allergy shots.  . Tetracycline     Other reaction(s): Unknown  . Tetracyclines & Related Nausea And Vomiting  . Lac Bovis Diarrhea and Nausea And Vomiting  . Lactose Diarrhea and Nausea And Vomiting  . Milk Protein Diarrhea and Nausea  And Vomiting  . Tape Other (See Comments) and Rash    Needs to use paper tape. Breaks out with severe rash, pulls skin off when using adhesive.     REVIEW OF SYSTEMS (Negative unless checked)  Constitutional: [] Weight loss  [] Fever  [] Chills Cardiac: [] Chest pain   [] Chest pressure   [] Palpitations   [] Shortness of breath when laying flat   [] Shortness of breath with exertion. Vascular:  [] Pain in legs with walking   [x] Pain in legs at rest  [] History of DVT   [] Phlebitis   [x] Swelling in legs   [x] Varicose veins   [] Non-healing ulcers Pulmonary:   [] Uses home oxygen   [] Productive cough   [] Hemoptysis   [] Wheeze  [] COPD   [] Asthma Neurologic:  [] Dizziness   [] Seizures   [] History of stroke   [] History of TIA  [] Aphasia   [] Vissual changes   [] Weakness or numbness in arm   [] Weakness or numbness in leg Musculoskeletal:   [] Joint swelling   [] Joint pain   [] Low back pain Hematologic:  [] Easy bruising  [] Easy bleeding   [] Hypercoagulable state   [] Anemic Gastrointestinal:  [] Diarrhea   [] Vomiting  [] Gastroesophageal reflux/heartburn   [] Difficulty swallowing. Genitourinary:  [] Chronic kidney disease   [] Difficult urination  [] Frequent urination   [] Blood in urine Skin:  [] Rashes   [] Ulcers  Psychological:  [] History of anxiety   []  History of major depression.  Physical Examination  There were no vitals filed for this visit. There is no height or weight on file to calculate BMI. Gen: WD/WN, NAD Head: [place]/AT, No temporalis wasting.  Ear/Nose/Throat: Hearing grossly intact, nares w/o erythema or drainage, poor dentition Eyes: PER, EOMI, sclera nonicteric.  Neck: Supple, no masses.  No bruit or JVD.  Pulmonary:  Good air movement, clear to auscultation bilaterally, no use of accessory muscles.  Cardiac: RRR, normal S1, S2, no Murmurs. Vascular: scattered varicosities present bilaterally.  Mild venous stasis changes to the legs bilaterally.  2+ soft pitting edema Vessel Right Left  PT  Palpable Palpable  DP Palpable Palpable  Gastrointestinal: soft, non-distended. No guarding/no peritoneal signs.  Musculoskeletal: M/S 5/5 throughout.  No deformity or atrophy.  Neurologic: CN 2-12 intact. Pain and light touch intact in extremities.  Symmetrical.  Speech is fluent. Motor exam as listed above. Psychiatric: Judgment intact, Mood & affect appropriate for pt's clinical situation. Dermatologic: No rashes or ulcers noted.  No changes consistent with cellulitis.   CBC Lab Results  Component Value Date   WBC 9.5 05/19/2019   HGB 14.8 05/19/2019   HCT 43.0 05/19/2019   MCV 93.5 05/19/2019   PLT 243 05/19/2019    BMET    Component Value Date/Time   NA 141 01/01/2019 1025   K 4.2 01/01/2019 1025   CL 106 01/01/2019 1025   CO2 27  01/01/2019 1025   GLUCOSE 133 (H) 01/01/2019 1025   BUN 9 01/01/2019 1025   CREATININE 0.65 01/01/2019 1025   CALCIUM 9.1 01/01/2019 1025   GFRNONAA 106 01/01/2019 1025   GFRAA 123 01/01/2019 1025   CrCl cannot be calculated (Patient's most recent lab result is older than the maximum 21 days allowed.).  COAG No results found for: INR, PROTIME  Radiology No results found.   Assessment/Plan 1. Lymphedema I have had a long discussion with the patient regarding swelling and why it  causes symptoms.  Patient will begin wearing graduated compression stockings class 1 (20-30 mmHg) on a daily basis a prescription was given. The patient will  beginning wearing the stockings first thing in the morning and removing them in the evening. The patient is instructed specifically not to sleep in the stockings.   In addition, behavioral modification will be initiated.  This will include frequent elevation, use of over the counter pain medications and exercise such as walking.  I have reviewed systemic causes for chronic edema such as liver, kidney and cardiac etiologies.  The patient denies problems with these organ systems.    Consideration for a lymph  pump will also be made based upon the effectiveness of conservative therapy.  This would help to improve the edema control and prevent sequela such as ulcers and infections   Patient should undergo duplex ultrasound of the venous system to ensure that DVT or reflux is not present.  The patient will follow-up with me after the ultrasound.   - VAS [place] LOWER EXTREMITY VENOUS (DVT); Future  2. Chronic venous insufficiency No surgery or intervention at this point in time.    I have had a long discussion with the patient regarding venous insufficiency and why it  causes symptoms. I have discussed with the patient the chronic skin changes that accompany venous insufficiency and the long term sequela such as infection and ulceration.  Patient will begin wearing graduated compression stockings class 1 (20-30 mmHg) or compression wraps on a daily basis a prescription was given. The patient will put the stockings on first thing in the morning and removing them in the evening. The patient is instructed specifically not to sleep in the stockings.    In addition, behavioral modification including several periods of elevation of the lower extremities during the day will be continued. I have demonstrated that proper elevation is a position with the ankles at heart level.  The patient is instructed to begin routine exercise, especially walking on a daily basis  Patient should undergo duplex ultrasound of the venous system to ensure that DVT or reflux is not present.  Following the review of the ultrasound the patient will follow up in 2-3 months to reassess the degree of swelling and the control that graduated compression stockings or compression wraps  is offering.   The patient can be assessed for a Lymph Pump at that time - VAS [place] LOWER EXTREMITY VENOUS (DVT); Future  3. Essential hypertension Continue antihypertensive medications as already ordered, these medications have been reviewed and there are no  changes at this time.   4. Mixed hyperlipidemia Continue statin as ordered and reviewed, no changes at this time   Hortencia Pilar, MD  03/09/2020 11:42 AM

## 2020-03-10 ENCOUNTER — Ambulatory Visit (INDEPENDENT_AMBULATORY_CARE_PROVIDER_SITE_OTHER): Payer: Medicare Other | Admitting: Vascular Surgery

## 2020-03-10 ENCOUNTER — Other Ambulatory Visit: Payer: Self-pay

## 2020-03-10 ENCOUNTER — Encounter (INDEPENDENT_AMBULATORY_CARE_PROVIDER_SITE_OTHER): Payer: Self-pay | Admitting: Vascular Surgery

## 2020-03-10 VITALS — BP 128/79 | HR 76 | Ht 67.0 in | Wt 269.0 lb

## 2020-03-10 DIAGNOSIS — I10 Essential (primary) hypertension: Secondary | ICD-10-CM

## 2020-03-10 DIAGNOSIS — I890 Lymphedema, not elsewhere classified: Secondary | ICD-10-CM

## 2020-03-10 DIAGNOSIS — E782 Mixed hyperlipidemia: Secondary | ICD-10-CM

## 2020-03-10 DIAGNOSIS — I872 Venous insufficiency (chronic) (peripheral): Secondary | ICD-10-CM | POA: Diagnosis not present

## 2020-03-11 ENCOUNTER — Inpatient Hospital Stay: Payer: Medicare Other | Attending: Oncology

## 2020-03-11 VITALS — Temp 98.7°F

## 2020-03-11 DIAGNOSIS — E538 Deficiency of other specified B group vitamins: Secondary | ICD-10-CM | POA: Insufficient documentation

## 2020-03-11 DIAGNOSIS — D509 Iron deficiency anemia, unspecified: Secondary | ICD-10-CM

## 2020-03-11 MED ORDER — CYANOCOBALAMIN 1000 MCG/ML IJ SOLN
1000.0000 ug | Freq: Once | INTRAMUSCULAR | Status: AC
  Administered 2020-03-11: 1000 ug via INTRAMUSCULAR

## 2020-03-11 NOTE — Patient Instructions (Signed)

## 2020-03-22 ENCOUNTER — Inpatient Hospital Stay: Payer: Medicare Other

## 2020-03-24 DIAGNOSIS — F4310 Post-traumatic stress disorder, unspecified: Secondary | ICD-10-CM | POA: Diagnosis not present

## 2020-03-25 DIAGNOSIS — Z79899 Other long term (current) drug therapy: Secondary | ICD-10-CM | POA: Diagnosis not present

## 2020-03-29 ENCOUNTER — Other Ambulatory Visit: Payer: Self-pay | Admitting: Family Medicine

## 2020-03-29 DIAGNOSIS — K219 Gastro-esophageal reflux disease without esophagitis: Secondary | ICD-10-CM

## 2020-04-01 DIAGNOSIS — F4310 Post-traumatic stress disorder, unspecified: Secondary | ICD-10-CM | POA: Diagnosis not present

## 2020-04-04 ENCOUNTER — Ambulatory Visit: Payer: Medicare Other | Admitting: Urology

## 2020-04-07 ENCOUNTER — Telehealth: Payer: Self-pay | Admitting: Family Medicine

## 2020-04-07 ENCOUNTER — Ambulatory Visit: Payer: Medicare Other | Attending: Internal Medicine

## 2020-04-07 DIAGNOSIS — Z20822 Contact with and (suspected) exposure to covid-19: Secondary | ICD-10-CM | POA: Diagnosis not present

## 2020-04-07 NOTE — Chronic Care Management (AMB) (Signed)
  Chronic Care Management   Note  04/07/2020 Name: Amanda Webb MRN: [id number] DOB: 06-13-1971  Amanda Webb is a 49 y.o. year old female who is a primary care patient of Olin Hauser, DO. I reached out to [name] by phone today in response to a referral sent by Amanda Webb's health plan.     [name] was given information about Chronic Care Management services today including:  1. CCM service includes personalized support from designated clinical staff supervised by her physician, including individualized plan of care and coordination with other care providers 2. 24/7 contact phone numbers for assistance for urgent and routine care needs. 3. Service will only be billed when office clinical staff spend 20 minutes or more in a month to coordinate care. 4. Only one practitioner may furnish and bill the service in a calendar month. 5. The patient may stop CCM services at any time (effective at the end of the month) by phone call to the office staff. 6. The patient will be responsible for cost sharing (co-pay) of up to 20% of the service fee (after annual deductible is met).  Patient did not agree to enrollment in care management services and does not wish to consider at this time.  Follow up plan: The patient has been provided with contact information for the care management team and has been advised to call with any health related questions or concerns.   [place], [place] [address] Direct Dial: [phone] [email or web address]: [email or web address]

## 2020-04-08 ENCOUNTER — Inpatient Hospital Stay: Payer: Medicare Other | Attending: Hematology and Oncology

## 2020-04-08 ENCOUNTER — Other Ambulatory Visit: Payer: Self-pay

## 2020-04-08 VITALS — BP 141/84 | HR 78 | Temp 98.0°F | Resp 18

## 2020-04-08 DIAGNOSIS — E538 Deficiency of other specified B group vitamins: Secondary | ICD-10-CM | POA: Insufficient documentation

## 2020-04-08 DIAGNOSIS — D509 Iron deficiency anemia, unspecified: Secondary | ICD-10-CM

## 2020-04-08 LAB — NOVEL CORONAVIRUS, NAA: SARS-CoV-2, NAA: NOT DETECTED

## 2020-04-08 LAB — SARS-COV-2, NAA 2 DAY TAT

## 2020-04-08 MED ORDER — CYANOCOBALAMIN 1000 MCG/ML IJ SOLN
1000.0000 ug | Freq: Once | INTRAMUSCULAR | Status: AC
  Administered 2020-04-08: 1000 ug via INTRAMUSCULAR

## 2020-04-08 NOTE — Patient Instructions (Signed)

## 2020-04-11 ENCOUNTER — Ambulatory Visit: Payer: Medicare Other | Admitting: Urology

## 2020-04-19 ENCOUNTER — Ambulatory Visit: Payer: Medicare Other

## 2020-04-25 ENCOUNTER — Encounter: Payer: Self-pay | Admitting: Urology

## 2020-04-25 ENCOUNTER — Ambulatory Visit (INDEPENDENT_AMBULATORY_CARE_PROVIDER_SITE_OTHER): Payer: Medicare Other | Admitting: Urology

## 2020-04-25 ENCOUNTER — Other Ambulatory Visit: Payer: Self-pay

## 2020-04-25 VITALS — BP 117/74 | HR 81 | Ht 67.0 in | Wt 275.0 lb

## 2020-04-25 DIAGNOSIS — N3946 Mixed incontinence: Secondary | ICD-10-CM | POA: Diagnosis not present

## 2020-04-25 DIAGNOSIS — R32 Unspecified urinary incontinence: Secondary | ICD-10-CM | POA: Diagnosis not present

## 2020-04-25 LAB — URINALYSIS, COMPLETE
Bilirubin, UA: NEGATIVE
Glucose, UA: NEGATIVE
Ketones, UA: NEGATIVE
Leukocytes,UA: NEGATIVE
Nitrite, UA: NEGATIVE
Protein,UA: NEGATIVE
Specific Gravity, UA: 1.02 (ref 1.005–1.030)
Urobilinogen, Ur: 0.2 mg/dL (ref 0.2–1.0)
pH, UA: 6.5 (ref 5.0–7.5)

## 2020-04-25 LAB — MICROSCOPIC EXAMINATION

## 2020-04-25 LAB — BLADDER SCAN AMB NON-IMAGING: Scan Result: 54

## 2020-04-25 NOTE — Progress Notes (Signed)
04/25/2020 9:24 AM   Amanda Webb [birth year] [id number]  Referring provider: Olin Hauser, DO [address] [place],  [place] [address]  Chief Complaint  Patient presents with  . Urinary Incontinence    HPI: Amanda Webb 2017: [place] incontinence; negative hematuria work-up; very anxious patient, skull therapy consultation  Today Patient has worsening incontinence.  She has urge incontinence that can be high-volume.  She leaks a lot with coughing sneezing but only with bending lifting her bladder is full.  No bedwetting.  She can soak 3 pads a day  She voids every hour.  She gets up once or twice at night.  No recent history of bladder infections.  No kidney stone or bladder surgery.  Borderline diabetic.  No hysterectomy.  Normal bowel movements.  Never took medicine.  Never saw physical therapy     PMH: Past Medical History:  Diagnosis Date  . Allergy   . Anemia   . Anxiety   . Arrhythmia   . Arthritis   . Cervical dysplasia   . Cystitis   . Depression   . GERD (gastroesophageal reflux disease)   . Gross hematuria   . Heart murmur   . HLD (hyperlipidemia)   . Hypertension   . Hypothyroid   . Pernicious anemia   . Tobacco abuse     Surgical History: Past Surgical History:  Procedure Laterality Date  . CARPAL TUNNEL RELEASE Bilateral 2011  . FOOT SURGERY Right 2013   Plantar fascia  . TONSILLECTOMY  1979    Home Medications:  Allergies as of 04/25/2020      Reactions   Bee Venom Anaphylaxis, Hives, Swelling   Carries Epi pen. Carries Epi pen.   Cat Hair Extract Itching, Other (See Comments), Swelling   Allergic to trees, nuts, wheat, grass, cats & dogs - itchy watery eyes, swelling. Uses Zyrtec & Flonase & Benadryl if really bad. Used to get allergy shots. Allergic to trees, nuts, wheat, grass, cats & dogs - itchy watery eyes, swelling. Uses Zyrtec & Flonase & Benadryl if really bad. Used to get allergy shots.   Tetracycline    Other  reaction(s): Unknown   Tetracyclines & Related Nausea And Vomiting   Lac Bovis Diarrhea, Nausea And Vomiting   Lactose Diarrhea, Nausea And Vomiting   Milk Protein Diarrhea, Nausea And Vomiting   Tape Other (See Comments), Rash   Needs to use paper tape. Breaks out with severe rash, pulls skin off when using adhesive.      Medication List       Accurate as of Apr 25, 2020  9:24 AM. If you have any questions, ask your nurse or doctor.        Abilify 2 MG tablet Generic drug: ARIPiprazole Take 1 tablet (2 mg total) by mouth daily.   Accu-Chek FastClix Lancets Misc USE TO CHECK SUGAR UP TO TWICE DAILY AS DIRECTED   Accu-Chek Guide test strip Generic drug: glucose blood CHECK BLOOD SUGAR UP TO TWICE DAILY AS DIRECTED   albuterol 108 (90 Base) MCG/ACT inhaler Commonly known as: VENTOLIN HFA INHALE 1 TO 2 PUFFS BY MOUTH INTO THE LUNGS EVERY 4 TO 6 HOURS AS NEEDED FOR SHORTNESS OF BREATH   atenolol 50 MG tablet Commonly known as: TENORMIN Take 0.5 tablets (25 mg total) by mouth daily.   ferrous sulfate 325 (65 FE) MG EC tablet 1 tablet (325mg ) BID to TID based on tolerance. TAKE WITH SOURCE OF VITAMIN C.   fluticasone 50 MCG/ACT  nasal spray Commonly known as: FLONASE SHAKE LIQUID AND USE 2 SPRAYS IN EACH NOSTRIL EVERY DAY   fluvoxaMINE 100 MG tablet Commonly known as: LUVOX Take 150 mg by mouth 2 (two) times daily.   furosemide 20 MG tablet Commonly known as: LASIX TAKE 1 TABLET(20 MG) BY MOUTH DAILY AS NEEDED FOR SWELLING   ketoconazole 2 % shampoo Commonly known as: NIZORAL APPLY EXTERNALLY 2 TIMES A WEEK   lamoTRIgine 200 MG tablet Commonly known as: LAMICTAL Take 200 mg by mouth daily.   levothyroxine 50 MCG tablet Commonly known as: SYNTHROID TAKE 1 TABLET BY MOUTH EVERY MORNING   medroxyPROGESTERone 150 MG/ML injection Commonly known as: DEPO-PROVERA Inject 1 mL (150 mg total) into the muscle every 3 (three) months.   medroxyPROGESTERone 150 MG/ML  injection Commonly known as: DEPO-PROVERA ADMINISTER 1 ML(150 MG) IN THE MUSCLE EVERY 3 MONTHS   pantoprazole 20 MG tablet Commonly known as: PROTONIX TAKE 1 TABLET(20 MG) BY MOUTH DAILY   QUEtiapine 25 MG tablet Commonly known as: SEROQUEL 25 mg 2 (two) times daily.   SUMAtriptan 5 MG/ACT nasal spray Commonly known as: IMITREX Place 1 spray (5 mg total) every 2 (two) hours as needed into the nose for migraine. Max 3 doses in 24 hours   tretinoin 0.025 % cream Commonly known as: RETIN-A APPLY EXTERNALLY TO THE AFFECTED AREA AT BEDTIME AS NEEDED   triamcinolone ointment 0.1 % Commonly known as: KENALOG Apply topically 2 (two) times daily.   ZyrTEC Allergy 10 MG Caps Generic drug: Cetirizine HCl Take 1 capsule by mouth daily.       Allergies:  Allergies  Allergen Reactions  . Bee Venom Anaphylaxis, Hives and Swelling    Carries Epi pen. Carries Epi pen.  . Cat Hair Extract Itching, Other (See Comments) and Swelling    Allergic to trees, nuts, wheat, grass, cats & dogs - itchy watery eyes, swelling. Uses Zyrtec & Flonase & Benadryl if really bad. Used to get allergy shots. Allergic to trees, nuts, wheat, grass, cats & dogs - itchy watery eyes, swelling. Uses Zyrtec & Flonase & Benadryl if really bad. Used to get allergy shots.  . Tetracycline     Other reaction(s): Unknown  . Tetracyclines & Related Nausea And Vomiting  . Lac Bovis Diarrhea and Nausea And Vomiting  . Lactose Diarrhea and Nausea And Vomiting  . Milk Protein Diarrhea and Nausea And Vomiting  . Tape Other (See Comments) and Rash    Needs to use paper tape. Breaks out with severe rash, pulls skin off when using adhesive.    Family History: Family History  Problem Relation Age of Onset  . Depression Mother   . Mental illness Mother   . Alcohol abuse Mother   . Cancer Maternal Grandmother   . Diabetes Maternal Grandmother   . Ovarian cancer Maternal Grandmother   . Kidney disease Maternal Grandfather    . Diabetes Father   . Mental illness Father   . Depression Father   . Drug abuse Father   . Depression Paternal Grandfather   . Drug abuse Paternal Grandfather   . Bladder Cancer Neg Hx   . Breast cancer Neg Hx     Social History:  reports that she has been smoking cigarettes. She has a 15.00 pack-year smoking history. She has never used smokeless tobacco. She reports that she does not drink alcohol or use drugs.  ROS:  Physical Exam: BP 117/74   Pulse 81   Ht 5\' 7"  (1.702 m)   Wt 275 lb (124.7 kg)   BMI 43.07 kg/m   Constitutional:  Alert and oriented, No acute distress. HEENT: [place] AT, moist mucus membranes.  Trachea midline, no masses. Cardiovascular: No clubbing, cyanosis, or edema. Respiratory: Normal respiratory effort, no increased work of breathing. GI: Abdomen is soft, nontender, nondistended, no abdominal masses GU: No CVA tenderness.  Mild grade 2 hypermobility the bladder neck.  Mild positive cough test.  No cystocele or rectocele.  Moderate pannus still able to retropubic sling Skin: No rashes, bruises or suspicious lesions. Lymph: No cervical or inguinal adenopathy. Neurologic: Grossly intact, no focal deficits, moving all 4 extremities. Psychiatric: Normal mood and affect.  Laboratory Data: Lab Results  Component Value Date   WBC 9.5 05/19/2019   HGB 14.8 05/19/2019   HCT 43.0 05/19/2019   MCV 93.5 05/19/2019   PLT 243 05/19/2019    Lab Results  Component Value Date   CREATININE 0.65 01/01/2019    No results found for: PSA  No results found for: TESTOSTERONE  Lab Results  Component Value Date   HGBA1C 5.6 09/24/2019    Urinalysis    Component Value Date/Time   COLORURINE YELLOW (A) 04/08/2018 1204   APPEARANCEUR CLEAR (A) 04/08/2018 1204   APPEARANCEUR Hazy (A) 09/11/2016 0834   LABSPEC 1.021 04/08/2018 1204   PHURINE 6.0 04/08/2018 1204   GLUCOSEU NEGATIVE 04/08/2018 1204    HGBUR SMALL (A) 04/08/2018 1204   BILIRUBINUR NEGATIVE 04/08/2018 1204   BILIRUBINUR negative 10/28/2017 1608   BILIRUBINUR Negative 09/11/2016 [place] 04/08/2018 1204   PROTEINUR NEGATIVE 04/08/2018 1204   UROBILINOGEN 0.2 10/28/2017 1608   NITRITE NEGATIVE 04/08/2018 1204   LEUKOCYTESUR NEGATIVE 04/08/2018 1204   LEUKOCYTESUR Negative 09/11/2016 0834    Pertinent Imaging: Urine reviewed.  Urine culture sent.  Chart reviewed.  Urodynamics ordered   Assessment & Plan: Patient has mixed incontinence.  Patient will return with urodynamics.  1. Urinary incontinence in female  - Urinalysis, Complete - Bladder Scan (Post Void Residual) in office   No follow-ups on file.  Reece Packer, MD  [facility] [address] [place], [place] [address] [phone]

## 2020-04-27 LAB — URINE CULTURE

## 2020-05-06 ENCOUNTER — Other Ambulatory Visit: Payer: Self-pay

## 2020-05-06 ENCOUNTER — Inpatient Hospital Stay: Payer: Medicare Other | Attending: Hematology and Oncology

## 2020-05-06 VITALS — BP 131/84 | HR 88 | Temp 99.7°F

## 2020-05-06 DIAGNOSIS — E538 Deficiency of other specified B group vitamins: Secondary | ICD-10-CM | POA: Insufficient documentation

## 2020-05-06 DIAGNOSIS — D509 Iron deficiency anemia, unspecified: Secondary | ICD-10-CM

## 2020-05-06 MED ORDER — CYANOCOBALAMIN 1000 MCG/ML IJ SOLN
1000.0000 ug | Freq: Once | INTRAMUSCULAR | Status: AC
  Administered 2020-05-06: 1000 ug via INTRAMUSCULAR

## 2020-05-06 NOTE — Patient Instructions (Signed)

## 2020-05-09 ENCOUNTER — Ambulatory Visit: Payer: Medicare Other

## 2020-05-17 ENCOUNTER — Ambulatory Visit: Payer: Medicare Other | Admitting: Hematology and Oncology

## 2020-05-17 ENCOUNTER — Other Ambulatory Visit: Payer: Medicare Other

## 2020-05-17 ENCOUNTER — Ambulatory Visit: Payer: Medicare Other

## 2020-05-19 DIAGNOSIS — F4310 Post-traumatic stress disorder, unspecified: Secondary | ICD-10-CM | POA: Diagnosis not present

## 2020-05-19 NOTE — Progress Notes (Signed)
Date last pap: 09/24/2019. Last Depo-Provera: 02/22/2020. Side Effects if any: none. Serum HCG indicated? N/A. Depo-Provera 150 mg IM given by: FHampton, LPN. Next appointment due August 20-Sept 3, 2021.

## 2020-05-20 ENCOUNTER — Other Ambulatory Visit: Payer: Self-pay

## 2020-05-20 ENCOUNTER — Ambulatory Visit (INDEPENDENT_AMBULATORY_CARE_PROVIDER_SITE_OTHER): Payer: Medicare Other

## 2020-05-20 DIAGNOSIS — Z3042 Encounter for surveillance of injectable contraceptive: Secondary | ICD-10-CM | POA: Diagnosis not present

## 2020-05-20 MED ORDER — MEDROXYPROGESTERONE ACETATE 150 MG/ML IM SUSP
150.0000 mg | Freq: Once | INTRAMUSCULAR | Status: AC
  Administered 2020-05-20: 150 mg via INTRAMUSCULAR

## 2020-05-20 MED ORDER — MEDROXYPROGESTERONE ACETATE 150 MG/ML IM SUSP: INTRAMUSCULAR | 1 refills | Status: DC

## 2020-05-23 ENCOUNTER — Ambulatory Visit: Payer: Self-pay | Admitting: Urology

## 2020-05-25 ENCOUNTER — Other Ambulatory Visit: Payer: Self-pay | Admitting: Family Medicine

## 2020-05-25 DIAGNOSIS — R600 Localized edema: Secondary | ICD-10-CM

## 2020-05-25 DIAGNOSIS — I872 Venous insufficiency (chronic) (peripheral): Secondary | ICD-10-CM

## 2020-05-25 NOTE — Telephone Encounter (Signed)
Requested Prescriptions  Pending Prescriptions Disp Refills  . furosemide (LASIX) 20 MG tablet [Pharmacy Med Name: FUROSEMIDE 20MG  TABLETS] 90 tablet 0    Sig: TAKE 1 TABLET(20 MG) BY MOUTH DAILY AS NEEDED FOR SWELLING     Cardiovascular:  Diuretics - Loop Failed - 05/25/2020 11:05 AM      Failed - K in normal range and within 360 days    Potassium  Date Value Ref Range Status  01/01/2019 4.2 3.5 - 5.3 mmol/L Final         Failed - Ca in normal range and within 360 days    Calcium  Date Value Ref Range Status  01/01/2019 9.1 8.6 - 10.2 mg/dL Final         Failed - Na in normal range and within 360 days    Sodium  Date Value Ref Range Status  01/01/2019 141 135 - 146 mmol/L Final         Failed - Cr in normal range and within 360 days    Creat  Date Value Ref Range Status  01/01/2019 0.65 0.50 - 1.10 mg/dL Final         Passed - Last BP in normal range    BP Readings from Last 1 Encounters:  05/06/20 131/84         Passed - Valid encounter within last 6 months    Recent Outpatient Visits          3 months ago Bilateral lower extremity edema   [place], DO   4 months ago Pain of finger of left hand   [place], DO   9 months ago Acute non-recurrent pansinusitis   [facility] Mikey College, NP   1 year ago Essential hypertension   [place], Devonne Doughty, DO   1 year ago Acute bronchitis with bronchospasm   [place], Devonne Doughty, DO      Future Appointments            In 1 week MacDiarmid, Nicki Reaper, MD [place]   In 4 months Amalia Hailey, Nyoka Lint, MD [facility]

## 2020-06-02 ENCOUNTER — Other Ambulatory Visit: Payer: Self-pay

## 2020-06-02 ENCOUNTER — Inpatient Hospital Stay: Payer: Medicare Other | Attending: Hematology and Oncology

## 2020-06-02 DIAGNOSIS — E538 Deficiency of other specified B group vitamins: Secondary | ICD-10-CM | POA: Insufficient documentation

## 2020-06-02 DIAGNOSIS — D509 Iron deficiency anemia, unspecified: Secondary | ICD-10-CM

## 2020-06-02 DIAGNOSIS — I872 Venous insufficiency (chronic) (peripheral): Secondary | ICD-10-CM | POA: Insufficient documentation

## 2020-06-02 DIAGNOSIS — D72829 Elevated white blood cell count, unspecified: Secondary | ICD-10-CM | POA: Diagnosis not present

## 2020-06-02 DIAGNOSIS — F17210 Nicotine dependence, cigarettes, uncomplicated: Secondary | ICD-10-CM | POA: Diagnosis not present

## 2020-06-02 DIAGNOSIS — I890 Lymphedema, not elsewhere classified: Secondary | ICD-10-CM | POA: Diagnosis not present

## 2020-06-02 DIAGNOSIS — E039 Hypothyroidism, unspecified: Secondary | ICD-10-CM | POA: Insufficient documentation

## 2020-06-02 LAB — CBC WITH DIFFERENTIAL/PLATELET
Abs Immature Granulocytes: 0.03 10*3/uL (ref 0.00–0.07)
Basophils Absolute: 0 10*3/uL (ref 0.0–0.1)
Basophils Relative: 0 %
Eosinophils Absolute: 0.2 10*3/uL (ref 0.0–0.5)
Eosinophils Relative: 2 %
HCT: 40.9 % (ref 36.0–46.0)
Hemoglobin: 14.2 g/dL (ref 12.0–15.0)
Immature Granulocytes: 0 %
Lymphocytes Relative: 32 %
Lymphs Abs: 3.3 10*3/uL (ref 0.7–4.0)
MCH: 32.1 pg (ref 26.0–34.0)
MCHC: 34.7 g/dL (ref 30.0–36.0)
MCV: 92.3 fL (ref 80.0–100.0)
Monocytes Absolute: 0.6 10*3/uL (ref 0.1–1.0)
Monocytes Relative: 6 %
Neutro Abs: 6.1 10*3/uL (ref 1.7–7.7)
Neutrophils Relative %: 60 %
Platelets: 231 10*3/uL (ref 150–400)
RBC: 4.43 MIL/uL (ref 3.87–5.11)
RDW: 12.7 % (ref 11.5–15.5)
WBC: 10.3 10*3/uL (ref 4.0–10.5)
nRBC: 0 % (ref 0.0–0.2)

## 2020-06-02 LAB — FERRITIN: Ferritin: 77 ng/mL (ref 11–307)

## 2020-06-03 NOTE — Progress Notes (Signed)
[facility]  [address], [address] [place], [place] [address] Phone: [phone]  Fax: [phone]   Clinic Day:  06/06/2020  Referring physician: [name] *  Chief Complaint: Amanda Webb is a 49 y.o. female with iron deficiency and B12 deficiency who is seen for a 1 year assessment.  HPI: The patient was last seen in the hematology clinic for a telemedicine visit on 05/20/2019.  At that time, she felt good. She denied any melena or hematochezia. CBC was normal. Ferritin was 72. Surveillance continued.   She described pain and numbness radiating from her neck to her arm x 3 months.  Lumbar spine MRI on 08/09/2019 revealed no acute osseous injury of the lumbar spine. At L4-5 there is a mild broad-based disc bulge flattening the ventral thecal sac. Severe bilateral facet arthropathy with ligamentum flavum infolding. Bilateral lateral recess narrowing. There was mild spinal stenosis. No evidence of neural foraminal stenosis. At L5-S1 there is a broad-based disc bulge. Moderate left and mild right facet arthropathy. Mild left foraminal stenosis.  Cervical spine MRI on 0911/2020 revealed lower cervical disc bulging with mild to moderate C6 and C7 nerve level foraminal stenosis but no significant spinal stenosis. There was severe upper thoracic neural foraminal stenosis at the right T2 nerve level primarily due to facet arthropathy.  She was seen by Dr. Delana Meyer on 03/10/2020 for lymphedema and chronic venous insufficiency. Patient was directed to wear compression stocking during the day for swelling.   She saw Dr. Nicki Reaper MacDiarmid in urology on 04/25/2020. Patient had worsening incontinence. She had urge incontinence that can be high-volume.  She leaked a lot with coughing sneezing but only with bending lifting her bladder was full. No bedwetting. She can soak 3 pads a day. She voided every hour. She got up once or twice at night. Normal bowel  movements. Urinalysis showed a trace amount of RBCs. Urine culture showed mixed urogenital flora.   Labs on 06/02/2020 revealed hematocrit 40.9, hemoglobin 14.2, MCV 92.3, platelets 231,000, WBC 10,300 (ANC 6,100). Ferritin was 77.   During the interim, the patient has been doing well. Her energy level is "up and down". Denies bruising or bleeding. She has a good diet. She is eating iron rich foods. Denies ice pica. She notes weight gain. She reports some leg swelling. Patient noted some concern regarding a history of elevated white count. White count is 10,300 today. Patient is smoking 1 pack per day.  I noted smoking cause an elevated white count. She is trying to cut back. Her mother was recently diagnosed with lung cancer discovered on the low dose chest CT lung cancer screening program. Her mother is being cared for by Dr. Tasia Catchings.   Last monthly B12 injection was on 05/06/2020.   She received the [place] COVID-19 vaccine on 02/16/2020 and 03/18/2020.    Past Medical History:  Diagnosis Date  . Allergy   . Anemia   . Anxiety   . Arrhythmia   . Arthritis   . Cervical dysplasia   . Cystitis   . Depression   . GERD (gastroesophageal reflux disease)   . Gross hematuria   . Heart murmur   . HLD (hyperlipidemia)   . Hypertension   . Hypothyroid   . Pernicious anemia   . Tobacco abuse     Past Surgical History:  Procedure Laterality Date  . CARPAL TUNNEL RELEASE Bilateral 2011  . FOOT SURGERY Right 2013   Plantar fascia  . TONSILLECTOMY  1979  Family History  Problem Relation Age of Onset  . Depression Mother   . Mental illness Mother   . Alcohol abuse Mother   . Cancer Maternal Grandmother   . Diabetes Maternal Grandmother   . Ovarian cancer Maternal Grandmother   . Kidney disease Maternal Grandfather   . Diabetes Father   . Mental illness Father   . Depression Father   . Drug abuse Father   . Depression Paternal Grandfather   . Drug abuse Paternal Grandfather   .  Bladder Cancer Neg Hx   . Breast cancer Neg Hx     Social History:  reports that she has been smoking cigarettes. She has a 15.00 pack-year smoking history. She has never used smokeless tobacco. She reports current alcohol use. She reports that she does not use drugs. She smokes 1/2 pack/day x 30 years. Her mother was recently diagnosed with lung cancer and is undergoing radiation under the care of Dr. Tasia Catchings. Her mother was a part of the chest CT lung cancer screening program. Patient denies known exposures to radiation on toxins.  She is caring for her 78 year old grandson.  The patient is alone today.   Allergies:  Allergies  Allergen Reactions  . Bee Venom Anaphylaxis, Hives and Swelling    Carries Epi pen. Carries Epi pen.  . Cat Hair Extract Itching, Other (See Comments) and Swelling    Allergic to trees, nuts, wheat, grass, cats & dogs - itchy watery eyes, swelling. Uses Zyrtec & Flonase & Benadryl if really bad. Used to get allergy shots. Allergic to trees, nuts, wheat, grass, cats & dogs - itchy watery eyes, swelling. Uses Zyrtec & Flonase & Benadryl if really bad. Used to get allergy shots.  . Tetracycline     Other reaction(s): Unknown  . Tetracyclines & Related Nausea And Vomiting  . Lac Bovis Diarrhea and Nausea And Vomiting  . Lactose Diarrhea and Nausea And Vomiting  . Milk Protein Diarrhea and Nausea And Vomiting  . Tape Other (See Comments) and Rash    Needs to use paper tape. Breaks out with severe rash, pulls skin off when using adhesive.    Current Medications: Current Outpatient Medications  Medication Sig Dispense Refill  . Accu-Chek FastClix Lancets MISC USE TO CHECK SUGAR UP TO TWICE DAILY AS DIRECTED 102 each 5  . ACCU-CHEK GUIDE test strip CHECK BLOOD SUGAR UP TO TWICE DAILY AS DIRECTED 100 strip 5  . albuterol (VENTOLIN HFA) 108 (90 Base) MCG/ACT inhaler INHALE 1 TO 2 PUFFS BY MOUTH INTO THE LUNGS EVERY 4 TO 6 HOURS AS NEEDED FOR SHORTNESS OF BREATH 18 g 2  .  ARIPiprazole (ABILIFY) 2 MG tablet Take 1 tablet (2 mg total) by mouth daily.    Marland Kitchen atenolol (TENORMIN) 50 MG tablet Take 0.5 tablets (25 mg total) by mouth daily. 45 tablet 3  . Cetirizine HCl (ZYRTEC ALLERGY) 10 MG CAPS Take 1 capsule by mouth daily.     . ferrous sulfate 325 (65 FE) MG EC tablet 1 tablet (325mg ) BID to TID based on tolerance. TAKE WITH SOURCE OF VITAMIN C. 90 tablet 2  . fluticasone (FLONASE) 50 MCG/ACT nasal spray SHAKE LIQUID AND USE 2 SPRAYS IN EACH NOSTRIL EVERY DAY 48 g 1  . fluvoxaMINE (LUVOX) 100 MG tablet Take 150 mg by mouth 2 (two) times daily.    . furosemide (LASIX) 20 MG tablet TAKE 1 TABLET(20 MG) BY MOUTH DAILY AS NEEDED FOR SWELLING 90 tablet 0  . gabapentin (  NEURONTIN) 100 MG capsule Take 100-200 mg by mouth every 4 (four) hours as needed.    Marland Kitchen ketoconazole (NIZORAL) 2 % shampoo APPLY EXTERNALLY 2 TIMES A WEEK 120 mL 2  . lamoTRIgine (LAMICTAL) 200 MG tablet Take 200 mg by mouth daily.    Marland Kitchen levothyroxine (SYNTHROID) 50 MCG tablet TAKE 1 TABLET BY MOUTH EVERY MORNING 90 tablet 3  . medroxyPROGESTERone (DEPO-PROVERA) 150 MG/ML injection ADMINISTER 1 ML(150 MG) IN THE MUSCLE EVERY 3 MONTHS 1 mL 1  . pantoprazole (PROTONIX) 20 MG tablet TAKE 1 TABLET(20 MG) BY MOUTH DAILY 90 tablet 1  . QUEtiapine (SEROQUEL) 25 MG tablet 25 mg 2 (two) times daily.     . SUMAtriptan (IMITREX) 5 MG/ACT nasal spray Place 1 spray (5 mg total) every 2 (two) hours as needed into the nose for migraine. Max 3 doses in 24 hours 1 Inhaler 0  . tretinoin (RETIN-A) 0.025 % cream APPLY EXTERNALLY TO THE AFFECTED AREA AT BEDTIME AS NEEDED 45 g 2  . triamcinolone ointment (KENALOG) 0.1 % Apply topically 2 (two) times daily. 30 g 0  . medroxyPROGESTERone (DEPO-PROVERA) 150 MG/ML injection Inject 1 mL (150 mg total) into the muscle every 3 (three) months. 1 mL 1   No current facility-administered medications for this visit.   Facility-Administered Medications Ordered in Other Visits  Medication  Dose Route Frequency Provider Last Rate Last Admin  . cyanocobalamin ((VITAMIN B-12)) injection 1,000 mcg  1,000 mcg Intramuscular Once Karen Kitchens, NP        Review of Systems  Constitutional: Negative for diaphoresis, fever, malaise/fatigue and weight loss (up 11 lbs).       Doing well. Energy is "up and down".  HENT: Negative.  Negative for congestion, ear discharge, ear pain, nosebleeds, sinus pain and sore throat.   Eyes: Negative.  Negative for blurred vision, double vision, photophobia and pain.  Respiratory: Negative.  Negative for cough, hemoptysis, sputum production and shortness of breath.   Cardiovascular: Positive for leg swelling. Negative for chest pain, palpitations, orthopnea and PND.  Gastrointestinal: Negative.  Negative for abdominal pain, blood in stool, constipation, diarrhea, melena, nausea and vomiting.       No ice pica. Good diet.  Genitourinary: Negative for dysuria, frequency, hematuria and urgency.  Musculoskeletal: Negative for back pain, falls, joint pain (OA in hands, stable) and myalgias.  Skin: Negative for itching and rash.  Neurological: Negative for dizziness, tremors, focal weakness, weakness and headaches (chronic, stable).       Restless leg syndrome.  Endo/Heme/Allergies: Does not bruise/bleed easily.       HYPOthyroidism - on levothyroxine.  Psychiatric/Behavioral: Negative for depression, memory loss and suicidal ideas. The patient is not nervous/anxious and does not have insomnia (poor sleep, improved).   All other systems reviewed and are negative.   Performance status (ECOG):  0  Vitals: Blood pressure (!) 162/81, pulse 65, temperature 97.9 F (36.6 C), temperature source Tympanic, resp. rate 16, weight 272 lb 6.1 oz (123.5 kg), SpO2 98 %.  Physical Exam Nursing note reviewed.  Constitutional:      General: She is not in acute distress.    Appearance: Normal appearance. She is well-developed. She is not diaphoretic.     Interventions:  Face mask in place.  HENT:     Head: Normocephalic and atraumatic.     Comments: Short styled blonde hair.    Mouth/Throat:     Mouth: Mucous membranes are moist.     Pharynx: Oropharynx is clear.  Eyes:     General: No scleral icterus.    Extraocular Movements: Extraocular movements intact.     Conjunctiva/sclera: Conjunctivae normal.     Pupils: Pupils are equal, round, and reactive to light.     Comments: Blue eyes.  Cardiovascular:     Rate and Rhythm: Normal rate and regular rhythm.     Pulses: Normal pulses.     Heart sounds: Normal heart sounds. No murmur heard.   Pulmonary:     Effort: Pulmonary effort is normal. No respiratory distress.     Breath sounds: Normal breath sounds. No wheezing or rales.  Chest:     Chest wall: No tenderness.  Abdominal:     General: Bowel sounds are normal. There is no distension.     Palpations: Abdomen is soft. There is no mass.     Tenderness: There is no abdominal tenderness. There is no guarding.  Musculoskeletal:        General: No swelling or tenderness. Normal range of motion.  Lymphadenopathy:     Head:     Right side of head: No preauricular, posterior auricular or occipital adenopathy.     Left side of head: No preauricular, posterior auricular or occipital adenopathy.     Cervical: No cervical adenopathy.     Upper Body:     Right upper body: No supraclavicular adenopathy.     Left upper body: No supraclavicular adenopathy.     Lower Body: No right inguinal adenopathy. No left inguinal adenopathy.  Skin:    General: Skin is warm and dry.  Neurological:     Mental Status: She is alert and oriented to person, place, and time. Mental status is at baseline.  Psychiatric:        Mood and Affect: Mood normal.        Behavior: Behavior normal.        Thought Content: Thought content normal.        Judgment: Judgment normal.     No visits with results within 3 Day(s) from this visit.  Latest known visit with results is:    Appointment on 06/02/2020  Component Date Value Ref Range Status  . Ferritin 06/02/2020 77  11 - 307 ng/mL Final   Performed at [facility], [address] [place], [place] [address]  . WBC 06/02/2020 10.3  4.0 - 10.5 K/uL Final  . RBC 06/02/2020 4.43  3.87 - 5.11 MIL/uL Final  . Hemoglobin 06/02/2020 14.2  12.0 - 15.0 g/dL Final  . HCT 06/02/2020 40.9  36 - 46 % Final  . MCV 06/02/2020 92.3  80.0 - 100.0 fL Final  . MCH 06/02/2020 32.1  26.0 - 34.0 pg Final  . MCHC 06/02/2020 34.7  30.0 - 36.0 g/dL Final  . RDW 06/02/2020 12.7  11.5 - 15.5 % Final  . Platelets 06/02/2020 231  150 - 400 K/uL Final  . nRBC 06/02/2020 0.0  0.0 - 0.2 % Final  . Neutrophils Relative % 06/02/2020 60  % Final  . Neutro Abs 06/02/2020 6.1  1.7 - 7.7 K/uL Final  . Lymphocytes Relative 06/02/2020 32  % Final  . Lymphs Abs 06/02/2020 3.3  0.7 - 4.0 K/uL Final  . Monocytes Relative 06/02/2020 6  % Final  . Monocytes Absolute 06/02/2020 0.6  0 - 1 K/uL Final  . Eosinophils Relative 06/02/2020 2  % Final  . Eosinophils Absolute 06/02/2020 0.2  0 - 0 K/uL Final  . Basophils Relative 06/02/2020 0  %  Final  . Basophils Absolute 06/02/2020 0.0  0 - 0 K/uL Final  . Immature Granulocytes 06/02/2020 0  % Final  . Abs Immature Granulocytes 06/02/2020 0.03  0.00 - 0.07 K/uL Final   Performed at [facility], [address] [place], [place] [address]    Assessment:  Trenita Hulme is a 49 y.o. female with a mild normocytic anemia.  She has iron deficiency.  Hemoglobin was normal until 03/12/2018.  She notes scant vaginal bleeding on DepoProvera.  She denies any hematuria, melena or hematochezia.  Diet appears good.  She has ice pica.  CBC on 03/12/2018 revealed a hematocrit of 32.7, hemoglobin 11.2, and MCV 82.4.  Normal studies included:  Creatinine, LFTs, TSH and free T4.  Ferritin was 11.  Iron saturation was 8% and TIBC 362.  Work-up on 03/24/2018 revealed a hematocrit of 35.7, hemoglobin  11.9, and MCV 84.2.  Folate was 17.2.  B12 was 296 (low normal).  MMA was normal.  Intrinsic factor antibody and antiparietal cell antibody was normal on 06/12/2018.  Retic was 1.3%.  Guaiac cards were negative x 3.  She began oral B12 on 04/13/2018.  B12 was 502 on 06/12/2018 and 327 on 11/21/2018. Began weekly B12 injections x 6 on 11/28/2018 (last 05/06/2020).  Folate was 17.2 on 03/24/2018.  Ferritin has been followed: 8 on 04/28/2012, 20 on 09/03/2012, 18 on 09/28/2013, 11 on 03/12/2018, 38 on 06/11/2018, 58 on 07/24/2018, 73 on 11/21/2018, 92 on 02/12/2019, 72 on 05/19/2019, and 77 on 06/02/2020.  Oral iron was discontinued on 02/13/2019.  PSG testing on 08/01/2018 at [facility]. Study not consistent with OSAH syndrome, however there was significant evidence of RLS. Patient with elevated periodic limb movement index of 76.7 movements per hour, which provoked arousals and awakenings. Recommendations included weight loss and formal treatment of patient's restless leg symptoms.  She received the [place] COVID-19 vaccine on 02/16/2020 and 03/18/2020.   Symptomatically, she feels well.  She denies pica.  Hemoglobin is 14.2.  WBC is 10,300.  Plan: 1.   Review labs from 06/02/2020. 2.   Iron deficiency anemia   Hematocrit 40.9.  Hemoglobin 14.2.  MCV 92.3. Ferritin 77.   She denies any bleeding. Continue surveillance. 3. B12 deficiency  B12 today and monthly x 6 for B12 (last 05/06/2020).  Check folate yearly. 4.   Leukocytosis   Patient notes a history of leukocytosis. .  WBC today is 10,300 (normal).    Differential is predominantly neutrophils.    Suspect etiology is reactive secondary to smoking.  Encourage smoking cessation. 5.   Smoking history  RN to contact Melinda Crutch about qualification for the low-dose chest CT program (approximately 30-pack-year)  6.   RTC in 6 months for labs (CBC with diff, ferritin, folate) and B12 injection.   7.   RTC in 12 months for MD  assessment, labs (CBC with diff, ferritin) and B12 injection.    I discussed the assessment and treatment plan with the patient.  The patient was provided an opportunity to ask questions and all were answered.  The patient agreed with the plan and demonstrated an understanding of the instructions.  The patient was advised to call back or seek an in person evaluation if the symptoms worsen or if the condition fails to improve as anticipated.   Nolon Stalls, MD, PhD  06/06/2020, 2:54 PM  I, Selena Batten, am acting as scribe for [employer]. Mike Gip, MD, PhD.  I, Geovany Trudo C. Mike Gip, MD,  have reviewed the above documentation for accuracy and completeness, and I agree with the above.

## 2020-06-06 ENCOUNTER — Other Ambulatory Visit: Payer: Self-pay

## 2020-06-06 ENCOUNTER — Inpatient Hospital Stay (HOSPITAL_BASED_OUTPATIENT_CLINIC_OR_DEPARTMENT_OTHER): Payer: Medicare Other | Admitting: Hematology and Oncology

## 2020-06-06 ENCOUNTER — Encounter: Payer: Self-pay | Admitting: Hematology and Oncology

## 2020-06-06 ENCOUNTER — Ambulatory Visit: Payer: Medicare Other | Admitting: Urology

## 2020-06-06 ENCOUNTER — Other Ambulatory Visit: Payer: Medicare Other

## 2020-06-06 ENCOUNTER — Encounter: Payer: Self-pay | Admitting: Urology

## 2020-06-06 ENCOUNTER — Inpatient Hospital Stay: Payer: Medicare Other

## 2020-06-06 VITALS — BP 162/81 | HR 65 | Temp 97.9°F | Resp 16 | Wt 272.4 lb

## 2020-06-06 DIAGNOSIS — I890 Lymphedema, not elsewhere classified: Secondary | ICD-10-CM | POA: Diagnosis not present

## 2020-06-06 DIAGNOSIS — E538 Deficiency of other specified B group vitamins: Secondary | ICD-10-CM | POA: Diagnosis not present

## 2020-06-06 DIAGNOSIS — F17210 Nicotine dependence, cigarettes, uncomplicated: Secondary | ICD-10-CM | POA: Diagnosis not present

## 2020-06-06 DIAGNOSIS — D509 Iron deficiency anemia, unspecified: Secondary | ICD-10-CM

## 2020-06-06 DIAGNOSIS — D72829 Elevated white blood cell count, unspecified: Secondary | ICD-10-CM | POA: Diagnosis not present

## 2020-06-06 DIAGNOSIS — I872 Venous insufficiency (chronic) (peripheral): Secondary | ICD-10-CM | POA: Diagnosis not present

## 2020-06-06 MED ORDER — CYANOCOBALAMIN 1000 MCG/ML IJ SOLN
1000.0000 ug | Freq: Once | INTRAMUSCULAR | Status: AC
  Administered 2020-06-06: 1000 ug via INTRAMUSCULAR

## 2020-06-06 NOTE — Progress Notes (Signed)
Patient here for oncology follow-up appointment, expresses no complaints or concerns at this time.    

## 2020-06-08 ENCOUNTER — Other Ambulatory Visit: Payer: Self-pay | Admitting: Family Medicine

## 2020-06-08 DIAGNOSIS — J309 Allergic rhinitis, unspecified: Secondary | ICD-10-CM

## 2020-06-08 NOTE — Telephone Encounter (Signed)
Requested Prescriptions  Pending Prescriptions Disp Refills  . fluticasone (FLONASE) 50 MCG/ACT nasal spray [Pharmacy Med Name: FLUTICASONE 50MCG NASAL SP (120) RX] 48 g 1    Sig: SHAKE LIQUID AND USE 2 SPRAYS IN EACH NOSTRIL EVERY DAY     Ear, Nose, and Throat: Nasal Preparations - Corticosteroids Passed - 06/08/2020  2:06 PM      Passed - Valid encounter within last 12 months    Recent Outpatient Visits          3 months ago Bilateral lower extremity edema   [place], DO   5 months ago Pain of finger of left hand   [place], DO   9 months ago Acute non-recurrent pansinusitis   [facility] Merrilyn Puma, Jerrel Ivory, NP   1 year ago Essential hypertension   [place], DO   1 year ago Acute bronchitis with bronchospasm   [place], DO      Future Appointments            In 3 months Amalia Hailey, Nyoka Lint, MD [facility]

## 2020-06-13 ENCOUNTER — Ambulatory Visit (INDEPENDENT_AMBULATORY_CARE_PROVIDER_SITE_OTHER): Payer: Medicare Other | Admitting: Vascular Surgery

## 2020-06-13 ENCOUNTER — Ambulatory Visit (INDEPENDENT_AMBULATORY_CARE_PROVIDER_SITE_OTHER): Payer: Medicare Other | Admitting: Family Medicine

## 2020-06-13 ENCOUNTER — Ambulatory Visit (INDEPENDENT_AMBULATORY_CARE_PROVIDER_SITE_OTHER): Payer: Medicare Other

## 2020-06-13 ENCOUNTER — Encounter: Payer: Self-pay | Admitting: Family Medicine

## 2020-06-13 ENCOUNTER — Encounter (INDEPENDENT_AMBULATORY_CARE_PROVIDER_SITE_OTHER): Payer: Self-pay

## 2020-06-13 ENCOUNTER — Other Ambulatory Visit: Payer: Self-pay

## 2020-06-13 VITALS — BP 120/62 | HR 86 | Temp 98.2°F | Resp 16 | Ht 67.0 in | Wt 272.6 lb

## 2020-06-13 DIAGNOSIS — R1084 Generalized abdominal pain: Secondary | ICD-10-CM | POA: Diagnosis not present

## 2020-06-13 DIAGNOSIS — R1011 Right upper quadrant pain: Secondary | ICD-10-CM

## 2020-06-13 MED ORDER — DICYCLOMINE HCL 10 MG PO CAPS: 10.0000 mg | ORAL_CAPSULE | Freq: Three times a day (TID) | ORAL | 2 refills | Status: DC

## 2020-06-13 NOTE — Patient Instructions (Addendum)
Thank you for coming to the office today.  Will order complete Abdominal Ultrasound - last done about 5 years ago.  We can check Gallbladder and order this at hospital and they will schedule it and we can review results.  May need to refer to Gen Surgeon if this is gallbladder related.  Dicyclomine trial as needed for abdominal pain and cramping before or after each meal or bedtime  Please schedule a Follow-up Appointment to: Return if symptoms worsen or fail to improve, for abdominal pain.  If you have any other questions or concerns, please feel free to call the office or send a message through [facility]. You may also schedule an earlier appointment if necessary.  Additionally, you may be receiving a survey about your experience at our office within a few days to 1 week by e-mail or mail. We value your feedback.  Amanda Putnam, DO [facility]   Gallbladder Eating Plan If you have a gallbladder condition, you may have trouble digesting fats. Eating a low-fat diet can help reduce your symptoms, and may be helpful before and after having surgery to remove your gallbladder (cholecystectomy). Your health care provider may recommend that you work with a diet and nutrition specialist (dietitian) to help you reduce the amount of fat in your diet. What are tips for following this plan? General guidelines  Limit your fat intake to less than 30% of your total daily calories. If you eat around 1,800 calories each day, this is less than 60 grams (g) of fat per day.  Fat is an important part of a healthy diet. Eating a low-fat diet can make it hard to maintain a healthy body weight. Ask your dietitian how much fat, calories, and other nutrients you need each day.  Eat small, frequent meals throughout the day instead of three large meals.  Drink at least 8-10 cups of fluid a day. Drink enough fluid to keep your urine clear or pale yellow.  Limit alcohol intake to no  more than 1 drink a day for nonpregnant women and 2 drinks a day for men. One drink equals 12 oz of beer, 5 oz of wine, or 1 oz of hard liquor. Reading food labels  Check Nutrition Facts on food labels for the amount of fat per serving. Choose foods with less than 3 grams of fat per serving. Shopping  Choose nonfat and low-fat healthy foods. Look for the words "nonfat," "low fat," or "fat free."  Avoid buying processed or prepackaged foods. Cooking  Cook using low-fat methods, such as baking, broiling, grilling, or boiling.  Cook with small amounts of healthy fats, such as olive oil, grapeseed oil, canola oil, or sunflower oil. What foods are recommended?   All fresh, frozen, or canned fruits and vegetables.  Whole grains.  Low-fat or non-fat (skim) milk and yogurt.  Lean meat, skinless poultry, fish, eggs, and beans.  Low-fat protein supplement powders or drinks.  Spices and herbs. What foods are not recommended?  High-fat foods. These include baked goods, fast food, fatty cuts of meat, ice cream, french toast, sweet rolls, pizza, cheese bread, foods covered with butter, creamy sauces, or cheese.  Fried foods. These include french fries, tempura, battered fish, breaded chicken, fried breads, and sweets.  Foods with strong odors.  Foods that cause bloating and gas. Summary  A low-fat diet can be helpful if you have a gallbladder condition, or before and after gallbladder surgery.  Limit your fat intake to less than  less than 30% of your total daily calories. This is about 60 g of fat if you eat 1,800 calories each day.  Eat small, frequent meals throughout the day instead of three large meals. This information is not intended to replace advice given to you by your health care provider. Make sure you discuss any questions you have with your health care provider. Document Revised: 03/26/2019 Document Reviewed: 01/10/2017 Elsevier Patient Education  2020 [employer]  

## 2020-06-13 NOTE — Progress Notes (Signed)
Subjective:    Patient ID: Amanda Webb, female    DOB: 18-Mar-1971, 49 y.o.   MRN: [id number]  Amanda Webb is a 49 y.o. female presenting on 06/13/2020 for Abdominal Pain (onset 2 month RUQ)  Patient presents for a same day appointment.  HPI   RUQ Abdominal Pain Generalized Abdominal Pain Abdominal Cramping Reports issue now for past 2 months with worse RUQ episodic abdominal pain, worse after larger fatty fried meals and also has some episodic symptoms of more generalized abdominal discomfort. She admits some med changes to psych meds coming off seroquel and onto gabapentin and has had some weight issues and weight gain as well. She has known gallstones for 20+ years is what is reported today last imaging see below abdominal US 2016 showed gallstones, has not had any procedure, still has gallbladder. - Additionally she says she has abdominal wall discomfort if pressure on the abdomen or if leaning over something will cause pain, and at night her child can sometimes kick and may cause injury to her abdomen at times or while holding him or if he is playing and can put pressure on this area - Admits some loose stools Denies any dark stool, blood in stool, nausea vomiting, loss of appetite   Depression screen [facility] 2/9 12/30/2019 07/07/2019 01/01/2019  Decreased Interest 0 1 0  Down, Depressed, Hopeless 0 1 0  PHQ - 2 Score 0 2 0  Altered sleeping - 3 3  Tired, decreased energy - 3 0  Change in appetite - 0 0  Feeling bad or failure about yourself  - 3 0  Trouble concentrating - 0 0  Moving slowly or fidgety/restless - 0 0  Suicidal thoughts - 0 0  PHQ-9 Score - 11 3  Difficult doing work/chores - Somewhat difficult Not difficult at all  Some recent data might be hidden    Social History   Tobacco Use  . Smoking status: Current Every Day Smoker    Packs/day: 0.50    Years: 30.00    Pack years: 15.00    Types: Cigarettes  . Smokeless tobacco: Current User  Vaping  Use  . Vaping Use: Never used  Substance Use Topics  . Alcohol use: Yes  . Drug use: No    Review of Systems Per HPI unless specifically indicated above     Objective:    BP 120/62   Pulse 86   Temp 98.2 F (36.8 C) (Temporal)   Resp 16   Ht 5\' 7"  (1.702 m)   Wt 272 lb 9.6 oz (123.7 kg)   SpO2 96%   BMI 42.70 kg/m   Wt Readings from Last 3 Encounters:  06/13/20 272 lb 9.6 oz (123.7 kg)  06/06/20 272 lb 6.1 oz (123.5 kg)  04/25/20 275 lb (124.7 kg)    Physical Exam Vitals and nursing note reviewed.  Constitutional:      General: She is not in acute distress.    Appearance: She is well-developed. She is obese. She is not diaphoretic.     Comments: Well-appearing, comfortable, cooperative  HENT:     Head: Normocephalic and atraumatic.  Eyes:     General:        Right eye: No discharge.        Left eye: No discharge.     Conjunctiva/sclera: Conjunctivae normal.  Cardiovascular:     Rate and Rhythm: Normal rate.  Pulmonary:     Effort: Pulmonary effort is normal.  Abdominal:  General: Abdomen is protuberant. Bowel sounds are decreased. There is no distension.     Palpations: Abdomen is soft. There is no hepatomegaly or mass.     Tenderness: There is abdominal tenderness in the right upper quadrant and left upper quadrant. There is no right CVA tenderness, left CVA tenderness, guarding or rebound. Positive signs include Murphy's sign. Negative signs include McBurney's sign.     Hernia: There is no hernia in the ventral area.  Skin:    General: Skin is warm and dry.     Findings: No erythema or rash.  Neurological:     Mental Status: She is alert and oriented to person, place, and time.  Psychiatric:        Behavior: Behavior normal.     Comments: Well groomed, good eye contact, normal speech and thoughts      CLINICAL DATA:  Right upper quadrant pain.  Diarrhea.  EXAM: ULTRASOUND ABDOMEN COMPLETE  COMPARISON:  CT 09/28/2015.  FINDINGS: Gallbladder:  Gallstones. Gallbladder wall is thickened at 3.4 mm. Cholecystitis cannot be excluded .  Common bile duct: Diameter: 4.0 mm  Liver: Liver is echogenic consistent fatty infiltration and/or hepatocellular disease. No focal hepatic abnormality identified .  IVC: No abnormality visualized.  Pancreas: Visualized portion unremarkable.  Spleen: Size and appearance within normal limits.  Right Kidney: Length: 11.9 cm. Echogenicity within normal limits. No mass or hydronephrosis visualized.  Left Kidney: Length: 11.4 cm. Echogenicity within normal limits. No mass or hydronephrosis visualized. Previously identified simple cysts not visualized due to body habitus.  Abdominal aorta: No aneurysm visualized.  Other findings: Exam limited by body habitus and bowel gas.  IMPRESSION: 1. Multiple gallstones. Gallbladder wall is thickened suggesting the possibility cholecystitis. No biliary obstruction.  2. Echodense liver consistent fatty infiltration and/or hepatocellular disease.   Electronically Signed   By: Marcello Moores  Register   On: 10/21/2015 11:23  Results for orders placed or performed in visit on 06/02/20  Ferritin  Result Value Ref Range   Ferritin 77 11 - 307 ng/mL  CBC with Differential/Platelet  Result Value Ref Range   WBC 10.3 4.0 - 10.5 K/uL   RBC 4.43 3.87 - 5.11 MIL/uL   Hemoglobin 14.2 12.0 - 15.0 g/dL   HCT 40.9 36 - 46 %   MCV 92.3 80.0 - 100.0 fL   MCH 32.1 26.0 - 34.0 pg   MCHC 34.7 30.0 - 36.0 g/dL   RDW 12.7 11.5 - 15.5 %   Platelets 231 150 - 400 K/uL   nRBC 0.0 0.0 - 0.2 %   Neutrophils Relative % 60 %   Neutro Abs 6.1 1.7 - 7.7 K/uL   Lymphocytes Relative 32 %   Lymphs Abs 3.3 0.7 - 4.0 K/uL   Monocytes Relative 6 %   Monocytes Absolute 0.6 0 - 1 K/uL   Eosinophils Relative 2 %   Eosinophils Absolute 0.2 0 - 0 K/uL   Basophils Relative 0 %   Basophils Absolute 0.0 0 - 0 K/uL   Immature Granulocytes 0 %   Abs Immature Granulocytes 0.03  0.00 - 0.07 K/uL      Assessment & Plan:   Problem List Items Addressed This Visit    None    Visit Diagnoses    Colicky RUQ abdominal pain    -  Primary   Relevant Orders   US Abdomen Complete   Generalized abdominal pain       Relevant Medications   dicyclomine (BENTYL) 10 MG capsule  Other Relevant Orders   US Abdomen Complete      Subacute on chronic RUQ Abdominal Pain Has some generalized abdominal pain as well bilateral symptoms, provoked at times with pressure Has lower Gi symptoms as well with cramping and bloating, worse with weight gain Known history gallstones, prior biliary colick, worse after fatty fried larger meals now still Last [place] reviewed 2016  Discussion today, will repeat Complete Abdominal US for RUQ gallbladder and rest of abdomen to evaluate her generalized pain Trial Bentyl Dicyclomine PRN cramping pain trial Improve diet, handout gallbladder diet given Encourage weight loss, may improve with medication changes per psych Also concern some physical MSK causes of pain with activities bending pressure on abdomen or child playing physically can cause pain or symptoms if repetitive injury or contact.  Follow up US results, may refer to Gen Surgery if indicated.  Orders Placed This Encounter  Procedures  . US Abdomen Complete    Standing Status:   Future    Standing Expiration Date:   06/13/2021    Order Specific Question:   Reason for Exam (SYMPTOM  OR DIAGNOSIS REQUIRED)    Answer:   chronic >2 months abdominal pain generalized R worse than Left, has known RUQ colicky abdominal pain with gallstones on last [place] in 2016, has generalized pain however, worse after eating    Order Specific Question:   Preferred imaging location?    Answer:   [place] ordered this encounter  Medications  . dicyclomine (BENTYL) 10 MG capsule    Sig: Take 1 capsule (10 mg total) by mouth 4 (four) times daily -  before meals and at bedtime. As needed only  for abdominal cramping and pain    Dispense:  30 capsule    Refill:  2      Follow up plan: Return if symptoms worsen or fail to improve, for abdominal pain.   [name], [facility] 06/13/2020, 4:35 PM

## 2020-06-14 ENCOUNTER — Ambulatory Visit: Payer: Medicare Other | Admitting: Family Medicine

## 2020-06-17 DIAGNOSIS — J0100 Acute maxillary sinusitis, unspecified: Secondary | ICD-10-CM

## 2020-06-21 MED ORDER — AMOXICILLIN-POT CLAVULANATE 875-125 MG PO TABS: 1.0000 | ORAL_TABLET | Freq: Two times a day (BID) | ORAL | 0 refills | Status: DC

## 2020-06-21 MED ORDER — IPRATROPIUM BROMIDE 0.06 % NA SOLN: 2.0000 | Freq: Four times a day (QID) | NASAL | 0 refills | Status: DC

## 2020-06-21 NOTE — Addendum Note (Signed)
Addended by: Olin Hauser on: 06/21/2020 06:06 PM   Modules accepted: Orders

## 2020-06-22 ENCOUNTER — Other Ambulatory Visit: Payer: Self-pay

## 2020-06-22 ENCOUNTER — Ambulatory Visit
Admission: RE | Admit: 2020-06-22 | Discharge: 2020-06-22 | Disposition: A | Discharge status: Home / Self Care | Payer: Medicare Other | Source: Ambulatory Visit | Attending: Family Medicine | Admitting: Family Medicine

## 2020-06-22 DIAGNOSIS — R1011 Right upper quadrant pain: Secondary | ICD-10-CM | POA: Diagnosis not present

## 2020-06-22 DIAGNOSIS — R1084 Generalized abdominal pain: Secondary | ICD-10-CM

## 2020-06-22 DIAGNOSIS — K8020 Calculus of gallbladder without cholecystitis without obstruction: Secondary | ICD-10-CM

## 2020-06-23 ENCOUNTER — Encounter: Payer: Self-pay | Admitting: Family Medicine

## 2020-06-23 DIAGNOSIS — K760 Fatty (change of) liver, not elsewhere classified: Secondary | ICD-10-CM | POA: Insufficient documentation

## 2020-06-23 NOTE — Addendum Note (Signed)
Addended by: Olin Hauser on: 06/23/2020 02:06 PM   Modules accepted: Orders

## 2020-07-05 ENCOUNTER — Ambulatory Visit (INDEPENDENT_AMBULATORY_CARE_PROVIDER_SITE_OTHER): Payer: Medicare Other | Admitting: Surgery

## 2020-07-05 ENCOUNTER — Encounter: Payer: Medicare Other | Admitting: Obstetrics and Gynecology

## 2020-07-05 ENCOUNTER — Telehealth: Payer: Self-pay | Admitting: Surgery

## 2020-07-05 ENCOUNTER — Other Ambulatory Visit: Payer: Self-pay

## 2020-07-05 ENCOUNTER — Ambulatory Visit: Payer: Self-pay | Admitting: Surgery

## 2020-07-05 ENCOUNTER — Encounter: Payer: Self-pay | Admitting: Surgery

## 2020-07-05 DIAGNOSIS — K8010 Calculus of gallbladder with chronic cholecystitis without obstruction: Secondary | ICD-10-CM

## 2020-07-05 NOTE — Patient Instructions (Addendum)
Our surgery scheduler Pamala Hurry will contact you within the next 24-48 hours. During that call, she will discuss the preparation prior to surgery and also discuss the different dates or times for surgery. Please have the BLUE sheet available when she contacts you. If you have any question regarding surgery, please do not hesitate to give our office a call. Dr.Rodenberg discussed the surgery and the risk factors that come along with this surgery type with patient today.   Cholelithiasis  Cholelithiasis is a form of gallbladder disease in which gallstones form in the gallbladder. The gallbladder is an organ that stores bile. Bile is made in the liver, and it helps to digest fats. Gallstones begin as small crystals and slowly grow into stones. They may cause no symptoms until the gallbladder tightens (contracts) and a gallstone is blocking the duct (gallbladder attack), which can cause pain. Cholelithiasis is also referred to as gallstones. There are two main types of gallstones:  Cholesterol stones. These are made of hardened cholesterol and are usually yellow-green in color. They are the most common type of gallstone. Cholesterol is a white, waxy, fat-like substance that is made in the liver.  Pigment stones. These are dark in color and are made of a red-yellow substance that forms when hemoglobin from red blood cells breaks down (bilirubin). What are the causes? This condition may be caused by an imbalance in the substances that bile is made of. This can happen if the bile:  Has too much bilirubin.  Has too much cholesterol.  Does not have enough bile salts. These salts help the body absorb and digest fats. In some cases, this condition can also be caused by the gallbladder not emptying completely or often enough. What increases the risk? The following factors may make you more likely to develop this condition:  Being female.  Having multiple pregnancies. Health care providers sometimes advise  removing diseased gallbladders before future pregnancies.  Eating a diet that is heavy in fried foods, fat, and refined carbohydrates, like white bread and white rice.  Being obese.  Being older than age 29.  Prolonged use of medicines that contain female hormones (estrogen).  Having diabetes mellitus.  Rapidly losing weight.  Having a family history of gallstones.  Being of [place] or [place] descent.  Having an intestinal disease such as Crohn disease.  Having metabolic syndrome.  Having cirrhosis.  Having severe types of anemia such as sickle cell anemia. What are the signs or symptoms? In most cases, there are no symptoms. These are known as silent gallstones. If a gallstone blocks the bile ducts, it can cause a gallbladder attack. The main symptom of a gallbladder attack is sudden pain in the upper right abdomen. The pain usually comes at night or after eating a large meal. The pain can last for one or several hours and can spread to the right shoulder or chest. If the bile duct is blocked for more than a few hours, it can cause infection or inflammation of the gallbladder, liver, or pancreas, which may cause:  Nausea.  Vomiting.  Abdominal pain that lasts for 5 hours or more.  Fever or chills.  Yellowing of the skin or the whites of the eyes (jaundice).  Dark urine.  Light-colored stools. How is this diagnosed? This condition may be diagnosed based on:  A physical exam.  Your medical history.  An ultrasound of your gallbladder.  CT scan.  MRI.  Blood tests to check for signs of infection or inflammation.  A scan of your gallbladder and bile ducts (biliary system) using nonharmful radioactive material and special cameras that can see the radioactive material (cholescintigram). This test checks to see how your gallbladder contracts and whether bile ducts are blocked.  Inserting a small tube with a camera on the end (endoscope) through your mouth  to inspect bile ducts and check for blockages (endoscopic retrograde cholangiopancreatogram). How is this treated? Treatment for gallstones depends on the severity of the condition. Silent gallstones do not need treatment. If the gallstones cause a gallbladder attack or other symptoms, treatment may be required. Options for treatment include:  Surgery to remove the gallbladder (cholecystectomy). This is the most common treatment.  Medicines to dissolve gallstones. These are most effective at treating small gallstones. You may need to take medicines for up to 6-12 months.  Shock wave treatment (extracorporeal biliary lithotripsy). In this treatment, an ultrasound machine sends shock waves to the gallbladder to break gallstones into smaller pieces. These pieces can then be passed into the intestines or be dissolved by medicine. This is rarely used.  Removing gallstones through endoscopic retrograde cholangiopancreatogram. A small basket can be attached to the endoscope and used to capture and remove gallstones. Follow these instructions at home:  Take over-the-counter and prescription medicines only as told by your health care provider.  Maintain a healthy weight and follow a healthy diet. This includes: ? Reducing fatty foods, such as fried food. ? Reducing refined carbohydrates, like white bread and white rice. ? Increasing fiber. Aim for foods like almonds, fruit, and beans.  Keep all follow-up visits as told by your health care provider. This is important. Contact a health care provider if:  You think you have had a gallbladder attack.  You have been diagnosed with silent gallstones and you develop abdominal pain or indigestion. Get help right away if:  You have pain from a gallbladder attack that lasts for more than 2 hours.  You have abdominal pain that lasts for more than 5 hours.  You have a fever or chills.  You have persistent nausea and vomiting.  You develop  jaundice.  You have dark urine or light-colored stools. Summary  Cholelithiasis (also called gallstones) is a form of gallbladder disease in which gallstones form in the gallbladder.  This condition is caused by an imbalance in the substances that make up bile. This can happen if the bile has too much cholesterol, too much bilirubin, or not enough bile salts.  You are more likely to develop this condition if you are female, pregnant, using medicines with estrogen, obese, older than age 54, or have a family history of gallstones. You may also develop gallstones if you have diabetes, an intestinal disease, cirrhosis, or metabolic syndrome.  Treatment for gallstones depends on the severity of the condition. Silent gallstones do not need treatment.  If gallstones cause a gallbladder attack or other symptoms, treatment may be needed. The most common treatment is surgery to remove the gallbladder. This information is not intended to replace advice given to you by your health care provider. Make sure you discuss any questions you have with your health care provider. Document Revised: 11/15/2017 Document Reviewed: 08/19/2016 Elsevier Patient Education  [place] If you have a gallbladder condition, you may have trouble digesting fats. Eating a low-fat diet can help reduce your symptoms, and may be helpful before and after having surgery to remove your gallbladder (cholecystectomy). Your health care provider may recommend that you work  with a diet and nutrition specialist (dietitian) to help you reduce the amount of fat in your diet. What are tips for following this plan? General guidelines  Limit your fat intake to less than 30% of your total daily calories. If you eat around 1,800 calories each day, this is less than 60 grams (g) of fat per day.  Fat is an important part of a healthy diet. Eating a low-fat diet can make it hard to maintain a healthy body weight.  Ask your dietitian how much fat, calories, and other nutrients you need each day.  Eat small, frequent meals throughout the day instead of three large meals.  Drink at least 8-10 cups of fluid a day. Drink enough fluid to keep your urine clear or pale yellow.  Limit alcohol intake to no more than 1 drink a day for nonpregnant women and 2 drinks a day for men. One drink equals 12 oz of beer, 5 oz of wine, or 1 oz of hard liquor. Reading food labels  Check Nutrition Facts on food labels for the amount of fat per serving. Choose foods with less than 3 grams of fat per serving. Shopping  Choose nonfat and low-fat healthy foods. Look for the words "nonfat," "low fat," or "fat free."  Avoid buying processed or prepackaged foods. Cooking  Cook using low-fat methods, such as baking, broiling, grilling, or boiling.  Cook with small amounts of healthy fats, such as olive oil, grapeseed oil, canola oil, or sunflower oil. What foods are recommended?   All fresh, frozen, or canned fruits and vegetables.  Whole grains.  Low-fat or non-fat (skim) milk and yogurt.  Lean meat, skinless poultry, fish, eggs, and beans.  Low-fat protein supplement powders or drinks.  Spices and herbs. What foods are not recommended?  High-fat foods. These include baked goods, fast food, fatty cuts of meat, ice cream, french toast, sweet rolls, pizza, cheese bread, foods covered with butter, creamy sauces, or cheese.  Fried foods. These include french fries, tempura, battered fish, breaded chicken, fried breads, and sweets.  Foods with strong odors.  Foods that cause bloating and gas. Summary  A low-fat diet can be helpful if you have a gallbladder condition, or before and after gallbladder surgery.  Limit your fat intake to less than 30% of your total daily calories. This is about 60 g of fat if you eat 1,800 calories each day.  Eat small, frequent meals throughout the day instead of three large  meals. This information is not intended to replace advice given to you by your health care provider. Make sure you discuss any questions you have with your health care provider. Document Revised: 03/26/2019 Document Reviewed: 01/10/2017 Elsevier Patient Education  [place].

## 2020-07-05 NOTE — Progress Notes (Signed)
Patient ID: Amanda Webb, female   DOB: 1971/02/14, 49 y.o.   MRN: [id number]  Chief Complaint: Gallstones  History of Present Illness Amanda Webb is a 49 y.o. female with 20+ year history of having multiple attacks over the years.  Reports having prior ED visits which she had the pain cocktail without improvement.  She reports the pain has also been dull and nagging, located primarily in the right upper quadrant.  She reports a rectal frequency/urgency after meals.  She reports having the pain every morning.  She reports her stools are loose and yellow.  This has been going on since 2015.  Over the last 2 months she is got significantly worse and her pain she has been avoiding greasy/fatty foods but often eats late at night as is her habit.  She had a work-up for her GI issues back in 2016.  She had known gallstones for some time before this.  She denies any recent nausea/vomiting, fevers or chills.  Past Medical History Past Medical History:  Diagnosis Date  . Allergy   . Anemia   . Anxiety   . Arrhythmia   . Arthritis   . Cervical dysplasia   . Cystitis   . Depression   . GERD (gastroesophageal reflux disease)   . Gross hematuria   . Heart murmur   . HLD (hyperlipidemia)   . Hypertension   . Hypothyroid   . Pernicious anemia   . Tobacco abuse       Past Surgical History:  Procedure Laterality Date  . CARPAL TUNNEL RELEASE Bilateral 2011  . FOOT SURGERY Right 2013   Plantar fascia  . TONSILLECTOMY  1979    Allergies  Allergen Reactions  . Bee Venom Anaphylaxis, Hives and Swelling    Carries Epi pen. Carries Epi pen.  . Cat Hair Extract Itching, Other (See Comments) and Swelling    Allergic to trees, nuts, wheat, grass, cats & dogs - itchy watery eyes, swelling. Uses Zyrtec & Flonase & Benadryl if really bad. Used to get allergy shots. Allergic to trees, nuts, wheat, grass, cats & dogs - itchy watery eyes, swelling. Uses Zyrtec & Flonase & Benadryl if really  bad. Used to get allergy shots.  . Tetracycline     Other reaction(s): Unknown  . Tetracyclines & Related Nausea And Vomiting  . Lac Bovis Diarrhea and Nausea And Vomiting  . Lactose Diarrhea and Nausea And Vomiting  . Milk Protein Diarrhea and Nausea And Vomiting  . Tape Other (See Comments) and Rash    Needs to use paper tape. Breaks out with severe rash, pulls skin off when using adhesive.    Current Outpatient Medications  Medication Sig Dispense Refill  . Accu-Chek FastClix Lancets MISC USE TO CHECK SUGAR UP TO TWICE DAILY AS DIRECTED 102 each 5  . ACCU-CHEK GUIDE test strip CHECK BLOOD SUGAR UP TO TWICE DAILY AS DIRECTED 100 strip 5  . albuterol (VENTOLIN HFA) 108 (90 Base) MCG/ACT inhaler INHALE 1 TO 2 PUFFS BY MOUTH INTO THE LUNGS EVERY 4 TO 6 HOURS AS NEEDED FOR SHORTNESS OF BREATH 18 g 2  . amoxicillin-clavulanate (AUGMENTIN) 875-125 MG tablet Take 1 tablet by mouth 2 (two) times daily. 7 days 14 tablet 0  . ARIPiprazole (ABILIFY) 2 MG tablet Take 1 tablet (2 mg total) by mouth daily.    Marland Kitchen atenolol (TENORMIN) 50 MG tablet Take 0.5 tablets (25 mg total) by mouth daily. 45 tablet 3  . Cetirizine HCl (ZYRTEC ALLERGY) 10  MG CAPS Take 1 capsule by mouth daily.     Marland Kitchen dicyclomine (BENTYL) 10 MG capsule Take 1 capsule (10 mg total) by mouth 4 (four) times daily -  before meals and at bedtime. As needed only for abdominal cramping and pain 30 capsule 2  . ferrous sulfate 325 (65 FE) MG EC tablet 1 tablet (325mg ) BID to TID based on tolerance. TAKE WITH SOURCE OF VITAMIN C. 90 tablet 2  . fluticasone (FLONASE) 50 MCG/ACT nasal spray SHAKE LIQUID AND USE 2 SPRAYS IN EACH NOSTRIL EVERY DAY 48 g 1  . fluvoxaMINE (LUVOX) 100 MG tablet Take 150 mg by mouth 2 (two) times daily.    . furosemide (LASIX) 20 MG tablet TAKE 1 TABLET(20 MG) BY MOUTH DAILY AS NEEDED FOR SWELLING 90 tablet 0  . gabapentin (NEURONTIN) 100 MG capsule Take 100-200 mg by mouth every 4 (four) hours as needed.    Marland Kitchen ipratropium  (ATROVENT) 0.06 % nasal spray Place 2 sprays into both nostrils 4 (four) times daily. For up to 5-7 days then stop. 15 mL 0  . ketoconazole (NIZORAL) 2 % shampoo APPLY EXTERNALLY 2 TIMES A WEEK 120 mL 2  . lamoTRIgine (LAMICTAL) 200 MG tablet Take 200 mg by mouth daily.    Marland Kitchen levothyroxine (SYNTHROID) 50 MCG tablet TAKE 1 TABLET BY MOUTH EVERY MORNING 90 tablet 3  . medroxyPROGESTERone (DEPO-PROVERA) 150 MG/ML injection ADMINISTER 1 ML(150 MG) IN THE MUSCLE EVERY 3 MONTHS 1 mL 1  . pantoprazole (PROTONIX) 20 MG tablet TAKE 1 TABLET(20 MG) BY MOUTH DAILY 90 tablet 1  . QUEtiapine (SEROQUEL) 25 MG tablet 25 mg 2 (two) times daily.     . SUMAtriptan (IMITREX) 5 MG/ACT nasal spray Place 1 spray (5 mg total) every 2 (two) hours as needed into the nose for migraine. Max 3 doses in 24 hours 1 Inhaler 0  . tretinoin (RETIN-A) 0.025 % cream APPLY EXTERNALLY TO THE AFFECTED AREA AT BEDTIME AS NEEDED 45 g 2  . triamcinolone ointment (KENALOG) 0.1 % Apply topically 2 (two) times daily. 30 g 0   No current facility-administered medications for this visit.    Family History Family History  Problem Relation Age of Onset  . Depression Mother   . Mental illness Mother   . Alcohol abuse Mother   . Lung cancer Mother   . Cancer Maternal Grandmother   . Diabetes Maternal Grandmother   . Ovarian cancer Maternal Grandmother   . Kidney disease Maternal Grandfather   . Diabetes Father   . Mental illness Father   . Depression Father   . Drug abuse Father   . Depression Paternal Grandfather   . Drug abuse Paternal Grandfather   . Bladder Cancer Neg Hx   . Breast cancer Neg Hx       Social History Social History   Tobacco Use  . Smoking status: Current Every Day Smoker    Packs/day: 0.50    Years: 30.00    Pack years: 15.00    Types: Cigarettes  . Smokeless tobacco: Current User  Vaping Use  . Vaping Use: Never used  Substance Use Topics  . Alcohol use: Yes  . Drug use: No        Review of  Systems  Constitutional: Positive for malaise/fatigue. Negative for weight loss.  HENT: Negative.   Eyes: Negative.   Respiratory: Negative.   Cardiovascular: Negative.   Gastrointestinal: Positive for abdominal pain, diarrhea, heartburn and nausea. Negative for blood in stool and  melena.  Genitourinary: Negative.   Skin: Negative.   Neurological: Negative.   Psychiatric/Behavioral: Negative.       Physical Exam Blood pressure 135/85, pulse 80, temperature 99 F (37.2 C), temperature source Oral, height 5\' 7"  (1.702 m), weight 274 lb (124.3 kg), SpO2 95 %. Last Weight  Most recent update: 07/05/2020  9:03 AM   Weight  124.3 kg (274 lb)            CONSTITUTIONAL: Well developed, and nourished, appropriately responsive and aware without distress.   EYES: Sclera non-icteric.   EARS, NOSE, MOUTH AND THROAT: Mask worn.   Hearing is intact to voice.  NECK: Trachea is midline, and there is no jugular venous distension.  LYMPH NODES:  Lymph nodes in the neck are not enlarged. RESPIRATORY:  Lungs are clear, and breath sounds are equal bilaterally. Normal respiratory effort without pathologic use of accessory muscles. CARDIOVASCULAR: Heart is regular in rate and rhythm. GI: The abdomen is well rounded and abundant, soft, nontender, and nondistended. There were no palpable masses. I did not appreciate hepatosplenomegaly. There were normal bowel sounds. MUSCULOSKELETAL:  Symmetrical muscle tone appreciated in all four extremities.    SKIN: Skin turgor is normal. No pathologic skin lesions appreciated.  NEUROLOGIC:  Motor and sensation appear grossly normal.  Cranial nerves are grossly without defect. PSYCH:  Alert and oriented to person, place and time. Affect is appropriate for situation.  Data Reviewed I have personally reviewed what is currently available of the patient's imaging, recent labs and medical records.   Labs:  CBC Latest Ref Rng & Units 06/02/2020 05/19/2019 02/12/2019  WBC 4.0  - 10.5 K/uL 10.3 9.5 11.4(H)  Hemoglobin 12.0 - 15.0 g/dL 14.2 14.8 14.7  Hematocrit 36 - 46 % 40.9 43.0 43.4  Platelets 150 - 400 K/uL 231 243 267   CMP Latest Ref Rng & Units 01/01/2019 03/12/2018 11/25/2016  Glucose 65 - 99 mg/dL 133(H) 115(H) 112(H)  BUN 7 - 25 mg/dL 9 9 5(L)  Creatinine 0.50 - 1.10 mg/dL 0.65 0.74 0.71  Sodium 135 - 146 mmol/L 141 142 140  Potassium 3.5 - 5.3 mmol/L 4.2 3.9 3.6  Chloride 98 - 110 mmol/L 106 109 107  CO2 20 - 32 mmol/L 27 28 25   Calcium 8.6 - 10.2 mg/dL 9.1 9.3 8.9  Total Protein 6.1 - 8.1 g/dL 6.6 6.3 8.0  Total Bilirubin 0.2 - 1.2 mg/dL 0.4 0.3 0.6  Alkaline Phos 38 - 126 U/L - - 77  AST 10 - 35 U/L 15 12 19   ALT 6 - 29 U/L 17 10 16      Imaging: Radiology review:   CLINICAL DATA:  Provided history: Colicky right upper quadrant abdominal pain. Generalized abdominal pain. Additional history provided: Chronic (greater than 2 months) abdominal pain, generalized, right worse than left, gallstones on last ultrasound in 2016, pain worse after eating.  EXAM: ABDOMEN ULTRASOUND COMPLETE  COMPARISON:  Abdominal ultrasound 10/21/2015  FINDINGS: Gallbladder: There is cholecystolithiasis. No gallbladder wall thickening is visualized. No sonographic Percell Miller sign is elicited by the scanning technologist.  Common bile duct: Diameter: 3-4 mm, within normal limits.  Liver: No focal lesion identified. Within normal limits in parenchymal echogenicity. Portal vein is patent on color Doppler imaging with normal direction of blood flow towards the liver.  IVC: No abnormality visualized.  Pancreas: Obscured by overlying bowel gas.  Spleen: Size and appearance within normal limits.  Right Kidney: Length: 11.1 cm. Echogenicity within normal limits. No mass or hydronephrosis visualized.  Left Kidney: Length: 11.5 cm. Echogenicity within normal limits. No mass or hydronephrosis visualized.  Abdominal aorta: No aneurysm  visualized.  IMPRESSION: Cholecystolithiasis without sonographic evidence of acute cholecystitis.  Hyperechogenicity of the hepatic parenchyma. This is a nonspecific finding, which may be seen in the setting of hepatic steatosis or other chronic hepatic parenchymal disease.  The pancreas is obscured by overlying bowel gas, limiting evaluation.  Otherwise unremarkable abdominal ultrasound as described.   Electronically Signed   By: Kellie Simmering DO   On: 06/22/2020 16:22  Within last 24 hrs: No results found.  Assessment    Chronic calculus cholecystitis Patient Active Problem List   Diagnosis Date Noted  . Hepatic steatosis 06/23/2020  . Lymphedema 03/09/2020  . Chronic venous insufficiency 03/09/2020  . Bilateral lower extremity edema 02/16/2020  . Female cystocele 10/21/2018  . Urinary incontinence, mixed 10/21/2018  . B12 deficiency 04/08/2018  . Iron deficiency anemia 03/14/2018  . GAD (generalized anxiety disorder) 03/10/2018  . Acute right-sided low back pain 10/28/2017  . Tobacco abuse 05/09/2017  . Multiple thyroid nodules 12/28/2016  . Simple chronic bronchitis ([place]) 12/13/2016  . BMI 40.0-44.9, adult ([place]) 09/26/2016  . HLD (hyperlipidemia) 09/26/2016  . Vitamin D deficiency 09/26/2016  . Pre-diabetes 06/17/2015  . Candidal intertrigo 02/21/2015  . Benign cyst of right kidney 01/17/2015  . Whiplash injury 09/03/2014  . Heart palpitations 04/02/2014  . Allergic rhinitis 12/31/2013  . Spondylosis of lumbar region without myelopathy or radiculopathy 12/28/2013  . Chronic low back pain 09/28/2013  . Other fatigue 09/28/2013  . Hypothyroidism 09/28/2013  . Alteration of body temperature 09/28/2013  . Plantar fasciitis, bilateral 07/10/2013  . Tarsal tunnel syndrome of right side 07/10/2013  . Plantar fascial fibromatosis 07/10/2013  . Encounter for surveillance of injectable contraceptive 05/13/2013  . Encounter for Depo-Provera contraception  05/13/2013  . Diuresis excessive 03/24/2013  . Abnormal uterine and vaginal bleeding, unspecified 03/24/2013  . Polyuria 03/24/2013  . Arthritis, multiple joint involvement 03/17/2013  . Fibroblastic disorder 07/28/2012  . Borderline personality disorder ([place]) 12/17/2010  . Essential hypertension 02/18/2010  . Carpal tunnel syndrome on both sides 12/17/2006  . Gall bladder stones 12/17/1998  . Major depressive disorder, single episode 02/21/1998  . Disk prolapse 04/17/1995  . Depression with anxiety 03/18/1995  . OCD (obsessive compulsive disorder) 03/18/1995  . Severe anxiety with panic 03/18/1995    Plan    Robotic cholecystectomy.  This patient was seen and examined, and I concur with the H&P associated with this note.  This was discussed thoroughly.  Optimal plan is for robotic cholecystectomy.  Risks and benefits have been discussed with the patient which include but are not limited to anesthesia, bleeding, infection, biliary ductal injury or stenosis, other associated unanticipated injuries affiliated with laparoscopic surgery.  I believe there is the desire to proceed, interpreter utilized as needed.  Questions elicited and answered to satisfaction.  No guarantees ever expressed or implied.  Face-to-face time spent with the patient and accompanying care providers(if present) was 30 minutes, with more than 50% of the time spent counseling, educating, and coordinating care of the patient.      Ronny Bacon M.D., FACS 07/05/2020, 11:07 AM

## 2020-07-05 NOTE — Telephone Encounter (Signed)
Pt has been advised of Pre-Admission date/time, COVID Testing date and Surgery date.  Surgery Date: 07/15/20 Preadmission Testing Date: 07/11/20 (phone 8a-1p) Covid Testing Date: 07/13/20 - patient advised to go to the [facility] between 8a-1p   Patient has been made aware to call [phone], between 1-3:00pm the day before surgery, to find out what time to arrive for surgery.

## 2020-07-05 NOTE — H&P (View-Only) (Signed)
Patient ID: Amanda Webb, female   DOB: 1971/10/21, 49 y.o.   MRN: [id number]  Chief Complaint: Gallstones  History of Present Illness Amanda Webb is a 49 y.o. female with 20+ year history of having multiple attacks over the years.  Reports having prior ED visits which she had the pain cocktail without improvement.  She reports the pain has also been dull and nagging, located primarily in the right upper quadrant.  She reports a rectal frequency/urgency after meals.  She reports having the pain every morning.  She reports her stools are loose and yellow.  This has been going on since 2015.  Over the last 2 months she is got significantly worse and her pain she has been avoiding greasy/fatty foods but often eats late at night as is her habit.  She had a work-up for her GI issues back in 2016.  She had known gallstones for some time before this.  She denies any recent nausea/vomiting, fevers or chills.  Past Medical History Past Medical History:  Diagnosis Date  . Allergy   . Anemia   . Anxiety   . Arrhythmia   . Arthritis   . Cervical dysplasia   . Cystitis   . Depression   . GERD (gastroesophageal reflux disease)   . Gross hematuria   . Heart murmur   . HLD (hyperlipidemia)   . Hypertension   . Hypothyroid   . Pernicious anemia   . Tobacco abuse       Past Surgical History:  Procedure Laterality Date  . CARPAL TUNNEL RELEASE Bilateral 2011  . FOOT SURGERY Right 2013   Plantar fascia  . TONSILLECTOMY  1979    Allergies  Allergen Reactions  . Bee Venom Anaphylaxis, Hives and Swelling    Carries Epi pen. Carries Epi pen.  . Cat Hair Extract Itching, Other (See Comments) and Swelling    Allergic to trees, nuts, wheat, grass, cats & dogs - itchy watery eyes, swelling. Uses Zyrtec & Flonase & Benadryl if really bad. Used to get allergy shots. Allergic to trees, nuts, wheat, grass, cats & dogs - itchy watery eyes, swelling. Uses Zyrtec & Flonase & Benadryl if really  bad. Used to get allergy shots.  . Tetracycline     Other reaction(s): Unknown  . Tetracyclines & Related Nausea And Vomiting  . Lac Bovis Diarrhea and Nausea And Vomiting  . Lactose Diarrhea and Nausea And Vomiting  . Milk Protein Diarrhea and Nausea And Vomiting  . Tape Other (See Comments) and Rash    Needs to use paper tape. Breaks out with severe rash, pulls skin off when using adhesive.    Current Outpatient Medications  Medication Sig Dispense Refill  . Accu-Chek FastClix Lancets MISC USE TO CHECK SUGAR UP TO TWICE DAILY AS DIRECTED 102 each 5  . ACCU-CHEK GUIDE test strip CHECK BLOOD SUGAR UP TO TWICE DAILY AS DIRECTED 100 strip 5  . albuterol (VENTOLIN HFA) 108 (90 Base) MCG/ACT inhaler INHALE 1 TO 2 PUFFS BY MOUTH INTO THE LUNGS EVERY 4 TO 6 HOURS AS NEEDED FOR SHORTNESS OF BREATH 18 g 2  . amoxicillin-clavulanate (AUGMENTIN) 875-125 MG tablet Take 1 tablet by mouth 2 (two) times daily. 7 days 14 tablet 0  . ARIPiprazole (ABILIFY) 2 MG tablet Take 1 tablet (2 mg total) by mouth daily.    Marland Kitchen atenolol (TENORMIN) 50 MG tablet Take 0.5 tablets (25 mg total) by mouth daily. 45 tablet 3  . Cetirizine HCl (ZYRTEC ALLERGY) 10  MG CAPS Take 1 capsule by mouth daily.     Marland Kitchen dicyclomine (BENTYL) 10 MG capsule Take 1 capsule (10 mg total) by mouth 4 (four) times daily -  before meals and at bedtime. As needed only for abdominal cramping and pain 30 capsule 2  . ferrous sulfate 325 (65 FE) MG EC tablet 1 tablet (325mg ) BID to TID based on tolerance. TAKE WITH SOURCE OF VITAMIN C. 90 tablet 2  . fluticasone (FLONASE) 50 MCG/ACT nasal spray SHAKE LIQUID AND USE 2 SPRAYS IN EACH NOSTRIL EVERY DAY 48 g 1  . fluvoxaMINE (LUVOX) 100 MG tablet Take 150 mg by mouth 2 (two) times daily.    . furosemide (LASIX) 20 MG tablet TAKE 1 TABLET(20 MG) BY MOUTH DAILY AS NEEDED FOR SWELLING 90 tablet 0  . gabapentin (NEURONTIN) 100 MG capsule Take 100-200 mg by mouth every 4 (four) hours as needed.    Marland Kitchen ipratropium  (ATROVENT) 0.06 % nasal spray Place 2 sprays into both nostrils 4 (four) times daily. For up to 5-7 days then stop. 15 mL 0  . ketoconazole (NIZORAL) 2 % shampoo APPLY EXTERNALLY 2 TIMES A WEEK 120 mL 2  . lamoTRIgine (LAMICTAL) 200 MG tablet Take 200 mg by mouth daily.    Marland Kitchen levothyroxine (SYNTHROID) 50 MCG tablet TAKE 1 TABLET BY MOUTH EVERY MORNING 90 tablet 3  . medroxyPROGESTERone (DEPO-PROVERA) 150 MG/ML injection ADMINISTER 1 ML(150 MG) IN THE MUSCLE EVERY 3 MONTHS 1 mL 1  . pantoprazole (PROTONIX) 20 MG tablet TAKE 1 TABLET(20 MG) BY MOUTH DAILY 90 tablet 1  . QUEtiapine (SEROQUEL) 25 MG tablet 25 mg 2 (two) times daily.     . SUMAtriptan (IMITREX) 5 MG/ACT nasal spray Place 1 spray (5 mg total) every 2 (two) hours as needed into the nose for migraine. Max 3 doses in 24 hours 1 Inhaler 0  . tretinoin (RETIN-A) 0.025 % cream APPLY EXTERNALLY TO THE AFFECTED AREA AT BEDTIME AS NEEDED 45 g 2  . triamcinolone ointment (KENALOG) 0.1 % Apply topically 2 (two) times daily. 30 g 0   No current facility-administered medications for this visit.    Family History Family History  Problem Relation Age of Onset  . Depression Mother   . Mental illness Mother   . Alcohol abuse Mother   . Lung cancer Mother   . Cancer Maternal Grandmother   . Diabetes Maternal Grandmother   . Ovarian cancer Maternal Grandmother   . Kidney disease Maternal Grandfather   . Diabetes Father   . Mental illness Father   . Depression Father   . Drug abuse Father   . Depression Paternal Grandfather   . Drug abuse Paternal Grandfather   . Bladder Cancer Neg Hx   . Breast cancer Neg Hx       Social History Social History   Tobacco Use  . Smoking status: Current Every Day Smoker    Packs/day: 0.50    Years: 30.00    Pack years: 15.00    Types: Cigarettes  . Smokeless tobacco: Current User  Vaping Use  . Vaping Use: Never used  Substance Use Topics  . Alcohol use: Yes  . Drug use: No        Review of  Systems  Constitutional: Positive for malaise/fatigue. Negative for weight loss.  HENT: Negative.   Eyes: Negative.   Respiratory: Negative.   Cardiovascular: Negative.   Gastrointestinal: Positive for abdominal pain, diarrhea, heartburn and nausea. Negative for blood in stool and  melena.  Genitourinary: Negative.   Skin: Negative.   Neurological: Negative.   Psychiatric/Behavioral: Negative.       Physical Exam Blood pressure 135/85, pulse 80, temperature 99 F (37.2 C), temperature source Oral, height 5\' 7"  (1.702 m), weight 274 lb (124.3 kg), SpO2 95 %. Last Weight  Most recent update: 07/05/2020  9:03 AM   Weight  124.3 kg (274 lb)            CONSTITUTIONAL: Well developed, and nourished, appropriately responsive and aware without distress.   EYES: Sclera non-icteric.   EARS, NOSE, MOUTH AND THROAT: Mask worn.   Hearing is intact to voice.  NECK: Trachea is midline, and there is no jugular venous distension.  LYMPH NODES:  Lymph nodes in the neck are not enlarged. RESPIRATORY:  Lungs are clear, and breath sounds are equal bilaterally. Normal respiratory effort without pathologic use of accessory muscles. CARDIOVASCULAR: Heart is regular in rate and rhythm. GI: The abdomen is well rounded and abundant, soft, nontender, and nondistended. There were no palpable masses. I did not appreciate hepatosplenomegaly. There were normal bowel sounds. MUSCULOSKELETAL:  Symmetrical muscle tone appreciated in all four extremities.    SKIN: Skin turgor is normal. No pathologic skin lesions appreciated.  NEUROLOGIC:  Motor and sensation appear grossly normal.  Cranial nerves are grossly without defect. PSYCH:  Alert and oriented to person, place and time. Affect is appropriate for situation.  Data Reviewed I have personally reviewed what is currently available of the patient's imaging, recent labs and medical records.   Labs:  CBC Latest Ref Rng & Units 06/02/2020 05/19/2019 02/12/2019  WBC 4.0  - 10.5 K/uL 10.3 9.5 11.4(H)  Hemoglobin 12.0 - 15.0 g/dL 14.2 14.8 14.7  Hematocrit 36 - 46 % 40.9 43.0 43.4  Platelets 150 - 400 K/uL 231 243 267   CMP Latest Ref Rng & Units 01/01/2019 03/12/2018 11/25/2016  Glucose 65 - 99 mg/dL 133(H) 115(H) 112(H)  BUN 7 - 25 mg/dL 9 9 5(L)  Creatinine 0.50 - 1.10 mg/dL 0.65 0.74 0.71  Sodium 135 - 146 mmol/L 141 142 140  Potassium 3.5 - 5.3 mmol/L 4.2 3.9 3.6  Chloride 98 - 110 mmol/L 106 109 107  CO2 20 - 32 mmol/L 27 28 25   Calcium 8.6 - 10.2 mg/dL 9.1 9.3 8.9  Total Protein 6.1 - 8.1 g/dL 6.6 6.3 8.0  Total Bilirubin 0.2 - 1.2 mg/dL 0.4 0.3 0.6  Alkaline Phos 38 - 126 U/L - - 77  AST 10 - 35 U/L 15 12 19   ALT 6 - 29 U/L 17 10 16      Imaging: Radiology review:   CLINICAL DATA:  Provided history: Colicky right upper quadrant abdominal pain. Generalized abdominal pain. Additional history provided: Chronic (greater than 2 months) abdominal pain, generalized, right worse than left, gallstones on last ultrasound in 2016, pain worse after eating.  EXAM: ABDOMEN ULTRASOUND COMPLETE  COMPARISON:  Abdominal ultrasound 10/21/2015  FINDINGS: Gallbladder: There is cholecystolithiasis. No gallbladder wall thickening is visualized. No sonographic Percell Miller sign is elicited by the scanning technologist.  Common bile duct: Diameter: 3-4 mm, within normal limits.  Liver: No focal lesion identified. Within normal limits in parenchymal echogenicity. Portal vein is patent on color Doppler imaging with normal direction of blood flow towards the liver.  IVC: No abnormality visualized.  Pancreas: Obscured by overlying bowel gas.  Spleen: Size and appearance within normal limits.  Right Kidney: Length: 11.1 cm. Echogenicity within normal limits. No mass or hydronephrosis visualized.  Left Kidney: Length: 11.5 cm. Echogenicity within normal limits. No mass or hydronephrosis visualized.  Abdominal aorta: No aneurysm  visualized.  IMPRESSION: Cholecystolithiasis without sonographic evidence of acute cholecystitis.  Hyperechogenicity of the hepatic parenchyma. This is a nonspecific finding, which may be seen in the setting of hepatic steatosis or other chronic hepatic parenchymal disease.  The pancreas is obscured by overlying bowel gas, limiting evaluation.  Otherwise unremarkable abdominal ultrasound as described.   Electronically Signed   By: Kellie Simmering DO   On: 06/22/2020 16:22  Within last 24 hrs: No results found.  Assessment    Chronic calculus cholecystitis Patient Active Problem List   Diagnosis Date Noted  . Hepatic steatosis 06/23/2020  . Lymphedema 03/09/2020  . Chronic venous insufficiency 03/09/2020  . Bilateral lower extremity edema 02/16/2020  . Female cystocele 10/21/2018  . Urinary incontinence, mixed 10/21/2018  . B12 deficiency 04/08/2018  . Iron deficiency anemia 03/14/2018  . GAD (generalized anxiety disorder) 03/10/2018  . Acute right-sided low back pain 10/28/2017  . Tobacco abuse 05/09/2017  . Multiple thyroid nodules 12/28/2016  . Simple chronic bronchitis ([place]) 12/13/2016  . BMI 40.0-44.9, adult ([place]) 09/26/2016  . HLD (hyperlipidemia) 09/26/2016  . Vitamin D deficiency 09/26/2016  . Pre-diabetes 06/17/2015  . Candidal intertrigo 02/21/2015  . Benign cyst of right kidney 01/17/2015  . Whiplash injury 09/03/2014  . Heart palpitations 04/02/2014  . Allergic rhinitis 12/31/2013  . Spondylosis of lumbar region without myelopathy or radiculopathy 12/28/2013  . Chronic low back pain 09/28/2013  . Other fatigue 09/28/2013  . Hypothyroidism 09/28/2013  . Alteration of body temperature 09/28/2013  . Plantar fasciitis, bilateral 07/10/2013  . Tarsal tunnel syndrome of right side 07/10/2013  . Plantar fascial fibromatosis 07/10/2013  . Encounter for surveillance of injectable contraceptive 05/13/2013  . Encounter for Depo-Provera contraception  05/13/2013  . Diuresis excessive 03/24/2013  . Abnormal uterine and vaginal bleeding, unspecified 03/24/2013  . Polyuria 03/24/2013  . Arthritis, multiple joint involvement 03/17/2013  . Fibroblastic disorder 07/28/2012  . Borderline personality disorder ([place]) 12/17/2010  . Essential hypertension 02/18/2010  . Carpal tunnel syndrome on both sides 12/17/2006  . Gall bladder stones 12/17/1998  . Major depressive disorder, single episode 02/21/1998  . Disk prolapse 04/17/1995  . Depression with anxiety 03/18/1995  . OCD (obsessive compulsive disorder) 03/18/1995  . Severe anxiety with panic 03/18/1995    Plan    Robotic cholecystectomy.  This patient was seen and examined, and I concur with the H&P associated with this note.  This was discussed thoroughly.  Optimal plan is for robotic cholecystectomy.  Risks and benefits have been discussed with the patient which include but are not limited to anesthesia, bleeding, infection, biliary ductal injury or stenosis, other associated unanticipated injuries affiliated with laparoscopic surgery.  I believe there is the desire to proceed, interpreter utilized as needed.  Questions elicited and answered to satisfaction.  No guarantees ever expressed or implied.  Face-to-face time spent with the patient and accompanying care providers(if present) was 30 minutes, with more than 50% of the time spent counseling, educating, and coordinating care of the patient.      Ronny Bacon M.D., FACS 07/05/2020, 11:07 AM

## 2020-07-06 ENCOUNTER — Inpatient Hospital Stay: Payer: Medicare Other

## 2020-07-06 ENCOUNTER — Inpatient Hospital Stay: Payer: Medicare Other | Attending: Hematology and Oncology

## 2020-07-06 ENCOUNTER — Other Ambulatory Visit: Payer: Self-pay

## 2020-07-06 DIAGNOSIS — D509 Iron deficiency anemia, unspecified: Secondary | ICD-10-CM

## 2020-07-06 DIAGNOSIS — E538 Deficiency of other specified B group vitamins: Secondary | ICD-10-CM | POA: Insufficient documentation

## 2020-07-06 MED ORDER — CYANOCOBALAMIN 1000 MCG/ML IJ SOLN
1000.0000 ug | Freq: Once | INTRAMUSCULAR | Status: AC
  Administered 2020-07-06: 1000 ug via INTRAMUSCULAR
  Filled 2020-07-06: qty 1

## 2020-07-07 ENCOUNTER — Encounter (INDEPENDENT_AMBULATORY_CARE_PROVIDER_SITE_OTHER): Payer: Medicare Other

## 2020-07-07 ENCOUNTER — Ambulatory Visit (INDEPENDENT_AMBULATORY_CARE_PROVIDER_SITE_OTHER): Payer: Medicare Other | Admitting: Vascular Surgery

## 2020-07-11 ENCOUNTER — Encounter
Admission: RE | Admit: 2020-07-11 | Discharge: 2020-07-11 | Disposition: A | Discharge status: Home / Self Care | Payer: Medicare Other | Source: Ambulatory Visit | Attending: Surgery | Admitting: Surgery

## 2020-07-11 ENCOUNTER — Other Ambulatory Visit: Payer: Self-pay

## 2020-07-11 DIAGNOSIS — I10 Essential (primary) hypertension: Secondary | ICD-10-CM | POA: Diagnosis not present

## 2020-07-11 DIAGNOSIS — Z01818 Encounter for other preprocedural examination: Secondary | ICD-10-CM | POA: Diagnosis not present

## 2020-07-11 HISTORY — DX: Palpitations: R00.2

## 2020-07-11 HISTORY — DX: Lymphedema, not elsewhere classified: I89.0

## 2020-07-11 HISTORY — DX: Headache, unspecified: R51.9

## 2020-07-11 NOTE — Patient Instructions (Addendum)
Your procedure is scheduled on: 07-15-20 FRIDAY Report to Same Day Surgery 2nd floor medical mall [facility] Entrance-take elevator on left to 2nd floor.  Check in with surgery information desk.) To find out your arrival time please call [phone] between 1PM - 3PM on  07-14-20 THURSDAY  Remember: Instructions that are not followed completely may result in serious medical risk, up to and including death, or upon the discretion of your surgeon and anesthesiologist your surgery may need to be rescheduled.    _x___ 1. Do not eat food after midnight the night before your procedure. NO GUM OR CANDY AFTER MIDNIGHT. You may drink clear liquids up to 2 hours before you are scheduled to arrive at the hospital for your procedure.  Do not drink clear liquids within 2 hours of your scheduled arrival to the hospital.  Clear liquids include  --Water or Apple juice without pulp  --Gatorade  --Black Coffee or Clear Tea (No milk, no creamers, do not add anything to the coffee or Tea   __x__ 2. No Alcohol for 24 hours before or after surgery.   __x__3. No Smoking or e-cigarettes for 24 prior to surgery.  Do not use any chewable tobacco products for at least 6 hour prior to surgery   ____  4. Bring all medications with you on the day of surgery if instructed.    __x__ 5. Notify your doctor if there is any change in your medical condition     (cold, fever, infections).    x___6. On the morning of surgery brush your teeth with toothpaste and water.  You may rinse your mouth with mouth wash if you wish.  Do not swallow any toothpaste or mouthwash.   Do not wear jewelry, make-up, hairpins, clips or nail polish.  Do not wear lotions, powders, or perfumes.   Do not shave 48 hours prior to surgery. Men may shave face and neck.  Do not bring valuables to the hospital.    [facility] is not responsible for any belongings or valuables.               Contacts, dentures or bridgework may not be worn into  surgery.  Leave your suitcase in the car. After surgery it may be brought to your room.  For patients admitted to the hospital, discharge time is determined by your treatment team.  _  Patients discharged the day of surgery will not be allowed to drive home.  You will need someone to drive you home and stay with you the night of your procedure.    Please read over the following fact sheets that you were given:   [facility] Preparing for Surgery   _x___ TAKE THE FOLLOWING MEDICATION THE MORNING OF SURGERY WITH A SMALL SIP OF WATER. These include:  1. LUVOX (FLUVOXAMINE)  2. ABILIFY (ARIPIPRAZOLE)  3. ATENOLOL (TENORMIN)  4. GABAPENTIN (NEURONTIN)  5. LAMICTAL (LAMOTRIGINE)  6. SEROQUEL (QUETIAPINE)  7. SYNTHROID (LEVOTHYROXINE)  8.  PROTONIX (PANTOPRAZOLE)  9. TAKE AN EXTRA PROTONIX BEFORE BED THE NIGHT BEFORE YOUR SURGERY  ____Fleets enema or Magnesium Citrate as directed.   _x___ Use CHG Soap as directed on instruction sheet   _X___ Use inhalers on the day of surgery and bring to hospital day of surgery-USE YOUR ALBUTEROL INHALER THE DAY OF SURGERY AND [place]  ____ Stop Metformin and Janumet 2 days prior to surgery.    ____ Take 1/2 of usual insulin dose the night before surgery  and none on the morning  surgery.   ____ Follow recommendations from Cardiologist, Pulmonologist or PCP regarding stopping Aspirin, Coumadin, Plavix ,Eliquis, Effient, or Pradaxa, and Pletal.  X____Stop Anti-inflammatories such as Advil, Aleve, Ibuprofen, Motrin, Naproxen, Naprosyn, Goodies powders or aspirin products NOW-OK to take Tylenol    ____ Stop supplements until after surgery.   ____ Bring C-Pap to the hospital.

## 2020-07-12 ENCOUNTER — Other Ambulatory Visit: Payer: Self-pay | Admitting: Family Medicine

## 2020-07-12 ENCOUNTER — Encounter
Admission: RE | Admit: 2020-07-12 | Discharge: 2020-07-12 | Disposition: A | Discharge status: Home / Self Care | Payer: Medicare Other | Source: Ambulatory Visit | Attending: Surgery | Admitting: Surgery

## 2020-07-12 DIAGNOSIS — I10 Essential (primary) hypertension: Secondary | ICD-10-CM | POA: Insufficient documentation

## 2020-07-12 DIAGNOSIS — K219 Gastro-esophageal reflux disease without esophagitis: Secondary | ICD-10-CM

## 2020-07-12 DIAGNOSIS — Z01818 Encounter for other preprocedural examination: Secondary | ICD-10-CM | POA: Insufficient documentation

## 2020-07-12 LAB — COMPREHENSIVE METABOLIC PANEL
ALT: 23 U/L (ref 0–44)
AST: 21 U/L (ref 15–41)
Albumin: 4.3 g/dL (ref 3.5–5.0)
Alkaline Phosphatase: 68 U/L (ref 38–126)
Anion gap: 7 (ref 5–15)
BUN: 6 mg/dL (ref 6–20)
CO2: 23 mmol/L (ref 22–32)
Calcium: 8.7 mg/dL — ABNORMAL LOW (ref 8.9–10.3)
Chloride: 105 mmol/L (ref 98–111)
Creatinine, Ser: 0.61 mg/dL (ref 0.44–1.00)
GFR calc Af Amer: >60 mL/min (ref >60)
GFR calc non Af Amer: >60 mL/min (ref >60)
Glucose, Bld: 147 mg/dL — ABNORMAL HIGH (ref 70–99)
Potassium: 3.8 mmol/L (ref 3.5–5.1)
Sodium: 135 mmol/L (ref 135–145)
Total Bilirubin: 0.7 mg/dL (ref 0.3–1.2)
Total Protein: 7.2 g/dL (ref 6.5–8.1)

## 2020-07-12 LAB — CBC WITH DIFFERENTIAL/PLATELET
Abs Immature Granulocytes: 0.04 10*3/uL (ref 0.00–0.07)
Basophils Absolute: 0 10*3/uL (ref 0.0–0.1)
Basophils Relative: 0 %
Eosinophils Absolute: 0.2 10*3/uL (ref 0.0–0.5)
Eosinophils Relative: 2 %
HCT: 39.5 % (ref 36.0–46.0)
Hemoglobin: 13.6 g/dL (ref 12.0–15.0)
Immature Granulocytes: 0 %
Lymphocytes Relative: 31 %
Lymphs Abs: 3.1 10*3/uL (ref 0.7–4.0)
MCH: 32.2 pg (ref 26.0–34.0)
MCHC: 34.4 g/dL (ref 30.0–36.0)
MCV: 93.4 fL (ref 80.0–100.0)
Monocytes Absolute: 0.6 10*3/uL (ref 0.1–1.0)
Monocytes Relative: 6 %
Neutro Abs: 6 10*3/uL (ref 1.7–7.7)
Neutrophils Relative %: 61 %
Platelets: 234 10*3/uL (ref 150–400)
RBC: 4.23 MIL/uL (ref 3.87–5.11)
RDW: 12.6 % (ref 11.5–15.5)
WBC: 10 10*3/uL (ref 4.0–10.5)
nRBC: 0 % (ref 0.0–0.2)

## 2020-07-12 NOTE — Telephone Encounter (Signed)
Requested Prescriptions  Pending Prescriptions Disp Refills  . pantoprazole (PROTONIX) 20 MG tablet [Pharmacy Med Name: PANTOPRAZOLE 20MG  TABLETS] 90 tablet 1    Sig: TAKE 1 TABLET(20 MG) BY MOUTH DAILY     Gastroenterology: Proton Pump Inhibitors Passed - 07/12/2020  6:29 AM      Passed - Valid encounter within last 12 months    Recent Outpatient Visits          4 weeks ago Colicky RUQ abdominal pain   [place], DO   4 months ago Bilateral lower extremity edema   [place], DO   6 months ago Pain of finger of left hand   [place], DO   11 months ago Acute non-recurrent pansinusitis   [facility] Merrilyn Puma, Jerrel Ivory, NP   1 year ago Essential hypertension   [place], DO      Future Appointments            In 2 months Amalia Hailey, Nyoka Lint, MD [facility]

## 2020-07-13 ENCOUNTER — Other Ambulatory Visit: Admission: RE | Admit: 2020-07-13 | Payer: Medicare Other | Source: Ambulatory Visit

## 2020-07-14 ENCOUNTER — Telehealth: Payer: Self-pay

## 2020-07-14 ENCOUNTER — Encounter: Payer: Self-pay | Admitting: Surgery

## 2020-07-14 ENCOUNTER — Other Ambulatory Visit: Payer: Self-pay

## 2020-07-14 ENCOUNTER — Other Ambulatory Visit
Admission: RE | Admit: 2020-07-14 | Discharge: 2020-07-14 | Disposition: A | Discharge status: Home / Self Care | Payer: Medicare Other | Source: Ambulatory Visit | Attending: Surgery | Admitting: Surgery

## 2020-07-14 DIAGNOSIS — Z01812 Encounter for preprocedural laboratory examination: Secondary | ICD-10-CM | POA: Insufficient documentation

## 2020-07-14 DIAGNOSIS — Z20822 Contact with and (suspected) exposure to covid-19: Secondary | ICD-10-CM | POA: Insufficient documentation

## 2020-07-14 DIAGNOSIS — F4310 Post-traumatic stress disorder, unspecified: Secondary | ICD-10-CM | POA: Diagnosis not present

## 2020-07-14 LAB — SARS CORONAVIRUS 2 (TAT 6-24 HRS): SARS Coronavirus 2: NEGATIVE

## 2020-07-14 NOTE — Telephone Encounter (Signed)
Per Dr.Rodenberg to advise patient she will be fine and her sodium levels are in normal range and there are no recommendations that he suggests at this time. Patient states she is very anxious regarding surgery. I reassured patient that she everything will be ok and informed her if she has any other questions or concerns regarding surgery she could give our office a call back. Patient verbalized understanding and has no further questions at this time.

## 2020-07-15 ENCOUNTER — Encounter
Admission: RE | Disposition: A | Discharge status: Home / Self Care | Payer: Self-pay | Source: Ambulatory Visit | Attending: Surgery

## 2020-07-15 ENCOUNTER — Ambulatory Visit
Admission: RE | Admit: 2020-07-15 | Discharge: 2020-07-15 | Disposition: A | Discharge status: Home / Self Care | Payer: Medicare Other | Source: Ambulatory Visit | Attending: Surgery | Admitting: Surgery

## 2020-07-15 ENCOUNTER — Encounter: Payer: Self-pay | Admitting: Surgery

## 2020-07-15 ENCOUNTER — Ambulatory Visit: Payer: Medicare Other | Admitting: Certified Registered Nurse Anesthetist

## 2020-07-15 DIAGNOSIS — M1990 Unspecified osteoarthritis, unspecified site: Secondary | ICD-10-CM | POA: Diagnosis not present

## 2020-07-15 DIAGNOSIS — Z79899 Other long term (current) drug therapy: Secondary | ICD-10-CM | POA: Insufficient documentation

## 2020-07-15 DIAGNOSIS — G8929 Other chronic pain: Secondary | ICD-10-CM | POA: Insufficient documentation

## 2020-07-15 DIAGNOSIS — Z91030 Bee allergy status: Secondary | ICD-10-CM | POA: Insufficient documentation

## 2020-07-15 DIAGNOSIS — E739 Lactose intolerance, unspecified: Secondary | ICD-10-CM | POA: Insufficient documentation

## 2020-07-15 DIAGNOSIS — F419 Anxiety disorder, unspecified: Secondary | ICD-10-CM | POA: Insufficient documentation

## 2020-07-15 DIAGNOSIS — Z881 Allergy status to other antibiotic agents status: Secondary | ICD-10-CM | POA: Diagnosis not present

## 2020-07-15 DIAGNOSIS — Z833 Family history of diabetes mellitus: Secondary | ICD-10-CM | POA: Insufficient documentation

## 2020-07-15 DIAGNOSIS — Z8041 Family history of malignant neoplasm of ovary: Secondary | ICD-10-CM | POA: Insufficient documentation

## 2020-07-15 DIAGNOSIS — Z6841 Body Mass Index (BMI) 40.0 and over, adult: Secondary | ICD-10-CM | POA: Insufficient documentation

## 2020-07-15 DIAGNOSIS — Z793 Long term (current) use of hormonal contraceptives: Secondary | ICD-10-CM | POA: Insufficient documentation

## 2020-07-15 DIAGNOSIS — R519 Headache, unspecified: Secondary | ICD-10-CM | POA: Diagnosis not present

## 2020-07-15 DIAGNOSIS — K828 Other specified diseases of gallbladder: Secondary | ICD-10-CM | POA: Diagnosis not present

## 2020-07-15 DIAGNOSIS — E785 Hyperlipidemia, unspecified: Secondary | ICD-10-CM | POA: Diagnosis not present

## 2020-07-15 DIAGNOSIS — F17210 Nicotine dependence, cigarettes, uncomplicated: Secondary | ICD-10-CM | POA: Insufficient documentation

## 2020-07-15 DIAGNOSIS — F17200 Nicotine dependence, unspecified, uncomplicated: Secondary | ICD-10-CM | POA: Insufficient documentation

## 2020-07-15 DIAGNOSIS — N281 Cyst of kidney, acquired: Secondary | ICD-10-CM | POA: Insufficient documentation

## 2020-07-15 DIAGNOSIS — M545 Low back pain: Secondary | ICD-10-CM | POA: Insufficient documentation

## 2020-07-15 DIAGNOSIS — Z801 Family history of malignant neoplasm of trachea, bronchus and lung: Secondary | ICD-10-CM | POA: Insufficient documentation

## 2020-07-15 DIAGNOSIS — Z888 Allergy status to other drugs, medicaments and biological substances status: Secondary | ICD-10-CM | POA: Diagnosis not present

## 2020-07-15 DIAGNOSIS — M47816 Spondylosis without myelopathy or radiculopathy, lumbar region: Secondary | ICD-10-CM | POA: Insufficient documentation

## 2020-07-15 DIAGNOSIS — Z8614 Personal history of Methicillin resistant Staphylococcus aureus infection: Secondary | ICD-10-CM | POA: Insufficient documentation

## 2020-07-15 DIAGNOSIS — R011 Cardiac murmur, unspecified: Secondary | ICD-10-CM | POA: Diagnosis not present

## 2020-07-15 DIAGNOSIS — E039 Hypothyroidism, unspecified: Secondary | ICD-10-CM | POA: Insufficient documentation

## 2020-07-15 DIAGNOSIS — F429 Obsessive-compulsive disorder, unspecified: Secondary | ICD-10-CM | POA: Insufficient documentation

## 2020-07-15 DIAGNOSIS — R002 Palpitations: Secondary | ICD-10-CM | POA: Diagnosis not present

## 2020-07-15 DIAGNOSIS — R7303 Prediabetes: Secondary | ICD-10-CM | POA: Insufficient documentation

## 2020-07-15 DIAGNOSIS — K8010 Calculus of gallbladder with chronic cholecystitis without obstruction: Secondary | ICD-10-CM | POA: Insufficient documentation

## 2020-07-15 DIAGNOSIS — Z91048 Other nonmedicinal substance allergy status: Secondary | ICD-10-CM | POA: Diagnosis not present

## 2020-07-15 DIAGNOSIS — Z818 Family history of other mental and behavioral disorders: Secondary | ICD-10-CM | POA: Insufficient documentation

## 2020-07-15 DIAGNOSIS — E559 Vitamin D deficiency, unspecified: Secondary | ICD-10-CM | POA: Insufficient documentation

## 2020-07-15 DIAGNOSIS — F329 Major depressive disorder, single episode, unspecified: Secondary | ICD-10-CM | POA: Insufficient documentation

## 2020-07-15 DIAGNOSIS — Z9109 Other allergy status, other than to drugs and biological substances: Secondary | ICD-10-CM | POA: Diagnosis not present

## 2020-07-15 DIAGNOSIS — I890 Lymphedema, not elsewhere classified: Secondary | ICD-10-CM | POA: Diagnosis not present

## 2020-07-15 DIAGNOSIS — K219 Gastro-esophageal reflux disease without esophagitis: Secondary | ICD-10-CM | POA: Insufficient documentation

## 2020-07-15 DIAGNOSIS — I10 Essential (primary) hypertension: Secondary | ICD-10-CM | POA: Insufficient documentation

## 2020-07-15 DIAGNOSIS — Z811 Family history of alcohol abuse and dependence: Secondary | ICD-10-CM | POA: Insufficient documentation

## 2020-07-15 DIAGNOSIS — D510 Vitamin B12 deficiency anemia due to intrinsic factor deficiency: Secondary | ICD-10-CM | POA: Diagnosis not present

## 2020-07-15 DIAGNOSIS — F603 Borderline personality disorder: Secondary | ICD-10-CM | POA: Insufficient documentation

## 2020-07-15 DIAGNOSIS — I872 Venous insufficiency (chronic) (peripheral): Secondary | ICD-10-CM | POA: Insufficient documentation

## 2020-07-15 DIAGNOSIS — F418 Other specified anxiety disorders: Secondary | ICD-10-CM | POA: Diagnosis not present

## 2020-07-15 DIAGNOSIS — D649 Anemia, unspecified: Secondary | ICD-10-CM | POA: Diagnosis not present

## 2020-07-15 DIAGNOSIS — K760 Fatty (change of) liver, not elsewhere classified: Secondary | ICD-10-CM | POA: Insufficient documentation

## 2020-07-15 DIAGNOSIS — G709 Myoneural disorder, unspecified: Secondary | ICD-10-CM | POA: Insufficient documentation

## 2020-07-15 HISTORY — PX: ROBOTIC ASSISTED LAPAROSCOPIC CHOLECYSTECTOMY: SHX6521

## 2020-07-15 LAB — POCT PREGNANCY, URINE: Preg Test, Ur: NEGATIVE

## 2020-07-15 SURGERY — CHOLECYSTECTOMY, ROBOT-ASSISTED, LAPAROSCOPIC
Anesthesia: General

## 2020-07-15 MED ORDER — OXYCODONE HCL 5 MG/5ML PO SOLN: 5.0000 mg | Freq: Once | ORAL | Status: AC | PRN

## 2020-07-15 MED ORDER — BUPIVACAINE LIPOSOME 1.3 % IJ SUSP: INTRAMUSCULAR | Status: DC | PRN

## 2020-07-15 MED ORDER — FENTANYL CITRATE (PF) 100 MCG/2ML IJ SOLN
INTRAMUSCULAR | Status: AC
  Filled 2020-07-15: qty 2

## 2020-07-15 MED ORDER — DEXMEDETOMIDINE HCL IN NACL 80 MCG/20ML IV SOLN
INTRAVENOUS | Status: AC
  Filled 2020-07-15: qty 20

## 2020-07-15 MED ORDER — PROMETHAZINE HCL 25 MG/ML IJ SOLN: 6.2500 mg | Freq: Once | INTRAMUSCULAR | Status: DC

## 2020-07-15 MED ORDER — ORAL CARE MOUTH RINSE: 15.0000 mL | Freq: Once | OROMUCOSAL | Status: AC

## 2020-07-15 MED ORDER — FENTANYL CITRATE (PF) 100 MCG/2ML IJ SOLN
25.0000 ug | INTRAMUSCULAR | Status: DC | PRN
  Administered 2020-07-15 (×2): 25 ug via INTRAVENOUS

## 2020-07-15 MED ORDER — DEXAMETHASONE SODIUM PHOSPHATE 10 MG/ML IJ SOLN
INTRAMUSCULAR | Status: DC | PRN
  Administered 2020-07-15: 10 mg via INTRAVENOUS

## 2020-07-15 MED ORDER — OXYCODONE HCL 5 MG PO TABS
5.0000 mg | ORAL_TABLET | Freq: Once | ORAL | Status: AC | PRN
  Administered 2020-07-15: 5 mg via ORAL

## 2020-07-15 MED ORDER — FENTANYL CITRATE (PF) 100 MCG/2ML IJ SOLN
INTRAMUSCULAR | Status: DC | PRN
  Administered 2020-07-15 (×2): 50 ug via INTRAVENOUS
  Administered 2020-07-15: 100 ug via INTRAVENOUS

## 2020-07-15 MED ORDER — FENTANYL CITRATE (PF) 100 MCG/2ML IJ SOLN
INTRAMUSCULAR | Status: AC
  Administered 2020-07-15: 50 ug via INTRAVENOUS
  Filled 2020-07-15: qty 2

## 2020-07-15 MED ORDER — CELECOXIB 200 MG PO CAPS
ORAL_CAPSULE | ORAL | Status: AC
  Administered 2020-07-15: 200 mg via ORAL
  Filled 2020-07-15: qty 1

## 2020-07-15 MED ORDER — ROCURONIUM BROMIDE 100 MG/10ML IV SOLN
INTRAVENOUS | Status: DC | PRN
  Administered 2020-07-15: 20 mg via INTRAVENOUS
  Administered 2020-07-15: 10 mg via INTRAVENOUS
  Administered 2020-07-15: 20 mg via INTRAVENOUS
  Administered 2020-07-15: 30 mg via INTRAVENOUS
  Administered 2020-07-15: 20 mg via INTRAVENOUS

## 2020-07-15 MED ORDER — IPRATROPIUM-ALBUTEROL 0.5-2.5 (3) MG/3ML IN SOLN: 3.0000 mL | RESPIRATORY_TRACT | Status: DC

## 2020-07-15 MED ORDER — ONDANSETRON HCL 4 MG/2ML IJ SOLN
INTRAMUSCULAR | Status: AC
  Filled 2020-07-15: qty 2

## 2020-07-15 MED ORDER — PHENYLEPHRINE HCL (PRESSORS) 10 MG/ML IV SOLN
INTRAVENOUS | Status: DC | PRN
  Administered 2020-07-15: 50 ug via INTRAVENOUS

## 2020-07-15 MED ORDER — PROPOFOL 10 MG/ML IV BOLUS
INTRAVENOUS | Status: AC
  Filled 2020-07-15: qty 20

## 2020-07-15 MED ORDER — PROMETHAZINE HCL 25 MG/ML IJ SOLN
INTRAMUSCULAR | Status: AC
  Filled 2020-07-15: qty 1

## 2020-07-15 MED ORDER — LIDOCAINE HCL (PF) 2 % IJ SOLN
INTRAMUSCULAR | Status: AC
  Filled 2020-07-15: qty 5

## 2020-07-15 MED ORDER — MIDAZOLAM HCL 2 MG/2ML IJ SOLN
INTRAMUSCULAR | Status: AC
  Filled 2020-07-15: qty 2

## 2020-07-15 MED ORDER — SUGAMMADEX SODIUM 500 MG/5ML IV SOLN
INTRAVENOUS | Status: DC | PRN
  Administered 2020-07-15: 300 mg via INTRAVENOUS

## 2020-07-15 MED ORDER — ACETAMINOPHEN 500 MG PO TABS
ORAL_TABLET | ORAL | Status: AC
  Administered 2020-07-15: 1000 mg via ORAL
  Filled 2020-07-15: qty 2

## 2020-07-15 MED ORDER — CHLORHEXIDINE GLUCONATE CLOTH 2 % EX PADS: 6.0000 | MEDICATED_PAD | Freq: Once | CUTANEOUS | Status: DC

## 2020-07-15 MED ORDER — MIDAZOLAM HCL 2 MG/2ML IJ SOLN
INTRAMUSCULAR | Status: DC | PRN
  Administered 2020-07-15: 2 mg via INTRAVENOUS

## 2020-07-15 MED ORDER — GLYCOPYRROLATE 0.2 MG/ML IJ SOLN
INTRAMUSCULAR | Status: AC
  Filled 2020-07-15: qty 1

## 2020-07-15 MED ORDER — ROCURONIUM BROMIDE 10 MG/ML (PF) SYRINGE
PREFILLED_SYRINGE | INTRAVENOUS | Status: AC
  Filled 2020-07-15: qty 10

## 2020-07-15 MED ORDER — DEXMEDETOMIDINE HCL 200 MCG/2ML IV SOLN
INTRAVENOUS | Status: DC | PRN
  Administered 2020-07-15 (×5): 4 ug via INTRAVENOUS

## 2020-07-15 MED ORDER — LACTATED RINGERS IV SOLN: INTRAVENOUS | Status: DC

## 2020-07-15 MED ORDER — CHLORHEXIDINE GLUCONATE 0.12 % MT SOLN
OROMUCOSAL | Status: AC
  Administered 2020-07-15: 15 mL via OROMUCOSAL
  Filled 2020-07-15: qty 15

## 2020-07-15 MED ORDER — DEXAMETHASONE SODIUM PHOSPHATE 10 MG/ML IJ SOLN
INTRAMUSCULAR | Status: AC
  Filled 2020-07-15: qty 1

## 2020-07-15 MED ORDER — GABAPENTIN 300 MG PO CAPS
ORAL_CAPSULE | ORAL | Status: AC
  Administered 2020-07-15: 300 mg via ORAL
  Filled 2020-07-15: qty 1

## 2020-07-15 MED ORDER — CELECOXIB 200 MG PO CAPS: 200.0000 mg | ORAL_CAPSULE | ORAL | Status: AC

## 2020-07-15 MED ORDER — IBUPROFEN 800 MG PO TABS
800.0000 mg | ORAL_TABLET | Freq: Three times a day (TID) | ORAL | 0 refills | Status: DC | PRN
Start: 2020-07-15 — End: 2020-09-27

## 2020-07-15 MED ORDER — BUPIVACAINE LIPOSOME 1.3 % IJ SUSP: 20.0000 mL | Freq: Once | INTRAMUSCULAR | Status: DC

## 2020-07-15 MED ORDER — DEXTROSE 5 % IV SOLN
3.0000 g | INTRAVENOUS | Status: AC
  Administered 2020-07-15: 3 g via INTRAVENOUS
  Filled 2020-07-15: qty 3

## 2020-07-15 MED ORDER — IPRATROPIUM-ALBUTEROL 0.5-2.5 (3) MG/3ML IN SOLN
RESPIRATORY_TRACT | Status: AC
  Administered 2020-07-15: 3 mL via RESPIRATORY_TRACT
  Filled 2020-07-15: qty 3

## 2020-07-15 MED ORDER — ACETAMINOPHEN 500 MG PO TABS: 1000.0000 mg | ORAL_TABLET | ORAL | Status: AC

## 2020-07-15 MED ORDER — GLYCOPYRROLATE 0.2 MG/ML IJ SOLN
INTRAMUSCULAR | Status: DC | PRN
  Administered 2020-07-15 (×2): .1 mg via INTRAVENOUS

## 2020-07-15 MED ORDER — BUPIVACAINE-EPINEPHRINE (PF) 0.25% -1:200000 IJ SOLN
INTRAMUSCULAR | Status: AC
  Filled 2020-07-15: qty 30

## 2020-07-15 MED ORDER — SODIUM CHLORIDE FLUSH 0.9 % IV SOLN
INTRAVENOUS | Status: AC
  Filled 2020-07-15: qty 10

## 2020-07-15 MED ORDER — ROCURONIUM BROMIDE 100 MG/10ML IV SOLN: INTRAVENOUS | Status: DC | PRN

## 2020-07-15 MED ORDER — SUCCINYLCHOLINE CHLORIDE 20 MG/ML IJ SOLN
INTRAMUSCULAR | Status: DC | PRN
  Administered 2020-07-15: 200 mg via INTRAVENOUS

## 2020-07-15 MED ORDER — BUPIVACAINE LIPOSOME 1.3 % IJ SUSP
INTRAMUSCULAR | Status: AC
  Filled 2020-07-15: qty 20

## 2020-07-15 MED ORDER — PROPOFOL 10 MG/ML IV BOLUS
INTRAVENOUS | Status: DC | PRN
  Administered 2020-07-15: 200 mg via INTRAVENOUS

## 2020-07-15 MED ORDER — BUPIVACAINE-EPINEPHRINE (PF) 0.25% -1:200000 IJ SOLN
INTRAMUSCULAR | Status: DC | PRN
  Administered 2020-07-15: 30 mL

## 2020-07-15 MED ORDER — HYDROCODONE-ACETAMINOPHEN 5-325 MG PO TABS
1.0000 | ORAL_TABLET | Freq: Four times a day (QID) | ORAL | 0 refills | Status: DC | PRN
Start: 2020-07-15 — End: 2020-08-05

## 2020-07-15 MED ORDER — INDOCYANINE GREEN 25 MG IV SOLR
2.5000 mg | Freq: Once | INTRAVENOUS | Status: AC
  Administered 2020-07-15: 2.5 mg via INTRAVENOUS
  Filled 2020-07-15: qty 10

## 2020-07-15 MED ORDER — CHLORHEXIDINE GLUCONATE 0.12 % MT SOLN: 15.0000 mL | Freq: Once | OROMUCOSAL | Status: AC

## 2020-07-15 MED ORDER — LIDOCAINE HCL (CARDIAC) PF 100 MG/5ML IV SOSY
PREFILLED_SYRINGE | INTRAVENOUS | Status: DC | PRN
  Administered 2020-07-15: 100 mg via INTRAVENOUS

## 2020-07-15 MED ORDER — OXYCODONE HCL 5 MG PO TABS
ORAL_TABLET | ORAL | Status: AC
  Filled 2020-07-15: qty 1

## 2020-07-15 MED ORDER — GABAPENTIN 300 MG PO CAPS: 300.0000 mg | ORAL_CAPSULE | ORAL | Status: AC

## 2020-07-15 MED ORDER — SUGAMMADEX SODIUM 500 MG/5ML IV SOLN
INTRAVENOUS | Status: AC
  Filled 2020-07-15: qty 5

## 2020-07-15 MED ORDER — SUCCINYLCHOLINE CHLORIDE 200 MG/10ML IV SOSY
PREFILLED_SYRINGE | INTRAVENOUS | Status: AC
  Filled 2020-07-15: qty 10

## 2020-07-15 MED ORDER — ONDANSETRON HCL 4 MG/2ML IJ SOLN
INTRAMUSCULAR | Status: DC | PRN
  Administered 2020-07-15: 4 mg via INTRAVENOUS

## 2020-07-15 SURGICAL SUPPLY — 49 items
ADH SKN CLS APL DERMABOND .7 (GAUZE/BANDAGES/DRESSINGS) ×1
APL PRP STRL LF DISP 70% ISPRP (MISCELLANEOUS) ×1
BAG SPEC RTRVL LRG 6X4 10 (ENDOMECHANICALS) ×1
CANISTER SUCT 1200ML W/VALVE (MISCELLANEOUS) ×2 IMPLANT
CHLORAPREP W/TINT 26 (MISCELLANEOUS) ×2 IMPLANT
CLIP VESOLOCK LG 6/CT PURPLE (CLIP) ×2 IMPLANT
CLIP VESOLOCK MED LG 6/CT (CLIP) ×2 IMPLANT
COVER TIP SHEARS 8 DVNC (MISCELLANEOUS) ×1 IMPLANT
COVER TIP SHEARS 8MM DA VINCI (MISCELLANEOUS) ×1
COVER WAND RF STERILE (DRAPES) ×2 IMPLANT
DECANTER SPIKE VIAL GLASS SM (MISCELLANEOUS) ×2 IMPLANT
DEFOGGER SCOPE WARMER CLEARIFY (MISCELLANEOUS) ×2 IMPLANT
DERMABOND ADVANCED (GAUZE/BANDAGES/DRESSINGS) ×1
DERMABOND ADVANCED .7 DNX12 (GAUZE/BANDAGES/DRESSINGS) ×1 IMPLANT
DRAPE ARM DVNC X/XI (DISPOSABLE) ×4 IMPLANT
DRAPE COLUMN DVNC XI (DISPOSABLE) ×1 IMPLANT
DRAPE DA VINCI XI ARM (DISPOSABLE) ×4
DRAPE DA VINCI XI COLUMN (DISPOSABLE) ×1
ELECT CAUTERY BLADE 6.4 (BLADE) ×2 IMPLANT
GLOVE ORTHO TXT STRL SZ7.5 (GLOVE) ×4 IMPLANT
GOWN STRL REUS W/ TWL LRG LVL3 (GOWN DISPOSABLE) ×4 IMPLANT
GOWN STRL REUS W/TWL LRG LVL3 (GOWN DISPOSABLE) ×8
GRASPER SUT TROCAR 14GX15 (MISCELLANEOUS) IMPLANT
INFUSOR MANOMETER BAG 3000ML (MISCELLANEOUS) IMPLANT
IRRIGATION STRYKERFLOW (MISCELLANEOUS) ×1 IMPLANT
IRRIGATOR STRYKERFLOW (MISCELLANEOUS) ×2
IRRIGATOR SUCT 8 DISP DVNC XI (IRRIGATION / IRRIGATOR) IMPLANT
IRRIGATOR SUCTION 8MM XI DISP (IRRIGATION / IRRIGATOR)
IV NS IRRIG 3000ML ARTHROMATIC (IV SOLUTION) IMPLANT
KIT PINK PAD W/HEAD ARE REST (MISCELLANEOUS) ×2
KIT PINK PAD W/HEAD ARM REST (MISCELLANEOUS) ×1 IMPLANT
KIT TURNOVER KIT A (KITS) ×2 IMPLANT
LABEL OR SOLS (LABEL) ×2 IMPLANT
NEEDLE HYPO 22GX1.5 SAFETY (NEEDLE) ×2 IMPLANT
NEEDLE INSUFFLATION 14GA 120MM (NEEDLE) IMPLANT
NEEDLE SPNL 20GX3.5 QUINCKE YW (NEEDLE) ×2 IMPLANT
NS IRRIG 500ML POUR BTL (IV SOLUTION) ×2 IMPLANT
PACK LAP CHOLECYSTECTOMY (MISCELLANEOUS) ×2 IMPLANT
PENCIL ELECTRO HAND CTR (MISCELLANEOUS) ×2 IMPLANT
POUCH SPECIMEN RETRIEVAL 10MM (ENDOMECHANICALS) ×2 IMPLANT
SEAL CANN UNIV 5-8 DVNC XI (MISCELLANEOUS) ×4 IMPLANT
SEAL XI 5MM-8MM UNIVERSAL (MISCELLANEOUS) ×4
SET TUBE SMOKE EVAC HIGH FLOW (TUBING) ×2 IMPLANT
SOLUTION ELECTROLUBE (MISCELLANEOUS) ×2 IMPLANT
SUT MNCRL 4-0 (SUTURE) ×2
SUT MNCRL 4-0 27XMFL (SUTURE) ×1
SUT VICRYL 0 AB UR-6 (SUTURE) ×4 IMPLANT
SUTURE MNCRL 4-0 27XMF (SUTURE) ×1 IMPLANT
TROCAR Z-THREAD FIOS 11X100 BL (TROCAR) ×2 IMPLANT

## 2020-07-15 NOTE — Transfer of Care (Signed)
Immediate Anesthesia Transfer of Care Note  Patient: Amanda Webb  Procedure(s) Performed: XI ROBOTIC ASSISTED LAPAROSCOPIC CHOLECYSTECTOMY (N/A )  Patient Location: PACU  Anesthesia Type:General  Level of Consciousness: drowsy  Airway & Oxygen Therapy: Patient Spontanous Breathing and Patient connected to face mask oxygen  Post-op Assessment: Report given to RN and Post -op Vital signs reviewed and stable  Post vital signs: Reviewed and stable  Last Vitals:  Vitals Value Taken Time  BP 115/54 07/15/20 1011  Temp 36 C 07/15/20 1011  Pulse 71 07/15/20 1014  Resp 22 07/15/20 1014  SpO2 91 % 07/15/20 1014  Vitals shown include unvalidated device data.  Last Pain:  Vitals:   07/15/20 0618  TempSrc: Tympanic  PainSc: 0-No pain         Complications: No complications documented.

## 2020-07-15 NOTE — Discharge Instructions (Signed)

## 2020-07-15 NOTE — Interval H&P Note (Signed)
History and Physical Interval Note:  07/15/2020 7:22 AM  Amanda Webb  has presented today for surgery, with the diagnosis of Chronic calculus cholecystitis.  The various methods of treatment have been discussed with the patient and family. After consideration of risks, benefits and other options for treatment, the patient has consented to  Procedure(s): XI ROBOTIC [place] (N/A) as a surgical intervention.  The patient's history has been reviewed, patient examined, no change in status, stable for surgery.  I have reviewed the patient's chart and labs.  Questions were answered to the patient's satisfaction.     Ronny Bacon

## 2020-07-15 NOTE — Op Note (Signed)
Robotic cholecystectomy  Pre-operative Diagnosis: Chronic calculus cholecystitis  Post-operative Diagnosis:  Same.  Procedure: Robotic assisted laparoscopic cholecystectomy.  Surgeon: Ronny Bacon, M.D., FACS  Anesthesia: General. with endotracheal tube  Findings: As expected, minimal adhesive disease, remarkable fatty infiltration around the gallbladder neck and cystic duct and triangle of Calot.  Clips applied on neck of gallbladder to ensure no compromise of what appears to be a short cystic duct.  Estimated Blood Loss: 10 mL         Drains: None         Specimens: Gallbladder           Complications: none  Procedure Details  The patient was seen again in the Holding Room.  1.25 mg dose of ICG was administered intravenously.  The benefits, complications, treatment options, risks and expected outcomes were discussed with the patient. The likelihood of improving the patient's symptoms with return to their baseline status is good.  The patient and/or family concurred with the proposed plan, giving informed consent, again alternatives reviewed.  The patient was taken to Operating Room, identified, and the procedure verified as robotic assisted laparoscopic cholecystectomy.  Prior to the induction of general anesthesia, antibiotic prophylaxis was administered. VTE prophylaxis was in place. General endotracheal anesthesia was then administered and tolerated well. The patient was positioned in the supine position.  After the induction, the abdomen was prepped with Chloraprep and draped in the sterile fashion.  A Time Out was held and the above information confirmed.  After local infiltration of quarter percent Marcaine with epinephrine, stab incision was made left upper quadrant.  Just below the costal margin approximately midclavicular line the Veres needle is passed with sensation of the layers to penetrate the abdominal wall and into the peritoneum.  Saline drop test is confirmed  peritoneal placement.  Insufflation is initiated with carbon dioxide to pressures of 15 mmHg.  Right infra-umbilical local infiltration with quarter percent Marcaine with epinephrine is utilized.  Made a 12 mm incision on the left periumbilical site, I advanced an optical 82mm port under direct visualization into the peritoneal cavity.  Once the peritoneum was penetrated, insufflation was transferred.  The trocar was then advanced into the abdominal cavity under direct visualization. Pneumoperitoneum was then continued with CO2 at 14 mmHg or less and tolerated well without any adverse changes in the patient's vital signs.  Two 8.5-mm ports were placed in the left lower quadrant and laterally, and one to the right lower quadrant, all under direct vision. All skin incisions  were infiltrated with a local anesthetic agent before making the incision and placing the trocars.  The patient was positioned  in reverse Trendelenburg, tilted the patient's left side down.  Da Vinci XI robot was then positioned on to the patient's left side, and docked.  The gallbladder was identified, the fundus grasped via the arm 4 Prograsp and retracted cephalad. Adhesions were lysed with scissors and cautery.  The infundibulum was identified grasped and retracted laterally, exposing the peritoneum overlying the triangle of Calot. This was then opened and dissected using cautery & scissors. An extended critical view of the cystic duct was obtained, adjacent lymph node was dissected away utilizing hot scissors and aided by the ICG via FireFly which enabled ready visualization of the ductal anatomy.    The cystic duct/gallbladder neck was clearly identified and dissected to isolation. The cystic duct was double clipped and divided with scissors, leaving two on the remaining stump.   The gallbladder was taken from  the gallbladder fossa in a retrograde fashion with the electrocautery. The gallbladder was removed and placed in an  Endocatch bag.  The liver bed was irrigated and inspected. Hemostasis was achieved with the electrocautery.  The robot was undocked and moved away from the operative field. Copious irrigation was utilized and was repeatedly aspirated until clear.  The gallbladder and Endocatch sac were then removed through the infraumbilical port site.   Inspection of the right upper quadrant was performed. No bleeding, bile duct injury or leak, or bowel injury was noted. The infra-umbilical port site fascia was closed with interrumpted 0 Vicryl sutures using PMI/cone under direct visualization. Pneumoperitoneum was released and ports removed.  4-0 subcuticular Monocryl was used to close the skin. Dermabond was  applied.  The patient was then extubated and brought to the recovery room in stable condition. Sponge, lap, and needle counts were correct at closure and at the conclusion of the case.               Ronny Bacon, M.D., [facility] 07/15/2020 10:03 AM

## 2020-07-15 NOTE — Anesthesia Postprocedure Evaluation (Signed)
Anesthesia Post Note  Patient: [place]  Procedure(s) Performed: XI ROBOTIC ASSISTED LAPAROSCOPIC CHOLECYSTECTOMY (N/A )  Patient location during evaluation: PACU Anesthesia Type: General Level of consciousness: awake and alert Pain management: pain level controlled Vital Signs Assessment: post-procedure vital signs reviewed and stable Respiratory status: spontaneous breathing, nonlabored ventilation, respiratory function stable and patient connected to nasal cannula oxygen Cardiovascular status: blood pressure returned to baseline and stable Postop Assessment: no apparent nausea or vomiting Anesthetic complications: no   No complications documented.   Last Vitals:  Vitals:   07/15/20 1113 07/15/20 1153  BP: 110/71 (!) 123/59  Pulse: 72 76  Resp:  16  Temp:  36.8 C  SpO2: (!) 89% 90%    Last Pain:  Vitals:   07/15/20 1153  TempSrc: Temporal  PainSc: 5                  Precious Haws Sekai Nayak

## 2020-07-15 NOTE — Anesthesia Procedure Notes (Signed)
Procedure Name: Intubation Date/Time: 07/15/2020 7:38 AM Performed by: Lily Peer, [place], RN Pre-anesthesia Checklist: Patient identified, Emergency Drugs available, Suction available and Patient being monitored Patient Re-evaluated:Patient Re-evaluated prior to induction Oxygen Delivery Method: Circle system utilized Preoxygenation: Pre-oxygenation with 100% oxygen Induction Type: IV induction Ventilation: Mask ventilation without difficulty Laryngoscope Size: McGraph and 3 Grade View: Grade I Tube type: Oral Number of attempts: 1 Airway Equipment and Method: Stylet Placement Confirmation: ETT inserted through vocal cords under direct vision,  positive ETCO2 and breath sounds checked- equal and bilateral Secured at: 21 cm Tube secured with: Tape Dental Injury: Teeth and Oropharynx as per pre-operative assessment

## 2020-07-15 NOTE — Anesthesia Preprocedure Evaluation (Signed)
Anesthesia Evaluation  Patient identified by MRN, date of birth, ID band Patient awake    Reviewed: Allergy & Precautions, H&P , NPO status , Patient's Chart, lab work & pertinent test results  History of Anesthesia Complications Negative for: history of anesthetic complications  Airway Mallampati: III  TM Distance: <3 FB Neck ROM: full    Dental  (+) Chipped, Poor Dentition   Pulmonary neg shortness of breath, sleep apnea , Current Smoker and Patient abstained from smoking.,    Pulmonary exam normal        Cardiovascular Exercise Tolerance: Good hypertension, (-) angina(-) DOE Normal cardiovascular exam+ dysrhythmias + Valvular Problems/Murmurs      Neuro/Psych  Headaches, PSYCHIATRIC DISORDERS  Neuromuscular disease negative psych ROS   GI/Hepatic Neg liver ROS, GERD  Medicated and Controlled,  Endo/Other  Hypothyroidism   Renal/GU Renal disease     Musculoskeletal  (+) Arthritis ,   Abdominal   Peds  Hematology negative hematology ROS (+)   Anesthesia Other Findings Past Medical History: No date: Allergy No date: Anemia No date: Anxiety No date: Arrhythmia No date: Arthritis No date: Cervical dysplasia No date: Cystitis No date: Depression No date: GERD (gastroesophageal reflux disease) No date: Gross hematuria No date: Headache No date: Heart murmur     Comment:  asymptomatic 2013: History of methicillin resistant staphylococcus aureus (MRSA) No date: HLD (hyperlipidemia) No date: Hypertension No date: Hypothyroid No date: Lymphedema No date: Palpitations No date: Pernicious anemia No date: Tobacco abuse  Past Surgical History: 2011: CARPAL TUNNEL RELEASE; Bilateral 2013: FOOT SURGERY; Right     Comment:  Plantar fascia 1979: TONSILLECTOMY     Comment:  with ear tubes  BMI    Body Mass Index: 42.91 kg/m      Reproductive/Obstetrics negative OB ROS                              Anesthesia Physical Anesthesia Plan  ASA: III  Anesthesia Plan: General ETT   Post-op Pain Management:    Induction: Intravenous  PONV Risk Score and Plan: Ondansetron, Dexamethasone, Midazolam and Treatment may vary due to age or medical condition  Airway Management Planned: Oral ETT  Additional Equipment:   Intra-op Plan:   Post-operative Plan: Extubation in OR  Informed Consent: I have reviewed the patients History and Physical, chart, labs and discussed the procedure including the risks, benefits and alternatives for the proposed anesthesia with the patient or authorized representative who has indicated his/her understanding and acceptance.     Dental Advisory Given  Plan Discussed with: Anesthesiologist, CRNA and Surgeon  Anesthesia Plan Comments: (Patient consented for risks of anesthesia including but not limited to:  - adverse reactions to medications - damage to eyes, teeth, lips or other oral mucosa - nerve damage due to positioning  - sore throat or hoarseness - Damage to heart, brain, nerves, lungs, other parts of body or loss of life  Patient voiced understanding.)        Anesthesia Quick Evaluation

## 2020-07-17 ENCOUNTER — Encounter: Payer: Self-pay | Admitting: Surgery

## 2020-07-18 ENCOUNTER — Encounter: Payer: Self-pay | Admitting: Obstetrics and Gynecology

## 2020-07-18 ENCOUNTER — Telehealth: Payer: Self-pay | Admitting: Surgery

## 2020-07-18 ENCOUNTER — Telehealth: Payer: Self-pay

## 2020-07-18 LAB — SURGICAL PATHOLOGY

## 2020-07-18 NOTE — Telephone Encounter (Signed)
Patient scheduled Wednesday 07/20/20 with Dr.Piscoya.

## 2020-07-18 NOTE — Telephone Encounter (Signed)
Please have pt see one of the providers this week.

## 2020-07-18 NOTE — Telephone Encounter (Signed)
Pt called after sending numerous MyChart msgs last week (both pre & post op) to seek some advise.  She is s/p Lap Chole w/Dr. Christian Mate (07/30), however has a few concerns @ this time.   1)  Temp is "all over the place" ranging from 97.5 to 99.7 since sx.     ~ Pt was encouraged to keep a log, however @ this time the degrees w/in that range are not concerning. 2)  Pain where her gallbladder used to be; pain/tenderness around her belly button (sitting still is a 0 on 1-10 scale & 6 when taking deep breaths).   ~ Pt was reminded that she just had surgery & her body is learning/adapting to no longer having a gallbladder.  As well, even though her surgery was robotic/laparoscopic, it was still surgery & that will require healing. 3)  Bilateral upper/lower swelling/edema.  Pt states she has had minimal movement & minimal water intake, mainly soda.     ~ Pt was encouraged to get up & move every hour to hour & a half (walk around her house) to get the blood flowing & decrease swelling.  She was also encouraged to minimize soda & increase her water intake.  Pt was reminded that although she needs to get up & move to help w/the healing process, she is not to lift, push or pull anything greater than 5-10 lbs (no heaver than a gallon of milk/juice).  Also, if she felt she needed added/extra security, she can get a belly binder. 4)  Can she take a bath or shower?   ~ Pt was encouraged not to soak in the bath tub, however she can get in the shower, keeping her back to the water or remove the shower head (if available) & remember to pat dry DO NOT WIPE; paying close attention to the belly button so that water does not pool in that area.  The pt acknowledged understanding of all the info provided & is aware that a member of the clinical team will be in touch w/her by lunch today to confirm the info provided, along w/any additional questions or concerns she may have.  Shanon is aware that the clinic is short staffed this  morning, so her call may not be returned until lunch.  She is best reached @ [phone].  Thank you   * I attempted to log this call w/one of the current/open encounters, however because they are MyChart, I am unable to do so.

## 2020-07-19 ENCOUNTER — Other Ambulatory Visit: Payer: Self-pay

## 2020-07-19 ENCOUNTER — Encounter: Payer: Self-pay | Admitting: Emergency Medicine

## 2020-07-19 DIAGNOSIS — R509 Fever, unspecified: Secondary | ICD-10-CM | POA: Diagnosis not present

## 2020-07-19 DIAGNOSIS — Z5321 Procedure and treatment not carried out due to patient leaving prior to being seen by health care provider: Secondary | ICD-10-CM | POA: Insufficient documentation

## 2020-07-19 DIAGNOSIS — R1011 Right upper quadrant pain: Secondary | ICD-10-CM | POA: Insufficient documentation

## 2020-07-19 DIAGNOSIS — R0789 Other chest pain: Secondary | ICD-10-CM | POA: Insufficient documentation

## 2020-07-19 DIAGNOSIS — R2243 Localized swelling, mass and lump, lower limb, bilateral: Secondary | ICD-10-CM | POA: Insufficient documentation

## 2020-07-19 LAB — CBC WITH DIFFERENTIAL/PLATELET
Abs Immature Granulocytes: 0.07 10*3/uL (ref 0.00–0.07)
Basophils Absolute: 0 10*3/uL (ref 0.0–0.1)
Basophils Relative: 0 %
Eosinophils Absolute: 0.2 10*3/uL (ref 0.0–0.5)
Eosinophils Relative: 2 %
HCT: 42.4 % (ref 36.0–46.0)
Hemoglobin: 14.4 g/dL (ref 12.0–15.0)
Immature Granulocytes: 1 %
Lymphocytes Relative: 24 %
Lymphs Abs: 2.5 10*3/uL (ref 0.7–4.0)
MCH: 31.9 pg (ref 26.0–34.0)
MCHC: 34 g/dL (ref 30.0–36.0)
MCV: 94 fL (ref 80.0–100.0)
Monocytes Absolute: 0.6 10*3/uL (ref 0.1–1.0)
Monocytes Relative: 5 %
Neutro Abs: 7.1 10*3/uL (ref 1.7–7.7)
Neutrophils Relative %: 68 %
Platelets: 285 10*3/uL (ref 150–400)
RBC: 4.51 MIL/uL (ref 3.87–5.11)
RDW: 12.5 % (ref 11.5–15.5)
WBC: 10.5 10*3/uL (ref 4.0–10.5)
nRBC: 0 % (ref 0.0–0.2)

## 2020-07-19 LAB — URINALYSIS, COMPLETE (UACMP) WITH MICROSCOPIC
Bacteria, UA: NONE SEEN
Bilirubin Urine: NEGATIVE
Glucose, UA: NEGATIVE mg/dL
Ketones, ur: NEGATIVE mg/dL
Leukocytes,Ua: NEGATIVE
Nitrite: NEGATIVE
Protein, ur: NEGATIVE mg/dL
Specific Gravity, Urine: 1.003 — ABNORMAL LOW (ref 1.005–1.030)
pH: 7 (ref 5.0–8.0)

## 2020-07-19 LAB — COMPREHENSIVE METABOLIC PANEL
ALT: 33 U/L (ref 0–44)
AST: 22 U/L (ref 15–41)
Albumin: 4.3 g/dL (ref 3.5–5.0)
Alkaline Phosphatase: 70 U/L (ref 38–126)
Anion gap: 12 (ref 5–15)
BUN: 6 mg/dL (ref 6–20)
CO2: 28 mmol/L (ref 22–32)
Calcium: 9.2 mg/dL (ref 8.9–10.3)
Chloride: 102 mmol/L (ref 98–111)
Creatinine, Ser: 0.68 mg/dL (ref 0.44–1.00)
GFR calc Af Amer: >60 mL/min (ref >60)
GFR calc non Af Amer: >60 mL/min (ref >60)
Glucose, Bld: 115 mg/dL — ABNORMAL HIGH (ref 70–99)
Potassium: 3.4 mmol/L — ABNORMAL LOW (ref 3.5–5.1)
Sodium: 142 mmol/L (ref 135–145)
Total Bilirubin: 0.7 mg/dL (ref 0.3–1.2)
Total Protein: 8.3 g/dL — ABNORMAL HIGH (ref 6.5–8.1)

## 2020-07-19 LAB — POCT PREGNANCY, URINE: Preg Test, Ur: NEGATIVE

## 2020-07-19 LAB — TROPONIN I (HIGH SENSITIVITY): Troponin I (High Sensitivity): 5 ng/L (ref <18)

## 2020-07-19 NOTE — ED Triage Notes (Addendum)
Pt presents to ED with chest pain, fever of 99.9, RUQ abd pain, bilateral swelling to lower extremities after gallbladder removal Friday at [facility]. Onset of symptoms yesterday afternoon. Pt tearful in triage and states she is scared because she knows something is wrong and she is afraid she is going to die. abd not tender to touch but pt states she "does feel like something is in there".  Denies vomiting.

## 2020-07-20 ENCOUNTER — Encounter: Payer: Self-pay | Admitting: Surgery

## 2020-07-20 ENCOUNTER — Emergency Department
Admission: EM | Admit: 2020-07-20 | Discharge: 2020-07-20 | Disposition: A | Discharge status: Home / Self Care | Payer: Medicare Other | Attending: Emergency Medicine | Admitting: Emergency Medicine

## 2020-07-20 ENCOUNTER — Ambulatory Visit (INDEPENDENT_AMBULATORY_CARE_PROVIDER_SITE_OTHER): Payer: Self-pay | Admitting: Surgery

## 2020-07-20 ENCOUNTER — Other Ambulatory Visit: Payer: Self-pay

## 2020-07-20 VITALS — BP 158/87 | HR 86 | Temp 99.7°F | Resp 12 | Ht 67.0 in | Wt 274.4 lb

## 2020-07-20 DIAGNOSIS — G8929 Other chronic pain: Secondary | ICD-10-CM

## 2020-07-20 DIAGNOSIS — R1011 Right upper quadrant pain: Secondary | ICD-10-CM

## 2020-07-20 DIAGNOSIS — Z09 Encounter for follow-up examination after completed treatment for conditions other than malignant neoplasm: Secondary | ICD-10-CM

## 2020-07-20 DIAGNOSIS — K8010 Calculus of gallbladder with chronic cholecystitis without obstruction: Secondary | ICD-10-CM

## 2020-07-20 NOTE — ED Notes (Signed)
No answer when called several times from lobby 

## 2020-07-20 NOTE — Progress Notes (Signed)
07/20/2020  HPI: Amanda Webb is a 49 y.o. female s/p robotic assisted cholecystectomy with Dr. Christian Mate on 07/15/20 for chronic cholecystitis.  She presents today because of persistent RUQ pain, low grade temp at home as high as 99.9, feeling very fatigued, having loose stools, having bilateral lower extremity > upper extremity swelling.  She presented yesterday to the ER but she left before being seen as the wait time was too long.  She did have labs drawn which are overall normal.  She also mentions that her blood pressure has been more elevated.  She also mentions that she can feel something is moving in the RUQ when she turns, and is worried about the "clamps" that were used that she has read about.  Vital signs: BP (!) 158/87   Pulse 86   Temp 99.7 F (37.6 C)   Resp 12   Ht 5\' 7"  (1.702 m)   Wt 274 lb 6.4 oz (124.5 kg)   SpO2 96%   BMI 42.98 kg/m    Physical Exam: Constitutional:  No acute distress Abdomen:  Soft, obese, non-distended, with some tenderness to palpation in the RUQ.  The incisions are healing well, clean, dry, intact, with some appropriate soreness and minimal ecchymosis.  No peritonitis.  Assessment/Plan: This is a 49 y.o. female s/p robotic assisted cholecystectomy on 07/15/20.  --Discussed with the patient that these symptoms could be normal part of the post-operative recovery from her surgery.  I think her loose stools, fatigue, and swelling can be related to her surgery as well as decreased mobility at home.  I discussed that typically patients may have bloatedness and loose stools for about 2-3 weeks while the body adjusts after cholecystectomy.  She can walk more to help improve the swelling, but reminded her of no heavy lifting or pushing for 4 weeks.  Her fatigue will improve with time as her body recovers from the surgery, but it would not be surprising to need an afternoon nap initially. --With regards to her pain and low grade temperatures, I discussed  with her that sometimes if the pain is enough, she may not be taking as deep breaths and that may contribute to some low grade temperature elevation.  Her blood pressure can also rise due to pain issues and the anxiety that all her symptoms are producing.  She can continue taking Tylenol and Ibuprofen for pain control. --Nonetheless, given the RUQ pain being worse than the incisions, I will order an U/S to further evaluate.  I will call the patient with the results.  No new labs are needed.  Her WBC was 10.5 with no shift yesterday and her LFTs were all normal.  If everything appears normal on imaging, then she can follow up with Dr. Christian Mate next week as currently scheduled.     Melvyn Neth, [facility]

## 2020-07-20 NOTE — Patient Instructions (Addendum)
Your Ultrasound appointment is scheduled for 07/21/20. Arrival time 8:30am at the [facility]. Nothing to eat or drink 6 hours before.  Dr Hampton Abbot will call you with results. See your appointment below. Call the office if you have any questions or concerns.   GENERAL POST-OPERATIVE PATIENT INSTRUCTIONS   WOUND CARE INSTRUCTIONS:  Keep a dry clean dressing on the wound if there is drainage. The initial bandage may be removed after 24 hours.  Once the wound has quit draining you may leave it open to air.  If clothing rubs against the wound or causes irritation and the wound is not draining you may cover it with a dry dressing during the daytime.  Try to keep the wound dry and avoid ointments on the wound unless directed to do so.  If the wound becomes bright red and painful or starts to drain infected material that is not clear, please contact your physician immediately.  If the wound is mildly pink and has a thick firm ridge underneath it, this is normal, and is referred to as a healing ridge.  This will resolve over the next 4-6 weeks.  BATHING: You may shower if you have been informed of this by your surgeon. However, Please do not submerge in a tub, hot tub, or pool until incisions are completely sealed or have been told by your surgeon that you may do so.  DIET:  You may eat any foods that you can tolerate.  It is a good idea to eat a high fiber diet and take in plenty of fluids to prevent constipation.  If you do become constipated you may want to take a mild laxative or take ducolax tablets on a daily basis until your bowel habits are regular.  Constipation can be very uncomfortable, along with straining, after recent surgery.  ACTIVITY:  You are encouraged to cough and deep breath or use your incentive spirometer if you were given one, every 15-30 minutes when awake.  This will help prevent respiratory complications and low grade fevers post-operatively if you had a general  anesthetic.  You may want to hug a pillow when coughing and sneezing to add additional support to the surgical area, if you had abdominal or chest surgery, which will decrease pain during these times.  You are encouraged to walk and engage in light activity for the next two weeks.  You should not lift more than 20 pounds four to six weeks after surgery as it could put you at increased risk for complications.  Twenty pounds is roughly equivalent to a plastic bag of groceries. At that time- Listen to your body when lifting, if you have pain when lifting, stop and then try again in a few days. Soreness after doing exercises or activities of daily living is normal as you get back in to your normal routine.  MEDICATIONS:  Try to take narcotic medications and anti-inflammatory medications, such as tylenol, ibuprofen, naprosyn, etc., with food.  This will minimize stomach upset from the medication.  Should you develop nausea and vomiting from the pain medication, or develop a rash, please discontinue the medication and contact your physician.  You should not drive, make important decisions, or operate machinery when taking narcotic pain medication.  SUNBLOCK Use sun block to incision area over the next year if this area will be exposed to sun. This helps decrease scarring and will allow you avoid a permanent darkened area over your incision.  QUESTIONS:  Please feel free to  call our office if you have any questions, and we will be glad to assist you.

## 2020-07-21 ENCOUNTER — Encounter: Payer: Self-pay | Admitting: Surgery

## 2020-07-21 ENCOUNTER — Ambulatory Visit
Admission: RE | Admit: 2020-07-21 | Discharge: 2020-07-21 | Disposition: A | Discharge status: Home / Self Care | Payer: Medicare Other | Source: Ambulatory Visit | Attending: Surgery | Admitting: Surgery

## 2020-07-21 ENCOUNTER — Other Ambulatory Visit: Payer: Self-pay

## 2020-07-21 DIAGNOSIS — G8929 Other chronic pain: Secondary | ICD-10-CM | POA: Diagnosis not present

## 2020-07-21 DIAGNOSIS — R1011 Right upper quadrant pain: Secondary | ICD-10-CM | POA: Insufficient documentation

## 2020-07-21 DIAGNOSIS — K7689 Other specified diseases of liver: Secondary | ICD-10-CM | POA: Diagnosis not present

## 2020-07-23 ENCOUNTER — Encounter: Payer: Self-pay | Admitting: Surgery

## 2020-07-25 ENCOUNTER — Telehealth: Payer: Self-pay

## 2020-07-25 ENCOUNTER — Other Ambulatory Visit: Payer: Self-pay | Admitting: Family Medicine

## 2020-07-25 DIAGNOSIS — L219 Seborrheic dermatitis, unspecified: Secondary | ICD-10-CM

## 2020-07-25 NOTE — Telephone Encounter (Signed)
Patient stated she stretched and one of the small incisions opened and bled a little. She has placed a band aid over it. Denies and redness or fever or pain. Instructed to wash with soap and water and keep covered and to call if it worsens.

## 2020-07-25 NOTE — Telephone Encounter (Signed)
error 

## 2020-07-26 ENCOUNTER — Ambulatory Visit: Payer: Medicare Other

## 2020-07-28 ENCOUNTER — Encounter: Payer: Self-pay | Admitting: Surgery

## 2020-07-28 ENCOUNTER — Other Ambulatory Visit: Payer: Self-pay

## 2020-07-28 ENCOUNTER — Ambulatory Visit (INDEPENDENT_AMBULATORY_CARE_PROVIDER_SITE_OTHER): Payer: Self-pay | Admitting: Surgery

## 2020-07-28 VITALS — BP 140/83 | HR 79 | Temp 99.4°F | Ht 67.0 in | Wt 276.6 lb

## 2020-07-28 DIAGNOSIS — Z9049 Acquired absence of other specified parts of digestive tract: Secondary | ICD-10-CM | POA: Insufficient documentation

## 2020-07-28 NOTE — Progress Notes (Signed)
[facility] POST-OP OFFICE VISIT  07/28/2020  HPI: Amanda Webb is a 49 y.o. female 13 days s/p robotic cholecystectomy.  She returned to the ER about 5 days postop and subsequently was seen by Dr. Hampton Abbot in the office, and ultrasound revealed some fluid in the right upper quadrant which was rather nonspecific.  She reports that abdominal pain had resolved.  She had one of her laparoscopic incisions open.  She reports increased swelling in her lower extremities.  She complains of bloating.  She denies constipation and diarrhea.  She reports her appetite has not returned to normal.  She denies any pain with with eating.  Vital signs: BP 140/83   Pulse 79   Temp 99.4 F (37.4 C) (Oral)   Ht 5\' 7"  (1.702 m)   Wt 276 lb 9.6 oz (125.5 kg)   SpO2 96%   BMI 43.32 kg/m    Physical Exam: Constitutional: She appears well today, her sclera are anicteric. Abdomen: All incisions are clean dry and intact with the exception of the lowest left lower quadrant, it has a slight gap with a millimeter of subcutaneous tissues exposed and dry.  There is no appreciable mass or ecchymosis, no erythema.  We applied triple antibiotic ointment to the site and replaced her Band-Aid.  Otherwise her abdomen is soft and nontender.  Assessment/Plan: This is a 49 y.o. female 13 days s/p robotic cholecystectomy, she is making fairly good progress considering the obstacles involved, she recalls the degree of anxiety she had preop, and rejoice is in the fact that she survived.  Patient Active Problem List   Diagnosis Date Noted  . CCC (chronic calculous cholecystitis) 07/05/2020  . Morbid obesity ([place]) 07/05/2020  . Hepatic steatosis 06/23/2020  . Lymphedema 03/09/2020  . Chronic venous insufficiency 03/09/2020  . Bilateral lower extremity edema 02/16/2020  . Female cystocele 10/21/2018  . Urinary incontinence, mixed 10/21/2018  . B12 deficiency 04/08/2018  . Iron deficiency anemia 03/14/2018   . GAD (generalized anxiety disorder) 03/10/2018  . Acute right-sided low back pain 10/28/2017  . Tobacco abuse 05/09/2017  . Multiple thyroid nodules 12/28/2016  . Simple chronic bronchitis ([place]) 12/13/2016  . BMI 40.0-44.9, adult ([place]) 09/26/2016  . HLD (hyperlipidemia) 09/26/2016  . Vitamin D deficiency 09/26/2016  . Pre-diabetes 06/17/2015  . Candidal intertrigo 02/21/2015  . Benign cyst of right kidney 01/17/2015  . Whiplash injury 09/03/2014  . Heart palpitations 04/02/2014  . Allergic rhinitis 12/31/2013  . Spondylosis of lumbar region without myelopathy or radiculopathy 12/28/2013  . Chronic low back pain 09/28/2013  . Other fatigue 09/28/2013  . Hypothyroidism 09/28/2013  . Alteration of body temperature 09/28/2013  . Plantar fasciitis, bilateral 07/10/2013  . Tarsal tunnel syndrome of right side 07/10/2013  . Plantar fascial fibromatosis 07/10/2013  . Encounter for surveillance of injectable contraceptive 05/13/2013  . Encounter for Depo-Provera contraception 05/13/2013  . Diuresis excessive 03/24/2013  . Abnormal uterine and vaginal bleeding, unspecified 03/24/2013  . Polyuria 03/24/2013  . Arthritis, multiple joint involvement 03/17/2013  . Fibroblastic disorder 07/28/2012  . Borderline personality disorder ([place]) 12/17/2010  . Essential hypertension 02/18/2010  . Carpal tunnel syndrome on both sides 12/17/2006  . Gall bladder stones 12/17/1998  . Major depressive disorder, single episode 02/21/1998  . Disk prolapse 04/17/1995  . Depression with anxiety 03/18/1995  . OCD (obsessive compulsive disorder) 03/18/1995  . Severe anxiety with panic 03/18/1995    -Reassurance is given.  I will plan on seeing her again in about a  month to ensure that she finishes healing her surgical sites, and progresses and various other issues she presented.   Ronny Bacon M.D., FACS 07/28/2020, 12:12 PM

## 2020-07-28 NOTE — Patient Instructions (Addendum)
Dr.Rodenberg recommends patient to try over the counter Fiber Supplements and increase Fiber intake. He suggested patient to try and elevate her feet to help with swelling along with compression socks. Patient recommended to keep Neosporin on the incision site to help with healing.  GENERAL POST-OPERATIVE PATIENT INSTRUCTIONS   WOUND CARE INSTRUCTIONS:  Keep a dry clean dressing on the wound if there is drainage. The initial bandage may be removed after 24 hours.  Once the wound has quit draining you may leave it open to air.  If clothing rubs against the wound or causes irritation and the wound is not draining you may cover it with a dry dressing during the daytime.  Try to keep the wound dry and avoid ointments on the wound unless directed to do so.  If the wound becomes bright red and painful or starts to drain infected material that is not clear, please contact your physician immediately.  If the wound is mildly pink and has a thick firm ridge underneath it, this is normal, and is referred to as a healing ridge.  This will resolve over the next 4-6 weeks.  BATHING: You may shower if you have been informed of this by your surgeon. However, Please do not submerge in a tub, hot tub, or pool until incisions are completely sealed or have been told by your surgeon that you may do so.  DIET:  You may eat any foods that you can tolerate.  It is a good idea to eat a high fiber diet and take in plenty of fluids to prevent constipation.  If you do become constipated you may want to take a mild laxative or take ducolax tablets on a daily basis until your bowel habits are regular.  Constipation can be very uncomfortable, along with straining, after recent surgery.  ACTIVITY:  You are encouraged to cough and deep breath or use your incentive spirometer if you were given one, every 15-30 minutes when awake.  This will help prevent respiratory complications and low grade fevers post-operatively if you had a  general anesthetic.  You may want to hug a pillow when coughing and sneezing to add additional support to the surgical area, if you had abdominal or chest surgery, which will decrease pain during these times.  You are encouraged to walk and engage in light activity for the next two weeks.  You should not lift more than 20 pounds, until 4 to 6 weeks after surgery as it could put you at increased risk for complications.  Twenty pounds is roughly equivalent to a plastic bag of groceries. At that time- Listen to your body when lifting, if you have pain when lifting, stop and then try again in a few days. Soreness after doing exercises or activities of daily living is normal as you get back in to your normal routine.  MEDICATIONS:  Try to take narcotic medications and anti-inflammatory medications, such as tylenol, ibuprofen, naprosyn, etc., with food.  This will minimize stomach upset from the medication.  Should you develop nausea and vomiting from the pain medication, or develop a rash, please discontinue the medication and contact your physician.  You should not drive, make important decisions, or operate machinery when taking narcotic pain medication.  SUNBLOCK Use sun block to incision area over the next year if this area will be exposed to sun. This helps decrease scarring and will allow you avoid a permanent darkened area over your incision.  QUESTIONS:  Please feel free to call our  office if you have any questions, and we will be glad to assist you.  [phone].

## 2020-07-31 ENCOUNTER — Encounter: Payer: Self-pay | Admitting: Surgery

## 2020-08-03 ENCOUNTER — Inpatient Hospital Stay: Payer: Medicare Other

## 2020-08-04 ENCOUNTER — Inpatient Hospital Stay: Payer: Medicare Other | Attending: Hematology and Oncology

## 2020-08-04 ENCOUNTER — Encounter (INDEPENDENT_AMBULATORY_CARE_PROVIDER_SITE_OTHER): Payer: Self-pay | Admitting: Vascular Surgery

## 2020-08-04 ENCOUNTER — Ambulatory Visit (INDEPENDENT_AMBULATORY_CARE_PROVIDER_SITE_OTHER): Payer: Medicare Other | Admitting: Vascular Surgery

## 2020-08-04 ENCOUNTER — Ambulatory Visit (INDEPENDENT_AMBULATORY_CARE_PROVIDER_SITE_OTHER): Payer: Medicare Other

## 2020-08-04 ENCOUNTER — Other Ambulatory Visit: Payer: Self-pay

## 2020-08-04 VITALS — BP 149/79 | HR 71 | Resp 16 | Wt 277.0 lb

## 2020-08-04 DIAGNOSIS — I10 Essential (primary) hypertension: Secondary | ICD-10-CM | POA: Diagnosis not present

## 2020-08-04 DIAGNOSIS — J410 Simple chronic bronchitis: Secondary | ICD-10-CM | POA: Diagnosis not present

## 2020-08-04 DIAGNOSIS — I890 Lymphedema, not elsewhere classified: Secondary | ICD-10-CM

## 2020-08-04 DIAGNOSIS — E782 Mixed hyperlipidemia: Secondary | ICD-10-CM

## 2020-08-04 DIAGNOSIS — E538 Deficiency of other specified B group vitamins: Secondary | ICD-10-CM | POA: Insufficient documentation

## 2020-08-04 DIAGNOSIS — I872 Venous insufficiency (chronic) (peripheral): Secondary | ICD-10-CM

## 2020-08-04 DIAGNOSIS — D509 Iron deficiency anemia, unspecified: Secondary | ICD-10-CM

## 2020-08-04 MED ORDER — CYANOCOBALAMIN 1000 MCG/ML IJ SOLN
INTRAMUSCULAR | Status: AC
  Filled 2020-08-04: qty 1

## 2020-08-04 MED ORDER — CYANOCOBALAMIN 1000 MCG/ML IJ SOLN
1000.0000 ug | Freq: Once | INTRAMUSCULAR | Status: AC
  Administered 2020-08-04: 1000 ug via INTRAMUSCULAR

## 2020-08-04 NOTE — Progress Notes (Signed)
MRN : [id number]  Amanda Webb is a 49 y.o. (01-13-1971) female who presents with chief complaint of  Chief Complaint  Patient presents with  . Follow-up    ultrasound follow up  .  History of Present Illness:   The patient returns to the office for followup evaluation regarding leg swelling.  The swelling has persisted and the pain associated with swelling continues. There have not been any interval development of a ulcerations or wounds.  Since the previous visit the patient has been wearing graduated compression stockings and has noted little if any improvement in the lymphedema. The patient has been using compression routinely morning until night.  The patient also states elevation during the day and exercise is being done too.   Current Meds  Medication Sig  . Accu-Chek FastClix Lancets MISC USE TO CHECK SUGAR UP TO TWICE DAILY AS DIRECTED  . ACCU-CHEK GUIDE test strip CHECK BLOOD SUGAR UP TO TWICE DAILY AS DIRECTED  . acetaminophen (TYLENOL) 500 MG tablet Take 1,000 mg by mouth every 6 (six) hours as needed for moderate pain or headache.  . albuterol (VENTOLIN HFA) 108 (90 Base) MCG/ACT inhaler INHALE 1 TO 2 PUFFS BY MOUTH INTO THE LUNGS EVERY 4 TO 6 HOURS AS NEEDED FOR SHORTNESS OF BREATH (Patient taking differently: Inhale 1-2 puffs into the lungs every 4 (four) hours as needed for wheezing or shortness of breath (USES THIS FOR WHEEZING DUE TO SMOKING. NOT FOR ASTHMA OR COPD PER PT). )  . ARIPiprazole (ABILIFY) 2 MG tablet Take 2 mg by mouth every morning.   Marland Kitchen atenolol (TENORMIN) 50 MG tablet Take 0.5 tablets (25 mg total) by mouth daily. (Patient taking differently: Take 25 mg by mouth every morning. )  . Cetirizine HCl (ZYRTEC ALLERGY) 10 MG CAPS Take 10 mg by mouth daily with lunch.   . Cyanocobalamin (B-12 COMPLIANCE INJECTION IJ) Inject 1 Dose as directed every 30 (thirty) days.  . fluticasone (FLONASE) 50 MCG/ACT nasal spray SHAKE LIQUID AND USE 2 SPRAYS IN EACH  NOSTRIL EVERY DAY (Patient taking differently: Place 2 sprays into both nostrils daily. )  . fluvoxaMINE (LUVOX) 100 MG tablet Take 150 mg by mouth every morning.   . gabapentin (NEURONTIN) 100 MG capsule Take 200 mg by mouth 3 (three) times daily.   Marland Kitchen ibuprofen (ADVIL) 800 MG tablet Take 1 tablet (800 mg total) by mouth every 8 (eight) hours as needed.  Marland Kitchen ketoconazole (NIZORAL) 2 % shampoo APPLY EXTERNALLY 2 TIMES A WEEK  . lamoTRIgine (LAMICTAL) 200 MG tablet Take 200 mg by mouth every morning.   Marland Kitchen levothyroxine (SYNTHROID) 50 MCG tablet TAKE 1 TABLET BY MOUTH EVERY MORNING (Patient taking differently: Take 50 mcg by mouth daily before breakfast. )  . medroxyPROGESTERone (DEPO-PROVERA) 150 MG/ML injection ADMINISTER 1 ML(150 MG) IN THE MUSCLE EVERY 3 MONTHS (Patient taking differently: Inject 150 mg into the muscle every 3 (three) months. )  . Multiple Vitamin (MULTIVITAMIN WITH MINERALS) TABS tablet Take 1 tablet by mouth daily.  . pantoprazole (PROTONIX) 20 MG tablet TAKE 1 TABLET(20 MG) BY MOUTH DAILY  . QUEtiapine (SEROQUEL) 25 MG tablet Take 25 mg by mouth every morning.   . tretinoin (RETIN-A) 0.025 % cream APPLY EXTERNALLY TO THE AFFECTED AREA AT BEDTIME AS NEEDED (Patient taking differently: Apply 1 application topically at bedtime as needed (acne). )    Past Medical History:  Diagnosis Date  . Allergy   . Anemia   . Anxiety   .  Arrhythmia   . Arthritis   . Cervical dysplasia   . Cystitis   . Depression   . GERD (gastroesophageal reflux disease)   . Gross hematuria   . Headache   . Heart murmur    asymptomatic  . History of methicillin resistant staphylococcus aureus (MRSA) 2013  . HLD (hyperlipidemia)   . Hypertension   . Hypothyroid   . Lymphedema   . Palpitations   . Pernicious anemia   . Tobacco abuse     Past Surgical History:  Procedure Laterality Date  . CARPAL TUNNEL RELEASE Bilateral 2011  . FOOT SURGERY Right 2013   Plantar fascia  . ROBOTIC ASSISTED  LAPAROSCOPIC CHOLECYSTECTOMY  07/15/2020   Dr Christian Mate  . TONSILLECTOMY  1979   with ear tubes    Social History Social History   Tobacco Use  . Smoking status: Current Every Day Smoker    Packs/day: 0.50    Years: 30.00    Pack years: 15.00    Types: Cigarettes  . Smokeless tobacco: Never Used  Vaping Use  . Vaping Use: Never used  Substance Use Topics  . Alcohol use: Yes    Comment: wine occ  . Drug use: No    Family History Family History  Problem Relation Age of Onset  . Depression Mother   . Mental illness Mother   . Alcohol abuse Mother   . Lung cancer Mother   . Cancer Maternal Grandmother   . Diabetes Maternal Grandmother   . Ovarian cancer Maternal Grandmother   . Kidney disease Maternal Grandfather   . Diabetes Father   . Mental illness Father   . Depression Father   . Drug abuse Father   . Depression Paternal Grandfather   . Drug abuse Paternal Grandfather   . Bladder Cancer Neg Hx   . Breast cancer Neg Hx     Allergies  Allergen Reactions  . Bee Venom Anaphylaxis, Hives and Swelling    Carries Epi pen.   . Cat Hair Extract Itching, Other (See Comments) and Swelling    Allergic to trees, nuts, wheat, grass, cats & dogs - itchy watery eyes, swelling. Uses Zyrtec & Flonase & Benadryl if really bad. Used to get allergy shots. Allergic to trees, nuts, wheat, grass, cats & dogs - itchy watery eyes, swelling. Uses Zyrtec & Flonase & Benadryl if really bad. Used to get allergy shots.  . Tetracyclines & Related Nausea And Vomiting  . Lac Bovis Diarrhea and Nausea And Vomiting  . Lactose Diarrhea and Nausea And Vomiting  . Milk Protein Diarrhea and Nausea And Vomiting  . Tape Other (See Comments) and Rash    Needs to use paper tape. Breaks out with severe rash, pulls skin off when using adhesive.     REVIEW OF SYSTEMS (Negative unless checked)  Constitutional: [] Weight loss  [] Fever  [] Chills Cardiac: [] Chest pain   [] Chest pressure   [] Palpitations    [] Shortness of breath when laying flat   [] Shortness of breath with exertion. Vascular:  [] Pain in legs with walking   [x] Pain in legs at rest  [] History of DVT   [] Phlebitis   [x] Swelling in legs   [] Varicose veins   [] Non-healing ulcers Pulmonary:   [] Uses home oxygen   [] Productive cough   [] Hemoptysis   [] Wheeze  [] COPD   [] Asthma Neurologic:  [] Dizziness   [] Seizures   [] History of stroke   [] History of TIA  [] Aphasia   [] Vissual changes   [] Weakness or numbness in  arm   [] Weakness or numbness in leg Musculoskeletal:   [] Joint swelling   [] Joint pain   [] Low back pain Hematologic:  [] Easy bruising  [] Easy bleeding   [] Hypercoagulable state   [] Anemic Gastrointestinal:  [] Diarrhea   [] Vomiting  [] Gastroesophageal reflux/heartburn   [] Difficulty swallowing. Genitourinary:  [] Chronic kidney disease   [] Difficult urination  [] Frequent urination   [] Blood in urine Skin:  [] Rashes   [] Ulcers  Psychological:  [] History of anxiety   []  History of major depression.  Physical Examination  Vitals:   08/04/20 0920  BP: (!) 149/79  Pulse: 71  Resp: 16  Weight: 277 lb (125.6 kg)   Body mass index is 43.38 kg/m. Gen: WD/WN, NAD Head: [place]/AT, No temporalis wasting.  Ear/Nose/Throat: Hearing grossly intact, nares w/o erythema or drainage Eyes: PER, EOMI, sclera nonicteric.  Neck: Supple, no large masses.   Pulmonary:  Good air movement, no audible wheezing bilaterally, no use of accessory muscles.  Cardiac: RRR, no JVD Vascular: scattered varicosities present bilaterally.  Moderate venous stasis changes to the legs bilaterally.  3-4+ soft pitting edema Vessel Right Left  Radial Palpable Palpable  Gastrointestinal: Non-distended. No guarding/no peritoneal signs.  Musculoskeletal: M/S 5/5 throughout.  No deformity or atrophy.  Neurologic: CN 2-12 intact. Symmetrical.  Speech is fluent. Motor exam as listed above. Psychiatric: Judgment intact, Mood & affect appropriate for pt's clinical  situation. Dermatologic: No rashes or ulcers noted.  No changes consistent with cellulitis.   CBC Lab Results  Component Value Date   WBC 10.5 07/19/2020   HGB 14.4 07/19/2020   HCT 42.4 07/19/2020   MCV 94.0 07/19/2020   PLT 285 07/19/2020    BMET    Component Value Date/Time   NA 142 07/19/2020 2126   K 3.4 (L) 07/19/2020 2126   CL 102 07/19/2020 2126   CO2 28 07/19/2020 2126   GLUCOSE 115 (H) 07/19/2020 2126   BUN 6 07/19/2020 2126   CREATININE 0.68 07/19/2020 2126   CREATININE 0.65 01/01/2019 1025   CALCIUM 9.2 07/19/2020 2126   GFRNONAA >60 07/19/2020 2126   GFRNONAA 106 01/01/2019 1025   GFRAA >60 07/19/2020 2126   GFRAA 123 01/01/2019 1025   Estimated Creatinine Clearance: 118.4 mL/min (by C-G formula based on SCr of 0.68 mg/dL).  COAG No results found for: INR, PROTIME  Radiology US Abdomen Limited RUQ  Result Date: 07/21/2020 CLINICAL DATA:  Right upper quadrant pain.  Chronic abdominal pain. EXAM: ULTRASOUND ABDOMEN LIMITED RIGHT UPPER QUADRANT COMPARISON:  Ultrasound 07/12/2020. FINDINGS: Gallbladder: Prior cholecystectomy. Small fluid collection in the gallbladder fossa measuring 3.9 x 2.2 x 1.5 cm noted. This may be from recent cholecystectomy. The possibility of a bile leak cannot be excluded. Small seroma/hematoma/abscess cannot be excluded. Common bile duct: Diameter: 5 mm Liver: Increased hepatic echogenicity consistent fatty infiltration or hepatocellular disease. Portal vein is patent on color Doppler imaging with normal direction of blood flow towards the liver. Other: None. IMPRESSION: 1. Prior cholecystectomy. Small 3.9 cm fluid collection noted the gallbladder fossa. This may be from recent cholecystectomy. The possibly of a bile leak cannot be excluded. Small seroma/hematoma/abscess cannot be excluded. No biliary distention. 2. Increased hepatic echogenicity consistent fatty infiltration or hepatocellular disease. Electronically Signed   By: Marcello Moores   Register   On: 07/21/2020 09:38      Assessment/Plan 1. Lymphedema Recommend:  No surgery or intervention at this point in time.    I have reviewed my previous discussion with the patient regarding swelling and why it  causes symptoms.  Patient will continue wearing graduated compression stockings class 1 (20-30 mmHg) on a daily basis. The patient will  beginning wearing the stockings first thing in the morning and removing them in the evening. The patient is instructed specifically not to sleep in the stockings.    In addition, behavioral modification including several periods of elevation of the lower extremities during the day will be continued.  This was reviewed with the patient during the initial visit.  The patient will also continue routine exercise, especially walking on a daily basis as was discussed during the initial visit.    Despite conservative treatments including graduated compression therapy class 1 and behavioral modification including exercise and elevation the patient  has not obtained adequate control of the lymphedema.  The patient still has stage 3 lymphedema and therefore, I believe that a lymph pump should be added to improve the control of the patient's lymphedema.  Additionally, a lymph pump is warranted because it will reduce the risk of cellulitis and ulceration in the future.  Patient should follow-up in six months    2. Chronic venous insufficiency Recommend:  No surgery or intervention at this point in time.    I have reviewed my previous discussion with the patient regarding swelling and why it causes symptoms.  Patient will continue wearing graduated compression stockings class 1 (20-30 mmHg) on a daily basis. The patient will  beginning wearing the stockings first thing in the morning and removing them in the evening. The patient is instructed specifically not to sleep in the stockings.    In addition, behavioral modification including several periods  of elevation of the lower extremities during the day will be continued.  This was reviewed with the patient during the initial visit.  The patient will also continue routine exercise, especially walking on a daily basis as was discussed during the initial visit.    Despite conservative treatments including graduated compression therapy class 1 and behavioral modification including exercise and elevation the patient  has not obtained adequate control of the lymphedema.  The patient still has stage 3 lymphedema and therefore, I believe that a lymph pump should be added to improve the control of the patient's lymphedema.  Additionally, a lymph pump is warranted because it will reduce the risk of cellulitis and ulceration in the future.  Patient should follow-up in six months    3. Essential hypertension Continue antihypertensive medications as already ordered, these medications have been reviewed and there are no changes at this time.   4. Mixed hyperlipidemia Continue statin as ordered and reviewed, no changes at this time   5. Simple chronic bronchitis (HCC) Continue pulmonary medications and aerosols as already ordered, these medications have been reviewed and there are no changes at this time.     Hortencia Pilar, MD  08/04/2020 9:23 AM

## 2020-08-05 ENCOUNTER — Ambulatory Visit (INDEPENDENT_AMBULATORY_CARE_PROVIDER_SITE_OTHER): Payer: Medicare Other

## 2020-08-05 ENCOUNTER — Encounter (INDEPENDENT_AMBULATORY_CARE_PROVIDER_SITE_OTHER): Payer: Self-pay | Admitting: Vascular Surgery

## 2020-08-05 DIAGNOSIS — Z3042 Encounter for surveillance of injectable contraceptive: Secondary | ICD-10-CM | POA: Diagnosis not present

## 2020-08-05 MED ORDER — MEDROXYPROGESTERONE ACETATE 150 MG/ML IM SUSP
150.0000 mg | Freq: Once | INTRAMUSCULAR | Status: AC
  Administered 2020-08-05: 150 mg via INTRAMUSCULAR

## 2020-08-05 NOTE — Progress Notes (Signed)
Date last pap: 04/02/2017. Last Depo-Provera: 05/20/2020. Side Effects if any: none Serum HCG indicated? n/a Depo-Provera 150 mg IM given by: F.Shilo Pauwels, LPN. Next appointment due Nov 5-19, 2021.

## 2020-08-17 ENCOUNTER — Other Ambulatory Visit: Payer: Self-pay | Admitting: Family Medicine

## 2020-08-17 DIAGNOSIS — R7303 Prediabetes: Secondary | ICD-10-CM

## 2020-08-18 DIAGNOSIS — L300 Nummular dermatitis: Secondary | ICD-10-CM | POA: Diagnosis not present

## 2020-08-18 DIAGNOSIS — L738 Other specified follicular disorders: Secondary | ICD-10-CM | POA: Diagnosis not present

## 2020-08-18 DIAGNOSIS — L219 Seborrheic dermatitis, unspecified: Secondary | ICD-10-CM | POA: Diagnosis not present

## 2020-08-18 DIAGNOSIS — D229 Melanocytic nevi, unspecified: Secondary | ICD-10-CM | POA: Diagnosis not present

## 2020-08-18 DIAGNOSIS — B079 Viral wart, unspecified: Secondary | ICD-10-CM | POA: Diagnosis not present

## 2020-08-22 ENCOUNTER — Encounter: Payer: Self-pay | Admitting: Surgery

## 2020-08-24 ENCOUNTER — Other Ambulatory Visit: Payer: Self-pay

## 2020-08-24 ENCOUNTER — Telehealth (INDEPENDENT_AMBULATORY_CARE_PROVIDER_SITE_OTHER): Payer: Medicare Other | Admitting: Family Medicine

## 2020-08-24 ENCOUNTER — Encounter: Payer: Self-pay | Admitting: Family Medicine

## 2020-08-24 DIAGNOSIS — J019 Acute sinusitis, unspecified: Secondary | ICD-10-CM | POA: Insufficient documentation

## 2020-08-24 MED ORDER — CEFDINIR 300 MG PO CAPS: 300.0000 mg | ORAL_CAPSULE | Freq: Two times a day (BID) | ORAL | 0 refills | Status: AC

## 2020-08-24 NOTE — Patient Instructions (Addendum)
As we discussed, we are treating you for a sinus infection.  Since you recently completed Augmentin (05/2020), I have sent in a prescription for cefdinir 300mg  to take 2x per day for the next 10 days.  Begin mucinex, according to packaging directions, to help thin mucus  HOLD the flonase at this time, can begin a saline nasal spray  We will plan to see you back if your symptoms worsen or fail to improve  You will receive a survey after today's visit either digitally by e-mail or paper by [employer]. Your experiences and feedback matter to [place].  Please respond so we know how we are doing as we provide care for you.  Call us with any questions/concerns/needs.  It is my goal to be available to you for your health concerns.  Thanks for choosing me to be a partner in your healthcare needs!  Harlin Rain, FNP-C Family Nurse Practitioner [facility] Phone: [phone]   Sinusitis, Adult Sinusitis is soreness and swelling (inflammation) of your sinuses. Sinuses are hollow spaces in the bones around your face. They are located:  Around your eyes.  In the middle of your forehead.  Behind your nose.  In your cheekbones. Your sinuses and nasal passages are lined with a fluid called mucus. Mucus drains out of your sinuses. Swelling can trap mucus in your sinuses. This lets germs (bacteria, virus, or fungus) grow, which leads to infection. Most of the time, this condition is caused by a virus. What are the causes? This condition is caused by:  Allergies.  Asthma.  Germs.  Things that block your nose or sinuses.  Growths in the nose (nasal polyps).  Chemicals or irritants in the air.  Fungus (rare). What increases the risk? You are more likely to develop this condition if:  You have a weak body defense system (immune system).  You do a lot of swimming or diving.  You use nasal sprays too much.  You smoke. What are the signs or  symptoms? The main symptoms of this condition are pain and a feeling of pressure around the sinuses. Other symptoms include:  Stuffy nose (congestion).  Runny nose (drainage).  Swelling and warmth in the sinuses.  Headache.  Toothache.  A cough that may get worse at night.  Mucus that collects in the throat or the back of the nose (postnasal drip).  Being unable to smell and taste.  Being very tired (fatigue).  A fever.  Sore throat.  Bad breath. How is this diagnosed? This condition is diagnosed based on:  Your symptoms.  Your medical history.  A physical exam.  Tests to find out if your condition is short-term (acute) or long-term (chronic). Your doctor may: ? Check your nose for growths (polyps). ? Check your sinuses using a tool that has a light (endoscope). ? Check for allergies or germs. ? Do imaging tests, such as an MRI or CT scan. How is this treated? Treatment for this condition depends on the cause and whether it is short-term or long-term.  If caused by a virus, your symptoms should go away on their own within 10 days. You may be given medicines to relieve symptoms. They include: ? Medicines that shrink swollen tissue in the nose. ? Medicines that treat allergies (antihistamines). ? A spray that treats swelling of the nostrils. ? Rinses that help get rid of thick mucus in your nose (nasal saline washes).  If caused by bacteria, your  doctor may wait to see if you will get better without treatment. You may be given antibiotic medicine if you have: ? A very bad infection. ? A weak body defense system.  If caused by growths in the nose, you may need to have surgery. Follow these instructions at home: Medicines  Take, use, or apply over-the-counter and prescription medicines only as told by your doctor. These may include nasal sprays.  If you were prescribed an antibiotic medicine, take it as told by your doctor. Do not stop taking the antibiotic  even if you start to feel better. Hydrate and humidify   Drink enough water to keep your pee (urine) pale yellow.  Use a cool mist humidifier to keep the humidity level in your home above 50%.  Breathe in steam for 10-15 minutes, 3-4 times a day, or as told by your doctor. You can do this in the bathroom while a hot shower is running.  Try not to spend time in cool or dry air. Rest  Rest as much as you can.  Sleep with your head raised (elevated).  Make sure you get enough sleep each night. General instructions   Put a warm, moist washcloth on your face 3-4 times a day, or as often as told by your doctor. This will help with discomfort.  Wash your hands often with soap and water. If there is no soap and water, use hand sanitizer.  Do not smoke. Avoid being around people who are smoking (secondhand smoke).  Keep all follow-up visits as told by your doctor. This is important. Contact a doctor if:  You have a fever.  Your symptoms get worse.  Your symptoms do not get better within 10 days. Get help right away if:  You have a very bad headache.  You cannot stop throwing up (vomiting).  You have very bad pain or swelling around your face or eyes.  You have trouble seeing.  You feel confused.  Your neck is stiff.  You have trouble breathing. Summary  Sinusitis is swelling of your sinuses. Sinuses are hollow spaces in the bones around your face.  This condition is caused by tissues in your nose that become inflamed or swollen. This traps germs. These can lead to infection.  If you were prescribed an antibiotic medicine, take it as told by your doctor. Do not stop taking it even if you start to feel better.  Keep all follow-up visits as told by your doctor. This is important. This information is not intended to replace advice given to you by your health care provider. Make sure you discuss any questions you have with your health care provider. Document Revised:  05/05/2018 Document Reviewed: 05/05/2018 Elsevier Patient Education  [place].

## 2020-08-24 NOTE — Assessment & Plan Note (Signed)
Acute sinusitis, had sinusitis 06/13/2020 and was tx with Augmentin will full symptom resolution.  Has had a few days of sinus pain/pressure with purulent nasal drainage along with blood tinged nasal drainage.  Encouraged to stop the flonase, as is likely causing the nasal irritation, will send in Cefdinir 300mg  BID x 10 days and encouraged to begin mucinex according to packaging directions.  Plan: 1. Begin cefdinir 300mg  BID x 10 days 2. Begin mucinex, according to packaging directions to help thin mucus 3. Increase water intake 4. Hold flonase nasal spray and can begin saline nasal spray to help with nasal irritation 5. RTC if symptoms worsen or fail to improve with current treatment plan

## 2020-08-24 NOTE — Progress Notes (Signed)
Virtual Visit via MyChart Video Visit  The purpose of this virtual visit is to provide medical care while limiting exposure to the novel coronavirus (COVID19) for both patient and office staff.  Consent was obtained for phone visit:  Yes.   Answered questions that patient had about telehealth interaction:  Yes.   I discussed the limitations, risks, security and privacy concerns of performing an evaluation and management service by telephone. I also discussed with the patient that there may be a patient responsible charge related to this service. The patient expressed understanding and agreed to proceed.  Patient is at home and is accessed via [place] are provided by Harlin Rain, FNP-C from [facility])  ---------------------------------------------------------------------- Chief Complaint  Patient presents with  . Sinus Problem    S: Reviewed CMA documentation. I have called patient and gathered additional HPI as follows:  Ms. Fessenden presents for MyChart Video Visit with concerns of a sinus problem.  Reports that she has had a few days of sinus pressure/pain with purulent nasal drainage and blood tinged nasal drainage.  Had a sinus infection in June 2021 that she had taken Augmentin with full symptom resolution.  Highest temperature has been 99.68F.  Denies sore throat, change in taste/smell, SOB, cough, CP, abdominal pain, n/v/d  Patient is currently home Denies any high risk travel to areas of current concern for COVID19. Denies any known or suspected exposure to person with or possibly with COVID19.  Past Medical History:  Diagnosis Date  . Allergy   . Anemia   . Anxiety   . Arrhythmia   . Arthritis   . Cervical dysplasia   . Cystitis   . Depression   . GERD (gastroesophageal reflux disease)   . Gross hematuria   . Headache   . Heart murmur    asymptomatic  . History of methicillin resistant staphylococcus aureus (MRSA)  2013  . HLD (hyperlipidemia)   . Hypertension   . Hypothyroid   . Lymphedema   . Palpitations   . Pernicious anemia   . Tobacco abuse    Social History   Tobacco Use  . Smoking status: Current Every Day Smoker    Packs/day: 0.50    Years: 30.00    Pack years: 15.00    Types: Cigarettes  . Smokeless tobacco: Never Used  Vaping Use  . Vaping Use: Never used  Substance Use Topics  . Alcohol use: Yes    Comment: wine occ  . Drug use: No    Current Outpatient Medications:  .  Accu-Chek FastClix Lancets MISC, USE TO CHECK SUGAR UP TO TWICE DAILY AS DIRECTED, Disp: 102 each, Rfl: 5 .  ACCU-CHEK GUIDE test strip, CHECK BLOOD SUGAR UP TO TWICE DAILY AS DIRECTED, Disp: 100 strip, Rfl: 5 .  acetaminophen (TYLENOL) 500 MG tablet, Take 1,000 mg by mouth every 6 (six) hours as needed for moderate pain or headache., Disp: , Rfl:  .  albuterol (VENTOLIN HFA) 108 (90 Base) MCG/ACT inhaler, INHALE 1 TO 2 PUFFS BY MOUTH INTO THE LUNGS EVERY 4 TO 6 HOURS AS NEEDED FOR SHORTNESS OF BREATH (Patient taking differently: Inhale 1-2 puffs into the lungs every 4 (four) hours as needed for wheezing or shortness of breath (USES THIS FOR WHEEZING DUE TO SMOKING. NOT FOR ASTHMA OR COPD PER PT). ), Disp: 18 g, Rfl: 2 .  ARIPiprazole (ABILIFY) 2 MG tablet, Take 2 mg by mouth every morning. , Disp: , Rfl:  .  atenolol (TENORMIN) 50 MG tablet, Take 0.5 tablets (25 mg total) by mouth daily. (Patient taking differently: Take 25 mg by mouth every morning. ), Disp: 45 tablet, Rfl: 3 .  cefdinir (OMNICEF) 300 MG capsule, Take 1 capsule (300 mg total) by mouth 2 (two) times daily for 10 days., Disp: 20 capsule, Rfl: 0 .  Cetirizine HCl (ZYRTEC ALLERGY) 10 MG CAPS, Take 10 mg by mouth daily with lunch. , Disp: , Rfl:  .  Cyanocobalamin (B-12 COMPLIANCE INJECTION IJ), Inject 1 Dose as directed every 30 (thirty) days., Disp: , Rfl:  .  fluticasone (FLONASE) 50 MCG/ACT nasal spray, SHAKE LIQUID AND USE 2 SPRAYS IN EACH  NOSTRIL EVERY DAY (Patient taking differently: Place 2 sprays into both nostrils daily. ), Disp: 48 g, Rfl: 1 .  fluvoxaMINE (LUVOX) 100 MG tablet, Take 150 mg by mouth every morning. , Disp: , Rfl:  .  gabapentin (NEURONTIN) 100 MG capsule, Take 200 mg by mouth 3 (three) times daily. , Disp: , Rfl:  .  ibuprofen (ADVIL) 800 MG tablet, Take 1 tablet (800 mg total) by mouth every 8 (eight) hours as needed., Disp: 30 tablet, Rfl: 0 .  ketoconazole (NIZORAL) 2 % shampoo, APPLY EXTERNALLY 2 TIMES A WEEK, Disp: 120 mL, Rfl: 2 .  lamoTRIgine (LAMICTAL) 200 MG tablet, Take 200 mg by mouth every morning. , Disp: , Rfl:  .  levothyroxine (SYNTHROID) 50 MCG tablet, TAKE 1 TABLET BY MOUTH EVERY MORNING (Patient taking differently: Take 50 mcg by mouth daily before breakfast. ), Disp: 90 tablet, Rfl: 3 .  medroxyPROGESTERone (DEPO-PROVERA) 150 MG/ML injection, ADMINISTER 1 ML(150 MG) IN THE MUSCLE EVERY 3 MONTHS (Patient taking differently: Inject 150 mg into the muscle every 3 (three) months. ), Disp: 1 mL, Rfl: 1 .  Multiple Vitamin (MULTIVITAMIN WITH MINERALS) TABS tablet, Take 1 tablet by mouth daily., Disp: , Rfl:  .  pantoprazole (PROTONIX) 20 MG tablet, TAKE 1 TABLET(20 MG) BY MOUTH DAILY, Disp: 90 tablet, Rfl: 3 .  QUEtiapine (SEROQUEL) 25 MG tablet, Take 25 mg by mouth every morning. , Disp: , Rfl:  .  tretinoin (RETIN-A) 0.025 % cream, APPLY EXTERNALLY TO THE AFFECTED AREA AT BEDTIME AS NEEDED (Patient taking differently: Apply 1 application topically at bedtime as needed (acne). ), Disp: 45 g, Rfl: 2  Depression screen [facility] 2/9 12/30/2019 07/07/2019 01/01/2019  Decreased Interest 0 1 0  Down, Depressed, Hopeless 0 1 0  PHQ - 2 Score 0 2 0  Altered sleeping - 3 3  Tired, decreased energy - 3 0  Change in appetite - 0 0  Feeling bad or failure about yourself  - 3 0  Trouble concentrating - 0 0  Moving slowly or fidgety/restless - 0 0  Suicidal thoughts - 0 0  PHQ-9 Score - 11 3  Difficult doing  work/chores - Somewhat difficult Not difficult at all  Some recent data might be hidden    GAD 7 : Generalized Anxiety Score 01/01/2019  Nervous, Anxious, on Edge 3  Control/stop worrying 3  Worry too much - different things 3  Trouble relaxing 3  Restless 3  Easily annoyed or irritable 3  Afraid - awful might happen 3  Total GAD 7 Score 21  Anxiety Difficulty Somewhat difficult    -------------------------------------------------------------------------- O: No physical exam performed due to remote telephone encounter.  Physical Exam: Patient remotely monitored with video.  Verbal communication appropriate.  Cognition normal.  Recent Results (from the past 2160 hour(s))  Ferritin  Status: None   Collection Time: 06/02/20  1:27 PM  Result Value Ref Range   Ferritin 77 11 - 307 ng/mL    Comment: Performed at [facility], [address] [place], [place] [address]  CBC with Differential/Platelet     Status: None   Collection Time: 06/02/20  1:27 PM  Result Value Ref Range   WBC 10.3 4.0 - 10.5 K/uL   RBC 4.43 3.87 - 5.11 MIL/uL   Hemoglobin 14.2 12.0 - 15.0 g/dL   HCT 40.9 36 - 46 %   MCV 92.3 80.0 - 100.0 fL   MCH 32.1 26.0 - 34.0 pg   MCHC 34.7 30.0 - 36.0 g/dL   RDW 12.7 11.5 - 15.5 %   Platelets 231 150 - 400 K/uL   nRBC 0.0 0.0 - 0.2 %   Neutrophils Relative % 60 %   Neutro Abs 6.1 1.7 - 7.7 K/uL   Lymphocytes Relative 32 %   Lymphs Abs 3.3 0.7 - 4.0 K/uL   Monocytes Relative 6 %   Monocytes Absolute 0.6 0 - 1 K/uL   Eosinophils Relative 2 %   Eosinophils Absolute 0.2 0 - 0 K/uL   Basophils Relative 0 %   Basophils Absolute 0.0 0 - 0 K/uL   Immature Granulocytes 0 %   Abs Immature Granulocytes 0.03 0.00 - 0.07 K/uL    Comment: Performed at [facility], [address] [place], [place] [address]  CBC WITH DIFFERENTIAL     Status: None   Collection Time: 07/12/20  9:58 AM  Result Value Ref Range   WBC 10.0 4.0 - 10.5 K/uL   RBC 4.23 3.87  - 5.11 MIL/uL   Hemoglobin 13.6 12.0 - 15.0 g/dL   HCT 39.5 36 - 46 %   MCV 93.4 80.0 - 100.0 fL   MCH 32.2 26.0 - 34.0 pg   MCHC 34.4 30.0 - 36.0 g/dL   RDW 12.6 11.5 - 15.5 %   Platelets 234 150 - 400 K/uL   nRBC 0.0 0.0 - 0.2 %   Neutrophils Relative % 61 %   Neutro Abs 6.0 1.7 - 7.7 K/uL   Lymphocytes Relative 31 %   Lymphs Abs 3.1 0.7 - 4.0 K/uL   Monocytes Relative 6 %   Monocytes Absolute 0.6 0 - 1 K/uL   Eosinophils Relative 2 %   Eosinophils Absolute 0.2 0 - 0 K/uL   Basophils Relative 0 %   Basophils Absolute 0.0 0 - 0 K/uL   Immature Granulocytes 0 %   Abs Immature Granulocytes 0.04 0.00 - 0.07 K/uL    Comment: Performed at [facility], [address] [place], [place] [address]  Comprehensive metabolic panel     Status: Abnormal   Collection Time: 07/12/20  9:58 AM  Result Value Ref Range   Sodium 135 135 - 145 mmol/L   Potassium 3.8 3.5 - 5.1 mmol/L   Chloride 105 98 - 111 mmol/L   CO2 23 22 - 32 mmol/L   Glucose, Bld 147 (H) 70 - 99 mg/dL    Comment: Glucose reference range applies only to samples taken after fasting for at least 8 hours.   BUN 6 6 - 20 mg/dL   Creatinine, Ser 0.61 0.44 - 1.00 mg/dL   Calcium 8.7 (L) 8.9 - 10.3 mg/dL   Total Protein 7.2 6.5 - 8.1 g/dL   Albumin 4.3 3.5 - 5.0 g/dL   AST 21 15 - 41 U/L   ALT  23 0 - 44 U/L   Alkaline Phosphatase 68 38 - 126 U/L   Total Bilirubin 0.7 0.3 - 1.2 mg/dL   GFR calc non Af Amer >60 >60 mL/min   GFR calc Af Amer >60 >60 mL/min   Anion gap 7 5 - 15    Comment: Performed at [facility], [place], [place] [address]  SARS CORONAVIRUS 2 (TAT 6-24 HRS) Nasopharyngeal Nasopharyngeal Swab     Status: None   Collection Time: 07/14/20  8:45 AM   Specimen: Nasopharyngeal Swab  Result Value Ref Range   SARS Coronavirus 2 NEGATIVE NEGATIVE    Comment: (NOTE) SARS-CoV-2 target nucleic acids are NOT DETECTED.  The SARS-CoV-2 RNA is generally detectable in upper and  lower respiratory specimens during the acute phase of infection. Negative results do not preclude SARS-CoV-2 infection, do not rule out co-infections with other pathogens, and should not be used as the sole basis for treatment or other patient management decisions. Negative results must be combined with clinical observations, patient history, and epidemiological information. The expected result is Negative.  Fact Sheet for Patients: SugarRoll.be  Fact Sheet for Healthcare Providers: https://www.woods-mathews.com/  This test is not yet approved or cleared by the [place] FDA and  has been authorized for detection and/or diagnosis of SARS-CoV-2 by FDA under an Emergency Use Authorization (EUA). This EUA will remain  in effect (meaning this test can be used) for the duration of the COVID-19 declaration under Se ction 564(b)(1) of the Act, 21 U.S.C. section 360bbb-3(b)(1), unless the authorization is terminated or revoked sooner.  Performed at [facility], [place] [address] [place], [place] [address]   Pregnancy, urine POC     Status: None   Collection Time: 07/15/20  6:21 AM  Result Value Ref Range   Preg Test, Ur NEGATIVE NEGATIVE    Comment:        THE SENSITIVITY OF THIS METHODOLOGY IS >24 mIU/mL   Surgical pathology     Status: None   Collection Time: 07/15/20  9:15 AM  Result Value Ref Range   SURGICAL PATHOLOGY      SURGICAL PATHOLOGY CASE: [id number] PATIENT: [name] Surgical Pathology Report     Specimen Submitted: A. Gallbladder  Clinical History: Chronic calculus cholecystitis      DIAGNOSIS: A. GALLBLADDER; CHOLECYSTECTOMY: - CHRONIC CHOLECYSTITIS WITH CHOLELITHIASIS. - NEGATIVE FOR MALIGNANCY.  GROSS DESCRIPTION: A. Labeled: Gallbladder Received: Formalin Size of specimen: 9.5 x 3.2 x 1.7 cm Specimen integrity: The specimen is received highly disrupted with the proximal portion of the  gallbladder separated from the distal portion. External surface: The serosa is pink-yellow, smooth, and glistening with a roughened hepatic bed.  The defects are 3.2 x 1.5 cm and 3.1 x 0.7 cm. Wall thickness: 0.2 cm Mucosa: The mucosa is tan and velvety with scattered areas of flattening and hemorrhage.  Cholesterolosis is absent. Cystic duct: The cystic duct is 1.7 x 1.2 x 0.7 cm and is patent.  An adjacent lymph node candidate is not grossly appreciated. Bile present : There is a scant amount of yellow-green viscid bile. Stones present: There are multiple black-yellow, firm, multifaceted gallstones, 4.6 x 3.2 x 1.6 cm in aggregate. Other findings: None grossly appreciated.  Block summary: 1 - cystic duct resection margin, en face and inked blue, and representative gallbladder wall with hemorrhage and flattening    Final Diagnosis performed by Quay Burow, MD.   Electronically signed 07/18/2020 4:30:27PM The electronic signature indicates that  the named Attending Pathologist has evaluated the specimen Technical component performed at [facility], [address], [place], [place] [address] Lab: [phone] Dir: Rush Farmer, MD, MMM  Professional component performed at [facility], [facility], [place], [place], [place] [address] Lab: [phone] Dir: [place] Nims. Rubinas, MD   Comprehensive metabolic panel     Status: Abnormal   Collection Time: 07/19/20  9:26 PM  Result Value Ref Range   Sodium 142 135 - 145 mmol/L   Potassium 3.4 (L) 3.5 - 5.1 mmol/L   Chloride 102 98 - 111 mmol/L   CO2 28 22 - 32 mmol/L   Glucose, Bld 115 (H) 70 - 99 mg/dL    Comment: Glucose reference range applies only to samples taken after fasting for at least 8 hours.   BUN 6 6 - 20 mg/dL   Creatinine, Ser 0.68 0.44 - 1.00 mg/dL   Calcium 9.2 8.9 - 10.3 mg/dL   Total Protein 8.3 (H) 6.5 - 8.1 g/dL   Albumin 4.3 3.5 - 5.0 g/dL   AST 22 15 - 41 U/L   ALT 33 0 - 44 U/L   Alkaline  Phosphatase 70 38 - 126 U/L   Total Bilirubin 0.7 0.3 - 1.2 mg/dL   GFR calc non Af Amer >60 >60 mL/min   GFR calc Af Amer >60 >60 mL/min   Anion gap 12 5 - 15    Comment: Performed at [facility], [address] [place], [place] [address]  CBC with Differential     Status: None   Collection Time: 07/19/20  9:26 PM  Result Value Ref Range   WBC 10.5 4.0 - 10.5 K/uL   RBC 4.51 3.87 - 5.11 MIL/uL   Hemoglobin 14.4 12.0 - 15.0 g/dL   HCT 42.4 36 - 46 %   MCV 94.0 80.0 - 100.0 fL   MCH 31.9 26.0 - 34.0 pg   MCHC 34.0 30.0 - 36.0 g/dL   RDW 12.5 11.5 - 15.5 %   Platelets 285 150 - 400 K/uL   nRBC 0.0 0.0 - 0.2 %   Neutrophils Relative % 68 %   Neutro Abs 7.1 1.7 - 7.7 K/uL   Lymphocytes Relative 24 %   Lymphs Abs 2.5 0.7 - 4.0 K/uL   Monocytes Relative 5 %   Monocytes Absolute 0.6 0 - 1 K/uL   Eosinophils Relative 2 %   Eosinophils Absolute 0.2 0 - 0 K/uL   Basophils Relative 0 %   Basophils Absolute 0.0 0 - 0 K/uL   Immature Granulocytes 1 %   Abs Immature Granulocytes 0.07 0.00 - 0.07 K/uL    Comment: Performed at [facility], [address] [place], [place] [address]  Urinalysis, Complete w Microscopic     Status: Abnormal   Collection Time: 07/19/20  9:26 PM  Result Value Ref Range   Color, Urine STRAW (A) YELLOW   APPearance CLEAR (A) CLEAR   Specific Gravity, Urine 1.003 (L) 1.005 - 1.030   pH 7.0 5.0 - 8.0   Glucose, UA NEGATIVE NEGATIVE mg/dL   Hgb urine dipstick SMALL (A) NEGATIVE   Bilirubin Urine NEGATIVE NEGATIVE   Ketones, ur NEGATIVE NEGATIVE mg/dL   Protein, ur NEGATIVE NEGATIVE mg/dL   Nitrite NEGATIVE NEGATIVE   Leukocytes,Ua NEGATIVE NEGATIVE   WBC, UA 0-5 0 - 5 WBC/hpf   Bacteria, UA NONE SEEN NONE SEEN   Squamous Epithelial / LPF 0-5 0 - 5    Comment: Performed at  [place], [place] [address]  Troponin I (High Sensitivity)     Status: None   Collection Time: 07/19/20  9:26 PM  Result Value  Ref Range   Troponin I (High Sensitivity) 5 <18 ng/L    Comment: (NOTE) Elevated high sensitivity troponin I (hsTnI) values and significant  changes across serial measurements may suggest ACS but many other  chronic and acute conditions are known to elevate hsTnI results.  Refer to the "Links" section for chest pain algorithms and additional  guidance. Performed at [facility], [address] [place], [place] [address]   Pregnancy, urine POC     Status: None   Collection Time: 07/19/20  9:27 PM  Result Value Ref Range   Preg Test, Ur NEGATIVE NEGATIVE    Comment:        THE SENSITIVITY OF THIS METHODOLOGY IS >24 mIU/mL     -------------------------------------------------------------------------- A&P:  Problem List Items Addressed This Visit      Respiratory   Acute non-recurrent sinusitis - Primary    Acute sinusitis, had sinusitis 06/13/2020 and was tx with Augmentin will full symptom resolution.  Has had a few days of sinus pain/pressure with purulent nasal drainage along with blood tinged nasal drainage.  Encouraged to stop the flonase, as is likely causing the nasal irritation, will send in Cefdinir 300mg  BID x 10 days and encouraged to begin mucinex according to packaging directions.  Plan: 1. Begin cefdinir 300mg  BID x 10 days 2. Begin mucinex, according to packaging directions to help thin mucus 3. Increase water intake 4. Hold flonase nasal spray and can begin saline nasal spray to help with nasal irritation 5. RTC if symptoms worsen or fail to improve with current treatment plan      Relevant Medications   cefdinir (OMNICEF) 300 MG capsule      Meds ordered this encounter  Medications  . cefdinir (OMNICEF) 300 MG capsule    Sig: Take 1 capsule (300 mg total) by mouth 2 (two) times daily for 10 days.    Dispense:  20 capsule    Refill:  0    Follow-up: - Return if symptoms worsen or fail to improve with current treatment  Patient verbalizes  understanding with the above medical recommendations including the limitation of remote medical advice.  Specific follow-up and call-back criteria were given for patient to follow-up or seek medical care more urgently if needed.  - Time spent in direct consultation with patient on video: 8 minutes  Harlin Rain, [facility] 08/24/2020, 11:52 AM

## 2020-08-25 ENCOUNTER — Ambulatory Visit (INDEPENDENT_AMBULATORY_CARE_PROVIDER_SITE_OTHER): Payer: Medicare Other | Admitting: Surgery

## 2020-08-25 ENCOUNTER — Encounter: Payer: Self-pay | Admitting: Surgery

## 2020-08-25 VITALS — BP 141/82 | HR 89 | Temp 98.2°F | Ht 67.0 in | Wt 276.0 lb

## 2020-08-25 DIAGNOSIS — Z9049 Acquired absence of other specified parts of digestive tract: Secondary | ICD-10-CM

## 2020-08-25 NOTE — Progress Notes (Signed)
[facility] POST-OP OFFICE VISIT  08/25/2020  HPI: Amanda Webb is a 49 y.o. female 10 days s/p robotic cholecystectomy.  She reports some soreness and achiness involving the right costal margin.  She reports she often sits at her desk and notes exacerbating that discomfort there.  She admits to a slight degree of nausea.  She was recently started on antibiotics for a sinus infection that presented itself has headache initially.  Suspected this may be contributing to the looseness of her stools.  She does admit she has a hard time denying herself certain creams and dairy products and has had some loose watery stools as a result.  She denies fevers and chills.  Vital signs: BP (!) 141/82   Pulse 89   Temp 98.2 F (36.8 C)   Ht 5\' 7"  (1.702 m)   Wt 276 lb (125.2 kg)   SpO2 96%   BMI 43.23 kg/m    Physical Exam: Constitutional: She appears well. Abdomen: Soft nontender.  Reproducible right costal margin tenderness. Skin: Incisions are clean, dry and intact.  Assessment/Plan: This is a 49 y.o. female 10 days s/p robotic cholecystectomy for chronic calculus cholecystitis.  Patient Active Problem List   Diagnosis Date Noted  . Acute non-recurrent sinusitis 08/24/2020  . Status post laparoscopic cholecystectomy 07/28/2020  . Morbid obesity ([place]) 07/05/2020  . Hepatic steatosis 06/23/2020  . Lymphedema 03/09/2020  . Chronic venous insufficiency 03/09/2020  . Bilateral lower extremity edema 02/16/2020  . Female cystocele 10/21/2018  . Urinary incontinence, mixed 10/21/2018  . B12 deficiency 04/08/2018  . Iron deficiency anemia 03/14/2018  . GAD (generalized anxiety disorder) 03/10/2018  . Acute right-sided low back pain 10/28/2017  . Tobacco abuse 05/09/2017  . Multiple thyroid nodules 12/28/2016  . Simple chronic bronchitis ([place]) 12/13/2016  . BMI 40.0-44.9, adult ([place]) 09/26/2016  . HLD (hyperlipidemia) 09/26/2016  . Vitamin D deficiency 09/26/2016  .  Pre-diabetes 06/17/2015  . Candidal intertrigo 02/21/2015  . Candidiasis of skin and nail 02/21/2015  . Cellulitis and abscess of leg 02/18/2015  . Benign cyst of right kidney 01/17/2015  . Wheezing 01/11/2015  . Chronic sinusitis 11/15/2014  . Whiplash injury 09/03/2014  . Heart palpitations 04/02/2014  . Lower urinary tract infectious disease 02/16/2014  . Allergic rhinitis 12/31/2013  . Spondylosis of lumbar region without myelopathy or radiculopathy 12/28/2013  . Diarrhea 11/09/2013  . Chronic low back pain 09/28/2013  . Other fatigue 09/28/2013  . Hypothyroidism 09/28/2013  . Alteration of body temperature 09/28/2013  . Plantar fasciitis, bilateral 07/10/2013  . Tarsal tunnel syndrome of right side 07/10/2013  . Plantar fascial fibromatosis 07/10/2013  . Encounter for surveillance of injectable contraceptive 05/13/2013  . Encounter for Depo-Provera contraception 05/13/2013  . Diuresis excessive 03/24/2013  . Abnormal uterine and vaginal bleeding, unspecified 03/24/2013  . Polyuria 03/24/2013  . Arthritis, multiple joint involvement 03/17/2013  . Fibroblastic disorder 07/28/2012  . Borderline personality disorder ([place]) 12/17/2010  . Essential hypertension 02/18/2010  . Carpal tunnel syndrome on both sides 12/17/2006  . Major depressive disorder, single episode 02/21/1998  . Disk prolapse 04/17/1995  . Depression with anxiety 03/18/1995  . OCD (obsessive compulsive disorder) 03/18/1995  . Severe anxiety with panic 03/18/1995    -We discussed costochondritis and the role of NSAIDs.  We also discussed long-term fatty food intolerance and better to avoid certain foods rather than add additional medications to try to help ameliorate the symptoms.  I believe she understands this, has had her questions adequately addressed  and will follow up with [place] as needed.   Ronny Bacon M.D., FACS 08/25/2020, 11:52 AM

## 2020-08-25 NOTE — Patient Instructions (Addendum)
May take imodium for any excessive diarrhea.  Do take a fiber supplement daily to help with loose stools. You should have about 30 grams a day of fiber a day through diet and supplementation.   You body may take a few months to adjust to not having a gallbladder.   Follow-up with our office as needed.  Please call and ask to speak with a nurse if you develop questions or concerns.   GENERAL POST-OPERATIVE PATIENT INSTRUCTIONS   WOUND CARE INSTRUCTIONS:  Keep a dry clean dressing on the wound if there is drainage. The initial bandage may be removed after 24 hours.  Once the wound has quit draining you may leave it open to air.  If clothing rubs against the wound or causes irritation and the wound is not draining you may cover it with a dry dressing during the daytime.  Try to keep the wound dry and avoid ointments on the wound unless directed to do so.  If the wound becomes bright red and painful or starts to drain infected material that is not clear, please contact your physician immediately.  If the wound is mildly pink and has a thick firm ridge underneath it, this is normal, and is referred to as a healing ridge.  This will resolve over the next 4-6 weeks.  BATHING: You may shower if you have been informed of this by your surgeon. However, Please do not submerge in a tub, hot tub, or pool until incisions are completely sealed or have been told by your surgeon that you may do so.  DIET:  You may eat any foods that you can tolerate.  It is a good idea to eat a high fiber diet and take in plenty of fluids to prevent constipation.  If you do become constipated you may want to take a mild laxative or take ducolax tablets on a daily basis until your bowel habits are regular.  Constipation can be very uncomfortable, along with straining, after recent surgery.  ACTIVITY:  You are encouraged to cough and deep breath or use your incentive spirometer if you were given one, every 15-30 minutes when  awake.  This will help prevent respiratory complications and low grade fevers post-operatively if you had a general anesthetic.  You may want to hug a pillow when coughing and sneezing to add additional support to the surgical area, if you had abdominal or chest surgery, which will decrease pain during these times.  You are encouraged to walk and engage in light activity for the next two weeks.  You should not lift more than 20 pounds for 6 weeks after surgery as it could put you at increased risk for complications.  Twenty pounds is roughly equivalent to a plastic bag of groceries. At that time- Listen to your body when lifting, if you have pain when lifting, stop and then try again in a few days. Soreness after doing exercises or activities of daily living is normal as you get back in to your normal routine.  MEDICATIONS:  Try to take narcotic medications and anti-inflammatory medications, such as tylenol, ibuprofen, naprosyn, etc., with food.  This will minimize stomach upset from the medication.  Should you develop nausea and vomiting from the pain medication, or develop a rash, please discontinue the medication and contact your physician.  You should not drive, make important decisions, or operate machinery when taking narcotic pain medication.  SUNBLOCK Use sun block to incision area over the next year if this area  will be exposed to sun. This helps decrease scarring and will allow you avoid a permanent darkened area over your incision.  QUESTIONS:  Please feel free to call our office if you have any questions, and we will be glad to assist you.

## 2020-08-31 ENCOUNTER — Other Ambulatory Visit: Payer: Self-pay

## 2020-08-31 ENCOUNTER — Inpatient Hospital Stay: Payer: Medicare Other | Attending: Hematology and Oncology

## 2020-08-31 DIAGNOSIS — E538 Deficiency of other specified B group vitamins: Secondary | ICD-10-CM | POA: Diagnosis not present

## 2020-08-31 DIAGNOSIS — D509 Iron deficiency anemia, unspecified: Secondary | ICD-10-CM

## 2020-08-31 MED ORDER — SODIUM CHLORIDE 0.9 % IV SOLN
Freq: Once | INTRAVENOUS | Status: DC
  Filled 2020-08-31: qty 250

## 2020-08-31 MED ORDER — IRON SUCROSE 20 MG/ML IV SOLN: 200.0000 mg | Freq: Once | INTRAVENOUS | Status: DC

## 2020-08-31 MED ORDER — CYANOCOBALAMIN 1000 MCG/ML IJ SOLN
1000.0000 ug | Freq: Once | INTRAMUSCULAR | Status: AC
  Administered 2020-08-31: 1000 ug via INTRAMUSCULAR
  Filled 2020-08-31: qty 1

## 2020-08-31 MED ORDER — SODIUM CHLORIDE 0.9 % IV SOLN: 200.0000 mg | Freq: Once | INTRAVENOUS | Status: DC

## 2020-09-01 DIAGNOSIS — Z1152 Encounter for screening for COVID-19: Secondary | ICD-10-CM | POA: Diagnosis not present

## 2020-09-01 DIAGNOSIS — Z20822 Contact with and (suspected) exposure to covid-19: Secondary | ICD-10-CM | POA: Diagnosis not present

## 2020-09-01 DIAGNOSIS — F4310 Post-traumatic stress disorder, unspecified: Secondary | ICD-10-CM | POA: Diagnosis not present

## 2020-09-01 DIAGNOSIS — Z03818 Encounter for observation for suspected exposure to other biological agents ruled out: Secondary | ICD-10-CM | POA: Diagnosis not present

## 2020-09-02 ENCOUNTER — Ambulatory Visit: Payer: Medicare Other

## 2020-09-07 ENCOUNTER — Ambulatory Visit (INDEPENDENT_AMBULATORY_CARE_PROVIDER_SITE_OTHER): Payer: Medicare Other

## 2020-09-07 ENCOUNTER — Other Ambulatory Visit: Payer: Self-pay

## 2020-09-07 DIAGNOSIS — Z23 Encounter for immunization: Secondary | ICD-10-CM | POA: Diagnosis not present

## 2020-09-27 ENCOUNTER — Ambulatory Visit (INDEPENDENT_AMBULATORY_CARE_PROVIDER_SITE_OTHER): Payer: Medicare Other | Admitting: Obstetrics and Gynecology

## 2020-09-27 ENCOUNTER — Encounter: Payer: Self-pay | Admitting: Obstetrics and Gynecology

## 2020-09-27 ENCOUNTER — Other Ambulatory Visit: Payer: Self-pay | Admitting: Family Medicine

## 2020-09-27 ENCOUNTER — Other Ambulatory Visit (HOSPITAL_COMMUNITY)
Admission: RE | Admit: 2020-09-27 | Discharge: 2020-09-27 | Disposition: A | Discharge status: Home / Self Care | Payer: Medicare Other | Source: Ambulatory Visit | Attending: Obstetrics and Gynecology | Admitting: Obstetrics and Gynecology

## 2020-09-27 ENCOUNTER — Other Ambulatory Visit: Payer: Self-pay

## 2020-09-27 VITALS — BP 138/68 | HR 79 | Ht 67.0 in | Wt 270.4 lb

## 2020-09-27 DIAGNOSIS — Z3042 Encounter for surveillance of injectable contraceptive: Secondary | ICD-10-CM

## 2020-09-27 DIAGNOSIS — Z0000 Encounter for general adult medical examination without abnormal findings: Secondary | ICD-10-CM | POA: Diagnosis not present

## 2020-09-27 DIAGNOSIS — Z01419 Encounter for gynecological examination (general) (routine) without abnormal findings: Secondary | ICD-10-CM | POA: Insufficient documentation

## 2020-09-27 DIAGNOSIS — Z6841 Body Mass Index (BMI) 40.0 and over, adult: Secondary | ICD-10-CM | POA: Diagnosis not present

## 2020-09-27 DIAGNOSIS — I10 Essential (primary) hypertension: Secondary | ICD-10-CM

## 2020-09-27 DIAGNOSIS — Z124 Encounter for screening for malignant neoplasm of cervix: Secondary | ICD-10-CM

## 2020-09-27 DIAGNOSIS — Z1151 Encounter for screening for human papillomavirus (HPV): Secondary | ICD-10-CM | POA: Insufficient documentation

## 2020-09-27 DIAGNOSIS — Z1231 Encounter for screening mammogram for malignant neoplasm of breast: Secondary | ICD-10-CM

## 2020-09-27 NOTE — Addendum Note (Signed)
Addended by: Durwin Glaze on: 09/27/2020 02:39 PM   Modules accepted: Orders

## 2020-09-27 NOTE — Progress Notes (Signed)
HPI:      [name] is a 49 y.o. [phone] who LMP was No LMP recorded (lmp unknown). Patient has had an injection.  Subjective:   She presents today for her annual examination.  She had gallbladder surgery in July and says that she is still not completely recovered from it. She continues to be amenorrheic on Depo.  We discussed discontinuation of Depo for bone loss issues but her bleeding issues seem to supersede this.  She really does not want to discontinue Depo. She says that she has significant concerns for cancer because it occurs frequently in her family.  Both her mother and father have lung cancer (both used tobacco products).  Of significant note patient also smokes cigarettes. Patient reports no further issues or concerns regarding STDs.    Hx: The following portions of the patient's history were reviewed and updated as appropriate:             She  has a past medical history of Allergy, Anemia, Anxiety, Arrhythmia, Arthritis, Cervical dysplasia, Cystitis, Depression, GERD (gastroesophageal reflux disease), Gross hematuria, Headache, Heart murmur, History of methicillin resistant staphylococcus aureus (MRSA) (2013), HLD (hyperlipidemia), Hypertension, Hypothyroid, Lymphedema, Palpitations, Pernicious anemia, and Tobacco abuse. She does not have any pertinent problems on file. She  has a past surgical history that includes Foot surgery (Right, 2013); Carpal tunnel release (Bilateral, 2011); Tonsillectomy (1979); and Robotic assisted laparoscopic cholecystectomy (07/15/2020). Her family history includes Alcohol abuse in her mother; Cancer in her maternal grandmother; Depression in her father, mother, and paternal grandfather; Diabetes in her father and maternal grandmother; Drug abuse in her father and paternal grandfather; Kidney disease in her maternal grandfather; Lung cancer in her mother; Mental illness in her father and mother; Ovarian cancer in her maternal  grandmother. She  reports that she has been smoking cigarettes. She has a 15.00 pack-year smoking history. She has never used smokeless tobacco. She reports current alcohol use. She reports that she does not use drugs. She has a current medication list which includes the following prescription(s): accu-chek fastclix lancets, accu-chek guide, acetaminophen, albuterol, aripiprazole, atenolol, cetirizine hcl, cyanocobalamin, fluticasone, fluvoxamine, gabapentin, ketoconazole, lamotrigine, levothyroxine, medroxyprogesterone, multivitamin with minerals, pantoprazole, and tretinoin. She is allergic to bee venom, cat hair extract, tetracyclines & related, lac bovis, lactose, milk protein, and tape.       Review of Systems:  Review of Systems  Constitutional: Denied constitutional symptoms, night sweats, recent illness, fatigue, fever, insomnia and weight loss.  Eyes: Denied eye symptoms, eye pain, photophobia, vision change and visual disturbance.  Ears/Nose/Throat/Neck: Denied ear, nose, throat or neck symptoms, hearing loss, nasal discharge, sinus congestion and sore throat.  Cardiovascular: Denied cardiovascular symptoms, arrhythmia, chest pain/pressure, edema, exercise intolerance, orthopnea and palpitations.  Respiratory: Denied pulmonary symptoms, asthma, pleuritic pain, productive sputum, cough, dyspnea and wheezing.  Gastrointestinal: Denied, gastro-esophageal reflux, melena, nausea and vomiting.  Genitourinary: Denied genitourinary symptoms including symptomatic vaginal discharge, pelvic relaxation issues, and urinary complaints.  Musculoskeletal: Denied musculoskeletal symptoms, stiffness, swelling, muscle weakness and myalgia.  Dermatologic: Denied dermatology symptoms, rash and scar.  Neurologic: Denied neurology symptoms, dizziness, headache, neck pain and syncope.  Psychiatric: Denied psychiatric symptoms, anxiety and depression.  Endocrine: Denied endocrine symptoms including hot flashes  and night sweats.   Meds:   Current Outpatient Medications on File Prior to Visit  Medication Sig Dispense Refill  . Accu-Chek FastClix Lancets MISC USE TO CHECK SUGAR UP TO TWICE DAILY AS DIRECTED 102 each 5  . ACCU-CHEK GUIDE  test strip CHECK BLOOD SUGAR UP TO TWICE DAILY AS DIRECTED 100 strip 5  . acetaminophen (TYLENOL) 500 MG tablet Take 1,000 mg by mouth every 6 (six) hours as needed for moderate pain or headache.    . ARIPiprazole (ABILIFY) 2 MG tablet Take 2 mg by mouth every morning.     Marland Kitchen atenolol (TENORMIN) 50 MG tablet Take 0.5 tablets (25 mg total) by mouth daily. (Patient taking differently: Take 25 mg by mouth every morning. ) 45 tablet 3  . Cetirizine HCl (ZYRTEC ALLERGY) 10 MG CAPS Take 10 mg by mouth daily with lunch.     . Cyanocobalamin (B-12 COMPLIANCE INJECTION IJ) Inject 1 Dose as directed every 30 (thirty) days.    . fluticasone (FLONASE) 50 MCG/ACT nasal spray SHAKE LIQUID AND USE 2 SPRAYS IN EACH NOSTRIL EVERY DAY (Patient taking differently: Place 2 sprays into both nostrils daily. ) 48 g 1  . fluvoxaMINE (LUVOX) 100 MG tablet Take 150 mg by mouth every morning.     . gabapentin (NEURONTIN) 100 MG capsule Take 200 mg by mouth 3 (three) times daily.     Marland Kitchen ketoconazole (NIZORAL) 2 % shampoo APPLY EXTERNALLY 2 TIMES A WEEK 120 mL 2  . lamoTRIgine (LAMICTAL) 200 MG tablet Take 200 mg by mouth every morning.     Marland Kitchen levothyroxine (SYNTHROID) 50 MCG tablet TAKE 1 TABLET BY MOUTH EVERY MORNING (Patient taking differently: Take 50 mcg by mouth daily before breakfast. ) 90 tablet 3  . medroxyPROGESTERone (DEPO-PROVERA) 150 MG/ML injection ADMINISTER 1 ML(150 MG) IN THE MUSCLE EVERY 3 MONTHS (Patient taking differently: Inject 150 mg into the muscle every 3 (three) months. ) 1 mL 1  . Multiple Vitamin (MULTIVITAMIN WITH MINERALS) TABS tablet Take 1 tablet by mouth daily.    . pantoprazole (PROTONIX) 20 MG tablet TAKE 1 TABLET(20 MG) BY MOUTH DAILY 90 tablet 3  . tretinoin  (RETIN-A) 0.025 % cream APPLY EXTERNALLY TO THE AFFECTED AREA AT BEDTIME AS NEEDED (Patient taking differently: Apply 1 application topically at bedtime as needed (acne). ) 45 g 2   No current facility-administered medications on file prior to visit.          Objective:     Vitals:   09/27/20 1335  BP: 138/68  Pulse: 79    Filed Weights   09/27/20 1335  Weight: 270 lb 6.4 oz (122.7 kg)              Physical examination General NAD, Conversant  HEENT Atraumatic; Op clear with mmm.  Normo-cephalic. Pupils reactive. Anicteric sclerae  Thyroid/Neck Smooth without nodularity or enlargement. Normal ROM.  Neck Supple.  Skin No rashes, lesions or ulceration. Normal palpated skin turgor. No nodularity.  Breasts: No masses or discharge.  Symmetric.  No axillary adenopathy.  Lungs: Clear to auscultation.No rales or wheezes. Normal Respiratory effort, no retractions.  Heart: NSR.  No murmurs or rubs appreciated. No periferal edema  Abdomen: Soft.  Non-tender.  No masses.  No HSM. No hernia  Extremities: Moves all appropriately.  Normal ROM for age. No lymphadenopathy.  Neuro: Oriented to PPT.  Normal mood. Normal affect.     Pelvic:   Vulva: Normal appearance.  No lesions.  Vagina: No lesions or abnormalities noted.  Support: Normal pelvic support.  Urethra No masses tenderness or scarring.  Meatus Normal size without lesions or prolapse.  Cervix: Normal appearance.  No lesions.  Anus: Normal exam.  No lesions.  Perineum: Normal exam.  No lesions.  Bimanual   Uterus: Normal size.  Non-tender.  Mobile.  AV.  Adnexae: No masses.  Non-tender to palpation.  Cul-de-sac: Negative for abnormality.    Exam limited by patient body habitus  Assessment:    [id number] Patient Active Problem List   Diagnosis Date Noted  . Acute non-recurrent sinusitis 08/24/2020  . Status post laparoscopic cholecystectomy 07/28/2020  . Morbid obesity ([place]) 07/05/2020  . Hepatic steatosis  06/23/2020  . Lymphedema 03/09/2020  . Chronic venous insufficiency 03/09/2020  . Bilateral lower extremity edema 02/16/2020  . Female cystocele 10/21/2018  . Urinary incontinence, mixed 10/21/2018  . B12 deficiency 04/08/2018  . Iron deficiency anemia 03/14/2018  . GAD (generalized anxiety disorder) 03/10/2018  . Acute right-sided low back pain 10/28/2017  . Tobacco abuse 05/09/2017  . Multiple thyroid nodules 12/28/2016  . Simple chronic bronchitis ([place]) 12/13/2016  . BMI 40.0-44.9, adult ([place]) 09/26/2016  . HLD (hyperlipidemia) 09/26/2016  . Vitamin D deficiency 09/26/2016  . Pre-diabetes 06/17/2015  . Candidal intertrigo 02/21/2015  . Candidiasis of skin and nail 02/21/2015  . Cellulitis and abscess of leg 02/18/2015  . Benign cyst of right kidney 01/17/2015  . Wheezing 01/11/2015  . Chronic sinusitis 11/15/2014  . Whiplash injury 09/03/2014  . Heart palpitations 04/02/2014  . Lower urinary tract infectious disease 02/16/2014  . Allergic rhinitis 12/31/2013  . Spondylosis of lumbar region without myelopathy or radiculopathy 12/28/2013  . Diarrhea 11/09/2013  . Chronic low back pain 09/28/2013  . Other fatigue 09/28/2013  . Hypothyroidism 09/28/2013  . Alteration of body temperature 09/28/2013  . Plantar fasciitis, bilateral 07/10/2013  . Tarsal tunnel syndrome of right side 07/10/2013  . Plantar fascial fibromatosis 07/10/2013  . Encounter for surveillance of injectable contraceptive 05/13/2013  . Encounter for Depo-Provera contraception 05/13/2013  . Diuresis excessive 03/24/2013  . Abnormal uterine and vaginal bleeding, unspecified 03/24/2013  . Polyuria 03/24/2013  . Arthritis, multiple joint involvement 03/17/2013  . Fibroblastic disorder 07/28/2012  . Borderline personality disorder ([place]) 12/17/2010  . Essential hypertension 02/18/2010  . Carpal tunnel syndrome on both sides 12/17/2006  . Major depressive disorder, single episode 02/21/1998  . Disk prolapse  04/17/1995  . Depression with anxiety 03/18/1995  . OCD (obsessive compulsive disorder) 03/18/1995  . Severe anxiety with panic 03/18/1995     1. Well woman exam with routine gynecological exam   2. Screening mammogram for breast cancer   3. Surveillance for Depo-Provera contraception   4. Class 3 severe obesity due to excess calories without serious comorbidity with body mass index (BMI) of 40.0 to 44.9 in adult [facility])        Plan:            1.  Basic Screening Recommendations The basic screening recommendations for asymptomatic women were discussed with the patient during her visit.  The age-appropriate recommendations were discussed with her and the rational for the tests reviewed.  When I am informed by the patient that another primary care physician has previously obtained the age-appropriate tests and they are up-to-date, only outstanding tests are ordered and referrals given as necessary.  Abnormal results of tests will be discussed with her when all of her results are completed.  Routine preventative health maintenance measures emphasized: Exercise/Diet/Weight control, Tobacco Warnings, Alcohol/Substance use risks and Stress Management Pap performed-mammogram ordered-blood work ordered, patient will return when fasting 2.  Advised discontinuation of tobacco use 3.  Discussed cancer risk of multiple female cancers and discussed early diagnosis prevention breast cancer cervical cancer endometrial cancer  ovarian cancer. 4.  Patient has decided to continue Depo-Provera for bleeding issues 5.  Genetic screening for cancer discussed and patient states that she has already had this with her primary care physician.  Orders Orders Placed This Encounter  Procedures  . MM 3D SCREEN BREAST BILATERAL  . Hemoglobin A1c  . Lipid panel  . TSH    No orders of the defined types were placed in this encounter.           F/U  Return for Annual Physical. Every 3 months for Depo   Finis Bud, M.D. 09/27/2020 2:18 PM

## 2020-09-29 ENCOUNTER — Other Ambulatory Visit: Payer: Medicare Other

## 2020-09-29 DIAGNOSIS — F4310 Post-traumatic stress disorder, unspecified: Secondary | ICD-10-CM | POA: Diagnosis not present

## 2020-09-29 LAB — CYTOLOGY - PAP
Comment: NEGATIVE
Diagnosis: NEGATIVE
High risk HPV: NEGATIVE

## 2020-10-02 DIAGNOSIS — I10 Essential (primary) hypertension: Secondary | ICD-10-CM

## 2020-10-03 IMAGING — MR MRI LUMBAR SPINE WITHOUT CONTRAST
5 series · 33 of 48 positions shown · non-contrast
Comparison: None.

CLINICAL DATA: No known injury, right and left low back pain since
3003

EXAM:
MRI LUMBAR SPINE WITHOUT CONTRAST
TECHNIQUE: Multiplanar, multisequence MR imaging of the lumbar spine was
performed. No intravenous contrast was administered.

[Series 5: T2 · sagittal · 4.0mm · 1.02mm/px · 6 of 17 slices shown (1 of 2)]
[im 1/17]
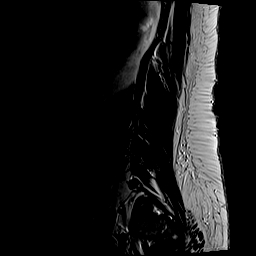
[im 4/17]
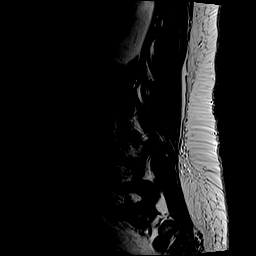
[im 7/17]
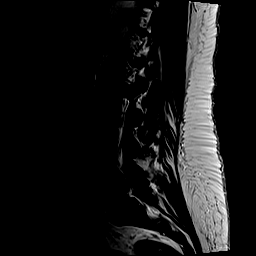
[im 10/17]
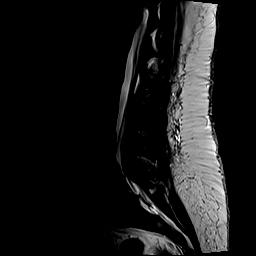
[im 13/17]
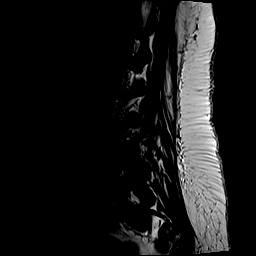
[im 17/17]
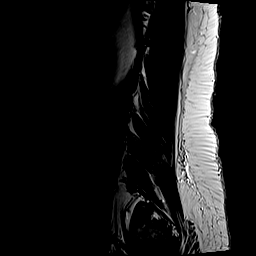

[Series 6: T1 · sagittal · 4.0mm · 1.02mm/px · 7 of 17 slices shown (1 of 2)]
[im 1/17]
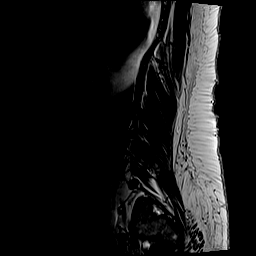
[im 3/17]
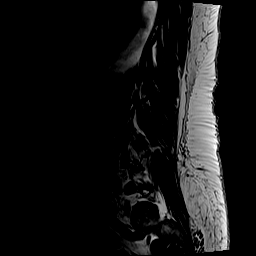
[im 6/17]
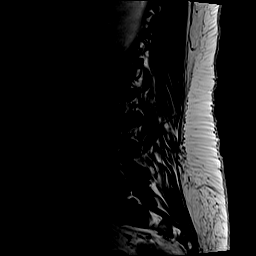
[im 9/17]
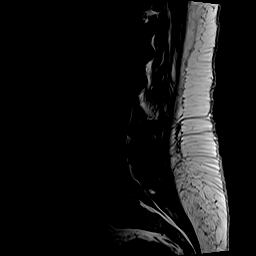
[im 11/17]
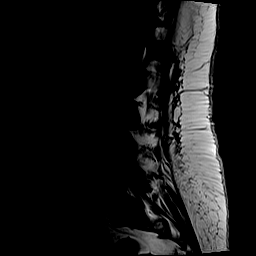
[im 14/17]
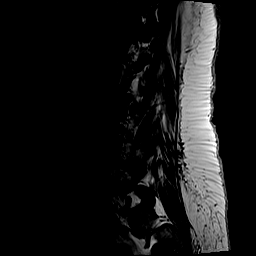
[im 17/17]
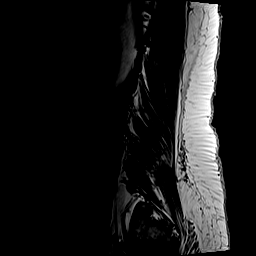

[Series 7: STIR · sagittal · 4.0mm · 0.51mm/px · 4 of 17 slices shown]
[im 1/17]
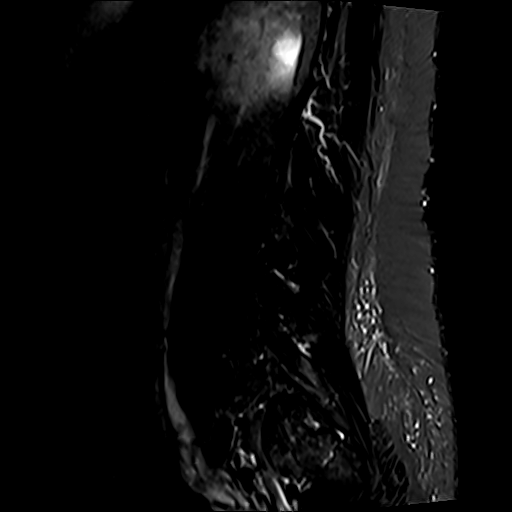
[im 3/17]
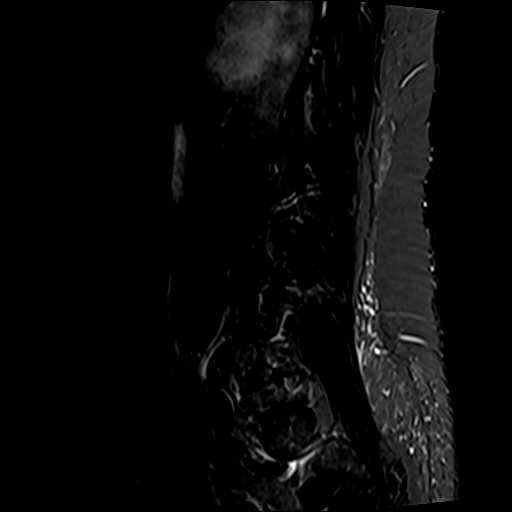
[im 6/17]
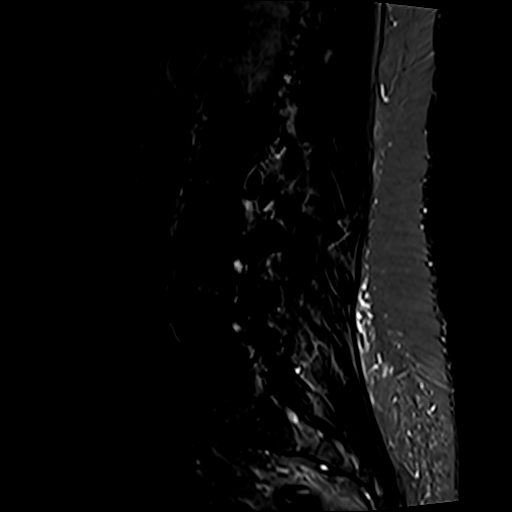
[im 9/17]
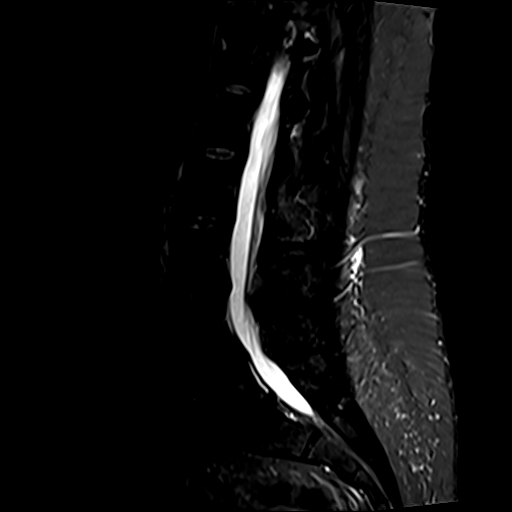

[Series 8: T2 · axial · 4.0mm · 0.78mm/px · z∈[-118,+110]mm · 8 of 36 slices shown (2 of 2)]
[im 1/36]
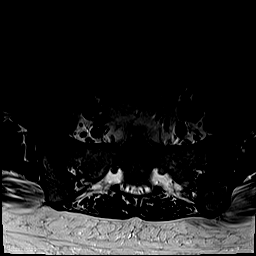
[im 6/36]
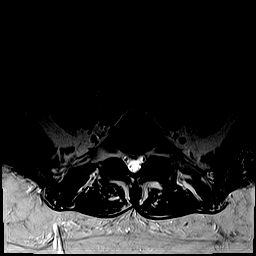
[im 11/36]
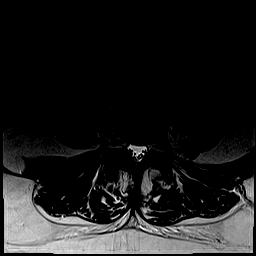
[im 17/36]
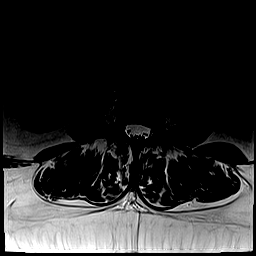
[im 19/36]
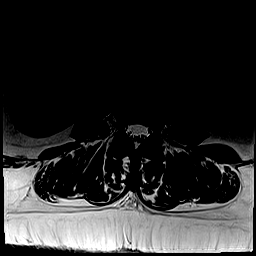
[im 25/36]
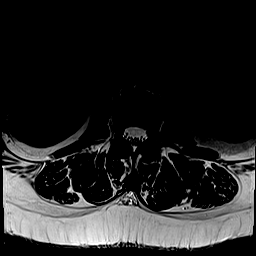
[im 30/36]
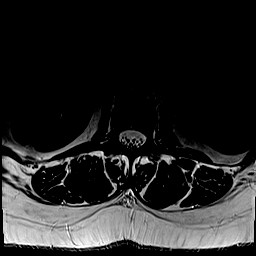
[im 36/36]
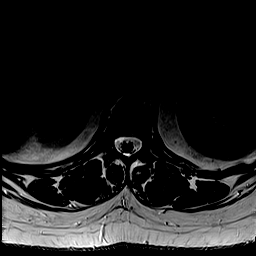

[Series 9: T1 · axial · 4.0mm · 0.39mm/px · z∈[-118,+110]mm · 8 of 36 slices shown (2 of 2)]
[im 1/36]
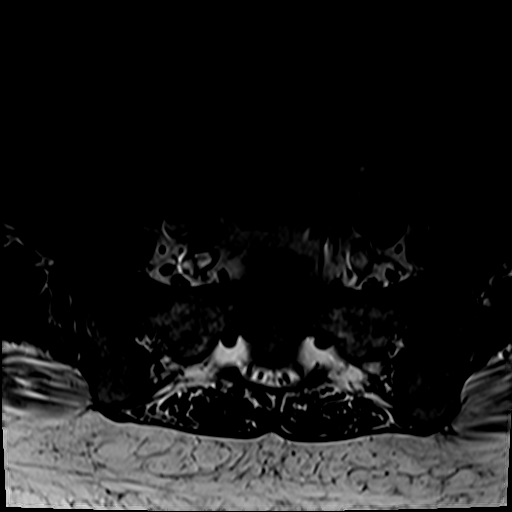
[im 6/36]
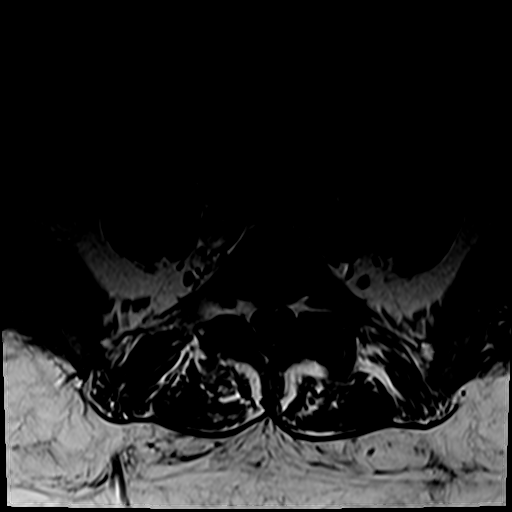
[im 11/36]
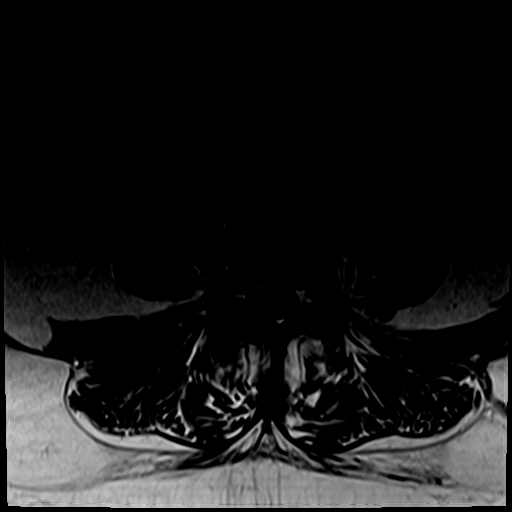
[im 17/36]
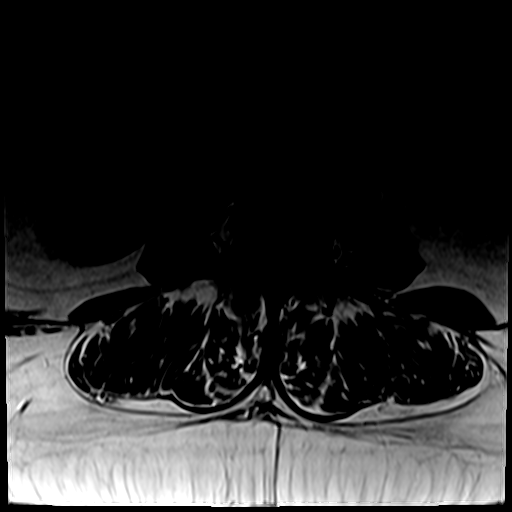
[im 19/36]
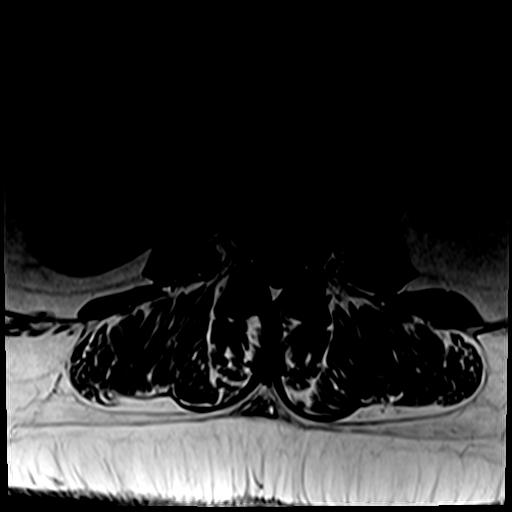
[im 25/36]
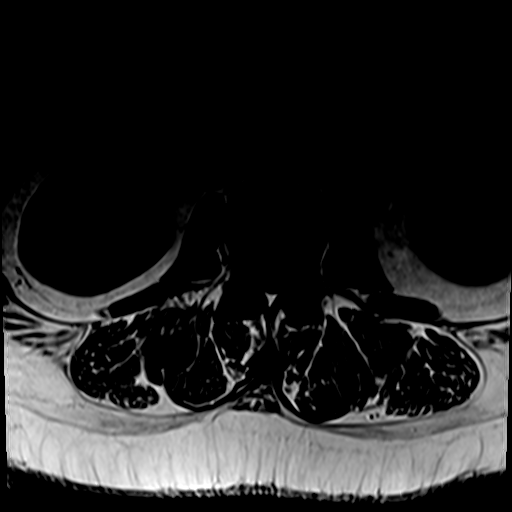
[im 30/36]
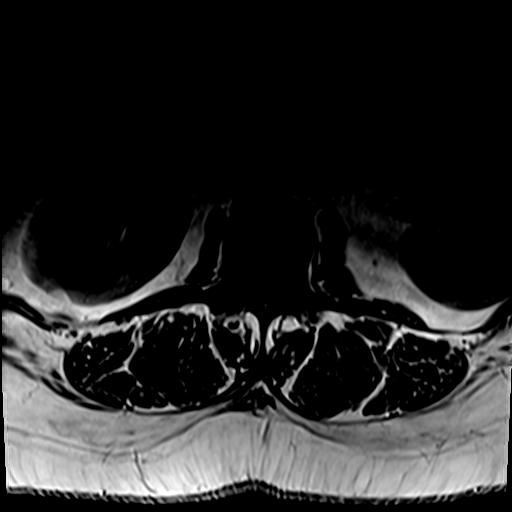
[im 36/36]
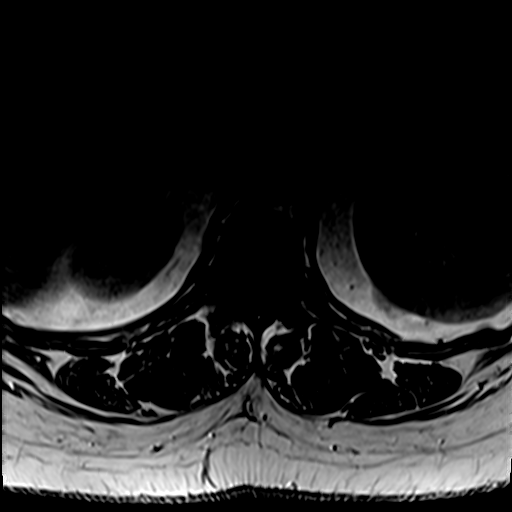

[33 of 48 positions shown; findings below may reference images not displayed]

FINDINGS: Segmentation:  Standard.

Alignment:  Physiologic.

Vertebrae:  No fracture, evidence of discitis, or bone lesion.

Conus medullaris and cauda equina: Conus extends to the T12 level.
Conus and cauda equina appear normal.

Paraspinal and other soft tissues: No acute paraspinal abnormality.

Disc levels:

Disc spaces: Disc desiccation at L2-3, L3-4, L4-5 and L5-S1.

T11-12: Broad-based disc bulge. No foraminal or central canal
stenosis.

T12-L1: No significant disc bulge. No evidence of neural foraminal
stenosis. No central canal stenosis.

L1-L2: Minimal broad-based disc bulge. No evidence of neural
foraminal stenosis. No central canal stenosis.

L2-L3: No significant disc bulge. No evidence of neural foraminal
stenosis. No central canal stenosis.

L3-L4: Mild broad-based disc bulge. Mild bilateral facet
arthropathy. No evidence of neural foraminal stenosis. No central
canal stenosis.

L4-L5: Mild broad-based disc bulge flattening the ventral thecal
sac. Severe bilateral facet arthropathy with ligamentum flavum
infolding. Bilateral lateral recess narrowing. Mild spinal stenosis.
No evidence of neural foraminal stenosis.

L5-S1: Broad-based disc bulge. Moderate left and mild right facet
arthropathy. Mild left foraminal stenosis. No right foraminal
stenosis. No central canal stenosis.
IMPRESSION: 1.  No acute osseous injury of the lumbar spine.
2. At L4-5 there is a mild broad-based disc bulge flattening the
ventral thecal sac. Severe bilateral facet arthropathy with
ligamentum flavum infolding. Bilateral lateral recess narrowing.
Mild spinal stenosis. No evidence of neural foraminal stenosis.
3. At L5-S1 there is a broad-based disc bulge. Moderate left and
mild right facet arthropathy. Mild left foraminal stenosis.

## 2020-10-04 ENCOUNTER — Ambulatory Visit: Payer: Medicare Other

## 2020-10-04 NOTE — Addendum Note (Signed)
Addended by: Olin Hauser on: 10/04/2020 08:11 AM   Modules accepted: Orders

## 2020-10-05 ENCOUNTER — Inpatient Hospital Stay: Payer: Medicare Other

## 2020-10-06 ENCOUNTER — Other Ambulatory Visit: Payer: Self-pay

## 2020-10-06 ENCOUNTER — Inpatient Hospital Stay: Payer: Medicare Other | Attending: Hematology and Oncology

## 2020-10-06 ENCOUNTER — Other Ambulatory Visit: Payer: Medicare Other

## 2020-10-06 VITALS — Wt 270.2 lb

## 2020-10-06 DIAGNOSIS — E538 Deficiency of other specified B group vitamins: Secondary | ICD-10-CM | POA: Diagnosis not present

## 2020-10-06 DIAGNOSIS — D509 Iron deficiency anemia, unspecified: Secondary | ICD-10-CM

## 2020-10-06 MED ORDER — CYANOCOBALAMIN 1000 MCG/ML IJ SOLN
1000.0000 ug | Freq: Once | INTRAMUSCULAR | Status: AC
  Administered 2020-10-06: 1000 ug via INTRAMUSCULAR
  Filled 2020-10-06: qty 1

## 2020-10-09 ENCOUNTER — Other Ambulatory Visit: Payer: Self-pay | Admitting: Family Medicine

## 2020-10-09 DIAGNOSIS — R7303 Prediabetes: Secondary | ICD-10-CM

## 2020-10-09 NOTE — Telephone Encounter (Signed)
Requested Prescriptions  Pending Prescriptions Disp Refills  . Accu-Chek FastClix Lancets MISC [Pharmacy Med Name: ACCU-CHEK FASTCLIX LANCETS [id number]] 838 each 5    Sig: USE TO CHECK SUGAR UP TO TWICE DAILY AS DIRECTED     Endocrinology: Diabetes - Testing Supplies Passed - 10/09/2020 10:14 AM      Passed - Valid encounter within last 12 months    Recent Outpatient Visits          1 month ago Acute non-recurrent sinusitis, unspecified location   [facility], FNP   3 months ago Colicky RUQ abdominal pain   Leland, DO   7 months ago Bilateral lower extremity edema   Kidder, DO   9 months ago Pain of finger of left hand   [facility] Olin Hauser, DO   1 year ago Acute non-recurrent pansinusitis   Rosebud, NP      Future Appointments            Tomorrow Parks Ranger, Devonne Doughty, [facility], [facility]

## 2020-10-10 ENCOUNTER — Other Ambulatory Visit: Payer: Self-pay

## 2020-10-10 ENCOUNTER — Ambulatory Visit
Admission: RE | Admit: 2020-10-10 | Discharge: 2020-10-10 | Disposition: A | Discharge status: Home / Self Care | Payer: Medicare Other | Source: Ambulatory Visit | Attending: Family Medicine | Admitting: Family Medicine

## 2020-10-10 ENCOUNTER — Ambulatory Visit (INDEPENDENT_AMBULATORY_CARE_PROVIDER_SITE_OTHER): Payer: Medicare Other | Admitting: Family Medicine

## 2020-10-10 ENCOUNTER — Encounter: Payer: Self-pay | Admitting: Family Medicine

## 2020-10-10 ENCOUNTER — Telehealth: Payer: Self-pay | Admitting: Family Medicine

## 2020-10-10 ENCOUNTER — Ambulatory Visit
Admission: RE | Admit: 2020-10-10 | Discharge: 2020-10-10 | Disposition: A | Discharge status: Home / Self Care | Payer: Medicare Other | Source: Home / Self Care | Attending: Family Medicine | Admitting: Family Medicine

## 2020-10-10 ENCOUNTER — Other Ambulatory Visit: Payer: Self-pay | Admitting: Family Medicine

## 2020-10-10 VITALS — BP 138/56 | HR 74 | Temp 97.7°F | Resp 16 | Ht 67.0 in | Wt 270.0 lb

## 2020-10-10 DIAGNOSIS — J984 Other disorders of lung: Secondary | ICD-10-CM

## 2020-10-10 DIAGNOSIS — R9389 Abnormal findings on diagnostic imaging of other specified body structures: Secondary | ICD-10-CM

## 2020-10-10 DIAGNOSIS — J0100 Acute maxillary sinusitis, unspecified: Secondary | ICD-10-CM

## 2020-10-10 DIAGNOSIS — Z720 Tobacco use: Secondary | ICD-10-CM | POA: Insufficient documentation

## 2020-10-10 DIAGNOSIS — R059 Cough, unspecified: Secondary | ICD-10-CM | POA: Diagnosis not present

## 2020-10-10 DIAGNOSIS — I10 Essential (primary) hypertension: Secondary | ICD-10-CM

## 2020-10-10 DIAGNOSIS — J90 Pleural effusion, not elsewhere classified: Secondary | ICD-10-CM | POA: Diagnosis not present

## 2020-10-10 DIAGNOSIS — R7303 Prediabetes: Secondary | ICD-10-CM

## 2020-10-10 DIAGNOSIS — E782 Mixed hyperlipidemia: Secondary | ICD-10-CM

## 2020-10-10 DIAGNOSIS — E039 Hypothyroidism, unspecified: Secondary | ICD-10-CM

## 2020-10-10 DIAGNOSIS — Z6841 Body Mass Index (BMI) 40.0 and over, adult: Secondary | ICD-10-CM

## 2020-10-10 MED ORDER — LEVOFLOXACIN 500 MG PO TABS: 500.0000 mg | ORAL_TABLET | Freq: Every day | ORAL | 0 refills | Status: DC

## 2020-10-10 MED ORDER — AZELASTINE HCL 0.1 % NA SOLN: 1.0000 | Freq: Two times a day (BID) | NASAL | 5 refills | Status: DC

## 2020-10-10 NOTE — Telephone Encounter (Signed)
Copied from [place] [id number]. Topic: General - Inquiry >> Oct 10, 2020  1:50 PM Gillis Ends D wrote: Reason for CRM: Patient would like some clarity on her X-ray and she would like a return call at [phone]. Please advise

## 2020-10-10 NOTE — Patient Instructions (Addendum)
Thank you for coming to the office today.  Start Levaquin antibiotic Caution with high impact activity risk tendon injury  Start Azelastine astelin nasal spray for allergies 1-2 sprays each nostril twice a day  Can also keep Flonase.  X-ray today  Keep working on quitting smoking   DUE for FASTING BLOOD WORK (no food or drink after midnight before the lab appointment, only water or coffee without cream/sugar on the morning of)  SCHEDULE "Lab Only" visit in the morning at the clinic for lab draw in 3-4 WEEKS   - Make sure Lab Only appointment is at about 1 week before your next appointment, so that results will be available  For Lab Results, once available within 2-3 days of blood draw, you can can log in to MyChart online to view your results and a brief explanation. Also, we can discuss results at next follow-up visit.  Please schedule a Follow-up Appointment to: Return in about 3 weeks (around 10/31/2020) for 3-4 weeks for fasting lab only then 1 week later for [facility].  If you have any other questions or concerns, please feel free to call the office or send a message through [place]. You may also schedule an earlier appointment if necessary.  Additionally, you may be receiving a survey about your experience at our office within a few days to 1 week by e-mail or mail. We value your feedback.  [name], DO [place]

## 2020-10-10 NOTE — Telephone Encounter (Signed)
Patient was already notified earlier about Chest X-ray result.  She has also sent MyChart message after that call trying to clarify the result.  I have sent a detailed response to answer her questions.  CT Chest without contrast has already been scheduled for 10/12/20.  Patient should get CT and we can follow-up results.  [name], [facility] 10/10/2020, 6:02 PM

## 2020-10-10 NOTE — Telephone Encounter (Signed)
Tin from [facility] radiology called and read reported result of DG chest 2 view. Attempted to contact office. Out to lunch Will send high priority message to office to Review CXR result  And recommendations in chart.

## 2020-10-10 NOTE — Progress Notes (Signed)
Subjective:    Patient ID: Amanda Webb, female    DOB: 30-Dec-1970, 49 y.o.   MRN: [id number]  Sidrah Harden is a 49 y.o. female presenting on 10/10/2020 for Sinusitis (onset week denies fever or SOB, cough mild from sinus drainage)   HPI   Sinusitis Reports symptoms onset with sinus congestion and pain and pressure bilateral maxillary now worsening over past week, with productive thicker phlegm and deeper chest congestion - She had seen [facility] in past, she used to be on allergy shots in past, never got sick while on that - Currently using Flonase but limited relief. - Active smoker, she worries and has fam history lung cancer. Denies fever chills dyspnea  Admits diarrhea post cholecystectomy, some weight loss  Health Maintenance: UTD Flu vaccine 09/07/20, UTD COVID vaccine 03/18/20  Depression screen [facility] 2/9 12/30/2019 07/07/2019 01/01/2019  Decreased Interest 0 1 0  Down, Depressed, Hopeless 0 1 0  PHQ - 2 Score 0 2 0  Altered sleeping - 3 3  Tired, decreased energy - 3 0  Change in appetite - 0 0  Feeling bad or failure about yourself  - 3 0  Trouble concentrating - 0 0  Moving slowly or fidgety/restless - 0 0  Suicidal thoughts - 0 0  PHQ-9 Score - 11 3  Difficult doing work/chores - Somewhat difficult Not difficult at all  Some recent data might be hidden    Social History   Tobacco Use  . Smoking status: Current Every Day Smoker    Packs/day: 0.50    Years: 30.00    Pack years: 15.00    Types: Cigarettes  . Smokeless tobacco: Never Used  Vaping Use  . Vaping Use: Never used  Substance Use Topics  . Alcohol use: Yes    Comment: wine occ  . Drug use: No    Review of Systems Per HPI unless specifically indicated above     Objective:    BP (!) 138/56   Pulse 74   Temp 97.7 F (36.5 C) (Temporal)   Resp 16   Ht 5\' 7"  (1.702 m)   Wt 270 lb (122.5 kg)   LMP  (LMP Unknown)   SpO2 98%   BMI 42.29 kg/m   Wt Readings from Last 3  Encounters:  10/10/20 270 lb (122.5 kg)  10/06/20 270 lb 3 oz (122.6 kg)  09/27/20 270 lb 6.4 oz (122.7 kg)    Physical Exam Vitals and nursing note reviewed.  Constitutional:      General: She is not in acute distress.    Appearance: She is well-developed. She is not diaphoretic.     Comments: Well-appearing, comfortable, cooperative  HENT:     Head: Normocephalic and atraumatic.  Eyes:     General:        Right eye: No discharge.        Left eye: No discharge.     Conjunctiva/sclera: Conjunctivae normal.  Neck:     Thyroid: No thyromegaly.  Cardiovascular:     Rate and Rhythm: Normal rate and regular rhythm.     Heart sounds: Normal heart sounds. No murmur heard.   Pulmonary:     Effort: Pulmonary effort is normal. No respiratory distress.     Breath sounds: Normal breath sounds. No wheezing or rales.     Comments: No coughing. Good air movement. Musculoskeletal:        General: Normal range of motion.     Cervical back: Normal  range of motion and neck supple.     Right lower leg: No edema.     Left lower leg: No edema.  Lymphadenopathy:     Cervical: No cervical adenopathy.  Skin:    General: Skin is warm and dry.     Findings: No erythema or rash.  Neurological:     Mental Status: She is alert and oriented to person, place, and time.  Psychiatric:        Behavior: Behavior normal.     Comments: Well groomed, good eye contact, normal speech and thoughts     CLINICAL DATA:  Productive cough.  EXAM: CHEST - 2 VIEW  COMPARISON:  05/08/2017  FINDINGS: The lungs are clear without focal pneumonia, edema, pneumothorax or pleural effusion. Density at the anterior right first rib is more conspicuous than on the prior study, potentially related to costochondral cartilage calcification. The cardiopericardial silhouette is within normal limits for size. The visualized bony structures of the thorax show no acute abnormality.  IMPRESSION: 1. No acute  cardiopulmonary findings. 2. Density at the anterior right first rib likely related to costochondral cartilage calcification. CT chest without contrast recommended to further evaluate and exclude underlying pulmonary nodule.  These results will be called to the ordering clinician or representative by the Radiologist Assistant, and communication documented in the PACS or [employer].   Electronically Signed   By: Misty Stanley M.D.   On: 10/10/2020 11:47  Results for orders placed or performed in visit on 09/27/20  Cytology - PAP  Result Value Ref Range   High risk HPV Negative    Adequacy      Satisfactory for evaluation; transformation zone component PRESENT.   Diagnosis      - Negative for intraepithelial lesion or malignancy (NILM)   Comment Normal Reference Range HPV - Negative       Assessment & Plan:   Problem List Items Addressed This Visit    Tobacco abuse   Relevant Orders   DG Chest 2 View   Acute non-recurrent sinusitis - Primary   Relevant Medications   azelastine (ASTELIN) 0.1 % nasal spray   levofloxacin (LEVAQUIN) 500 MG tablet   Other Relevant Orders   DG Chest 2 View    Other Visit Diagnoses    Cough       Relevant Orders   DG Chest 2 View      Consistent with acute maxillary sinusitis, likely initially viral URI allergic rhinitis component with worsening concern for bacterial infection.  Prior treatments on Augmentin / Cefdinir, not failures but most recent treatment now another flare  Plan: 1. Start taking Levaquin antibiotic 500mg  daily x 7 days, precaution given on risk of tendon injury 2. Add Azelastine nasal, may continue Flonase 3. Consider return to [facility] for allergy shots or other therapy in future if unsuccessful  CXR STAT today - Reviewed results, called patient after visit 1230pm and reviewed the abnormal finding with cartilage calcification area, question pulmonary nodule, will order Chest CT without contrast to  further evaluate, if pulmonary nodule can refer to [facility] for surveillance. Patient is a smoker.   Meds ordered this encounter  Medications  . azelastine (ASTELIN) 0.1 % nasal spray    Sig: Place 1 spray into both nostrils 2 (two) times daily. Use in each nostril as directed    Dispense:  30 mL    Refill:  5  . levofloxacin (LEVAQUIN) 500 MG tablet    Sig: Take 1  tablet (500 mg total) by mouth daily. For 7 days    Dispense:  7 tablet    Refill:  0      Follow up plan: Return in about 3 weeks (around 10/31/2020) for 3-4 weeks for fasting lab only then 1 week later for [employer].  Future labs ordered for 11/01/20, skip CBC she will get from Hematology in 11/2020  [name], [facility] 10/10/2020, 10:38 AM

## 2020-10-12 ENCOUNTER — Ambulatory Visit
Admission: RE | Admit: 2020-10-12 | Discharge: 2020-10-12 | Disposition: A | Discharge status: Home / Self Care | Payer: Medicare Other | Source: Ambulatory Visit | Attending: Family Medicine | Admitting: Family Medicine

## 2020-10-12 ENCOUNTER — Other Ambulatory Visit: Payer: Self-pay

## 2020-10-12 DIAGNOSIS — R9389 Abnormal findings on diagnostic imaging of other specified body structures: Secondary | ICD-10-CM | POA: Diagnosis not present

## 2020-10-12 DIAGNOSIS — J984 Other disorders of lung: Secondary | ICD-10-CM | POA: Diagnosis not present

## 2020-10-12 DIAGNOSIS — R918 Other nonspecific abnormal finding of lung field: Secondary | ICD-10-CM | POA: Diagnosis not present

## 2020-10-16 DIAGNOSIS — H9209 Otalgia, unspecified ear: Secondary | ICD-10-CM | POA: Diagnosis not present

## 2020-10-16 DIAGNOSIS — Z8249 Family history of ischemic heart disease and other diseases of the circulatory system: Secondary | ICD-10-CM | POA: Diagnosis not present

## 2020-10-16 DIAGNOSIS — Z79899 Other long term (current) drug therapy: Secondary | ICD-10-CM | POA: Diagnosis not present

## 2020-10-16 DIAGNOSIS — K219 Gastro-esophageal reflux disease without esophagitis: Secondary | ICD-10-CM | POA: Diagnosis not present

## 2020-10-16 DIAGNOSIS — E039 Hypothyroidism, unspecified: Secondary | ICD-10-CM | POA: Diagnosis not present

## 2020-10-16 DIAGNOSIS — F17210 Nicotine dependence, cigarettes, uncomplicated: Secondary | ICD-10-CM | POA: Diagnosis not present

## 2020-10-16 DIAGNOSIS — I10 Essential (primary) hypertension: Secondary | ICD-10-CM | POA: Diagnosis not present

## 2020-10-16 DIAGNOSIS — R0981 Nasal congestion: Secondary | ICD-10-CM | POA: Diagnosis not present

## 2020-10-16 DIAGNOSIS — F418 Other specified anxiety disorders: Secondary | ICD-10-CM | POA: Diagnosis not present

## 2020-10-16 DIAGNOSIS — Z825 Family history of asthma and other chronic lower respiratory diseases: Secondary | ICD-10-CM | POA: Diagnosis not present

## 2020-10-16 DIAGNOSIS — J069 Acute upper respiratory infection, unspecified: Secondary | ICD-10-CM | POA: Diagnosis not present

## 2020-10-16 DIAGNOSIS — M47816 Spondylosis without myelopathy or radiculopathy, lumbar region: Secondary | ICD-10-CM | POA: Diagnosis not present

## 2020-10-20 ENCOUNTER — Other Ambulatory Visit: Payer: Self-pay | Admitting: Family Medicine

## 2020-10-20 DIAGNOSIS — E039 Hypothyroidism, unspecified: Secondary | ICD-10-CM

## 2020-10-20 DIAGNOSIS — K219 Gastro-esophageal reflux disease without esophagitis: Secondary | ICD-10-CM

## 2020-10-20 DIAGNOSIS — I10 Essential (primary) hypertension: Secondary | ICD-10-CM

## 2020-10-20 MED ORDER — ATENOLOL 50 MG PO TABS: 50.0000 mg | ORAL_TABLET | Freq: Every day | ORAL | 1 refills | Status: DC

## 2020-10-20 MED ORDER — PANTOPRAZOLE SODIUM 20 MG PO TBEC: DELAYED_RELEASE_TABLET | ORAL | 1 refills | Status: DC

## 2020-10-20 MED ORDER — LEVOTHYROXINE SODIUM 50 MCG PO TABS: 50.0000 ug | ORAL_TABLET | Freq: Every day | ORAL | 1 refills | Status: DC

## 2020-10-21 ENCOUNTER — Encounter: Payer: Self-pay | Admitting: Family Medicine

## 2020-10-21 ENCOUNTER — Telehealth (INDEPENDENT_AMBULATORY_CARE_PROVIDER_SITE_OTHER): Payer: Medicare Other | Admitting: Family Medicine

## 2020-10-21 ENCOUNTER — Ambulatory Visit
Admission: RE | Admit: 2020-10-21 | Discharge: 2020-10-21 | Disposition: A | Discharge status: Home / Self Care | Payer: Medicare Other | Attending: Family Medicine | Admitting: Family Medicine

## 2020-10-21 ENCOUNTER — Ambulatory Visit (INDEPENDENT_AMBULATORY_CARE_PROVIDER_SITE_OTHER): Payer: Medicare Other

## 2020-10-21 ENCOUNTER — Other Ambulatory Visit: Payer: Self-pay

## 2020-10-21 ENCOUNTER — Ambulatory Visit
Admission: RE | Admit: 2020-10-21 | Discharge: 2020-10-21 | Disposition: A | Discharge status: Home / Self Care | Payer: Medicare Other | Source: Ambulatory Visit | Attending: Family Medicine | Admitting: Family Medicine

## 2020-10-21 VITALS — BP 136/78 | Temp 98.6°F

## 2020-10-21 DIAGNOSIS — J40 Bronchitis, not specified as acute or chronic: Secondary | ICD-10-CM | POA: Diagnosis not present

## 2020-10-21 DIAGNOSIS — Z3042 Encounter for surveillance of injectable contraceptive: Secondary | ICD-10-CM

## 2020-10-21 DIAGNOSIS — R059 Cough, unspecified: Secondary | ICD-10-CM | POA: Insufficient documentation

## 2020-10-21 MED ORDER — PROMETHAZINE-DM 6.25-15 MG/5ML PO SYRP: 5.0000 mL | ORAL_SOLUTION | Freq: Four times a day (QID) | ORAL | 0 refills | Status: DC | PRN

## 2020-10-21 MED ORDER — PREDNISONE 10 MG PO TABS: ORAL_TABLET | ORAL | 0 refills | Status: AC

## 2020-10-21 MED ORDER — MEDROXYPROGESTERONE ACETATE 150 MG/ML IM SUSP
150.0000 mg | Freq: Once | INTRAMUSCULAR | Status: AC
Start: 2020-10-21 — End: 2020-10-21
  Administered 2020-10-21: 150 mg via INTRAMUSCULAR

## 2020-10-21 NOTE — Assessment & Plan Note (Signed)
See bronchitis A/P

## 2020-10-21 NOTE — Patient Instructions (Signed)
I have sent in a few prescriptions to your pharmacy on file.  Begin prednisone 30mg  daily x 3 days, then 20mg  daily x 3 days, then 10mg  daily x 3 days.  Begin promethazine DM 23mL every 6 hours as needed for cough.  Be sure not to drive or operate heavy machinery as this medication can cause sedation.  Continue to use your albuterol inhaler 1-2 puffs every 4-6 hours as needed for shortness of breath, cough, and/or wheezing  Have your chest xray taken and we will contact you with the results  If you begin to have worsening shortness of breath, chest pain, fever over 104 that is not responsive to ibuprofen and/or acetaminophen, or impending sense of doom to [place]!  We will plan to see you back if your symptoms worsen or fail to improve  You will receive a survey after today's visit either digitally by e-mail or paper by USPS mail. Your experiences and feedback matter to [place].  Please respond so we know how we are doing as we provide care for you.  Call us with any questions/concerns/needs.  It is my goal to be available to you for your health concerns.  Thanks for choosing me to be a partner in your healthcare needs!  Harlin Rain, FNP-C Family Nurse Practitioner [facility] Phone: [phone]

## 2020-10-21 NOTE — Progress Notes (Signed)
Virtual Visit via Telephone  The purpose of this virtual visit is to provide medical care while limiting exposure to the novel coronavirus (COVID19) for both patient and office staff.  Consent was obtained for phone visit:  Yes.   Answered questions that patient had about telehealth interaction:  Yes.   I discussed the limitations, risks, security and privacy concerns of performing an evaluation and management service by telephone. I also discussed with the patient that there may be a patient responsible charge related to this service. The patient expressed understanding and agreed to proceed.  Patient is at home and is accessed via telephone Services are provided by Harlin Rain, FNP-C from [facility])  ---------------------------------------------------------------------- Chief Complaint  Patient presents with  . Cough    persistent cough. pt was seen at the ER on Sunday and prescribed Tessalon Pearles w/ no improvement, SOB w/ exertion x 2 weeks     S: Reviewed CMA documentation. I have called patient and gathered additional HPI as follows:  Amanda Webb presents for virtual visit via telephone.  She has concerns for persistent cough, was seen with [facility] ER on 10/16/2020 and provided with a prescription for tessalon perles, without improvement in her symptoms.  Reports some shortness of breath with exertion x 2 weeks.  Symptoms started approximately 10/09/2020 and was treated for a sinus infection without improvement in symptoms.  Reports increased coughing where she has ended up having urinary incontinence.  Denies fevers, sore throat, change in taste/smell, headache, sinus pressure/pain, CP, body aches, chills/shakes, abdominal pain, n/v/d.  Patient is currently home Denies any high risk travel to areas of current concern for COVID19. Denies any known or suspected exposure to person with or possibly with COVID19.  Past Medical History:   Diagnosis Date  . Allergy   . Anemia   . Anxiety   . Arrhythmia   . Arthritis   . Cervical dysplasia   . Cystitis   . Depression   . GERD (gastroesophageal reflux disease)   . Gross hematuria   . Headache   . Heart murmur    asymptomatic  . History of methicillin resistant staphylococcus aureus (MRSA) 2013  . HLD (hyperlipidemia)   . Hypertension   . Hypothyroid   . Lymphedema   . Palpitations   . Pernicious anemia   . Tobacco abuse    Social History   Tobacco Use  . Smoking status: Current Every Day Smoker    Packs/day: 0.50    Years: 30.00    Pack years: 15.00    Types: Cigarettes  . Smokeless tobacco: Never Used  Vaping Use  . Vaping Use: Never used  Substance Use Topics  . Alcohol use: Yes    Comment: wine occ  . Drug use: No    Current Outpatient Medications:  .  Accu-Chek FastClix Lancets MISC, USE TO CHECK SUGAR UP TO TWICE DAILY AS DIRECTED, Disp: 102 each, Rfl: 5 .  ACCU-CHEK GUIDE test strip, CHECK BLOOD SUGAR UP TO TWICE DAILY AS DIRECTED, Disp: 100 strip, Rfl: 5 .  acetaminophen (TYLENOL) 500 MG tablet, Take 1,000 mg by mouth every 6 (six) hours as needed for moderate pain or headache., Disp: , Rfl:  .  albuterol (VENTOLIN HFA) 108 (90 Base) MCG/ACT inhaler, INHALE 1 TO 2 PUFFS BY MOUTH INTO LUNGS EVERY 4 TO 6 HOURS AS NEEDED FOR SHORTNESS OF BREATH, Disp: 18 g, Rfl: 2 .  ARIPiprazole (ABILIFY) 2 MG tablet, Take 2 mg by mouth  every morning. , Disp: , Rfl:  .  atenolol (TENORMIN) 50 MG tablet, Take 1 tablet (50 mg total) by mouth daily., Disp: 90 tablet, Rfl: 1 .  azelastine (ASTELIN) 0.1 % nasal spray, Place 1 spray into both nostrils 2 (two) times daily. Use in each nostril as directed, Disp: 30 mL, Rfl: 5 .  Cetirizine HCl (ZYRTEC ALLERGY) 10 MG CAPS, Take 10 mg by mouth daily with lunch. , Disp: , Rfl:  .  Cyanocobalamin (B-12 COMPLIANCE INJECTION IJ), Inject 1 Dose as directed every 30 (thirty) days., Disp: , Rfl:  .  fluticasone (FLONASE) 50  MCG/ACT nasal spray, SHAKE LIQUID AND USE 2 SPRAYS IN EACH NOSTRIL EVERY DAY (Patient taking differently: Place 2 sprays into both nostrils daily. ), Disp: 48 g, Rfl: 1 .  fluvoxaMINE (LUVOX) 100 MG tablet, Take 150 mg by mouth every morning. , Disp: , Rfl:  .  gabapentin (NEURONTIN) 100 MG capsule, Take 200 mg by mouth 3 (three) times daily. , Disp: , Rfl:  .  ketoconazole (NIZORAL) 2 % shampoo, APPLY EXTERNALLY 2 TIMES A WEEK, Disp: 120 mL, Rfl: 2 .  lamoTRIgine (LAMICTAL) 200 MG tablet, Take 200 mg by mouth every morning. , Disp: , Rfl:  .  levothyroxine (SYNTHROID) 50 MCG tablet, Take 1 tablet (50 mcg total) by mouth daily before breakfast., Disp: 90 tablet, Rfl: 1 .  medroxyPROGESTERone (DEPO-PROVERA) 150 MG/ML injection, ADMINISTER 1 ML(150 MG) IN THE MUSCLE EVERY 3 MONTHS (Patient taking differently: Inject 150 mg into the muscle every 3 (three) months. ), Disp: 1 mL, Rfl: 1 .  Multiple Vitamin (MULTIVITAMIN WITH MINERALS) TABS tablet, Take 1 tablet by mouth daily., Disp: , Rfl:  .  pantoprazole (PROTONIX) 20 MG tablet, TAKE 1 TABLET(20 MG) BY MOUTH DAILY, Disp: 90 tablet, Rfl: 1 .  tretinoin (RETIN-A) 0.025 % cream, APPLY EXTERNALLY TO THE AFFECTED AREA AT BEDTIME AS NEEDED (Patient taking differently: Apply 1 application topically at bedtime as needed (acne). ), Disp: 45 g, Rfl: 2 .  levofloxacin (LEVAQUIN) 500 MG tablet, Take 1 tablet (500 mg total) by mouth daily. For 7 days (Patient not taking: Reported on 10/21/2020), Disp: 7 tablet, Rfl: 0 .  predniSONE (DELTASONE) 10 MG tablet, Take 3 tablets (30 mg total) by mouth daily with breakfast for 3 days, THEN 2 tablets (20 mg total) daily with breakfast for 3 days, THEN 1 tablet (10 mg total) daily with breakfast for 3 days., Disp: 18 tablet, Rfl: 0 .  promethazine-dextromethorphan (PROMETHAZINE-DM) 6.25-15 MG/5ML syrup, Take 5 mLs by mouth 4 (four) times daily as needed for cough., Disp: 118 mL, Rfl: 0  Depression screen [facility] 2/9 12/30/2019  07/07/2019 01/01/2019  Decreased Interest 0 1 0  Down, Depressed, Hopeless 0 1 0  PHQ - 2 Score 0 2 0  Altered sleeping - 3 3  Tired, decreased energy - 3 0  Change in appetite - 0 0  Feeling bad or failure about yourself  - 3 0  Trouble concentrating - 0 0  Moving slowly or fidgety/restless - 0 0  Suicidal thoughts - 0 0  PHQ-9 Score - 11 3  Difficult doing work/chores - Somewhat difficult Not difficult at all  Some recent data might be hidden    GAD 7 : Generalized Anxiety Score 01/01/2019  Nervous, Anxious, on Edge 3  Control/stop worrying 3  Worry too much - different things 3  Trouble relaxing 3  Restless 3  Easily annoyed or irritable 3  Afraid - awful might  happen 3  Total GAD 7 Score 21  Anxiety Difficulty Somewhat difficult    -------------------------------------------------------------------------- O: No physical exam performed due to remote telephone encounter.  Physical Exam: Patient remotely monitored without video.  Verbal communication appropriate.  Cognition normal.  Recent Results (from the past 2160 hour(s))  Cytology - PAP     Status: None   Collection Time: 09/27/20  2:39 PM  Result Value Ref Range   High risk HPV Negative    Adequacy      Satisfactory for evaluation; transformation zone component PRESENT.   Diagnosis      - Negative for intraepithelial lesion or malignancy (NILM)   Comment Normal Reference Range HPV - Negative     -------------------------------------------------------------------------- A&P:  Problem List Items Addressed This Visit      Respiratory   Bronchitis - Primary    Bronchitis with continued cough.  Has been treated with levaquin and discussed is not likely a bacterial infection.  Will treat with prednisone taper over the next 9 days, night time cough medicine (promethazine DM), and continued use of albuterol inhaler as needed.  Will order CXR, discussed may not return until Monday, and if symptoms worsen, to proceed to  UC or ER for quicker evaluation.  Patient verbalized understanding.  Plan: 1. Begin prednisone 30mg  daily x 3 days, then 20mg  daily x 3 days, then 10mg  daily x 3 days 2. Begin promethazine DM 74mL every 6 hours as needed for cough.  Discussed to avoid driving or operating heavy machinery while using this medication as it can be sedating 3. Have CXR completed 4. Strict ER precautions 5. RTC PRN      Relevant Medications   predniSONE (DELTASONE) 10 MG tablet   promethazine-dextromethorphan (PROMETHAZINE-DM) 6.25-15 MG/5ML syrup     Other   Cough    See bronchitis A/P      Relevant Medications   predniSONE (DELTASONE) 10 MG tablet   promethazine-dextromethorphan (PROMETHAZINE-DM) 6.25-15 MG/5ML syrup   Other Relevant Orders   DG Chest 2 View      Meds ordered this encounter  Medications  . predniSONE (DELTASONE) 10 MG tablet    Sig: Take 3 tablets (30 mg total) by mouth daily with breakfast for 3 days, THEN 2 tablets (20 mg total) daily with breakfast for 3 days, THEN 1 tablet (10 mg total) daily with breakfast for 3 days.    Dispense:  18 tablet    Refill:  0  . promethazine-dextromethorphan (PROMETHAZINE-DM) 6.25-15 MG/5ML syrup    Sig: Take 5 mLs by mouth 4 (four) times daily as needed for cough.    Dispense:  118 mL    Refill:  0    Follow-up: - Return if symptoms worsen or fail to improve  Patient verbalizes understanding with the above medical recommendations including the limitation of remote medical advice.  Specific follow-up and call-back criteria were given for patient to follow-up or seek medical care more urgently if needed.  - Time spent in direct consultation with patient on phone: 8 minutes  Harlin Rain, [facility] 10/21/2020, 11:57 AM

## 2020-10-21 NOTE — Progress Notes (Signed)
Date last pap: 09/27/2020. Last Depo-Provera: 08/05/2020 Side Effects if any: none Serum HCG indicated? n/a Depo-Provera 150 mg IM given by: Abby Potash, LPN Next appointment due January 21-January 20, 2021.

## 2020-10-21 NOTE — Assessment & Plan Note (Signed)
Bronchitis with continued cough.  Has been treated with levaquin and discussed is not likely a bacterial infection.  Will treat with prednisone taper over the next 9 days, night time cough medicine (promethazine DM), and continued use of albuterol inhaler as needed.  Will order CXR, discussed may not return until Monday, and if symptoms worsen, to proceed to UC or ER for quicker evaluation.  Patient verbalized understanding.  Plan: 1. Begin prednisone 30mg  daily x 3 days, then 20mg  daily x 3 days, then 10mg  daily x 3 days 2. Begin promethazine DM 76mL every 6 hours as needed for cough.  Discussed to avoid driving or operating heavy machinery while using this medication as it can be sedating 3. Have CXR completed 4. Strict ER precautions 5. RTC PRN

## 2020-10-24 ENCOUNTER — Telehealth: Payer: Self-pay | Admitting: Internal Medicine

## 2020-10-24 ENCOUNTER — Ambulatory Visit (INDEPENDENT_AMBULATORY_CARE_PROVIDER_SITE_OTHER): Payer: Medicare Other | Admitting: Family Medicine

## 2020-10-24 ENCOUNTER — Encounter: Payer: Self-pay | Admitting: Family Medicine

## 2020-10-24 ENCOUNTER — Other Ambulatory Visit: Payer: Self-pay

## 2020-10-24 VITALS — BP 143/73 | HR 78 | Temp 99.4°F | Ht 67.0 in | Wt 272.0 lb

## 2020-10-24 DIAGNOSIS — R059 Cough, unspecified: Secondary | ICD-10-CM | POA: Diagnosis not present

## 2020-10-24 DIAGNOSIS — J40 Bronchitis, not specified as acute or chronic: Secondary | ICD-10-CM

## 2020-10-24 MED ORDER — PROMETHAZINE-DM 6.25-15 MG/5ML PO SYRP: 5.0000 mL | ORAL_SOLUTION | Freq: Four times a day (QID) | ORAL | 0 refills | Status: DC | PRN

## 2020-10-24 NOTE — Assessment & Plan Note (Signed)
Continued bronchitis with treatment failure with levaquin, albuterol, prednisone, tessalon perles and promethazine DM.  Has been to pulmonology in the past.  CXR (11/05) negative.  Chest CT 10/12/2020 WNL - will put in urgent referral to pulmonary for evaluation in clinic.  Patient in agreement with treatment plan.  Strict ER precautions.

## 2020-10-24 NOTE — Assessment & Plan Note (Signed)
See bronchitis A/P

## 2020-10-24 NOTE — Progress Notes (Signed)
Subjective:    Patient ID: Amanda Webb, female    DOB: [birth year], 49 y.o.   MRN: [id number]  Amanda Webb is a 49 y.o. female presenting on 10/24/2020 for Cough (X2-3 weeks, coughing up yellow mucous, low grade fever, cant catch breath when coughing and laying down, tested negative for covid last week)   HPI  Ms. Borneman presents to clinic with concerns for cough x 2-3 weeks with yellow mucus, low grade fever, difficulty catching breath when coughing or laying down.  Cough is not positional, coughing when sitting up.  Negative COVID testing.  Has been on levaquin, albuterol inhaler, prednisone, tessalon perles and promethazine DM without improvement in symptoms.  Denies fevers, sore throat, change in taste/smell, CP, palpitations, abdominal pain, DOE, n/v/d, leg swelling.  Depression screen [facility] 2/9 10/24/2020 12/30/2019 07/07/2019  Decreased Interest 0 0 1  Down, Depressed, Hopeless 0 0 1  PHQ - 2 Score 0 0 2  Altered sleeping 0 - 3  Tired, decreased energy 0 - 3  Change in appetite 0 - 0  Feeling bad or failure about yourself  0 - 3  Trouble concentrating 0 - 0  Moving slowly or fidgety/restless 0 - 0  Suicidal thoughts 0 - 0  PHQ-9 Score 0 - 11  Difficult doing work/chores Not difficult at all - Somewhat difficult  Some recent data might be hidden    Social History   Tobacco Use  . Smoking status: Current Every Day Smoker    Packs/day: 0.50    Years: 30.00    Pack years: 15.00    Types: Cigarettes  . Smokeless tobacco: Never Used  Vaping Use  . Vaping Use: Never used  Substance Use Topics  . Alcohol use: Yes    Comment: wine occ  . Drug use: No    Review of Systems  Constitutional: Negative.   HENT: Negative.   Eyes: Negative.   Respiratory: Positive for cough, shortness of breath and wheezing. Negative for apnea, choking, chest tightness and stridor.   Cardiovascular: Negative.   Gastrointestinal: Negative.   Endocrine: Negative.   Genitourinary:  Negative.   Musculoskeletal: Negative.   Skin: Negative.   Allergic/Immunologic: Negative.   Neurological: Negative.   Hematological: Negative.   Psychiatric/Behavioral: Negative.    Per HPI unless specifically indicated above     Objective:    BP (!) 143/73   Pulse 78   Temp 99.4 F (37.4 C) (Oral)   Ht 5\' 7"  (1.702 m)   Wt 272 lb (123.4 kg)   LMP  (LMP Unknown)   SpO2 95%   BMI 42.60 kg/m   Wt Readings from Last 3 Encounters:  10/24/20 272 lb (123.4 kg)  10/10/20 270 lb (122.5 kg)  10/06/20 270 lb 3 oz (122.6 kg)    Physical Exam Vitals and nursing note reviewed.  Constitutional:      General: She is not in acute distress.    Appearance: Normal appearance. She is well-developed and well-groomed. She is morbidly obese. She is not ill-appearing or toxic-appearing.  HENT:     Head: Normocephalic and atraumatic.     Nose:     Comments: Lizbeth Bark is in place, covering mouth and nose. Eyes:     General: Lids are normal. Vision grossly intact.        Right eye: No discharge.        Left eye: No discharge.     Extraocular Movements: Extraocular movements intact.     Conjunctiva/sclera: Conjunctivae  normal.     Pupils: Pupils are equal, round, and reactive to light.  Cardiovascular:     Rate and Rhythm: Normal rate and regular rhythm.     Pulses: Normal pulses.     Heart sounds: Normal heart sounds. No murmur heard.  No friction rub. No gallop.   Pulmonary:     Effort: Pulmonary effort is normal. No respiratory distress.     Breath sounds: Examination of the right-lower field reveals wheezing. Wheezing present. No rhonchi or rales.  Musculoskeletal:     Right lower leg: No edema.     Left lower leg: No edema.  Skin:    General: Skin is warm and dry.     Capillary Refill: Capillary refill takes less than 2 seconds.  Neurological:     General: No focal deficit present.     Mental Status: She is alert and oriented to person, place, and time.  Psychiatric:         Attention and Perception: Attention and perception normal.        Mood and Affect: Mood and affect normal.        Speech: Speech normal.        Behavior: Behavior normal. Behavior is cooperative.        Thought Content: Thought content normal.        Cognition and Memory: Cognition and memory normal.        Judgment: Judgment normal.    Results for orders placed or performed in visit on 09/27/20  Cytology - PAP  Result Value Ref Range   High risk HPV Negative    Adequacy      Satisfactory for evaluation; transformation zone component PRESENT.   Diagnosis      - Negative for intraepithelial lesion or malignancy (NILM)   Comment Normal Reference Range HPV - Negative       Assessment & Plan:   Problem List Items Addressed This Visit      Respiratory   Bronchitis - Primary    Continued bronchitis with treatment failure with levaquin, albuterol, prednisone, tessalon perles and promethazine DM.  Has been to pulmonology in the past.  CXR (11/05) negative.  Chest CT 10/12/2020 WNL - will put in urgent referral to pulmonary for evaluation in clinic.  Patient in agreement with treatment plan.  Strict ER precautions.      Relevant Medications   promethazine-dextromethorphan (PROMETHAZINE-DM) 6.25-15 MG/5ML syrup   Other Relevant Orders   Ambulatory referral to Pulmonology     Other   Cough    See bronchitis A/P      Relevant Medications   promethazine-dextromethorphan (PROMETHAZINE-DM) 6.25-15 MG/5ML syrup      Meds ordered this encounter  Medications  . promethazine-dextromethorphan (PROMETHAZINE-DM) 6.25-15 MG/5ML syrup    Sig: Take 5 mLs by mouth 4 (four) times daily as needed for cough.    Dispense:  118 mL    Refill:  0    Follow up plan: Return After visit with pulmonary.   Harlin Rain, [place] [facility] Nurse Practitioner [facility] 10/24/2020, 3:54 PM

## 2020-10-24 NOTE — Patient Instructions (Signed)
An urgent referral to Pulmonology has been placed today.  If you have not heard from the specialty office or our referral coordinator within 1 day, please let us know and we will follow up with the referral coordinator for an update.  I have sent in a refill on your promethazine DM to take 45mL every 4-6 hours as needed for cough  If you have any worsening of symptoms, to proceed to the emergency room for evaluation  We will plan to see you back after your appointment with pulmonary  You will receive a survey after today's visit either digitally by e-mail or paper by [place] mail. Your experiences and feedback matter to [place].  Please respond so we know how we are doing as we provide care for you.  Call us with any questions/concerns/needs.  It is my goal to be available to you for your health concerns.  Thanks for choosing me to be a partner in your healthcare needs!  Harlin Rain, FNP-C Family Nurse Practitioner [facility] Phone: [phone]

## 2020-10-24 NOTE — Telephone Encounter (Signed)
Consult scheduled 10/25/20 at 9:30am w/ Dr. Patsey Berthold per Lesleigh Noe. Nothing further needed.

## 2020-10-25 ENCOUNTER — Encounter: Payer: Self-pay | Admitting: Pulmonary Disease

## 2020-10-25 ENCOUNTER — Ambulatory Visit (INDEPENDENT_AMBULATORY_CARE_PROVIDER_SITE_OTHER): Payer: Medicare Other | Admitting: Pulmonary Disease

## 2020-10-25 ENCOUNTER — Other Ambulatory Visit
Admission: RE | Admit: 2020-10-25 | Discharge: 2020-10-25 | Disposition: A | Discharge status: Home / Self Care | Payer: Medicare Other | Source: Ambulatory Visit | Attending: Pulmonary Disease | Admitting: Pulmonary Disease

## 2020-10-25 VITALS — BP 112/72 | HR 72 | Temp 97.3°F | Ht 67.0 in | Wt 273.6 lb

## 2020-10-25 DIAGNOSIS — F17210 Nicotine dependence, cigarettes, uncomplicated: Secondary | ICD-10-CM | POA: Diagnosis not present

## 2020-10-25 DIAGNOSIS — R062 Wheezing: Secondary | ICD-10-CM

## 2020-10-25 DIAGNOSIS — R059 Cough, unspecified: Secondary | ICD-10-CM

## 2020-10-25 DIAGNOSIS — J449 Chronic obstructive pulmonary disease, unspecified: Secondary | ICD-10-CM | POA: Diagnosis not present

## 2020-10-25 MED ORDER — BREZTRI AEROSPHERE 160-9-4.8 MCG/ACT IN AERO: 2.0000 | INHALATION_SPRAY | Freq: Two times a day (BID) | RESPIRATORY_TRACT | 0 refills | Status: DC

## 2020-10-25 MED ORDER — CLARITHROMYCIN 500 MG PO TABS: 500.0000 mg | ORAL_TABLET | Freq: Two times a day (BID) | ORAL | 0 refills | Status: AC

## 2020-10-25 MED ORDER — BENZONATATE 200 MG PO CAPS: 200.0000 mg | ORAL_CAPSULE | Freq: Three times a day (TID) | ORAL | 0 refills | Status: DC | PRN

## 2020-10-25 NOTE — Patient Instructions (Signed)
We are getting a blood test to check for allergies  You have gotten a prescription from antibiotic that also acts as an anti-inflammatory, this is Biaxin 1 tablet twice a day till it is all gone  Have given you a trial of Breztri 2 puffs twice a day.  Make sure you rinse your mouth well after you use it  We will see you in follow-up in 3 to 4 weeks time with either me or the APP

## 2020-10-25 NOTE — Progress Notes (Signed)
Subjective:    Patient ID: Amanda Webb, female    DOB: [birth year], 49 y.o.   MRN: [id number]  HPI 49 year old current smoker (1 PPD) who presents for evaluation of cough which started  2 weeks ago. She is referred by [facility]. The patient states that cough started approximately 2 weeks ago give or take. It is worse at nighttime when she is reclining. She states that she has been treated with antibiotics, prednisone and as needed inhaler without any help. She does not note anything that helps her at all. Prednisone only gave her side effects of anxiety and did not really help her cough. She lives in the same home as her mother, mother has had a cat for a number of years they did get a new cat 1-1/2 months ago. She has never been sensitive to cats previously. She had fevers chills and sweats and has been tested for COVID-19 and has been negative. She did note that symptoms started with a sinus infection. She notes that occasionally she will expectorate some yellowish sputum. Initially sputum was yellow to brown now is yellow to whitish. No hemoptysis. She has noted some urinary incontinence with the cough. Tessalon Perles helped to some degree. Being supine worsens the symptoms. She has had no orthopnea or paroxysmal nocturnal dyspnea, no chest pain, no lower extremity edema. She noted palpitations when she was taking prednisone. She voices no other complaint. She has had no gastroesophageal reflux symptoms, she is on Protonix.  She has had a chest x-rays on October 25 and on 5 November that showed no acute disease, she had a CT scan of the chest on the 27th of that showed no worrisome lesions or infiltrates. These have been independently reviewed as well.   Review of Systems A 10 point review of systems was performed and it is as noted above otherwise negative.  Past Medical History:  Diagnosis Date  . Allergy   . Anemia   . Anxiety   . Arrhythmia   . Arthritis   .  Cervical dysplasia   . Cystitis   . Depression   . GERD (gastroesophageal reflux disease)   . Gross hematuria   . Headache   . Heart murmur    asymptomatic  . History of methicillin resistant staphylococcus aureus (MRSA) 2013  . HLD (hyperlipidemia)   . Hypertension   . Hypothyroid   . Lymphedema   . Palpitations   . Pernicious anemia   . Tobacco abuse    Past Surgical History:  Procedure Laterality Date  . CARPAL TUNNEL RELEASE Bilateral 2011  . FOOT SURGERY Right 2013   Plantar fascia  . ROBOTIC ASSISTED LAPAROSCOPIC CHOLECYSTECTOMY  07/15/2020   Dr Christian Mate  . TONSILLECTOMY  1979   with ear tubes   Family History  Problem Relation Age of Onset  . Depression Mother   . Mental illness Mother   . Alcohol abuse Mother   . Lung cancer Mother   . Cancer Maternal Grandmother   . Diabetes Maternal Grandmother   . Ovarian cancer Maternal Grandmother   . Kidney disease Maternal Grandfather   . Diabetes Father   . Mental illness Father   . Depression Father   . Drug abuse Father   . Depression Paternal Grandfather   . Drug abuse Paternal Grandfather   . Bladder Cancer Neg Hx   . Breast cancer Neg Hx    Social History   Tobacco Use  . Smoking status: Current Every  Day Smoker    Packs/day: 1.50    Years: 32.00    Pack years: 48.00    Types: Cigarettes  . Smokeless tobacco: Never Used  . Tobacco comment: 1 ppd/ 10/25/20  Substance Use Topics  . Alcohol use: Yes    Comment: wine occ   Allergies  Allergen Reactions  . Bee Venom Anaphylaxis, Hives and Swelling    Carries Epi pen.   . Cat Hair Extract Itching, Other (See Comments) and Swelling    Allergic to trees, nuts, wheat, grass, cats & dogs - itchy watery eyes, swelling. Uses Zyrtec & Flonase & Benadryl if really bad. Used to get allergy shots. Allergic to trees, nuts, wheat, grass, cats & dogs - itchy watery eyes, swelling. Uses Zyrtec & Flonase & Benadryl if really bad. Used to get allergy shots.  .  Tetracyclines & Related Nausea And Vomiting  . Lac Bovis Diarrhea and Nausea And Vomiting  . Lactose Diarrhea and Nausea And Vomiting  . Milk Protein Diarrhea and Nausea And Vomiting  . Tape Other (See Comments) and Rash    Needs to use paper tape. Breaks out with severe rash, pulls skin off when using adhesive.   Current Meds  Medication Sig  . Accu-Chek FastClix Lancets MISC USE TO CHECK SUGAR UP TO TWICE DAILY AS DIRECTED  . ACCU-CHEK GUIDE test strip CHECK BLOOD SUGAR UP TO TWICE DAILY AS DIRECTED  . acetaminophen (TYLENOL) 500 MG tablet Take 1,000 mg by mouth every 6 (six) hours as needed for moderate pain or headache.  . albuterol (VENTOLIN HFA) 108 (90 Base) MCG/ACT inhaler INHALE 1 TO 2 PUFFS BY MOUTH INTO LUNGS EVERY 4 TO 6 HOURS AS NEEDED FOR SHORTNESS OF BREATH  . ARIPiprazole (ABILIFY) 2 MG tablet Take 2 mg by mouth every morning.   Marland Kitchen atenolol (TENORMIN) 50 MG tablet Take 1 tablet (50 mg total) by mouth daily.  Marland Kitchen azelastine (ASTELIN) 0.1 % nasal spray Place 1 spray into both nostrils 2 (two) times daily. Use in each nostril as directed  . benzonatate (TESSALON) 100 MG capsule Take 100 mg by mouth 3 (three) times daily.  . Cetirizine HCl (ZYRTEC ALLERGY) 10 MG CAPS Take 10 mg by mouth daily with lunch.   . Cyanocobalamin (B-12 COMPLIANCE INJECTION IJ) Inject 1 Dose as directed every 30 (thirty) days.  . fluticasone (FLONASE) 50 MCG/ACT nasal spray SHAKE LIQUID AND USE 2 SPRAYS IN EACH NOSTRIL EVERY DAY (Patient taking differently: Place 2 sprays into both nostrils daily. )  . fluvoxaMINE (LUVOX) 100 MG tablet Take 150 mg by mouth every morning.   . gabapentin (NEURONTIN) 100 MG capsule Take 200 mg by mouth 3 (three) times daily.   Marland Kitchen ketoconazole (NIZORAL) 2 % shampoo APPLY EXTERNALLY 2 TIMES A WEEK  . lamoTRIgine (LAMICTAL) 200 MG tablet Take 200 mg by mouth every morning.   Marland Kitchen levothyroxine (SYNTHROID) 50 MCG tablet Take 1 tablet (50 mcg total) by mouth daily before breakfast.  .  medroxyPROGESTERone (DEPO-PROVERA) 150 MG/ML injection ADMINISTER 1 ML(150 MG) IN THE MUSCLE EVERY 3 MONTHS (Patient taking differently: Inject 150 mg into the muscle every 3 (three) months. )  . Multiple Vitamin (MULTIVITAMIN WITH MINERALS) TABS tablet Take 1 tablet by mouth daily.  . pantoprazole (PROTONIX) 20 MG tablet TAKE 1 TABLET(20 MG) BY MOUTH DAILY  . [EXPIRED] predniSONE (DELTASONE) 10 MG tablet Take 3 tablets (30 mg total) by mouth daily with breakfast for 3 days, THEN 2 tablets (20 mg total) daily with breakfast  for 3 days, THEN 1 tablet (10 mg total) daily with breakfast for 3 days.  . promethazine-dextromethorphan (PROMETHAZINE-DM) 6.25-15 MG/5ML syrup Take 5 mLs by mouth 4 (four) times daily as needed for cough.  . tretinoin (RETIN-A) 0.025 % cream APPLY EXTERNALLY TO THE AFFECTED AREA AT BEDTIME AS NEEDED (Patient taking differently: Apply 1 application topically at bedtime as needed (acne). )   Immunization History  Administered Date(s) Administered  . Influenza, Seasonal, Injecte, Preservative Fre 10/21/2007, 09/01/2011, 10/07/2012  . Influenza,inj,Quad PF,6+ Mos 09/17/2013, 09/26/2016, 10/03/2017, 09/19/2018, 09/11/2019, 09/07/2020  . Influenza-Unspecified 08/18/2014, 09/10/2019  . PFIZER SARS-COV-2 Vaccination 02/16/2020, 03/18/2020  . Pneumococcal Polysaccharide-23 10/03/2017  . Tdap 10/21/2007        Objective:   Physical Exam BP 112/72 (BP Location: Left Wrist, Patient Position: Sitting, Cuff Size: Normal)   Pulse 72   Temp (!) 97.3 F (36.3 C) (Temporal)   Ht 5\' 7"  (1.702 m)   Wt 273 lb 9.6 oz (124.1 kg)   LMP  (LMP Unknown)   SpO2 96%   BMI 42.85 kg/m  GENERAL: Obese woman, fully ambulatory, no acute distress. No conversational dyspnea. HEAD: Normocephalic, atraumatic.  EYES: Pupils equal, round, reactive to light.  No scleral icterus.  MOUTH: Nose/mouth/throat not examined due to masking requirements for COVID 19. NECK: Supple. No thyromegaly. Trachea  midline. No JVD.  No adenopathy. PULMONARY: Good air entry bilaterally. Scattered end expiratory wheezes, few rhonchi CARDIOVASCULAR: S1 and S2. Regular rate and rhythm. No rubs, murmurs or gallops heard. ABDOMEN: Obese otherwise benign MUSCULOSKELETAL: No joint deformity, no clubbing, no edema.  NEUROLOGIC: No focal deficit, no gait disturbance, speech is fluent. SKIN: Intact,warm,dry. On limited exam, no rashes. PSYCH: Mood and behavior normal.  Chest x-ray from 21 October 2020, independently reviewed:      Assessment & Plan:     ICD-10-CM   1. Cough  R05.9 Allergen Panel (27) + IGE   Likely due to bronchospasm Cannot exclude atypical infection/bronchitis Biaxin  2. COPD suggested by initial evaluation ([place]) - poorly compensated  J44.9    Suspect asthma/COPD overlap syndrome Poorly compensated as noted by ongoing wheezing PFTs once better compensated Trial of Breztri  3. Wheezing  R06.2 Allergen Panel (27) + IGE   Suspect asthma/COPD overlap syndrome Poorly compensated Allergen panel/IgE See above Trial of Breztri 2 puffs twice a day  4. Tobacco dependence due to cigarettes  F17.210    Patient counseled regards to discontinuation of smoking Follow counseling time 3 to 5 minutes    Orders Placed This Encounter  Procedures  . Allergen Panel (27) + IGE    Standing Status:   Future    Number of Occurrences:   1    Standing Expiration Date:   10/25/2021   Meds ordered this encounter  Medications  . clarithromycin (BIAXIN) 500 MG tablet    Sig: Take 1 tablet (500 mg total) by mouth 2 (two) times daily for 7 days.    Dispense:  14 tablet    Refill:  0  . Budeson-Glycopyrrol-Formoterol (BREZTRI AEROSPHERE) 160-9-4.8 MCG/ACT AERO    Sig: Inhale 2 puffs into the lungs in the morning and at bedtime.    Dispense:  5.9 g    Refill:  0    Order Specific Question:   Lot Number?    Answer:   [id number]    Order Specific Question:   Expiration Date?    Answer:   02/14/2022     Order Specific Question:   Quantity  Answer:   2  . DISCONTD: benzonatate (TESSALON) 200 MG capsule    Sig: Take 1 capsule (200 mg total) by mouth 3 (three) times daily as needed for cough.    Dispense:  30 capsule    Refill:  0   Smoking cessation instruction/counseling given:  counseled patient on the dangers of tobacco use, advised patient to stop smoking, and reviewed strategies to maximize success  Discussion:  Patient has had a cough of 2 weeks duration. It appears that it started with apparent sinus infection. Now may be aggravated by allergies. Suspect that she has asthma/COPD overlap syndrome. First and foremost she needs to quit smoking this was discussed with the patient in detail. She has potential allergen exacerbators in the home will obtain allergy panel, initiate Breztri trial. Patient also be treated with Biaxin for potential atypical organism infection. We will see the patient in follow-up in 3 to 4 weeks time if she is better compensated at that time we will proceed with ordering PFTs. She is to contact us prior to that time should any new difficulties arise.   Renold Don, MD [facility]   *This note was dictated using voice recognition software/Dragon.  Despite best efforts to proofread, errors can occur which can change the meaning.  Any change was purely unintentional.

## 2020-10-26 NOTE — Telephone Encounter (Signed)
It would be best for her to hold off on the booster for now.

## 2020-10-26 NOTE — Telephone Encounter (Signed)
Dr. Gonzalez, please advise. Thanks 

## 2020-10-27 DIAGNOSIS — F4310 Post-traumatic stress disorder, unspecified: Secondary | ICD-10-CM | POA: Diagnosis not present

## 2020-10-27 LAB — ALLERGEN PANEL (27) + IGE
Alternaria Alternata IgE: <0.1 kU/L
Aspergillus Fumigatus IgE: <0.1 kU/L
Bahia Grass IgE: <0.1 kU/L
Bermuda Grass IgE: <0.1 kU/L
Cat Dander IgE: 0.18 kU/L — AB
Cedar, Mountain IgE: 0.18 kU/L — AB
Cladosporium Herbarum IgE: <0.1 kU/L
Cocklebur IgE: 0.3 kU/L — AB
Cockroach, American IgE: 0.42 kU/L — AB
Common Silver Birch IgE: <0.1 kU/L
D Farinae IgE: 2.22 kU/L — AB
D Pteronyssinus IgE: 2.8 kU/L — AB
Dog Dander IgE: 3.14 kU/L — AB
Elm, American IgE: <0.1 kU/L
Hickory, White IgE: <0.1 kU/L
IgE (Immunoglobulin E), Serum: 255 IU/mL (ref 6–495)
Johnson Grass IgE: <0.1 kU/L
Kentucky Bluegrass IgE: <0.1 kU/L
Maple/Box Elder IgE: <0.1 kU/L
Mucor Racemosus IgE: <0.1 kU/L
Oak, White IgE: 0.11 kU/L — AB
Penicillium Chrysogen IgE: <0.1 kU/L
Pigweed, Rough IgE: 0.11 kU/L — AB
Plantain, English IgE: <0.1 kU/L
Ragweed, Short IgE: <0.1 kU/L
Setomelanomma Rostrat: <0.1 kU/L
Timothy Grass IgE: <0.1 kU/L
White Mulberry IgE: <0.1 kU/L

## 2020-10-28 ENCOUNTER — Other Ambulatory Visit: Payer: Self-pay

## 2020-10-28 DIAGNOSIS — Z889 Allergy status to unspecified drugs, medicaments and biological substances status: Secondary | ICD-10-CM

## 2020-10-31 ENCOUNTER — Encounter: Payer: Self-pay | Admitting: Pulmonary Disease

## 2020-11-01 ENCOUNTER — Other Ambulatory Visit: Payer: Medicare Other

## 2020-11-02 ENCOUNTER — Inpatient Hospital Stay: Payer: Medicare Other | Attending: Hematology and Oncology

## 2020-11-08 ENCOUNTER — Encounter: Payer: Self-pay | Admitting: Family Medicine

## 2020-11-08 ENCOUNTER — Ambulatory Visit (INDEPENDENT_AMBULATORY_CARE_PROVIDER_SITE_OTHER): Payer: Medicare Other | Admitting: Family Medicine

## 2020-11-08 ENCOUNTER — Other Ambulatory Visit: Payer: Self-pay

## 2020-11-08 VITALS — BP 116/64 | HR 71 | Temp 97.7°F | Resp 16 | Ht 67.0 in | Wt 268.0 lb

## 2020-11-08 DIAGNOSIS — F411 Generalized anxiety disorder: Secondary | ICD-10-CM | POA: Diagnosis not present

## 2020-11-08 DIAGNOSIS — R7303 Prediabetes: Secondary | ICD-10-CM | POA: Diagnosis not present

## 2020-11-08 DIAGNOSIS — E039 Hypothyroidism, unspecified: Secondary | ICD-10-CM

## 2020-11-08 DIAGNOSIS — I10 Essential (primary) hypertension: Secondary | ICD-10-CM | POA: Diagnosis not present

## 2020-11-08 DIAGNOSIS — E782 Mixed hyperlipidemia: Secondary | ICD-10-CM | POA: Diagnosis not present

## 2020-11-08 DIAGNOSIS — F3341 Major depressive disorder, recurrent, in partial remission: Secondary | ICD-10-CM | POA: Diagnosis not present

## 2020-11-08 DIAGNOSIS — Z6841 Body Mass Index (BMI) 40.0 and over, adult: Secondary | ICD-10-CM

## 2020-11-08 MED ORDER — ACCU-CHEK GUIDE VI STRP: ORAL_STRIP | 5 refills | Status: DC

## 2020-11-08 NOTE — Progress Notes (Addendum)
Subjective:    Patient ID: Amanda Webb, female    DOB: [birth year], 49 y.o.   MRN: [id number]  Amanda Webb is a 49 y.o. female presenting on 11/08/2020 for Hypothyroidism and other  HPI   Here for Yearly and due for fasting labs today  Hypertension History of LE Edema Current Meds - Atenolol 50mg  daily, Reports good compliance, took meds today. Tolerating well, w/o complaints. Denies CP, dyspnea, HA, edema, dizziness / lightheadedness  Pre-Diabetes / Morbid Obesity BMI >41 Prior A1c elevated due for repeat now with labs Improving carb intake, now trying to fight craving Checking CBG 1-2 days. Interested in weight loss medication in future.  GAD Anxiety Major Depression recurrent in partial remission Followed by psychiatry / therapy On medication management   Additional updates  Asthma/Overlap / Allergy Syndrome Recent URI seen a few times for it and then referred to Pulmonologist, they did some initial allergy testing, confirmed several allergies including dust, onset after in shed getting christmas tree out triggered her recent symptoms. She is on Breztri inhaler now doing better. Awaiting allergist specialist apt.   Health Maintenance: COVID Booster updated last week, said it worsened her symptoms because of recent respiratory issue  Depression screen [facility] 2/9 11/08/2020 10/24/2020 12/30/2019  Decreased Interest 1 0 0  Down, Depressed, Hopeless 1 0 0  PHQ - 2 Score 2 0 0  Altered sleeping 3 0 -  Tired, decreased energy 2 0 -  Change in appetite 1 0 -  Feeling bad or failure about yourself  1 0 -  Trouble concentrating 0 0 -  Moving slowly or fidgety/restless 0 0 -  Suicidal thoughts 0 0 -  PHQ-9 Score 9 0 -  Difficult doing work/chores Somewhat difficult Not difficult at all -  Some recent data might be hidden   GAD 7 : Generalized Anxiety Score 10/24/2020 01/01/2019  Nervous, Anxious, on Edge 0 3  Control/stop worrying 0 3  Worry too much -  different things 0 3  Trouble relaxing 0 3  Restless 0 3  Easily annoyed or irritable 0 3  Afraid - awful might happen 0 3  Total GAD 7 Score 0 21  Anxiety Difficulty Not difficult at all Somewhat difficult      Past Medical History:  Diagnosis Date  . Allergy   . Anemia   . Anxiety   . Arrhythmia   . Arthritis   . Cervical dysplasia   . Cystitis   . Depression   . GERD (gastroesophageal reflux disease)   . Gross hematuria   . Headache   . Heart murmur    asymptomatic  . History of methicillin resistant staphylococcus aureus (MRSA) 2013  . HLD (hyperlipidemia)   . Hypertension   . Hypothyroid   . Lymphedema   . Palpitations   . Pernicious anemia   . Tobacco abuse    Past Surgical History:  Procedure Laterality Date  . CARPAL TUNNEL RELEASE Bilateral 2011  . FOOT SURGERY Right 2013   Plantar fascia  . ROBOTIC ASSISTED LAPAROSCOPIC CHOLECYSTECTOMY  07/15/2020   Dr Christian Mate  . TONSILLECTOMY  1979   with ear tubes   Social History   Socioeconomic History  . Marital status: Divorced    Spouse name: Not on file  . Number of children: Not on file  . Years of education: Some college  . Highest education level: Some college, no degree  Occupational History  . Occupation: disability   Tobacco Use  .  Smoking status: Current Every Day Smoker    Packs/day: 1.50    Years: 32.00    Pack years: 48.00    Types: Cigarettes  . Smokeless tobacco: Current User  . Tobacco comment: 1 ppd/ 10/25/20  Vaping Use  . Vaping Use: Never used  Substance and Sexual Activity  . Alcohol use: Yes    Comment: wine occ  . Drug use: No  . Sexual activity: Not Currently    Birth control/protection: Condom, Injection  Other Topics Concern  . Not on file  Social History Narrative   Lives with mom   Social Determinants of Health   Financial Resource Strain:   . Difficulty of Paying Living Expenses: Not on file  Food Insecurity:   . Worried About Charity fundraiser in the Last  Year: Not on file  . Ran Out of Food in the Last Year: Not on file  Transportation Needs:   . Lack of Transportation (Medical): Not on file  . Lack of Transportation (Non-Medical): Not on file  Physical Activity:   . Days of Exercise per Week: Not on file  . Minutes of Exercise per Session: Not on file  Stress:   . Feeling of Stress : Not on file  Social Connections:   . Frequency of Communication with Friends and Family: Not on file  . Frequency of Social Gatherings with Friends and Family: Not on file  . Attends Religious Services: Not on file  . Active Member of Clubs or Organizations: Not on file  . Attends Archivist Meetings: Not on file  . Marital Status: Not on file  Intimate Partner Violence:   . Fear of Current or Ex-Partner: Not on file  . Emotionally Abused: Not on file  . Physically Abused: Not on file  . Sexually Abused: Not on file   Family History  Problem Relation Age of Onset  . Depression Mother   . Mental illness Mother   . Alcohol abuse Mother   . Lung cancer Mother   . Cancer Maternal Grandmother   . Diabetes Maternal Grandmother   . Ovarian cancer Maternal Grandmother   . Kidney disease Maternal Grandfather   . Diabetes Father   . Mental illness Father   . Depression Father   . Drug abuse Father   . Depression Paternal Grandfather   . Drug abuse Paternal Grandfather   . Bladder Cancer Neg Hx   . Breast cancer Neg Hx    Current Outpatient Medications on File Prior to Visit  Medication Sig  . Accu-Chek FastClix Lancets MISC USE TO CHECK SUGAR UP TO TWICE DAILY AS DIRECTED  . acetaminophen (TYLENOL) 500 MG tablet Take 1,000 mg by mouth every 6 (six) hours as needed for moderate pain or headache.  . albuterol (VENTOLIN HFA) 108 (90 Base) MCG/ACT inhaler INHALE 1 TO 2 PUFFS BY MOUTH INTO LUNGS EVERY 4 TO 6 HOURS AS NEEDED FOR SHORTNESS OF BREATH  . ARIPiprazole (ABILIFY) 2 MG tablet Take 2 mg by mouth every morning.   Marland Kitchen atenolol (TENORMIN)  50 MG tablet Take 1 tablet (50 mg total) by mouth daily.  Marland Kitchen azelastine (ASTELIN) 0.1 % nasal spray Place 1 spray into both nostrils 2 (two) times daily. Use in each nostril as directed  . Budeson-Glycopyrrol-Formoterol (BREZTRI AEROSPHERE) 160-9-4.8 MCG/ACT AERO Inhale 2 puffs into the lungs in the morning and at bedtime.  . Cetirizine HCl (ZYRTEC ALLERGY) 10 MG CAPS Take 10 mg by mouth daily with lunch.   Marland Kitchen  Cyanocobalamin (B-12 COMPLIANCE INJECTION IJ) Inject 1 Dose as directed every 30 (thirty) days.  . fluticasone (FLONASE) 50 MCG/ACT nasal spray SHAKE LIQUID AND USE 2 SPRAYS IN EACH NOSTRIL EVERY DAY (Patient taking differently: Place 2 sprays into both nostrils daily. )  . fluvoxaMINE (LUVOX) 100 MG tablet Take 150 mg by mouth every morning.   . gabapentin (NEURONTIN) 100 MG capsule Take 200 mg by mouth 3 (three) times daily.   Marland Kitchen ketoconazole (NIZORAL) 2 % shampoo APPLY EXTERNALLY 2 TIMES A WEEK  . lamoTRIgine (LAMICTAL) 200 MG tablet Take 200 mg by mouth every morning.   Marland Kitchen levothyroxine (SYNTHROID) 50 MCG tablet Take 1 tablet (50 mcg total) by mouth daily before breakfast.  . medroxyPROGESTERone (DEPO-PROVERA) 150 MG/ML injection ADMINISTER 1 ML(150 MG) IN THE MUSCLE EVERY 3 MONTHS (Patient taking differently: Inject 150 mg into the muscle every 3 (three) months. )  . Multiple Vitamin (MULTIVITAMIN WITH MINERALS) TABS tablet Take 1 tablet by mouth daily.  . pantoprazole (PROTONIX) 20 MG tablet TAKE 1 TABLET(20 MG) BY MOUTH DAILY  . tretinoin (RETIN-A) 0.025 % cream APPLY EXTERNALLY TO THE AFFECTED AREA AT BEDTIME AS NEEDED (Patient taking differently: Apply 1 application topically at bedtime as needed (acne). )   No current facility-administered medications on file prior to visit.    Review of Systems  Constitutional: Negative for activity change, appetite change, chills, diaphoresis, fatigue and fever.  HENT: Negative for congestion and hearing loss.   Eyes: Negative for visual  disturbance.  Respiratory: Positive for cough. Negative for chest tightness, shortness of breath and wheezing.   Cardiovascular: Negative for chest pain, palpitations and leg swelling.  Gastrointestinal: Negative for abdominal pain, constipation, diarrhea, nausea and vomiting.  Genitourinary: Negative for dysuria, frequency and hematuria.  Musculoskeletal: Negative for arthralgias and neck pain.  Skin: Negative for rash.  Neurological: Negative for dizziness, weakness, light-headedness, numbness and headaches.  Hematological: Negative for adenopathy.  Psychiatric/Behavioral: Negative for behavioral problems, dysphoric mood and sleep disturbance.   Per HPI unless specifically indicated above      Objective:    BP 116/64   Pulse 71   Temp 97.7 F (36.5 C) (Temporal)   Resp 16   Ht 5\' 7"  (1.702 m)   Wt 268 lb (121.6 kg)   SpO2 97%   BMI 41.97 kg/m   Wt Readings from Last 3 Encounters:  11/08/20 268 lb (121.6 kg)  10/25/20 273 lb 9.6 oz (124.1 kg)  10/24/20 272 lb (123.4 kg)    Physical Exam Vitals and nursing note reviewed.  Constitutional:      General: She is not in acute distress.    Appearance: She is well-developed. She is obese. She is not diaphoretic.     Comments: Well-appearing, comfortable, cooperative  HENT:     Head: Normocephalic and atraumatic.  Eyes:     General:        Right eye: No discharge.        Left eye: No discharge.     Conjunctiva/sclera: Conjunctivae normal.     Pupils: Pupils are equal, round, and reactive to light.  Neck:     Thyroid: No thyromegaly.  Cardiovascular:     Rate and Rhythm: Normal rate and regular rhythm.     Heart sounds: Normal heart sounds. No murmur heard.   Pulmonary:     Effort: Pulmonary effort is normal. No respiratory distress.     Breath sounds: Normal breath sounds. No wheezing or rales.     Comments:  Occasional cough but good air movement. Abdominal:     General: Bowel sounds are normal. There is no  distension.     Palpations: Abdomen is soft. There is no mass.     Tenderness: There is no abdominal tenderness.  Musculoskeletal:        General: No tenderness. Normal range of motion.     Cervical back: Normal range of motion and neck supple.     Right lower leg: No edema.     Left lower leg: No edema.     Comments: Upper / Lower Extremities: - Normal muscle tone, strength bilateral upper extremities 5/5, lower extremities 5/5  Lymphadenopathy:     Cervical: No cervical adenopathy.  Skin:    General: Skin is warm and dry.     Findings: No erythema or rash.  Neurological:     Mental Status: She is alert and oriented to person, place, and time.     Comments: Distal sensation intact to light touch all extremities  Psychiatric:        Behavior: Behavior normal.     Comments: Well groomed, good eye contact, normal speech and thoughts      Results for orders placed or performed during the hospital encounter of 10/25/20  Allergen Panel (27) + IGE  Result Value Ref Range   Class Description Allergens Comment    IgE (Immunoglobulin E), Serum 255 6 - 495 IU/mL   D Pteronyssinus IgE 2.80 (A) Class III kU/L   D Farinae IgE 2.22 (A) Class III kU/L   Cat Dander IgE 0.18 (A) Class 0/I kU/L   Dog Dander IgE 3.14 (A) Class III kU/L   [place] Grass IgE <0.10 Class 0 kU/L   Timothy Grass IgE <0.10 Class 0 kU/L   [place] Bluegrass IgE <0.10 Class 0 kU/L   Johnson Grass IgE <0.10 Class 0 kU/L   B[place]ia Grass IgE <0.10 Class 0 kU/L   Cockroach, American IgE 0.42 (A) Class I kU/L   Penicillium Chrysogen IgE <0.10 Class 0 kU/L   Cladosporium Herbarum IgE <0.10 Class 0 kU/L   Aspergillus Fumigatus IgE <0.10 Class 0 kU/L   Mucor Racemosus IgE <0.10 Class 0 kU/L   Alternaria Alternata IgE <0.10 Class 0 kU/L   Setomelanomma Rostrat <0.10 Class 0 kU/L   Oak, White IgE 0.11 (A) Class 0/I kU/L   Elm, American IgE <0.10 Class 0 kU/L   Maple/Box Elder IgE <0.10 Class 0 kU/L   Common Silver Wendee Copp IgE  <0.10 Class 0 kU/L   Hickory, White IgE <0.10 Class 0 kU/L   White Mulberry IgE <0.10 Class 0 kU/L   Cedar, [place] IgE 0.18 (A) Class 0/I kU/L   Ragweed, Short IgE <0.10 Class 0 kU/L   Plantain, English IgE <0.10 Class 0 kU/L   Cocklebur IgE 0.30 (A) Class 0/I kU/L   Pigweed, Rough IgE 0.11 (A) Class 0/I kU/L      Assessment & Plan:   Problem List Items Addressed This Visit    Pre-diabetes   Relevant Medications   ACCU-CHEK GUIDE test strip   Major depressive disorder, recurrent, in partial remission (HCC)   Hypothyroidism - Primary   HLD (hyperlipidemia)   GAD (generalized anxiety disorder)   Essential hypertension   BMI 40.0-44.9, adult [facility]      Updated Health Maintenance information - UTD COVID booster Reviewed recent lab results with patient Encouraged improvement to lifestyle with diet and exercise - Goal of weight loss  #hypothyroidism Check labs TSH Free T4  Continue current levothyroxine 63mcg daily at this time.  #Hyperlipidemia Encourage lifestyle diet exercise Not on statin therapy  #PreDiabetes Overdue for A1c No recent lab. Not on therapy On improved lifestyle management Re order test strips checking up to 2 times daily If indicated we will discuss GLP1 therapy for weight loss or Diabetes/Hyperglycemia at next visit when ready in 3 months. Handout given consider sample at next visit  #HTN Controlled Continue Atenolol  #Asthma/overlap syndrome Allergies Awaiting upcoming new apt with Allergist for further management. Seems improved on Breztri after pulm work up.   Meds ordered this encounter  Medications  . ACCU-CHEK GUIDE test strip    Sig: CHECK BLOOD SUGAR UP TO TWICE DAILY AS DIRECTED    Dispense:  200 strip    Refill:  5      Follow up plan: Return in about 6 months (around 05/08/2021) for 3 month follow-up PreDM A1c, Weight loss medication.  [name], [facility] 11/08/2020, 9:58 AM

## 2020-11-08 NOTE — Patient Instructions (Addendum)
Thank you for coming to the office today.  Call by Mon 11/29 if not heard back yet.  [facility] [address] [place], [place]  [address] Phone: [phone]  (they processed or received referral on 11/16)  -----------------------------------  Call insurance find cost and coverage of the following  For Weight Loss indication - Saxenda (victoza) - daily - Wegovy (Ozempic) - weekly  ---------------------------------------------------------------------  For Diabetes or Pre-Diabetes indication  Ozempic (injection once week) Trulicity (injection once week)  We can get these newer meds at low cost if you are interested.  3 benefits - 1 significantly reduced A1c sugar, and may be able to reduce or stop metformin in future - 2 reduced appetite and weight loss with good results - 3 cardiovascular risk reduction, less likely to have heart attack/stroke   Please schedule a Follow-up Appointment to: Return in about 6 months (around 05/08/2021) for 3 month follow-up PreDM A1c, Weight loss medication.  If you have any other questions or concerns, please feel free to call the office or send a message through [place]. You may also schedule an earlier appointment if necessary.  Additionally, you may be receiving a survey about your experience at our office within a few days to 1 week by e-mail or mail. We value your feedback.  [name], DO [place]

## 2020-11-08 NOTE — Addendum Note (Signed)
Addended by: Olin Hauser on: 11/08/2020 02:09 PM   Modules accepted: Level of Service

## 2020-11-09 LAB — COMPLETE METABOLIC PANEL WITH GFR
AG Ratio: 1.6 (calc) (ref 1.0–2.5)
ALT: 29 U/L (ref 6–29)
AST: 18 U/L (ref 10–35)
Albumin: 4.4 g/dL (ref 3.6–5.1)
Alkaline phosphatase (APISO): 79 U/L (ref 31–125)
BUN: 7 mg/dL (ref 7–25)
CO2: 28 mmol/L (ref 20–32)
Calcium: 9.6 mg/dL (ref 8.6–10.2)
Chloride: 103 mmol/L (ref 98–110)
Creat: 0.64 mg/dL (ref 0.50–1.10)
GFR, Est African American: 122 mL/min/{1.73_m2} (ref >=60)
GFR, Est Non African American: 106 mL/min/{1.73_m2} (ref >=60)
Globulin: 2.8 g/dL (calc) (ref 1.9–3.7)
Glucose, Bld: 124 mg/dL — ABNORMAL HIGH (ref 65–99)
Potassium: 4.2 mmol/L (ref 3.5–5.3)
Sodium: 140 mmol/L (ref 135–146)
Total Bilirubin: 0.6 mg/dL (ref 0.2–1.2)
Total Protein: 7.2 g/dL (ref 6.1–8.1)

## 2020-11-09 LAB — LIPID PANEL
Cholesterol: 200 mg/dL — ABNORMAL HIGH (ref <200)
HDL: 44 mg/dL — ABNORMAL LOW (ref >=50)
LDL Cholesterol (Calc): 125 mg/dL (calc) — ABNORMAL HIGH
Non-HDL Cholesterol (Calc): 156 mg/dL (calc) — ABNORMAL HIGH (ref <130)
Total CHOL/HDL Ratio: 4.5 (calc) (ref <5.0)
Triglycerides: 190 mg/dL — ABNORMAL HIGH (ref <150)

## 2020-11-09 LAB — HEMOGLOBIN A1C
Hgb A1c MFr Bld: 6.6 % of total Hgb — ABNORMAL HIGH (ref <5.7)
Mean Plasma Glucose: 143 (calc)
eAG (mmol/L): 7.9 (calc)

## 2020-11-09 LAB — T4, FREE: Free T4: 1.3 ng/dL (ref 0.8–1.8)

## 2020-11-09 LAB — TSH: TSH: 2.28 mIU/L

## 2020-11-18 DIAGNOSIS — M25561 Pain in right knee: Secondary | ICD-10-CM | POA: Diagnosis not present

## 2020-11-21 NOTE — Progress Notes (Signed)
@Patient  ID: Amanda Webb, female    DOB: 28-Oct-1971, 49 y.o.   MRN: [id number]  Chief Complaint  Patient presents with  . Follow-up    taking breztri 1 puff BID. breathing has slightly improved since last OV. c/o prod cough with pale yellow mucus, sob with exertion and mild wheezing.    Referring provider: [name] *  HPI: 49 year old female, current every day smoker (48-pack-year history).  Past medical history significant for hypertension, chronic sinusitis, bronchitis, allergic rhinitis, hypothyroidism. Patient of Dr. Patsey Berthold, last seen on 10/25/20.  Previous [facility] pulmonary encounter: 10/25/20- Dr. Patsey Berthold  49 year old current smoker (1 PPD) who presents for evaluation of cough which started  2 weeks ago. She is referred by [facility]. The patient states that cough started approximately 2 weeks ago give or take. It is worse at nighttime when she is reclining. She states that she has been treated with antibiotics, prednisone and as needed inhaler without any help. She does not note anything that helps her at all. Prednisone only gave her side effects of anxiety and did not really help her cough. She lives in the same home as her mother, mother has had a cat for a number of years they did get a new cat 1-1/2 months ago. She has never been sensitive to cats previously. She had fevers chills and sweats and has been tested for COVID-19 and has been negative. She did note that symptoms started with a sinus infection. She notes that occasionally she will expectorate some yellowish sputum. Initially sputum was yellow to brown now is yellow to whitish. No hemoptysis. She has noted some urinary incontinence with the cough. Tessalon Perles helped to some degree. Being supine worsens the symptoms. She has had no orthopnea or paroxysmal nocturnal dyspnea, no chest pain, no lower extremity edema. She noted palpitations when she was taking prednisone. She voices no other  complaint. She has had no gastroesophageal reflux symptoms, she is on Protonix.  She has had a chest x-rays on October 25 and on 5 November that showed no acute disease, she had a CT scan of the chest on the 27th of that showed no worrisome lesions or infiltrates. These have been independently reviewed as well.  Review of Systems A 10 point review of systems was performed and it is as noted above otherwise negative.   11/22/2020- Interim hx  Patient presents today for a 1 month follow-up. She is taking Breztri 1 puff twice daily. She has noticed improvement in her cough and dyspnea. Her cough is 70-80% better. She is sleeping better at night. Previously she was not able to lay down d/t her cough. She has a occasional cough, once she is able to produce mucus she feels better. She is taking Mucinex and using Astelin nasal spray twice a day. She also takes Protonix for GERD, feels her symptoms are controlled. She has multiple allergens, she is more allergic to dogs then cats. She also has allergies to dust and dust mites. IgE was 255. She was referred to allergy. She does not take any over the counter antihistamines. She has a torn meniscus, asking if ok to take meloxicam or NSAIDS with Breztri inhaler.    Allergies  Allergen Reactions  . Bee Venom Anaphylaxis, Hives and Swelling    Carries Epi pen.   . Cat Hair Extract Itching, Other (See Comments) and Swelling    Allergic to trees, nuts, wheat, grass, cats & dogs - itchy watery eyes, swelling. Uses  Zyrtec & Flonase & Benadryl if really bad. Used to get allergy shots. Allergic to trees, nuts, wheat, grass, cats & dogs - itchy watery eyes, swelling. Uses Zyrtec & Flonase & Benadryl if really bad. Used to get allergy shots.  . Dog Epithelium Cough and Shortness Of Breath  . Dog Fennel Cough and Shortness Of Breath  . Dust Mite Extract Cough and Shortness Of Breath  . Tetracyclines & Related Nausea And Vomiting  . Lac Bovis Diarrhea and Nausea And  Vomiting  . Lactose Diarrhea and Nausea And Vomiting  . Milk Protein Diarrhea and Nausea And Vomiting  . Tape Other (See Comments) and Rash    Needs to use paper tape. Breaks out with severe rash, pulls skin off when using adhesive.    Immunization History  Administered Date(s) Administered  . Influenza, Seasonal, Injecte, Preservative Fre 10/21/2007, 09/01/2011, 10/07/2012  . Influenza,inj,Quad PF,6+ Mos 09/17/2013, 09/26/2016, 10/03/2017, 09/19/2018, 09/11/2019, 09/07/2020  . Influenza-Unspecified 08/18/2014, 09/10/2019  . PFIZER SARS-COV-2 Vaccination 02/16/2020, 03/18/2020, 11/02/2020  . Pneumococcal Polysaccharide-23 10/03/2017  . Tdap 10/21/2007    Past Medical History:  Diagnosis Date  . Allergy   . Anemia   . Anxiety   . Arrhythmia   . Arthritis   . Cervical dysplasia   . Cystitis   . Depression   . GERD (gastroesophageal reflux disease)   . Gross hematuria   . Headache   . Heart murmur    asymptomatic  . History of methicillin resistant staphylococcus aureus (MRSA) 2013  . HLD (hyperlipidemia)   . Hypertension   . Hypothyroid   . Lymphedema   . Palpitations   . Pernicious anemia   . Tobacco abuse     Tobacco History: Social History   Tobacco Use  Smoking Status Current Every Day Smoker  . Packs/day: 1.50  . Years: 32.00  . Pack years: 48.00  . Types: Cigarettes  Smokeless Tobacco Current User  Tobacco Comment   1 ppd/ 11/22/2020   Ready to quit: Not Answered Counseling given: Not Answered Comment: 1 ppd/ 11/22/2020   Outpatient Medications Prior to Visit  Medication Sig Dispense Refill  . Accu-Chek FastClix Lancets MISC USE TO CHECK SUGAR UP TO TWICE DAILY AS DIRECTED 102 each 5  . ACCU-CHEK GUIDE test strip CHECK BLOOD SUGAR UP TO TWICE DAILY AS DIRECTED 200 strip 5  . acetaminophen (TYLENOL) 500 MG tablet Take 1,000 mg by mouth every 6 (six) hours as needed for moderate pain or headache.    . albuterol (VENTOLIN HFA) 108 (90 Base) MCG/ACT  inhaler INHALE 1 TO 2 PUFFS BY MOUTH INTO LUNGS EVERY 4 TO 6 HOURS AS NEEDED FOR SHORTNESS OF BREATH 18 g 2  . ARIPiprazole (ABILIFY) 2 MG tablet Take 2 mg by mouth every morning.     Marland Kitchen atenolol (TENORMIN) 50 MG tablet Take 1 tablet (50 mg total) by mouth daily. 90 tablet 1  . azelastine (ASTELIN) 0.1 % nasal spray Place 1 spray into both nostrils 2 (two) times daily. Use in each nostril as directed 30 mL 5  . Budeson-Glycopyrrol-Formoterol (BREZTRI AEROSPHERE) 160-9-4.8 MCG/ACT AERO Inhale 2 puffs into the lungs in the morning and at bedtime. 5.9 g 0  . Cetirizine HCl (ZYRTEC ALLERGY) 10 MG CAPS Take 10 mg by mouth daily with lunch.     . Cyanocobalamin (B-12 COMPLIANCE INJECTION IJ) Inject 1 Dose as directed every 30 (thirty) days.    . fluticasone (FLONASE) 50 MCG/ACT nasal spray SHAKE LIQUID AND USE 2 SPRAYS IN  EACH NOSTRIL EVERY DAY (Patient taking differently: Place 2 sprays into both nostrils daily. ) 48 g 1  . fluvoxaMINE (LUVOX) 100 MG tablet Take 150 mg by mouth every morning.     . gabapentin (NEURONTIN) 100 MG capsule Take 200 mg by mouth 3 (three) times daily.     Marland Kitchen ketoconazole (NIZORAL) 2 % shampoo APPLY EXTERNALLY 2 TIMES A WEEK 120 mL 2  . lamoTRIgine (LAMICTAL) 200 MG tablet Take 200 mg by mouth every morning.     Marland Kitchen levothyroxine (SYNTHROID) 50 MCG tablet Take 1 tablet (50 mcg total) by mouth daily before breakfast. 90 tablet 1  . medroxyPROGESTERone (DEPO-PROVERA) 150 MG/ML injection ADMINISTER 1 ML(150 MG) IN THE MUSCLE EVERY 3 MONTHS (Patient taking differently: Inject 150 mg into the muscle every 3 (three) months. ) 1 mL 1  . Multiple Vitamin (MULTIVITAMIN WITH MINERALS) TABS tablet Take 1 tablet by mouth daily.    . pantoprazole (PROTONIX) 20 MG tablet TAKE 1 TABLET(20 MG) BY MOUTH DAILY 90 tablet 1  . tretinoin (RETIN-A) 0.025 % cream APPLY EXTERNALLY TO THE AFFECTED AREA AT BEDTIME AS NEEDED (Patient taking differently: Apply 1 application topically at bedtime as needed  (acne). ) 45 g 2   No facility-administered medications prior to visit.   Review of Systems  Review of Systems  Constitutional: Negative.   Respiratory: Positive for cough. Negative for chest tightness and wheezing.   Cardiovascular: Negative.    Physical Exam  BP 120/74 (BP Location: Left Arm, Cuff Size: Large)   Pulse 75   Temp 97.8 F (36.6 C) (Temporal)   Ht 5\' 7"  (1.702 m)   Wt 267 lb 6.4 oz (121.3 kg)   SpO2 96%   BMI 41.88 kg/m  Physical Exam Constitutional:      Appearance: Normal appearance.  Cardiovascular:     Rate and Rhythm: Normal rate and regular rhythm.  Pulmonary:     Effort: Pulmonary effort is normal.     Breath sounds: Normal breath sounds.  Neurological:     General: No focal deficit present.     Mental Status: She is alert and oriented to person, place, and time. Mental status is at baseline.  Psychiatric:        Mood and Affect: Mood normal.        Behavior: Behavior normal.        Thought Content: Thought content normal.        Judgment: Judgment normal.      Lab Results:  CBC    Component Value Date/Time   WBC 10.5 07/19/2020 2126   RBC 4.51 07/19/2020 2126   HGB 14.4 07/19/2020 2126   HCT 42.4 07/19/2020 2126   PLT 285 07/19/2020 2126   MCV 94.0 07/19/2020 2126   MCH 31.9 07/19/2020 2126   MCHC 34.0 07/19/2020 2126   RDW 12.5 07/19/2020 2126   LYMPHSABS 2.5 07/19/2020 2126   MONOABS 0.6 07/19/2020 2126   EOSABS 0.2 07/19/2020 2126   BASOSABS 0.0 07/19/2020 2126    BMET    Component Value Date/Time   NA 140 11/08/2020 1022   K 4.2 11/08/2020 1022   CL 103 11/08/2020 1022   CO2 28 11/08/2020 1022   GLUCOSE 124 (H) 11/08/2020 1022   BUN 7 11/08/2020 1022   CREATININE 0.64 11/08/2020 1022   CALCIUM 9.6 11/08/2020 1022   GFRNONAA 106 11/08/2020 1022   GFRAA 122 11/08/2020 1022    BNP No results found for: BNP  ProBNP No results found  for: PROBNP  Imaging: No results found.   Assessment & Plan:   Cough - Felt  to have underlying COPD/asthma overlap. Patient needs PFTs to confirm. She has noticed 70% improvement in cough since starting [place]. She was concerned about potential side effects. Verified with pharmacy that she can take inhaler with NSAIDS. Recommend continuing Breztri 1-2 puffs twice daily. FU in 3 months with Dr. Patsey Berthold.   Allergic rhinitis - She has multiple allergens. IgE was 255. PND likely contributing to cough as well. - Recommend resuming Zyrtec and Flonase nasal spray, continue Astelin nasal spray  - Consider adding Singulair which is a leukotriene inhibitor for asthma/allergies (see attached below) - Referred to Allergy by Dr. Guadlupe Spanish, NP 11/23/2020

## 2020-11-22 ENCOUNTER — Encounter: Payer: Self-pay | Admitting: Primary Care

## 2020-11-22 ENCOUNTER — Ambulatory Visit (INDEPENDENT_AMBULATORY_CARE_PROVIDER_SITE_OTHER): Payer: Medicare Other | Admitting: Primary Care

## 2020-11-22 ENCOUNTER — Telehealth: Payer: Self-pay | Admitting: Family Medicine

## 2020-11-22 ENCOUNTER — Telehealth: Payer: Self-pay | Admitting: Primary Care

## 2020-11-22 ENCOUNTER — Other Ambulatory Visit: Payer: Self-pay

## 2020-11-22 DIAGNOSIS — J3089 Other allergic rhinitis: Secondary | ICD-10-CM | POA: Diagnosis not present

## 2020-11-22 DIAGNOSIS — R059 Cough, unspecified: Secondary | ICD-10-CM

## 2020-11-22 MED ORDER — BREZTRI AEROSPHERE 160-9-4.8 MCG/ACT IN AERO: 1.0000 | INHALATION_SPRAY | Freq: Two times a day (BID) | RESPIRATORY_TRACT | 0 refills | Status: AC

## 2020-11-22 MED ORDER — BREZTRI AEROSPHERE 160-9-4.8 MCG/ACT IN AERO: 1.0000 | INHALATION_SPRAY | Freq: Two times a day (BID) | RESPIRATORY_TRACT | 6 refills | Status: DC

## 2020-11-22 NOTE — Patient Instructions (Addendum)
RecommendL - Continue Breztri 1 puff twice daily (with option of increasing to 2 puffs) - Resume Zyrtec daily  - Resume Flonase nasal spray - Continue Astelin nasal spray  - Consider adding Singulair which is a leukotriene inhibitor for asthma/allergies (see attached below) - I will ask our pharmacist about taking NSAIDS with inhaler  - Encourage smoking cessation, taper amount you smoke, use nicotine replacement therapy such as gum/patches and pick a quit date   Rx: - Breztri 1 puff twice daily   Follow-up: - 3-4 months with Dr. Patsey Berthold    Montelukast oral tablets What is this medicine? MONTELUKAST (mon te LOO kast) is used to prevent and treat the symptoms of asthma. It is also used to treat allergies. Do not use for an acute asthma attack. This medicine may be used for other purposes; ask your health care provider or pharmacist if you have questions. COMMON BRAND NAME(S): Singulair What should I tell my health care provider before I take this medicine? They need to know if you have any of these conditions:  liver disease  an unusual or allergic reaction to montelukast, other medicines, foods, dyes, or preservatives  pregnant or trying to get pregnant  breast-feeding How should I use this medicine? This medicine should be given by mouth. Follow the directions on the prescription label. Take this medicine at the same time every day. You may take this medicine with or without meals. Do not chew the tablets. Do not stop taking your medicine unless your doctor tells you to. Talk to your pediatrician regarding the use of this medicine in children. Special care may be needed. While this drug may be prescribed for children as young as 49 years of age for selected conditions, precautions do apply. Overdosage: If you think you have taken too much of this medicine contact a poison control center or emergency room at once. NOTE: This medicine is only for you. Do not share this medicine  with others. What if I miss a dose? If you miss a dose, skip it. Take your next dose at the normal time. Do not take extra or 2 doses at the same time to make up for the missed dose. What may interact with this medicine?  anti-infectives like rifampin and rifabutin  medicines for seizures like phenytoin, phenobarbital, and carbamazepine This list may not describe all possible interactions. Give your health care provider a list of all the medicines, herbs, non-prescription drugs, or dietary supplements you use. Also tell them if you smoke, drink alcohol, or use illegal drugs. Some items may interact with your medicine. What should I watch for while using this medicine? Visit your doctor or health care professional for regular checks on your progress. Tell your doctor or health care professional if your allergy or asthma symptoms do not improve. Take your medicine even when you do not have symptoms. Do not stop taking any of your medicine(s) unless your doctor tells you to. If you have asthma, talk to your doctor about what to do in an acute asthma attack. Always have your inhaled rescue medicine for asthma attacks with you. Patients and their families should watch for new or worsening thoughts of suicide or depression. Also watch for sudden changes in feelings such as feeling anxious, agitated, panicky, irritable, hostile, aggressive, impulsive, severely restless, overly excited and hyperactive, or not being able to sleep. Any worsening of mood or thoughts of suicide or dying should be reported to your health care professional right away. What side  effects may I notice from receiving this medicine? Side effects that you should report to your doctor or health care professional as soon as possible:  allergic reactions like skin rash or hives, or swelling of the face, lips, or tongue  breathing problems  changes in emotions or moods  confusion  depressed mood  fever or  infection  hallucinations  joint pain  painful lumps under the skin  pain, tingling, numbness in the hands or feet  redness, blistering, peeling, or loosening of the skin, including inside the mouth  restlessness  seizures  sleep walking  signs and symptoms of infection like fever; chills; cough; sore throat; flu-like illness  signs and symptoms of liver injury like dark yellow or brown urine; general ill feeling or flu-like symptoms; light-colored stools; loss of appetite; nausea; right upper belly pain; unusually weak or tired; yellowing of the eyes or skin  sinus pain or swelling  stuttering  suicidal thoughts or other mood changes  tremors  trouble sleeping  uncontrolled muscle movements  unusual bleeding or bruising  vivid or bad dreams Side effects that usually do not require medical attention (report to your doctor or health care professional if they continue or are bothersome):  dizziness  drowsiness  headache  runny nose  stomach upset  tiredness This list may not describe all possible side effects. Call your doctor for medical advice about side effects. You may report side effects to FDA at 1-800-FDA-1088. Where should I keep my medicine? Keep out of the reach of children. Store at room temperature between 15 and 30 degrees C (59 and 86 degrees F). Protect from light and moisture. Keep this medicine in the original bottle. Throw away any unused medicine after the expiration date. NOTE: This sheet is a summary. It may not cover all possible information. If you have questions about this medicine, talk to your doctor, pharmacist, or health care provider.  2020 Elsevier/Gold Standard (2019-04-03 12:54:33)   Steps to Quit Smoking Smoking tobacco is the leading cause of preventable death. It can affect almost every organ in the body. Smoking puts you and people around you at risk for many serious, long-lasting (chronic) diseases. Quitting smoking can be  hard, but it is one of the best things that you can do for your health. It is never too late to quit. How do I get ready to quit? When you decide to quit smoking, make a plan to help you succeed. Before you quit:  Pick a date to quit. Set a date within the next 2 weeks to give you time to prepare.  Write down the reasons why you are quitting. Keep this list in places where you will see it often.  Tell your family, friends, and co-workers that you are quitting. Their support is important.  Talk with your doctor about the choices that may help you quit.  Find out if your health insurance will pay for these treatments.  Know the people, places, things, and activities that make you want to smoke (triggers). Avoid them. What first steps can I take to quit smoking?  Throw away all cigarettes at home, at work, and in your car.  Throw away the things that you use when you smoke, such as ashtrays and lighters.  Clean your car. Make sure to empty the ashtray.  Clean your home, including curtains and carpets. What can I do to help me quit smoking? Talk with your doctor about taking medicines and seeing a counselor at the same  time. You are more likely to succeed when you do both.  If you are pregnant or breastfeeding, talk with your doctor about counseling or other ways to quit smoking. Do not take medicine to help you quit smoking unless your doctor tells you to do so. To quit smoking: Quit right away  Quit smoking totally, instead of slowly cutting back on how much you smoke over a period of time.  Go to counseling. You are more likely to quit if you go to counseling sessions regularly. Take medicine You may take medicines to help you quit. Some medicines need a prescription, and some you can buy over-the-counter. Some medicines may contain a drug called nicotine to replace the nicotine in cigarettes. Medicines may:  Help you to stop having the desire to smoke (cravings).  Help to stop  the problems that come when you stop smoking (withdrawal symptoms). Your doctor may ask you to use:  Nicotine patches, gum, or lozenges.  Nicotine inhalers or sprays.  Non-nicotine medicine that is taken by mouth. Find resources Find resources and other ways to help you quit smoking and remain smoke-free after you quit. These resources are most helpful when you use them often. They include:  Online chats with a Social worker.  Phone quitlines.  Printed Furniture conservator/restorer.  Support groups or group counseling.  Text messaging programs.  Mobile phone apps. Use apps on your mobile phone or tablet that can help you stick to your quit plan. There are many free apps for mobile phones and tablets as well as websites. Examples include Quit Guide from the [employer] and smokefree.gov  What things can I do to make it easier to quit?   Talk to your family and friends. Ask them to support and encourage you.  Call a phone quitline (1-800-QUIT-NOW), reach out to support groups, or work with a Social worker.  Ask people who smoke to not smoke around you.  Avoid places that make you want to smoke, such as: ? Bars. ? Parties. ? Smoke-break areas at work.  Spend time with people who do not smoke.  Lower the stress in your life. Stress can make you want to smoke. Try these things to help your stress: ? Getting regular exercise. ? Doing deep-breathing exercises. ? Doing yoga. ? Meditating. ? Doing a body scan. To do this, close your eyes, focus on one area of your body at a time from head to toe. Notice which parts of your body are tense. Try to relax the muscles in those areas. How will I feel when I quit smoking? Day 1 to 3 weeks Within the first 24 hours, you may start to have some problems that come from quitting tobacco. These problems are very bad 2-3 days after you quit, but they do not often last for more than 2-3 weeks. You may get these symptoms:  Mood swings.  Feeling restless, nervous,  angry, or annoyed.  Trouble concentrating.  Dizziness.  Strong desire for high-sugar foods and nicotine.  Weight gain.  Trouble pooping (constipation).  Feeling like you may vomit (nausea).  Coughing or a sore throat.  Changes in how the medicines that you take for other issues work in your body.  Depression.  Trouble sleeping (insomnia). Week 3 and afterward After the first 2-3 weeks of quitting, you may start to notice more positive results, such as:  Better sense of smell and taste.  Less coughing and sore throat.  Slower heart rate.  Lower blood pressure.  Clearer skin.  Better breathing.  Fewer sick days. Quitting smoking can be hard. Do not give up if you fail the first time. Some people need to try a few times before they succeed. Do your best to stick to your quit plan, and talk with your doctor if you have any questions or concerns. Summary  Smoking tobacco is the leading cause of preventable death. Quitting smoking can be hard, but it is one of the best things that you can do for your health.  When you decide to quit smoking, make a plan to help you succeed.  Quit smoking right away, not slowly over a period of time.  When you start quitting, seek help from your doctor, family, or friends. This information is not intended to replace advice given to you by your health care provider. Make sure you discuss any questions you have with your health care provider. Document Revised: 08/28/2019 Document Reviewed: 02/21/2019 Elsevier Patient Education  [place].

## 2020-11-22 NOTE — Telephone Encounter (Signed)
Please let patient know I spoke with our pharmacist and she can take NSAID while on Breztri inhaler.

## 2020-11-22 NOTE — Telephone Encounter (Signed)
Patient is aware of below message and voiced her understanding.  Nothing further needed.   

## 2020-11-22 NOTE — Telephone Encounter (Signed)
Can patient take NSAIDS or meloxicam with Breztri?

## 2020-11-22 NOTE — Telephone Encounter (Signed)
Received PA for Accu-check test strip and Called pharmacy, gave verbal for checking blood sugar once daily --approved and patient notified.

## 2020-11-23 ENCOUNTER — Encounter: Payer: Self-pay | Admitting: Primary Care

## 2020-11-23 NOTE — Assessment & Plan Note (Addendum)
-   She has multiple allergens. IgE was 255. PND likely contributing to cough as well. - Recommend resuming Zyrtec and Flonase nasal spray, continue Astelin nasal spray  - Consider adding Singulair which is a leukotriene inhibitor for asthma/allergies (see attached below) - Referred to Allergy by Dr. Patsey Berthold

## 2020-11-23 NOTE — Assessment & Plan Note (Addendum)
-   Felt to have underlying COPD/asthma overlap. Patient needs PFTs to confirm. She has noticed 70% improvement in cough since starting [place]. She was concerned about potential side effects. Verified with pharmacy that she can take inhaler with NSAIDS. Recommend continuing Breztri 1-2 puffs twice daily. FU in 3 months with Dr. Patsey Berthold.

## 2020-11-24 ENCOUNTER — Telehealth: Payer: Self-pay | Admitting: Primary Care

## 2020-11-24 NOTE — Telephone Encounter (Signed)
PA for Amanda Webb has been started via CMM.  [id number]  Will receive determination within 72hr via fax.

## 2020-11-28 NOTE — Telephone Encounter (Signed)
Per CMM--PA has been approved.   ATC [facility] to make them aware of approval. Pharmacy is currently closed.  Will call back after 9:00a

## 2020-11-29 NOTE — Telephone Encounter (Signed)
ATC [facility] and was placed on hold for >83min.   Will call back

## 2020-11-29 NOTE — Telephone Encounter (Signed)
Spoke to [place] with [facility] and made her aware of approval. Nothing further needed.

## 2020-11-30 ENCOUNTER — Inpatient Hospital Stay: Payer: Medicare Other

## 2020-12-01 ENCOUNTER — Other Ambulatory Visit: Payer: Self-pay

## 2020-12-01 ENCOUNTER — Inpatient Hospital Stay: Payer: Medicare Other

## 2020-12-01 ENCOUNTER — Inpatient Hospital Stay: Payer: Medicare Other | Attending: Hematology and Oncology

## 2020-12-01 DIAGNOSIS — E538 Deficiency of other specified B group vitamins: Secondary | ICD-10-CM | POA: Diagnosis not present

## 2020-12-01 DIAGNOSIS — D509 Iron deficiency anemia, unspecified: Secondary | ICD-10-CM

## 2020-12-01 LAB — CBC WITH DIFFERENTIAL/PLATELET
Abs Immature Granulocytes: 0.06 10*3/uL (ref 0.00–0.07)
Basophils Absolute: 0 10*3/uL (ref 0.0–0.1)
Basophils Relative: 0 %
Eosinophils Absolute: 0.2 10*3/uL (ref 0.0–0.5)
Eosinophils Relative: 2 %
HCT: 43.3 % (ref 36.0–46.0)
Hemoglobin: 14.7 g/dL (ref 12.0–15.0)
Immature Granulocytes: 1 %
Lymphocytes Relative: 33 %
Lymphs Abs: 3.4 10*3/uL (ref 0.7–4.0)
MCH: 31.1 pg (ref 26.0–34.0)
MCHC: 33.9 g/dL (ref 30.0–36.0)
MCV: 91.5 fL (ref 80.0–100.0)
Monocytes Absolute: 0.6 10*3/uL (ref 0.1–1.0)
Monocytes Relative: 6 %
Neutro Abs: 5.9 10*3/uL (ref 1.7–7.7)
Neutrophils Relative %: 58 %
Platelets: 266 10*3/uL (ref 150–400)
RBC: 4.73 MIL/uL (ref 3.87–5.11)
RDW: 12.8 % (ref 11.5–15.5)
WBC: 10.2 10*3/uL (ref 4.0–10.5)
nRBC: 0 % (ref 0.0–0.2)

## 2020-12-01 LAB — FOLATE: Folate: 19.8 ng/mL (ref >5.9)

## 2020-12-01 LAB — FERRITIN: Ferritin: 76 ng/mL (ref 11–307)

## 2020-12-01 MED ORDER — CYANOCOBALAMIN 1000 MCG/ML IJ SOLN
1000.0000 ug | Freq: Once | INTRAMUSCULAR | Status: AC
  Administered 2020-12-01: 1000 ug via INTRAMUSCULAR

## 2020-12-01 MED ORDER — CYANOCOBALAMIN 1000 MCG/ML IJ SOLN
INTRAMUSCULAR | Status: AC
  Filled 2020-12-01: qty 1

## 2020-12-01 NOTE — Progress Notes (Signed)
Agree with the details of the visit as noted by Elizabeth Walsh, NP.  C. Laura Anyla Israelson, MD [facility] 

## 2020-12-05 ENCOUNTER — Other Ambulatory Visit: Payer: Self-pay | Admitting: Primary Care

## 2020-12-06 ENCOUNTER — Ambulatory Visit: Payer: Medicare Other

## 2020-12-06 ENCOUNTER — Other Ambulatory Visit: Payer: Medicare Other

## 2020-12-19 MED ORDER — ALBUTEROL SULFATE HFA 108 (90 BASE) MCG/ACT IN AERS: INHALATION_SPRAY | RESPIRATORY_TRACT | 2 refills | Status: DC

## 2020-12-19 NOTE — Telephone Encounter (Signed)
Dr. Jayme Cloud, please advise if okay to refill albuterol HFA. This medication was not originally prescribed by our office.

## 2020-12-19 NOTE — Telephone Encounter (Signed)
Ok for Albuterol prn.

## 2020-12-22 ENCOUNTER — Other Ambulatory Visit: Payer: Self-pay | Admitting: Family Medicine

## 2020-12-22 DIAGNOSIS — J309 Allergic rhinitis, unspecified: Secondary | ICD-10-CM

## 2020-12-28 NOTE — Progress Notes (Deleted)
Last depo inj: 10/21/20 UPT: N/A Side effects: none Next Depo- Provera injection due: 01/06/21-01/20/21 Annual exam due: 09/27/21

## 2021-01-05 ENCOUNTER — Inpatient Hospital Stay: Payer: Medicare Other | Attending: Hematology and Oncology

## 2021-01-05 ENCOUNTER — Inpatient Hospital Stay: Payer: Medicare Other

## 2021-01-05 ENCOUNTER — Other Ambulatory Visit: Payer: Self-pay | Admitting: Obstetrics and Gynecology

## 2021-01-05 ENCOUNTER — Other Ambulatory Visit: Payer: Self-pay

## 2021-01-05 DIAGNOSIS — D509 Iron deficiency anemia, unspecified: Secondary | ICD-10-CM

## 2021-01-05 DIAGNOSIS — E538 Deficiency of other specified B group vitamins: Secondary | ICD-10-CM | POA: Diagnosis not present

## 2021-01-05 MED ORDER — CYANOCOBALAMIN 1000 MCG/ML IJ SOLN
INTRAMUSCULAR | Status: AC
  Filled 2021-01-05: qty 1

## 2021-01-05 MED ORDER — CYANOCOBALAMIN 1000 MCG/ML IJ SOLN
1000.0000 ug | Freq: Once | INTRAMUSCULAR | Status: AC
  Administered 2021-01-05: 1000 ug via INTRAMUSCULAR

## 2021-01-05 NOTE — Progress Notes (Signed)
Last depo inj: 10/21/20 UPT: N/A Side effects:none Next depo due: 01/06/21-01/20/21 Annual exam due:09/27/21

## 2021-01-06 ENCOUNTER — Ambulatory Visit (INDEPENDENT_AMBULATORY_CARE_PROVIDER_SITE_OTHER): Payer: Medicare Other

## 2021-01-06 ENCOUNTER — Ambulatory Visit: Payer: Medicare Other

## 2021-01-06 DIAGNOSIS — Z3042 Encounter for surveillance of injectable contraceptive: Secondary | ICD-10-CM | POA: Diagnosis not present

## 2021-01-06 MED ORDER — MEDROXYPROGESTERONE ACETATE 150 MG/ML IM SUSP
150.0000 mg | Freq: Once | INTRAMUSCULAR | Status: AC
  Administered 2021-01-06: 150 mg via INTRAMUSCULAR

## 2021-01-19 DIAGNOSIS — F4310 Post-traumatic stress disorder, unspecified: Secondary | ICD-10-CM | POA: Diagnosis not present

## 2021-01-19 DIAGNOSIS — Z79899 Other long term (current) drug therapy: Secondary | ICD-10-CM | POA: Diagnosis not present

## 2021-01-27 DIAGNOSIS — Z1231 Encounter for screening mammogram for malignant neoplasm of breast: Secondary | ICD-10-CM | POA: Diagnosis not present

## 2021-01-30 DIAGNOSIS — J019 Acute sinusitis, unspecified: Secondary | ICD-10-CM | POA: Diagnosis not present

## 2021-01-30 DIAGNOSIS — Z8709 Personal history of other diseases of the respiratory system: Secondary | ICD-10-CM | POA: Diagnosis not present

## 2021-01-30 DIAGNOSIS — J189 Pneumonia, unspecified organism: Secondary | ICD-10-CM | POA: Diagnosis not present

## 2021-01-30 DIAGNOSIS — R062 Wheezing: Secondary | ICD-10-CM | POA: Diagnosis not present

## 2021-02-02 ENCOUNTER — Telehealth: Payer: Medicare Other | Admitting: Family Medicine

## 2021-02-02 ENCOUNTER — Inpatient Hospital Stay: Payer: Medicare Other

## 2021-02-02 ENCOUNTER — Encounter: Payer: Self-pay | Admitting: Family Medicine

## 2021-02-02 ENCOUNTER — Ambulatory Visit (INDEPENDENT_AMBULATORY_CARE_PROVIDER_SITE_OTHER): Payer: Medicare Other | Admitting: Vascular Surgery

## 2021-02-02 DIAGNOSIS — J189 Pneumonia, unspecified organism: Secondary | ICD-10-CM

## 2021-02-02 NOTE — Progress Notes (Signed)
Ms. Amanda, Webb are scheduled for a virtual visit with your provider today.    Just as we do with appointments in the office, we must obtain your consent to participate.  Your consent will be active for this visit and any virtual visit you may have with one of our providers in the next 365 days.    If you have a MyChart account, I can also send a copy of this consent to you electronically.  All virtual visits are billed to your insurance company just like a traditional visit in the office.  As this is a virtual visit, video technology does not allow for your provider to perform a traditional examination.  This may limit your provider's ability to fully assess your condition.  If your provider identifies any concerns that need to be evaluated in person or the need to arrange testing such as labs, EKG, etc, we will make arrangements to do so.    Although advances in technology are sophisticated, we cannot ensure that it will always work on either your end or our end.  If the connection with a video visit is poor, we may have to switch to a telephone visit.  With either a video or telephone visit, we are not always able to ensure that we have a secure connection.   I need to obtain your verbal consent now.   Are you willing to proceed with your visit today?   Amanda Webb has provided verbal consent on 02/02/2021 for a virtual visit (video or telephone).   Amanda Mayo, NP 02/02/2021  10:34 AM   Date:  02/02/2021   ID:  Amanda Webb 03-14-1971, MRN [id number]  Patient Location: Home Provider Location: Home Office   Participants: Patient and Provider for Visit and Wrap up  Method of visit: Video  Location of Patient: Home Location of Provider: Home Office Consent was obtain for visit over the video. Services rendered by provider: Visit was performed via video  A video enabled telemedicine application was used and I verified that I am speaking with the correct person using  two identifiers.  PCP:  Amanda Hauser, DO   Chief Complaint:  PNA  History of Present Illness:    Amanda Webb is a 50 y.o. female with history as stated below. Presents video telehealth for an acute care visit secondary to concern about being on the right medications for newly diagnosed pneumonia.  Went to a fast track- urgent care on 2/14 due to nasal congestion for 8 days.  As per UC note:  Associated symptoms of productive cough with green/yellow sputum, wheezing, shortness of breath, post nasal drip, muscle aches, headaches. From coughing so much she reports to diffuse rib discomfort. Patient denies any known COVID exposure. She had taken an at home COVID test x 4 days ago which was negative. Cough is waking patient up at night frequently over the past three days and she has been unable to get any sleep. Patient reports that cough is worse with lying down flat. Notes that shortness of breath is more predominant when she is walking around the house. Patient has had 3 sinus infections last year back to back where she was treated with oral antibiotics. Patient has already seen ENT and had CT scan of her sinuses which was negative per patient. She was recommend to start allergy shots to help with her frequent sinus infections. She takes a maint inhaler for her asthma which has been helping prior to  her getting sick with URI. Over the past few days she has been using her rescue inhaler 3-4 times daily for her symptoms of wheezing, shortness of breath.   She calls in today a little anxious about making sure the medications she is on are right.  Of note she also just loss her father last week and her dog this morning. She is having a hard time with this and does not like to be sick,   She reports smoking as well, but reduced due to being sick.  She has on going green mucus she is coughing up, mild chest discomfort. Nothing is worse per her, she just does not feel well.  Past  Medical, Surgical, Social History, Allergies, and Medications have been Reviewed.  Past Medical History:  Diagnosis Date  . Allergy   . Anemia   . Anxiety   . Arrhythmia   . Arthritis   . Cervical dysplasia   . Cystitis   . Depression   . GERD (gastroesophageal reflux disease)   . Gross hematuria   . Headache   . Heart murmur    asymptomatic  . History of methicillin resistant staphylococcus aureus (MRSA) 2013  . HLD (hyperlipidemia)   . Hypertension   . Hypothyroid   . Lymphedema   . Palpitations   . Pernicious anemia   . Tobacco abuse     No outpatient medications have been marked as taking for the 02/02/21 encounter (Appointment) with Amanda Mayo, NP.     Allergies:   Bee venom, Cat hair extract, Dog epithelium, Dog fennel, Dust mite extract, Tetracyclines & related, Lac bovis, Lactose, Milk protein, and Tape   ROS See HPI for history of present illness.  Physical Exam Constitutional:      Appearance: Normal appearance.  HENT:     Head: Normocephalic and atraumatic.     Right Ear: External ear normal.     Left Ear: External ear normal.     Nose: Nose normal.  Eyes:     Extraocular Movements: Extraocular movements intact.     Conjunctiva/sclera: Conjunctivae normal.     Pupils: Pupils are equal, round, and reactive to light.  Pulmonary:     Comments: No shortness of breath, or cough noted in conversation Musculoskeletal:        General: Normal range of motion.     Cervical back: Normal range of motion.  Neurological:     Mental Status: She is alert and oriented to person, place, and time.  Psychiatric:        Attention and Perception: Attention and perception normal.        Mood and Affect: Affect is tearful.        Speech: Speech normal.               1. Pneumonia due to infectious organism, unspecified laterality, unspecified part of lung -advised to continue treatment as ordered -follow up with PCP as scheduled  -precautions reviewed for return  to ED or UC I.e worsening chest pain, spike in temp, or shortness of breath -she verbalized understanding     Time:   Today, I have spent 10 minutes with the patient with telehealth technology discussing the above problems, reviewing the chart, previous notes, medications and orders.    Tests Ordered: No orders of the defined types were placed in this encounter.   Medication Changes: No orders of the defined types were placed in this encounter.    Disposition:  Follow up  w pcp as scheduled  Signed, Amanda Mayo, NP  02/02/2021 10:34 AM

## 2021-02-02 NOTE — Patient Instructions (Signed)
I appreciate the opportunity to provide you with care for your health and wellness.  Follow up: w pcp as scheduled   I hope you feel better soon.  Continue your medications, hydrate with water, and rest.  I am sorry to hear about your father and your furbaby. Hoping you find peace in your memories of them both.   Please continue to practice social distancing to keep you, your family, and our community safe.  If you must go out, please wear a mask and practice good handwashing.

## 2021-02-03 ENCOUNTER — Encounter: Payer: Self-pay | Admitting: Family Medicine

## 2021-02-03 ENCOUNTER — Other Ambulatory Visit: Payer: Self-pay

## 2021-02-03 ENCOUNTER — Telehealth (INDEPENDENT_AMBULATORY_CARE_PROVIDER_SITE_OTHER): Payer: Medicare Other | Admitting: Family Medicine

## 2021-02-03 VITALS — Temp 99.5°F | Ht 67.0 in | Wt 260.0 lb

## 2021-02-03 DIAGNOSIS — J189 Pneumonia, unspecified organism: Secondary | ICD-10-CM

## 2021-02-03 MED ORDER — PREDNISONE 20 MG PO TABS: ORAL_TABLET | ORAL | 0 refills | Status: DC

## 2021-02-03 NOTE — Progress Notes (Signed)
Subjective:    Patient ID: Amanda Webb, female    DOB: 10/10/1971, 50 y.o.   MRN: [id number]  Amanda Webb is a 50 y.o. female presenting on 02/03/2021 for Cough, Nasal Congestion (/), and Pneumonia ([facility] and was diagnosed with [facility] this past Monday. )  Virtual / Telehealth Encounter - Video Visit via MyChart The purpose of this virtual visit is to provide medical care while limiting exposure to the novel coronavirus (COVID19) for both patient and office staff.  Consent was obtained for remote visit:  Yes.   Answered questions that patient had about telehealth interaction:  Yes.   I discussed the limitations, risks, security and privacy concerns of performing an evaluation and management service by video/telephone. I also discussed with the patient that there may be a patient responsible charge related to this service. The patient expressed understanding and agreed to proceed.  Patient Location: Home Provider Location: [facility] (Office)  Participants in virtual visit: - Patient: Allen: Orinda Kenner, CMA - Provider: Dr Parks Ranger   HPI   Atypical Pneumonia [facility] 01/30/21, diagnosed, CXR see below Treated with Amoxicillin 1g TID 5 days / Azithromycin 5 day course and Prednisone 5 day Cough syrup, has previous promethazine still Failed tessalon in past  Today reports chest still hurting from coughing. Still feels like crud Some productive cough Afebrile Admits tight and wheezy at times Using inhalers breathing treatment.   Depression screen [facility] 2/9 11/08/2020 10/24/2020 12/30/2019  Decreased Interest 1 0 0  Down, Depressed, Hopeless 1 0 0  PHQ - 2 Score 2 0 0  Altered sleeping 3 0 -  Tired, decreased energy 2 0 -  Change in appetite 1 0 -  Feeling bad or failure about yourself  1 0 -  Trouble concentrating 0 0 -  Moving slowly or fidgety/restless 0 0 -  Suicidal thoughts 0 0 -  PHQ-9 Score 9 0  -  Difficult doing work/chores Somewhat difficult Not difficult at all -  Some recent data might be hidden    Social History   Tobacco Use  . Smoking status: Current Some Day Smoker    Packs/day: 1.50    Years: 32.00    Pack years: 48.00    Types: Cigarettes  . Smokeless tobacco: Current User  . Tobacco comment: 1 ppd/ 11/22/2020  Vaping Use  . Vaping Use: Never used  Substance Use Topics  . Alcohol use: Yes    Comment: wine occ  . Drug use: No    Review of Systems Per HPI unless specifically indicated above     Objective:    Temp 99.5 F (37.5 C) (Oral)   Ht 5\' 7"  (1.702 m)   Wt 260 lb (117.9 kg)   BMI 40.72 kg/m   Wt Readings from Last 3 Encounters:  02/03/21 260 lb (117.9 kg)  01/06/21 260 lb 5 oz (118.1 kg)  11/22/20 267 lb 6.4 oz (121.3 kg)    Physical Exam   Note examination was completely remotely via video observation objective data only  Gen - well-appearing, no acute distress or apparent pain, comfortable HEENT - eyes appear clear without discharge or redness Heart/Lungs - cannot examine virtually - observed some productive cough. Speaks full sentences. Abd - cannot examine virtually  Skin - face visible today- no rash Neuro - awake, alert, oriented Psych - not anxious appearing   I have personally reviewed the radiology report from 01/30/21  CXR.  Procedure Note  Reading, Radiology - 01/30/2021  Formatting of this note might be different from the original.  Examination  Description: Chest xray, frontal and lateral views   Comparisons:  None provided.    Findings  The cardiomediastinal silhouette is within normal limits.   Prominence of the interstitial markings suggestive of atypical infection per there are. Scattered faint small patchy airspace opacities questions possible pneumonia.   No pleural effusions are seen.   The soft tissue and osseous structures appear unremarkable.   IMPRESSION:   Prominence of the interstitial  markings suggestive of atypical infection per there are. Scattered faint small patchy airspace opacities questions possible pneumonia.   Electronically signed by: Louisa Second, MD on 01/30/2021 17:59:33 Exam End: -   Specimen Collected: 01/30/21 5:59 PM Last Resulted: 01/30/21 5:59 PM  Received From: [facility]  Result Received: 01/31/21 2:54 PM     Results for orders placed or performed in visit on 12/01/20  Folate  Result Value Ref Range   Folate 19.8 >5.9 ng/mL  Ferritin  Result Value Ref Range   Ferritin 76 11 - 307 ng/mL  CBC with Differential  Result Value Ref Range   WBC 10.2 4.0 - 10.5 K/uL   RBC 4.73 3.87 - 5.11 MIL/uL   Hemoglobin 14.7 12.0 - 15.0 g/dL   HCT 43.3 36.0 - 46.0 %   MCV 91.5 80.0 - 100.0 fL   MCH 31.1 26.0 - 34.0 pg   MCHC 33.9 30.0 - 36.0 g/dL   RDW 12.8 11.5 - 15.5 %   Platelets 266 150 - 400 K/uL   nRBC 0.0 0.0 - 0.2 %   Neutrophils Relative % 58 %   Neutro Abs 5.9 1.7 - 7.7 K/uL   Lymphocytes Relative 33 %   Lymphs Abs 3.4 0.7 - 4.0 K/uL   Monocytes Relative 6 %   Monocytes Absolute 0.6 0.1 - 1.0 K/uL   Eosinophils Relative 2 %   Eosinophils Absolute 0.2 0.0 - 0.5 K/uL   Basophils Relative 0 %   Basophils Absolute 0.0 0.0 - 0.1 K/uL   Immature Granulocytes 1 %   Abs Immature Granulocytes 0.06 0.00 - 0.07 K/uL      Assessment & Plan:   Problem List Items Addressed This Visit   None   Visit Diagnoses    Atypical pneumonia    -  Primary   Relevant Medications   azithromycin (ZITHROMAX) 250 MG tablet   predniSONE (DELTASONE) 20 MG tablet     Clinically improved but still persistent cough productive Afebrile Recently seen at [facility], see X-ray report above Negative COVID Already treated with dual coverage antibiotic Amox / Azithromycin, steroid, cough syrup Advised patient that lingering symptoms are common now 4 days after illness diagnosed, may take longer to resolve. Recommend extending prednisone course, new rx 7 day  taper ordered, finish current antibiotics for now. Follow-up as scheduled 02/08/21   Meds ordered this encounter  Medications  . predniSONE (DELTASONE) 20 MG tablet    Sig: Take daily with food. Start with 60mg  (3 pills) x 2 days, then reduce to 40mg  (2 pills) x 2 days, then 20mg  (1 pill) x 3 days    Dispense:  13 tablet    Refill:  0     Follow up plan: Return if symptoms worsen or fail to improve, for as scheduled 02/08/21.   [name], [facility] 02/03/2021, 11:56 AM

## 2021-02-06 ENCOUNTER — Telehealth: Payer: Medicare Other | Admitting: Oncology

## 2021-02-06 DIAGNOSIS — J189 Pneumonia, unspecified organism: Secondary | ICD-10-CM | POA: Diagnosis not present

## 2021-02-06 MED ORDER — LEVOFLOXACIN 500 MG PO TABS: 500.0000 mg | ORAL_TABLET | Freq: Every day | ORAL | 0 refills | Status: DC

## 2021-02-06 NOTE — Progress Notes (Signed)
[name], Amanda Webb are scheduled for a virtual visit with your provider today.    Just as we do with appointments in the office, we must obtain your consent to participate.  Your consent will be active for this visit and any virtual visit you may have with one of our providers in the next 365 days.    If you have a MyChart account, I can also send a copy of this consent to you electronically.  All virtual visits are billed to your insurance company just like a traditional visit in the office.  As this is a virtual visit, video technology does not allow for your provider to perform a traditional examination.  This may limit your provider's ability to fully assess your condition.  If your provider identifies any concerns that need to be evaluated in person or the need to arrange testing such as labs, EKG, etc, we will make arrangements to do so.    Although advances in technology are sophisticated, we cannot ensure that it will always work on either your end or our end.  If the connection with a video visit is poor, we may have to switch to a telephone visit.  With either a video or telephone visit, we are not always able to ensure that we have a secure connection.   I need to obtain your verbal consent now.   Are you willing to proceed with your visit today?   Amanda Webb has provided verbal consent on 02/07/2021 for a virtual visit (video or telephone).   Jacquelin Hawking, NP 02/06/2021  5:19 PM   Virtual Visit via Video Note  I connected with Amanda Webb on 02/06/21 at  5:15 PM EST by a video enabled telemedicine application and verified that I am speaking with the correct person using two identifiers.  Location: Patient: Home Provider: Home Office    I discussed the limitations of evaluation and management by telemedicine and the availability of in person appointments. The patient expressed understanding and agreed to proceed.  History of Present Illness: Amanda Webb is a  50 year old female with past medical history significant for hypertension, chronic venous insufficiency, chronic bronchitis, recurrent sinusitis, hypothyroidism, multiple thyroid nodules, carpal tunnel, history of heart palpitations, prediabetes, anxiety and depression chronic low back pain, morbid obesity, iron deficiency anemia, B12 deficiency, laparoscopic cholecystectomy presents today for pneumonia.  Per chart review, she was seen by [facility]CP and [facility] and was diagnosed with pneumonia on 01/30/2021 by x-ray.  She was started on antibiotics with dual coverage amoxicillin/azithromycin, steroid taper and cough medicine.  Symptoms appear to be lingering and prednisone course was extended 7 additional days.  She has scheduled follow-up with Dr. Parks Ranger on 02/08/2021.  Today, patient presents for worsening cough, chest congestion and fever.  States antibiotics ended about 2 days ago. She felt she was improving but have started to feel worse.  Noted low-grade temperatures over the past 2 days.  T-max 99.8 this morning.  She has been taking Tylenol. Cough is unrelieved with cough medicine.  She is coughing up yellowish/maroon-colored sputum.  Feels more congested although denies shortness of breath.  She is taking steroids per Dr. Parks Ranger.  She is worried given her past medical history that she did not have a long enough course of antibiotics.  She denies shortness of breath or chest pain.  Denies wheezing.   Observations/Objective: Review of Systems  Constitutional: Positive for chills, fever and malaise/fatigue.  HENT: Positive for congestion.   Respiratory: Positive  for cough and sputum production.    Physical Exam Constitutional:      Appearance: Normal appearance.  Pulmonary:     Effort: No respiratory distress.  Neurological:     Mental Status: She is alert and oriented to person, place, and time.    Assessment and Plan:  Community-acquired pneumonia: -Diagnosed on [facility]/30/2021 at [facility]  via chest x-ray.  Tested negative for COVID-19. -Was started on dual antibiotic therapy with amoxicillin/azithromycin and prednisone. -Prednisone extended for 7 additional days. -Noted worsening symptoms 2 days after completing antibiotics (low grade temp, cough, congestion and sputum production). -Consistent with rebound symptoms-per up to date recommend respiratory flouroquinolone.  -Rx Levaquin 500 mg BID X 7 days.  -Continue steroids per PCP.  -We reviewed side effects to Levaquin.  Patient education information sent via [facility].  Follow Up Instructions: -Keep follow-up with PCP as scheduled for 02/08/2021. -If symptoms worsen or fail to improve patient to be seen in urgent care, ED or call PCP.   I discussed the assessment and treatment plan with the patient. The patient was provided an opportunity to ask questions and all were answered. The patient agreed with the plan and demonstrated an understanding of the instructions.   The patient was advised to call back or seek an in-person evaluation if the symptoms worsen or if the condition fails to improve as anticipated.  I provided 13 minutes of face-to-face time during this encounter.   Jacquelin Hawking, NP

## 2021-02-07 ENCOUNTER — Inpatient Hospital Stay: Payer: Medicare Other | Attending: Hematology and Oncology

## 2021-02-07 ENCOUNTER — Telehealth: Payer: Self-pay

## 2021-02-07 NOTE — Telephone Encounter (Signed)
Ok thanks. I am glad she was evaluated last night.  Regarding fever, I would reschedule if running fever, BUT also, if she is not feeling well yet and still recovering, I typically prefer to not do a routine checkup - I would give her at least 1 more week if she is okay to re-schedule the apt when feeling better, it would be more useful to her that way.  [name], [facility] 02/07/2021, 9:23 AM

## 2021-02-07 NOTE — Patient Instructions (Signed)
Levofloxacin tablets What is this medicine? LEVOFLOXACIN (lee voe FLOX a sin) is a quinolone antibiotic. It is used to treat certain kinds of bacterial infections. It will not work for colds, flu, or other viral infections. This medicine may be used for other purposes; ask your health care provider or pharmacist if you have questions. COMMON BRAND NAME(S): Levaquin, Levaquin Leva-Pak What should I tell my health care provider before I take this medicine? They need to know if you have any of these conditions:  bone problems  diabetes  heart disease  high blood pressure  history of irregular heartbeat  history of low levels of potassium in the blood  joint problems  kidney disease  liver disease  mental illness  myasthenia gravis  seizures  tendon problems  tingling of the fingers or toes, or other nerve disorder  an unusual or allergic reaction to levofloxacin, other quinolone antibiotics, foods, dyes, or preservatives  pregnant or trying to get pregnant  breast-feeding How should I use this medicine? Take this medicine by mouth with a full glass of water. Follow the directions on the prescription label. You can take it with or without food. If it upsets your stomach, take it with food. Take your medicine at regular intervals. Do not take your medicine more often than directed. Take all of your medicine as directed even if you think you are better. Do not skip doses or stop your medicine early. Avoid antacids, calcium, iron, and zinc products for 2 hours before and 2 hours after taking a dose of this medicine. A special MedGuide will be given to you by the pharmacist with each prescription and refill. Be sure to read this information carefully each time. Talk to your pediatrician regarding the use of this medicine in children. While this drug may be prescribed for children as young as 6 months for selected conditions, precautions do apply. Overdosage: If you think you  have taken too much of this medicine contact a poison control center or emergency room at once. NOTE: This medicine is only for you. Do not share this medicine with others. What if I miss a dose? If you miss a dose, take it as soon as you remember. If it is almost time for your next dose, take only that dose. Do not take double or extra doses. What may interact with this medicine? Do not take this medicine with any of the following medications:  cisapride  dronedarone  pimozide  thioridazine This medicine may also interact with the following medications:  antacids  birth control pills  certain medicines for diabetes, like glipizide, glyburide, or insulin  certain medicines that treat or prevent blood clots like warfarin  didanosine buffered tablets or powder  multivitamins  NSAIDS, medicines for pain and inflammation, like ibuprofen or naproxen  other medicines that prolong the QT interval (cause an abnormal heart rhythm) like dofetilide, ziprasidone  steroid medicines like prednisone or cortisone  sucralfate  theophylline This list may not describe all possible interactions. Give your health care provider a list of all the medicines, herbs, non-prescription drugs, or dietary supplements you use. Also tell them if you smoke, drink alcohol, or use illegal drugs. Some items may interact with your medicine. What should I watch for while using this medicine? Tell your doctor or healthcare professional if your symptoms do not start to get better or if they get worse. Do not treat diarrhea with over the counter products. Contact your doctor if you have diarrhea that lasts more  than 2 days or if it is severe and watery. Check with your doctor or health care professional if you get an attack of severe diarrhea, nausea and vomiting, or if you sweat a lot. The loss of too much body fluid can make it dangerous for you to take this medicine. This medicine may increase blood sugar. Ask  your healthcare provider if changes in diet or medicines are needed if you have diabetes. You may get drowsy or dizzy. Do not drive, use machinery, or do anything that needs mental alertness until you know how this medicine affects you. Do not sit or stand up quickly, especially if you are an older patient. This reduces the risk of dizzy or fainting spells. This medicine can make you more sensitive to the sun. Keep out of the sun. If you cannot avoid being in the sun, wear protective clothing and use sunscreen. Do not use sun lamps or tanning beds/booths. What side effects may I notice from receiving this medicine? Side effects that you should report to your doctor or health care professional as soon as possible:  allergic reactions like skin rash or hives, swelling of the face, lips, or tongue  anxious  bloody or watery diarrhea  breathing problems  confusion  depressed mood  fast, irregular heartbeat  fever  hallucination, loss of contact with reality  joint, muscle, or tendon pain or swelling  loss of memory  muscle weakness  pain, tingling, numbness in the hands or feet  seizures  signs and symptoms of aortic dissection such as sudden chest, stomach, or back pain  signs and symptoms of high blood sugar such as being more thirsty or hungry or having to urinate more than normal. You may also feel very tired or have blurry vision.  signs and symptoms of liver injury like dark yellow or brown urine; general ill feeling or flu-like symptoms; light-colored stools; loss of appetite; nausea; right upper belly pain; unusually weak or tired; yellowing of the eyes or skin  signs and symptoms of low blood sugar such as feeling anxious; confusion; dizziness; increased hunger; unusually weak or tired; sweating; shakiness; cold; irritable; headache; blurred vision; fast heartbeat; loss of consciousness; pale skin  suicidal thoughts or other mood changes  sunburn  unusually  weak Side effects that usually do not require medical attention (report to your doctor or health care professional if they continue or are bothersome):  constipation  dry mouth  headache  nausea, vomiting  trouble sleeping This list may not describe all possible side effects. Call your doctor for medical advice about side effects. You may report side effects to FDA at 1-800-FDA-1088. Where should I keep my medicine? Keep out of the reach of children. Store at room temperature between 15 and 30 degrees C (59 and 86 degrees F). Keep in a tightly closed container. Throw away any unused medicine after the expiration date. NOTE: This sheet is a summary. It may not cover all possible information. If you have questions about this medicine, talk to your doctor, pharmacist, or health care provider.  2021 Elsevier/Gold Standard (2018-11-24 14:03:14)

## 2021-02-07 NOTE — Telephone Encounter (Signed)
I just spoke with the patient and she said she had a video visit last night and was put on Levaquin and has since started taking it.  She said she is not currently running a fever but still feels fatigued.   I told her to keep me updated on her symptoms and how she is feeling later in the week and that we may schedule an appt to follow up next week. She agreed.

## 2021-02-07 NOTE — Telephone Encounter (Signed)
Copied from [place] [id number]. Topic: General - Inquiry >> Feb 06, 2021  5:06 PM Greggory Keen D wrote: Reason for CRM: Pt called sayinf sh has finished the antibiotics but is still sick.  She is taking the steroids.  She has a fever of 100 today.  Please advise  [phone]

## 2021-02-07 NOTE — Telephone Encounter (Signed)
Patient is scheduled for tomorrow 2/23 for a routine "non sick" visit, if she is still running a fever, I am concerned that we would need to try to treat her with something else at this point.  She could either have a bacterial pneumonia that doesn't seem to have resolved from the initial treatment.  Or she may have an atypical or viral pneumonia that will not go away with antibiotics and will take time.  I would offer her levaquin antibiotic as next option for pneumonia.  Last video visit with me was 2/18, so it has not even been 7 days yet.  Please notify her that I would recommend taking Levaquin 500mg  daily x 7 days, and she can re-schedule her apt from 2/23 (because of fever) to 1 week from now and we can see her in person.  Also, we can do an X-ray next week when we see her as well.  Let me know and I can order the levaquin.  [name], [facility] 02/07/2021, 8:20 AM

## 2021-02-08 ENCOUNTER — Ambulatory Visit: Payer: Medicare Other | Admitting: Family Medicine

## 2021-02-13 DIAGNOSIS — B379 Candidiasis, unspecified: Secondary | ICD-10-CM

## 2021-02-13 MED ORDER — FLUCONAZOLE 150 MG PO TABS: ORAL_TABLET | ORAL | 1 refills | Status: DC

## 2021-02-16 ENCOUNTER — Ambulatory Visit
Admission: RE | Admit: 2021-02-16 | Discharge: 2021-02-16 | Disposition: A | Discharge status: Home / Self Care | Payer: Medicare Other | Attending: Family Medicine | Admitting: Family Medicine

## 2021-02-16 ENCOUNTER — Other Ambulatory Visit: Payer: Self-pay

## 2021-02-16 ENCOUNTER — Inpatient Hospital Stay: Payer: Medicare Other | Attending: Hematology and Oncology

## 2021-02-16 ENCOUNTER — Ambulatory Visit (INDEPENDENT_AMBULATORY_CARE_PROVIDER_SITE_OTHER): Payer: Medicare Other | Admitting: Family Medicine

## 2021-02-16 ENCOUNTER — Encounter: Payer: Self-pay | Admitting: Family Medicine

## 2021-02-16 ENCOUNTER — Ambulatory Visit
Admission: RE | Admit: 2021-02-16 | Discharge: 2021-02-16 | Disposition: A | Discharge status: Home / Self Care | Payer: Medicare Other | Source: Ambulatory Visit | Attending: Family Medicine | Admitting: Family Medicine

## 2021-02-16 VITALS — BP 119/69 | HR 75 | Temp 97.8°F | Ht 67.0 in | Wt 259.0 lb

## 2021-02-16 DIAGNOSIS — J189 Pneumonia, unspecified organism: Secondary | ICD-10-CM | POA: Insufficient documentation

## 2021-02-16 DIAGNOSIS — D509 Iron deficiency anemia, unspecified: Secondary | ICD-10-CM

## 2021-02-16 DIAGNOSIS — E538 Deficiency of other specified B group vitamins: Secondary | ICD-10-CM | POA: Insufficient documentation

## 2021-02-16 DIAGNOSIS — R059 Cough, unspecified: Secondary | ICD-10-CM | POA: Diagnosis not present

## 2021-02-16 MED ORDER — CYANOCOBALAMIN 1000 MCG/ML IJ SOLN
1000.0000 ug | Freq: Once | INTRAMUSCULAR | Status: AC
  Administered 2021-02-16: 1000 ug via INTRAMUSCULAR
  Filled 2021-02-16: qty 1

## 2021-02-16 NOTE — Patient Instructions (Addendum)
Thank you for coming to the office today.  Chest X-ray today, may have residual signs of pneumonia still on chest x-ray  Keep on current track  Try to hold a pillow when coughing  Fatigue and lingering cough are okay  Please schedule a Follow-up Appointment to: Return if symptoms worsen or fail to improve.  If you have any other questions or concerns, please feel free to call the office or send a message through [place]. You may also schedule an earlier appointment if necessary.  Additionally, you may be receiving a survey about your experience at our office within a few days to 1 week by e-mail or mail. We value your feedback.  [name], DO [place]

## 2021-02-16 NOTE — Progress Notes (Signed)
Subjective:    Patient ID: Amanda Webb, female    DOB: 1971-01-06, 50 y.o.   MRN: [id number]  Amanda Webb is a 50 y.o. female presenting on 02/16/2021 for Pneumonia and Cough (Continues having some cough on a daily basis but it has improve.)   HPI   Atypical Pneumonia Recent course 01/30/21 - [facility] dx with pneumonia on Chest X-ray, treated with Amoxicillin, Azithromycin, Prednisone for 5 days, cough syrup and promethazine, tessalon perl  2/18 - Video visit with me, we extended prednisone, already finished Amox/Azithromycin.  2/21 - Video visit with [facility] provider, they switched her to Levaquin 500mg  x 7 days, completed 4 days ago now.  Today feels overall better. Still has residual cough. She wanted to follow-up on it closely. Occasionally productive cough is in AM, later limited or not as much cough. Soreness in rib cage bilateral lower with coughing.  She has been using mucinex to help clear out thicker chest congestion.  Denies any fever chills sweats, dyspnea, wheezing nausea vomiting body aches  Depression screen [facility] 2/9 11/08/2020 10/24/2020 12/30/2019  Decreased Interest 1 0 0  Down, Depressed, Hopeless 1 0 0  PHQ - 2 Score 2 0 0  Altered sleeping 3 0 -  Tired, decreased energy 2 0 -  Change in appetite 1 0 -  Feeling bad or failure about yourself  1 0 -  Trouble concentrating 0 0 -  Moving slowly or fidgety/restless 0 0 -  Suicidal thoughts 0 0 -  PHQ-9 Score 9 0 -  Difficult doing work/chores Somewhat difficult Not difficult at all -  Some recent data might be hidden    Social History   Tobacco Use  . Smoking status: Current Some Day Smoker    Packs/day: 1.50    Years: 32.00    Pack years: 48.00    Types: Cigarettes  . Smokeless tobacco: Current User  . Tobacco comment: 1 ppd/ 11/22/2020  Vaping Use  . Vaping Use: Never used  Substance Use Topics  . Alcohol use: Yes    Comment: wine occ  . Drug use: No    Review of Systems Per  HPI unless specifically indicated above     Objective:    BP 119/69 (BP Location: Left Arm, Patient Position: Sitting, Cuff Size: Normal)   Pulse 75   Temp 97.8 F (36.6 C) (Temporal)   Ht 5\' 7"  (1.702 m)   Wt 259 lb (117.5 kg)   SpO2 99%   BMI 40.57 kg/m   Wt Readings from Last 3 Encounters:  02/16/21 259 lb (117.5 kg)  02/03/21 260 lb (117.9 kg)  01/06/21 260 lb 5 oz (118.1 kg)    Physical Exam Vitals and nursing note reviewed.  Constitutional:      General: She is not in acute distress.    Appearance: She is well-developed and well-nourished. She is not diaphoretic.     Comments: Well-appearing, comfortable, cooperative  HENT:     Head: Normocephalic and atraumatic.     Mouth/Throat:     Mouth: Oropharynx is clear and moist.  Eyes:     General:        Right eye: No discharge.        Left eye: No discharge.     Conjunctiva/sclera: Conjunctivae normal.  Neck:     Thyroid: No thyromegaly.  Cardiovascular:     Rate and Rhythm: Normal rate and regular rhythm.     Pulses: Intact distal pulses.  Heart sounds: Normal heart sounds. No murmur heard.   Pulmonary:     Effort: Pulmonary effort is normal. No respiratory distress.     Breath sounds: Normal breath sounds. No wheezing or rales.  Musculoskeletal:        General: No edema. Normal range of motion.     Cervical back: Normal range of motion and neck supple.  Lymphadenopathy:     Cervical: No cervical adenopathy.  Skin:    General: Skin is warm and dry.     Findings: No erythema or rash.  Neurological:     Mental Status: She is alert and oriented to person, place, and time.  Psychiatric:        Mood and Affect: Mood and affect normal.        Behavior: Behavior normal.     Comments: Well groomed, good eye contact, normal speech and thoughts      I have personally reviewed the radiology report from 01/30/21 Chest X-ray.  XR chest 2 views  Anatomical Region Laterality Modality  Chest -- Radiographic  Imaging    Impression Performed by [facility] PACS  Prominence of the interstitial markings suggestive of atypical infection per there are. Scattered faint small patchy airspace opacities questions possible pneumonia.   Electronically signed by: Louisa Second, MD on 01/30/2021 17:59:33  Narrative Performed by [facility] PACS Examination  Description: Chest xray, frontal and lateral views   Comparisons:  None provided.    Findings  The cardiomediastinal silhouette is within normal limits.   Prominence of the interstitial markings suggestive of atypical infection per there are. Scattered faint small patchy airspace opacities questions possible pneumonia.   No pleural effusions are seen.   The soft tissue and osseous structures appear unremarkable.  Procedure Note  Reading, Radiology - 01/30/2021  Formatting of this note might be different from the original.  Examination  Description: Chest xray, frontal and lateral views   Comparisons:  None provided.    Findings  The cardiomediastinal silhouette is within normal limits.   Prominence of the interstitial markings suggestive of atypical infection per there are. Scattered faint small patchy airspace opacities questions possible pneumonia.   No pleural effusions are seen.   The soft tissue and osseous structures appear unremarkable.   IMPRESSION:   Prominence of the interstitial markings suggestive of atypical infection per there are. Scattered faint small patchy airspace opacities questions possible pneumonia.   Electronically signed by: Louisa Second, MD on 01/30/2021 17:59:33 Exam End: --   Specimen Collected: 01/30/21 5:59 PM Last Resulted: 01/30/21 5:59 PM  Received From: [facility]  Result Received: 01/31/21 2:54 PM      Results for orders placed or performed in visit on 12/01/20  Folate  Result Value Ref Range   Folate 19.8 >5.9 ng/mL  Ferritin  Result Value Ref Range   Ferritin 76 11 - 307 ng/mL  CBC  with Differential  Result Value Ref Range   WBC 10.2 4.0 - 10.5 K/uL   RBC 4.73 3.87 - 5.11 MIL/uL   Hemoglobin 14.7 12.0 - 15.0 g/dL   HCT 43.3 36.0 - 46.0 %   MCV 91.5 80.0 - 100.0 fL   MCH 31.1 26.0 - 34.0 pg   MCHC 33.9 30.0 - 36.0 g/dL   RDW 12.8 11.5 - 15.5 %   Platelets 266 150 - 400 K/uL   nRBC 0.0 0.0 - 0.2 %   Neutrophils Relative % 58 %   Neutro Abs 5.9 1.7 - 7.7 K/uL   Lymphocytes  Relative 33 %   Lymphs Abs 3.4 0.7 - 4.0 K/uL   Monocytes Relative 6 %   Monocytes Absolute 0.6 0.1 - 1.0 K/uL   Eosinophils Relative 2 %   Eosinophils Absolute 0.2 0.0 - 0.5 K/uL   Basophils Relative 0 %   Basophils Absolute 0.0 0.0 - 0.1 K/uL   Immature Granulocytes 1 %   Abs Immature Granulocytes 0.06 0.00 - 0.07 K/uL      Assessment & Plan:   Problem List Items Addressed This Visit   None   Visit Diagnoses    Atypical pneumonia    -  Primary   Relevant Orders   DG Chest 2 View (Completed)      Resolving Atypical Pneumonia Reviewed since 2/14 prior CXR report and course at this point seems to have resolved w/ multiple antibiotics, prednisone therapy essentially maximum therapy outpatient.  Counseling today no further antibiotics or prednisone recommended Should continue mucinex, use nasal spray, continue to monitor course  We will check STAT CXR today for follow-up now 2.5 weeks later, see result above, resolved no acute pneumonia or infiltrate on imaging.  Follow-up as needed.  No orders of the defined types were placed in this encounter.     Follow up plan: Return if symptoms worsen or fail to improve.  [name], [facility] 02/16/2021, 9:43 AM

## 2021-02-23 DIAGNOSIS — F4310 Post-traumatic stress disorder, unspecified: Secondary | ICD-10-CM | POA: Diagnosis not present

## 2021-03-01 ENCOUNTER — Other Ambulatory Visit: Payer: Self-pay

## 2021-03-01 ENCOUNTER — Encounter: Payer: Self-pay | Admitting: Pulmonary Disease

## 2021-03-01 ENCOUNTER — Ambulatory Visit (INDEPENDENT_AMBULATORY_CARE_PROVIDER_SITE_OTHER): Payer: Medicare Other | Admitting: Pulmonary Disease

## 2021-03-01 VITALS — BP 120/74 | HR 75 | Temp 97.3°F | Ht 67.0 in | Wt 264.4 lb

## 2021-03-01 DIAGNOSIS — Z6841 Body Mass Index (BMI) 40.0 and over, adult: Secondary | ICD-10-CM | POA: Diagnosis not present

## 2021-03-01 DIAGNOSIS — J449 Chronic obstructive pulmonary disease, unspecified: Secondary | ICD-10-CM

## 2021-03-01 DIAGNOSIS — F17210 Nicotine dependence, cigarettes, uncomplicated: Secondary | ICD-10-CM

## 2021-03-01 DIAGNOSIS — R059 Cough, unspecified: Secondary | ICD-10-CM | POA: Diagnosis not present

## 2021-03-01 DIAGNOSIS — J4489 Other specified chronic obstructive pulmonary disease: Secondary | ICD-10-CM

## 2021-03-01 MED ORDER — TRELEGY ELLIPTA 100-62.5-25 MCG/INH IN AEPB: 1.0000 | INHALATION_SPRAY | Freq: Every day | RESPIRATORY_TRACT | 0 refills | Status: AC

## 2021-03-01 NOTE — Progress Notes (Signed)
Subjective:    Patient ID: Amanda Webb, female    DOB: Sep 10, 1971, 50 y.o.   MRN: [id number]  Chief Complaint  Patient presents with  . Follow-up    Recently dx with PNA-- c/o prod cough with brown to yellow sputum, wheezing and sob with exertion.     HPI Patient is a 50 year old current smoker (1 PPD) tenths for follow-up on the issue of asthma/COPD overlap and dyspnea on exertion.  This is a scheduled visit.  She was seen at [facility] on 14 February and told she had pneumonia.  She was treated at that time with antibiotics Azithromycin) and a prednisone taper.  She was also given amoxicillin.  She has remained short of breath since then.  Notices persistent issues with wheezing.  Unfortunately continues to smoke 1 pack of cigarettes per day.  She has not had any fevers, chills or sweats since her visit at [facility].  She voices no other complaint today.   Review of Systems A 10 point review of systems was performed and it is as noted above otherwise negative.  Patient Active Problem List   Diagnosis Date Noted  . Major depressive disorder, recurrent, in partial remission ([place]) 11/08/2020  . Bronchitis 10/21/2020  . Cough 10/21/2020  . Acute non-recurrent sinusitis 08/24/2020  . Status post laparoscopic cholecystectomy 07/28/2020  . Morbid obesity ([place]) 07/05/2020  . Hepatic steatosis 06/23/2020  . Lymphedema 03/09/2020  . Chronic venous insufficiency 03/09/2020  . Bilateral lower extremity edema 02/16/2020  . Female cystocele 10/21/2018  . Urinary incontinence, mixed 10/21/2018  . B12 deficiency 04/08/2018  . Iron deficiency anemia 03/14/2018  . GAD (generalized anxiety disorder) 03/10/2018  . Acute right-sided low back pain 10/28/2017  . Tobacco abuse 05/09/2017  . Multiple thyroid nodules 12/28/2016  . Simple chronic bronchitis ([place]) 12/13/2016  . BMI 40.0-44.9, adult ([place]) 09/26/2016  . HLD (hyperlipidemia) 09/26/2016  . Vitamin D deficiency 09/26/2016  .  Pre-diabetes 06/17/2015  . Candidal intertrigo 02/21/2015  . Candidiasis of skin and nail 02/21/2015  . Cellulitis and abscess of leg 02/18/2015  . Benign cyst of right kidney 01/17/2015  . Wheezing 01/11/2015  . Chronic sinusitis 11/15/2014  . Pneumonia due to infectious organism 11/02/2014  . Whiplash injury 09/03/2014  . Heart palpitations 04/02/2014  . Lower urinary tract infectious disease 02/16/2014  . Allergic rhinitis 12/31/2013  . Spondylosis of lumbar region without myelopathy or radiculopathy 12/28/2013  . Diarrhea 11/09/2013  . Chronic low back pain 09/28/2013  . Other fatigue 09/28/2013  . Hypothyroidism 09/28/2013  . Alteration of body temperature 09/28/2013  . Plantar fasciitis, bilateral 07/10/2013  . Tarsal tunnel syndrome of right side 07/10/2013  . Plantar fascial fibromatosis 07/10/2013  . Encounter for surveillance of injectable contraceptive 05/13/2013  . Encounter for Depo-Provera contraception 05/13/2013  . Diuresis excessive 03/24/2013  . Abnormal uterine and vaginal bleeding, unspecified 03/24/2013  . Polyuria 03/24/2013  . Arthritis, multiple joint involvement 03/17/2013  . Fibroblastic disorder 07/28/2012  . Borderline personality disorder ([place]) 12/17/2010  . Essential hypertension 02/18/2010  . Carpal tunnel syndrome on both sides 12/17/2006  . Major depressive disorder, single episode 02/21/1998  . Disk prolapse 04/17/1995  . Depression with anxiety 03/18/1995  . OCD (obsessive compulsive disorder) 03/18/1995  . Severe anxiety with panic 03/18/1995   Allergies  Allergen Reactions  . Bee Venom Anaphylaxis, Hives and Swelling    Carries Epi pen.   . Cat Hair Extract Itching, Other (See Comments) and Swelling    Allergic to  trees, nuts, wheat, grass, cats & dogs - itchy watery eyes, swelling. Uses Zyrtec & Flonase & Benadryl if really bad. Used to get allergy shots. Allergic to trees, nuts, wheat, grass, cats & dogs - itchy watery eyes, swelling.  Uses Zyrtec & Flonase & Benadryl if really bad. Used to get allergy shots.  . Dog Epithelium Cough and Shortness Of Breath  . Dog Fennel Cough and Shortness Of Breath  . Dust Mite Extract Cough and Shortness Of Breath  . Tetracyclines & Related Nausea And Vomiting  . Lac Bovis Diarrhea and Nausea And Vomiting  . Lactose Diarrhea and Nausea And Vomiting  . Milk Protein Diarrhea and Nausea And Vomiting  . Tape Other (See Comments) and Rash    Needs to use paper tape. Breaks out with severe rash, pulls skin off when using adhesive.   Current Meds  Medication Sig  . Accu-Chek FastClix Lancets MISC USE TO CHECK SUGAR UP TO TWICE DAILY AS DIRECTED  . ACCU-CHEK GUIDE test strip CHECK BLOOD SUGAR UP TO TWICE DAILY AS DIRECTED  . acetaminophen (TYLENOL) 500 MG tablet Take 1,000 mg by mouth every 6 (six) hours as needed for moderate pain or headache.  . albuterol (VENTOLIN HFA) 108 (90 Base) MCG/ACT inhaler INHALE 1 TO 2 PUFFS BY MOUTH INTO LUNGS EVERY 4 TO 6 HOURS AS NEEDED FOR SHORTNESS OF BREATH  . ARIPiprazole (ABILIFY) 2 MG tablet Take 2 mg by mouth every morning.   Marland Kitchen atenolol (TENORMIN) 50 MG tablet Take 1 tablet (50 mg total) by mouth daily.  Marland Kitchen azelastine (ASTELIN) 0.1 % nasal spray Place 1 spray into both nostrils 2 (two) times daily. Use in each nostril as directed  . Budeson-Glycopyrrol-Formoterol (BREZTRI AEROSPHERE) 160-9-4.8 MCG/ACT AERO Inhale 1 puff into the lungs in the morning and at bedtime.  . Cetirizine HCl 10 MG CAPS Take 10 mg by mouth daily with lunch.   . Cyanocobalamin (B-12 COMPLIANCE INJECTION IJ) Inject 1 Dose as directed every 30 (thirty) days.  . fluticasone (FLONASE) 50 MCG/ACT nasal spray SHAKE LIQUID AND USE 2 SPRAYS IN EACH NOSTRIL EVERY DAY  . fluvoxaMINE (LUVOX) 100 MG tablet Take 150 mg by mouth every morning.   . gabapentin (NEURONTIN) 100 MG capsule Take 200 mg by mouth 3 (three) times daily.   Marland Kitchen ketoconazole (NIZORAL) 2 % shampoo APPLY EXTERNALLY 2 TIMES A WEEK   . lamoTRIgine (LAMICTAL) 200 MG tablet Take 200 mg by mouth every morning.   Marland Kitchen levothyroxine (SYNTHROID) 50 MCG tablet Take 1 tablet (50 mcg total) by mouth daily before breakfast.  . medroxyPROGESTERone (DEPO-PROVERA) 150 MG/ML injection ADMINISTER 1 ML(150 MG) IN THE MUSCLE EVERY 3 MONTHS  . Multiple Vitamin (MULTIVITAMIN WITH MINERALS) TABS tablet Take 1 tablet by mouth daily.  . pantoprazole (PROTONIX) 20 MG tablet TAKE 1 TABLET(20 MG) BY MOUTH DAILY  . tretinoin (RETIN-A) 0.025 % cream APPLY EXTERNALLY TO THE AFFECTED AREA AT BEDTIME AS NEEDED (Patient taking differently: Apply 1 application topically at bedtime as needed (acne).)  . [DISCONTINUED] azithromycin (ZITHROMAX) 250 MG tablet Take 250 mg by mouth daily.  . [DISCONTINUED] Budeson-Glycopyrrol-Formoterol (BREZTRI AEROSPHERE) 160-9-4.8 MCG/ACT AERO Inhale 2 puffs into the lungs in the morning and at bedtime.  . [DISCONTINUED] fluconazole (DIFLUCAN) 150 MG tablet Take one tablet by mouth on Day 1. Repeat dose 2nd tablet on Day 3.  . [DISCONTINUED] levofloxacin (LEVAQUIN) 500 MG tablet Take 1 tablet (500 mg total) by mouth daily.  . [DISCONTINUED] predniSONE (DELTASONE) 20 MG tablet Take daily  with food. Start with 60mg  (3 pills) x 2 days, then reduce to 40mg  (2 pills) x 2 days, then 20mg  (1 pill) x 3 days   Social History   Tobacco Use  . Smoking status: Current Some Day Smoker    Packs/day: 1.50    Years: 32.00    Pack years: 48.00    Types: Cigarettes  . Smokeless tobacco: Current User  . Tobacco comment: 1 ppd/ 11/22/2020  Substance Use Topics  . Alcohol use: Yes    Comment: wine occ       Objective:   Physical Exam BP 120/74 (BP Location: Left Arm, Cuff Size: Normal)   Pulse 75   Temp (!) 97.3 F (36.3 C) (Temporal)   Ht 5\' 7"  (1.702 m)   Wt 264 lb 6.4 oz (119.9 kg)   SpO2 97%   BMI 41.41 kg/m  GENERAL: Obese woman, fully ambulatory, no acute distress. No conversational dyspnea. HEAD: Normocephalic,  atraumatic.  EYES: Pupils equal, round, reactive to light.  No scleral icterus.  MOUTH: Nose/mouth/throat not examined due to masking requirements for COVID 19. NECK: Supple. No thyromegaly. Trachea midline. No JVD.  No adenopathy. PULMONARY: Good air entry bilaterally. Scattered rhonchi , no wheezes.   CARDIOVASCULAR: S1 and S2. Regular rate and rhythm. No rubs, murmurs or gallops heard. ABDOMEN: Obese otherwise benign MUSCULOSKELETAL: No joint deformity, no clubbing, no edema.  NEUROLOGIC: No focal deficit, no gait disturbance, speech is fluent. SKIN: Intact,warm,dry. On limited exam, no rashes. PSYCH: Mood and behavior normal.     Assessment & Plan:     ICD-10-CM   1. Asthma-COPD overlap syndrome ([place])  J44.9 Pulmonary Function Test [facility] Only   Switched to Trelegy Ellipta Discontinue Breztri Reassess with PFTs  2. Cough  R05.9    Likely due to poorly controlled asthma/COPD Aggravated by tobacco use  3. Tobacco dependence due to cigarettes  F17.210    Patient was counseled regards discontinuation of smoking  4. Morbid obesity with BMI of 40.0-44.9, adult ([place])  E66.01    Z68.41    This issue adds complexity to her management Counseled with regards to weight loss   Orders Placed This Encounter  Procedures  . Pulmonary Function Test [facility] Only    Standing Status:   Future    Standing Expiration Date:   03/01/2022    Scheduling Instructions:     3 WEEKS    Order Specific Question:   Full PFT: includes the following: basic spirometry, spirometry pre & post bronchodilator, diffusion capacity (DLCO), lung volumes    Answer:   Full PFT   Meds ordered this encounter  Medications  . Fluticasone-Umeclidin-Vilant (TRELEGY ELLIPTA) 100-62.5-25 MCG/INH AEPB    Sig: Inhale 1 puff into the lungs daily for 1 day.    Dispense:  14 each    Refill:  0    Order Specific Question:   Lot Number?    Answer:   [id number]    Order Specific Question:   Expiration Date?    Answer:   08/17/2022     Order Specific Question:   Manufacturer?    Answer:   GlaxoSmithKline [[id number]]    Order Specific Question:   Quantity    Answer:   2   We will see the patient in follow-up in 2 months time she is to contact us prior to that time should any new difficulties arise.  She has also been advised to let us know if the Trelegy is working for her so we  can call in the prescription for her.  Renold Don, MD [facility]   *This note was dictated using voice recognition software/Dragon.  Despite best efforts to proofread, errors can occur which can change the meaning.  Any change was purely unintentional.

## 2021-03-01 NOTE — Patient Instructions (Signed)
We are going to get breathing tests.  I am switching you to Trelegy Ellipta 1 inhalation daily.  Let us know how you do with that.  DO NOT USE BREZTRI WHILE ON THE TRELEGY.  We will see you in follow-up in 3 to 4 weeks time call sooner should any new difficulties arise.

## 2021-03-02 ENCOUNTER — Inpatient Hospital Stay: Payer: Medicare Other

## 2021-03-06 ENCOUNTER — Telehealth: Payer: Self-pay | Admitting: Pulmonary Disease

## 2021-03-06 NOTE — Telephone Encounter (Signed)
Patient is aware of date/time of covid test prior to PFT.  

## 2021-03-09 ENCOUNTER — Other Ambulatory Visit: Admission: RE | Admit: 2021-03-09 | Payer: Medicare Other | Source: Ambulatory Visit

## 2021-03-10 ENCOUNTER — Ambulatory Visit: Payer: Medicare Other | Attending: Pulmonary Disease

## 2021-03-14 ENCOUNTER — Ambulatory Visit (INDEPENDENT_AMBULATORY_CARE_PROVIDER_SITE_OTHER): Payer: Medicare Other

## 2021-03-14 VITALS — Ht 67.0 in | Wt 260.0 lb

## 2021-03-14 DIAGNOSIS — Z0000 Encounter for general adult medical examination without abnormal findings: Secondary | ICD-10-CM

## 2021-03-14 NOTE — Patient Instructions (Signed)
Amanda Webb , Thank you for taking time to come for your Medicare Wellness Visit. I appreciate your ongoing commitment to your health goals. Please review the following plan we discussed and let me know if I can assist you in the future.   Screening recommendations/referrals: Colonoscopy: decline Mammogram: completed 01/27/2021 Bone Density: n/a Recommended yearly ophthalmology/optometry visit for glaucoma screening and checkup Recommended yearly dental visit for hygiene and checkup  Vaccinations: Influenza vaccine: completed 09/07/2020, due 07/17/2021 Pneumococcal vaccine: completed 10/03/2017 Tdap vaccine: completed 09/16/2013, due 09/17/2023 Shingles vaccine: n/a  Covid-19:  11/02/2020, 03/18/2020, 02/16/2020  Advanced directives: Advance directive discussed with you today.   Conditions/risks identified: smoking  Next appointment: Follow up in one year for your annual wellness visit.   Preventive Care 40-64 Years, Female Preventive care refers to lifestyle choices and visits with your health care provider that can promote health and wellness. What does preventive care include?  A yearly physical exam. This is also called an annual well check.  Dental exams once or twice a year.  Routine eye exams. Ask your health care provider how often you should have your eyes checked.  Personal lifestyle choices, including:  Daily care of your teeth and gums.  Regular physical activity.  Eating a healthy diet.  Avoiding tobacco and drug use.  Limiting alcohol use.  Practicing safe sex.  Taking low-dose aspirin daily starting at age 46.  Taking vitamin and mineral supplements as recommended by your health care provider. What happens during an annual well check? The services and screenings done by your health care provider during your annual well check will depend on your age, overall health, lifestyle risk factors, and family history of disease. Counseling  Your health care provider  may ask you questions about your:  Alcohol use.  Tobacco use.  Drug use.  Emotional well-being.  Home and relationship well-being.  Sexual activity.  Eating habits.  Work and work Statistician.  Method of birth control.  Menstrual cycle.  Pregnancy history. Screening  You may have the following tests or measurements:  Height, weight, and BMI.  Blood pressure.  Lipid and cholesterol levels. These may be checked every 5 years, or more frequently if you are over 30 years old.  Skin check.  Lung cancer screening. You may have this screening every year starting at age 100 if you have a 30-pack-year history of smoking and currently smoke or have quit within the past 15 years.  Fecal occult blood test (FOBT) of the stool. You may have this test every year starting at age 75.  Flexible sigmoidoscopy or colonoscopy. You may have a sigmoidoscopy every 5 years or a colonoscopy every 10 years starting at age 70.  Hepatitis C blood test.  Hepatitis B blood test.  Sexually transmitted disease (STD) testing.  Diabetes screening. This is done by checking your blood sugar (glucose) after you have not eaten for a while (fasting). You may have this done every 1-3 years.  Mammogram. This may be done every 1-2 years. Talk to your health care provider about when you should start having regular mammograms. This may depend on whether you have a family history of breast cancer.  BRCA-related cancer screening. This may be done if you have a family history of breast, ovarian, tubal, or peritoneal cancers.  Pelvic exam and Pap test. This may be done every 3 years starting at age 13. Starting at age 2, this may be done every 5 years if you have a Pap test in combination  with an HPV test.  Bone density scan. This is done to screen for osteoporosis. You may have this scan if you are at high risk for osteoporosis. Discuss your test results, treatment options, and if necessary, the need for more  tests with your health care provider. Vaccines  Your health care provider may recommend certain vaccines, such as:  Influenza vaccine. This is recommended every year.  Tetanus, diphtheria, and acellular pertussis (Tdap, Td) vaccine. You may need a Td booster every 10 years.  Zoster vaccine. You may need this after age 21.  Pneumococcal 13-valent conjugate (PCV13) vaccine. You may need this if you have certain conditions and were not previously vaccinated.  Pneumococcal polysaccharide (PPSV23) vaccine. You may need one or two doses if you smoke cigarettes or if you have certain conditions. Talk to your health care provider about which screenings and vaccines you need and how often you need them. This information is not intended to replace advice given to you by your health care provider. Make sure you discuss any questions you have with your health care provider. Document Released: 12/30/2015 Document Revised: 08/22/2016 Document Reviewed: 10/04/2015 Elsevier Interactive Patient Education  2017 [place] Prevention in the Home Falls can cause injuries. They can happen to people of all ages. There are many things you can do to make your home safe and to help prevent falls. What can I do on the outside of my home?  Regularly fix the edges of walkways and driveways and fix any cracks.  Remove anything that might make you trip as you walk through a door, such as a raised step or threshold.  Trim any bushes or trees on the path to your home.  Use bright outdoor lighting.  Clear any walking paths of anything that might make someone trip, such as rocks or tools.  Regularly check to see if handrails are loose or broken. Make sure that both sides of any steps have handrails.  Any raised decks and porches should have guardrails on the edges.  Have any leaves, snow, or ice cleared regularly.  Use sand or salt on walking paths during winter.  Clean up any spills in your  garage right away. This includes oil or grease spills. What can I do in the bathroom?  Use night lights.  Install grab bars by the toilet and in the tub and shower. Do not use towel bars as grab bars.  Use non-skid mats or decals in the tub or shower.  If you need to sit down in the shower, use a plastic, non-slip stool.  Keep the floor dry. Clean up any water that spills on the floor as soon as it happens.  Remove soap buildup in the tub or shower regularly.  Attach bath mats securely with double-sided non-slip rug tape.  Do not have throw rugs and other things on the floor that can make you trip. What can I do in the bedroom?  Use night lights.  Make sure that you have a light by your bed that is easy to reach.  Do not use any sheets or blankets that are too big for your bed. They should not hang down onto the floor.  Have a firm chair that has side arms. You can use this for support while you get dressed.  Do not have throw rugs and other things on the floor that can make you trip. What can I do in the kitchen?  Clean up any spills right  away.  Avoid walking on wet floors.  Keep items that you use a lot in easy-to-reach places.  If you need to reach something above you, use a strong step stool that has a grab bar.  Keep electrical cords out of the way.  Do not use floor polish or wax that makes floors slippery. If you must use wax, use non-skid floor wax.  Do not have throw rugs and other things on the floor that can make you trip. What can I do with my stairs?  Do not leave any items on the stairs.  Make sure that there are handrails on both sides of the stairs and use them. Fix handrails that are broken or loose. Make sure that handrails are as long as the stairways.  Check any carpeting to make sure that it is firmly attached to the stairs. Fix any carpet that is loose or worn.  Avoid having throw rugs at the top or bottom of the stairs. If you do have throw  rugs, attach them to the floor with carpet tape.  Make sure that you have a light switch at the top of the stairs and the bottom of the stairs. If you do not have them, ask someone to add them for you. What else can I do to help prevent falls?  Wear shoes that:  Do not have high heels.  Have rubber bottoms.  Are comfortable and fit you well.  Are closed at the toe. Do not wear sandals.  If you use a stepladder:  Make sure that it is fully opened. Do not climb a closed stepladder.  Make sure that both sides of the stepladder are locked into place.  Ask someone to hold it for you, if possible.  Clearly mark and make sure that you can see:  Any grab bars or handrails.  First and last steps.  Where the edge of each step is.  Use tools that help you move around (mobility aids) if they are needed. These include:  Canes.  Walkers.  Scooters.  Crutches.  Turn on the lights when you go into a dark area. Replace any light bulbs as soon as they burn out.  Set up your furniture so you have a clear path. Avoid moving your furniture around.  If any of your floors are uneven, fix them.  If there are any pets around you, be aware of where they are.  Review your medicines with your doctor. Some medicines can make you feel dizzy. This can increase your chance of falling. Ask your doctor what other things that you can do to help prevent falls. This information is not intended to replace advice given to you by your health care provider. Make sure you discuss any questions you have with your health care provider. Document Released: 09/29/2009 Document Revised: 05/10/2016 Document Reviewed: 01/07/2015 Elsevier Interactive Patient Education  2017 [employer].

## 2021-03-14 NOTE — Progress Notes (Signed)
I connected with Amanda Webb today by telephone and verified that I am speaking with the correct person using two identifiers. Location patient: home Location provider: work Persons participating in the virtual visit: Amanda Webb, [name] LPN.   I discussed the limitations, risks, security and privacy concerns of performing an evaluation and management service by telephone and the availability of in person appointments. I also discussed with the patient that there may be a patient responsible charge related to this service. The patient expressed understanding and verbally consented to this telephonic visit.    Interactive audio and video telecommunications were attempted between this provider and patient, however failed, due to patient having technical difficulties OR patient did not have access to video capability.  We continued and completed visit with audio only.     Vital signs may be patient reported or missing.  Subjective:   Amanda Webb is a 50 y.o. female who presents for Medicare Annual (Subsequent) preventive examination.  Review of Systems     Cardiac Risk Factors include: obesity (BMI >30kg/m2);sedentary lifestyle;smoking/ tobacco exposure     Objective:    Today's Vitals   03/14/21 1017  Weight: 260 lb (117.9 kg)  Height: 5\' 7"  (1.702 m)   Body mass index is 40.72 kg/m.  Advanced Directives 03/14/2021 07/19/2020 07/15/2020 07/11/2020 06/06/2020 07/07/2019 05/20/2019  Does Patient Have a Medical Advance Directive? No No No No No No No  Would patient like information on creating a medical advance directive? - No - Patient declined No - Patient declined - No - Patient declined - -    Current Medications (verified) Outpatient Encounter Medications as of 03/14/2021  Medication Sig  . Accu-Chek FastClix Lancets MISC USE TO CHECK SUGAR UP TO TWICE DAILY AS DIRECTED  . ACCU-CHEK GUIDE test strip CHECK BLOOD SUGAR UP TO TWICE DAILY AS DIRECTED  . acetaminophen  (TYLENOL) 500 MG tablet Take 1,000 mg by mouth every 6 (six) hours as needed for moderate pain or headache.  . albuterol (VENTOLIN HFA) 108 (90 Base) MCG/ACT inhaler INHALE 1 TO 2 PUFFS BY MOUTH INTO LUNGS EVERY 4 TO 6 HOURS AS NEEDED FOR SHORTNESS OF BREATH  . ARIPiprazole (ABILIFY) 2 MG tablet Take 2 mg by mouth every morning.   Marland Kitchen atenolol (TENORMIN) 50 MG tablet Take 1 tablet (50 mg total) by mouth daily.  Marland Kitchen azelastine (ASTELIN) 0.1 % nasal spray Place 1 spray into both nostrils 2 (two) times daily. Use in each nostril as directed  . Cetirizine HCl 10 MG CAPS Take 10 mg by mouth daily with lunch.   . Cyanocobalamin (B-12 COMPLIANCE INJECTION IJ) Inject 1 Dose as directed every 30 (thirty) days.  . fluticasone (FLONASE) 50 MCG/ACT nasal spray SHAKE LIQUID AND USE 2 SPRAYS IN EACH NOSTRIL EVERY DAY  . fluvoxaMINE (LUVOX) 100 MG tablet Take 150 mg by mouth every morning.   . gabapentin (NEURONTIN) 100 MG capsule Take 200 mg by mouth 3 (three) times daily.   Marland Kitchen ketoconazole (NIZORAL) 2 % shampoo APPLY EXTERNALLY 2 TIMES A WEEK  . lamoTRIgine (LAMICTAL) 200 MG tablet Take 200 mg by mouth every morning.   Marland Kitchen levothyroxine (SYNTHROID) 50 MCG tablet Take 1 tablet (50 mcg total) by mouth daily before breakfast.  . medroxyPROGESTERone (DEPO-PROVERA) 150 MG/ML injection ADMINISTER 1 ML(150 MG) IN THE MUSCLE EVERY 3 MONTHS  . Multiple Vitamin (MULTIVITAMIN WITH MINERALS) TABS tablet Take 1 tablet by mouth daily.  . pantoprazole (PROTONIX) 20 MG tablet TAKE 1 TABLET(20 MG) BY MOUTH  DAILY  . tretinoin (RETIN-A) 0.025 % cream APPLY EXTERNALLY TO THE AFFECTED AREA AT BEDTIME AS NEEDED (Patient taking differently: Apply 1 application topically at bedtime as needed (acne).)  . Budeson-Glycopyrrol-Formoterol (BREZTRI AEROSPHERE) 160-9-4.8 MCG/ACT AERO Inhale 1 puff into the lungs in the morning and at bedtime. (Patient not taking: Reported on 03/14/2021)   No facility-administered encounter medications on file as of  03/14/2021.    Allergies (verified) Bee venom, Cat hair extract, Dog epithelium, Dog fennel, Dust mite extract, Tetracyclines & related, Lac bovis, Lactose, Milk protein, and Tape   History: Past Medical History:  Diagnosis Date  . Allergy   . Anemia   . Anxiety   . Arrhythmia   . Arthritis   . Cervical dysplasia   . Cystitis   . Depression   . GERD (gastroesophageal reflux disease)   . Gross hematuria   . Headache   . Heart murmur    asymptomatic  . History of methicillin resistant staphylococcus aureus (MRSA) 2013  . HLD (hyperlipidemia)   . Hypertension   . Hypothyroid   . Lymphedema   . Palpitations   . Pernicious anemia   . Tobacco abuse    Past Surgical History:  Procedure Laterality Date  . CARPAL TUNNEL RELEASE Bilateral 2011  . FOOT SURGERY Right 2013   Plantar fascia  . ROBOTIC ASSISTED LAPAROSCOPIC CHOLECYSTECTOMY  07/15/2020   Dr Christian Mate  . TONSILLECTOMY  1979   with ear tubes   Family History  Problem Relation Age of Onset  . Depression Mother   . Mental illness Mother   . Alcohol abuse Mother   . Lung cancer Mother   . Cancer Maternal Grandmother   . Diabetes Maternal Grandmother   . Ovarian cancer Maternal Grandmother   . Kidney disease Maternal Grandfather   . Diabetes Father   . Mental illness Father   . Depression Father   . Drug abuse Father   . Depression Paternal Grandfather   . Drug abuse Paternal Grandfather   . Bladder Cancer Neg Hx   . Breast cancer Neg Hx    Social History   Socioeconomic History  . Marital status: Divorced    Spouse name: Not on file  . Number of children: Not on file  . Years of education: Some college  . Highest education level: Some college, no degree  Occupational History  . Occupation: disability   Tobacco Use  . Smoking status: Current Some Day Smoker    Packs/day: 1.00    Years: 32.00    Pack years: 32.00    Types: Cigarettes  . Smokeless tobacco: Current User  . Tobacco comment: 1 ppd/  11/22/2020  Vaping Use  . Vaping Use: Never used  Substance and Sexual Activity  . Alcohol use: Yes    Comment: wine occ  . Drug use: No  . Sexual activity: Not Currently    Birth control/protection: Condom, Injection  Other Topics Concern  . Not on file  Social History Narrative   Lives with mom   Social Determinants of Health   Financial Resource Strain: Low Risk   . Difficulty of Paying Living Expenses: Not hard at all  Food Insecurity: No Food Insecurity  . Worried About Charity fundraiser in the Last Year: Never true  . Ran Out of Food in the Last Year: Never true  Transportation Needs: No Transportation Needs  . Lack of Transportation (Medical): No  . Lack of Transportation (Non-Medical): No  Physical Activity: Inactive  .  Days of Exercise per Week: 0 days  . Minutes of Exercise per Session: 0 min  Stress: Stress Concern Present  . Feeling of Stress : Very much  Social Connections: Not on file    Tobacco Counseling Ready to quit: Yes Counseling given: Not Answered Comment: 1 ppd/ 11/22/2020   Clinical Intake:  Pre-visit preparation completed: Yes  Pain : No/denies pain     Nutritional Status: BMI > 30  Obese Nutritional Risks: None  How often do you need to have someone help you when you read instructions, pamphlets, or other written materials from your doctor or pharmacy?: 1 - Never What is the last grade level you completed in school?: some college  Diabetic? no  Interpreter Needed?: No  Information entered by :: NAllen LPN   Activities of Daily Living In your present state of health, do you have any difficulty performing the following activities: 03/14/2021 07/11/2020  Hearing? N N  Vision? N N  Difficulty concentrating or making decisions? N N  Walking or climbing stairs? N N  Dressing or bathing? N N  Doing errands, shopping? N N  Preparing Food and eating ? N -  Using the Toilet? N -  In the past six months, have you accidently leaked  urine? Y -  Comment wears pads -  Managing your Medications? N -  Managing your Finances? N -  Housekeeping or managing your Housekeeping? N -  Some recent data might be hidden    Patient Care Team: Olin Hauser, DO as PCP - General (Family Medicine) Rockey Situ Kathlene November, MD as Consulting Physician (Cardiology)  Indicate any recent Medical Services you may have received from other than [facility] providers in the past year (date may be approximate).     Assessment:   This is a routine wellness examination for [facility].  Hearing/Vision screen No exam data present  Dietary issues and exercise activities discussed: Current Exercise Habits: The patient does not participate in regular exercise at present  Goals    . Patient Stated     03/14/2021, wants to weigh less than 200 pounds and quit smoking      Depression Screen PHQ 2/9 Scores 03/14/2021 11/08/2020 10/24/2020 12/30/2019 07/07/2019 01/01/2019 10/20/2018  PHQ - 2 Score 5 2 0 0 2 0 0  PHQ- 9 Score 12 9 0 - 11 3 -    Fall Risk Fall Risk  03/14/2021 10/24/2020 07/28/2020 07/20/2020 07/05/2020  Falls in the past year? 0 0 0 0 0  Number falls in past yr: - - 0 0 0  Injury with Fall? - - 0 0 0  Risk for fall due to : Medication side effect - - - -  Follow up Falls evaluation completed;Education provided;Falls prevention discussed Falls evaluation completed - - -    FALL RISK PREVENTION PERTAINING TO THE HOME:  Any stairs in or around the home? Yes  If so, are there any without handrails? No  Home free of loose throw rugs in walkways, pet beds, electrical cords, etc? Yes  Adequate lighting in your home to reduce risk of falls? Yes   ASSISTIVE DEVICES UTILIZED TO PREVENT FALLS:  Life alert? No  Use of a cane, walker or w/c? No  Grab bars in the bathroom? Yes  Shower chair or bench in shower? No  Elevated toilet seat or a handicapped toilet? No   TIMED UP AND GO:  Was the test performed? No .     Cognitive Function:  6CIT Screen 03/14/2021  What Year? 0 points  What month? 0 points  What time? 0 points  Count back from 20 0 points  Months in reverse 0 points  Repeat phrase 2 points  Total Score 2    Immunizations Immunization History  Administered Date(s) Administered  . Influenza, Seasonal, Injecte, Preservative Fre 10/21/2007, 09/01/2011, 10/07/2012  . Influenza,inj,Quad PF,6+ Mos 09/17/2013, 09/26/2016, 10/03/2017, 09/19/2018, 09/11/2019, 09/07/2020  . Influenza-Unspecified 08/18/2014, 09/10/2019  . PFIZER(Purple Top)SARS-COV-2 Vaccination 02/16/2020, 03/18/2020, 11/02/2020  . Pneumococcal Polysaccharide-23 10/03/2017  . Tdap 10/21/2007    TDAP status: Up to date  Flu Vaccine status: Up to date  Pneumococcal vaccine status: Up to date  Covid-19 vaccine status: Completed vaccines  Qualifies for Shingles Vaccine? No   Zostavax completed n/a  Shingrix Completed?: n/a  Screening Tests Health Maintenance  Topic Date Due  . COLONOSCOPY (Pts 45-66yrs Insurance coverage will need to be confirmed)  Never done  . COVID-19 Vaccine (4 - Booster for Pfizer series) 05/02/2021  . TETANUS/TDAP  09/17/2023  . PAP SMEAR-Modifier  09/28/2023  . INFLUENZA VACCINE  Completed  . Hepatitis C Screening  Completed  . HIV Screening  Completed  . HPV VACCINES  Aged Out    Health Maintenance  Health Maintenance Due  Topic Date Due  . COLONOSCOPY (Pts 45-70yrs Insurance coverage will need to be confirmed)  Never done    Colorectal cancer screening: decline  Mammogram status: Completed 01/27/2021. Repeat every year  Bone Density status: n/a  Lung Cancer Screening: (Low Dose CT Chest recommended if Age 77-80 years, 30 pack-year currently smoking OR have quit w/in 15years.) does qualify.   Lung Cancer Screening Referral: CT scan 10/12/2020  Additional Screening:  Hepatitis C Screening: does qualify; Completed 10/21/2018  Vision Screening: Recommended annual ophthalmology exams for early  detection of glaucoma and other disorders of the eye. Is the patient up to date with their annual eye exam?  Yes  Who is the provider or what is the name of the office in which the patient attends annual eye exams? Dr. Ellin Mayhew If pt is not established with a provider, would they like to be referred to a provider to establish care? No .   Dental Screening: Recommended annual dental exams for proper oral hygiene  Community Resource Referral / Chronic Care Management: CRR required this visit?  No   CCM required this visit?  No      Plan:     I have personally reviewed and noted the following in the patient's chart:   . Medical and social history . Use of alcohol, tobacco or illicit drugs  . Current medications and supplements . Functional ability and status . Nutritional status . Physical activity . Advanced directives . List of other physicians . Hospitalizations, surgeries, and ER visits in previous 12 months . Vitals . Screenings to include cognitive, depression, and falls . Referrals and appointments  In addition, I have reviewed and discussed with patient certain preventive protocols, quality metrics, and best practice recommendations. A written personalized care plan for preventive services as well as general preventive health recommendations were provided to patient.     Kellie Simmering, LPN   [id number]   Nurse Notes:

## 2021-03-15 MED ORDER — TRELEGY ELLIPTA 100-62.5-25 MCG/INH IN AEPB: 1.0000 | INHALATION_SPRAY | Freq: Every day | RESPIRATORY_TRACT | 11 refills | Status: AC

## 2021-03-24 ENCOUNTER — Other Ambulatory Visit: Payer: Self-pay

## 2021-03-24 ENCOUNTER — Ambulatory Visit (INDEPENDENT_AMBULATORY_CARE_PROVIDER_SITE_OTHER): Payer: Medicare Other | Admitting: Surgical

## 2021-03-24 DIAGNOSIS — Z3042 Encounter for surveillance of injectable contraceptive: Secondary | ICD-10-CM

## 2021-03-24 MED ORDER — MEDROXYPROGESTERONE ACETATE 150 MG/ML IM SUSP
150.0000 mg | Freq: Once | INTRAMUSCULAR | Status: AC
  Administered 2021-03-24: 150 mg via INTRAMUSCULAR

## 2021-03-24 MED ORDER — MEDROXYPROGESTERONE ACETATE 150 MG/ML IM SUSP: INTRAMUSCULAR | 1 refills | Status: DC

## 2021-03-24 NOTE — Progress Notes (Signed)
Date last pap: 09/27/2020 Last Depo-Provera: 01/06/2021 Side Effects if any: NONE Serum HCG indicated? NA Depo-Provera 150 mg IM given by: [facility] CMA  Next appointment due 06/09/21-06/23/21

## 2021-03-30 ENCOUNTER — Encounter: Payer: Self-pay | Admitting: Obstetrics and Gynecology

## 2021-03-30 ENCOUNTER — Inpatient Hospital Stay: Payer: Medicare Other | Attending: Hematology and Oncology

## 2021-03-30 ENCOUNTER — Other Ambulatory Visit: Payer: Self-pay

## 2021-03-30 ENCOUNTER — Ambulatory Visit (INDEPENDENT_AMBULATORY_CARE_PROVIDER_SITE_OTHER): Payer: Medicare Other | Admitting: Obstetrics and Gynecology

## 2021-03-30 VITALS — BP 136/74 | HR 89 | Ht 67.0 in | Wt 262.3 lb

## 2021-03-30 DIAGNOSIS — N9089 Other specified noninflammatory disorders of vulva and perineum: Secondary | ICD-10-CM | POA: Diagnosis not present

## 2021-03-30 DIAGNOSIS — E538 Deficiency of other specified B group vitamins: Secondary | ICD-10-CM | POA: Diagnosis not present

## 2021-03-30 DIAGNOSIS — L723 Sebaceous cyst: Secondary | ICD-10-CM | POA: Diagnosis not present

## 2021-03-30 DIAGNOSIS — D509 Iron deficiency anemia, unspecified: Secondary | ICD-10-CM

## 2021-03-30 MED ORDER — CYANOCOBALAMIN 1000 MCG/ML IJ SOLN
1000.0000 ug | Freq: Once | INTRAMUSCULAR | Status: AC
  Administered 2021-03-30: 1000 ug via INTRAMUSCULAR
  Filled 2021-03-30: qty 1

## 2021-03-30 NOTE — Progress Notes (Signed)
HPI:      [name] is a 50 y.o. [phone] who LMP was No LMP recorded (lmp unknown). Patient has had an injection.  Subjective:   She presents today presence of a labial "bump" that is been there for several months.  It does not cause her any pain or other difficulties.  She has monitored it during this time and she notes that has not changed in size. Patient has had a remote history of cervical dysplasia. She denies new sexual partners. She does have a history of HSV but states that is nothing like that outbreak.    Hx: The following portions of the patient's history were reviewed and updated as appropriate:             She  has a past medical history of Allergy, Anemia, Anxiety, Arrhythmia, Arthritis, Cervical dysplasia, Cystitis, Depression, GERD (gastroesophageal reflux disease), Gross hematuria, Headache, Heart murmur, History of methicillin resistant staphylococcus aureus (MRSA) (2013), HLD (hyperlipidemia), Hypertension, Hypothyroid, Lymphedema, Palpitations, Pernicious anemia, and Tobacco abuse. She does not have any pertinent problems on file. She  has a past surgical history that includes Foot surgery (Right, 2013); Carpal tunnel release (Bilateral, 2011); Tonsillectomy (1979); and Robotic assisted laparoscopic cholecystectomy (07/15/2020). Her family history includes Alcohol abuse in her mother; Cancer in her maternal grandmother; Depression in her father, mother, and paternal grandfather; Diabetes in her father and maternal grandmother; Drug abuse in her father and paternal grandfather; Kidney disease in her maternal grandfather; Lung cancer in her mother; Mental illness in her father and mother; Ovarian cancer in her maternal grandmother. She  reports that she has been smoking cigarettes. She has a 32.00 pack-year smoking history. She uses smokeless tobacco. She reports current alcohol use. She reports that she does not use drugs. She has a current medication list which  includes the following prescription(s): accu-chek fastclix lancets, accu-chek guide, acetaminophen, albuterol, aripiprazole, atenolol, azelastine, cetirizine hcl, cyanocobalamin, fluticasone, fluvoxamine, gabapentin, ketoconazole, lamotrigine, levothyroxine, medroxyprogesterone, multivitamin with minerals, pantoprazole, and tretinoin. She is allergic to bee venom, cat hair extract, dog epithelium, dog fennel, dust mite extract, tetracyclines & related, lac bovis, lactose, milk protein, and tape.       Review of Systems:  Review of Systems  Constitutional: Denied constitutional symptoms, night sweats, recent illness, fatigue, fever, insomnia and weight loss.  Eyes: Denied eye symptoms, eye pain, photophobia, vision change and visual disturbance.  Ears/Nose/Throat/Neck: Denied ear, nose, throat or neck symptoms, hearing loss, nasal discharge, sinus congestion and sore throat.  Cardiovascular: Denied cardiovascular symptoms, arrhythmia, chest pain/pressure, edema, exercise intolerance, orthopnea and palpitations.  Respiratory: Denied pulmonary symptoms, asthma, pleuritic pain, productive sputum, cough, dyspnea and wheezing.  Gastrointestinal: Denied, gastro-esophageal reflux, melena, nausea and vomiting.  Genitourinary: See HPI for additional information.  Musculoskeletal: Denied musculoskeletal symptoms, stiffness, swelling, muscle weakness and myalgia.  Dermatologic: Denied dermatology symptoms, rash and scar.  Neurologic: Denied neurology symptoms, dizziness, headache, neck pain and syncope.  Psychiatric: Denied psychiatric symptoms, anxiety and depression.  Endocrine: Denied endocrine symptoms including hot flashes and night sweats.   Meds:   Current Outpatient Medications on File Prior to Visit  Medication Sig Dispense Refill  . Accu-Chek FastClix Lancets MISC USE TO CHECK SUGAR UP TO TWICE DAILY AS DIRECTED 102 each 5  . ACCU-CHEK GUIDE test strip CHECK BLOOD SUGAR UP TO TWICE DAILY AS  DIRECTED 200 strip 5  . acetaminophen (TYLENOL) 500 MG tablet Take 1,000 mg by mouth every 6 (six) hours as needed for moderate pain  or headache.    . albuterol (VENTOLIN HFA) 108 (90 Base) MCG/ACT inhaler INHALE 1 TO 2 PUFFS BY MOUTH INTO LUNGS EVERY 4 TO 6 HOURS AS NEEDED FOR SHORTNESS OF BREATH 18 g 2  . ARIPiprazole (ABILIFY) 2 MG tablet Take 2 mg by mouth every morning.     Marland Kitchen atenolol (TENORMIN) 50 MG tablet Take 1 tablet (50 mg total) by mouth daily. 90 tablet 1  . azelastine (ASTELIN) 0.1 % nasal spray Place 1 spray into both nostrils 2 (two) times daily. Use in each nostril as directed 30 mL 5  . Cetirizine HCl 10 MG CAPS Take 10 mg by mouth daily with lunch.     . Cyanocobalamin (B-12 COMPLIANCE INJECTION IJ) Inject 1 Dose as directed every 30 (thirty) days.    . fluticasone (FLONASE) 50 MCG/ACT nasal spray SHAKE LIQUID AND USE 2 SPRAYS IN EACH NOSTRIL EVERY DAY 48 g 1  . fluvoxaMINE (LUVOX) 100 MG tablet Take 150 mg by mouth every morning.     . gabapentin (NEURONTIN) 100 MG capsule Take 200 mg by mouth 3 (three) times daily.     Marland Kitchen ketoconazole (NIZORAL) 2 % shampoo APPLY EXTERNALLY 2 TIMES A WEEK 120 mL 2  . lamoTRIgine (LAMICTAL) 200 MG tablet Take 200 mg by mouth every morning.     Marland Kitchen levothyroxine (SYNTHROID) 50 MCG tablet Take 1 tablet (50 mcg total) by mouth daily before breakfast. 90 tablet 1  . medroxyPROGESTERone (DEPO-PROVERA) 150 MG/ML injection Bring depo injection to the office each visit. 1 mL 1  . Multiple Vitamin (MULTIVITAMIN WITH MINERALS) TABS tablet Take 1 tablet by mouth daily.    . pantoprazole (PROTONIX) 20 MG tablet TAKE 1 TABLET(20 MG) BY MOUTH DAILY 90 tablet 1  . tretinoin (RETIN-A) 0.025 % cream APPLY EXTERNALLY TO THE AFFECTED AREA AT BEDTIME AS NEEDED (Patient taking differently: Apply 1 application topically at bedtime as needed (acne).) 45 g 2   No current facility-administered medications on file prior to visit.          Objective:     Vitals:    03/30/21 1311  BP: 136/74  Pulse: 89   Filed Weights   03/30/21 1311  Weight: 262 lb 4.8 oz (119 kg)              Physical examination   Pelvic:  Vulva: Normal appearance.  Very small firm area noted on left lower labia.  Not erythematous not ulcerated  Vagina: No lesions or abnormalities noted.  Support: Normal pelvic support.  Urethra No masses tenderness or scarring.  Meatus Normal size without lesions or prolapse.  Cervix: Normal appearance.  No lesions.  Anus: Normal exam.  No lesions.  Perineum: Normal exam.  No lesions.     Assessment:    [id number] Patient Active Problem List   Diagnosis Date Noted  . Major depressive disorder, recurrent, in partial remission ([place]) 11/08/2020  . Bronchitis 10/21/2020  . Cough 10/21/2020  . Acute non-recurrent sinusitis 08/24/2020  . Status post laparoscopic cholecystectomy 07/28/2020  . Morbid obesity ([place]) 07/05/2020  . Hepatic steatosis 06/23/2020  . Lymphedema 03/09/2020  . Chronic venous insufficiency 03/09/2020  . Bilateral lower extremity edema 02/16/2020  . Female cystocele 10/21/2018  . Urinary incontinence, mixed 10/21/2018  . B12 deficiency 04/08/2018  . Iron deficiency anemia 03/14/2018  . GAD (generalized anxiety disorder) 03/10/2018  . Acute right-sided low back pain 10/28/2017  . Tobacco abuse 05/09/2017  . Multiple thyroid nodules 12/28/2016  . Simple chronic  bronchitis ([place]) 12/13/2016  . BMI 40.0-44.9, adult ([place]) 09/26/2016  . HLD (hyperlipidemia) 09/26/2016  . Vitamin D deficiency 09/26/2016  . Pre-diabetes 06/17/2015  . Candidal intertrigo 02/21/2015  . Candidiasis of skin and nail 02/21/2015  . Cellulitis and abscess of leg 02/18/2015  . Benign cyst of right kidney 01/17/2015  . Wheezing 01/11/2015  . Chronic sinusitis 11/15/2014  . Pneumonia due to infectious organism 11/02/2014  . Whiplash injury 09/03/2014  . Heart palpitations 04/02/2014  . Lower urinary tract infectious disease 02/16/2014  .  Allergic rhinitis 12/31/2013  . Spondylosis of lumbar region without myelopathy or radiculopathy 12/28/2013  . Diarrhea 11/09/2013  . Chronic low back pain 09/28/2013  . Other fatigue 09/28/2013  . Hypothyroidism 09/28/2013  . Alteration of body temperature 09/28/2013  . Plantar fasciitis, bilateral 07/10/2013  . Tarsal tunnel syndrome of right side 07/10/2013  . Plantar fascial fibromatosis 07/10/2013  . Encounter for surveillance of injectable contraceptive 05/13/2013  . Encounter for Depo-Provera contraception 05/13/2013  . Diuresis excessive 03/24/2013  . Abnormal uterine and vaginal bleeding, unspecified 03/24/2013  . Polyuria 03/24/2013  . Arthritis, multiple joint involvement 03/17/2013  . Fibroblastic disorder 07/28/2012  . Borderline personality disorder ([place]) 12/17/2010  . Essential hypertension 02/18/2010  . Carpal tunnel syndrome on both sides 12/17/2006  . Major depressive disorder, single episode 02/21/1998  . Disk prolapse 04/17/1995  . Depression with anxiety 03/18/1995  . OCD (obsessive compulsive disorder) 03/18/1995  . Severe anxiety with panic 03/18/1995     1. Vulvar lesion   2. Sebaceous cyst     Based on palpation of the labial bump it feels as if there is an inclusion body underneath.  However the external appearance is of a small flat condyloma, by palpation it seems more like a sebaceous cyst.   Plan:            1.  Reassured patient regarding this.  As she is not having any symptoms nothing to do at this time.  Plan reexamination at the time of annual exam.  Orders No orders of the defined types were placed in this encounter.   No orders of the defined types were placed in this encounter.     F/U  Return for Annual Physical. I spent 21 minutes involved in the care of this patient preparing to see the patient by obtaining and reviewing her medical history (including labs, imaging tests and prior procedures), documenting clinical information in  the electronic health record (EHR), counseling and coordinating care plans, writing and sending prescriptions, ordering tests or procedures and directly communicating with the patient by discussing pertinent items from her history and physical exam as well as detailing my assessment and plan as noted above so that she has an informed understanding.  All of her questions were answered.  Finis Bud, M.D. 03/30/2021 1:48 PM

## 2021-04-05 ENCOUNTER — Other Ambulatory Visit: Payer: Self-pay | Admitting: Pulmonary Disease

## 2021-04-06 DIAGNOSIS — F4310 Post-traumatic stress disorder, unspecified: Secondary | ICD-10-CM | POA: Diagnosis not present

## 2021-04-11 ENCOUNTER — Encounter: Payer: Self-pay | Admitting: Pulmonary Disease

## 2021-04-12 ENCOUNTER — Encounter: Payer: Self-pay | Admitting: Nurse Practitioner

## 2021-04-12 ENCOUNTER — Telehealth: Payer: Medicare Other | Admitting: Nurse Practitioner

## 2021-04-12 DIAGNOSIS — J069 Acute upper respiratory infection, unspecified: Secondary | ICD-10-CM

## 2021-04-12 MED ORDER — AZITHROMYCIN 250 MG PO TABS: ORAL_TABLET | ORAL | 0 refills | Status: DC

## 2021-04-12 MED ORDER — CHLORPHEN-PE-ACETAMINOPHEN 4-10-325 MG PO TABS: 1.0000 | ORAL_TABLET | Freq: Four times a day (QID) | ORAL | 0 refills | Status: DC | PRN

## 2021-04-12 MED ORDER — BENZONATATE 100 MG PO CAPS: 100.0000 mg | ORAL_CAPSULE | Freq: Three times a day (TID) | ORAL | 0 refills | Status: DC | PRN

## 2021-04-12 NOTE — Patient Instructions (Signed)

## 2021-04-12 NOTE — Progress Notes (Signed)
Ms. javen, ridings are scheduled for a virtual visit with your provider today.    Just as we do with appointments in the office, we must obtain your consent to participate.  Your consent will be active for this visit and any virtual visit you may have with one of our providers in the next 365 days.    If you have a MyChart account, I can also send a copy of this consent to you electronically.  All virtual visits are billed to your insurance company just like a traditional visit in the office.  As this is a virtual visit, video technology does not allow for your provider to perform a traditional examination.  This may limit your provider's ability to fully assess your condition.  If your provider identifies any concerns that need to be evaluated in person or the need to arrange testing such as labs, EKG, etc, we will make arrangements to do so.    Although advances in technology are sophisticated, we cannot ensure that it will always work on either your end or our end.  If the connection with a video visit is poor, we may have to switch to a telephone visit.  With either a video or telephone visit, we are not always able to ensure that we have a secure connection.   I need to obtain your verbal consent now.   Are you willing to proceed with your visit today?   Shirely Toren has provided verbal consent on 04/12/2021 for a virtual visit (video or telephone).  Virtual Visit via Video  I connected with  [name]  on 04/12/21 at 7{50 by video and then telephone because could not hear patient. and verified that I am speaking with the correct person using two identifiers. Ovidio Hanger Stetler is currently located at home and her  mom is currently with her during visit. The provider, Kyndell-Margaret Hassell Done, FNP is located at home at time of visit.  I discussed the limitations, risks, security and privacy concerns of performing an evaluation and management service by video  and the availability of in  person appointments. I also discussed with the patient that there may be a patient responsible charge related to this service. The patient expressed understanding and agreed to proceed.   Subjective:   HPI:   patient calls in c/o cough and congestion on Saturday. She has been using OTC meds . Today she has developed a fever 100.1 this evening. She has taken iibupriprofen and that has not helped as of yet. Home  covid test negative  Review of Systems  Constitutional: Positive for chills and fever.  HENT: Positive for congestion. Negative for sore throat.   Respiratory: Positive for cough and sputum production.   Musculoskeletal: Negative for myalgias.  Neurological: Positive for headaches.     See pertinent positives and negatives per HPI.  Patient Active Problem List   Diagnosis Date Noted  . Major depressive disorder, recurrent, in partial remission ([place]) 11/08/2020  . Bronchitis 10/21/2020  . Cough 10/21/2020  . Acute non-recurrent sinusitis 08/24/2020  . Status post laparoscopic cholecystectomy 07/28/2020  . Morbid obesity ([place]) 07/05/2020  . Hepatic steatosis 06/23/2020  . Lymphedema 03/09/2020  . Chronic venous insufficiency 03/09/2020  . Bilateral lower extremity edema 02/16/2020  . Female cystocele 10/21/2018  . Urinary incontinence, mixed 10/21/2018  . B12 deficiency 04/08/2018  . Iron deficiency anemia 03/14/2018  . GAD (generalized anxiety disorder) 03/10/2018  . Acute right-sided low back pain 10/28/2017  . Tobacco  abuse 05/09/2017  . Multiple thyroid nodules 12/28/2016  . Simple chronic bronchitis ([place]) 12/13/2016  . BMI 40.0-44.9, adult ([place]) 09/26/2016  . HLD (hyperlipidemia) 09/26/2016  . Vitamin D deficiency 09/26/2016  . Pre-diabetes 06/17/2015  . Candidal intertrigo 02/21/2015  . Candidiasis of skin and nail 02/21/2015  . Cellulitis and abscess of leg 02/18/2015  . Benign cyst of right kidney 01/17/2015  . Wheezing 01/11/2015  . Chronic sinusitis  11/15/2014  . Pneumonia due to infectious organism 11/02/2014  . Whiplash injury 09/03/2014  . Heart palpitations 04/02/2014  . Lower urinary tract infectious disease 02/16/2014  . Allergic rhinitis 12/31/2013  . Spondylosis of lumbar region without myelopathy or radiculopathy 12/28/2013  . Diarrhea 11/09/2013  . Chronic low back pain 09/28/2013  . Other fatigue 09/28/2013  . Hypothyroidism 09/28/2013  . Alteration of body temperature 09/28/2013  . Plantar fasciitis, bilateral 07/10/2013  . Tarsal tunnel syndrome of right side 07/10/2013  . Plantar fascial fibromatosis 07/10/2013  . Encounter for surveillance of injectable contraceptive 05/13/2013  . Encounter for Depo-Provera contraception 05/13/2013  . Diuresis excessive 03/24/2013  . Abnormal uterine and vaginal bleeding, unspecified 03/24/2013  . Polyuria 03/24/2013  . Arthritis, multiple joint involvement 03/17/2013  . Fibroblastic disorder 07/28/2012  . Borderline personality disorder ([place]) 12/17/2010  . Essential hypertension 02/18/2010  . Carpal tunnel syndrome on both sides 12/17/2006  . Major depressive disorder, single episode 02/21/1998  . Disk prolapse 04/17/1995  . Depression with anxiety 03/18/1995  . OCD (obsessive compulsive disorder) 03/18/1995  . Severe anxiety with panic 03/18/1995    Social History   Tobacco Use  . Smoking status: Current Some Day Smoker    Packs/day: 1.00    Years: 32.00    Pack years: 32.00    Types: Cigarettes  . Smokeless tobacco: Current User  . Tobacco comment: 1 ppd/ 11/22/2020  Substance Use Topics  . Alcohol use: Yes    Comment: wine occ    Current Outpatient Medications:  .  Accu-Chek FastClix Lancets MISC, USE TO CHECK SUGAR UP TO TWICE DAILY AS DIRECTED, Disp: 102 each, Rfl: 5 .  ACCU-CHEK GUIDE test strip, CHECK BLOOD SUGAR UP TO TWICE DAILY AS DIRECTED, Disp: 200 strip, Rfl: 5 .  acetaminophen (TYLENOL) 500 MG tablet, Take 1,000 mg by mouth every 6 (six) hours as  needed for moderate pain or headache., Disp: , Rfl:  .  albuterol (VENTOLIN HFA) 108 (90 Base) MCG/ACT inhaler, INHALE 1 TO 2 PUFFS BY MOUTH EVERY 4 TO 6 HOURS AS NEEDED FOR SHORTNESS OF BREATH, Disp: 18 g, Rfl: 2 .  ARIPiprazole (ABILIFY) 2 MG tablet, Take 2 mg by mouth every morning. , Disp: , Rfl:  .  atenolol (TENORMIN) 50 MG tablet, Take 1 tablet (50 mg total) by mouth daily., Disp: 90 tablet, Rfl: 1 .  azelastine (ASTELIN) 0.1 % nasal spray, Place 1 spray into both nostrils 2 (two) times daily. Use in each nostril as directed, Disp: 30 mL, Rfl: 5 .  Cetirizine HCl 10 MG CAPS, Take 10 mg by mouth daily with lunch. , Disp: , Rfl:  .  Cyanocobalamin (B-12 COMPLIANCE INJECTION IJ), Inject 1 Dose as directed every 30 (thirty) days., Disp: , Rfl:  .  fluticasone (FLONASE) 50 MCG/ACT nasal spray, SHAKE LIQUID AND USE 2 SPRAYS IN EACH NOSTRIL EVERY DAY, Disp: 48 g, Rfl: 1 .  fluvoxaMINE (LUVOX) 100 MG tablet, Take 150 mg by mouth every morning. , Disp: , Rfl:  .  gabapentin (NEURONTIN) 100 MG capsule,  Take 200 mg by mouth 3 (three) times daily. , Disp: , Rfl:  .  ketoconazole (NIZORAL) 2 % shampoo, APPLY EXTERNALLY 2 TIMES A WEEK, Disp: 120 mL, Rfl: 2 .  lamoTRIgine (LAMICTAL) 200 MG tablet, Take 200 mg by mouth every morning. , Disp: , Rfl:  .  levothyroxine (SYNTHROID) 50 MCG tablet, Take 1 tablet (50 mcg total) by mouth daily before breakfast., Disp: 90 tablet, Rfl: 1 .  medroxyPROGESTERone (DEPO-PROVERA) 150 MG/ML injection, Bring depo injection to the office each visit., Disp: 1 mL, Rfl: 1 .  Multiple Vitamin (MULTIVITAMIN WITH MINERALS) TABS tablet, Take 1 tablet by mouth daily., Disp: , Rfl:  .  pantoprazole (PROTONIX) 20 MG tablet, TAKE 1 TABLET(20 MG) BY MOUTH DAILY, Disp: 90 tablet, Rfl: 1 .  tretinoin (RETIN-A) 0.025 % cream, APPLY EXTERNALLY TO THE AFFECTED AREA AT BEDTIME AS NEEDED (Patient taking differently: Apply 1 application topically at bedtime as needed (acne).), Disp: 45 g, Rfl:  2  Allergies  Allergen Reactions  . Bee Venom Anaphylaxis, Hives and Swelling    Carries Epi pen.   . Cat Hair Extract Itching, Other (See Comments) and Swelling    Allergic to trees, nuts, wheat, grass, cats & dogs - itchy watery eyes, swelling. Uses Zyrtec & Flonase & Benadryl if really bad. Used to get allergy shots. Allergic to trees, nuts, wheat, grass, cats & dogs - itchy watery eyes, swelling. Uses Zyrtec & Flonase & Benadryl if really bad. Used to get allergy shots.  . Dog Epithelium Cough and Shortness Of Breath  . Dog Fennel Cough and Shortness Of Breath  . Dust Mite Extract Cough and Shortness Of Breath  . Tetracyclines & Related Nausea And Vomiting  . Lac Bovis Diarrhea and Nausea And Vomiting  . Lactose Diarrhea and Nausea And Vomiting  . Milk Protein Diarrhea and Nausea And Vomiting  . Tape Other (See Comments) and Rash    Needs to use paper tape. Breaks out with severe rash, pulls skin off when using adhesive.    Objective:   LMP  (LMP Unknown)   Patient is well-developed, well-nourished in no acute distress.  Resting comfortably  at home.  Head is normocephalic, atraumatic.  No labored breathing.  Speech is clear and coherent with logical content.  Patient is alert and oriented at baseline.  Wet productive ocugh voice hoarse  Assessment and Plan:        [name] in today with chief complaint of No chief complaint on file.   1. URI with cough and congestion 1. Take meds as prescribed 2. Use a cool mist humidifier especially during the winter months and when heat has been humid. 3. Use saline nose sprays frequently 4. Saline irrigations of the nose can be very helpful if done frequently.  * 4X daily for 1 week*  * Use of a nettie pot can be helpful with this. Follow directions with this* 5. Drink plenty of fluids 6. Keep thermostat turn down low 7.For any cough or congestion  Use plain Mucinex- regular strength or max strength is fine   *  Children- consult with Pharmacist for dosing 8. For fever or aces or pains- take tylenol or ibuprofen appropriate for age and weight.  * for fevers greater than 101 orally you may alternate ibuprofen and tylenol every  3 hours.   Meds ordered this encounter  Medications  . azithromycin (ZITHROMAX Z-PAK) 250 MG tablet    Sig: As directed    Dispense:  6  tablet    Refill:  0    Order Specific Question:   Supervising Provider    Answer:   Sabra Heck, BRIAN [[id number]]  . DISCONTD: Chlorphen-PE-Acetaminophen 4-10-325 MG TABS    Sig: Take 1 tablet by mouth every 6 (six) hours as needed.    Dispense:  20 tablet    Refill:  0    Order Specific Question:   Supervising Provider    Answer:   MILLER, BRIAN [[id number]]  . Chlorphen-PE-Acetaminophen 4-10-325 MG TABS    Sig: Take 1 tablet by mouth every 6 (six) hours as needed.    Dispense:  20 tablet    Refill:  0    Cancel patient allergic to tylenol    Order Specific Question:   Supervising Provider    Answer:   Noemi Chapel [[id number]]  . benzonatate (TESSALON PERLES) 100 MG capsule    Sig: Take 1 capsule (100 mg total) by mouth 3 (three) times daily as needed.    Dispense:  20 capsule    Refill:  0    Cancel norel AD    Order Specific Question:   Supervising Provider    Answer:   Noemi Chapel [[id number]]       The above assessment and management plan was discussed with the patient. The patient verbalized understanding of and has agreed to the management plan. Patient is aware to call the clinic if symptoms persist or worsen. Patient is aware when to return to the clinic for a follow-up visit. Patient educated on when it is appropriate to go to the emergency department.  Chevis Pretty, FNP 04/12/2021  Time spent with the patient: 15 minutes, of which >50% was spent in obtaining information about symptoms, reviewing previous labs, evaluations, and treatments, counseling about condition (please see the discussed topics above), and developing a  plan to further investigate it; had a number of questions which I addressed.

## 2021-04-15 DIAGNOSIS — F17210 Nicotine dependence, cigarettes, uncomplicated: Secondary | ICD-10-CM | POA: Diagnosis not present

## 2021-04-15 DIAGNOSIS — Z9049 Acquired absence of other specified parts of digestive tract: Secondary | ICD-10-CM | POA: Diagnosis not present

## 2021-04-15 DIAGNOSIS — J40 Bronchitis, not specified as acute or chronic: Secondary | ICD-10-CM | POA: Diagnosis not present

## 2021-04-15 DIAGNOSIS — M797 Fibromyalgia: Secondary | ICD-10-CM | POA: Diagnosis not present

## 2021-04-15 DIAGNOSIS — Z79890 Hormone replacement therapy (postmenopausal): Secondary | ICD-10-CM | POA: Diagnosis not present

## 2021-04-15 DIAGNOSIS — R0602 Shortness of breath: Secondary | ICD-10-CM | POA: Diagnosis not present

## 2021-04-15 DIAGNOSIS — E039 Hypothyroidism, unspecified: Secondary | ICD-10-CM | POA: Diagnosis not present

## 2021-04-15 DIAGNOSIS — I10 Essential (primary) hypertension: Secondary | ICD-10-CM | POA: Diagnosis not present

## 2021-04-15 DIAGNOSIS — Z20822 Contact with and (suspected) exposure to covid-19: Secondary | ICD-10-CM | POA: Diagnosis not present

## 2021-04-15 DIAGNOSIS — R059 Cough, unspecified: Secondary | ICD-10-CM | POA: Diagnosis not present

## 2021-04-15 DIAGNOSIS — Z79899 Other long term (current) drug therapy: Secondary | ICD-10-CM | POA: Diagnosis not present

## 2021-04-15 DIAGNOSIS — F32A Depression, unspecified: Secondary | ICD-10-CM | POA: Diagnosis not present

## 2021-04-15 DIAGNOSIS — F419 Anxiety disorder, unspecified: Secondary | ICD-10-CM | POA: Diagnosis not present

## 2021-04-15 DIAGNOSIS — K219 Gastro-esophageal reflux disease without esophagitis: Secondary | ICD-10-CM | POA: Diagnosis not present

## 2021-04-18 ENCOUNTER — Telehealth: Payer: Medicare Other | Admitting: Physician Assistant

## 2021-04-18 ENCOUNTER — Encounter: Payer: Self-pay | Admitting: Physician Assistant

## 2021-04-18 ENCOUNTER — Ambulatory Visit: Payer: Medicare Other | Admitting: Pulmonary Disease

## 2021-04-18 ENCOUNTER — Other Ambulatory Visit: Payer: Self-pay | Admitting: Family Medicine

## 2021-04-18 DIAGNOSIS — B9689 Other specified bacterial agents as the cause of diseases classified elsewhere: Secondary | ICD-10-CM

## 2021-04-18 DIAGNOSIS — J189 Pneumonia, unspecified organism: Secondary | ICD-10-CM | POA: Diagnosis not present

## 2021-04-18 DIAGNOSIS — F17200 Nicotine dependence, unspecified, uncomplicated: Secondary | ICD-10-CM | POA: Diagnosis not present

## 2021-04-18 DIAGNOSIS — R062 Wheezing: Secondary | ICD-10-CM | POA: Diagnosis not present

## 2021-04-18 DIAGNOSIS — J069 Acute upper respiratory infection, unspecified: Secondary | ICD-10-CM

## 2021-04-18 DIAGNOSIS — I10 Essential (primary) hypertension: Secondary | ICD-10-CM

## 2021-04-18 MED ORDER — PREDNISONE 20 MG PO TABS: ORAL_TABLET | ORAL | 0 refills | Status: DC

## 2021-04-18 MED ORDER — PREDNISONE 20 MG PO TABS: 40.0000 mg | ORAL_TABLET | Freq: Every day | ORAL | 0 refills | Status: DC

## 2021-04-18 MED ORDER — LEVOFLOXACIN 500 MG PO TABS: 500.0000 mg | ORAL_TABLET | Freq: Every day | ORAL | 0 refills | Status: AC

## 2021-04-18 NOTE — Patient Instructions (Addendum)
1. Community acquired pneumonia, unspecified laterality  - no improvement with z-pack  - Rx Levaquin (via e-visit)  - fever control with tylenol and motrin  2. Wheezing  - no improvement with prednisone burst  - Rx prednisone taper (this video visit)  - continue using albuterol as prescribed   Community-Acquired Pneumonia, Adult Pneumonia is an infection of the lungs. It causes irritation and swelling in the airways of the lungs. Mucus and fluid may also build up inside the airways. This may cause coughing and trouble breathing. One type of pneumonia can happen while you are in a hospital. A different type can happen when you are not in a hospital (community-acquired pneumonia). What are the causes? This condition is caused by germs (viruses, bacteria, or fungi). Some types of germs can spread from person to person. Pneumonia is not thought to spread from person to person.   What increases the risk? You are more likely to develop this condition if:  You have a long-term (chronic) disease, such as: ? Disease of the lungs. This may be chronic obstructive pulmonary disease (COPD) or asthma. ? Heart failure. ? Cystic fibrosis. ? Diabetes. ? Kidney disease. ? Sickle cell disease. ? HIV.  You have other health problems, such as: ? Your body's defense system (immune system) is weak. ? A condition that may cause you to breathe in fluids from your mouth and nose.  You had your spleen taken out.  You do not take good care of your teeth and mouth (poor dental hygiene).  You use or have used tobacco products.  You travel where the germs that cause this illness are common.  You are near certain animals or the places they live.  You are older than 49 years of age. What are the signs or symptoms? Symptoms of this condition include:  A cough.  A fever.  Sweating or chills.  Chest pain, often when you breathe deeply or cough.  Breathing problems, such as: ? Fast  breathing. ? Trouble breathing. ? Shortness of breath.  Feeling tired (fatigued).  Muscle aches. How is this treated? Treatment for this condition depends on many things, such as:  The cause of your illness.  Your medicines.  Your other health problems. Most adults can be treated at home. Sometimes, treatment must happen in a hospital.  Treatment may include medicines to kill germs.  Medicines may depend on which germ caused your illness. Very bad pneumonia is rare. If you get it, you may:  Have a machine to help you breathe.  Have fluid taken away from around your lungs. Follow these instructions at home: Medicines  Take over-the-counter and prescription medicines only as told by your doctor.  Take cough medicine only if you are losing sleep. Cough medicine can keep your body from taking mucus away from your lungs.  If you were prescribed an antibiotic medicine, take it as told by your doctor. Do not stop taking the antibiotic even if you start to feel better. Lifestyle  Do not drink alcohol.  Do not use any products that contain nicotine or tobacco, such as cigarettes, e-cigarettes, and chewing tobacco. If you need help quitting, ask your doctor.  Eat a healthy diet. This includes a lot of vegetables, fruits, whole grains, low-fat dairy products, and low-fat (lean) protein.      General instructions  Rest a lot. Sleep for at least 8 hours each night.  Sleep with your head and neck raised. Put a few pillows under your head  or sleep in a reclining chair.  Return to your normal activities as told by your doctor. Ask your doctor what activities are safe for you.  Drink enough fluid to keep your pee (urine) pale yellow.  If your throat is sore, rinse your mouth often with salt water. To make salt water, dissolve -1 tsp (3-6 g) of salt in 1 cup (237 mL) of warm water.  Keep all follow-up visits as told by your doctor. This is important.   How is this  prevented? You can lower your risk of pneumonia by:  Getting the pneumonia shot (vaccine). These shots have different types and schedules. Ask your doctor what works best for you. Think about getting this shot if: ? You are older than 49 years of age. ? You are 73-53 years of age and:  You are being treated for cancer.  You have long-term lung disease.  You have other problems that affect your body's defense system. Ask your doctor if you have one of these.  Getting your flu shot every year. Ask your doctor which type of shot is best for you.  Going to the dentist as often as told.  Washing your hands often with soap and water for at least 20 seconds. If you cannot use soap and water, use hand sanitizer. Contact a doctor if:  You have a fever.  You lose sleep because your cough medicine does not help. Get help right away if:  You are short of breath and this gets worse.  You have more chest pain.  Your sickness gets worse. This is very serious if: ? You are an older adult. ? Your body's defense system is weak.  You cough up blood. These symptoms may be an emergency. Do not wait to see if the symptoms will go away. Get medical help right away. Call your local emergency services (911 in the [place] Do not drive yourself to the hospital. Summary  Pneumonia is an infection of the lungs.  Community-acquired pneumonia affects people who have not been in the hospital. Certain germs can cause this infection.  This condition may be treated with medicines that kill germs.  For very bad pneumonia, you may need a hospital stay and treatment to help with breathing. This information is not intended to replace advice given to you by your health care provider. Make sure you discuss any questions you have with your health care provider. Document Revised: 09/15/2019 Document Reviewed: 09/15/2019 Elsevier Patient Education  [place].

## 2021-04-18 NOTE — Progress Notes (Signed)
[name], barmann are scheduled for a virtual visit with your provider today.    Just as we do with appointments in the office, we must obtain your consent to participate.  Your consent will be active for this visit and any virtual visit you may have with one of our providers in the next 365 days.    If you have a MyChart account, I can also send a copy of this consent to you electronically.  All virtual visits are billed to your insurance company just like a traditional visit in the office.  As this is a virtual visit, video technology does not allow for your provider to perform a traditional examination.  This may limit your provider's ability to fully assess your condition.  If your provider identifies any concerns that need to be evaluated in person or the need to arrange testing such as labs, EKG, etc, we will make arrangements to do so.    Although advances in technology are sophisticated, we cannot ensure that it will always work on either your end or our end.  If the connection with a video visit is poor, we may have to switch to a telephone visit.  With either a video or telephone visit, we are not always able to ensure that we have a secure connection.   I need to obtain your verbal consent now.   Are you willing to proceed with your visit today?   Amanda Webb has provided verbal consent on 04/18/2021 for a virtual visit (video or telephone).   [name], PA-C 04/18/2021  8:54 AM   Date:  04/18/2021   ID:  [name], DOB 1971/03/22, MRN [id number]  Patient Location: Home Provider Location: Home Office   Participants: Patient and Provider for Visit and Wrap up  Method of visit: Video  Location of Patient: Home Location of Provider: Home Office Consent was obtain for visit over the video. Services rendered by provider: Visit was performed via video  A video enabled telemedicine application was used and I verified that I am speaking with the correct person  using two identifiers.  PCP:  Olin Hauser, DO   Chief Complaint:  Cough, congestion  History of Present Illness:    Amanda Webb is a 50 y.o. female with history as stated below. Presents video telehealth for an acute care visit.  Pt submitted an e-visit which I responded to, but then immediatly scheduled a video visit.  Pt reports she was seen for bronchitis in the ER on Saturday.  She was given prednisone and a z-pack. She finished z-pack and is currently taking 20mg  BID (2 days remaining). COVID negative at that time and a negative chest x-ray.  Onset of symptoms was >1 week and symptoms have been persistent and include: fever, chills, cough, congestion, rust colored sputum. Pt has been using her inhaler a lot but reports this does not help.  Pt reports she is getting worse.  Does have some SOB but denies DOE.  Pt reports pneumonia in Feb and states this feels similar.  At that time she required Levaquin and a prednisone taper after failing Augmentin and a prednisone burst. She has never required hospitalization for her asthma/COPD/wheezing.      Denies exposure to covid or other sick contacts.   No other aggravating or relieving factors.  No other c/o.  The patient does have symptoms concerning for COVID-19 infection (fever, chills, cough, or new shortness of breath).  Patient has been tested for  COVID during this illness and was negative.  Past Medical, Surgical, Social History, Allergies, and Medications have been Reviewed.  Patient Active Problem List   Diagnosis Date Noted  . Major depressive disorder, recurrent, in partial remission ([place]) 11/08/2020  . Bronchitis 10/21/2020  . Cough 10/21/2020  . Acute non-recurrent sinusitis 08/24/2020  . Status post laparoscopic cholecystectomy 07/28/2020  . Morbid obesity ([place]) 07/05/2020  . Hepatic steatosis 06/23/2020  . Lymphedema 03/09/2020  . Chronic venous insufficiency 03/09/2020  . Bilateral lower extremity  edema 02/16/2020  . Female cystocele 10/21/2018  . Urinary incontinence, mixed 10/21/2018  . B12 deficiency 04/08/2018  . Iron deficiency anemia 03/14/2018  . GAD (generalized anxiety disorder) 03/10/2018  . Acute right-sided low back pain 10/28/2017  . Tobacco abuse 05/09/2017  . Multiple thyroid nodules 12/28/2016  . Simple chronic bronchitis ([place]) 12/13/2016  . BMI 40.0-44.9, adult ([place]) 09/26/2016  . HLD (hyperlipidemia) 09/26/2016  . Vitamin D deficiency 09/26/2016  . Pre-diabetes 06/17/2015  . Candidal intertrigo 02/21/2015  . Candidiasis of skin and nail 02/21/2015  . Cellulitis and abscess of leg 02/18/2015  . Benign cyst of right kidney 01/17/2015  . Wheezing 01/11/2015  . Chronic sinusitis 11/15/2014  . Pneumonia due to infectious organism 11/02/2014  . Whiplash injury 09/03/2014  . Heart palpitations 04/02/2014  . Lower urinary tract infectious disease 02/16/2014  . Allergic rhinitis 12/31/2013  . Spondylosis of lumbar region without myelopathy or radiculopathy 12/28/2013  . Diarrhea 11/09/2013  . Chronic low back pain 09/28/2013  . Other fatigue 09/28/2013  . Hypothyroidism 09/28/2013  . Alteration of body temperature 09/28/2013  . Plantar fasciitis, bilateral 07/10/2013  . Tarsal tunnel syndrome of right side 07/10/2013  . Plantar fascial fibromatosis 07/10/2013  . Encounter for surveillance of injectable contraceptive 05/13/2013  . Encounter for Depo-Provera contraception 05/13/2013  . Diuresis excessive 03/24/2013  . Abnormal uterine and vaginal bleeding, unspecified 03/24/2013  . Polyuria 03/24/2013  . Arthritis, multiple joint involvement 03/17/2013  . Fibroblastic disorder 07/28/2012  . Borderline personality disorder ([place]) 12/17/2010  . Essential hypertension 02/18/2010  . Carpal tunnel syndrome on both sides 12/17/2006  . Major depressive disorder, single episode 02/21/1998  . Disk prolapse 04/17/1995  . Depression with anxiety 03/18/1995  . OCD  (obsessive compulsive disorder) 03/18/1995  . Severe anxiety with panic 03/18/1995    Social History   Tobacco Use  . Smoking status: Current Some Day Smoker    Packs/day: 1.00    Years: 32.00    Pack years: 32.00    Types: Cigarettes  . Smokeless tobacco: Current User  . Tobacco comment: 1 ppd/ 11/22/2020  Substance Use Topics  . Alcohol use: Yes    Comment: wine occ     Current Outpatient Medications:  .  predniSONE (DELTASONE) 20 MG tablet, 3 tabs po daily x 3 days, then 2 tabs x 3 days, then 1.5 tabs x 3 days, then 1 tab x 3 days, then 0.5 tabs x 3 days, Disp: 27 tablet, Rfl: 0 .  Accu-Chek FastClix Lancets MISC, USE TO CHECK SUGAR UP TO TWICE DAILY AS DIRECTED, Disp: 102 each, Rfl: 5 .  ACCU-CHEK GUIDE test strip, CHECK BLOOD SUGAR UP TO TWICE DAILY AS DIRECTED, Disp: 200 strip, Rfl: 5 .  acetaminophen (TYLENOL) 500 MG tablet, Take 1,000 mg by mouth every 6 (six) hours as needed for moderate pain or headache., Disp: , Rfl:  .  albuterol (VENTOLIN HFA) 108 (90 Base) MCG/ACT inhaler, INHALE 1 TO 2 PUFFS BY MOUTH EVERY 4  TO 6 HOURS AS NEEDED FOR SHORTNESS OF BREATH, Disp: 18 g, Rfl: 2 .  ARIPiprazole (ABILIFY) 2 MG tablet, Take 2 mg by mouth every morning. , Disp: , Rfl:  .  atenolol (TENORMIN) 50 MG tablet, TAKE 1 TABLET(50 MG) BY MOUTH DAILY, Disp: 90 tablet, Rfl: 1 .  azelastine (ASTELIN) 0.1 % nasal spray, Place 1 spray into both nostrils 2 (two) times daily. Use in each nostril as directed, Disp: 30 mL, Rfl: 5 .  azithromycin (ZITHROMAX Z-PAK) 250 MG tablet, As directed, Disp: 6 tablet, Rfl: 0 .  benzonatate (TESSALON PERLES) 100 MG capsule, Take 1 capsule (100 mg total) by mouth 3 (three) times daily as needed., Disp: 20 capsule, Rfl: 0 .  Cetirizine HCl 10 MG CAPS, Take 10 mg by mouth daily with lunch. , Disp: , Rfl:  .  Chlorphen-PE-Acetaminophen 4-10-325 MG TABS, Take 1 tablet by mouth every 6 (six) hours as needed., Disp: 20 tablet, Rfl: 0 .  Cyanocobalamin (B-12  COMPLIANCE INJECTION IJ), Inject 1 Dose as directed every 30 (thirty) days., Disp: , Rfl:  .  fluticasone (FLONASE) 50 MCG/ACT nasal spray, SHAKE LIQUID AND USE 2 SPRAYS IN EACH NOSTRIL EVERY DAY, Disp: 48 g, Rfl: 1 .  fluvoxaMINE (LUVOX) 100 MG tablet, Take 150 mg by mouth every morning. , Disp: , Rfl:  .  gabapentin (NEURONTIN) 100 MG capsule, Take 200 mg by mouth 3 (three) times daily. , Disp: , Rfl:  .  ketoconazole (NIZORAL) 2 % shampoo, APPLY EXTERNALLY 2 TIMES A WEEK, Disp: 120 mL, Rfl: 2 .  lamoTRIgine (LAMICTAL) 200 MG tablet, Take 200 mg by mouth every morning. , Disp: , Rfl:  .  levofloxacin (LEVAQUIN) 500 MG tablet, Take 1 tablet (500 mg total) by mouth daily for 7 days., Disp: 7 tablet, Rfl: 0 .  levothyroxine (SYNTHROID) 50 MCG tablet, Take 1 tablet (50 mcg total) by mouth daily before breakfast., Disp: 90 tablet, Rfl: 1 .  medroxyPROGESTERone (DEPO-PROVERA) 150 MG/ML injection, Bring depo injection to the office each visit., Disp: 1 mL, Rfl: 1 .  Multiple Vitamin (MULTIVITAMIN WITH MINERALS) TABS tablet, Take 1 tablet by mouth daily., Disp: , Rfl:  .  pantoprazole (PROTONIX) 20 MG tablet, TAKE 1 TABLET(20 MG) BY MOUTH DAILY, Disp: 90 tablet, Rfl: 1 .  tretinoin (RETIN-A) 0.025 % cream, APPLY EXTERNALLY TO THE AFFECTED AREA AT BEDTIME AS NEEDED (Patient taking differently: Apply 1 application topically at bedtime as needed (acne).), Disp: 45 g, Rfl: 2   Allergies  Allergen Reactions  . Bee Venom Anaphylaxis, Hives and Swelling    Carries Epi pen.   . Cat Hair Extract Itching, Other (See Comments) and Swelling    Allergic to trees, nuts, wheat, grass, cats & dogs - itchy watery eyes, swelling. Uses Zyrtec & Flonase & Benadryl if really bad. Used to get allergy shots. Allergic to trees, nuts, wheat, grass, cats & dogs - itchy watery eyes, swelling. Uses Zyrtec & Flonase & Benadryl if really bad. Used to get allergy shots.  . Dog Epithelium Cough and Shortness Of Breath  . Dog Fennel  Cough and Shortness Of Breath  . Dust Mite Extract Cough and Shortness Of Breath  . Tetracyclines & Related Nausea And Vomiting  . Lac Bovis Diarrhea and Nausea And Vomiting  . Lactose Diarrhea and Nausea And Vomiting  . Milk Protein Diarrhea and Nausea And Vomiting  . Tape Other (See Comments) and Rash    Needs to use paper tape. Breaks out with severe  rash, pulls skin off when using adhesive.     Review of Systems  Constitutional: Positive for chills, fever and malaise/fatigue.  HENT: Positive for sore throat. Negative for congestion and ear pain.   Eyes: Negative for blurred vision and double vision.  Respiratory: Positive for cough, sputum production, shortness of breath and wheezing.   Cardiovascular: Negative for chest pain, palpitations and leg swelling.  Gastrointestinal: Negative for abdominal pain, diarrhea, nausea and vomiting.  Genitourinary: Negative for dysuria.  Musculoskeletal: Negative for myalgias.  Skin: Negative for rash.  Neurological: Negative for loss of consciousness, weakness and headaches.  Psychiatric/Behavioral: The patient is not nervous/anxious.    See HPI for history of present illness.  Physical Exam Constitutional:      General: She is not in acute distress.    Appearance: Normal appearance. She is not ill-appearing.     Comments: Hoarse voice, no stridor  HENT:     Head: Normocephalic and atraumatic.     Nose: No congestion.  Eyes:     Extraocular Movements: Extraocular movements intact.  Pulmonary:     Effort: Pulmonary effort is normal.     Comments: Wheezy cough, no accessory muscle usage or increased work of breathing Musculoskeletal:        General: Normal range of motion.     Cervical back: Normal range of motion.  Skin:    Coloration: Skin is not pale.  Neurological:     General: No focal deficit present.     Mental Status: She is alert. Mental status is at baseline.  Psychiatric:        Mood and Affect: Mood normal.                A&P  1. Community acquired pneumonia, unspecified laterality  - no improvement with z-pack  - Rx Levaquin (via e-visit)  - fever control with tylenol and motrin  2. Wheezing  - no improvement with prednisone burst  - Rx prednisone taper (this video visit)  - continue using albuterol as prescribed  3. Current Smoker   Patient voiced understanding and agreement to plan.   Time:   Today, I have spent 15 minutes with the patient with telehealth technology discussing the above problems, reviewing the chart, previous notes, medications and orders.    Tests Ordered: No orders of the defined types were placed in this encounter.   Medication Changes: Meds ordered this encounter  Medications  . predniSONE (DELTASONE) 20 MG tablet    Sig: 3 tabs po daily x 3 days, then 2 tabs x 3 days, then 1.5 tabs x 3 days, then 1 tab x 3 days, then 0.5 tabs x 3 days    Dispense:  27 tablet    Refill:  0     Disposition:  Follow up in ER if symptoms worsen, specifically if shortness of breath increases, or if symptoms do not improve.   Celso Sickle, PA-C  04/18/2021 9:06 AM

## 2021-04-18 NOTE — Progress Notes (Signed)
We are sorry that you are not feeling well.  Here is how we plan to help!  Based on your presentation I believe you most likely have A cough due to bacteria.  When patients have a fever and a productive cough with a change in color or increased sputum production, we are concerned about bacterial bronchitis.  If left untreated it can progress to pneumonia.  If your symptoms do not improve with your treatment plan it is important that you contact your provider.   I have prescribed Levofloxacin 500 mg daily for 7 days    In addition you may use A prescription cough medication called Tessalon Perles 100mg . You may take 1-2 capsules every 8 hours as needed for your cough.  Prednisone 40 mg daily for 5 days. This will elevate your blood sugar so please keep a close eye on it.  From your responses in the eVisit questionnaire you describe inflammation in the upper respiratory tract which is causing a significant cough.  This is commonly called Bronchitis and has four common causes:    Allergies  Viral Infections  Acid Reflux  Bacterial Infection Allergies, viruses and acid reflux are treated by controlling symptoms or eliminating the cause. An example might be a cough caused by taking certain blood pressure medications. You stop the cough by changing the medication. Another example might be a cough caused by acid reflux. Controlling the reflux helps control the cough.  USE OF BRONCHODILATOR ("RESCUE") INHALERS: There is a risk from using your bronchodilator too frequently.  The risk is that over-reliance on a medication which only relaxes the muscles surrounding the breathing tubes can reduce the effectiveness of medications prescribed to reduce swelling and congestion of the tubes themselves.  Although you feel brief relief from the bronchodilator inhaler, your asthma may actually be worsening with the tubes becoming more swollen and filled with mucus.  This can delay other crucial treatments, such as  oral steroid medications. If you need to use a bronchodilator inhaler daily, several times per day, you should discuss this with your provider.  There are probably better treatments that could be used to keep your asthma under control.     HOME CARE . Only take medications as instructed by your medical team. . Complete the entire course of an antibiotic. . Drink plenty of fluids and get plenty of rest. . Avoid close contacts especially the very young and the elderly . Cover your mouth if you cough or cough into your sleeve. . Always remember to wash your hands . A steam or ultrasonic humidifier can help congestion.   GET HELP RIGHT AWAY IF: . You develop worsening fever. . You become short of breath . You cough up blood. . Your symptoms persist after you have completed your treatment plan MAKE SURE YOU   Understand these instructions.  Will watch your condition.  Will get help right away if you are not doing well or get worse.  Your e-visit answers were reviewed by a board certified advanced clinical practitioner to complete your personal care plan.  Depending on the condition, your plan could have included both over the counter or prescription medications. If there is a problem please reply  once you have received a response from your provider. Your safety is important to [place].  If you have drug allergies check your prescription carefully.    You can use MyChart to ask questions about today's visit, request a non-urgent call back, or ask for a work or  school excuse for 24 hours related to this e-Visit. If it has been greater than 24 hours you will need to follow up with your provider, or enter a new e-Visit to address those concerns. You will get an e-mail in the next two days asking about your experience.  I hope that your e-visit has been valuable and will speed your recovery. Thank you for using e-visits.  Greater than 5 minutes, yet less than 10 minutes of time have been spent  researching, coordinating, and implementing care for this patient today

## 2021-04-27 ENCOUNTER — Inpatient Hospital Stay: Payer: Medicare Other | Attending: Hematology and Oncology

## 2021-04-27 ENCOUNTER — Other Ambulatory Visit: Payer: Self-pay

## 2021-04-27 DIAGNOSIS — D509 Iron deficiency anemia, unspecified: Secondary | ICD-10-CM

## 2021-04-27 DIAGNOSIS — E538 Deficiency of other specified B group vitamins: Secondary | ICD-10-CM | POA: Diagnosis not present

## 2021-04-27 MED ORDER — CYANOCOBALAMIN 1000 MCG/ML IJ SOLN
1000.0000 ug | Freq: Once | INTRAMUSCULAR | Status: AC
  Administered 2021-04-27: 1000 ug via INTRAMUSCULAR
  Filled 2021-04-27: qty 1

## 2021-05-08 DIAGNOSIS — I10 Essential (primary) hypertension: Secondary | ICD-10-CM | POA: Diagnosis not present

## 2021-05-08 DIAGNOSIS — E119 Type 2 diabetes mellitus without complications: Secondary | ICD-10-CM | POA: Diagnosis not present

## 2021-05-08 DIAGNOSIS — H16223 Keratoconjunctivitis sicca, not specified as Sjogren's, bilateral: Secondary | ICD-10-CM | POA: Diagnosis not present

## 2021-05-08 LAB — HM DIABETES EYE EXAM

## 2021-05-09 ENCOUNTER — Encounter: Payer: Self-pay | Admitting: Oncology

## 2021-05-11 ENCOUNTER — Other Ambulatory Visit: Payer: Self-pay | Admitting: Family Medicine

## 2021-05-11 DIAGNOSIS — J309 Allergic rhinitis, unspecified: Secondary | ICD-10-CM

## 2021-05-11 NOTE — Telephone Encounter (Signed)
Requested Prescriptions  Pending Prescriptions Disp Refills  . fluticasone (FLONASE) 50 MCG/ACT nasal spray [Pharmacy Med Name: FLUTICASONE 50MCG NAS SP(120SP) RX] 48 g 1    Sig: SHAKE LIQUID AND USE 2 SPRAYS IN EACH NOSTRIL EVERY DAY     Ear, Nose, and Throat: Nasal Preparations - Corticosteroids Passed - 05/11/2021 12:48 PM      Passed - Valid encounter within last 12 months    Recent Outpatient Visits          2 months ago Atypical pneumonia   [place], DO   3 months ago Atypical pneumonia   [place], DO   6 months ago Hypothyroidism, unspecified type   [facility] [place], Devonne Doughty, DO   6 months ago [facility] [place], Lupita Raider, FNP   6 months ago [facility] [place], Lupita Raider, FNP      Future Appointments            In 4 months Amalia Hailey, Nyoka Lint, MD [facility]   In 10 months  [facility], [facility]

## 2021-05-12 DIAGNOSIS — F4310 Post-traumatic stress disorder, unspecified: Secondary | ICD-10-CM | POA: Diagnosis not present

## 2021-06-01 ENCOUNTER — Other Ambulatory Visit: Payer: Self-pay

## 2021-06-01 DIAGNOSIS — E538 Deficiency of other specified B group vitamins: Secondary | ICD-10-CM

## 2021-06-01 DIAGNOSIS — D509 Iron deficiency anemia, unspecified: Secondary | ICD-10-CM

## 2021-06-06 ENCOUNTER — Other Ambulatory Visit (HOSPITAL_COMMUNITY): Payer: Self-pay | Admitting: Sports Medicine

## 2021-06-06 ENCOUNTER — Inpatient Hospital Stay: Payer: Medicare Other | Attending: Oncology

## 2021-06-06 ENCOUNTER — Other Ambulatory Visit: Payer: Self-pay

## 2021-06-06 ENCOUNTER — Other Ambulatory Visit: Payer: Self-pay | Admitting: Sports Medicine

## 2021-06-06 ENCOUNTER — Other Ambulatory Visit: Payer: Medicare Other

## 2021-06-06 DIAGNOSIS — D509 Iron deficiency anemia, unspecified: Secondary | ICD-10-CM | POA: Insufficient documentation

## 2021-06-06 DIAGNOSIS — M76891 Other specified enthesopathies of right lower limb, excluding foot: Secondary | ICD-10-CM

## 2021-06-06 DIAGNOSIS — M2391 Unspecified internal derangement of right knee: Secondary | ICD-10-CM | POA: Diagnosis not present

## 2021-06-06 DIAGNOSIS — M25561 Pain in right knee: Secondary | ICD-10-CM | POA: Diagnosis not present

## 2021-06-06 DIAGNOSIS — M1711 Unilateral primary osteoarthritis, right knee: Secondary | ICD-10-CM

## 2021-06-06 DIAGNOSIS — G8929 Other chronic pain: Secondary | ICD-10-CM | POA: Diagnosis not present

## 2021-06-06 DIAGNOSIS — M25461 Effusion, right knee: Secondary | ICD-10-CM

## 2021-06-06 DIAGNOSIS — E538 Deficiency of other specified B group vitamins: Secondary | ICD-10-CM | POA: Diagnosis not present

## 2021-06-06 DIAGNOSIS — W19XXXA Unspecified fall, initial encounter: Secondary | ICD-10-CM

## 2021-06-06 LAB — CBC WITH DIFFERENTIAL/PLATELET
Abs Immature Granulocytes: 0.03 10*3/uL (ref 0.00–0.07)
Basophils Absolute: 0 10*3/uL (ref 0.0–0.1)
Basophils Relative: 0 %
Eosinophils Absolute: 0.2 10*3/uL (ref 0.0–0.5)
Eosinophils Relative: 2 %
HCT: 40.7 % (ref 36.0–46.0)
Hemoglobin: 14.1 g/dL (ref 12.0–15.0)
Immature Granulocytes: 0 %
Lymphocytes Relative: 32 %
Lymphs Abs: 3.2 10*3/uL (ref 0.7–4.0)
MCH: 31.8 pg (ref 26.0–34.0)
MCHC: 34.6 g/dL (ref 30.0–36.0)
MCV: 91.9 fL (ref 80.0–100.0)
Monocytes Absolute: 0.6 10*3/uL (ref 0.1–1.0)
Monocytes Relative: 6 %
Neutro Abs: 5.9 10*3/uL (ref 1.7–7.7)
Neutrophils Relative %: 60 %
Platelets: 229 10*3/uL (ref 150–400)
RBC: 4.43 MIL/uL (ref 3.87–5.11)
RDW: 12.8 % (ref 11.5–15.5)
WBC: 10 10*3/uL (ref 4.0–10.5)
nRBC: 0 % (ref 0.0–0.2)

## 2021-06-06 LAB — FERRITIN: Ferritin: 78 ng/mL (ref 11–307)

## 2021-06-07 ENCOUNTER — Inpatient Hospital Stay (HOSPITAL_BASED_OUTPATIENT_CLINIC_OR_DEPARTMENT_OTHER): Payer: Medicare Other | Admitting: Oncology

## 2021-06-07 ENCOUNTER — Ambulatory Visit: Payer: Medicare Other

## 2021-06-07 ENCOUNTER — Ambulatory Visit: Payer: Medicare Other | Admitting: Family Medicine

## 2021-06-07 ENCOUNTER — Encounter: Payer: Self-pay | Admitting: Oncology

## 2021-06-07 ENCOUNTER — Inpatient Hospital Stay: Payer: Medicare Other

## 2021-06-07 ENCOUNTER — Other Ambulatory Visit: Payer: Medicare Other

## 2021-06-07 ENCOUNTER — Ambulatory Visit: Payer: Medicare Other | Admitting: Oncology

## 2021-06-07 VITALS — BP 116/72 | HR 69 | Resp 20 | Wt 263.1 lb

## 2021-06-07 DIAGNOSIS — D509 Iron deficiency anemia, unspecified: Secondary | ICD-10-CM | POA: Diagnosis not present

## 2021-06-07 DIAGNOSIS — E538 Deficiency of other specified B group vitamins: Secondary | ICD-10-CM | POA: Diagnosis not present

## 2021-06-07 MED ORDER — CYANOCOBALAMIN 1000 MCG/ML IJ SOLN
1000.0000 ug | Freq: Once | INTRAMUSCULAR | Status: AC
  Administered 2021-06-07: 1000 ug via INTRAMUSCULAR

## 2021-06-08 ENCOUNTER — Encounter: Payer: Self-pay | Admitting: Urgent Care

## 2021-06-08 NOTE — Progress Notes (Signed)
Hematology/Oncology Consult note [facility]  [phone] [phone]  Patient Care Team: Olin Hauser, DO as PCP - General (Family Medicine) Minna Merritts, MD as Consulting Physician (Cardiology) Sindy Guadeloupe, MD as Consulting Physician (Hematology and Oncology)   Name of the patient: Amanda Webb  [id number]  1971-09-17   Date of visit: 06/08/21  Diagnosis-history of iron B12 deficiency anemia  Chief complaint/ Reason for visit-routine follow-up of iron and B12 deficiency anemia  Heme/Onc history: Patient is a 50 year old female with history of iron and B12 deficiency anemia which has been attribute it to menorrhagia.  She has positive intrinsic factor antibody and she is on B12 injections.  Interval history-patient reports doing well and denies any specific complaints at this time.   ECOG PS- 0 Pain scale- 0   Review of systems- Review of Systems  Constitutional:  Negative for chills, fever, malaise/fatigue and weight loss.  HENT:  Negative for congestion, ear discharge and nosebleeds.   Eyes:  Negative for blurred vision.  Respiratory:  Negative for cough, hemoptysis, sputum production, shortness of breath and wheezing.   Cardiovascular:  Negative for chest pain, palpitations, orthopnea and claudication.  Gastrointestinal:  Negative for abdominal pain, blood in stool, constipation, diarrhea, heartburn, melena, nausea and vomiting.  Genitourinary:  Negative for dysuria, flank pain, frequency, hematuria and urgency.  Musculoskeletal:  Negative for back pain, joint pain and myalgias.  Skin:  Negative for rash.  Neurological:  Negative for dizziness, tingling, focal weakness, seizures, weakness and headaches.  Endo/Heme/Allergies:  Does not bruise/bleed easily.  Psychiatric/Behavioral:  Negative for depression and suicidal ideas. The patient does not have insomnia.       Allergies  Allergen Reactions   Bee  Venom Anaphylaxis, Hives and Swelling    Carries Epi pen.    Cat Hair Extract Itching, Other (See Comments) and Swelling    Allergic to trees, nuts, wheat, grass, cats & dogs - itchy watery eyes, swelling. Uses Zyrtec & Flonase & Benadryl if really bad. Used to get allergy shots. Allergic to trees, nuts, wheat, grass, cats & dogs - itchy watery eyes, swelling. Uses Zyrtec & Flonase & Benadryl if really bad. Used to get allergy shots.   Dog Epithelium Cough and Shortness Of Breath   Dog Epithelium Allergy Skin Test Shortness Of Breath   Dog Fennel Cough and Shortness Of Breath   Dog Fennel Allergy Skin Test Shortness Of Breath   Dust Mite Extract Cough and Shortness Of Breath   Tetracyclines & Related Nausea And Vomiting   Lac Bovis Diarrhea and Nausea And Vomiting   Lactose Diarrhea and Nausea And Vomiting   Milk Protein Diarrhea and Nausea And Vomiting   Tape Other (See Comments) and Rash    Needs to use paper tape. Breaks out with severe rash, pulls skin off when using adhesive.     Past Medical History:  Diagnosis Date   Allergy    Anemia    Anxiety    Arrhythmia    Arthritis    Cervical dysplasia    Cystitis    Depression    GERD (gastroesophageal reflux disease)    Gross hematuria    Headache    Heart murmur    asymptomatic   History of methicillin resistant staphylococcus aureus (MRSA) 2013   HLD (hyperlipidemia)    Hypertension    Hypothyroid    Lymphedema    Palpitations    Pernicious anemia    Tobacco abuse  Past Surgical History:  Procedure Laterality Date   CARPAL TUNNEL RELEASE Bilateral 2011   FOOT SURGERY Right 2013   Plantar fascia   ROBOTIC ASSISTED LAPAROSCOPIC CHOLECYSTECTOMY  07/15/2020   Dr Christian Mate   TONSILLECTOMY  1979   with ear tubes    Social History   Socioeconomic History   Marital status: Divorced    Spouse name: Not on file   Number of children: Not on file   Years of education: Some college   Highest education level:  Some college, no degree  Occupational History   Occupation: disability   Tobacco Use   Smoking status: Some Days    Packs/day: 1.00    Years: 32.00    Pack years: 32.00    Types: Cigarettes   Smokeless tobacco: Current   Tobacco comments:    1 ppd/ 11/22/2020  Vaping Use   Vaping Use: Never used  Substance and Sexual Activity   Alcohol use: Yes    Comment: wine occ   Drug use: No   Sexual activity: Not Currently    Birth control/protection: Condom, Injection  Other Topics Concern   Not on file  Social History Narrative   Lives with mom   Social Determinants of Health   Financial Resource Strain: Low Risk    Difficulty of Paying Living Expenses: Not hard at all  Food Insecurity: No Food Insecurity   Worried About Charity fundraiser in the Last Year: Never true   [place] in the Last Year: Never true  Transportation Needs: No Transportation Needs   Lack of Transportation (Medical): No   Lack of Transportation (Non-Medical): No  Physical Activity: Inactive   Days of Exercise per Week: 0 days   Minutes of Exercise per Session: 0 min  Stress: Stress Concern Present   Feeling of Stress : Very much  Social Connections: Not on file  Intimate Partner Violence: Not on file    Family History  Problem Relation Age of Onset   Depression Mother    Mental illness Mother    Alcohol abuse Mother    Lung cancer Mother    Cancer Maternal Grandmother    Diabetes Maternal Grandmother    Ovarian cancer Maternal Grandmother    Kidney disease Maternal Grandfather    Diabetes Father    Mental illness Father    Depression Father    Drug abuse Father    Depression Paternal Grandfather    Drug abuse Paternal Grandfather    Bladder Cancer Neg Hx    Breast cancer Neg Hx      Current Outpatient Medications:    Accu-Chek FastClix Lancets MISC, USE TO CHECK SUGAR UP TO TWICE DAILY AS DIRECTED, Disp: 102 each, Rfl: 5   ACCU-CHEK GUIDE test strip, CHECK BLOOD SUGAR UP TO TWICE  DAILY AS DIRECTED, Disp: 200 strip, Rfl: 5   acetaminophen (TYLENOL) 500 MG tablet, Take 1,000 mg by mouth every 6 (six) hours as needed for moderate pain or headache., Disp: , Rfl:    albuterol (VENTOLIN HFA) 108 (90 Base) MCG/ACT inhaler, INHALE 1 TO 2 PUFFS BY MOUTH EVERY 4 TO 6 HOURS AS NEEDED FOR SHORTNESS OF BREATH, Disp: 18 g, Rfl: 2   ARIPiprazole (ABILIFY) 2 MG tablet, Take 2 mg by mouth every morning. , Disp: , Rfl:    atenolol (TENORMIN) 50 MG tablet, TAKE 1 TABLET(50 MG) BY MOUTH DAILY, Disp: 90 tablet, Rfl: 1   azelastine (ASTELIN) 0.1 % nasal spray, Place 1 spray  into both nostrils 2 (two) times daily. Use in each nostril as directed, Disp: 30 mL, Rfl: 5   Cetirizine HCl 10 MG CAPS, Take 10 mg by mouth daily with lunch. , Disp: , Rfl:    Cyanocobalamin (B-12 COMPLIANCE INJECTION IJ), Inject 1 Dose as directed every 30 (thirty) days., Disp: , Rfl:    cyclobenzaprine (FLEXERIL) 10 MG tablet, , Disp: , Rfl:    cycloSPORINE (RESTASIS) 0.05 % ophthalmic emulsion, Apply to eye., Disp: , Rfl:    diclofenac (VOLTAREN) 50 MG EC tablet, Take by mouth., Disp: , Rfl:    fluticasone (FLONASE) 50 MCG/ACT nasal spray, SHAKE LIQUID AND USE 2 SPRAYS IN EACH NOSTRIL EVERY DAY, Disp: 48 g, Rfl: 1   Fluticasone-Umeclidin-Vilant (TRELEGY ELLIPTA) 100-62.5-25 MCG/INH AEPB, Inhale into the lungs., Disp: , Rfl:    fluvoxaMINE (LUVOX) 100 MG tablet, Take 150 mg by mouth every morning. , Disp: , Rfl:    gabapentin (NEURONTIN) 100 MG capsule, Take 200 mg by mouth 3 (three) times daily. , Disp: , Rfl:    ketoconazole (NIZORAL) 2 % shampoo, APPLY EXTERNALLY 2 TIMES A WEEK, Disp: 120 mL, Rfl: 2   lamoTRIgine (LAMICTAL) 200 MG tablet, Take 200 mg by mouth every morning. , Disp: , Rfl:    levothyroxine (SYNTHROID) 50 MCG tablet, Take 1 tablet (50 mcg total) by mouth daily before breakfast., Disp: 90 tablet, Rfl: 1   medroxyPROGESTERone (DEPO-PROVERA) 150 MG/ML injection, Bring depo injection to the office each  visit., Disp: 1 mL, Rfl: 1   Multiple Vitamin (MULTIVITAMIN WITH MINERALS) TABS tablet, Take 1 tablet by mouth daily., Disp: , Rfl:    pantoprazole (PROTONIX) 20 MG tablet, TAKE 1 TABLET(20 MG) BY MOUTH DAILY, Disp: 90 tablet, Rfl: 1   QUEtiapine (SEROQUEL) 50 MG tablet, Take by mouth., Disp: , Rfl:    SUMAtriptan (IMITREX) 5 MG/ACT nasal spray, Place into the nose., Disp: , Rfl:    traZODone (DESYREL) 50 MG tablet, TAKE 1 TO 3 TABLETS BY MOUTH 1 TO 2 HOURS BEFORE BEDTIME, Disp: , Rfl:    tretinoin (RETIN-A) 0.025 % cream, APPLY EXTERNALLY TO THE AFFECTED AREA AT BEDTIME AS NEEDED (Patient taking differently: Apply 1 application topically at bedtime as needed (acne).), Disp: 45 g, Rfl: 2  Physical exam:  Vitals:   06/07/21 1306  BP: 116/72  Pulse: 69  Resp: 20  SpO2: 99%  Weight: 263 lb 1.9 oz (119.3 kg)   Physical Exam Constitutional:      General: She is not in acute distress. HENT:     Head: Normocephalic and atraumatic.  Eyes:     Pupils: Pupils are equal, round, and reactive to light.  Cardiovascular:     Rate and Rhythm: Normal rate and regular rhythm.     Heart sounds: Normal heart sounds.  Pulmonary:     Effort: Pulmonary effort is normal.     Breath sounds: Normal breath sounds.  Abdominal:     General: Bowel sounds are normal.     Palpations: Abdomen is soft.  Musculoskeletal:     Cervical back: Normal range of motion.  Skin:    General: Skin is warm and dry.  Neurological:     Mental Status: She is alert and oriented to person, place, and time.     CMP Latest Ref Rng & Units 11/08/2020  Glucose 65 - 99 mg/dL 124(H)  BUN 7 - 25 mg/dL 7  Creatinine 0.50 - 1.10 mg/dL 0.64  Sodium 135 - 146 mmol/L 140  Potassium 3.5 - 5.3 mmol/L 4.2  Chloride 98 - 110 mmol/L 103  CO2 20 - 32 mmol/L 28  Calcium 8.6 - 10.2 mg/dL 9.6  Total Protein 6.1 - 8.1 g/dL 7.2  Total Bilirubin 0.2 - 1.2 mg/dL 0.6  Alkaline Phos 38 - 126 U/L -  AST 10 - 35 U/L 18  ALT 6 - 29 U/L 29    CBC Latest Ref Rng & Units 06/06/2021  WBC 4.0 - 10.5 K/uL 10.0  Hemoglobin 12.0 - 15.0 g/dL 14.1  Hematocrit 36.0 - 46.0 % 40.7  Platelets 150 - 400 K/uL 229      Assessment and plan- Patient is a 50 y.o. female with history of iron B12 deficiency anemia here for routine follow-up  Patient is not currently anemic and her hemoglobin is remained stable between 13-14 for the last 1 year.  Her ferritin levels are normal at 78.  She is on monthly B12 injections due to history of intrinsic factor antibody.  She will continue to receive B12 injections on a monthly basis at the [facility].  We will check labs in 6 months in 1 year and I will see her back in 1 year   Visit Diagnosis 1. B12 deficiency   2. Iron deficiency anemia, unspecified iron deficiency anemia type      Dr. Randa Evens, MD, MPH [facility] at [facility] [phone] 06/08/2021 9:03 AM

## 2021-06-09 ENCOUNTER — Ambulatory Visit: Payer: Medicare Other

## 2021-06-12 ENCOUNTER — Other Ambulatory Visit: Payer: Self-pay

## 2021-06-12 ENCOUNTER — Ambulatory Visit (INDEPENDENT_AMBULATORY_CARE_PROVIDER_SITE_OTHER): Payer: Medicare Other

## 2021-06-12 DIAGNOSIS — Z3042 Encounter for surveillance of injectable contraceptive: Secondary | ICD-10-CM

## 2021-06-12 MED ORDER — MEDROXYPROGESTERONE ACETATE 150 MG/ML IM SUSP
150.0000 mg | Freq: Once | INTRAMUSCULAR | Status: AC
  Administered 2021-06-12: 150 mg via INTRAMUSCULAR

## 2021-06-12 NOTE — Progress Notes (Signed)
Date last pap: 09/27/2020. Last Depo-Provera: .03/24/2021 Side Effects if any: none. Serum HCG indicated? N/A. Depo-Provera 150 mg IM given by: F.Tamyra Fojtik, LPN. Next appointment due September 12-26, 2022.    Depo injection provided by patient.   Patient has an appointment for annual exam 09/28/2021 at 11am.

## 2021-06-16 ENCOUNTER — Other Ambulatory Visit: Payer: Self-pay

## 2021-06-16 ENCOUNTER — Ambulatory Visit
Admission: RE | Admit: 2021-06-16 | Discharge: 2021-06-16 | Disposition: A | Discharge status: Home / Self Care | Payer: Medicare Other | Source: Ambulatory Visit | Attending: Sports Medicine | Admitting: Sports Medicine

## 2021-06-16 DIAGNOSIS — S83241A Other tear of medial meniscus, current injury, right knee, initial encounter: Secondary | ICD-10-CM | POA: Insufficient documentation

## 2021-06-16 DIAGNOSIS — M1711 Unilateral primary osteoarthritis, right knee: Secondary | ICD-10-CM

## 2021-06-16 DIAGNOSIS — G8929 Other chronic pain: Secondary | ICD-10-CM | POA: Diagnosis not present

## 2021-06-16 DIAGNOSIS — M25561 Pain in right knee: Secondary | ICD-10-CM | POA: Insufficient documentation

## 2021-06-16 DIAGNOSIS — W19XXXA Unspecified fall, initial encounter: Secondary | ICD-10-CM | POA: Insufficient documentation

## 2021-06-16 DIAGNOSIS — M76891 Other specified enthesopathies of right lower limb, excluding foot: Secondary | ICD-10-CM | POA: Diagnosis not present

## 2021-06-16 DIAGNOSIS — M25461 Effusion, right knee: Secondary | ICD-10-CM | POA: Diagnosis not present

## 2021-06-16 DIAGNOSIS — M2391 Unspecified internal derangement of right knee: Secondary | ICD-10-CM

## 2021-06-20 DIAGNOSIS — Z20822 Contact with and (suspected) exposure to covid-19: Secondary | ICD-10-CM | POA: Diagnosis not present

## 2021-06-21 DIAGNOSIS — S83231A Complex tear of medial meniscus, current injury, right knee, initial encounter: Secondary | ICD-10-CM | POA: Diagnosis not present

## 2021-06-30 ENCOUNTER — Other Ambulatory Visit: Payer: Self-pay

## 2021-06-30 ENCOUNTER — Other Ambulatory Visit: Payer: Self-pay | Admitting: Orthopedic Surgery

## 2021-06-30 DIAGNOSIS — B379 Candidiasis, unspecified: Secondary | ICD-10-CM

## 2021-06-30 MED ORDER — NYSTATIN-TRIAMCINOLONE 100000-0.1 UNIT/GM-% EX CREA: 1.0000 "application " | TOPICAL_CREAM | Freq: Two times a day (BID) | CUTANEOUS | 0 refills | Status: DC

## 2021-07-03 ENCOUNTER — Encounter: Payer: Self-pay | Admitting: Orthopedic Surgery

## 2021-07-04 ENCOUNTER — Telehealth: Payer: Medicare Other | Admitting: Physician Assistant

## 2021-07-04 ENCOUNTER — Encounter: Payer: Self-pay | Admitting: Urgent Care

## 2021-07-04 DIAGNOSIS — B9689 Other specified bacterial agents as the cause of diseases classified elsewhere: Secondary | ICD-10-CM | POA: Diagnosis not present

## 2021-07-04 DIAGNOSIS — J019 Acute sinusitis, unspecified: Secondary | ICD-10-CM

## 2021-07-04 MED ORDER — AMOXICILLIN-POT CLAVULANATE 875-125 MG PO TABS: 1.0000 | ORAL_TABLET | Freq: Two times a day (BID) | ORAL | 0 refills | Status: DC

## 2021-07-04 NOTE — Patient Instructions (Signed)
Amanda Webb, thank you for joining Leeanne Rio, PA-C for today's virtual visit.  While this provider is not your primary care provider (PCP), if your PCP is located in our provider database this encounter information will be shared with them immediately following your visit.  Consent: (Patient) Amanda Webb provided verbal consent for this virtual visit at the beginning of the encounter.  Current Medications:  Current Outpatient Medications:    Accu-Chek FastClix Lancets MISC, USE TO CHECK SUGAR UP TO TWICE DAILY AS DIRECTED, Disp: 102 each, Rfl: 5   ACCU-CHEK GUIDE test strip, CHECK BLOOD SUGAR UP TO TWICE DAILY AS DIRECTED, Disp: 200 strip, Rfl: 5   acetaminophen (TYLENOL) 500 MG tablet, Take 1,000 mg by mouth every 6 (six) hours as needed for moderate pain or headache., Disp: , Rfl:    albuterol (VENTOLIN HFA) 108 (90 Base) MCG/ACT inhaler, INHALE 1 TO 2 PUFFS BY MOUTH EVERY 4 TO 6 HOURS AS NEEDED FOR SHORTNESS OF BREATH, Disp: 18 g, Rfl: 2   ARIPiprazole (ABILIFY) 2 MG tablet, Take 2 mg by mouth every morning. , Disp: , Rfl:    atenolol (TENORMIN) 50 MG tablet, TAKE 1 TABLET(50 MG) BY MOUTH DAILY, Disp: 90 tablet, Rfl: 1   azelastine (ASTELIN) 0.1 % nasal spray, Place 1 spray into both nostrils 2 (two) times daily. Use in each nostril as directed, Disp: 30 mL, Rfl: 5   Cetirizine HCl 10 MG CAPS, Take 10 mg by mouth daily with lunch. , Disp: , Rfl:    Cyanocobalamin (B-12 COMPLIANCE INJECTION IJ), Inject 1 Dose as directed every 30 (thirty) days., Disp: , Rfl:    cyclobenzaprine (FLEXERIL) 10 MG tablet, 3 (three) times daily as needed., Disp: , Rfl:    cycloSPORINE (RESTASIS) 0.05 % ophthalmic emulsion, Apply to eye. (Patient not taking: Reported on 07/03/2021), Disp: , Rfl:    fluticasone (FLONASE) 50 MCG/ACT nasal spray, SHAKE LIQUID AND USE 2 SPRAYS IN EACH NOSTRIL EVERY DAY, Disp: 48 g, Rfl: 1   Fluticasone-Umeclidin-Vilant (TRELEGY ELLIPTA) 100-62.5-25 MCG/INH AEPB, Inhale  into the lungs., Disp: , Rfl:    fluvoxaMINE (LUVOX) 100 MG tablet, Take 150 mg by mouth every morning. , Disp: , Rfl:    gabapentin (NEURONTIN) 100 MG capsule, Take 200 mg by mouth 3 (three) times daily. , Disp: , Rfl:    ketoconazole (NIZORAL) 2 % shampoo, APPLY EXTERNALLY 2 TIMES A WEEK, Disp: 120 mL, Rfl: 2   lamoTRIgine (LAMICTAL) 200 MG tablet, Take 200 mg by mouth every morning. , Disp: , Rfl:    levothyroxine (SYNTHROID) 50 MCG tablet, Take 1 tablet (50 mcg total) by mouth daily before breakfast., Disp: 90 tablet, Rfl: 1   medroxyPROGESTERone (DEPO-PROVERA) 150 MG/ML injection, Bring depo injection to the office each visit., Disp: 1 mL, Rfl: 1   Multiple Vitamin (MULTIVITAMIN WITH MINERALS) TABS tablet, Take 1 tablet by mouth daily., Disp: , Rfl:    nystatin-triamcinolone (MYCOLOG II) cream, Apply 1 application topically 2 (two) times daily. For 10-14 days, Disp: 60 g, Rfl: 0   pantoprazole (PROTONIX) 20 MG tablet, TAKE 1 TABLET(20 MG) BY MOUTH DAILY, Disp: 90 tablet, Rfl: 1   QUEtiapine (SEROQUEL) 50 MG tablet, Take by mouth. (Patient not taking: Reported on 07/03/2021), Disp: , Rfl:    SUMAtriptan (IMITREX) 5 MG/ACT nasal spray, Place into the nose. (Patient not taking: Reported on 07/03/2021), Disp: , Rfl:    traZODone (DESYREL) 50 MG tablet, TAKE 1 TO 3 TABLETS BY MOUTH 1 TO 2  HOURS BEFORE BEDTIME, Disp: , Rfl:    tretinoin (RETIN-A) 0.025 % cream, APPLY EXTERNALLY TO THE AFFECTED AREA AT BEDTIME AS NEEDED (Patient taking differently: Apply 1 application topically at bedtime as needed (acne).), Disp: 45 g, Rfl: 2   Medications ordered in this encounter:  No orders of the defined types were placed in this encounter.    *If you need refills on other medications prior to your next appointment, please contact your pharmacy*  Follow-Up: Call back or seek an in-person evaluation if the symptoms worsen or if the condition fails to improve as anticipated.  Other Instructions Please take  antibiotic as directed.  Increase fluid intake.  Use Saline nasal spray.  Take a daily multivitamin..  Place a humidifier in the bedroom.  Please call or return clinic if symptoms are not improving.  Sinusitis Sinusitis is redness, soreness, and swelling (inflammation) of the paranasal sinuses. Paranasal sinuses are air pockets within the bones of your face (beneath the eyes, the middle of the forehead, or above the eyes). In healthy paranasal sinuses, mucus is able to drain out, and air is able to circulate through them by way of your nose. However, when your paranasal sinuses are inflamed, mucus and air can become trapped. This can allow bacteria and other germs to grow and cause infection. Sinusitis can develop quickly and last only a short time (acute) or continue over a long period (chronic). Sinusitis that lasts for more than 12 weeks is considered chronic.  CAUSES  Causes of sinusitis include: Allergies. Structural abnormalities, such as displacement of the cartilage that separates your nostrils (deviated septum), which can decrease the air flow through your nose and sinuses and affect sinus drainage. Functional abnormalities, such as when the small hairs (cilia) that line your sinuses and help remove mucus do not work properly or are not present. SYMPTOMS  Symptoms of acute and chronic sinusitis are the same. The primary symptoms are pain and pressure around the affected sinuses. Other symptoms include: Upper toothache. Earache. Headache. Bad breath. Decreased sense of smell and taste. A cough, which worsens when you are lying flat. Fatigue. Fever. Thick drainage from your nose, which often is green and may contain pus (purulent). Swelling and warmth over the affected sinuses. DIAGNOSIS  Your caregiver will perform a physical exam. During the exam, your caregiver may: Look in your nose for signs of abnormal growths in your nostrils (nasal polyps). Tap over the affected sinus to check  for signs of infection. View the inside of your sinuses (endoscopy) with a special imaging device with a light attached (endoscope), which is inserted into your sinuses. If your caregiver suspects that you have chronic sinusitis, one or more of the following tests may be recommended: Allergy tests. Nasal culture A sample of mucus is taken from your nose and sent to a lab and screened for bacteria. Nasal cytology A sample of mucus is taken from your nose and examined by your caregiver to determine if your sinusitis is related to an allergy. TREATMENT  Most cases of acute sinusitis are related to a viral infection and will resolve on their own within 10 days. Sometimes medicines are prescribed to help relieve symptoms (pain medicine, decongestants, nasal steroid sprays, or saline sprays).  However, for sinusitis related to a bacterial infection, your caregiver will prescribe antibiotic medicines. These are medicines that will help kill the bacteria causing the infection.  Rarely, sinusitis is caused by a fungal infection. In theses cases, your caregiver will prescribe antifungal  medicine. For some cases of chronic sinusitis, surgery is needed. Generally, these are cases in which sinusitis recurs more than 3 times per year, despite other treatments. HOME CARE INSTRUCTIONS  Drink plenty of water. Water helps thin the mucus so your sinuses can drain more easily. Use a humidifier. Inhale steam 3 to 4 times a day (for example, sit in the bathroom with the shower running). Apply a warm, moist washcloth to your face 3 to 4 times a day, or as directed by your caregiver. Use saline nasal sprays to help moisten and clean your sinuses. Take over-the-counter or prescription medicines for pain, discomfort, or fever only as directed by your caregiver. SEEK IMMEDIATE MEDICAL CARE IF: You have increasing pain or severe headaches. You have nausea, vomiting, or drowsiness. You have swelling around your face. You  have vision problems. You have a stiff neck. You have difficulty breathing. MAKE SURE YOU:  Understand these instructions. Will watch your condition. Will get help right away if you are not doing well or get worse. Document Released: 12/03/2005 Document Revised: 02/25/2012 Document Reviewed: 12/18/2011 [facility] Patient Information 2014 [place], [place].    If you have been instructed to have an in-person evaluation today at a local Urgent Care facility, please use the link below. It will take you to a list of all of our available [facility], including address, phone number and hours of operation. Please do not delay care.  [facility]  If you or a family member do not have a primary care provider, use the link below to schedule a visit and establish care. When you choose a [facility] primary care physician or advanced practice provider, you gain a long-term partner in health. Find a Primary Care Provider  Learn more about [facility]'s in-office and virtual care options: [facility]

## 2021-07-04 NOTE — Progress Notes (Signed)
Virtual Visit Consent   Amanda Webb, you are scheduled for a virtual visit with a [facility] provider today.     Just as with appointments in the office, your consent must be obtained to participate.  Your consent will be active for this visit and any virtual visit you may have with one of our providers in the next 365 days.     If you have a MyChart account, a copy of this consent can be sent to you electronically.  All virtual visits are billed to your insurance company just like a traditional visit in the office.    As this is a virtual visit, video technology does not allow for your provider to perform a traditional examination.  This may limit your provider's ability to fully assess your condition.  If your provider identifies any concerns that need to be evaluated in person or the need to arrange testing (such as labs, EKG, etc.), we will make arrangements to do so.     Although advances in technology are sophisticated, we cannot ensure that it will always work on either your end or our end.  If the connection with a video visit is poor, the visit may have to be switched to a telephone visit.  With either a video or telephone visit, we are not always able to ensure that we have a secure connection.     I need to obtain your verbal consent now.   Are you willing to proceed with your visit today?    Amanda Webb has provided verbal consent on 07/04/2021 for a virtual visit (video or telephone).   Leeanne Rio, [place]   Date: 07/04/2021 5:11 PM   Virtual Visit via Video Note   I, Leeanne Rio, connected with  Amanda Webb  ([id number], 07-01-1971) on 07/04/21 at  5:00 PM EDT by a video-enabled telemedicine application and verified that I am speaking with the correct person using two identifiers.  Location: Patient: Virtual Visit Location Patient: Home Provider: Virtual Visit Location Provider: Home Office   I discussed the limitations of evaluation and management by  telemedicine and the availability of in person appointments. The patient expressed understanding and agreed to proceed.    History of Present Illness: Amanda Webb is a 50 y.o. who identifies as a female who was assigned female at birth, and is being seen today for possible sinuitis. Notes 1 weeks of nasal congestion and maxillary sinus pain. Notes some ear pain. Tmax 99.4. Denies chest congestion or SOB. Denies recent travel or sick contact. Has not taken anything yet for her symptoms. Is scheduled for knee surgery next week.   HPI: HPI  Problems:  Patient Active Problem List   Diagnosis Date Noted   Major depressive disorder, recurrent, in partial remission ([place]) 11/08/2020   Bronchitis 10/21/2020   Cough 10/21/2020   Acute non-recurrent sinusitis 08/24/2020   Status post laparoscopic cholecystectomy 07/28/2020   Morbid obesity ([place]) 07/05/2020   Hepatic steatosis 06/23/2020   Lymphedema 03/09/2020   Chronic venous insufficiency 03/09/2020   Bilateral lower extremity edema 02/16/2020   Female cystocele 10/21/2018   Urinary incontinence, mixed 10/21/2018   B12 deficiency 04/08/2018   Iron deficiency anemia 03/14/2018   GAD (generalized anxiety disorder) 03/10/2018   Acute right-sided low back pain 10/28/2017   Tobacco abuse 05/09/2017   Multiple thyroid nodules 12/28/2016   Simple chronic bronchitis ([place]) 12/13/2016   BMI 40.0-44.9, adult ([place]) 09/26/2016   HLD (hyperlipidemia) 09/26/2016  Vitamin D deficiency 09/26/2016   Pre-diabetes 06/17/2015   Candidal intertrigo 02/21/2015   Candidiasis of skin and nail 02/21/2015   Cellulitis and abscess of leg 02/18/2015   Benign cyst of right kidney 01/17/2015   Wheezing 01/11/2015   Chronic sinusitis 11/15/2014   Pneumonia due to infectious organism 11/02/2014   Whiplash injury 09/03/2014   Heart palpitations 04/02/2014   Lower urinary tract infectious disease 02/16/2014   Allergic rhinitis 12/31/2013   Spondylosis of lumbar  region without myelopathy or radiculopathy 12/28/2013   Diarrhea 11/09/2013   Chronic low back pain 09/28/2013   Other fatigue 09/28/2013   Hypothyroidism 09/28/2013   Alteration of body temperature 09/28/2013   Plantar fasciitis, bilateral 07/10/2013   Tarsal tunnel syndrome of right side 07/10/2013   Plantar fascial fibromatosis 07/10/2013   Encounter for surveillance of injectable contraceptive 05/13/2013   Encounter for Depo-Provera contraception 05/13/2013   Diuresis excessive 03/24/2013   Abnormal uterine and vaginal bleeding, unspecified 03/24/2013   Polyuria 03/24/2013   Arthritis, multiple joint involvement 03/17/2013   Fibroblastic disorder 07/28/2012   Borderline personality disorder ([place]) 12/17/2010   Essential hypertension 02/18/2010   Carpal tunnel syndrome on both sides 12/17/2006   Major depressive disorder, single episode 02/21/1998   Disk prolapse 04/17/1995   Depression with anxiety 03/18/1995   OCD (obsessive compulsive disorder) 03/18/1995   Severe anxiety with panic 03/18/1995    Allergies:  Allergies  Allergen Reactions   Bee Venom Anaphylaxis, Hives and Swelling    Carries Epi pen.    Cat Hair Extract Itching, Other (See Comments) and Swelling    Allergic to trees, nuts, wheat, grass, cats & dogs - itchy watery eyes, swelling. Uses Zyrtec & Flonase & Benadryl if really bad. Used to get allergy shots. Allergic to trees, nuts, wheat, grass, cats & dogs - itchy watery eyes, swelling. Uses Zyrtec & Flonase & Benadryl if really bad. Used to get allergy shots.   Dog Epithelium Cough and Shortness Of Breath   Dog Epithelium Allergy Skin Test Shortness Of Breath   Dog Fennel Cough and Shortness Of Breath   Dog Fennel Allergy Skin Test Shortness Of Breath   Dust Mite Extract Cough and Shortness Of Breath   Tetracyclines & Related Nausea And Vomiting   Lac Bovis Diarrhea and Nausea And Vomiting   Lactose Diarrhea and Nausea And Vomiting   Milk Protein Diarrhea  and Nausea And Vomiting   Tape Other (See Comments) and Rash    Needs to use paper tape. Breaks out with severe rash, pulls skin off when using adhesive.   Medications:  Current Outpatient Medications:    amoxicillin-clavulanate (AUGMENTIN) 875-125 MG tablet, Take 1 tablet by mouth 2 (two) times daily., Disp: 14 tablet, Rfl: 0   Accu-Chek FastClix Lancets MISC, USE TO CHECK SUGAR UP TO TWICE DAILY AS DIRECTED, Disp: 102 each, Rfl: 5   ACCU-CHEK GUIDE test strip, CHECK BLOOD SUGAR UP TO TWICE DAILY AS DIRECTED, Disp: 200 strip, Rfl: 5   acetaminophen (TYLENOL) 500 MG tablet, Take 1,000 mg by mouth every 6 (six) hours as needed for moderate pain or headache., Disp: , Rfl:    albuterol (VENTOLIN HFA) 108 (90 Base) MCG/ACT inhaler, INHALE 1 TO 2 PUFFS BY MOUTH EVERY 4 TO 6 HOURS AS NEEDED FOR SHORTNESS OF BREATH, Disp: 18 g, Rfl: 2   ARIPiprazole (ABILIFY) 2 MG tablet, Take 2 mg by mouth every morning. , Disp: , Rfl:    atenolol (TENORMIN) 50 MG tablet, TAKE 1 TABLET(50  MG) BY MOUTH DAILY, Disp: 90 tablet, Rfl: 1   azelastine (ASTELIN) 0.1 % nasal spray, Place 1 spray into both nostrils 2 (two) times daily. Use in each nostril as directed, Disp: 30 mL, Rfl: 5   Cetirizine HCl 10 MG CAPS, Take 10 mg by mouth daily with lunch. , Disp: , Rfl:    Cyanocobalamin (B-12 COMPLIANCE INJECTION IJ), Inject 1 Dose as directed every 30 (thirty) days., Disp: , Rfl:    cyclobenzaprine (FLEXERIL) 10 MG tablet, 3 (three) times daily as needed., Disp: , Rfl:    cycloSPORINE (RESTASIS) 0.05 % ophthalmic emulsion, Apply to eye. (Patient not taking: Reported on 07/03/2021), Disp: , Rfl:    fluticasone (FLONASE) 50 MCG/ACT nasal spray, SHAKE LIQUID AND USE 2 SPRAYS IN EACH NOSTRIL EVERY DAY, Disp: 48 g, Rfl: 1   Fluticasone-Umeclidin-Vilant (TRELEGY ELLIPTA) 100-62.5-25 MCG/INH AEPB, Inhale into the lungs., Disp: , Rfl:    fluvoxaMINE (LUVOX) 100 MG tablet, Take 150 mg by mouth every morning. , Disp: , Rfl:    gabapentin  (NEURONTIN) 100 MG capsule, Take 200 mg by mouth 3 (three) times daily. , Disp: , Rfl:    ketoconazole (NIZORAL) 2 % shampoo, APPLY EXTERNALLY 2 TIMES A WEEK, Disp: 120 mL, Rfl: 2   lamoTRIgine (LAMICTAL) 200 MG tablet, Take 200 mg by mouth every morning. , Disp: , Rfl:    levothyroxine (SYNTHROID) 50 MCG tablet, Take 1 tablet (50 mcg total) by mouth daily before breakfast., Disp: 90 tablet, Rfl: 1   medroxyPROGESTERone (DEPO-PROVERA) 150 MG/ML injection, Bring depo injection to the office each visit., Disp: 1 mL, Rfl: 1   Multiple Vitamin (MULTIVITAMIN WITH MINERALS) TABS tablet, Take 1 tablet by mouth daily., Disp: , Rfl:    nystatin-triamcinolone (MYCOLOG II) cream, Apply 1 application topically 2 (two) times daily. For 10-14 days, Disp: 60 g, Rfl: 0   pantoprazole (PROTONIX) 20 MG tablet, TAKE 1 TABLET(20 MG) BY MOUTH DAILY, Disp: 90 tablet, Rfl: 1   QUEtiapine (SEROQUEL) 50 MG tablet, Take by mouth. (Patient not taking: Reported on 07/03/2021), Disp: , Rfl:    SUMAtriptan (IMITREX) 5 MG/ACT nasal spray, Place into the nose. (Patient not taking: Reported on 07/03/2021), Disp: , Rfl:    traZODone (DESYREL) 50 MG tablet, TAKE 1 TO 3 TABLETS BY MOUTH 1 TO 2 HOURS BEFORE BEDTIME, Disp: , Rfl:    tretinoin (RETIN-A) 0.025 % cream, APPLY EXTERNALLY TO THE AFFECTED AREA AT BEDTIME AS NEEDED (Patient taking differently: Apply 1 application topically at bedtime as needed (acne).), Disp: 45 g, Rfl: 2  Observations/Objective: Patient is well-developed, well-nourished in no acute distress.  Resting comfortably at home.  Head is normocephalic, atraumatic.  No labored breathing. Speech is clear and coherent with logical content.  Patient is alert and oriented at baseline.  + TTP max sinuses  Assessment and Plan: 1. Acute bacterial sinusitis - amoxicillin-clavulanate (AUGMENTIN) 875-125 MG tablet; Take 1 tablet by mouth 2 (two) times daily.  Dispense: 14 tablet; Refill: 0 Rx Augmentin.  Increase fluids.   Rest.  Saline nasal spray.  Probiotic.  Mucinex as directed.  Humidifier in bedroom.    Follow Up Instructions: I discussed the assessment and treatment plan with the patient. The patient was provided an opportunity to ask questions and all were answered. The patient agreed with the plan and demonstrated an understanding of the instructions.  A copy of instructions were sent to the patient via MyChart.  The patient was advised to call back or seek an in-person evaluation  if the symptoms worsen or if the condition fails to improve as anticipated.  Time:  I spent 12 minutes with the patient via telehealth technology discussing the above problems/concerns.    Leeanne Rio, PA-C

## 2021-07-07 DIAGNOSIS — F4310 Post-traumatic stress disorder, unspecified: Secondary | ICD-10-CM | POA: Diagnosis not present

## 2021-07-11 ENCOUNTER — Ambulatory Visit
Admission: RE | Admit: 2021-07-11 | Discharge: 2021-07-11 | Disposition: A | Discharge status: Home / Self Care | Payer: Medicare Other | Attending: Orthopedic Surgery | Admitting: Orthopedic Surgery

## 2021-07-11 ENCOUNTER — Encounter: Payer: Self-pay | Admitting: Orthopedic Surgery

## 2021-07-11 ENCOUNTER — Ambulatory Visit: Payer: Medicare Other | Admitting: Anesthesiology

## 2021-07-11 ENCOUNTER — Encounter
Admission: RE | Disposition: A | Discharge status: Home / Self Care | Payer: Self-pay | Source: Home / Self Care | Attending: Orthopedic Surgery

## 2021-07-11 ENCOUNTER — Other Ambulatory Visit: Payer: Self-pay

## 2021-07-11 DIAGNOSIS — Z7951 Long term (current) use of inhaled steroids: Secondary | ICD-10-CM | POA: Diagnosis not present

## 2021-07-11 DIAGNOSIS — X58XXXA Exposure to other specified factors, initial encounter: Secondary | ICD-10-CM | POA: Insufficient documentation

## 2021-07-11 DIAGNOSIS — Z91030 Bee allergy status: Secondary | ICD-10-CM | POA: Diagnosis not present

## 2021-07-11 DIAGNOSIS — Z79890 Hormone replacement therapy (postmenopausal): Secondary | ICD-10-CM | POA: Diagnosis not present

## 2021-07-11 DIAGNOSIS — Z79899 Other long term (current) drug therapy: Secondary | ICD-10-CM | POA: Insufficient documentation

## 2021-07-11 DIAGNOSIS — F17210 Nicotine dependence, cigarettes, uncomplicated: Secondary | ICD-10-CM | POA: Diagnosis not present

## 2021-07-11 DIAGNOSIS — S83231A Complex tear of medial meniscus, current injury, right knee, initial encounter: Secondary | ICD-10-CM | POA: Insufficient documentation

## 2021-07-11 DIAGNOSIS — Z91048 Other nonmedicinal substance allergy status: Secondary | ICD-10-CM | POA: Diagnosis not present

## 2021-07-11 DIAGNOSIS — M6751 Plica syndrome, right knee: Secondary | ICD-10-CM | POA: Insufficient documentation

## 2021-07-11 DIAGNOSIS — Z9109 Other allergy status, other than to drugs and biological substances: Secondary | ICD-10-CM | POA: Diagnosis not present

## 2021-07-11 DIAGNOSIS — Z881 Allergy status to other antibiotic agents status: Secondary | ICD-10-CM | POA: Insufficient documentation

## 2021-07-11 DIAGNOSIS — S83241A Other tear of medial meniscus, current injury, right knee, initial encounter: Secondary | ICD-10-CM | POA: Diagnosis not present

## 2021-07-11 DIAGNOSIS — X501XXA Overexertion from prolonged static or awkward postures, initial encounter: Secondary | ICD-10-CM | POA: Diagnosis not present

## 2021-07-11 DIAGNOSIS — W101XXA Fall (on)(from) sidewalk curb, initial encounter: Secondary | ICD-10-CM | POA: Insufficient documentation

## 2021-07-11 HISTORY — PX: KNEE ARTHROSCOPY WITH MEDIAL MENISECTOMY: SHX5651

## 2021-07-11 HISTORY — DX: Dyspnea, unspecified: R06.00

## 2021-07-11 LAB — POCT PREGNANCY, URINE: Preg Test, Ur: NEGATIVE

## 2021-07-11 SURGERY — ARTHROSCOPY, KNEE, WITH MEDIAL MENISCECTOMY
Anesthesia: General | Site: Knee | Laterality: Right

## 2021-07-11 MED ORDER — ACETAMINOPHEN 325 MG PO TABS: 325.0000 mg | ORAL_TABLET | ORAL | Status: DC | PRN

## 2021-07-11 MED ORDER — PROPOFOL 10 MG/ML IV BOLUS
INTRAVENOUS | Status: DC | PRN
  Administered 2021-07-11: 200 mg via INTRAVENOUS

## 2021-07-11 MED ORDER — ONDANSETRON HCL 4 MG/2ML IJ SOLN
INTRAMUSCULAR | Status: DC | PRN
  Administered 2021-07-11: 4 mg via INTRAVENOUS

## 2021-07-11 MED ORDER — CEFAZOLIN SODIUM-DEXTROSE 2-3 GM-%(50ML) IV SOLR
INTRAVENOUS | Status: DC | PRN
  Administered 2021-07-11: 2 g via INTRAVENOUS

## 2021-07-11 MED ORDER — FENTANYL CITRATE PF 50 MCG/ML IJ SOSY: 25.0000 ug | PREFILLED_SYRINGE | INTRAMUSCULAR | Status: DC | PRN

## 2021-07-11 MED ORDER — ONDANSETRON 4 MG PO TBDP: 4.0000 mg | ORAL_TABLET | Freq: Three times a day (TID) | ORAL | 0 refills | Status: DC | PRN

## 2021-07-11 MED ORDER — LIDOCAINE HCL (CARDIAC) PF 100 MG/5ML IV SOSY
PREFILLED_SYRINGE | INTRAVENOUS | Status: DC | PRN
  Administered 2021-07-11: 40 mg via INTRATRACHEAL

## 2021-07-11 MED ORDER — IBUPROFEN 800 MG PO TABS: 800.0000 mg | ORAL_TABLET | Freq: Three times a day (TID) | ORAL | 1 refills | Status: AC

## 2021-07-11 MED ORDER — ACETAMINOPHEN 160 MG/5ML PO SOLN: 325.0000 mg | ORAL | Status: DC | PRN

## 2021-07-11 MED ORDER — ACETAMINOPHEN 500 MG PO TABS: 1000.0000 mg | ORAL_TABLET | Freq: Three times a day (TID) | ORAL | 2 refills | Status: DC

## 2021-07-11 MED ORDER — HYDROCODONE-ACETAMINOPHEN 5-325 MG PO TABS: 1.0000 | ORAL_TABLET | ORAL | 0 refills | Status: DC | PRN

## 2021-07-11 MED ORDER — ASPIRIN EC 325 MG PO TBEC: 325.0000 mg | DELAYED_RELEASE_TABLET | Freq: Every day | ORAL | 0 refills | Status: AC

## 2021-07-11 MED ORDER — OXYCODONE HCL 5 MG/5ML PO SOLN: 5.0000 mg | Freq: Once | ORAL | Status: AC | PRN

## 2021-07-11 MED ORDER — DEXAMETHASONE SODIUM PHOSPHATE 4 MG/ML IJ SOLN
INTRAMUSCULAR | Status: DC | PRN
  Administered 2021-07-11: 4 mg via INTRAVENOUS

## 2021-07-11 MED ORDER — ONDANSETRON HCL 4 MG/2ML IJ SOLN
4.0000 mg | Freq: Once | INTRAMUSCULAR | Status: AC
  Administered 2021-07-11: 4 mg via INTRAVENOUS

## 2021-07-11 MED ORDER — MIDAZOLAM HCL 5 MG/5ML IJ SOLN
INTRAMUSCULAR | Status: DC | PRN
  Administered 2021-07-11: 2 mg via INTRAVENOUS

## 2021-07-11 MED ORDER — FENTANYL CITRATE (PF) 100 MCG/2ML IJ SOLN
INTRAMUSCULAR | Status: DC | PRN
  Administered 2021-07-11 (×4): 25 ug via INTRAVENOUS
  Administered 2021-07-11 (×2): 50 ug via INTRAVENOUS

## 2021-07-11 MED ORDER — LACTATED RINGERS IV SOLN: INTRAVENOUS | Status: DC

## 2021-07-11 MED ORDER — KETOROLAC TROMETHAMINE 30 MG/ML IJ SOLN
INTRAMUSCULAR | Status: DC | PRN
  Administered 2021-07-11: 30 mg via INTRAVENOUS

## 2021-07-11 MED ORDER — CEFAZOLIN SODIUM-DEXTROSE 2-4 GM/100ML-% IV SOLN
2.0000 g | INTRAVENOUS | Status: AC
  Administered 2021-07-11: 2 g via INTRAVENOUS

## 2021-07-11 MED ORDER — OXYCODONE HCL 5 MG PO TABS
5.0000 mg | ORAL_TABLET | Freq: Once | ORAL | Status: AC | PRN
  Administered 2021-07-11: 10 mg via ORAL

## 2021-07-11 MED ORDER — GLYCOPYRROLATE 0.2 MG/ML IJ SOLN
INTRAMUSCULAR | Status: DC | PRN
Start: 2021-07-11 — End: 2021-07-11
  Administered 2021-07-11: .1 mg via INTRAVENOUS

## 2021-07-11 MED ORDER — LIDOCAINE-EPINEPHRINE 1 %-1:100000 IJ SOLN
INTRAMUSCULAR | Status: DC | PRN
  Administered 2021-07-11: 12 mL via INTRAMUSCULAR

## 2021-07-11 SURGICAL SUPPLY — 36 items
ADAPTER IRRIG TUBE 2 SPIKE SOL (ADAPTER) ×2 IMPLANT
ADPR TBG 2 SPK PMP STRL ASCP (ADAPTER) ×1
APL PRP STRL LF DISP 70% ISPRP (MISCELLANEOUS) ×1
BLADE SURG SZ11 CARB STEEL (BLADE) ×2 IMPLANT
BNDG COHESIVE 4X5 TAN STRL (GAUZE/BANDAGES/DRESSINGS) ×2 IMPLANT
BNDG ESMARK 6X12 TAN STRL LF (GAUZE/BANDAGES/DRESSINGS) ×2 IMPLANT
BUR RADIUS 4.0X18.5 (BURR) ×2 IMPLANT
CHLORAPREP W/TINT 26 (MISCELLANEOUS) ×2 IMPLANT
COOLER POLAR GLACIER W/PUMP (MISCELLANEOUS) ×2 IMPLANT
COVER LIGHT HANDLE UNIVERSAL (MISCELLANEOUS) ×4 IMPLANT
CUFF TOURN SGL QUICK 30 (TOURNIQUET CUFF) ×2
CUFF TRNQT CYL 30X4X21-28X (TOURNIQUET CUFF) ×1 IMPLANT
DRAPE IMP U-DRAPE 54X76 (DRAPES) ×2 IMPLANT
GAUZE SPONGE 4X4 12PLY STRL (GAUZE/BANDAGES/DRESSINGS) ×2 IMPLANT
GLOVE SRG 8 PF TXTR STRL LF DI (GLOVE) ×1 IMPLANT
GLOVE SURG ENC MOIS LTX SZ7.5 (GLOVE) ×2 IMPLANT
GLOVE SURG UNDER POLY LF SZ8 (GLOVE) ×2
GOWN STRL REUS W/ TWL LRG LVL3 (GOWN DISPOSABLE) ×1 IMPLANT
GOWN STRL REUS W/TWL LRG LVL3 (GOWN DISPOSABLE) ×2
GOWN STRL REUS W/TWL XL LVL3 (GOWN DISPOSABLE) ×2 IMPLANT
IV LACTATED RINGER IRRG 3000ML (IV SOLUTION) ×4
IV LR IRRIG 3000ML ARTHROMATIC (IV SOLUTION) ×2 IMPLANT
KIT TURNOVER KIT A (KITS) ×2 IMPLANT
MANIFOLD NEPTUNE II (INSTRUMENTS) ×2 IMPLANT
MAT ABSORB  FLUID 56X50 GRAY (MISCELLANEOUS) ×1
MAT ABSORB FLUID 56X50 GRAY (MISCELLANEOUS) ×1 IMPLANT
PACK ARTHROSCOPY KNEE (MISCELLANEOUS) ×2 IMPLANT
PAD ABD DERMACEA PRESS 5X9 (GAUZE/BANDAGES/DRESSINGS) ×2 IMPLANT
PAD WRAPON POLAR KNEE (MISCELLANEOUS) ×1 IMPLANT
SET TUBE SUCT SHAVER OUTFL 24K (TUBING) ×2 IMPLANT
SUT ETHILON 3-0 FS-10 30 BLK (SUTURE) ×2
SUTURE EHLN 3-0 FS-10 30 BLK (SUTURE) ×1 IMPLANT
TOWEL OR 17X26 4PK STRL BLUE (TOWEL DISPOSABLE) ×4 IMPLANT
TUBING ARTHRO INFLOW-ONLY STRL (TUBING) ×2 IMPLANT
WAND WEREWOLF FLOW 90D (MISCELLANEOUS) ×2 IMPLANT
WRAPON POLAR PAD KNEE (MISCELLANEOUS) ×2

## 2021-07-11 NOTE — Transfer of Care (Signed)
Immediate Anesthesia Transfer of Care Note  Patient: Amanda Webb  Procedure(s) Performed: Right knee arthroscopic partial medial meniscectomy (Right: Knee)  Patient Location: PACU  Anesthesia Type: General  Level of Consciousness: awake, alert  and patient cooperative  Airway and Oxygen Therapy: Patient Spontanous Breathing and Patient connected to supplemental oxygen  Post-op Assessment: Post-op Vital signs reviewed, Patient's Cardiovascular Status Stable, Respiratory Function Stable, Patent Airway and No signs of Nausea or vomiting  Post-op Vital Signs: Reviewed and stable  Complications: No notable events documented.

## 2021-07-11 NOTE — Discharge Instructions (Signed)
Arthroscopic Knee Surgery - Partial Meniscectomy   Post-Op Instructions   1. Bracing or crutches: Crutches will be provided at the time of discharge from the surgery center if you do not already have them.   2. Ice: You may be provided with a device (Polar Care) that allows you to ice the affected area effectively. Otherwise you can ice manually.    3. Driving:  Plan on not driving for at least two weeks. Please note that you are advised NOT to drive while taking narcotic pain medications as you may be impaired and unsafe to drive.   4. Activity: Ankle pumps several times an hour while awake to prevent blood clots. Weight bearing: as tolerated. Use crutches for as needed (usually ~1 week or less) until pain allows you to ambulate without a limp. Bending and straightening the knee is unlimited. Elevate knee above heart level as much as possible for one week. Avoid standing more than 5 minutes (consecutively) for the first week.  Avoid long distance travel for 2 weeks.  5. Medications:  - You have been provided a prescription for narcotic pain medicine. After surgery, take 1-2 narcotic tablets every 4 hours if needed for severe pain.  - You may take up to 3000mg/day of tylenol (acetaminophen). You can take 1000mg 3x/day. Please check your narcotic. If you have acetaminophen in your narcotic (each tablet will be 325mg), be careful not to exceed a total of 3000mg/day of acetaminophen.  - A prescription for anti-nausea medication will be provided in case the narcotic medicine or anesthesia causes nausea - take 1 tablet every 6 hours only if nauseated.  - Take ibuprofen 800 mg every 8 hours WITH food to reduce post-operative knee swelling. DO NOT STOP IBUPROFEN POST-OP UNTIL INSTRUCTED TO DO SO at first post-op office visit (10-14 days after surgery). However, please discontinue if you have any abdominal discomfort after taking this.  - Take enteric coated aspirin 325 mg once daily for 2 weeks to prevent  blood clots.    6. Bandages: The physical therapist should change the bandages at the first post-op appointment. If needed, the dressing supplies have been provided to you.   7. Physical Therapy: 1-2 times per week for 6 weeks. Therapy typically starts on post operative Day 3 or 4. You have been provided an order for physical therapy. The therapist will provide home exercises.   8. Work: May return to full work usually around 2 weeks after 1st post-operative visit. May do light duty/desk job in approximately 1-2 weeks when off of narcotics, pain is well-controlled, and swelling has decreased. Labor intensive jobs may require 4-6 weeks to return.      9. Post-Op Appointments: Your first post-op appointment will be with Dr. Taiwan Millon in approximately 2 weeks time.    If you find that they have not been scheduled please call the [facility] Appoint[facility] front desk at [phone].  

## 2021-07-11 NOTE — H&P (Signed)
Paper H&P to be scanned into permanent record. H&P reviewed. No significant changes noted.  

## 2021-07-11 NOTE — Anesthesia Postprocedure Evaluation (Signed)
Anesthesia Post Note  Patient: Amanda Webb  Procedure(s) Performed: Right knee arthroscopic partial medial meniscectomy (Right: Knee)     Patient location during evaluation: PACU Anesthesia Type: General Level of consciousness: awake Pain management: pain level controlled Vital Signs Assessment: post-procedure vital signs reviewed and stable Respiratory status: respiratory function stable Cardiovascular status: stable Postop Assessment: no signs of nausea or vomiting Anesthetic complications: no   No notable events documented.  Veda Canning

## 2021-07-11 NOTE — Anesthesia Preprocedure Evaluation (Signed)
Anesthesia Evaluation  Patient identified by MRN, date of birth, ID band Patient awake    Reviewed: Allergy & Precautions, H&P , NPO status   History of Anesthesia Complications Negative for: history of anesthetic complications  Airway Mallampati: III  TM Distance: <3 FB Neck ROM: full    Dental  (+) Chipped, Poor Dentition   Pulmonary sleep apnea , Current Smoker and Patient abstained from smoking.,    Pulmonary exam normal        Cardiovascular Exercise Tolerance: Good hypertension, Normal cardiovascular exam+ dysrhythmias + Valvular Problems/Murmurs      Neuro/Psych  Headaches, PSYCHIATRIC DISORDERS Anxiety Depression  Neuromuscular disease    GI/Hepatic GERD  Medicated,  Endo/Other  Hypothyroidism Morbid obesity (BMI > 40)  Renal/GU Renal disease     Musculoskeletal  (+) Arthritis ,   Abdominal   Peds  Hematology   Anesthesia Other Findings    Reproductive/Obstetrics                             Anesthesia Physical  Anesthesia Plan  ASA: 3  Anesthesia Plan: General   Post-op Pain Management:    Induction: Intravenous  PONV Risk Score and Plan: 2 and Ondansetron, Dexamethasone, Midazolam and Treatment may vary due to age or medical condition  Airway Management Planned: LMA  Additional Equipment:   Intra-op Plan:   Post-operative Plan:   Informed Consent: I have reviewed the patients History and Physical, chart, labs and discussed the procedure including the risks, benefits and alternatives for the proposed anesthesia with the patient or authorized representative who has indicated his/her understanding and acceptance.     Dental Advisory Given  Plan Discussed with: CRNA  Anesthesia Plan Comments:         Anesthesia Quick Evaluation

## 2021-07-11 NOTE — Anesthesia Procedure Notes (Signed)
Procedure Name: LMA Insertion Date/Time: 07/11/2021 12:24 PM Performed by: Mayme Genta, CRNA Pre-anesthesia Checklist: Patient identified, Emergency Drugs available, Suction available, Timeout performed and Patient being monitored Patient Re-evaluated:Patient Re-evaluated prior to induction Oxygen Delivery Method: Circle system utilized Preoxygenation: Pre-oxygenation with 100% oxygen Induction Type: IV induction LMA: LMA inserted LMA Size: 4.0 Number of attempts: 1 Placement Confirmation: positive ETCO2 and breath sounds checked- equal and bilateral Tube secured with: Tape

## 2021-07-11 NOTE — Op Note (Signed)
Operative Note    SURGERY DATE: 07/11/2021   PRE-OP DIAGNOSIS:  1. Right medial meniscus tear   POST-OP DIAGNOSIS:  1. Right medial meniscus tear 2. Right knee medial plica band   PROCEDURES:  1. Right knee arthroscopy, partial medial meniscectomy 2. Right knee partial synovectomy with medial plica excision  SURGEON: Cato Mulligan, MD   ANESTHESIA: Gen   ESTIMATED BLOOD LOSS: minimal   TOTAL IV FLUIDS: per anesthesia   INDICATION(S):  CAILYNNE Webb is a 50 y.o. female with signs and symptoms as well as MRI finding of medial meniscus tear. She had failed nonoperative management for over 7 months including NSAIDs and activity modifications. After discussion of risks, benefits, and alternatives to surgery, the patient elected to proceed.   OPERATIVE FINDINGS:    Examination under anesthesia: A careful examination under anesthesia was performed.  Passive range of motion was: Hyperextension: 1.  Extension: 0.  Flexion: 120.  Lachman: normal. Pivot Shift: normal.  Posterior drawer: normal.  Varus stability in full extension: normal.  Varus stability in 30 degrees of flexion: normal.  Valgus stability in full extension: normal.  Valgus stability in 30 degrees of flexion: normal.   Intra-operative findings: A thorough arthroscopic examination of the knee was performed.  The findings are: 1. Suprapatellar pouch: Normal 2. Undersurface of median ridge: Grade 2 degenerative changes 3. Medial patellar facet: Extension of grade 2 degenerative changes for median ridge, 4. Lateral patellar facet: Grade 1 softening 5. Trochlea: Normal 6. Lateral gutter/popliteus tendon: Normal 7. Hoffa's fat pad: Inflamed 8. Medial gutter/plica: Prominent medial plica band abutting the medial femoral condyle 9. ACL: Normal 10. PCL: Normal 11. Medial meniscus: Complex tear with primarily horizontal tear pattern with parrot-beak component at the posterior horn/body junction with flipped fragment into the  tibial recess.  Parrot-beak component involved approximately 50% of the meniscus width. 12. Medial compartment cartilage: Grade 1 degenerative changes to the tibial plateau and femoral condyle 13. Lateral meniscus: Normal 14. Lateral compartment cartilage: Normal   OPERATIVE REPORT:     I identified Amanda Webb in the pre-operative holding area. I marked the operative knee with my initials. I reviewed the risks and benefits of the proposed surgical intervention and the patient wished to proceed. The patient was transferred to the operative suite and placed in the supine position with all bony prominences padded.  Anesthesia was administered. Appropriate IV antibiotics were administered prior to incision. The extremity was then prepped and draped in standard fashion. A time out was performed confirming the correct extremity, correct patient, and correct procedure.   Arthroscopy portals were marked. Local anesthetic was injected to the planned portal sites. The anterolateral portal was established with an 11 blade.      The arthroscope was placed in the anterolateral portal and then into the suprapatellar pouch. Next, the medial portal was established under needle localization. A diagnostic knee scope was completed with the above findings. The medial meniscus tear was identified.   The MCL was pie-crusted to improve visualization of the posterior horn. The meniscal tear was debrided using an arthroscopic biter and an oscillating shaver until the meniscus had stable borders.  A partial synovectomy was performed by debriding the medial plica band using an oscillating shaver such that there was no abrasion along the medial femoral condyle. Arthroscopic fluid was removed from the joint.   The portals were closed with 3-0 Nylon suture. Sterile dressings included Xeroform, 4x4s, Sof-Rol, and Bias wrap. A Polarcare was placed.  The patient was then awakened and taken to the PACU hemodynamically stable  without complication.     POSTOPERATIVE PLAN: The patient will be discharged home today once they meet PACU criteria. Aspirin 325 mg daily was prescribed for 2 weeks for DVT prophylaxis.  Physical therapy will start on POD#3-4. Weight-bearing as tolerated. Follow up in 2 weeks per protocol.

## 2021-07-12 ENCOUNTER — Encounter: Payer: Self-pay | Admitting: Orthopedic Surgery

## 2021-07-12 ENCOUNTER — Inpatient Hospital Stay: Payer: Medicare Other

## 2021-07-17 ENCOUNTER — Other Ambulatory Visit: Payer: Self-pay | Admitting: Family Medicine

## 2021-07-17 DIAGNOSIS — E039 Hypothyroidism, unspecified: Secondary | ICD-10-CM

## 2021-07-17 DIAGNOSIS — M25561 Pain in right knee: Secondary | ICD-10-CM | POA: Diagnosis not present

## 2021-07-17 DIAGNOSIS — G8929 Other chronic pain: Secondary | ICD-10-CM | POA: Diagnosis not present

## 2021-07-17 DIAGNOSIS — M25661 Stiffness of right knee, not elsewhere classified: Secondary | ICD-10-CM | POA: Diagnosis not present

## 2021-07-17 DIAGNOSIS — Z9889 Other specified postprocedural states: Secondary | ICD-10-CM | POA: Diagnosis not present

## 2021-07-17 DIAGNOSIS — M6281 Muscle weakness (generalized): Secondary | ICD-10-CM | POA: Diagnosis not present

## 2021-07-19 DIAGNOSIS — M25661 Stiffness of right knee, not elsewhere classified: Secondary | ICD-10-CM | POA: Diagnosis not present

## 2021-07-19 DIAGNOSIS — G8929 Other chronic pain: Secondary | ICD-10-CM | POA: Diagnosis not present

## 2021-07-19 DIAGNOSIS — M6281 Muscle weakness (generalized): Secondary | ICD-10-CM | POA: Diagnosis not present

## 2021-07-19 DIAGNOSIS — M25561 Pain in right knee: Secondary | ICD-10-CM | POA: Diagnosis not present

## 2021-07-19 DIAGNOSIS — Z9889 Other specified postprocedural states: Secondary | ICD-10-CM | POA: Diagnosis not present

## 2021-07-25 DIAGNOSIS — M25561 Pain in right knee: Secondary | ICD-10-CM | POA: Diagnosis not present

## 2021-07-25 DIAGNOSIS — Z9889 Other specified postprocedural states: Secondary | ICD-10-CM | POA: Diagnosis not present

## 2021-07-27 ENCOUNTER — Ambulatory Visit (INDEPENDENT_AMBULATORY_CARE_PROVIDER_SITE_OTHER): Payer: Medicare Other | Admitting: Family Medicine

## 2021-07-27 ENCOUNTER — Encounter: Payer: Self-pay | Admitting: Family Medicine

## 2021-07-27 ENCOUNTER — Other Ambulatory Visit: Payer: Self-pay

## 2021-07-27 VITALS — BP 131/76 | HR 78 | Ht 67.0 in | Wt 265.2 lb

## 2021-07-27 DIAGNOSIS — R519 Headache, unspecified: Secondary | ICD-10-CM | POA: Diagnosis not present

## 2021-07-27 DIAGNOSIS — R220 Localized swelling, mass and lump, head: Secondary | ICD-10-CM

## 2021-07-27 MED ORDER — PREDNISONE 10 MG PO TABS: ORAL_TABLET | ORAL | 0 refills | Status: DC

## 2021-07-27 MED ORDER — AMOXICILLIN-POT CLAVULANATE 875-125 MG PO TABS
1.0000 | ORAL_TABLET | Freq: Two times a day (BID) | ORAL | 0 refills | Status: DC
Start: 2021-07-27 — End: 2021-08-18

## 2021-07-27 NOTE — Progress Notes (Signed)
Subjective:    Patient ID: Amanda Webb, female    DOB: Nov 17, 1971, 50 y.o.   MRN: [id number]  Amanda Webb is a 50 y.o. female presenting on 07/27/2021 for Facial Swelling   HPI  R Facial Pain / Swelling  She has had R sided ear to facial / jaw swelling and pain for past 1-2 weeks. Admits significant stressors recently. Dental work yesterday, had a crown placed. She believed that dental pain was cause. It was numbed and felt better but whole area was numb. They did x-ray and said tooth was not infected. Denies fever chills congestion drainage cough other concerns Denies headache, jaw pain or clicking, hearing loss or pressure  Depression screen [facility] 2/9 07/27/2021 03/14/2021 11/08/2020  Decreased Interest '2 2 1  '$ Down, Depressed, Hopeless '2 3 1  '$ PHQ - 2 Score '4 5 2  '$ Altered sleeping - 3 3  Tired, decreased energy '3 1 2  '$ Change in appetite 2 0 1  Feeling bad or failure about yourself  '2 3 1  '$ Trouble concentrating 2 0 0  Moving slowly or fidgety/restless 0 0 0  Suicidal thoughts 0 0 0  PHQ-9 Score - 12 9  Difficult doing work/chores Extremely dIfficult Somewhat difficult Somewhat difficult  Some recent data might be hidden    Social History   Tobacco Use   Smoking status: Every Day    Packs/day: 1.00    Years: 32.00    Pack years: 32.00    Types: Cigarettes   Smokeless tobacco: Current   Tobacco comments:    1 ppd/ 11/22/2020  Vaping Use   Vaping Use: Never used  Substance Use Topics   Alcohol use: Yes    Comment: wine occ   Drug use: No    Review of Systems Per HPI unless specifically indicated above     Objective:    BP 131/76   Pulse 78   Ht '5\' 7"'$  (1.702 m)   Wt 265 lb 3.2 oz (120.3 kg)   SpO2 99%   BMI 41.54 kg/m   Wt Readings from Last 3 Encounters:  07/27/21 265 lb 3.2 oz (120.3 kg)  07/11/21 256 lb (116.1 kg)  06/07/21 263 lb 1.9 oz (119.3 kg)    Physical Exam Vitals and nursing note reviewed.  Constitutional:      General: She is not in  acute distress.    Appearance: Normal appearance. She is well-developed. She is not diaphoretic.     Comments: Well-appearing, comfortable, cooperative  HENT:     Head: Normocephalic and atraumatic.     Comments: Localized mild tender over R facial area more central zygomatic bone    Right Ear: Tympanic membrane, ear canal and external ear normal. There is no impacted cerumen.     Left Ear: Tympanic membrane, ear canal and external ear normal. There is no impacted cerumen.     Ears:     Comments: Mild clear effusion R side but not significant enough and no bulging    Nose: Nose normal. No congestion.  Eyes:     General:        Right eye: No discharge.        Left eye: No discharge.     Conjunctiva/sclera: Conjunctivae normal.  Cardiovascular:     Rate and Rhythm: Normal rate.  Pulmonary:     Effort: Pulmonary effort is normal.  Skin:    General: Skin is warm and dry.     Findings: No erythema or  rash.  Neurological:     Mental Status: She is alert and oriented to person, place, and time.  Psychiatric:        Mood and Affect: Mood normal.        Behavior: Behavior normal.        Thought Content: Thought content normal.     Comments: Well groomed, good eye contact, normal speech and thoughts   Results for orders placed or performed during the hospital encounter of 07/11/21  Pregnancy, urine POC  Result Value Ref Range   Preg Test, Ur NEGATIVE NEGATIVE      Assessment & Plan:   Problem List Items Addressed This Visit   None Visit Diagnoses     Right facial swelling    -  Primary   Relevant Medications   amoxicillin-clavulanate (AUGMENTIN) 875-125 MG tablet   predniSONE (DELTASONE) 10 MG tablet   Facial pain, acute       Relevant Medications   predniSONE (DELTASONE) 10 MG tablet       Uncertain exact diagnosis Suspect dental etiology for pain based on history and dental eval Has upcoming oral surgery apt in 11 days for potential tooth extraction, had filling  replaced seems to not have resolved problem.  Question if ear or or sinus or other etiology exam not supportive today  Trial Prednisone taper for facial pain and swelling May add on Augmentin if indicated if worse concerns for possible dental infection or less likely sinus or ear infection however again unlikely  She will f/u with oral surgery or let us know if want to consult ENT first before dental extraction  Meds ordered this encounter  Medications   amoxicillin-clavulanate (AUGMENTIN) 875-125 MG tablet    Sig: Take 1 tablet by mouth 2 (two) times daily.    Dispense:  20 tablet    Refill:  0   predniSONE (DELTASONE) 10 MG tablet    Sig: Take 6 tabs with breakfast Day 1, 5 tabs Day 2, 4 tabs Day 3, 3 tabs Day 4, 2 tabs Day 5, 1 tab Day 6.    Dispense:  21 tablet    Refill:  0      Follow up plan: Return if symptoms worsen or fail to improve.  [name], [facility] 07/27/2021, 11:26 AM

## 2021-07-27 NOTE — Patient Instructions (Addendum)
Thank you for coming to the office today.  Start with prednisone taper May be more pain and inflammatory symptoms, could be from the dental/tooth  No obvious sign of ear cause and jaw seems normal not TMJ, unlikely sinusitis  If not improving try the antibiotic for possible infection  Keep apt with oral surgery, if want to see ENT for 2nd opinion let me know   Please schedule a Follow-up Appointment to: Return if symptoms worsen or fail to improve.  If you have any other questions or concerns, please feel free to call the office or send a message through [facility]. You may also schedule an earlier appointment if necessary.  Additionally, you may be receiving a survey about your experience at our office within a few days to 1 week by e-mail or mail. We value your feedback.  [name], DO [place]

## 2021-07-28 ENCOUNTER — Encounter: Payer: Self-pay | Admitting: Family Medicine

## 2021-07-28 DIAGNOSIS — L600 Ingrowing nail: Secondary | ICD-10-CM

## 2021-08-04 DIAGNOSIS — F4310 Post-traumatic stress disorder, unspecified: Secondary | ICD-10-CM | POA: Diagnosis not present

## 2021-08-07 DIAGNOSIS — Z20828 Contact with and (suspected) exposure to other viral communicable diseases: Secondary | ICD-10-CM | POA: Diagnosis not present

## 2021-08-09 ENCOUNTER — Inpatient Hospital Stay: Payer: Medicare Other | Attending: Oncology

## 2021-08-09 ENCOUNTER — Other Ambulatory Visit: Payer: Self-pay

## 2021-08-09 DIAGNOSIS — E538 Deficiency of other specified B group vitamins: Secondary | ICD-10-CM | POA: Insufficient documentation

## 2021-08-09 DIAGNOSIS — D509 Iron deficiency anemia, unspecified: Secondary | ICD-10-CM

## 2021-08-09 MED ORDER — CYANOCOBALAMIN 1000 MCG/ML IJ SOLN
1000.0000 ug | Freq: Once | INTRAMUSCULAR | Status: AC
  Administered 2021-08-09: 1000 ug via INTRAMUSCULAR

## 2021-08-16 ENCOUNTER — Encounter: Payer: Self-pay | Admitting: Family Medicine

## 2021-08-16 DIAGNOSIS — R220 Localized swelling, mass and lump, head: Secondary | ICD-10-CM

## 2021-08-17 DIAGNOSIS — Z20828 Contact with and (suspected) exposure to other viral communicable diseases: Secondary | ICD-10-CM | POA: Diagnosis not present

## 2021-08-18 ENCOUNTER — Encounter: Payer: Self-pay | Admitting: Family Medicine

## 2021-08-18 MED ORDER — AMOXICILLIN-POT CLAVULANATE 875-125 MG PO TABS: 1.0000 | ORAL_TABLET | Freq: Two times a day (BID) | ORAL | 0 refills | Status: DC

## 2021-08-28 ENCOUNTER — Other Ambulatory Visit: Payer: Self-pay

## 2021-08-28 ENCOUNTER — Ambulatory Visit (INDEPENDENT_AMBULATORY_CARE_PROVIDER_SITE_OTHER): Payer: Medicare Other

## 2021-08-28 DIAGNOSIS — Z3042 Encounter for surveillance of injectable contraceptive: Secondary | ICD-10-CM | POA: Diagnosis not present

## 2021-08-28 MED ORDER — MEDROXYPROGESTERONE ACETATE 150 MG/ML IM SUSP
150.0000 mg | Freq: Once | INTRAMUSCULAR | Status: AC
  Administered 2021-08-28: 150 mg via INTRAMUSCULAR

## 2021-08-28 NOTE — Progress Notes (Signed)
Date last pap: 09/27/2020. Last Depo-Provera: .06/12/2021 Side Effects if any: none. Serum HCG indicated? N/A. Depo-Provera 150 mg IM given by: Lovell Sheehan, LPN. Next appointment due November 28-November 27, 2021.   Patient supplied depo injection.

## 2021-09-13 ENCOUNTER — Other Ambulatory Visit: Payer: Self-pay

## 2021-09-13 ENCOUNTER — Inpatient Hospital Stay: Payer: Medicare Other | Attending: Oncology

## 2021-09-13 DIAGNOSIS — E538 Deficiency of other specified B group vitamins: Secondary | ICD-10-CM | POA: Diagnosis not present

## 2021-09-13 DIAGNOSIS — D509 Iron deficiency anemia, unspecified: Secondary | ICD-10-CM

## 2021-09-13 MED ORDER — CYANOCOBALAMIN 1000 MCG/ML IJ SOLN
1000.0000 ug | Freq: Once | INTRAMUSCULAR | Status: AC
  Administered 2021-09-13: 1000 ug via INTRAMUSCULAR

## 2021-09-15 ENCOUNTER — Other Ambulatory Visit: Payer: Self-pay | Admitting: Family Medicine

## 2021-09-15 DIAGNOSIS — E039 Hypothyroidism, unspecified: Secondary | ICD-10-CM

## 2021-09-15 DIAGNOSIS — I10 Essential (primary) hypertension: Secondary | ICD-10-CM

## 2021-09-15 DIAGNOSIS — J0100 Acute maxillary sinusitis, unspecified: Secondary | ICD-10-CM

## 2021-09-15 NOTE — Telephone Encounter (Signed)
Requested medications are due for refill today early request  Requested medications are on the active medication list yes  Last refill 07/17/21  Last visit 07/27/21  Future visit scheduled 09/21/21  Notes to clinic requesting early, please assess.

## 2021-09-18 MED ORDER — LEVOTHYROXINE SODIUM 50 MCG PO TABS: 50.0000 ug | ORAL_TABLET | Freq: Every day | ORAL | 1 refills | Status: DC

## 2021-09-21 ENCOUNTER — Ambulatory Visit: Payer: Medicare Other | Admitting: Family Medicine

## 2021-09-21 ENCOUNTER — Ambulatory Visit (INDEPENDENT_AMBULATORY_CARE_PROVIDER_SITE_OTHER): Payer: Medicare Other | Admitting: Family Medicine

## 2021-09-21 ENCOUNTER — Other Ambulatory Visit: Payer: Self-pay

## 2021-09-21 VITALS — Wt 265.0 lb

## 2021-09-21 DIAGNOSIS — Z23 Encounter for immunization: Secondary | ICD-10-CM

## 2021-09-28 ENCOUNTER — Encounter: Payer: Medicare Other | Admitting: Obstetrics and Gynecology

## 2021-10-05 ENCOUNTER — Other Ambulatory Visit: Payer: Self-pay

## 2021-10-05 ENCOUNTER — Ambulatory Visit (INDEPENDENT_AMBULATORY_CARE_PROVIDER_SITE_OTHER): Payer: Medicare Other | Admitting: Obstetrics and Gynecology

## 2021-10-05 ENCOUNTER — Encounter: Payer: Self-pay | Admitting: Obstetrics and Gynecology

## 2021-10-05 VITALS — BP 134/82 | HR 79 | Ht 67.0 in | Wt 265.4 lb

## 2021-10-05 DIAGNOSIS — Z801 Family history of malignant neoplasm of trachea, bronchus and lung: Secondary | ICD-10-CM | POA: Diagnosis not present

## 2021-10-05 DIAGNOSIS — N3946 Mixed incontinence: Secondary | ICD-10-CM

## 2021-10-05 DIAGNOSIS — Z1231 Encounter for screening mammogram for malignant neoplasm of breast: Secondary | ICD-10-CM

## 2021-10-05 DIAGNOSIS — Z0000 Encounter for general adult medical examination without abnormal findings: Secondary | ICD-10-CM | POA: Diagnosis not present

## 2021-10-05 DIAGNOSIS — Z01419 Encounter for gynecological examination (general) (routine) without abnormal findings: Secondary | ICD-10-CM

## 2021-10-05 MED ORDER — TOLTERODINE TARTRATE ER 2 MG PO CP24: 2.0000 mg | ORAL_CAPSULE | Freq: Every day | ORAL | 2 refills | Status: DC

## 2021-10-05 MED ORDER — MEDROXYPROGESTERONE ACETATE 150 MG/ML IM SUSP: INTRAMUSCULAR | 1 refills | Status: DC

## 2021-10-05 NOTE — Progress Notes (Signed)
HPI:      [name] is a 50 y.o. [id number] who LMP was No LMP recorded (lmp unknown). Patient has had an injection.  Subjective:   She presents today for her annual examination.  She remains on the Depo-Provera and does not want to discontinue it.  She is mostly amenorrheic but occasionally has some small amount of breakthrough bleeding.  When she is not on the Depo her periods are very heavy. She also states that she has problems with urinary incontinence especially at night.  Once she starts to have some incontinence she can stop her bladder from completely emptying.  She also states that she has some urine loss with coughing laughing and sneezing.  But reports that her main problem is complete emptying of her bladder when she starts to leak. Family history of lung cancer (father died of small cell) and her mother has also been diagnosed with lung cancer. Patient continues to smoke cigarettes but has an active plan for discontinuation.    Hx: The following portions of the patient's history were reviewed and updated as appropriate:             She  has a past medical history of Allergy, Anemia, Anxiety, Arrhythmia, Arthritis, Cervical dysplasia, Cystitis, Depression, Dyspnea, GERD (gastroesophageal reflux disease), Gross hematuria, Headache, Heart murmur, History of methicillin resistant staphylococcus aureus (MRSA) (2013), HLD (hyperlipidemia), Hypertension, Hypothyroid, Lymphedema, Palpitations, Pernicious anemia, and Tobacco abuse. She does not have any pertinent problems on file. She  has a past surgical history that includes Foot surgery (Right, 2013); Carpal tunnel release (Bilateral, 2011); Tonsillectomy (1979); Robotic assisted laparoscopic cholecystectomy (07/15/2020); and Knee arthroscopy with medial menisectomy (Right, 07/11/2021). Her family history includes Alcohol abuse in her mother; Cancer in her maternal grandmother; Depression in her father, mother, and paternal grandfather;  Diabetes in her father and maternal grandmother; Drug abuse in her father and paternal grandfather; Kidney disease in her maternal grandfather; Lung cancer in her mother; Mental illness in her father and mother; Ovarian cancer in her maternal grandmother. She  reports that she has been smoking cigarettes. She has a 32.00 pack-year smoking history. She has never used smokeless tobacco. She reports current alcohol use. She reports that she does not use drugs. She has a current medication list which includes the following prescription(s): accu-chek fastclix lancets, accu-chek guide, albuterol, aripiprazole, atenolol, azelastine, cetirizine hcl, cyanocobalamin, fluticasone, trelegy ellipta, fluvoxamine, gabapentin, ketoconazole, lamotrigine, levothyroxine, multivitamin with minerals, nystatin-triamcinolone, pantoprazole, tolterodine, amoxicillin-clavulanate, hydrocodone-acetaminophen, medroxyprogesterone, ondansetron, and prednisone. She is allergic to bee venom, cat hair extract, dog epithelium, dog epithelium allergy skin test, dog fennel, dog fennel allergy skin test, dust mite extract, tetracyclines & related, lac bovis, lactose, milk protein, and tape.       Review of Systems:  Review of Systems  Constitutional: Denied constitutional symptoms, night sweats, recent illness, fatigue, fever, insomnia and weight loss.  Eyes: Denied eye symptoms, eye pain, photophobia, vision change and visual disturbance.  Ears/Nose/Throat/Neck: Denied ear, nose, throat or neck symptoms, hearing loss, nasal discharge, sinus congestion and sore throat.  Cardiovascular: Denied cardiovascular symptoms, arrhythmia, chest pain/pressure, edema, exercise intolerance, orthopnea and palpitations.  Respiratory: Denied pulmonary symptoms, asthma, pleuritic pain, productive sputum, cough, dyspnea and wheezing.  Gastrointestinal: Denied, gastro-esophageal reflux, melena, nausea and vomiting.  Genitourinary: Denied genitourinary  symptoms including symptomatic vaginal discharge, pelvic relaxation issues, and urinary complaints.  Musculoskeletal: Denied musculoskeletal symptoms, stiffness, swelling, muscle weakness and myalgia.  Dermatologic: Denied dermatology symptoms, rash and scar.  Neurologic: Denied  neurology symptoms, dizziness, headache, neck pain and syncope.  Psychiatric: Denied psychiatric symptoms, anxiety and depression.  Endocrine: Denied endocrine symptoms including hot flashes and night sweats.   Meds:   Current Outpatient Medications on File Prior to Visit  Medication Sig Dispense Refill   Accu-Chek FastClix Lancets MISC USE TO CHECK SUGAR UP TO TWICE DAILY AS DIRECTED 102 each 5   ACCU-CHEK GUIDE test strip CHECK BLOOD SUGAR UP TO TWICE DAILY AS DIRECTED 200 strip 5   albuterol (VENTOLIN HFA) 108 (90 Base) MCG/ACT inhaler INHALE 1 TO 2 PUFFS BY MOUTH EVERY 4 TO 6 HOURS AS NEEDED FOR SHORTNESS OF BREATH 18 g 2   ARIPiprazole (ABILIFY) 2 MG tablet Take 2 mg by mouth every morning.      atenolol (TENORMIN) 50 MG tablet TAKE 1 TABLET(50 MG) BY MOUTH DAILY 90 tablet 1   azelastine (ASTELIN) 0.1 % nasal spray INSTILL 1 SPRAY INTO BOTH NOSTRILS TWICE DAILY AS DIRECTED 30 mL 5   Cetirizine HCl 10 MG CAPS Take 10 mg by mouth daily with lunch.      Cyanocobalamin (B-12 COMPLIANCE INJECTION IJ) Inject 1 Dose as directed every 30 (thirty) days.     fluticasone (FLONASE) 50 MCG/ACT nasal spray SHAKE LIQUID AND USE 2 SPRAYS IN EACH NOSTRIL EVERY DAY 48 g 1   Fluticasone-Umeclidin-Vilant (TRELEGY ELLIPTA) 100-62.5-25 MCG/INH AEPB Inhale into the lungs.     fluvoxaMINE (LUVOX) 100 MG tablet Take 150 mg by mouth every morning.      gabapentin (NEURONTIN) 100 MG capsule Take 200 mg by mouth 3 (three) times daily.      ketoconazole (NIZORAL) 2 % shampoo APPLY EXTERNALLY 2 TIMES A WEEK 120 mL 2   lamoTRIgine (LAMICTAL) 200 MG tablet Take 200 mg by mouth every morning.      levothyroxine (SYNTHROID) 50 MCG tablet Take 1  tablet (50 mcg total) by mouth daily before breakfast. 90 tablet 1   Multiple Vitamin (MULTIVITAMIN WITH MINERALS) TABS tablet Take 1 tablet by mouth daily.     nystatin-triamcinolone (MYCOLOG II) cream Apply 1 application topically 2 (two) times daily. For 10-14 days 60 g 0   pantoprazole (PROTONIX) 20 MG tablet TAKE 1 TABLET(20 MG) BY MOUTH DAILY 90 tablet 1   amoxicillin-clavulanate (AUGMENTIN) 875-125 MG tablet Take 1 tablet by mouth 2 (two) times daily. (Patient not taking: Reported on 10/05/2021) 20 tablet 0   HYDROcodone-acetaminophen (NORCO) 5-325 MG tablet Take 1-2 tablets by mouth every 4 (four) hours as needed for moderate pain or severe pain. 10 tablet 0   ondansetron (ZOFRAN ODT) 4 MG disintegrating tablet Take 1 tablet (4 mg total) by mouth every 8 (eight) hours as needed for nausea or vomiting. 20 tablet 0   predniSONE (DELTASONE) 10 MG tablet Take 6 tabs with breakfast Day 1, 5 tabs Day 2, 4 tabs Day 3, 3 tabs Day 4, 2 tabs Day 5, 1 tab Day 6. 21 tablet 0   No current facility-administered medications on file prior to visit.       Objective:     Vitals:   10/05/21 1109  BP: 134/82  Pulse: 79    Filed Weights   10/05/21 1109  Weight: 265 lb 6.4 oz (120.4 kg)              Physical examination General NAD, Conversant  HEENT Atraumatic; Op clear with mmm.  Normo-cephalic. Pupils reactive. Anicteric sclerae  Thyroid/Neck Smooth without nodularity or enlargement. Normal ROM.  Neck Supple.  Skin No  rashes, lesions or ulceration. Normal palpated skin turgor. No nodularity.  Breasts: No masses or discharge.  Symmetric.  No axillary adenopathy.  Lungs: Clear to auscultation.No rales or wheezes. Normal Respiratory effort, no retractions.  Heart: NSR.  No murmurs or rubs appreciated. No periferal edema  Abdomen: Soft.  Non-tender.  No masses.  No HSM. No hernia  Extremities: Moves all appropriately.  Normal ROM for age. No lymphadenopathy.  Neuro: Oriented to PPT.  Normal  mood. Normal affect.     Pelvic:   Vulva: Normal appearance.  No lesions.  Vagina: No lesions or abnormalities noted.  Support: Normal pelvic support.  Urethra No masses tenderness or scarring.  Meatus Normal size without lesions or prolapse.  Cervix: Normal appearance.  No lesions.  Anus: Normal exam.  No lesions.  Perineum: Normal exam.  No lesions.        Bimanual   Uterus: Normal size.  Non-tender.  Mobile.  AV.  Adnexae: No masses.  Non-tender to palpation.  Cul-de-sac: Negative for abnormality.     Assessment:    [id number] Patient Active Problem List   Diagnosis Date Noted   Major depressive disorder, recurrent, in partial remission ([place]) 11/08/2020   Bronchitis 10/21/2020   Cough 10/21/2020   Acute non-recurrent sinusitis 08/24/2020   Status post laparoscopic cholecystectomy 07/28/2020   Morbid obesity ([place]) 07/05/2020   Hepatic steatosis 06/23/2020   Lymphedema 03/09/2020   Chronic venous insufficiency 03/09/2020   Bilateral lower extremity edema 02/16/2020   Female cystocele 10/21/2018   Urinary incontinence, mixed 10/21/2018   B12 deficiency 04/08/2018   Iron deficiency anemia 03/14/2018   GAD (generalized anxiety disorder) 03/10/2018   Acute right-sided low back pain 10/28/2017   Tobacco abuse 05/09/2017   Multiple thyroid nodules 12/28/2016   Simple chronic bronchitis ([place]) 12/13/2016   BMI 40.0-44.9, adult ([place]) 09/26/2016   HLD (hyperlipidemia) 09/26/2016   Vitamin D deficiency 09/26/2016   Pre-diabetes 06/17/2015   Candidal intertrigo 02/21/2015   Candidiasis of skin and nail 02/21/2015   Cellulitis and abscess of leg 02/18/2015   Benign cyst of right kidney 01/17/2015   Wheezing 01/11/2015   Chronic sinusitis 11/15/2014   Pneumonia due to infectious organism 11/02/2014   Whiplash injury 09/03/2014   Heart palpitations 04/02/2014   Lower urinary tract infectious disease 02/16/2014   Allergic rhinitis 12/31/2013   Spondylosis of lumbar region  without myelopathy or radiculopathy 12/28/2013   Diarrhea 11/09/2013   Chronic low back pain 09/28/2013   Other fatigue 09/28/2013   Hypothyroidism 09/28/2013   Alteration of body temperature 09/28/2013   Plantar fasciitis, bilateral 07/10/2013   Tarsal tunnel syndrome of right side 07/10/2013   Plantar fascial fibromatosis 07/10/2013   Encounter for surveillance of injectable contraceptive 05/13/2013   Encounter for Depo-Provera contraception 05/13/2013   Diuresis excessive 03/24/2013   Abnormal uterine and vaginal bleeding, unspecified 03/24/2013   Polyuria 03/24/2013   Arthritis, multiple joint involvement 03/17/2013   Fibroblastic disorder 07/28/2012   Borderline personality disorder ([place]) 12/17/2010   Essential hypertension 02/18/2010   Carpal tunnel syndrome on both sides 12/17/2006   Major depressive disorder, single episode 02/21/1998   Disk prolapse 04/17/1995   Depression with anxiety 03/18/1995   OCD (obsessive compulsive disorder) 03/18/1995   Severe anxiety with panic 03/18/1995     1. Well woman exam with routine gynecological exam   2. Family history of lung cancer   3. Mixed incontinence   4. Screening mammogram, encounter for        Plan:  1.  Basic Screening Recommendations The basic screening recommendations for asymptomatic women were discussed with the patient during her visit.  The age-appropriate recommendations were discussed with her and the rational for the tests reviewed.  When I am informed by the patient that another primary care physician has previously obtained the age-appropriate tests and they are up-to-date, only outstanding tests are ordered and referrals given as necessary.  Abnormal results of tests will be discussed with her when all of her results are completed.  Routine preventative health maintenance measures emphasized: Exercise/Diet/Weight control, Tobacco Warnings, Alcohol/Substance use risks and Stress Management Mammogram  ordered 2.  Patient strongly encouraged to follow through with her plan to discontinue smoking. 3.  Detrol for mixed incontinence -especially overactive bladder. 4.  Patient desires a continuation of Depo. Orders Orders Placed This Encounter  Procedures   MM DIGITAL SCREENING BILATERAL     Meds ordered this encounter  Medications   tolterodine (DETROL LA) 2 MG 24 hr capsule    Sig: Take 1 capsule (2 mg total) by mouth daily.    Dispense:  30 capsule    Refill:  2          F/U  Return in about 3 months (around 01/05/2022) for Annual Physical.  Finis Bud, M.D. 10/05/2021 11:45 AM

## 2021-10-06 ENCOUNTER — Encounter: Payer: Self-pay | Admitting: Family Medicine

## 2021-10-06 ENCOUNTER — Ambulatory Visit (INDEPENDENT_AMBULATORY_CARE_PROVIDER_SITE_OTHER): Payer: Medicare Other | Admitting: Family Medicine

## 2021-10-06 VITALS — BP 138/76 | HR 74 | Ht 67.0 in | Wt 260.0 lb

## 2021-10-06 DIAGNOSIS — G500 Trigeminal neuralgia: Secondary | ICD-10-CM

## 2021-10-06 MED ORDER — BACLOFEN 10 MG PO TABS: 10.0000 mg | ORAL_TABLET | Freq: Three times a day (TID) | ORAL | 3 refills | Status: DC | PRN

## 2021-10-06 MED ORDER — GABAPENTIN 300 MG PO CAPS: 300.0000 mg | ORAL_CAPSULE | Freq: Three times a day (TID) | ORAL | 3 refills | Status: DC

## 2021-10-06 NOTE — Progress Notes (Signed)
Subjective:    Patient ID: Amanda Webb, female    DOB: Nov 04, 1971, 50 y.o.   MRN: [id number]  Amanda Webb is a 50 y.o. female presenting on 10/06/2021 for Facial Pain   HPI  Trigeminal Neuralgia Dental Pain Pain R face/jaw for past 3 months, onset 2-3 days after knee procedure, she had anesthesia. Followed by [facility] Surgery had recent dental extraction, did not resolve her pain, still has pain, has returned to them. They said there was nothing else wrong, she questions "dry socket" and nerve irritation. She has apt next week on Monday w/ her regular dentist. She was given 3.5 days of Amoxicillin by oral surgery Taking Ibuprofen with relief but taking regularly She is on multiple medications including Gabapentin, Lamictal Not seen Neurology Not having headaches  Depression screen [facility] 2/9 10/06/2021 07/27/2021 03/14/2021  Decreased Interest 1 2 2   Down, Depressed, Hopeless 1 2 3   PHQ - 2 Score 2 4 5   Altered sleeping 1 - 3  Tired, decreased energy 1 3 1   Change in appetite 1 2 0  Feeling bad or failure about yourself  1 2 3   Trouble concentrating 1 2 0  Moving slowly or fidgety/restless 1 0 0  Suicidal thoughts 0 0 0  PHQ-9 Score 8 - 12  Difficult doing work/chores Somewhat difficult Extremely dIfficult Somewhat difficult  Some recent data might be hidden    Social History   Tobacco Use   Smoking status: Every Day    Packs/day: 1.00    Years: 32.00    Pack years: 32.00    Types: Cigarettes   Smokeless tobacco: Never   Tobacco comments:    1 ppd/ 11/22/2020  Vaping Use   Vaping Use: Never used  Substance Use Topics   Alcohol use: Yes    Comment: wine occ   Drug use: No    Review of Systems Per HPI unless specifically indicated above     Objective:    BP 138/76   Pulse 74   Ht 5\' 7"  (1.702 m)   Wt 260 lb (117.9 kg)   LMP  (LMP Unknown)   SpO2 100%   BMI 40.72 kg/m   Wt Readings from Last 3 Encounters:  10/06/21 260 lb (117.9 kg)  10/05/21  265 lb 6.4 oz (120.4 kg)  09/21/21 265 lb (120.2 kg)    Physical Exam Vitals and nursing note reviewed.  Constitutional:      General: She is not in acute distress.    Appearance: Normal appearance. She is well-developed. She is not diaphoretic.     Comments: Well-appearing, comfortable, cooperative  HENT:     Head: Normocephalic and atraumatic.  Eyes:     General:        Right eye: No discharge.        Left eye: No discharge.     Conjunctiva/sclera: Conjunctivae normal.  Cardiovascular:     Rate and Rhythm: Normal rate.  Pulmonary:     Effort: Pulmonary effort is normal.  Skin:    General: Skin is warm and dry.     Findings: No erythema or rash.  Neurological:     Mental Status: She is alert and oriented to person, place, and time.  Psychiatric:        Mood and Affect: Mood normal.        Behavior: Behavior normal.        Thought Content: Thought content normal.     Comments: Well groomed, good  eye contact, normal speech and thoughts   Results for orders placed or performed during the hospital encounter of 07/11/21  Pregnancy, urine POC  Result Value Ref Range   Preg Test, Ur NEGATIVE NEGATIVE      Assessment & Plan:   Problem List Items Addressed This Visit   None Visit Diagnoses     Trigeminal neuralgia    -  Primary   Relevant Medications   gabapentin (NEURONTIN) 300 MG capsule   baclofen (LIORESAL) 10 MG tablet   Other Relevant Orders   Ambulatory referral to Neurology      Current history and presentation most consistent with Trigeminal neuralgia / facial pain in maxillary distribution middle of face jaw up toward ear  She is already on multiple medications indicated for this at lower dose with Gabapentin 200 TID and Lamictal. She cannot take Carbamazepine or Oxcarbazapine due to med interactions and duplication of therapy for her mental health.  NSAIDs do help, but history today seems more suggestive of neuropathic pain, given chronic >3 months, unsure  exact etiology  Will inc dose gabapentin to 300 TID and add Baclofen TID PRN - as indicated for trigeminal neuralgia  Referral to [facility] Neuro locally for further management, given complexity of case limited med options, may warrant Head imaging / nerve testing / nerve block in future.  She will go to her dentist for 2nd opinion on dental etiology.   Orders Placed This Encounter  Procedures   Ambulatory referral to Neurology    Referral Priority:   Routine    Referral Type:   Consultation    Referral Reason:   Specialty Services Required    Requested Specialty:   Neurology    Number of Visits Requested:   1     Meds ordered this encounter  Medications   gabapentin (NEURONTIN) 300 MG capsule    Sig: Take 1 capsule (300 mg total) by mouth 3 (three) times daily.    Dispense:  90 capsule    Refill:  3   baclofen (LIORESAL) 10 MG tablet    Sig: Take 1 tablet (10 mg total) by mouth 3 (three) times daily as needed (nerve pain).    Dispense:  90 tablet    Refill:  3      Follow up plan: Return if symptoms worsen or fail to improve.    [name], [facility] 10/06/2021, 10:12 AM

## 2021-10-06 NOTE — Patient Instructions (Addendum)
Thank you for coming to the office today.  Increase Gabapentin to a 300mg  3 times a day, and can add 1 extra dose of Gabapentin 100 if need  Baclofen muscle relaxant that can treat nerve pain  Try this half or whole tab as needed for pain.  Alternate with the Ibuprofen  Referral sent to Neuro - stay tuned on apt. Discuss Trigeminal neuralgia and imaging as well as pain control.  [facility] - Neurology Dept [place], [place] [address] Phone: [phone]       Trigeminal Neuralgia Trigeminal neuralgia is a nerve disorder that causes severe pain on one side of the face. The pain may last from a few seconds to several minutes. The pain is usually only on one side of the face. Symptoms may occur for days, weeks, or months and then go away for months or years. The pain may return and be worse than before. What are the causes? This condition is caused by damage or pressure to a nerve in the head that is called the trigeminal nerve. An attack can be triggered by: Talking. Chewing. Putting on makeup. Washing your face. Shaving your face. Brushing your teeth. Touching your face. What increases the risk? You are more likely to develop this condition if you: Are 69 years of age or older. Are female. What are the signs or symptoms? The main symptom of this condition is severe pain in the: Jaw. Lips. Eyes. Nose. Scalp. Forehead. Face. The pain may be: Intense. Stabbing. Electric. Shock-like. How is this diagnosed? This condition is diagnosed with a physical exam. A CT scan or an MRI may be done to rule out other conditions that can cause facial pain. How is this treated? This condition may be treated with: Avoiding the things that trigger your symptoms. Taking prescription medicines (anticonvulsants). Having surgery. This may be done in severe cases if other medical treatment does not provide relief. Having procedures such as ablation, thermal, or  radiation therapy. It may take up to one month for treatment to start relieving the pain. Follow these instructions at home: Managing pain Learn as much as you can about how to manage your pain. Ask your health care provider if a pain specialist would be helpful. Consider talking with a mental health care provider (psychologist) about how to cope with the pain. Consider joining a pain support group. General instructions Take over-the-counter and prescription medicines only as told by your health care provider. Avoid the things that trigger your symptoms. It may help to: Chew on the unaffected side of your mouth. Avoid touching your face. Avoid blasts of hot or cold air. Follow your treatment plan as told by your health care provider. This may include: Cognitive or behavioral therapy. Gentle, regular exercise. Meditation or yoga. Aromatherapy. Keep all follow-up visits as told by your health care provider. You may need to be monitored closely to make sure treatment is working well for you. Where to find more information Facial Pain Association: fpa-support.org Contact a health care provider if: Your medicine is not helping your symptoms. You have side effects from the medicine used for treatment. You develop new, unexplained symptoms, such as: Double vision. Facial weakness. Facial numbness. Changes in hearing or balance. You feel depressed. Get help right away if: Your pain is severe and is not getting better. You develop suicidal thoughts. If you ever feel like you may hurt yourself or others, or have thoughts about taking your own life, get help right away.  You can go to your nearest emergency department or call: Your local emergency services (911 in the [place] A suicide crisis helpline, such as the [place] at 5303631930. This is open 24 hours a day. Summary Trigeminal neuralgia is a nerve disorder that causes severe pain on one side of the  face. The pain may last from a few seconds to several minutes. This condition is caused by damage or pressure to a nerve in the head that is called the trigeminal nerve. Treatment may include avoiding the things that trigger your symptoms, taking medicines, or having surgery or procedures. It may take up to one month for treatment to start relieving the pain. Avoid the things that trigger your symptoms. Keep all follow-up visits as told by your health care provider. You may need to be monitored closely to make sure treatment is working well for you. This information is not intended to replace advice given to you by your health care provider. Make sure you discuss any questions you have with your health care provider. Document Revised: 10/20/2018 Document Reviewed: 10/20/2018 Elsevier Patient Education  2022 [place].   Please schedule a Follow-up Appointment to: Return if symptoms worsen or fail to improve.  If you have any other questions or concerns, please feel free to call the office or send a message through [facility]. You may also schedule an earlier appointment if necessary.  Additionally, you may be receiving a survey about your experience at our office within a few days to 1 week by e-mail or mail. We value your feedback.  [name], DO [place]

## 2021-10-09 ENCOUNTER — Encounter: Payer: Self-pay | Admitting: Family Medicine

## 2021-10-10 ENCOUNTER — Other Ambulatory Visit: Payer: Self-pay

## 2021-10-10 MED ORDER — OXYBUTYNIN CHLORIDE ER 5 MG PO TB24: 5.0000 mg | ORAL_TABLET | Freq: Every day | ORAL | 0 refills | Status: DC

## 2021-10-11 ENCOUNTER — Inpatient Hospital Stay: Payer: Medicare Other | Attending: Oncology

## 2021-10-11 ENCOUNTER — Other Ambulatory Visit: Payer: Self-pay

## 2021-10-11 DIAGNOSIS — E538 Deficiency of other specified B group vitamins: Secondary | ICD-10-CM | POA: Diagnosis not present

## 2021-10-11 DIAGNOSIS — D509 Iron deficiency anemia, unspecified: Secondary | ICD-10-CM

## 2021-10-11 MED ORDER — CYANOCOBALAMIN 1000 MCG/ML IJ SOLN
1000.0000 ug | Freq: Once | INTRAMUSCULAR | Status: AC
Start: 2021-10-11 — End: 2021-10-11
  Administered 2021-10-11: 1000 ug via INTRAMUSCULAR

## 2021-10-12 ENCOUNTER — Other Ambulatory Visit: Payer: Self-pay | Admitting: Pulmonary Disease

## 2021-10-12 DIAGNOSIS — I10 Essential (primary) hypertension: Secondary | ICD-10-CM | POA: Diagnosis not present

## 2021-10-12 DIAGNOSIS — R220 Localized swelling, mass and lump, head: Secondary | ICD-10-CM | POA: Diagnosis not present

## 2021-10-12 DIAGNOSIS — F419 Anxiety disorder, unspecified: Secondary | ICD-10-CM | POA: Diagnosis not present

## 2021-10-12 DIAGNOSIS — K219 Gastro-esophageal reflux disease without esophagitis: Secondary | ICD-10-CM | POA: Diagnosis not present

## 2021-10-12 DIAGNOSIS — Z881 Allergy status to other antibiotic agents status: Secondary | ICD-10-CM | POA: Diagnosis not present

## 2021-10-12 DIAGNOSIS — Z7951 Long term (current) use of inhaled steroids: Secondary | ICD-10-CM | POA: Diagnosis not present

## 2021-10-12 DIAGNOSIS — Z825 Family history of asthma and other chronic lower respiratory diseases: Secondary | ICD-10-CM | POA: Diagnosis not present

## 2021-10-12 DIAGNOSIS — E039 Hypothyroidism, unspecified: Secondary | ICD-10-CM | POA: Diagnosis not present

## 2021-10-12 DIAGNOSIS — M797 Fibromyalgia: Secondary | ICD-10-CM | POA: Diagnosis not present

## 2021-10-12 DIAGNOSIS — K0889 Other specified disorders of teeth and supporting structures: Secondary | ICD-10-CM | POA: Diagnosis not present

## 2021-10-12 DIAGNOSIS — Z20822 Contact with and (suspected) exposure to covid-19: Secondary | ICD-10-CM | POA: Diagnosis not present

## 2021-10-12 DIAGNOSIS — F32A Depression, unspecified: Secondary | ICD-10-CM | POA: Diagnosis not present

## 2021-10-12 DIAGNOSIS — Z79899 Other long term (current) drug therapy: Secondary | ICD-10-CM | POA: Diagnosis not present

## 2021-10-12 DIAGNOSIS — F17210 Nicotine dependence, cigarettes, uncomplicated: Secondary | ICD-10-CM | POA: Diagnosis not present

## 2021-10-13 DIAGNOSIS — K0889 Other specified disorders of teeth and supporting structures: Secondary | ICD-10-CM | POA: Diagnosis not present

## 2021-10-26 ENCOUNTER — Other Ambulatory Visit: Payer: Self-pay

## 2021-10-26 ENCOUNTER — Encounter: Payer: Self-pay | Admitting: Family Medicine

## 2021-10-26 DIAGNOSIS — I10 Essential (primary) hypertension: Secondary | ICD-10-CM

## 2021-10-26 MED ORDER — ATENOLOL 50 MG PO TABS: ORAL_TABLET | ORAL | 1 refills | Status: DC

## 2021-10-27 ENCOUNTER — Telehealth: Payer: Medicare Other | Admitting: Emergency Medicine

## 2021-10-27 ENCOUNTER — Telehealth: Payer: Medicare Other | Admitting: Physician Assistant

## 2021-10-27 DIAGNOSIS — B9789 Other viral agents as the cause of diseases classified elsewhere: Secondary | ICD-10-CM

## 2021-10-27 DIAGNOSIS — J218 Acute bronchiolitis due to other specified organisms: Secondary | ICD-10-CM

## 2021-10-27 DIAGNOSIS — J189 Pneumonia, unspecified organism: Secondary | ICD-10-CM

## 2021-10-27 MED ORDER — AZITHROMYCIN 250 MG PO TABS: ORAL_TABLET | ORAL | 0 refills | Status: AC

## 2021-10-27 MED ORDER — PREDNISONE 10 MG (21) PO TBPK: ORAL_TABLET | ORAL | 0 refills | Status: DC

## 2021-10-27 MED ORDER — BENZONATATE 100 MG PO CAPS: 100.0000 mg | ORAL_CAPSULE | Freq: Three times a day (TID) | ORAL | 0 refills | Status: DC | PRN

## 2021-10-27 MED ORDER — CEFPODOXIME PROXETIL 200 MG PO TABS
200.0000 mg | ORAL_TABLET | Freq: Two times a day (BID) | ORAL | 0 refills | Status: DC
Start: 2021-10-27 — End: 2021-12-19

## 2021-10-27 MED ORDER — CEFPODOXIME PROXETIL 200 MG PO TABS: 200.0000 mg | ORAL_TABLET | Freq: Two times a day (BID) | ORAL | Status: DC

## 2021-10-27 NOTE — Patient Instructions (Signed)
Amanda Webb, thank you for joining Carvel Getting, NP for today's virtual visit.  While this provider is not your primary care provider (PCP), if your PCP is located in our provider database this encounter information will be shared with them immediately following your visit.  Consent: (Patient) Amanda Webb provided verbal consent for this virtual visit at the beginning of the encounter.  Current Medications:  Current Outpatient Medications:    azithromycin (ZITHROMAX) 250 MG tablet, Take 2 tablets on day 1, then 1 tablet daily on days 2 through 5, Disp: 6 tablet, Rfl: 0   Accu-Chek FastClix Lancets MISC, USE TO CHECK SUGAR UP TO TWICE DAILY AS DIRECTED, Disp: 102 each, Rfl: 5   ACCU-CHEK GUIDE test strip, CHECK BLOOD SUGAR UP TO TWICE DAILY AS DIRECTED, Disp: 200 strip, Rfl: 5   albuterol (VENTOLIN HFA) 108 (90 Base) MCG/ACT inhaler, INHALE 1 TO 2 PUFFS BY MOUTH EVERY 4 TO 6 HOURS AS NEEDED FOR SHORTNESS OF BREATH, Disp: 18 g, Rfl: 2   ARIPiprazole (ABILIFY) 2 MG tablet, Take 2 mg by mouth every morning. , Disp: , Rfl:    atenolol (TENORMIN) 50 MG tablet, TAKE 1 TABLET(50 MG) BY MOUTH DAILY, Disp: 90 tablet, Rfl: 1   azelastine (ASTELIN) 0.1 % nasal spray, INSTILL 1 SPRAY INTO BOTH NOSTRILS TWICE DAILY AS DIRECTED, Disp: 30 mL, Rfl: 5   benzonatate (TESSALON) 100 MG capsule, Take 1 capsule (100 mg total) by mouth 3 (three) times daily as needed., Disp: 30 capsule, Rfl: 0   Cetirizine HCl 10 MG CAPS, Take 10 mg by mouth daily with lunch. , Disp: , Rfl:    Cyanocobalamin (B-12 COMPLIANCE INJECTION IJ), Inject 1 Dose as directed every 30 (thirty) days., Disp: , Rfl:    fluticasone (FLONASE) 50 MCG/ACT nasal spray, SHAKE LIQUID AND USE 2 SPRAYS IN EACH NOSTRIL EVERY DAY, Disp: 48 g, Rfl: 1   Fluticasone-Umeclidin-Vilant (TRELEGY ELLIPTA) 100-62.5-25 MCG/INH AEPB, Inhale into the lungs., Disp: , Rfl:    fluvoxaMINE (LUVOX) 100 MG tablet, Take 150 mg by mouth every morning. , Disp: , Rfl:     gabapentin (NEURONTIN) 300 MG capsule, Take 1 capsule (300 mg total) by mouth 3 (three) times daily., Disp: 90 capsule, Rfl: 3   ketoconazole (NIZORAL) 2 % shampoo, APPLY EXTERNALLY 2 TIMES A WEEK, Disp: 120 mL, Rfl: 2   lamoTRIgine (LAMICTAL) 200 MG tablet, Take 200 mg by mouth every morning. , Disp: , Rfl:    levothyroxine (SYNTHROID) 50 MCG tablet, Take 1 tablet (50 mcg total) by mouth daily before breakfast., Disp: 90 tablet, Rfl: 1   medroxyPROGESTERone (DEPO-PROVERA) 150 MG/ML injection, Bring depo injection to the office each visit., Disp: 1 mL, Rfl: 1   Multiple Vitamin (MULTIVITAMIN WITH MINERALS) TABS tablet, Take 1 tablet by mouth daily., Disp: , Rfl:    nystatin-triamcinolone (MYCOLOG II) cream, Apply 1 application topically 2 (two) times daily. For 10-14 days, Disp: 60 g, Rfl: 0   oxybutynin (DITROPAN XL) 5 MG 24 hr tablet, Take 1 tablet (5 mg total) by mouth at bedtime., Disp: 90 tablet, Rfl: 0   pantoprazole (PROTONIX) 20 MG tablet, TAKE 1 TABLET(20 MG) BY MOUTH DAILY, Disp: 90 tablet, Rfl: 1   predniSONE (STERAPRED UNI-PAK 21 TAB) 10 MG (21) TBPK tablet, 6 day taper; take as directed on package instructions, Disp: 21 tablet, Rfl: 0  Current Facility-Administered Medications:    cefpodoxime (VANTIN) tablet 200 mg, 200 mg, Oral, Q12H, Suvi Archuletta, Dionne Bucy, NP   Medications  ordered in this encounter:  Meds ordered this encounter  Medications   cefpodoxime (VANTIN) tablet 200 mg   azithromycin (ZITHROMAX) 250 MG tablet    Sig: Take 2 tablets on day 1, then 1 tablet daily on days 2 through 5    Dispense:  6 tablet    Refill:  0     *If you need refills on other medications prior to your next appointment, please contact your pharmacy*  Follow-Up: Call back or seek an in-person evaluation if the symptoms worsen or if the condition fails to improve as anticipated.  Other Instructions If you are not getting better with this treatment, you will need an in-person visit to be evaluated.     If you have been instructed to have an in-person evaluation today at a local Urgent Care facility, please use the link below. It will take you to a list of all of our available [facility], including address, phone number and hours of operation. Please do not delay care.  [facility]  If you or a family member do not have a primary care provider, use the link below to schedule a visit and establish care. When you choose a [facility] primary care physician or advanced practice provider, you gain a long-term partner in health. Find a Primary Care Provider  Learn more about 's in-office and virtual care options: [facility]

## 2021-10-27 NOTE — Progress Notes (Signed)
We are sorry that you are not feeling well.  Here is how we plan to help!  Based on your presentation I believe you most likely have A cough due to a virus.  This is called viral bronchitis and is best treated by rest, plenty of fluids and control of the cough.  You may use Ibuprofen or Tylenol as directed to help your symptoms.     In addition you may use A prescription cough medication called Tessalon Perles 136m. You may take 1-2 capsules every 8 hours as needed for your cough.  Prednisone 10 mg daily for 6 days (see taper instructions below)  Directions for 6 day taper: Day 1: 2 tablets before breakfast, 1 after both lunch & dinner and 2 at bedtime Day 2: 1 tab before breakfast, 1 after both lunch & dinner and 2 at bedtime Day 3: 1 tab at each meal & 1 at bedtime Day 4: 1 tab at breakfast, 1 at lunch, 1 at bedtime Day 5: 1 tab at breakfast & 1 tab at bedtime Day 6: 1 tab at breakfast  From your responses in the eVisit questionnaire you describe inflammation in the upper respiratory tract which is causing a significant cough.  This is commonly called Bronchitis and has four common causes:   Allergies Viral Infections Acid Reflux Bacterial Infection Allergies, viruses and acid reflux are treated by controlling symptoms or eliminating the cause. An example might be a cough caused by taking certain blood pressure medications. You stop the cough by changing the medication. Another example might be a cough caused by acid reflux. Controlling the reflux helps control the cough.  USE OF BRONCHODILATOR ("RESCUE") INHALERS: There is a risk from using your bronchodilator too frequently.  The risk is that over-reliance on a medication which only relaxes the muscles surrounding the breathing tubes can reduce the effectiveness of medications prescribed to reduce swelling and congestion of the tubes themselves.  Although you feel brief relief from the bronchodilator inhaler, your asthma may actually be  worsening with the tubes becoming more swollen and filled with mucus.  This can delay other crucial treatments, such as oral steroid medications. If you need to use a bronchodilator inhaler daily, several times per day, you should discuss this with your provider.  There are probably better treatments that could be used to keep your asthma under control.     HOME CARE Only take medications as instructed by your medical team. Complete the entire course of an antibiotic. Drink plenty of fluids and get plenty of rest. Avoid close contacts especially the very young and the elderly Cover your mouth if you cough or cough into your sleeve. Always remember to wash your hands A steam or ultrasonic humidifier can help congestion.   GET HELP RIGHT AWAY IF: You develop worsening fever. You become short of breath You cough up blood. Your symptoms persist after you have completed your treatment plan MAKE SURE YOU  Understand these instructions. Will watch your condition. Will get help right away if you are not doing well or get worse.    Thank you for choosing an e-visit.  Your e-visit answers were reviewed by a board certified advanced clinical practitioner to complete your personal care plan. Depending upon the condition, your plan could have included both over the counter or prescription medications.  Please review your pharmacy choice. Make sure the pharmacy is open so you can pick up prescription now. If there is a problem, you may contact your provider  through [employer] and have the prescription routed to another pharmacy.  Your safety is important to [place]. If you have drug allergies check your prescription carefully.   For the next 24 hours you can use MyChart to ask questions about today's visit, request a non-urgent call back, or ask for a work or school excuse. You will get an email in the next two days asking about your experience. I hope that your e-visit has been valuable and will  speed your recovery.  I provided 6 minutes of non face-to-face time during this encounter for chart review and documentation.

## 2021-10-27 NOTE — Progress Notes (Signed)
Virtual Visit Consent   Amanda Webb, you are scheduled for a virtual visit with a [facility] provider today.     Just as with appointments in the office, your consent must be obtained to participate.  Your consent will be active for this visit and any virtual visit you may have with one of our providers in the next 365 days.     If you have a MyChart account, a copy of this consent can be sent to you electronically.  All virtual visits are billed to your insurance company just like a traditional visit in the office.    As this is a virtual visit, video technology does not allow for your provider to perform a traditional examination.  This may limit your provider's ability to fully assess your condition.  If your provider identifies any concerns that need to be evaluated in person or the need to arrange testing (such as labs, EKG, etc.), we will make arrangements to do so.     Although advances in technology are sophisticated, we cannot ensure that it will always work on either your end or our end.  If the connection with a video visit is poor, the visit may have to be switched to a telephone visit.  With either a video or telephone visit, we are not always able to ensure that we have a secure connection.     I need to obtain your verbal consent now.   Are you willing to proceed with your visit today?    CATERA HANKINS has provided verbal consent on 10/27/2021 for a virtual visit (video or telephone).   Carvel Getting, NP   Date: 10/27/2021 3:21 PM   Virtual Visit via Video Note   I, Carvel Getting, connected with  ESRAA SERES  ([id number], Apr 11, 1971) on 10/27/21 at  3:00 PM EST by a video-enabled telemedicine application and verified that I am speaking with the correct person using two identifiers.  Location: Patient: Virtual Visit Location Patient: Home Provider: Virtual Visit Location Provider: Home Office   I discussed the limitations of evaluation and management by  telemedicine and the availability of in person appointments. The patient expressed understanding and agreed to proceed.    History of Present Illness: Amanda Webb is a 50 y.o. who identifies as a female who was assigned female at birth, and is being seen today for patient did an E-visit earlier today and was diagnosed with viral bronchitis, given prescriptions for Tessalon Perles and prednisone.  She then signed onto and video visit with me because she felt like she did not communicate her problem sufficiently over the E-visit questionnaire.  She reports a productive cough for 4 days with green sputum.  She has had pneumonia in the past, last was last February, and she is worried she has pneumonia again she has not had a fever.  But she has had shortness of breath, productive cough, and wheezing.  She is using her albuterol inhaler several times a day that helps the wheezing however it is not helping her cough or the green sputum.  She does not have any nasal congestion.  She is recently been on antibiotics she took Augmentin from 10/24-10/31 for dry socket.  She reports she is also been on 7 antibiotics over the last year for pneumonia, her dental problems, and other infections.  HPI: HPI  Problems:  Patient Active Problem List   Diagnosis Date Noted   Major depressive disorder, recurrent, in partial  remission ([place]) 11/08/2020   Bronchitis 10/21/2020   Cough 10/21/2020   Acute non-recurrent sinusitis 08/24/2020   Status post laparoscopic cholecystectomy 07/28/2020   Morbid obesity ([place]) 07/05/2020   Hepatic steatosis 06/23/2020   Lymphedema 03/09/2020   Chronic venous insufficiency 03/09/2020   Bilateral lower extremity edema 02/16/2020   Female cystocele 10/21/2018   Urinary incontinence, mixed 10/21/2018   B12 deficiency 04/08/2018   Iron deficiency anemia 03/14/2018   GAD (generalized anxiety disorder) 03/10/2018   Acute right-sided low back pain 10/28/2017   Tobacco abuse 05/09/2017    Multiple thyroid nodules 12/28/2016   Simple chronic bronchitis ([place]) 12/13/2016   BMI 40.0-44.9, adult ([place]) 09/26/2016   HLD (hyperlipidemia) 09/26/2016   Vitamin D deficiency 09/26/2016   Pre-diabetes 06/17/2015   Candidal intertrigo 02/21/2015   Candidiasis of skin and nail 02/21/2015   Cellulitis and abscess of leg 02/18/2015   Benign cyst of right kidney 01/17/2015   Wheezing 01/11/2015   Chronic sinusitis 11/15/2014   Pneumonia due to infectious organism 11/02/2014   Whiplash injury 09/03/2014   Heart palpitations 04/02/2014   Lower urinary tract infectious disease 02/16/2014   Allergic rhinitis 12/31/2013   Spondylosis of lumbar region without myelopathy or radiculopathy 12/28/2013   Diarrhea 11/09/2013   Chronic low back pain 09/28/2013   Other fatigue 09/28/2013   Hypothyroidism 09/28/2013   Alteration of body temperature 09/28/2013   Plantar fasciitis, bilateral 07/10/2013   Tarsal tunnel syndrome of right side 07/10/2013   Plantar fascial fibromatosis 07/10/2013   Encounter for surveillance of injectable contraceptive 05/13/2013   Encounter for Depo-Provera contraception 05/13/2013   Diuresis excessive 03/24/2013   Abnormal uterine and vaginal bleeding, unspecified 03/24/2013   Polyuria 03/24/2013   Arthritis, multiple joint involvement 03/17/2013   Fibroblastic disorder 07/28/2012   Borderline personality disorder ([place]) 12/17/2010   Essential hypertension 02/18/2010   Carpal tunnel syndrome on both sides 12/17/2006   Major depressive disorder, single episode 02/21/1998   Disk prolapse 04/17/1995   Depression with anxiety 03/18/1995   OCD (obsessive compulsive disorder) 03/18/1995   Severe anxiety with panic 03/18/1995    Allergies:  Allergies  Allergen Reactions   Bee Venom Anaphylaxis, Hives and Swelling    Carries Epi pen.    Cat Hair Extract Itching, Other (See Comments) and Swelling    Allergic to trees, nuts, wheat, grass, cats & dogs - itchy  watery eyes, swelling. Uses Zyrtec & Flonase & Benadryl if really bad. Used to get allergy shots. Allergic to trees, nuts, wheat, grass, cats & dogs - itchy watery eyes, swelling. Uses Zyrtec & Flonase & Benadryl if really bad. Used to get allergy shots.   Dog Epithelium Cough and Shortness Of Breath   Dog Epithelium Allergy Skin Test Shortness Of Breath   Dog Fennel Cough and Shortness Of Breath   Dog Fennel Allergy Skin Test Shortness Of Breath   Dust Mite Extract Cough and Shortness Of Breath   Tetracyclines & Related Nausea And Vomiting   Lac Bovis Diarrhea and Nausea And Vomiting   Lactose Diarrhea and Nausea And Vomiting   Milk Protein Diarrhea and Nausea And Vomiting   Tape Other (See Comments) and Rash    Needs to use paper tape. Breaks out with severe rash, pulls skin off when using adhesive.   Medications:  Current Outpatient Medications:    azithromycin (ZITHROMAX) 250 MG tablet, Take 2 tablets on day 1, then 1 tablet daily on days 2 through 5, Disp: 6 tablet, Rfl: 0   Accu-Chek  FastClix Lancets MISC, USE TO CHECK SUGAR UP TO TWICE DAILY AS DIRECTED, Disp: 102 each, Rfl: 5   ACCU-CHEK GUIDE test strip, CHECK BLOOD SUGAR UP TO TWICE DAILY AS DIRECTED, Disp: 200 strip, Rfl: 5   albuterol (VENTOLIN HFA) 108 (90 Base) MCG/ACT inhaler, INHALE 1 TO 2 PUFFS BY MOUTH EVERY 4 TO 6 HOURS AS NEEDED FOR SHORTNESS OF BREATH, Disp: 18 g, Rfl: 2   ARIPiprazole (ABILIFY) 2 MG tablet, Take 2 mg by mouth every morning. , Disp: , Rfl:    atenolol (TENORMIN) 50 MG tablet, TAKE 1 TABLET(50 MG) BY MOUTH DAILY, Disp: 90 tablet, Rfl: 1   azelastine (ASTELIN) 0.1 % nasal spray, INSTILL 1 SPRAY INTO BOTH NOSTRILS TWICE DAILY AS DIRECTED, Disp: 30 mL, Rfl: 5   benzonatate (TESSALON) 100 MG capsule, Take 1 capsule (100 mg total) by mouth 3 (three) times daily as needed., Disp: 30 capsule, Rfl: 0   Cetirizine HCl 10 MG CAPS, Take 10 mg by mouth daily with lunch. , Disp: , Rfl:    Cyanocobalamin (B-12  COMPLIANCE INJECTION IJ), Inject 1 Dose as directed every 30 (thirty) days., Disp: , Rfl:    fluticasone (FLONASE) 50 MCG/ACT nasal spray, SHAKE LIQUID AND USE 2 SPRAYS IN EACH NOSTRIL EVERY DAY, Disp: 48 g, Rfl: 1   Fluticasone-Umeclidin-Vilant (TRELEGY ELLIPTA) 100-62.5-25 MCG/INH AEPB, Inhale into the lungs., Disp: , Rfl:    fluvoxaMINE (LUVOX) 100 MG tablet, Take 150 mg by mouth every morning. , Disp: , Rfl:    gabapentin (NEURONTIN) 300 MG capsule, Take 1 capsule (300 mg total) by mouth 3 (three) times daily., Disp: 90 capsule, Rfl: 3   ketoconazole (NIZORAL) 2 % shampoo, APPLY EXTERNALLY 2 TIMES A WEEK, Disp: 120 mL, Rfl: 2   lamoTRIgine (LAMICTAL) 200 MG tablet, Take 200 mg by mouth every morning. , Disp: , Rfl:    levothyroxine (SYNTHROID) 50 MCG tablet, Take 1 tablet (50 mcg total) by mouth daily before breakfast., Disp: 90 tablet, Rfl: 1   medroxyPROGESTERone (DEPO-PROVERA) 150 MG/ML injection, Bring depo injection to the office each visit., Disp: 1 mL, Rfl: 1   Multiple Vitamin (MULTIVITAMIN WITH MINERALS) TABS tablet, Take 1 tablet by mouth daily., Disp: , Rfl:    nystatin-triamcinolone (MYCOLOG II) cream, Apply 1 application topically 2 (two) times daily. For 10-14 days, Disp: 60 g, Rfl: 0   oxybutynin (DITROPAN XL) 5 MG 24 hr tablet, Take 1 tablet (5 mg total) by mouth at bedtime., Disp: 90 tablet, Rfl: 0   pantoprazole (PROTONIX) 20 MG tablet, TAKE 1 TABLET(20 MG) BY MOUTH DAILY, Disp: 90 tablet, Rfl: 1   predniSONE (STERAPRED UNI-PAK 21 TAB) 10 MG (21) TBPK tablet, 6 day taper; take as directed on package instructions, Disp: 21 tablet, Rfl: 0  Current Facility-Administered Medications:    cefpodoxime (VANTIN) tablet 200 mg, 200 mg, Oral, Q12H, Maritza Golds[place], Dionne Bucy, NP  Observations/Objective: Patient is well-developed, well-nourished in no acute distress.  Resting comfortably  at home.  Head is normocephalic, atraumatic.  No labored breathing.  Speech is clear and coherent with  logical content.  Patient is alert and oriented at baseline.  Pt showed me her green sputum on a tissue.   Assessment and Plan: CAP  Given that patient was recently on Augmentin, I chose to prescribe cefpodoxime and a Z-Pak instead.  Encourage patient to continue using her inhaler as needed.  At the [facility] earlier today she was prescribed prednisone and Tessalon Perles, and I instructed her to use those as  prescribed.  Patient is aware that if this does not improve her symptoms she will need an in person visit for an evaluation.     Follow Up Instructions: I discussed the assessment and treatment plan with the patient. The patient was provided an opportunity to ask questions and all were answered. The patient agreed with the plan and demonstrated an understanding of the instructions.  A copy of instructions were sent to the patient via MyChart unless otherwise noted below.   The patient was advised to call back or seek an in-person evaluation if the symptoms worsen or if the condition fails to improve as anticipated.  Time:  I spent 12 minutes with the patient via telehealth technology discussing the above problems/concerns.    Carvel Getting, NP

## 2021-10-27 NOTE — Addendum Note (Signed)
Addended by: Mar Daring on: 10/27/2021 03:53 PM   Modules accepted: Orders

## 2021-10-30 DIAGNOSIS — Z20828 Contact with and (suspected) exposure to other viral communicable diseases: Secondary | ICD-10-CM | POA: Diagnosis not present

## 2021-11-05 ENCOUNTER — Other Ambulatory Visit: Payer: Self-pay | Admitting: Family Medicine

## 2021-11-05 DIAGNOSIS — R7303 Prediabetes: Secondary | ICD-10-CM

## 2021-11-05 NOTE — Telephone Encounter (Signed)
Requested Prescriptions  Pending Prescriptions Disp Refills  . Accu-Chek FastClix Lancets MISC [Pharmacy Med Name: ACCU-CHEK FASTCLIX LANCETS 023'X] 435 each 5    Sig: USE TO CHECK SUGAR UP TO TWICE DAILY AS DIRECTED     Endocrinology: Diabetes - Testing Supplies Passed - 11/05/2021  9:40 AM      Passed - Valid encounter within last 12 months    Recent Outpatient Visits          1 month ago Trigeminal neuralgia   [place], DO   1 month ago Needs flu shot   [place], DO   3 months ago Right facial swelling   [place], DO   8 months ago Atypical pneumonia   [place], DO   9 months ago Atypical pneumonia   [place], DO      Future Appointments            In 4 months [facility], [facility]

## 2021-11-08 ENCOUNTER — Inpatient Hospital Stay: Payer: Medicare Other | Attending: Oncology

## 2021-11-08 ENCOUNTER — Other Ambulatory Visit: Payer: Self-pay

## 2021-11-08 DIAGNOSIS — E538 Deficiency of other specified B group vitamins: Secondary | ICD-10-CM | POA: Insufficient documentation

## 2021-11-08 DIAGNOSIS — D509 Iron deficiency anemia, unspecified: Secondary | ICD-10-CM

## 2021-11-08 MED ORDER — CYANOCOBALAMIN 1000 MCG/ML IJ SOLN
1000.0000 ug | Freq: Once | INTRAMUSCULAR | Status: AC
  Administered 2021-11-08: 1000 ug via INTRAMUSCULAR
  Filled 2021-11-08: qty 1

## 2021-11-13 ENCOUNTER — Ambulatory Visit (INDEPENDENT_AMBULATORY_CARE_PROVIDER_SITE_OTHER): Payer: Medicare Other | Admitting: Obstetrics and Gynecology

## 2021-11-13 ENCOUNTER — Other Ambulatory Visit: Payer: Self-pay

## 2021-11-13 ENCOUNTER — Other Ambulatory Visit: Payer: Self-pay | Admitting: Family Medicine

## 2021-11-13 DIAGNOSIS — Z3042 Encounter for surveillance of injectable contraceptive: Secondary | ICD-10-CM

## 2021-11-13 DIAGNOSIS — K219 Gastro-esophageal reflux disease without esophagitis: Secondary | ICD-10-CM

## 2021-11-13 MED ORDER — MEDROXYPROGESTERONE ACETATE 150 MG/ML IM SUSP
150.0000 mg | Freq: Once | INTRAMUSCULAR | Status: AC
  Administered 2021-11-13: 09:00:00 150 mg via INTRAMUSCULAR

## 2021-11-13 NOTE — Progress Notes (Signed)
Date last pap: 09/27/2020. Last Depo-Provera: 10/05/2021. Side Effects if any: None. Serum HCG indicated? N/A. Depo-Provera 150 mg IM given by: Cristy Folks, CMA. Next appointment due: Feb. 13 - Feb. 27

## 2021-11-14 NOTE — Telephone Encounter (Signed)
Requested Prescriptions  Pending Prescriptions Disp Refills  . pantoprazole (PROTONIX) 20 MG tablet [Pharmacy Med Name: PANTOPRAZOLE 20MG  TABLETS] 90 tablet 1    Sig: TAKE 1 TABLET(20 MG) BY MOUTH DAILY     Gastroenterology: Proton Pump Inhibitors Passed - 11/13/2021  3:26 PM      Passed - Valid encounter within last 12 months    Recent Outpatient Visits          1 month ago Trigeminal neuralgia   [place], DO   1 month ago Needs flu shot   [place], DO   3 months ago Right facial swelling   [place], DO   9 months ago Atypical pneumonia   [place], DO   9 months ago Atypical pneumonia   [place], DO      Future Appointments            In 4 months [facility], [facility]

## 2021-11-15 ENCOUNTER — Encounter: Payer: Self-pay | Admitting: Family Medicine

## 2021-11-23 DIAGNOSIS — F4310 Post-traumatic stress disorder, unspecified: Secondary | ICD-10-CM | POA: Diagnosis not present

## 2021-11-24 ENCOUNTER — Telehealth: Payer: Self-pay | Admitting: Obstetrics and Gynecology

## 2021-11-24 NOTE — Telephone Encounter (Signed)
Pt called stating that she was billed/ charged incorrectly for her depo injection on 06-12-2021, pt states that the wrong cpt code was used. Please Advise.

## 2021-12-06 ENCOUNTER — Other Ambulatory Visit: Payer: Self-pay

## 2021-12-06 ENCOUNTER — Encounter: Payer: Self-pay | Admitting: Family Medicine

## 2021-12-06 ENCOUNTER — Encounter: Payer: Self-pay | Admitting: Oncology

## 2021-12-06 DIAGNOSIS — T3695XA Adverse effect of unspecified systemic antibiotic, initial encounter: Secondary | ICD-10-CM

## 2021-12-06 MED ORDER — NYSTATIN-TRIAMCINOLONE 100000-0.1 UNIT/GM-% EX CREA: 1.0000 "application " | TOPICAL_CREAM | Freq: Two times a day (BID) | CUTANEOUS | 0 refills | Status: DC

## 2021-12-07 ENCOUNTER — Inpatient Hospital Stay: Payer: Medicare Other | Attending: Oncology

## 2021-12-07 ENCOUNTER — Telehealth: Payer: Self-pay

## 2021-12-07 DIAGNOSIS — E538 Deficiency of other specified B group vitamins: Secondary | ICD-10-CM | POA: Insufficient documentation

## 2021-12-07 DIAGNOSIS — D509 Iron deficiency anemia, unspecified: Secondary | ICD-10-CM | POA: Insufficient documentation

## 2021-12-07 NOTE — Telephone Encounter (Signed)
Call pt to r/s from no  show. Left message for pt to call [phone]

## 2021-12-09 ENCOUNTER — Other Ambulatory Visit: Payer: Self-pay | Admitting: Family Medicine

## 2021-12-09 DIAGNOSIS — J309 Allergic rhinitis, unspecified: Secondary | ICD-10-CM

## 2021-12-09 NOTE — Telephone Encounter (Signed)
Requested Prescriptions  Pending Prescriptions Disp Refills   fluticasone (FLONASE) 50 MCG/ACT nasal spray [Pharmacy Med Name: FLUTICASONE 50MCG NAS SP(120SP) RX] 48 g 1    Sig: SHAKE LIQUID AND USE 2 SPRAYS IN EACH NOSTRIL EVERY DAY     Ear, Nose, and Throat: Nasal Preparations - Corticosteroids Passed - 12/09/2021 12:19 AM      Passed - Valid encounter within last 12 months    Recent Outpatient Visits          2 months ago Trigeminal neuralgia   [place], DO   2 months ago Needs flu shot   [place], DO   4 months ago Right facial swelling   [place], DO   9 months ago Atypical pneumonia   [place], DO   10 months ago Atypical pneumonia   [place], DO      Future Appointments            In 1 week [place], Devonne Doughty, [facility], [place]   In 3 months  [facility], [place]

## 2021-12-13 ENCOUNTER — Other Ambulatory Visit: Payer: Self-pay

## 2021-12-13 ENCOUNTER — Inpatient Hospital Stay: Payer: Medicare Other

## 2021-12-13 DIAGNOSIS — E538 Deficiency of other specified B group vitamins: Secondary | ICD-10-CM | POA: Diagnosis not present

## 2021-12-13 DIAGNOSIS — D509 Iron deficiency anemia, unspecified: Secondary | ICD-10-CM | POA: Diagnosis not present

## 2021-12-13 LAB — CBC WITH DIFFERENTIAL/PLATELET
Abs Immature Granulocytes: 0.03 10*3/uL (ref 0.00–0.07)
Basophils Absolute: 0.1 10*3/uL (ref 0.0–0.1)
Basophils Relative: 1 %
Eosinophils Absolute: 0.2 10*3/uL (ref 0.0–0.5)
Eosinophils Relative: 2 %
HCT: 42.7 % (ref 36.0–46.0)
Hemoglobin: 14.5 g/dL (ref 12.0–15.0)
Immature Granulocytes: 0 %
Lymphocytes Relative: 31 %
Lymphs Abs: 2.7 10*3/uL (ref 0.7–4.0)
MCH: 31.3 pg (ref 26.0–34.0)
MCHC: 34 g/dL (ref 30.0–36.0)
MCV: 92 fL (ref 80.0–100.0)
Monocytes Absolute: 0.6 10*3/uL (ref 0.1–1.0)
Monocytes Relative: 6 %
Neutro Abs: 5.3 10*3/uL (ref 1.7–7.7)
Neutrophils Relative %: 60 %
Platelets: 258 10*3/uL (ref 150–400)
RBC: 4.64 MIL/uL (ref 3.87–5.11)
RDW: 12.9 % (ref 11.5–15.5)
WBC: 8.8 10*3/uL (ref 4.0–10.5)
nRBC: 0 % (ref 0.0–0.2)

## 2021-12-13 LAB — IRON AND TIBC
Iron: 101 ug/dL (ref 28–170)
Saturation Ratios: 26 % (ref 10.4–31.8)
TIBC: 382 ug/dL (ref 250–450)
UIBC: 281 ug/dL

## 2021-12-13 LAB — FERRITIN: Ferritin: 88 ng/mL (ref 11–307)

## 2021-12-13 MED ORDER — CYANOCOBALAMIN 1000 MCG/ML IJ SOLN
1000.0000 ug | Freq: Once | INTRAMUSCULAR | Status: AC
  Administered 2021-12-13: 13:00:00 1000 ug via INTRAMUSCULAR

## 2021-12-19 ENCOUNTER — Encounter: Payer: Self-pay | Admitting: Family Medicine

## 2021-12-19 ENCOUNTER — Other Ambulatory Visit: Payer: Self-pay

## 2021-12-19 ENCOUNTER — Ambulatory Visit (INDEPENDENT_AMBULATORY_CARE_PROVIDER_SITE_OTHER): Payer: Medicare Other | Admitting: Family Medicine

## 2021-12-19 VITALS — HR 67 | Ht 67.0 in | Wt 264.2 lb

## 2021-12-19 DIAGNOSIS — E1169 Type 2 diabetes mellitus with other specified complication: Secondary | ICD-10-CM | POA: Diagnosis not present

## 2021-12-19 DIAGNOSIS — Z6841 Body Mass Index (BMI) 40.0 and over, adult: Secondary | ICD-10-CM | POA: Diagnosis not present

## 2021-12-19 LAB — POCT GLYCOSYLATED HEMOGLOBIN (HGB A1C): Hemoglobin A1C: 6.9 % — AB (ref 4.0–5.6)

## 2021-12-19 MED ORDER — OZEMPIC (0.25 OR 0.5 MG/DOSE) 2 MG/1.5ML ~~LOC~~ SOPN
0.2500 mg | PEN_INJECTOR | SUBCUTANEOUS | 0 refills | Status: DC
Start: 2021-12-19 — End: 2021-12-27

## 2021-12-19 NOTE — Patient Instructions (Addendum)
Thank you for coming to the office today.  Last A1c 6.6 (10/2020)  Due today for repeat A1c, stay tuned on mychart.  Call insurance find cost and coverage of the following   Ozempic (Semaglutide injection) - start 0.25mg  weekly for 4 weeks then increase to 0.5mg  weekly for 2 weeks until sample runs out  Message in 3-4 weeks if doing well and or ready to proceed to your own prescription.  Ozempic (injection once week Trulicity (injection once week  3 benefits - 1 significantly reduced A1c sugar, and may be able to reduce or stop metformin in future - 2 reduced appetite and weight loss with good results - 3 cardiovascular risk reduction, less likely to have heart attack/stroke   Please schedule a Follow-up Appointment to: Return in about 4 months (around 04/18/2022) for 4 month follow-up Weight, PreDM A1c.  If you have any other questions or concerns, please feel free to call the office or send a message through [facility]. You may also schedule an earlier appointment if necessary.  Additionally, you may be receiving a survey about your experience at our office within a few days to 1 week by e-mail or mail. We value your feedback.  [name], DO Center

## 2021-12-19 NOTE — Progress Notes (Signed)
Subjective:    Patient ID: [name], female    DOB: Feb 28, 1971, 51 y.o.   MRN: [id number]  JAKALYN KRATKY is a 51 y.o. female presenting on 12/19/2021 for Obesity   HPI  Morbid Obesity BMI >40 Pre-Diabetes vs Diabetes History of elevated A1c, up to 6.6 in past, then had been controlled. Year prior was >7 She has had issues with abnormal weight gain, increased appetite Due for A1c repeat today.  Depression screen [facility] 2/9 12/19/2021 10/06/2021 07/27/2021  Decreased Interest 2 1 2   Down, Depressed, Hopeless 3 1 2   PHQ - 2 Score 5 2 4   Altered sleeping 3 1 -  Tired, decreased energy 2 1 3   Change in appetite 2 1 2   Feeling bad or failure about yourself  3 1 2   Trouble concentrating 1 1 2   Moving slowly or fidgety/restless 0 1 0  Suicidal thoughts 0 0 0  PHQ-9 Score 16 8 -  Difficult doing work/chores Extremely dIfficult Somewhat difficult Extremely dIfficult  Some recent data might be hidden    Social History   Tobacco Use   Smoking status: Every Day    Packs/day: 1.00    Years: 32.00    Pack years: 32.00    Types: Cigarettes   Smokeless tobacco: Never   Tobacco comments:    1 ppd/ 11/22/2020  Vaping Use   Vaping Use: Never used  Substance Use Topics   Alcohol use: Yes    Comment: wine occ   Drug use: No    Review of Systems Per HPI unless specifically indicated above     Objective:    Pulse 67    Ht 5\' 7"  (1.702 m)    Wt 264 lb 3.2 oz (119.8 kg)    SpO2 98%    BMI 41.38 kg/m   Wt Readings from Last 3 Encounters:  12/19/21 264 lb 3.2 oz (119.8 kg)  10/06/21 260 lb (117.9 kg)  10/05/21 265 lb 6.4 oz (120.4 kg)    Physical Exam Vitals and nursing note reviewed.  Constitutional:      General: She is not in acute distress.    Appearance: Normal appearance. She is well-developed. She is obese. She is not diaphoretic.     Comments: Well-appearing, comfortable, cooperative  HENT:     Head: Normocephalic and atraumatic.  Eyes:     General:        Right  eye: No discharge.        Left eye: No discharge.     Conjunctiva/sclera: Conjunctivae normal.  Cardiovascular:     Rate and Rhythm: Normal rate.  Pulmonary:     Effort: Pulmonary effort is normal.  Skin:    General: Skin is warm and dry.     Findings: No erythema or rash.  Neurological:     Mental Status: She is alert and oriented to person, place, and time.  Psychiatric:        Mood and Affect: Mood normal.        Behavior: Behavior normal.        Thought Content: Thought content normal.     Comments: Well groomed, good eye contact, normal speech and thoughts     Results for orders placed or performed in visit on 12/19/21  POCT glycosylated hemoglobin (Hb A1C)  Result Value Ref Range   Hemoglobin A1C 6.9 (A) 4.0 - 5.6 %   HbA1c POC (<> result, manual entry)     HbA1c, POC (prediabetic range)  HbA1c, POC (controlled diabetic range)        Assessment & Plan:   Problem List Items Addressed This Visit     Type 2 Diabetes with other specified complication ([place])   Morbid obesity BMI 40.0-44.9, adult ([place]) - Primary    POC A1c today shows A1c 6.9 Prior readings >6.5, classify patient today as Type 2 Diabetic  Start sample ozempic GLP1 0.25mg  weekly x 4 weeks then 0.5mg  x 2 weeks free sample, she can notify us via mychart when ready for new rx for 0.5mg  weekly 84 day, reviewed benefit and risk side effects   Orders Placed This Encounter  Procedures   POCT glycosylated hemoglobin (Hb A1C)      No orders of the defined types were placed in this encounter.     Follow up plan: Return in about 4 months (around 04/18/2022) for 4 month follow-up Weight, PreDM A1c.  [name], [facility] 12/19/2021, 9:49 AM

## 2021-12-25 ENCOUNTER — Encounter: Payer: Self-pay | Admitting: Family Medicine

## 2021-12-25 DIAGNOSIS — R7303 Prediabetes: Secondary | ICD-10-CM

## 2021-12-25 MED ORDER — ACCU-CHEK GUIDE VI STRP: ORAL_STRIP | 5 refills | Status: DC

## 2021-12-25 NOTE — Addendum Note (Signed)
Addended by: Olin Hauser on: 12/25/2021 06:07 PM   Modules accepted: Orders

## 2021-12-27 ENCOUNTER — Encounter: Payer: Self-pay | Admitting: Family Medicine

## 2021-12-27 DIAGNOSIS — E1169 Type 2 diabetes mellitus with other specified complication: Secondary | ICD-10-CM

## 2021-12-27 MED ORDER — OZEMPIC (0.25 OR 0.5 MG/DOSE) 2 MG/1.5ML ~~LOC~~ SOPN: 0.2500 mg | PEN_INJECTOR | SUBCUTANEOUS | 1 refills | Status: DC

## 2021-12-28 ENCOUNTER — Other Ambulatory Visit: Payer: Self-pay | Admitting: Pulmonary Disease

## 2021-12-28 NOTE — Telephone Encounter (Signed)
Patient is requesting refill on albuterol HFA.  This medication is not mentioned in previous notes.  Dr. Patsey Berthold, please advise if okay to refill? Thanks

## 2021-12-28 NOTE — Telephone Encounter (Signed)
Okay to refill albuterol.

## 2022-01-04 DIAGNOSIS — Z79899 Other long term (current) drug therapy: Secondary | ICD-10-CM | POA: Diagnosis not present

## 2022-01-04 DIAGNOSIS — F4310 Post-traumatic stress disorder, unspecified: Secondary | ICD-10-CM | POA: Diagnosis not present

## 2022-01-05 ENCOUNTER — Encounter: Payer: Self-pay | Admitting: Family Medicine

## 2022-01-08 ENCOUNTER — Other Ambulatory Visit: Payer: Self-pay | Admitting: Obstetrics and Gynecology

## 2022-01-10 ENCOUNTER — Inpatient Hospital Stay: Payer: Medicare Other

## 2022-01-12 ENCOUNTER — Inpatient Hospital Stay: Payer: Medicare Other | Attending: Oncology

## 2022-01-12 ENCOUNTER — Other Ambulatory Visit: Payer: Self-pay

## 2022-01-12 ENCOUNTER — Other Ambulatory Visit: Payer: Self-pay | Admitting: Family Medicine

## 2022-01-12 DIAGNOSIS — R7303 Prediabetes: Secondary | ICD-10-CM

## 2022-01-12 DIAGNOSIS — E538 Deficiency of other specified B group vitamins: Secondary | ICD-10-CM | POA: Insufficient documentation

## 2022-01-12 DIAGNOSIS — D509 Iron deficiency anemia, unspecified: Secondary | ICD-10-CM

## 2022-01-12 MED ORDER — CYANOCOBALAMIN 1000 MCG/ML IJ SOLN
1000.0000 ug | Freq: Once | INTRAMUSCULAR | Status: AC
  Administered 2022-01-12: 1000 ug via INTRAMUSCULAR
  Filled 2022-01-12: qty 1

## 2022-01-12 NOTE — Telephone Encounter (Signed)
Requested Prescriptions  Pending Prescriptions Disp Refills   glucose blood (ACCU-CHEK GUIDE) test strip [Pharmacy Med Name: ACCU-CHEK GUIDE TEST STRIPS 100S] 200 strip 5    Sig: CHECK BLOOD SUGAR UP TO EVERY DAY AS DIRECTED     Endocrinology: Diabetes - Testing Supplies Passed - 01/12/2022  2:58 PM      Passed - Valid encounter within last 12 months    Recent Outpatient Visits          3 weeks ago Morbid obesity with BMI of 40.0-44.9, adult [facility])   [place], DO   3 months ago Trigeminal neuralgia   [place], DO   3 months ago Needs flu shot   [place], DO   5 months ago Right facial swelling   [place], [place]   11 months ago Atypical pneumonia   [place], DO      Future Appointments            In 2 months [facility], [facility]

## 2022-01-20 ENCOUNTER — Encounter: Payer: Self-pay | Admitting: Family Medicine

## 2022-01-20 ENCOUNTER — Telehealth: Payer: Medicare Other | Admitting: Physician Assistant

## 2022-01-20 DIAGNOSIS — U071 COVID-19: Secondary | ICD-10-CM | POA: Diagnosis not present

## 2022-01-20 MED ORDER — BENZONATATE 100 MG PO CAPS: 100.0000 mg | ORAL_CAPSULE | Freq: Three times a day (TID) | ORAL | 0 refills | Status: DC | PRN

## 2022-01-20 MED ORDER — MOLNUPIRAVIR EUA 200MG CAPSULE: 4.0000 | ORAL_CAPSULE | Freq: Two times a day (BID) | ORAL | 0 refills | Status: AC

## 2022-01-20 NOTE — Progress Notes (Signed)
Virtual Visit Consent   Amanda Webb, you are scheduled for a virtual visit with a [facility] provider today.     Just as with appointments in the office, your consent must be obtained to participate.  Your consent will be active for this visit and any virtual visit you may have with one of our providers in the next 365 days.     If you have a MyChart account, a copy of this consent can be sent to you electronically.  All virtual visits are billed to your insurance company just like a traditional visit in the office.    As this is a virtual visit, video technology does not allow for your provider to perform a traditional examination.  This may limit your provider's ability to fully assess your condition.  If your provider identifies any concerns that need to be evaluated in person or the need to arrange testing (such as labs, EKG, etc.), we will make arrangements to do so.     Although advances in technology are sophisticated, we cannot ensure that it will always work on either your end or our end.  If the connection with a video visit is poor, the visit may have to be switched to a telephone visit.  With either a video or telephone visit, we are not always able to ensure that we have a secure connection.     I need to obtain your verbal consent now.   Are you willing to proceed with your visit today?    Amanda Webb has provided verbal consent on 01/20/2022 for a virtual visit (video or telephone).   Amanda Webb, [place]   Date: 01/20/2022 4:17 PM   Virtual Visit via Video Note   I, Amanda Webb, connected with  Amanda Webb  ([id number], [birth year]) on 01/20/22 at  4:15 PM EST by a video-enabled telemedicine application and verified that I am speaking with the correct person using two identifiers.  Location: Patient: Virtual Visit Location Patient: Home Provider: Virtual Visit Location Provider: Home Office   I discussed the limitations of evaluation and management by  telemedicine and the availability of in person appointments. The patient expressed understanding and agreed to proceed.    History of Present Illness: Amanda Webb is a 51 y.o. who identifies as a female who was assigned female at birth, and is being seen today for COVID-19.  Endorses symptoms starting yesterday with more significant fatigue, now with chest congestion and cough, chills, chest heaviness. Denies fever, chest pain, SOB. Some slight loose stools but recently started Ozempic. Relative with COVID and she was exposed to them. Tested positive as of today.    HPI  Problems:  Patient Active Problem List   Diagnosis Date Noted   Major depressive disorder, recurrent, in partial remission ([place]) 11/08/2020   Bronchitis 10/21/2020   Cough 10/21/2020   Acute non-recurrent sinusitis 08/24/2020   Status post laparoscopic cholecystectomy 07/28/2020   Morbid obesity ([place]) 07/05/2020   Hepatic steatosis 06/23/2020   Lymphedema 03/09/2020   Chronic venous insufficiency 03/09/2020   Bilateral lower extremity edema 02/16/2020   Female cystocele 10/21/2018   Urinary incontinence, mixed 10/21/2018   B12 deficiency 04/08/2018   Iron deficiency anemia 03/14/2018   GAD (generalized anxiety disorder) 03/10/2018   Acute right-sided low back pain 10/28/2017   Tobacco abuse 05/09/2017   Multiple thyroid nodules 12/28/2016   Simple chronic bronchitis ([place]) 12/13/2016   BMI 40.0-44.9, adult ([place]) 09/26/2016   HLD (hyperlipidemia)  09/26/2016   Vitamin D deficiency 09/26/2016   Pre-diabetes 06/17/2015   Candidal intertrigo 02/21/2015   Candidiasis of skin and nail 02/21/2015   Cellulitis and abscess of leg 02/18/2015   Benign cyst of right kidney 01/17/2015   Wheezing 01/11/2015   Chronic sinusitis 11/15/2014   Pneumonia due to infectious organism 11/02/2014   Whiplash injury 09/03/2014   Heart palpitations 04/02/2014   Lower urinary tract infectious disease 02/16/2014   Allergic rhinitis  12/31/2013   Spondylosis of lumbar region without myelopathy or radiculopathy 12/28/2013   Diarrhea 11/09/2013   Chronic low back pain 09/28/2013   Other fatigue 09/28/2013   Hypothyroidism 09/28/2013   Alteration of body temperature 09/28/2013   Plantar fasciitis, bilateral 07/10/2013   Tarsal tunnel syndrome of right side 07/10/2013   Plantar fascial fibromatosis 07/10/2013   Encounter for surveillance of injectable contraceptive 05/13/2013   Encounter for Depo-Provera contraception 05/13/2013   Diuresis excessive 03/24/2013   Abnormal uterine and vaginal bleeding, unspecified 03/24/2013   Polyuria 03/24/2013   Arthritis, multiple joint involvement 03/17/2013   Fibroblastic disorder 07/28/2012   Borderline personality disorder ([place]) 12/17/2010   Essential hypertension 02/18/2010   Carpal tunnel syndrome on both sides 12/17/2006   Major depressive disorder, single episode 02/21/1998   Disk prolapse 04/17/1995   Depression with anxiety 03/18/1995   OCD (obsessive compulsive disorder) 03/18/1995   Severe anxiety with panic 03/18/1995    Allergies:  Allergies  Allergen Reactions   Bee Venom Anaphylaxis, Hives and Swelling    Carries Epi pen.    Cat Hair Extract Itching, Other (See Comments) and Swelling    Allergic to trees, nuts, wheat, grass, cats & dogs - itchy watery eyes, swelling. Uses Zyrtec & Flonase & Benadryl if really bad. Used to get allergy shots. Allergic to trees, nuts, wheat, grass, cats & dogs - itchy watery eyes, swelling. Uses Zyrtec & Flonase & Benadryl if really bad. Used to get allergy shots.   Dog Epithelium Cough and Shortness Of Breath   Dog Epithelium Allergy Skin Test Shortness Of Breath   Dog Fennel Cough and Shortness Of Breath   Dog Fennel Allergy Skin Test Shortness Of Breath   Dust Mite Extract Cough and Shortness Of Breath   Tetracyclines & Related Nausea And Vomiting   Lac Bovis Diarrhea and Nausea And Vomiting   Lactose Diarrhea and Nausea  And Vomiting   Milk Protein Diarrhea and Nausea And Vomiting   Tape Other (See Comments) and Rash    Needs to use paper tape. Breaks out with severe rash, pulls skin off when using adhesive.   Medications:  Current Outpatient Medications:    benzonatate (TESSALON) 100 MG capsule, Take 1 capsule (100 mg total) by mouth 3 (three) times daily as needed for cough., Disp: 30 capsule, Rfl: 0   molnupiravir EUA (LAGEVRIO) 200 mg CAPS capsule, Take 4 capsules (800 mg total) by mouth 2 (two) times daily for 5 days., Disp: 40 capsule, Rfl: 0   Accu-Chek FastClix Lancets MISC, USE TO CHECK SUGAR UP TO TWICE DAILY AS DIRECTED, Disp: 102 each, Rfl: 5   albuterol (VENTOLIN HFA) 108 (90 Base) MCG/ACT inhaler, INHALE 1 TO 2 PUFFS BY MOUTH EVERY 4 TO 6 HOURS AS NEEDED FOR SHORTNESS OF BREATH, Disp: 18 g, Rfl: 2   ARIPiprazole (ABILIFY) 2 MG tablet, Take 2 mg by mouth every morning. , Disp: , Rfl:    atenolol (TENORMIN) 50 MG tablet, TAKE 1 TABLET(50 MG) BY MOUTH DAILY, Disp: 90 tablet, Rfl:  1   azelastine (ASTELIN) 0.1 % nasal spray, INSTILL 1 SPRAY INTO BOTH NOSTRILS TWICE DAILY AS DIRECTED, Disp: 30 mL, Rfl: 5   Cetirizine HCl 10 MG CAPS, Take 10 mg by mouth daily with lunch. , Disp: , Rfl:    Cyanocobalamin (B-12 COMPLIANCE INJECTION IJ), Inject 1 Dose as directed every 30 (thirty) days., Disp: , Rfl:    fluticasone (FLONASE) 50 MCG/ACT nasal spray, SHAKE LIQUID AND USE 2 SPRAYS IN EACH NOSTRIL EVERY DAY, Disp: 48 g, Rfl: 1   Fluticasone-Umeclidin-Vilant (TRELEGY ELLIPTA) 100-62.5-25 MCG/INH AEPB, Inhale into the lungs., Disp: , Rfl:    fluvoxaMINE (LUVOX) 100 MG tablet, Take 150 mg by mouth every morning. , Disp: , Rfl:    gabapentin (NEURONTIN) 300 MG capsule, Take 1 capsule (300 mg total) by mouth 3 (three) times daily., Disp: 90 capsule, Rfl: 3   glucose blood (ACCU-CHEK GUIDE) test strip, CHECK BLOOD SUGAR UP TO EVERY DAY AS DIRECTED, Disp: 200 strip, Rfl: 5   ketoconazole (NIZORAL) 2 % shampoo, APPLY  EXTERNALLY 2 TIMES A WEEK, Disp: 120 mL, Rfl: 2   lamoTRIgine (LAMICTAL) 200 MG tablet, Take 200 mg by mouth every morning. , Disp: , Rfl:    levothyroxine (SYNTHROID) 50 MCG tablet, Take 1 tablet (50 mcg total) by mouth daily before breakfast., Disp: 90 tablet, Rfl: 1   medroxyPROGESTERone (DEPO-PROVERA) 150 MG/ML injection, Bring depo injection to the office each visit., Disp: 1 mL, Rfl: 1   Multiple Vitamin (MULTIVITAMIN WITH MINERALS) TABS tablet, Take 1 tablet by mouth daily., Disp: , Rfl:    nystatin-triamcinolone (MYCOLOG II) cream, Apply 1 application topically 2 (two) times daily. For 10-14 days, Disp: 60 g, Rfl: 0   oxybutynin (DITROPAN-XL) 5 MG 24 hr tablet, TAKE 1 TABLET BY MOUTH EVERY DAY, Disp: 90 tablet, Rfl: 0   OZEMPIC, 0.25 OR 0.5 MG/DOSE, 2 MG/1.5ML SOPN, Inject 0.25 mg into the skin once a week. For first 4 weeks. Then increase dose to 0.5mg  weekly, Disp: 4.5 mL, Rfl: 1   pantoprazole (PROTONIX) 20 MG tablet, TAKE 1 TABLET(20 MG) BY MOUTH DAILY, Disp: 90 tablet, Rfl: 1  Observations/Objective: Patient is well-developed, well-nourished in no acute distress.  Resting comfortably at home.  Head is normocephalic, atraumatic.  No labored breathing. Speech is clear and coherent with logical content.  Patient is alert and oriented at baseline.   Assessment and Plan: 1. COVID-19 - benzonatate (TESSALON) 100 MG capsule; Take 1 capsule (100 mg total) by mouth 3 (three) times daily as needed for cough.  Dispense: 30 capsule; Refill: 0 - MyChart COVID-19 home monitoring program; Future - molnupiravir EUA (LAGEVRIO) 200 mg CAPS capsule; Take 4 capsules (800 mg total) by mouth 2 (two) times daily for 5 days.  Dispense: 40 capsule; Refill: 0  Patient with multiple risk factors for complicated course of illness. Discussed risks/benefits of antiviral medications including most common potential ADRs. Patient voiced understanding and would like to proceed with antiviral medication. They are  candidate for molnupiravir. Rx sent to pharmacy. Supportive measures, OTC medications and vitamin regimen reviewed. Tessalon per orders. Patient has been enrolled in a MyChart COVID symptom monitoring program. Samule Dry reviewed in detail. Strict ER precautions discussed with patient.    Follow Up Instructions: I discussed the assessment and treatment plan with the patient. The patient was provided an opportunity to ask questions and all were answered. The patient agreed with the plan and demonstrated an understanding of the instructions.  A copy of instructions were sent to  the patient via MyChart unless otherwise noted below.   The patient was advised to call back or seek an in-person evaluation if the symptoms worsen or if the condition fails to improve as anticipated.  Time:  I spent 10 minutes with the patient via telehealth technology discussing the above problems/concerns.    Amanda Rio, PA-C

## 2022-01-20 NOTE — Patient Instructions (Signed)
Amanda Webb, thank you for joining Leeanne Rio, PA-C for today's virtual visit.  While this provider is not your primary care provider (PCP), if your PCP is located in our provider database this encounter information will be shared with them immediately following your visit.  Consent: (Patient) Amanda Webb provided verbal consent for this virtual visit at the beginning of the encounter.  Current Medications:  Current Outpatient Medications:    Accu-Chek FastClix Lancets MISC, USE TO CHECK SUGAR UP TO TWICE DAILY AS DIRECTED, Disp: 102 each, Rfl: 5   albuterol (VENTOLIN HFA) 108 (90 Base) MCG/ACT inhaler, INHALE 1 TO 2 PUFFS BY MOUTH EVERY 4 TO 6 HOURS AS NEEDED FOR SHORTNESS OF BREATH, Disp: 18 g, Rfl: 2   ARIPiprazole (ABILIFY) 2 MG tablet, Take 2 mg by mouth every morning. , Disp: , Rfl:    atenolol (TENORMIN) 50 MG tablet, TAKE 1 TABLET(50 MG) BY MOUTH DAILY, Disp: 90 tablet, Rfl: 1   azelastine (ASTELIN) 0.1 % nasal spray, INSTILL 1 SPRAY INTO BOTH NOSTRILS TWICE DAILY AS DIRECTED, Disp: 30 mL, Rfl: 5   Cetirizine HCl 10 MG CAPS, Take 10 mg by mouth daily with lunch. , Disp: , Rfl:    Cyanocobalamin (B-12 COMPLIANCE INJECTION IJ), Inject 1 Dose as directed every 30 (thirty) days., Disp: , Rfl:    fluticasone (FLONASE) 50 MCG/ACT nasal spray, SHAKE LIQUID AND USE 2 SPRAYS IN EACH NOSTRIL EVERY DAY, Disp: 48 g, Rfl: 1   Fluticasone-Umeclidin-Vilant (TRELEGY ELLIPTA) 100-62.5-25 MCG/INH AEPB, Inhale into the lungs., Disp: , Rfl:    fluvoxaMINE (LUVOX) 100 MG tablet, Take 150 mg by mouth every morning. , Disp: , Rfl:    gabapentin (NEURONTIN) 300 MG capsule, Take 1 capsule (300 mg total) by mouth 3 (three) times daily., Disp: 90 capsule, Rfl: 3   glucose blood (ACCU-CHEK GUIDE) test strip, CHECK BLOOD SUGAR UP TO EVERY DAY AS DIRECTED, Disp: 200 strip, Rfl: 5   ketoconazole (NIZORAL) 2 % shampoo, APPLY EXTERNALLY 2 TIMES A WEEK, Disp: 120 mL, Rfl: 2   lamoTRIgine (LAMICTAL) 200  MG tablet, Take 200 mg by mouth every morning. , Disp: , Rfl:    levothyroxine (SYNTHROID) 50 MCG tablet, Take 1 tablet (50 mcg total) by mouth daily before breakfast., Disp: 90 tablet, Rfl: 1   medroxyPROGESTERone (DEPO-PROVERA) 150 MG/ML injection, Bring depo injection to the office each visit., Disp: 1 mL, Rfl: 1   Multiple Vitamin (MULTIVITAMIN WITH MINERALS) TABS tablet, Take 1 tablet by mouth daily., Disp: , Rfl:    nystatin-triamcinolone (MYCOLOG II) cream, Apply 1 application topically 2 (two) times daily. For 10-14 days, Disp: 60 g, Rfl: 0   oxybutynin (DITROPAN-XL) 5 MG 24 hr tablet, TAKE 1 TABLET BY MOUTH EVERY DAY, Disp: 90 tablet, Rfl: 0   OZEMPIC, 0.25 OR 0.5 MG/DOSE, 2 MG/1.5ML SOPN, Inject 0.25 mg into the skin once a week. For first 4 weeks. Then increase dose to 0.5mg  weekly, Disp: 4.5 mL, Rfl: 1   pantoprazole (PROTONIX) 20 MG tablet, TAKE 1 TABLET(20 MG) BY MOUTH DAILY, Disp: 90 tablet, Rfl: 1   Medications ordered in this encounter:  No orders of the defined types were placed in this encounter.    *If you need refills on other medications prior to your next appointment, please contact your pharmacy*  Follow-Up: Call back or seek an in-person evaluation if the symptoms worsen or if the condition fails to improve as anticipated.  Other Instructions Please keep well-hydrated and get plenty of  rest. Start a saline nasal rinse to flush out your nasal passages. You can use plain Mucinex to help thin congestion. If you have a humidifier, running in the bedroom at night. I want you to start OTC vitamin D3 1000 units daily, vitamin C 1000 mg daily, and a zinc supplement. Please take prescribed medications as directed.  You have been enrolled in a MyChart symptom monitoring program. Please answer these questions daily so we can keep track of how you are doing.  You were to quarantine for 5 days from onset of your symptoms.  After day 5, if you have had no fever and you are  feeling better, you can end quarantine but need to mask for an additional 5 days. After day 5 if you have a fever or are having significant symptoms, please quarantine for full 10 days.  If you note any worsening of symptoms, any significant shortness of breath or any chest pain, please seek ER evaluation ASAP.  Please do not delay care!  COVID-19: What to Do if You Are Sick If you test positive and are an older adult or someone who is at high risk of getting very sick from COVID-19, treatment may be available. Contact a healthcare provider right away after a positive test to determine if you are eligible, even if your symptoms are mild right now. You can also visit a Test to Treat location and, if eligible, receive a prescription from a provider. Don't delay: Treatment must be started within the first few days to be effective. If you have a fever, cough, or other symptoms, you might have COVID-19. Most people have mild illness and are able to recover at home. If you are sick: Keep track of your symptoms. If you have an emergency warning sign (including trouble breathing), call 911. Steps to help prevent the spread of COVID-19 if you are sick If you are sick with COVID-19 or think you might have COVID-19, follow the steps below to care for yourself and to help protect other people in your home and community. Stay home except to get medical care Stay home. Most people with COVID-19 have mild illness and can recover at home without medical care. Do not leave your home, except to get medical care. Do not visit public areas and do not go to places where you are unable to wear a mask. Take care of yourself. Get rest and stay hydrated. Take over-the-counter medicines, such as acetaminophen, to help you feel better. Stay in touch with your doctor. Call before you get medical care. Be sure to get care if you have trouble breathing, or have any other emergency warning signs, or if you think it is an  emergency. Avoid public transportation, ride-sharing, or taxis if possible. Get tested If you have symptoms of COVID-19, get tested. While waiting for test results, stay away from others, including staying apart from those living in your household. Get tested as soon as possible after your symptoms start. Treatments may be available for people with COVID-19 who are at risk for becoming very sick. Don't delay: Treatment must be started early to be effective--some treatments must begin within 5 days of your first symptoms. Contact your healthcare provider right away if your test result is positive to determine if you are eligible. Self-tests are one of several options for testing for the virus that causes COVID-19 and may be more convenient than laboratory-based tests and point-of-care tests. Ask your healthcare provider or your local health department if you  need help interpreting your test results. You can visit your state, tribal, local, and territorial health department's website to look for the latest local information on testing sites. Separate yourself from other people As much as possible, stay in a specific room and away from other people and pets in your home. If possible, you should use a separate bathroom. If you need to be around other people or animals in or outside of the home, wear a well-fitting mask. Tell your close contacts that they may have been exposed to COVID-19. An infected person can spread COVID-19 starting 48 hours (or 2 days) before the person has any symptoms or tests positive. By letting your close contacts know they may have been exposed to COVID-19, you are helping to protect everyone. See COVID-19 and Animals if you have questions about pets. If you are diagnosed with COVID-19, someone from the health department may call you. Answer the call to slow the spread. Monitor your symptoms Symptoms of COVID-19 include fever, cough, or other symptoms. Follow care instructions  from your healthcare provider and local health department. Your local health authorities may give instructions on checking your symptoms and reporting information. When to seek emergency medical attention Look for emergency warning signs* for COVID-19. If someone is showing any of these signs, seek emergency medical care immediately: Trouble breathing Persistent pain or pressure in the chest New confusion Inability to wake or stay awake Pale, gray, or blue-colored skin, lips, or nail beds, depending on skin tone *This list is not all possible symptoms. Please call your medical provider for any other symptoms that are severe or concerning to you. Call 911 or call ahead to your local emergency facility: Notify the operator that you are seeking care for someone who has or may have COVID-19. Call ahead before visiting your doctor Call ahead. Many medical visits for routine care are being postponed or done by phone or telemedicine. If you have a medical appointment that cannot be postponed, call your doctor's office, and tell them you have or may have COVID-19. This will help the office protect themselves and other patients. If you are sick, wear a well-fitting mask You should wear a mask if you must be around other people or animals, including pets (even at home). Wear a mask with the best fit, protection, and comfort for you. You don't need to wear the mask if you are alone. If you can't put on a mask (because of trouble breathing, for example), cover your coughs and sneezes in some other way. Try to stay at least 6 feet away from other people. This will help protect the people around you. Masks should not be placed on young children under age 75 years, anyone who has trouble breathing, or anyone who is not able to remove the mask without help. Cover your coughs and sneezes Cover your mouth and nose with a tissue when you cough or sneeze. Throw away used tissues in a lined trash can. Immediately  wash your hands with soap and water for at least 20 seconds. If soap and water are not available, clean your hands with an alcohol-based hand sanitizer that contains at least 60% alcohol. Clean your hands often Wash your hands often with soap and water for at least 20 seconds. This is especially important after blowing your nose, coughing, or sneezing; going to the bathroom; and before eating or preparing food. Use hand sanitizer if soap and water are not available. Use an alcohol-based hand sanitizer with at least  60% alcohol, covering all surfaces of your hands and rubbing them together until they feel dry. Soap and water are the best option, especially if hands are visibly dirty. Avoid touching your eyes, nose, and mouth with unwashed hands. Handwashing Tips Avoid sharing personal household items Do not share dishes, drinking glasses, cups, eating utensils, towels, or bedding with other people in your home. Wash these items thoroughly after using them with soap and water or put in the dishwasher. Clean surfaces in your home regularly Clean and disinfect high-touch surfaces (for example, doorknobs, tables, handles, light switches, and countertops) in your "sick room" and bathroom. In shared spaces, you should clean and disinfect surfaces and items after each use by the person who is ill. If you are sick and cannot clean, a caregiver or other person should only clean and disinfect the area around you (such as your bedroom and bathroom) on an as needed basis. Your caregiver/other person should wait as long as possible (at least several hours) and wear a mask before entering, cleaning, and disinfecting shared spaces that you use. Clean and disinfect areas that may have blood, stool, or body fluids on them. Use household cleaners and disinfectants. Clean visible dirty surfaces with household cleaners containing soap or detergent. Then, use a household disinfectant. Use a product from [employer] List N:  Disinfectants for Coronavirus (LPFXT-02). Be sure to follow the instructions on the label to ensure safe and effective use of the product. Many products recommend keeping the surface wet with a disinfectant for a certain period of time (look at "contact time" on the product label). You may also need to wear personal protective equipment, such as gloves, depending on the directions on the product label. Immediately after disinfecting, wash your hands with soap and water for 20 seconds. For completed guidance on cleaning and disinfecting your home, visit Complete Disinfection Guidance. Take steps to improve ventilation at home Improve ventilation (air flow) at home to help prevent from spreading COVID-19 to other people in your household. Clear out COVID-19 virus particles in the air by opening windows, using air filters, and turning on fans in your home. Use this interactive tool to learn how to improve air flow in your home. When you can be around others after being sick with COVID-19 Deciding when you can be around others is different for different situations. Find out when you can safely end home isolation. For any additional questions about your care, contact your healthcare provider or state or local health department. 03/07/2021 Content source: [facility] for Immunization and Respiratory Diseases ([facility]), Division of Viral Diseases This information is not intended to replace advice given to you by your health care provider. Make sure you discuss any questions you have with your health care provider. Document Revised: 04/20/2021 Document Reviewed: 04/20/2021 Elsevier Patient Education  2022 [employer].      If you have been instructed to have an in-person evaluation today at a local Urgent Care facility, please use the link below. It will take you to a list of all of our available [facility], including address, phone number and hours of operation. Please do not delay  care.  [facility]  If you or a family member do not have a primary care provider, use the link below to schedule a visit and establish care. When you choose a [facility] primary care physician or advanced practice provider, you gain a long-term partner in health. Find a Primary Care Provider  Learn  more about [facility]'s in-office and virtual care options: [facility]

## 2022-01-22 ENCOUNTER — Telehealth: Payer: Self-pay

## 2022-01-22 NOTE — Telephone Encounter (Signed)
BPA triggered for worsening symptoms appetite and new cough. She says she doesn't have a cough today and she's feeling better today. She's on tessalon pearles and the antiviral, she says this is day 3 and she's improving. Advised I will send advice in MyChart, she verbalized understanding.

## 2022-01-24 DIAGNOSIS — Z20822 Contact with and (suspected) exposure to covid-19: Secondary | ICD-10-CM | POA: Diagnosis not present

## 2022-01-26 ENCOUNTER — Encounter: Payer: Self-pay | Admitting: Urgent Care

## 2022-01-29 ENCOUNTER — Other Ambulatory Visit: Payer: Self-pay

## 2022-01-29 ENCOUNTER — Ambulatory Visit (INDEPENDENT_AMBULATORY_CARE_PROVIDER_SITE_OTHER): Payer: Medicare Other

## 2022-01-29 VITALS — Ht 67.0 in

## 2022-01-29 DIAGNOSIS — Z3042 Encounter for surveillance of injectable contraceptive: Secondary | ICD-10-CM

## 2022-01-29 DIAGNOSIS — Z1231 Encounter for screening mammogram for malignant neoplasm of breast: Secondary | ICD-10-CM | POA: Diagnosis not present

## 2022-01-29 MED ORDER — MEDROXYPROGESTERONE ACETATE 150 MG/ML IM SUSP
150.0000 mg | INTRAMUSCULAR | Status: AC
  Administered 2022-01-29: 150 mg via INTRAMUSCULAR

## 2022-01-29 NOTE — Progress Notes (Unsigned)
Date last pap: 09/27/2020. Last Depo-Provera: 11/13/2021 Side Effects if any: None. Serum HCG indicated? N/A. Depo-Provera 150 mg IM given by: Douglass Rivers, CMA Next appointment due: May 1- Apr 30, 2022

## 2022-02-02 ENCOUNTER — Encounter: Payer: Self-pay | Admitting: Family Medicine

## 2022-02-04 ENCOUNTER — Other Ambulatory Visit: Payer: Self-pay | Admitting: Family Medicine

## 2022-02-04 DIAGNOSIS — I10 Essential (primary) hypertension: Secondary | ICD-10-CM

## 2022-02-05 NOTE — Telephone Encounter (Signed)
Requested Prescriptions  Pending Prescriptions Disp Refills   atenolol (TENORMIN) 50 MG tablet [Pharmacy Med Name: ATENOLOL 50MG  TABLETS] 90 tablet 1    Sig: TAKE 1 TABLET(50 MG) BY MOUTH DAILY     Cardiovascular: Beta Blockers 2 Failed - 02/04/2022  6:31 AM      Failed - Cr in normal range and within 360 days    Creat  Date Value Ref Range Status  11/08/2020 0.64 0.50 - 1.10 mg/dL Final         Passed - Last BP in normal range    BP Readings from Last 1 Encounters:  10/06/21 138/76         Passed - Last Heart Rate in normal range    Pulse Readings from Last 1 Encounters:  12/19/21 67         Passed - Valid encounter within last 6 months    Recent Outpatient Visits          1 month ago Morbid obesity with BMI of 40.0-44.9, adult [facility])   [facility] Olin Hauser, DO   4 months ago Trigeminal neuralgia   [place], [place]   4 months ago Needs flu shot   [place], DO   6 months ago Right facial swelling   [place], [place]   11 months ago Atypical pneumonia   [place], DO      Future Appointments            In 1 month [facility], [facility]             Requesting too soon. Filled 01/14/22 for 90 day supply

## 2022-02-07 ENCOUNTER — Inpatient Hospital Stay: Payer: Medicare Other

## 2022-02-12 ENCOUNTER — Other Ambulatory Visit: Payer: Self-pay | Admitting: Family Medicine

## 2022-02-12 ENCOUNTER — Inpatient Hospital Stay: Payer: Medicare Other | Attending: Oncology

## 2022-02-12 ENCOUNTER — Other Ambulatory Visit: Payer: Self-pay

## 2022-02-12 DIAGNOSIS — E538 Deficiency of other specified B group vitamins: Secondary | ICD-10-CM | POA: Diagnosis not present

## 2022-02-12 DIAGNOSIS — E039 Hypothyroidism, unspecified: Secondary | ICD-10-CM

## 2022-02-12 DIAGNOSIS — D509 Iron deficiency anemia, unspecified: Secondary | ICD-10-CM

## 2022-02-12 MED ORDER — CYANOCOBALAMIN 1000 MCG/ML IJ SOLN
1000.0000 ug | Freq: Once | INTRAMUSCULAR | Status: AC
  Administered 2022-02-12: 1000 ug via INTRAMUSCULAR
  Filled 2022-02-12: qty 1

## 2022-02-13 NOTE — Telephone Encounter (Signed)
Requested medication (s) are due for refill today: No  Requested medication (s) are on the active medication list: yes  Last refill:  10/05/21 #90/1  Future visit scheduled: yes  Notes to clinic:  Unable to refill per protocol due to failed labs, no updated results.      Requested Prescriptions  Pending Prescriptions Disp Refills   levothyroxine (SYNTHROID) 50 MCG tablet [Pharmacy Med Name: LEVOTHYROXINE 0.05MG  (50MCG) TAB] 90 tablet 1    Sig: TAKE 1 TABLET(50 MCG) BY MOUTH DAILY BEFORE AND BREAKFAST     Endocrinology:  Hypothyroid Agents Failed - 02/12/2022 12:01 PM      Failed - TSH in normal range and within 360 days    TSH  Date Value Ref Range Status  11/08/2020 2.28 mIU/L Final    Comment:              Reference Range .           > or = 20 Years  0.40-4.50 .                Pregnancy Ranges           First trimester    0.26-2.66           Second trimester   0.55-2.73           Third trimester    0.43-2.91           Passed - Valid encounter within last 12 months    Recent Outpatient Visits           1 month ago Morbid obesity with BMI of 40.0-44.9, adult [facility])   [place], DO   4 months ago Trigeminal neuralgia   [place], DO   4 months ago Needs flu shot   [place], DO   6 months ago Right facial swelling   [place], DO   12 months ago Atypical pneumonia   [place], Devonne Dought[place], DO       Future Appointments             In 1 month [facility], [facility]

## 2022-02-15 DIAGNOSIS — F4310 Post-traumatic stress disorder, unspecified: Secondary | ICD-10-CM | POA: Diagnosis not present

## 2022-02-19 DIAGNOSIS — Z79899 Other long term (current) drug therapy: Secondary | ICD-10-CM | POA: Diagnosis not present

## 2022-02-19 DIAGNOSIS — Z20822 Contact with and (suspected) exposure to covid-19: Secondary | ICD-10-CM | POA: Diagnosis not present

## 2022-02-19 DIAGNOSIS — I10 Essential (primary) hypertension: Secondary | ICD-10-CM | POA: Diagnosis not present

## 2022-02-19 DIAGNOSIS — R112 Nausea with vomiting, unspecified: Secondary | ICD-10-CM | POA: Diagnosis not present

## 2022-02-19 DIAGNOSIS — F329 Major depressive disorder, single episode, unspecified: Secondary | ICD-10-CM | POA: Diagnosis not present

## 2022-02-19 DIAGNOSIS — K529 Noninfective gastroenteritis and colitis, unspecified: Secondary | ICD-10-CM | POA: Diagnosis not present

## 2022-02-19 DIAGNOSIS — K219 Gastro-esophageal reflux disease without esophagitis: Secondary | ICD-10-CM | POA: Diagnosis not present

## 2022-02-19 DIAGNOSIS — Z87891 Personal history of nicotine dependence: Secondary | ICD-10-CM | POA: Diagnosis not present

## 2022-02-19 DIAGNOSIS — E876 Hypokalemia: Secondary | ICD-10-CM | POA: Diagnosis not present

## 2022-02-19 DIAGNOSIS — M47896 Other spondylosis, lumbar region: Secondary | ICD-10-CM | POA: Diagnosis not present

## 2022-02-19 DIAGNOSIS — Z8249 Family history of ischemic heart disease and other diseases of the circulatory system: Secondary | ICD-10-CM | POA: Diagnosis not present

## 2022-02-19 DIAGNOSIS — Z79890 Hormone replacement therapy (postmenopausal): Secondary | ICD-10-CM | POA: Diagnosis not present

## 2022-02-19 DIAGNOSIS — F419 Anxiety disorder, unspecified: Secondary | ICD-10-CM | POA: Diagnosis not present

## 2022-02-19 DIAGNOSIS — R197 Diarrhea, unspecified: Secondary | ICD-10-CM | POA: Diagnosis not present

## 2022-02-19 DIAGNOSIS — M797 Fibromyalgia: Secondary | ICD-10-CM | POA: Diagnosis not present

## 2022-02-19 DIAGNOSIS — E039 Hypothyroidism, unspecified: Secondary | ICD-10-CM | POA: Diagnosis not present

## 2022-02-21 ENCOUNTER — Other Ambulatory Visit: Payer: Self-pay

## 2022-02-21 ENCOUNTER — Encounter: Payer: Self-pay | Admitting: Family Medicine

## 2022-02-21 ENCOUNTER — Ambulatory Visit (INDEPENDENT_AMBULATORY_CARE_PROVIDER_SITE_OTHER): Payer: Medicare Other | Admitting: Family Medicine

## 2022-02-21 VITALS — BP 134/75 | HR 79 | Ht 67.0 in | Wt 258.2 lb

## 2022-02-21 DIAGNOSIS — A0811 Acute gastroenteropathy due to Norwalk agent: Secondary | ICD-10-CM

## 2022-02-21 DIAGNOSIS — E876 Hypokalemia: Secondary | ICD-10-CM

## 2022-02-21 MED ORDER — POTASSIUM CHLORIDE CRYS ER 20 MEQ PO TBCR
20.0000 meq | EXTENDED_RELEASE_TABLET | Freq: Every day | ORAL | 0 refills | Status: DC
Start: 2022-02-21 — End: 2022-06-13

## 2022-02-21 NOTE — Patient Instructions (Addendum)
Thank you for coming to the office today.  Take potassium for 1 week.   Norovirus Infection Norovirus infection causes inflammation in the stomach and intestines (gastroenteritis) and food poisoning. It is caused by exposure to a virus from a group of similar viruses called noroviruses. Norovirus spreads very easily from person to person (is very contagious). It often occurs in places where people are in close contact, such as schools, nursing homes, restaurants, and cruise ships. You can get it from food, water, surfaces, or other people who have the virus. Norovirus is also found in the stool (feces) or vomit of infected people. You can spread the infection as soon as you feel sick, and you may continue to be contagious after you recover. What are the causes? This condition is caused by contact with norovirus. You can catch norovirus if you: Eat or drink something that is contaminated with norovirus. Touch surfaces or objects that are contaminated with norovirus and then put your hand in or by your mouth or nose. Have direct contact with an infected person who may or may not still have symptoms. Share food, drink, or utensils with someone who is contagious with norovirus. What are the signs or symptoms? Symptoms usually begin within 12 hours to 2 days after you become infected. Most norovirus symptoms affect the digestive system.Symptoms may include: Nausea, vomiting, and diarrhea. Stomach cramps. Fever. Chills. Headache. Muscle aches and tiredness. How is this diagnosed? This condition may be diagnosed based on: Your symptoms. A physical exam. A stool test. How is this treated? There is no specific treatment for norovirus. Most people get better without treatment in about 2 days. Young children, the elderly, and people who are already sick may take up to 6 days to recover. Follow these instructions at home: Eating and drinking  Drink plenty of water to replace fluids that are lost  through diarrhea and vomiting. This prevents dehydration. Drink enough fluid to keep your urine pale yellow. Drink clear fluids in small amounts as you are able. Clear fluids include water, ice chips, fruit juice with water added (diluted fruit juice), and low-calorie sports drinks. Avoid fluids that contain a lot of sugar or caffeine, such as energy drinks, sports drinks, and soda. Avoid alcohol. If instructed by your health care provider, drink an oral rehydration solution (ORS). This is a drink that is sold at pharmacies and retail stores. An ORS contains minerals (electrolytes) that you can lose through diarrhea and vomiting. Eat bland, easy-to-digest foods in small amounts as you are able. These foods include rice, lean meats, toast, and crackers. Avoid spicy or fatty foods. General instructions Rest at home while you recover. Do not prepare food for others while you are infected. Wait at least 3 days after you recover from the illness to do this. Take over-the-counter and prescription medicines only as told by your health care provider. Wash your hands frequently with soap and water for at least 20 seconds. Alcohol-based hand sanitizer can be used in addition to soap and water, but sanitizer should not be the only cleansing method because it is not effective at removing norovirus from your hands or surfaces. Make sure that each person in your household washes his or her hands well and often. Keep all follow-up visits. This is important. How is this prevented? To help prevent the spread of norovirus: Stay at home if you are feeling sick. This will reduce the risk of spreading the virus to others. Wash your hands often with soap and  water for at least 20 seconds, especially after using the toilet, helping a child use the toilet, or changing a child's diaper. Wash fruits and vegetables thoroughly before peeling, preparing, or serving them. Throw out any food that a sick person may have  touched. Disinfect contaminated surfaces immediately after someone in the household has been sick. Disinfect frequently used surfaces, such as counters, doorknobs, and faucets. Use a bleach-based household cleaner. Immediately remove and wash soiled clothes or sheets. Contact a health care provider if: You have vomiting, diarrhea, or stomach pain that gets worse. You have symptoms that do not go away after 3-6 days. You have a fever. You cannot drink without vomiting. You feel light-headed or dizzy. Your symptoms get worse. Get help right away if: You develop symptoms of dehydration that do not improve with fluid replacement, such as: Excessive sleepiness. Lack of tears. Very little urine production. Dry mouth. Muscle cramps. Weak pulse. Confusion. Summary Norovirus infection is common and often occurs in places where people are in close contact, such as schools, nursing homes, restaurants, and cruise ships. To help prevent the spread of this infection, wash hands with soap and water for at least 20 seconds before handling food or after having contact with stool or body fluids. There is no specific treatment for norovirus, but most people get better without treatment in about 2 days. People who are healthy when infected often recover sooner than those who are elderly, young, or already sick. Replace lost fluids by drinking plenty of water, or by drinking oral rehydration solution (ORS), which contains important minerals called electrolytes. This prevents dehydration. This information is not intended to replace advice given to you by your health care provider. Make sure you discuss any questions you have with your health care provider. Document Revised: 07/12/2021 Document Reviewed: 07/12/2021 Elsevier Patient Education  [place].    Viral Gastroenteritis, Adult Viral gastroenteritis is also known as the stomach flu. This condition may affect your stomach, small intestine, and  large intestine. It can cause sudden watery diarrhea, fever, and vomiting. This condition is caused by many different viruses. These viruses can be passed from person to person very easily (are contagious). Diarrhea and vomiting can make you feel weak and cause you to become dehydrated. You may not be able to keep fluids down. Dehydration can make you tired and thirsty, cause you to have a dry mouth, and decrease how often you urinate. It is important to replace the fluids that you lose from diarrhea and vomiting. What are the causes? Gastroenteritis is caused by many viruses, including rotavirus and norovirus. Norovirus is the most common cause in adults. You can get sick after being exposed to the viruses from other people. You can also get sick by: Eating food, drinking water, or touching a surface contaminated with one of these viruses. Sharing utensils or other personal items with an infected person. What increases the risk? You are more likely to develop this condition if you: Have a weak body defense system (immune system). Live with one or more children who are younger than 80 years old. Live in a nursing home. Travel on cruise ships. What are the signs or symptoms? Symptoms of this condition start suddenly 1-3 days after exposure to a virus. Symptoms may last for a few days or for as long as a week. Common symptoms include watery diarrhea and vomiting. Other symptoms include: Fever. Headache. Fatigue. Pain in the abdomen. Chills. Weakness. Nausea. Muscle aches. Loss of appetite. How  is this diagnosed? This condition is diagnosed with a medical history and physical exam. You may also have a stool test to check for viruses or other infections. How is this treated? This condition typically goes away on its own. The focus of treatment is to prevent dehydration and restore lost fluids (rehydration). This condition may be treated with: An oral rehydration solution (ORS) to replace  important salts and minerals (electrolytes) in your body. Take this if told by your health care provider. This is a drink that is sold at pharmacies and retail stores. Medicines to help with your symptoms. Probiotic supplements to reduce symptoms of diarrhea. Fluids given through an IV, if dehydration is severe. Older adults and people with other diseases or a weak immune system are at higher risk for dehydration. Follow these instructions at home: Eating and drinking  Take an ORS as told by your health care provider. Drink clear fluids in small amounts as you are able. Clear fluids include: Water. Ice chips. Diluted fruit juice. Low-calorie sports drinks. Drink enough fluid to keep your urine pale yellow. Eat small amounts of healthy foods every 3-4 hours as you are able. This may include whole grains, fruits, vegetables, lean meats, and yogurt. Avoid fluids that contain a lot of sugar or caffeine, such as energy drinks, sports drinks, and soda. Avoid spicy or fatty foods. Avoid alcohol. General instructions Wash your hands often, especially after having diarrhea or vomiting. If soap and water are not available, use hand sanitizer. Make sure that all people in your household wash their hands well and often. Take over-the-counter and prescription medicines only as told by your health care provider. Rest at home while you recover. Watch your condition for any changes. Take a warm bath to relieve any burning or pain from frequent diarrhea episodes. Keep all follow-up visits as told by your health care provider. This is important. Contact a health care provider if you: Cannot keep fluids down. Have symptoms that get worse. Have new symptoms. Feel light-headed or dizzy. Have muscle cramps. Get help right away if you: Have chest pain. Feel extremely weak or you faint. See blood in your vomit. Have vomit that looks like coffee grounds. Have bloody or black stools or stools that look  like tar. Have a severe headache, a stiff neck, or both. Have a rash. Have severe pain, cramping, or bloating in your abdomen. Have trouble breathing or you are breathing very quickly. Have a fast heartbeat. Have skin that feels cold and clammy. Feel confused. Have pain when you urinate. Have signs of dehydration, such as: Dark urine, very little urine, or no urine. Cracked lips. Dry mouth. Sunken eyes. Sleepiness. Weakness. Summary Viral gastroenteritis is also known as the stomach flu. It can cause sudden watery diarrhea, fever, and vomiting. This condition can be passed from person to person very easily (is contagious). Take an ORS if told by your health care provider. This is a drink that is sold at pharmacies and retail stores. Wash your hands often, especially after having diarrhea or vomiting. If soap and water are not available, use hand sanitizer. This information is not intended to replace advice given to you by your health care provider. Make sure you discuss any questions you have with your health care provider. Document Revised: 05/22/2019 Document Reviewed: 10/08/2018 Elsevier Patient Education  2022 [place].   Please schedule a Follow-up Appointment to: Return if symptoms worsen or fail to improve.  If you have any other questions or concerns,  please feel free to call the office or send a message through [place]. You may also schedule an earlier appointment if necessary.  Additionally, you may be receiving a survey about your experience at our office within a few days to 1 week by e-mail or mail. We value your feedback.  [name], DO [place]

## 2022-02-21 NOTE — Progress Notes (Signed)
? ?Subjective:  ? ? Patient ID: Amanda Webb, female    DOB: Mar 08, 1971, 51 y.o.   MRN: [id number] ? ?Amanda Webb is a 51 y.o. female presenting on 02/21/2022 for Diarrhea ? ? ?HPI ? ?ED FOLLOW-UP VISIT ? ?Hospital/Location: [facility] ED ?Date of ED Visit: 02/19/22 ? ?Reason for Presenting to ED: nausea vomiting diarrhea ? ?FOLLOW-UP  ?- ED provider note and record have been reviewed ?- Patient presents today about 2 days after recent ED visit. Brief summary of recent course, patient had symptoms of viral gastroenteritis, presented to ED on 02/19/22, testing in ED with viral GI pathogens, treated w zofran. ?- Today reports overall has done well after discharge from ED. Symptoms of fatigue and exhaustion dehydration have improved. Reduced nausea vomiting, now has some nausea, she has less diarrhea today. She has worked on re hydration. She has taken Zofran with some good results ? ? ?I have reviewed the discharge medication list, and have reconciled the current and discharge medications today. ? ? ?Depression screen [facility] 2/9 02/21/2022 12/19/2021 10/06/2021  ?Decreased Interest '2 2 1  '$ ?Down, Depressed, Hopeless '1 3 1  '$ ?PHQ - 2 Score '3 5 2  '$ ?Altered sleeping '1 3 1  '$ ?Tired, decreased energy '2 2 1  '$ ?Change in appetite 0 2 1  ?Feeling bad or failure about yourself  '1 3 1  '$ ?Trouble concentrating 0 1 1  ?Moving slowly or fidgety/restless 0 0 1  ?Suicidal thoughts 0 0 0  ?PHQ-9 Score '7 16 8  '$ ?Difficult doing work/chores Not difficult at all Extremely dIfficult Somewhat difficult  ?Some recent data might be hidden  ? ? ?Social History  ? ?Tobacco Use  ? Smoking status: Every Day  ?  Packs/day: 1.00  ?  Years: 32.00  ?  Pack years: 32.00  ?  Types: Cigarettes  ? Smokeless tobacco: Never  ? Tobacco comments:  ?  1 ppd/ 11/22/2020  ?Vaping Use  ? Vaping Use: Never used  ?Substance Use Topics  ? Alcohol use: Yes  ?  Comment: wine occ  ? Drug use: No  ? ? ?Review of Systems ?Per HPI unless specifically indicated above ? ?    ?Objective:  ?  ?BP 134/75   Pulse 79   Ht '5\' 7"'$  (1.702 m)   Wt 258 lb 3.2 oz (117.1 kg)   SpO2 97%   BMI 40.44 kg/m?   ?Wt Readings from Last 3 Encounters:  ?02/21/22 258 lb 3.2 oz (117.1 kg)  ?12/19/21 264 lb 3.2 oz (119.8 kg)  ?10/06/21 260 lb (117.9 kg)  ?  ?Physical Exam ?Vitals and nursing note reviewed.  ?Constitutional:   ?   General: She is not in acute distress. ?   Appearance: Normal appearance. She is well-developed. She is not diaphoretic.  ?   Comments: Well-appearing, comfortable, cooperative  ?HENT:  ?   Head: Normocephalic and atraumatic.  ?Eyes:  ?   General:     ?   Right eye: No discharge.     ?   Left eye: No discharge.  ?   Conjunctiva/sclera: Conjunctivae normal.  ?Cardiovascular:  ?   Rate and Rhythm: Normal rate.  ?Pulmonary:  ?   Effort: Pulmonary effort is normal.  ?Skin: ?   General: Skin is warm and dry.  ?   Findings: No erythema or rash.  ?Neurological:  ?   Mental Status: She is alert and oriented to person, place, and time.  ?Psychiatric:     ?  Mood and Affect: Mood normal.     ?   Behavior: Behavior normal.     ?   Thought Content: Thought content normal.  ?   Comments: Well groomed, good eye contact, normal speech and thoughts  ? ?Results for orders placed or performed in visit on 12/19/21  ?POCT glycosylated hemoglobin (Hb A1C)  ?Result Value Ref Range  ? Hemoglobin A1C 6.9 (A) 4.0 - 5.6 %  ? HbA1c POC (<> result, manual entry)    ? HbA1c, POC (prediabetic range)    ? HbA1c, POC (controlled diabetic range)    ? ?FECAL/STOOL PATHOGENS ?[place] ?02/20/2022 ?Component  ?  ?Giardia  ?Cryptosporidium  ?E. coli: O157  ?E. coli: Enterotoxigenic (ETEC)  ?E. coli: Shiga-toxin (STEC)  ?Salmonella  ?Shigella  ?Campylobacter  ?Norovirus  ?Rotavirus  ? ?Component 02/20/2022  ?   ?Giardia Negative  ?Cryptosporidium Negative  ?E. coli: O157 Negative  ?E. coli: Enterotoxigenic (ETEC) Negative  ?E. coli: Shiga-toxin (STEC) Negative  ?Salmonella Negative  ?Shigella Negative   ?Campylobacter Negative  ?Norovirus Positive, GII Abnormal    ?  ?Rotavirus Negative  ? ? ? ?   ?Assessment & Plan:  ? ?Problem List Items Addressed This Visit   ?None ?Visit Diagnoses   ? ? Gastroenteritis due to norovirus    -  Primary  ? Hypokalemia      ? Relevant Medications  ? potassium chloride SA (KLOR-CON M) 20 MEQ tablet  ? ?  ?  ?Acute viral Gastroenteritis, due to norovirus ?Recent ED visit testing for stool, has later identified norovirus ?Improved hydration now ?Some reduced GI losses ?Hypokalemia 3.3 in ED ?Oral rehydration plans with liquid and advancing diet ?Order Potassium 81mq daily x 7 days replace ?Consider Ensure/Boost as option for supplement if can tolerate ?LIkely self limited precautions given ?Use Zofran PRn and Imodium PRN if need ? ? ?Meds ordered this encounter  ?Medications  ? potassium chloride SA (KLOR-CON M) 20 MEQ tablet  ?  Sig: Take 1 tablet (20 mEq total) by mouth daily.  ?  Dispense:  7 tablet  ?  Refill:  0  ? ? ? ? ?Follow up plan: ?Return if symptoms worsen or fail to improve. ? ?ANobie Putnam DO ?[facility] ?02/21/2022, 9:49 AM ?

## 2022-02-22 ENCOUNTER — Other Ambulatory Visit: Payer: Self-pay | Admitting: Family Medicine

## 2022-02-22 DIAGNOSIS — E1169 Type 2 diabetes mellitus with other specified complication: Secondary | ICD-10-CM

## 2022-02-22 MED ORDER — OZEMPIC (0.25 OR 0.5 MG/DOSE) 2 MG/1.5ML ~~LOC~~ SOPN: 0.2500 mg | PEN_INJECTOR | SUBCUTANEOUS | 1 refills | Status: DC

## 2022-03-06 ENCOUNTER — Other Ambulatory Visit: Payer: Self-pay

## 2022-03-06 ENCOUNTER — Encounter: Payer: Self-pay | Admitting: Family Medicine

## 2022-03-06 ENCOUNTER — Other Ambulatory Visit: Payer: Self-pay | Admitting: Pulmonary Disease

## 2022-03-06 DIAGNOSIS — J410 Simple chronic bronchitis: Secondary | ICD-10-CM

## 2022-03-06 MED ORDER — TRELEGY ELLIPTA 100-62.5-25 MCG/ACT IN AEPB: 1.0000 | INHALATION_SPRAY | Freq: Every day | RESPIRATORY_TRACT | 11 refills | Status: DC

## 2022-03-07 ENCOUNTER — Inpatient Hospital Stay: Payer: Medicare Other

## 2022-03-12 ENCOUNTER — Inpatient Hospital Stay: Payer: Medicare Other | Attending: Oncology

## 2022-03-12 ENCOUNTER — Other Ambulatory Visit: Payer: Self-pay

## 2022-03-12 ENCOUNTER — Inpatient Hospital Stay: Payer: Medicare Other

## 2022-03-12 DIAGNOSIS — E538 Deficiency of other specified B group vitamins: Secondary | ICD-10-CM | POA: Diagnosis not present

## 2022-03-12 DIAGNOSIS — D509 Iron deficiency anemia, unspecified: Secondary | ICD-10-CM

## 2022-03-12 MED ORDER — CYANOCOBALAMIN 1000 MCG/ML IJ SOLN
1000.0000 ug | Freq: Once | INTRAMUSCULAR | Status: AC
  Administered 2022-03-12: 1000 ug via INTRAMUSCULAR
  Filled 2022-03-12: qty 1

## 2022-03-19 ENCOUNTER — Encounter: Payer: Self-pay | Admitting: Family Medicine

## 2022-03-19 DIAGNOSIS — Z8616 Personal history of COVID-19: Secondary | ICD-10-CM

## 2022-03-19 DIAGNOSIS — J410 Simple chronic bronchitis: Secondary | ICD-10-CM

## 2022-03-20 ENCOUNTER — Encounter: Payer: Self-pay | Admitting: Family Medicine

## 2022-03-20 ENCOUNTER — Ambulatory Visit
Admission: RE | Admit: 2022-03-20 | Discharge: 2022-03-20 | Disposition: A | Discharge status: Home / Self Care | Payer: Medicare Other | Attending: Family Medicine | Admitting: Family Medicine

## 2022-03-20 ENCOUNTER — Ambulatory Visit
Admission: RE | Admit: 2022-03-20 | Discharge: 2022-03-20 | Disposition: A | Discharge status: Home / Self Care | Payer: Medicare Other | Source: Ambulatory Visit | Attending: Family Medicine | Admitting: Family Medicine

## 2022-03-20 ENCOUNTER — Ambulatory Visit: Payer: Medicare Other

## 2022-03-20 DIAGNOSIS — Z8616 Personal history of COVID-19: Secondary | ICD-10-CM

## 2022-03-20 DIAGNOSIS — J410 Simple chronic bronchitis: Secondary | ICD-10-CM

## 2022-03-20 DIAGNOSIS — R918 Other nonspecific abnormal finding of lung field: Secondary | ICD-10-CM | POA: Diagnosis not present

## 2022-03-20 DIAGNOSIS — R059 Cough, unspecified: Secondary | ICD-10-CM | POA: Diagnosis not present

## 2022-03-22 ENCOUNTER — Ambulatory Visit: Payer: Medicare Other

## 2022-04-11 ENCOUNTER — Encounter: Payer: Self-pay | Admitting: Obstetrics and Gynecology

## 2022-04-11 ENCOUNTER — Inpatient Hospital Stay: Payer: Medicare Other

## 2022-04-11 ENCOUNTER — Other Ambulatory Visit: Payer: Self-pay | Admitting: Obstetrics and Gynecology

## 2022-04-11 ENCOUNTER — Other Ambulatory Visit: Payer: Self-pay

## 2022-04-11 DIAGNOSIS — Z3042 Encounter for surveillance of injectable contraceptive: Secondary | ICD-10-CM

## 2022-04-12 ENCOUNTER — Inpatient Hospital Stay: Payer: Medicare Other

## 2022-04-13 ENCOUNTER — Inpatient Hospital Stay: Payer: Medicare Other | Attending: Oncology

## 2022-04-13 ENCOUNTER — Other Ambulatory Visit: Payer: Self-pay | Admitting: Family Medicine

## 2022-04-13 DIAGNOSIS — E538 Deficiency of other specified B group vitamins: Secondary | ICD-10-CM | POA: Insufficient documentation

## 2022-04-13 DIAGNOSIS — D509 Iron deficiency anemia, unspecified: Secondary | ICD-10-CM

## 2022-04-13 DIAGNOSIS — I10 Essential (primary) hypertension: Secondary | ICD-10-CM

## 2022-04-13 MED ORDER — CYANOCOBALAMIN 1000 MCG/ML IJ SOLN
1000.0000 ug | Freq: Once | INTRAMUSCULAR | Status: AC
  Administered 2022-04-13: 1000 ug via INTRAMUSCULAR
  Filled 2022-04-13: qty 1

## 2022-04-13 NOTE — Telephone Encounter (Signed)
Requested medication (s) are due for refill today - yes ? ?Requested medication (s) are on the active medication list -yes ? ?Future visit scheduled -yes ? ?Last refill: 10/26/21 #90 1RF ? ?Notes to clinic: Request RF: fails lab protocol-over 1 year ? ?Requested Prescriptions  ?Pending Prescriptions Disp Refills  ? atenolol (TENORMIN) 50 MG tablet [Pharmacy Med Name: ATENOLOL '50MG'$  TABLETS] 90 tablet 1  ?  Sig: TAKE 1 TABLET(50 MG) BY MOUTH DAILY  ?  ? Cardiovascular: Beta Blockers 2 Failed - 04/13/2022 11:21 AM  ?  ?  Failed - Cr in normal range and within 360 days  ?  Creat  ?Date Value Ref Range Status  ?11/08/2020 0.64 0.50 - 1.10 mg/dL Final  ?  ?  ?  ?  Passed - Last BP in normal range  ?  BP Readings from Last 1 Encounters:  ?02/21/22 134/75  ?  ?  ?  ?  Passed - Last Heart Rate in normal range  ?  Pulse Readings from Last 1 Encounters:  ?02/21/22 79  ?  ?  ?  ?  Passed - Valid encounter within last 6 months  ?  Recent Outpatient Visits   ? ?      ? 1 month ago Gastroenteritis due to norovirus  ? [place], DO  ? 3 months ago Morbid obesity with BMI of 40.0-44.9, adult [facility])  ? [place], DO  ? 6 months ago Trigeminal neuralgia  ? [place], DO  ? 6 months ago Needs flu shot  ? [place], DO  ? 8 months ago Right facial swelling  ? [place], DO  ? ?  ?  ? ? ?  ?  ?  ? ? ? ?Requested Prescriptions  ?Pending Prescriptions Disp Refills  ? atenolol (TENORMIN) 50 MG tablet [Pharmacy Med Name: ATENOLOL '50MG'$  TABLETS] 90 tablet 1  ?  Sig: TAKE 1 TABLET(50 MG) BY MOUTH DAILY  ?  ? Cardiovascular: Beta Blockers 2 Failed - 04/13/2022 11:21 AM  ?  ?  Failed - Cr in normal range and within 360 days  ?  Creat  ?Date Value Ref Range Status  ?11/08/2020 0.64 0.50 - 1.10 mg/dL Final  ?  ?  ?  ?  Passed - Last BP in  normal range  ?  BP Readings from Last 1 Encounters:  ?02/21/22 134/75  ?  ?  ?  ?  Passed - Last Heart Rate in normal range  ?  Pulse Readings from Last 1 Encounters:  ?02/21/22 79  ?  ?  ?  ?  Passed - Valid encounter within last 6 months  ?  Recent Outpatient Visits   ? ?      ? 1 month ago Gastroenteritis due to norovirus  ? [place], DO  ? 3 months ago Morbid obesity with BMI of 40.0-44.9, [facility]  ? [place], DO  ? 6 months ago Trigeminal neuralgia  ? [place], DO  ? 6 months ago Needs flu shot  ? [place], DO  ? 8 months ago Right facial swelling  ? [place], DO  ? ?  ?  ? ? ?  ?  ?  ? ? ? ?

## 2022-04-16 ENCOUNTER — Ambulatory Visit (INDEPENDENT_AMBULATORY_CARE_PROVIDER_SITE_OTHER): Payer: Medicare Other | Admitting: Obstetrics and Gynecology

## 2022-04-16 DIAGNOSIS — Z3042 Encounter for surveillance of injectable contraceptive: Secondary | ICD-10-CM | POA: Diagnosis not present

## 2022-04-16 NOTE — Progress Notes (Signed)
Date last pap: 09/27/2020. ?Last Depo-Provera: 11/13/2021 ?Side Effects if any: None. ?Serum HCG indicated? N/A. ?Depo-Provera 150 mg IM given by: Cristy Folks, CMA ?Next appointment due: July 17 -  July 31 ?

## 2022-04-16 NOTE — Patient Instructions (Signed)
Medroxyprogesterone Injection (Contraception) ?What is this medication? ?MEDROXYPROGESTERONE (me DROX ee proe JES te rone) prevents ovulation and pregnancy. It belongs to a group of medications called contraceptives. This medication is a progestin hormone. ?This medicine may be used for other purposes; ask your health care provider or pharmacist if you have questions. ?COMMON BRAND NAME(S): Depo-Provera, Depo-subQ Provera 104 ?What should I tell my care team before I take this medication? ?They need to know if you have any of these conditions: ?Asthma ?Blood clots ?Breast cancer or family history of breast cancer ?Depression ?Diabetes ?Eating disorder (anorexia nervosa) ?Heart attack ?High blood pressure ?HIV infection or AIDS ?If you often drink alcohol ?Kidney disease ?Liver disease ?Migraine headaches ?Osteoporosis, weak bones ?Seizures ?Stroke ?Tobacco smoker ?Vaginal bleeding ?An unusual or allergic reaction to medroxyprogesterone, other hormones, medications, foods, dyes, or preservatives ?Pregnant or trying to get pregnant ?Breast-feeding ?How should I use this medication? ?Depo-Provera CI contraceptive injection is given into a muscle. Depo-subQ Provera 104 injection is given under the skin. It is given in a hospital or clinic setting. The injection is usually given during the first 5 days after the start of a menstrual period or 6 weeks after delivery of a baby. ?A patient package insert for the product will be given with each prescription and refill. Be sure to read this information carefully each time. The sheet may change often. ?Talk to your care team about the use of this medication in children. Special care may be needed. These injections have been used in female children who have started having menstrual periods. ?Overdosage: If you think you have taken too much of this medicine contact a poison control center or emergency room at once. ?NOTE: This medicine is only for you. Do not share this medicine  with others. ?What if I miss a dose? ?Keep appointments for follow-up doses. You must get an injection once every 3 months. It is important not to miss your dose. Call your care team if you are unable to keep an appointment. ?What may interact with this medication? ?Antibiotics or medications for infections, especially rifampin and griseofulvin ?Antivirals for HIV or hepatitis ?Aprepitant ?Armodafinil ?Bexarotene ?Bosentan ?Medications for seizures like carbamazepine, felbamate, oxcarbazepine, phenytoin, phenobarbital, primidone, topiramate ?Mitotane ?Modafinil ?St. John's wort ?This list may not describe all possible interactions. Give your health care provider a list of all the medicines, herbs, non-prescription drugs, or dietary supplements you use. Also tell them if you smoke, drink alcohol, or use illegal drugs. Some items may interact with your medicine. ?What should I watch for while using this medication? ?This medication does not protect you against HIV infection (AIDS) or other sexually transmitted diseases. ?Use of this product may cause you to lose calcium from your bones. Loss of calcium may cause weak bones (osteoporosis). Only use this product for more than 2 years if other forms of birth control are not right for you. The longer you use this product for birth control the more likely you will be at risk for weak bones. Ask your care team how you can keep strong bones. ?You may have a change in bleeding pattern or irregular periods. Many females stop having periods while taking this medication. ?If you have received your injections on time, your chance of being pregnant is very low. If you think you may be pregnant, see your care team as soon as possible. ?Tell your care team if you want to get pregnant within the next year. The effect of this medication may last a   long time after you get your last injection. ?What side effects may I notice from receiving this medication? ?Side effects that you should  report to your care team as soon as possible: ?Allergic reactions--skin rash, itching, hives, swelling of the face, lips, tongue, or throat ?Blood clot--pain, swelling, or warmth in the leg, shortness of breath, chest pain ?Gallbladder problems--severe stomach pain, nausea, vomiting, fever ?Increase in blood pressure ?Liver injury--right upper belly pain, loss of appetite, nausea, light-colored stool, dark yellow or brown urine, yellowing skin or eyes, unusual weakness or fatigue ?New or worsening migraines or headaches ?Seizures ?Stroke--sudden numbness or weakness of the face, arm, or leg, trouble speaking, confusion, trouble walking, loss of balance or coordination, dizziness, severe headache, change in vision ?Unusual vaginal discharge, itching, or odor ?Worsening mood, feelings of depression ?Side effects that usually do not require medical attention (report to your care team if they continue or are bothersome): ?Breast pain or tenderness ?Dark patches of the skin on the face or other sun-exposed areas ?Irregular menstrual cycles or spotting ?Nausea ?Weight gain ?This list may not describe all possible side effects. Call your doctor for medical advice about side effects. You may report side effects to FDA at 1-800-FDA-1088. ?Where should I keep my medication? ?This injection is only given by a care team. It will not be stored at home. ?NOTE: This sheet is a summary. It may not cover all possible information. If you have questions about this medicine, talk to your doctor, pharmacist, or health care provider. ?? 2023 Elsevier/Gold Standard (2021-02-05 00:00:00) ? ?

## 2022-04-17 ENCOUNTER — Encounter: Payer: Self-pay | Admitting: Urgent Care

## 2022-04-27 ENCOUNTER — Telehealth: Payer: Self-pay | Admitting: Obstetrics and Gynecology

## 2022-04-27 ENCOUNTER — Other Ambulatory Visit: Payer: Self-pay

## 2022-04-27 DIAGNOSIS — E1169 Type 2 diabetes mellitus with other specified complication: Secondary | ICD-10-CM

## 2022-04-27 MED ORDER — OZEMPIC (0.25 OR 0.5 MG/DOSE) 2 MG/1.5ML ~~LOC~~ SOPN: 0.5000 mg | PEN_INJECTOR | SUBCUTANEOUS | 1 refills | Status: DC

## 2022-04-27 NOTE — Telephone Encounter (Signed)
Patient called and stated that she has been receiving a bill from [facility] for an injection for depo and has never been billed before so she thinks that it has been coded wrong. Patient was seen by Dr. Amalia Hailey on 11-13-2021 and was billed for $129.00. Patient would like this resolved. Patient is asking that this encounter be coded correctly and resent to insurance for correcting. Patient would like a call back in regards to this issue for further explanation or when its resolved. Please advise. ?

## 2022-05-03 ENCOUNTER — Encounter: Payer: Self-pay | Admitting: Family Medicine

## 2022-05-09 ENCOUNTER — Inpatient Hospital Stay: Payer: Medicare Other

## 2022-05-10 DIAGNOSIS — F4310 Post-traumatic stress disorder, unspecified: Secondary | ICD-10-CM | POA: Diagnosis not present

## 2022-05-11 ENCOUNTER — Inpatient Hospital Stay: Payer: Medicare Other | Attending: Oncology

## 2022-05-11 DIAGNOSIS — D509 Iron deficiency anemia, unspecified: Secondary | ICD-10-CM

## 2022-05-11 DIAGNOSIS — E538 Deficiency of other specified B group vitamins: Secondary | ICD-10-CM | POA: Diagnosis not present

## 2022-05-11 MED ORDER — CYANOCOBALAMIN 1000 MCG/ML IJ SOLN
1000.0000 ug | Freq: Once | INTRAMUSCULAR | Status: AC
  Administered 2022-05-11: 1000 ug via INTRAMUSCULAR
  Filled 2022-05-11: qty 1

## 2022-05-18 ENCOUNTER — Other Ambulatory Visit: Payer: Self-pay

## 2022-05-18 ENCOUNTER — Encounter: Payer: Self-pay | Admitting: Family Medicine

## 2022-05-18 DIAGNOSIS — B372 Candidiasis of skin and nail: Secondary | ICD-10-CM

## 2022-05-18 DIAGNOSIS — B379 Candidiasis, unspecified: Secondary | ICD-10-CM

## 2022-05-18 MED ORDER — NYSTATIN-TRIAMCINOLONE 100000-0.1 UNIT/GM-% EX CREA: 1.0000 "application " | TOPICAL_CREAM | Freq: Two times a day (BID) | CUTANEOUS | 0 refills | Status: DC

## 2022-05-21 ENCOUNTER — Encounter: Payer: Self-pay | Admitting: Family Medicine

## 2022-05-21 MED ORDER — NYSTATIN 100000 UNIT/GM EX CREA: 1.0000 "application " | TOPICAL_CREAM | Freq: Two times a day (BID) | CUTANEOUS | 5 refills | Status: DC | PRN

## 2022-05-24 ENCOUNTER — Telehealth: Payer: Self-pay | Admitting: Obstetrics and Gynecology

## 2022-05-24 NOTE — Telephone Encounter (Signed)
I spoke to the patient to let her know that her 5/12 message came in while I was out of the office unexpectedly, and that I had message the coding department for review last week. Patient understood. I will contact the patient when I hear back from the coding department. Patient also asked about a charge from [facility] 06/12/21, and let her know that the charge was refiled in 12/22, and that she wasn't showing a balance from that date of service.

## 2022-05-24 NOTE — Telephone Encounter (Signed)
Pt is asking for a return call  from Amanda Webb. Pt states she has called multiple times in regards to an incorrect billing code regarding insurance.  Amanda Webb states she originally called May 12,2023 and has yet to receive a return call. Pt states she has called multiple times since with no resolution.

## 2022-06-05 ENCOUNTER — Telehealth: Payer: Self-pay | Admitting: Obstetrics and Gynecology

## 2022-06-05 ENCOUNTER — Other Ambulatory Visit: Payer: Self-pay | Admitting: Family Medicine

## 2022-06-05 DIAGNOSIS — J309 Allergic rhinitis, unspecified: Secondary | ICD-10-CM

## 2022-06-05 DIAGNOSIS — K219 Gastro-esophageal reflux disease without esophagitis: Secondary | ICD-10-CM

## 2022-06-05 NOTE — Telephone Encounter (Signed)
Pt called requesting to speak with practice admin about an injection bill from 11/13/2021. She is asking to speak with nancy.

## 2022-06-05 NOTE — Telephone Encounter (Signed)
Requested Prescriptions  Pending Prescriptions Disp Refills  . pantoprazole (PROTONIX) 20 MG tablet [Pharmacy Med Name: PANTOPRAZOLE '20MG'$  TABLETS] 90 tablet 1    Sig: TAKE 1 TABLET(20 MG) BY MOUTH DAILY     Gastroenterology: Proton Pump Inhibitors Passed - 06/05/2022  6:40 AM      Passed - Valid encounter within last 12 months    Recent Outpatient Visits          3 months ago Gastroenteritis due to norovirus   [place], DO   5 months ago Morbid obesity with BMI of 40.0-44.9, adult [facility])   [facility] Olin Hauser, DO   8 months ago Trigeminal neuralgia   [place], DO   8 months ago Needs flu shot   [place], DO   10 months ago Right facial swelling   [place], DO             . fluticasone [facility]) 50 MCG/ACT nasal spray [Pharmacy Med Name: FLUTICASONE 50MCG NASAL SP (120) RX] 48 g 1    Sig: SHAKE LIQUID AND USE 2 SPRAYS IN EACH NOSTRIL EVERY DAY     Ear, Nose, and Throat: Nasal Preparations - Corticosteroids Passed - 06/05/2022  6:40 AM      Passed - Valid encounter within last 12 months    Recent Outpatient Visits          3 months ago Gastroenteritis due to norovirus   [place], DO   5 months ago Morbid obesity with BMI of 40.0-44.9, adult [facility])   [facility] Olin Hauser, DO   8 months ago Trigeminal neuralgia   [place], DO   8 months ago Needs flu shot   [place], DO   10 months ago Right facial swelling   [place], Devonne Doughty, [place]

## 2022-06-06 DIAGNOSIS — H66001 Acute suppurative otitis media without spontaneous rupture of ear drum, right ear: Secondary | ICD-10-CM | POA: Diagnosis not present

## 2022-06-10 ENCOUNTER — Encounter: Payer: Self-pay | Admitting: Family Medicine

## 2022-06-11 ENCOUNTER — Ambulatory Visit: Payer: Medicare Other | Admitting: Family Medicine

## 2022-06-13 ENCOUNTER — Inpatient Hospital Stay: Payer: Medicare Other | Attending: Oncology

## 2022-06-13 ENCOUNTER — Other Ambulatory Visit: Payer: Medicare Other

## 2022-06-13 ENCOUNTER — Encounter: Payer: Self-pay | Admitting: Oncology

## 2022-06-13 ENCOUNTER — Inpatient Hospital Stay: Payer: Medicare Other

## 2022-06-13 ENCOUNTER — Other Ambulatory Visit: Payer: Self-pay

## 2022-06-13 ENCOUNTER — Inpatient Hospital Stay (HOSPITAL_BASED_OUTPATIENT_CLINIC_OR_DEPARTMENT_OTHER): Payer: Medicare Other | Admitting: Oncology

## 2022-06-13 ENCOUNTER — Ambulatory Visit: Payer: Medicare Other | Admitting: Oncology

## 2022-06-13 VITALS — BP 134/72 | HR 80 | Temp 98.9°F | Resp 16 | Wt 259.9 lb

## 2022-06-13 DIAGNOSIS — E538 Deficiency of other specified B group vitamins: Secondary | ICD-10-CM | POA: Insufficient documentation

## 2022-06-13 DIAGNOSIS — D500 Iron deficiency anemia secondary to blood loss (chronic): Secondary | ICD-10-CM | POA: Insufficient documentation

## 2022-06-13 DIAGNOSIS — D509 Iron deficiency anemia, unspecified: Secondary | ICD-10-CM

## 2022-06-13 DIAGNOSIS — N920 Excessive and frequent menstruation with regular cycle: Secondary | ICD-10-CM | POA: Insufficient documentation

## 2022-06-13 LAB — CBC WITH DIFFERENTIAL/PLATELET
Abs Immature Granulocytes: 0.05 10*3/uL (ref 0.00–0.07)
Basophils Absolute: 0.1 10*3/uL (ref 0.0–0.1)
Basophils Relative: 1 %
Eosinophils Absolute: 0.2 10*3/uL (ref 0.0–0.5)
Eosinophils Relative: 2 %
HCT: 43.3 % (ref 36.0–46.0)
Hemoglobin: 14.8 g/dL (ref 12.0–15.0)
Immature Granulocytes: 1 %
Lymphocytes Relative: 33 %
Lymphs Abs: 3.4 10*3/uL (ref 0.7–4.0)
MCH: 32.1 pg (ref 26.0–34.0)
MCHC: 34.2 g/dL (ref 30.0–36.0)
MCV: 93.9 fL (ref 80.0–100.0)
Monocytes Absolute: 0.6 10*3/uL (ref 0.1–1.0)
Monocytes Relative: 6 %
Neutro Abs: 6 10*3/uL (ref 1.7–7.7)
Neutrophils Relative %: 57 %
Platelets: 247 10*3/uL (ref 150–400)
RBC: 4.61 MIL/uL (ref 3.87–5.11)
RDW: 12.5 % (ref 11.5–15.5)
WBC: 10.3 10*3/uL (ref 4.0–10.5)
nRBC: 0 % (ref 0.0–0.2)

## 2022-06-13 LAB — FERRITIN: Ferritin: 80 ng/mL (ref 11–307)

## 2022-06-13 LAB — IRON AND TIBC
Iron: 89 ug/dL (ref 28–170)
Saturation Ratios: 24 % (ref 10.4–31.8)
TIBC: 379 ug/dL (ref 250–450)
UIBC: 290 ug/dL

## 2022-06-13 LAB — VITAMIN B12: Vitamin B-12: 628 pg/mL (ref 180–914)

## 2022-06-13 MED ORDER — CYANOCOBALAMIN 1000 MCG/ML IJ SOLN
1000.0000 ug | Freq: Once | INTRAMUSCULAR | Status: AC
  Administered 2022-06-13: 1000 ug via INTRAMUSCULAR
  Filled 2022-06-13: qty 1

## 2022-06-13 NOTE — Progress Notes (Signed)
Pt in for follow up, reports has right inner ear infection and currently on antibiotics.

## 2022-06-16 ENCOUNTER — Encounter: Payer: Self-pay | Admitting: Urgent Care

## 2022-06-16 NOTE — Progress Notes (Signed)
Hematology/Oncology Consult note [facility]  Telephone:([phone] [phone]  Patient Care Team: Olin Hauser, DO as PCP - General (Family Medicine) Minna Merritts, MD as Consulting Physician (Cardiology) Sindy Guadeloupe, MD as Consulting Physician (Hematology and Oncology)   Name of the patient: Amanda Webb  [id number]  12-12-1971   Date of visit: 06/16/22  Diagnosis-history of iron and B12 deficiency anemia  Chief complaint/ Reason for visit-routine follow-up of anemia  Heme/Onc history: Patient is a 51 year old female with history of iron and B12 deficiency anemia which has been attribute it to menorrhagia.  She has positive intrinsic factor antibody and she is on B12 injections.    Interval history-patient is presently doing well.  Denies any blood loss in the stool or urine.  Denies any dark melanotic stools  ECOG PS- 0 Pain scale- 0   Review of systems- Review of Systems  Constitutional:  Negative for chills, fever, malaise/fatigue and weight loss.  HENT:  Negative for congestion, ear discharge and nosebleeds.   Eyes:  Negative for blurred vision.  Respiratory:  Negative for cough, hemoptysis, sputum production, shortness of breath and wheezing.   Cardiovascular:  Negative for chest pain, palpitations, orthopnea and claudication.  Gastrointestinal:  Negative for abdominal pain, blood in stool, constipation, diarrhea, heartburn, melena, nausea and vomiting.  Genitourinary:  Negative for dysuria, flank pain, frequency, hematuria and urgency.  Musculoskeletal:  Negative for back pain, joint pain and myalgias.  Skin:  Negative for rash.  Neurological:  Negative for dizziness, tingling, focal weakness, seizures, weakness and headaches.  Endo/Heme/Allergies:  Does not bruise/bleed easily.  Psychiatric/Behavioral:  Negative for depression and suicidal ideas. The patient does not have insomnia.       Allergies  Allergen  Reactions   Bee Venom Anaphylaxis, Hives and Swelling    Carries Epi pen.    Cat Hair Extract Itching, Other (See Comments) and Swelling    Allergic to trees, nuts, wheat, grass, cats & dogs - itchy watery eyes, swelling. Uses Zyrtec & Flonase & Benadryl if really bad. Used to get allergy shots. Allergic to trees, nuts, wheat, grass, cats & dogs - itchy watery eyes, swelling. Uses Zyrtec & Flonase & Benadryl if really bad. Used to get allergy shots.   Dog Epithelium Cough and Shortness Of Breath   Dog Epithelium Allergy Skin Test Shortness Of Breath   Dog Fennel Cough and Shortness Of Breath   Dog Fennel Allergy Skin Test Shortness Of Breath   Dust Mite Extract Cough and Shortness Of Breath   Tetracyclines & Related Nausea And Vomiting   Lac Bovis Diarrhea and Nausea And Vomiting   Lactose Diarrhea and Nausea And Vomiting   Milk Protein Diarrhea and Nausea And Vomiting   Tape Other (See Comments) and Rash    Needs to use paper tape. Breaks out with severe rash, pulls skin off when using adhesive.     Past Medical History:  Diagnosis Date   Allergy    Anemia    Anxiety    Arrhythmia    Arthritis    Cervical dysplasia    Cystitis    Depression    Dyspnea    GERD (gastroesophageal reflux disease)    Gross hematuria    Headache    Heart murmur    asymptomatic   History of methicillin resistant staphylococcus aureus (MRSA) 2013   HLD (hyperlipidemia)    Hypertension    Hypothyroid    Lymphedema    Palpitations  Pernicious anemia    Tobacco abuse      Past Surgical History:  Procedure Laterality Date   CARPAL TUNNEL RELEASE Bilateral 2011   FOOT SURGERY Right 2013   Plantar fascia   KNEE ARTHROSCOPY WITH MEDIAL MENISECTOMY Right 07/11/2021   Procedure: Right knee arthroscopic partial medial meniscectomy;  Surgeon: Leim Fabry, MD;  Location: [place];  Service: Orthopedics;  Laterality: Right;   ROBOTIC ASSISTED LAPAROSCOPIC CHOLECYSTECTOMY  07/15/2020    Dr Christian Mate   TONSILLECTOMY  1979   with ear tubes    Social History   Socioeconomic History   Marital status: Divorced    Spouse name: Not on file   Number of children: Not on file   Years of education: Some college   Highest education level: Some college, no degree  Occupational History   Occupation: disability   Tobacco Use   Smoking status: Every Day    Packs/day: 1.00    Years: 32.00    Total pack years: 32.00    Types: Cigarettes   Smokeless tobacco: Never   Tobacco comments:    1 ppd/ 11/22/2020  Vaping Use   Vaping Use: Never used  Substance and Sexual Activity   Alcohol use: Yes    Comment: wine occ   Drug use: No   Sexual activity: Yes    Birth control/protection: Condom, Injection  Other Topics Concern   Not on file  Social History Narrative   Lives with mom   Social Determinants of Health   Financial Resource Strain: Low Risk  (03/14/2021)   Overall Financial Resource Strain (CARDIA)    Difficulty of Paying Living Expenses: Not hard at all  Food Insecurity: No Food Insecurity (03/14/2021)   Hunger Vital Sign    Worried About Running Out of Food in the Last Year: Never true    [place] in the Last Year: Never true  Transportation Needs: No Transportation Needs (03/14/2021)   PRAPARE - Hydrologist (Medical): No    Lack of Transportation (Non-Medical): No  Physical Activity: Inactive (03/14/2021)   Exercise Vital Sign    Days of Exercise per Week: 0 days    Minutes of Exercise per Session: 0 min  Stress: Stress Concern Present (03/14/2021)   [place]    Feeling of Stress : Very much  Social Connections: Socially Isolated (07/07/2019)   Social Connection and Isolation Panel [NHANES]    Frequency of Communication with Friends and Family: Three times a week    Frequency of Social Gatherings with Friends and Family: Three times a week    Attends  Religious Services: Never    Active Member of Clubs or Organizations: No    Attends Archivist Meetings: Never    Marital Status: Divorced  Human resources officer Violence: Not At Risk (07/07/2019)   Humiliation, Afraid, Rape, and Kick questionnaire    Fear of Current or Ex-Partner: No    Emotionally Abused: No    Physically Abused: No    Sexually Abused: No    Family History  Problem Relation Age of Onset   Depression Mother    Mental illness Mother    Alcohol abuse Mother    Lung cancer Mother    Cancer Maternal Grandmother    Diabetes Maternal Grandmother    Ovarian cancer Maternal Grandmother    Kidney disease Maternal Grandfather    Diabetes Father    Mental illness Father  Depression Father    Drug abuse Father    Depression Paternal Grandfather    Drug abuse Paternal Grandfather    Bladder Cancer Neg Hx    Breast cancer Neg Hx      Current Outpatient Medications:    Accu-Chek FastClix Lancets MISC, USE TO CHECK SUGAR UP TO TWICE DAILY AS DIRECTED, Disp: 102 each, Rfl: 5   albuterol (VENTOLIN HFA) 108 (90 Base) MCG/ACT inhaler, INHALE 1 TO 2 PUFFS BY MOUTH EVERY 4 TO 6 HOURS AS NEEDED FOR SHORTNESS OF BREATH, Disp: 18 g, Rfl: 2   amoxicillin (AMOXIL) 875 MG tablet, Take 875 mg by mouth 2 (two) times daily., Disp: , Rfl:    ARIPiprazole (ABILIFY) 2 MG tablet, Take 2 mg by mouth every morning. , Disp: , Rfl:    atenolol (TENORMIN) 50 MG tablet, TAKE 1 TABLET(50 MG) BY MOUTH DAILY, Disp: 90 tablet, Rfl: 1   azelastine (ASTELIN) 0.1 % nasal spray, INSTILL 1 SPRAY INTO BOTH NOSTRILS TWICE DAILY AS DIRECTED, Disp: 30 mL, Rfl: 5   Cetirizine HCl 10 MG CAPS, Take 10 mg by mouth daily with lunch. , Disp: , Rfl:    Cyanocobalamin (B-12 COMPLIANCE INJECTION IJ), Inject 1 Dose as directed every 30 (thirty) days., Disp: , Rfl:    fluticasone (FLONASE) 50 MCG/ACT nasal spray, SHAKE LIQUID AND USE 2 SPRAYS IN EACH NOSTRIL EVERY DAY, Disp: 48 g, Rfl: 1   fluvoxaMINE (LUVOX) 100  MG tablet, Take 150 mg by mouth every morning. , Disp: , Rfl:    gabapentin (NEURONTIN) 300 MG capsule, Take 1 capsule (300 mg total) by mouth 3 (three) times daily., Disp: 90 capsule, Rfl: 3   glucose blood (ACCU-CHEK GUIDE) test strip, CHECK BLOOD SUGAR UP TO EVERY DAY AS DIRECTED, Disp: 200 strip, Rfl: 5   ketoconazole (NIZORAL) 2 % shampoo, APPLY EXTERNALLY 2 TIMES A WEEK, Disp: 120 mL, Rfl: 2   lamoTRIgine (LAMICTAL) 200 MG tablet, Take 200 mg by mouth every morning. , Disp: , Rfl:    levothyroxine (SYNTHROID) 50 MCG tablet, TAKE 1 TABLET(50 MCG) BY MOUTH DAILY BEFORE AND BREAKFAST, Disp: 90 tablet, Rfl: 1   medroxyPROGESTERone (DEPO-PROVERA) 150 MG/ML injection, BRING DEPO INJECTION TO THE OFFICE EACH VISIT, Disp: 1 mL, Rfl: 2   Multiple Vitamin (MULTIVITAMIN WITH MINERALS) TABS tablet, Take 1 tablet by mouth daily., Disp: , Rfl:    nystatin cream (MYCOSTATIN), Apply 1 application. topically 2 (two) times daily as needed (rash)., Disp: 30 g, Rfl: 5   OZEMPIC, 0.25 OR 0.5 MG/DOSE, 2 MG/1.5ML SOPN, Inject 0.5 mg into the skin once a week., Disp: 4.5 mL, Rfl: 1   pantoprazole (PROTONIX) 20 MG tablet, TAKE 1 TABLET(20 MG) BY MOUTH DAILY, Disp: 90 tablet, Rfl: 1   TRELEGY ELLIPTA 100-62.5-25 MCG/ACT AEPB, Inhale 1 puff into the lungs daily., Disp: 1 each, Rfl: 11   oxybutynin (DITROPAN-XL) 5 MG 24 hr tablet, TAKE 1 TABLET BY MOUTH EVERY DAY (Patient not taking: Reported on 06/13/2022), Disp: 90 tablet, Rfl: 0  Current Facility-Administered Medications:    medroxyPROGESTERone (DEPO-PROVERA) injection 150 mg, 150 mg, Intramuscular, Q90 days, Harlin Heys, MD, 150 mg at 01/29/22 6568  Physical exam:  Vitals:   06/13/22 1407  BP: 134/72  Pulse: 80  Resp: 16  Temp: 98.9 F (37.2 C)  TempSrc: Tympanic  SpO2: 95%  Weight: 259 lb 14.4 oz (117.9 kg)   Physical Exam Constitutional:      General: She is not in acute distress. Cardiovascular:  Rate and Rhythm: Normal rate and regular  rhythm.     Heart sounds: Normal heart sounds.  Pulmonary:     Effort: Pulmonary effort is normal.     Breath sounds: Normal breath sounds.  Skin:    General: Skin is warm and dry.  Neurological:     Mental Status: She is alert and oriented to person, place, and time.         Latest Ref Rng & Units 11/08/2020   10:22 AM  CMP  Glucose 65 - 99 mg/dL 124   BUN 7 - 25 mg/dL 7   Creatinine 0.50 - 1.10 mg/dL 0.64   Sodium 135 - 146 mmol/L 140   Potassium 3.5 - 5.3 mmol/L 4.2   Chloride 98 - 110 mmol/L 103   CO2 20 - 32 mmol/L 28   Calcium 8.6 - 10.2 mg/dL 9.6   Total Protein 6.1 - 8.1 g/dL 7.2   Total Bilirubin 0.2 - 1.2 mg/dL 0.6   AST 10 - 35 U/L 18   ALT 6 - 29 U/L 29       Latest Ref Rng & Units 06/13/2022    1:34 PM  CBC  WBC 4.0 - 10.5 K/uL 10.3   Hemoglobin 12.0 - 15.0 g/dL 14.8   Hematocrit 36.0 - 46.0 % 43.3   Platelets 150 - 400 K/uL 247      Assessment and plan- Patient is a 51 y.o. female here for routine follow-up of iron and B12 deficiency anemia  Her CBC is presently normal with an H&H of 14.8/43.3.  She is not anemic.  Iron saturation is normal at 24% with a ferritin level of 80.  B12 levels are normal at 628.  She does not require any IV iron at this time.  We will continue monthly B12 injections here at the cancer center.  We will repeat labs in 6 months in 1 year and see her back in 1 year   Visit Diagnosis 1. Iron deficiency anemia, unspecified iron deficiency anemia type   2. B12 deficiency      Dr. Randa Evens, MD, MPH [facility] [phone] 06/16/2022 8:25 PM

## 2022-06-21 ENCOUNTER — Ambulatory Visit: Payer: Medicare Other | Admitting: Family Medicine

## 2022-06-23 DIAGNOSIS — N3001 Acute cystitis with hematuria: Secondary | ICD-10-CM | POA: Diagnosis not present

## 2022-06-26 ENCOUNTER — Encounter: Payer: Self-pay | Admitting: Family Medicine

## 2022-06-28 ENCOUNTER — Ambulatory Visit (INDEPENDENT_AMBULATORY_CARE_PROVIDER_SITE_OTHER): Payer: Medicare Other | Admitting: Physician Assistant

## 2022-06-28 ENCOUNTER — Telehealth: Payer: Self-pay | Admitting: Physician Assistant

## 2022-06-28 DIAGNOSIS — R319 Hematuria, unspecified: Secondary | ICD-10-CM

## 2022-06-28 NOTE — Telephone Encounter (Signed)
Phone call to patient made to establish connection for telephone visit. LVM for her to return call

## 2022-06-28 NOTE — Progress Notes (Signed)
    Patient: Amanda Webb  DOB: May 05, 1971            Today's Provider: Talitha Givens, MHS, PA-C Introduced myself to the patient as a PA-C and provided education on APPs in clinical practice.      Virtual Visit via Telephone Note  I connected with Amanda Webb on 06/28/22 at  8:20 AM EDT by telephone and verified that I am speaking with the correct person using two identifiers.  Location: Patient: home  Provider: [place], [place]    I discussed the limitations, risks, security and privacy concerns of performing an evaluation and management service by telephone and the availability of in person appointments. I also discussed with the patient that there may be a patient responsible charge related to this service. The patient expressed understanding and agreed to proceed.   History of Present Illness:  States Friday around 3 am went to use the restroom and noted she was urinating blood Went to [place] on Friday and was worked up for a UTI States they treated her for UTI but culture was negative  She reports she was having severe pain with initial episode on Friday  States symptoms have resolved at this time    Review of Systems  Constitutional:  Negative for chills and fever.  Gastrointestinal:  Negative for diarrhea, nausea and vomiting.  Genitourinary:  Negative for dysuria, flank pain and hematuria.  Musculoskeletal:  Negative for myalgias.  Neurological:  Negative for dizziness and headaches.      Observations/Objective:  Patient sounds alert and oriented x3  No acute distress verbalized or heard while on the phone She is able to maintain and engage in normal conversation without signs of respiratory distress or confusion   Assessment and Plan:  Problem List Items Addressed This Visit   None Visit Diagnoses     Painful hematuria    -  Primary Acute, resolved  Reports gross hematuria with dysuria x 1 episode early Friday morning Was  evaluated at [facility] for UTI and given IM ceftriaxone and Bactrim Urine culture was negative for bacterial findings Suspect she was passing a kidney stone at the time given UA results, negative culture, symptoms and resolution at this time  Recommend she dc bactrim as culture was negative  Reviewed ED and return precautions and provided kidney stone education- recommend she stay well hydrated and call if symptoms return  Patient expressed understanding and gratitude for reassurance  Follow up as needed      Follow Up Instructions:    I discussed the assessment and treatment plan with the patient. The patient was provided an opportunity to ask questions and all were answered. The patient agreed with the plan and demonstrated an understanding of the instructions.   The patient was advised to call back or seek an in-person evaluation if the symptoms worsen or if the condition fails to improve as anticipated.  I provided 15 minutes of non-face-to-face time during this encounter.   Zarin Knupp E Nyeem Stoke, PA-C    No follow-ups on file.   I, Uilani Sanville E Theresa Wedel, PA-C, have reviewed all documentation for this visit. The documentation on 06/28/22 for the exam, diagnosis, procedures, and orders are all accurate and complete.   Talitha Givens, MHS, PA-C [facility]

## 2022-07-02 ENCOUNTER — Ambulatory Visit (INDEPENDENT_AMBULATORY_CARE_PROVIDER_SITE_OTHER): Payer: Medicare Other | Admitting: Obstetrics and Gynecology

## 2022-07-02 VITALS — BP 145/84 | HR 77 | Wt 260.4 lb

## 2022-07-02 DIAGNOSIS — Z3042 Encounter for surveillance of injectable contraceptive: Secondary | ICD-10-CM

## 2022-07-02 MED ORDER — MEDROXYPROGESTERONE ACETATE 150 MG/ML IM SUSP
150.0000 mg | Freq: Once | INTRAMUSCULAR | Status: AC
  Administered 2022-07-02: 150 mg via INTRAMUSCULAR

## 2022-07-02 NOTE — Progress Notes (Signed)
Date last pap: 09/27/2020. Last Depo-Provera: 04/16/2022 Side Effects if any: None. Serum HCG indicated? N/A. Depo-Provera 150 mg IM given by: Douglass Rivers, CMA Next appointment due: October 2-October 01 2022

## 2022-07-03 NOTE — Telephone Encounter (Signed)
Made in error

## 2022-07-12 ENCOUNTER — Encounter: Payer: Self-pay | Admitting: Family Medicine

## 2022-07-12 ENCOUNTER — Ambulatory Visit (INDEPENDENT_AMBULATORY_CARE_PROVIDER_SITE_OTHER): Payer: Medicare Other | Admitting: Family Medicine

## 2022-07-12 VITALS — BP 150/71 | HR 76 | Ht 67.0 in | Wt 254.4 lb

## 2022-07-12 DIAGNOSIS — E1169 Type 2 diabetes mellitus with other specified complication: Secondary | ICD-10-CM | POA: Diagnosis not present

## 2022-07-12 DIAGNOSIS — H9201 Otalgia, right ear: Secondary | ICD-10-CM | POA: Diagnosis not present

## 2022-07-12 DIAGNOSIS — H6981 Other specified disorders of Eustachian tube, right ear: Secondary | ICD-10-CM

## 2022-07-12 MED ORDER — OZEMPIC (1 MG/DOSE) 4 MG/3ML ~~LOC~~ SOPN: 1.0000 mg | PEN_INJECTOR | SUBCUTANEOUS | 1 refills | Status: DC

## 2022-07-12 NOTE — Patient Instructions (Addendum)
Thank you for coming to the office today.  You have some Eustachian Tube Dysfunction, this problem is usually caused by some deeper sinus swelling and pressure, causing difficulty of eustachian tubes to clear fluid from behind ear drum. You can have ear pain, pressure, fullness, loss of hearing. Often related to sinus symptoms and sometimes with sinusitis or infection or allergy symptoms.  No sign of infection. No redness or pus.  Treatment: - Continue on the nasal sprays - Flonase and Azelastine are good to continue. - Continue Zyrtec - Add OTC behind the counter Sudafed can take it regularly 3-7 day range for resolving pressure / congestion and improving pain. - If you do return to the Ibuprofen would recommend at least 3 to 4 pills = 600-'800mg'$  max dose up to max 3 times a day with meal.  Back up plan if not improving would be oral steroid prednisone course, not always long term resolution.  Referral to ENT for further consultation.  [facility] [id number]  [place], [place] [address] Ph: [phone]   Please schedule a Follow-up Appointment to: Return if symptoms worsen or fail to improve.  If you have any other questions or concerns, please feel free to call the office or send a message through [place]. You may also schedule an earlier appointment if necessary.  Additionally, you may be receiving a survey about your experience at our office within a few days to 1 week by e-mail or mail. We value your feedback.  [name], DO [place]

## 2022-07-12 NOTE — Progress Notes (Addendum)
Subjective:    Patient ID: Amanda Webb, female    DOB: [birth year], 51 y.o.   MRN: [id number]  Amanda Webb is a 51 y.o. female presenting on 07/12/2022 for ear infection   HPI  Right Ear Pain / Eustachian Tube Dysfunction Reports recent history about 1 month ago, was dx at [facility] with ear infection given Amoxicillin BID x 10 days without resolution. She also had symptoms of R ear pressure and pain. Felt difficulty "popping" ear due to pressure and fullness. She saw her dentist and did evaluation and there was no dental cause of her symptoms.   Additional up Recent UC visit UTI had hematuria 7/8, culture negative, she passed kidney stone  Type 2 Diabetes Improving with 5 lb wt loss, A1c to 6.9 previously She requests dose increase on Ozempic, after taking regimen for past 3 months.      02/21/2022    9:32 AM 12/19/2021    9:42 AM 10/06/2021   10:07 AM  Depression screen PHQ 2/9  Decreased Interest '2 2 1  '$ Down, Depressed, Hopeless '1 3 1  '$ PHQ - 2 Score '3 5 2  '$ Altered sleeping '1 3 1  '$ Tired, decreased energy '2 2 1  '$ Change in appetite 0 2 1  Feeling bad or failure about yourself  '1 3 1  '$ Trouble concentrating 0 1 1  Moving slowly or fidgety/restless 0 0 1  Suicidal thoughts 0 0 0  PHQ-9 Score '7 16 8  '$ Difficult doing work/chores Not difficult at all Extremely dIfficult Somewhat difficult    Social History   Tobacco Use  . Smoking status: Every Day    Packs/day: 1.00    Years: 32.00    Total pack years: 32.00    Types: Cigarettes  . Smokeless tobacco: Never  . Tobacco comments:    1 ppd/ 11/22/2020  Vaping Use  . Vaping Use: Never used  Substance Use Topics  . Alcohol use: Yes    Comment: wine occ  . Drug use: No    Review of Systems Per HPI unless specifically indicated above     Objective:    BP (!) 150/71   Pulse 76   Ht '5\' 7"'$  (1.702 m)   Wt 254 lb 6.4 oz (115.4 kg)   SpO2 100%   BMI 39.84 kg/m   Wt Readings from Last 3 Encounters:  07/12/22 254 lb  6.4 oz (115.4 kg)  07/02/22 260 lb 6.4 oz (118.1 kg)  06/13/22 259 lb 14.4 oz (117.9 kg)    Physical Exam Vitals and nursing note reviewed.  Constitutional:      General: She is not in acute distress.    Appearance: She is well-developed. She is obese. She is not diaphoretic.     Comments: Well-appearing, comfortable, cooperative  HENT:     Head: Normocephalic and atraumatic.     Right Ear: Ear canal and external ear normal. There is no impacted cerumen.     Left Ear: Tympanic membrane, ear canal and external ear normal. There is no impacted cerumen.     Ears:     Comments: R TM with clear effusion and bulging fullness. No erythema. No haziness or opacity or purulence. Eyes:     General:        Right eye: No discharge.        Left eye: No discharge.     Conjunctiva/sclera: Conjunctivae normal.  Neck:     Thyroid: No thyromegaly.  Cardiovascular:  Rate and Rhythm: Normal rate and regular rhythm.     Heart sounds: Normal heart sounds. No murmur heard. Pulmonary:     Effort: Pulmonary effort is normal. No respiratory distress.     Breath sounds: Normal breath sounds. No wheezing or rales.  Musculoskeletal:        General: Normal range of motion.     Cervical back: Normal range of motion and neck supple.  Lymphadenopathy:     Cervical: No cervical adenopathy.  Skin:    General: Skin is warm and dry.     Findings: No erythema or rash.  Neurological:     Mental Status: She is alert and oriented to person, place, and time.  Psychiatric:        Behavior: Behavior normal.     Comments: Well groomed, good eye contact, normal speech and thoughts   Results for orders placed or performed in visit on 06/13/22  Vitamin B12  Result Value Ref Range   Vitamin B-12 628 180 - 914 pg/mL  Iron and TIBC  Result Value Ref Range   Iron 89 28 - 170 ug/dL   TIBC 379 250 - 450 ug/dL   Saturation Ratios 24 10.4 - 31.8 %   UIBC 290 ug/dL  Ferritin  Result Value Ref Range   Ferritin 80 11  - 307 ng/mL  CBC with Differential/Platelet  Result Value Ref Range   WBC 10.3 4.0 - 10.5 K/uL   RBC 4.61 3.87 - 5.11 MIL/uL   Hemoglobin 14.8 12.0 - 15.0 g/dL   HCT 43.3 36.0 - 46.0 %   MCV 93.9 80.0 - 100.0 fL   MCH 32.1 26.0 - 34.0 pg   MCHC 34.2 30.0 - 36.0 g/dL   RDW 12.5 11.5 - 15.5 %   Platelets 247 150 - 400 K/uL   nRBC 0.0 0.0 - 0.2 %   Neutrophils Relative % 57 %   Neutro Abs 6.0 1.7 - 7.7 K/uL   Lymphocytes Relative 33 %   Lymphs Abs 3.4 0.7 - 4.0 K/uL   Monocytes Relative 6 %   Monocytes Absolute 0.6 0.1 - 1.0 K/uL   Eosinophils Relative 2 %   Eosinophils Absolute 0.2 0.0 - 0.5 K/uL   Basophils Relative 1 %   Basophils Absolute 0.1 0.0 - 0.1 K/uL   Immature Granulocytes 1 %   Abs Immature Granulocytes 0.05 0.00 - 0.07 K/uL      Assessment & Plan:   Problem List Items Addressed This Visit     Type 2 diabetes mellitus with other specified complication (HCC)   Relevant Medications   OZEMPIC, 1 MG/DOSE, 4 MG/3ML SOPN   Other Visit Diagnoses     Eustachian tube dysfunction, right    -  Primary   Relevant Orders   Ambulatory referral to ENT   Right ear pain       Relevant Orders   Ambulatory referral to ENT       Type 2 DM Obesity Improving on GLP1 ozempic Dose increase from 0.'5mg'$  up to '1mg'$  weekly, new rx sent, she can finish her current 0.5 doses first  Acute on chronic R eustachian tube dysfunction with secondary R ear effusion, without loss of hearing or evidence of AOM or sinusitis. Likely related allergic rhinosinusitis based on exam and history. Failed antibiotic course augmentin recently  Plan: 1. Add OTC oral decongestant Sudafed, continue Ibuprofen for pain 2. Continue Flonase and Azelastine and oral anti histamine 3. Consider oral steroid but avoid d/t blood  sugar and weight, goal to avoid oral prednisone  Referral to [facility]  Orders Placed This Encounter  Procedures  . Ambulatory referral to ENT    Referral Priority:   Routine     Referral Type:   Consultation    Referral Reason:   Specialty Services Required    Requested Specialty:   Otolaryngology    Number of Visits Requested:   1      Orders Placed This Encounter  Procedures  . Ambulatory referral to ENT    Referral Priority:   Routine    Referral Type:   Consultation    Referral Reason:   Specialty Services Required    Requested Specialty:   Otolaryngology    Number of Visits Requested:   1       Meds ordered this encounter  Medications  . OZEMPIC, 1 MG/DOSE, 4 MG/3ML SOPN    Sig: Inject 1 mg into the skin once a week.    Dispense:  9 mL    Refill:  1    Dose increase from 0.5 up to '1mg'$ , patient not ready for fill yet, she can request when ready.      Follow up plan: Return if symptoms worsen or fail to improve.   [name], [facility] 07/12/2022, 11:23 AM

## 2022-07-13 ENCOUNTER — Inpatient Hospital Stay: Payer: Medicare Other | Attending: Oncology

## 2022-07-13 DIAGNOSIS — E538 Deficiency of other specified B group vitamins: Secondary | ICD-10-CM | POA: Insufficient documentation

## 2022-07-13 DIAGNOSIS — D509 Iron deficiency anemia, unspecified: Secondary | ICD-10-CM

## 2022-07-13 MED ORDER — CYANOCOBALAMIN 1000 MCG/ML IJ SOLN
1000.0000 ug | Freq: Once | INTRAMUSCULAR | Status: AC
  Administered 2022-07-13: 1000 ug via INTRAMUSCULAR
  Filled 2022-07-13: qty 1

## 2022-07-19 ENCOUNTER — Encounter: Payer: Self-pay | Admitting: Intensive Care

## 2022-07-19 ENCOUNTER — Other Ambulatory Visit: Payer: Self-pay

## 2022-07-19 ENCOUNTER — Emergency Department
Admission: EM | Admit: 2022-07-19 | Discharge: 2022-07-19 | Disposition: A | Discharge status: Home / Self Care | Payer: Medicare Other | Attending: Emergency Medicine | Admitting: Emergency Medicine

## 2022-07-19 DIAGNOSIS — M542 Cervicalgia: Secondary | ICD-10-CM | POA: Insufficient documentation

## 2022-07-19 DIAGNOSIS — E039 Hypothyroidism, unspecified: Secondary | ICD-10-CM | POA: Insufficient documentation

## 2022-07-19 DIAGNOSIS — H6504 Acute serous otitis media, recurrent, right ear: Secondary | ICD-10-CM | POA: Diagnosis not present

## 2022-07-19 DIAGNOSIS — E119 Type 2 diabetes mellitus without complications: Secondary | ICD-10-CM | POA: Diagnosis not present

## 2022-07-19 DIAGNOSIS — I10 Essential (primary) hypertension: Secondary | ICD-10-CM | POA: Insufficient documentation

## 2022-07-19 DIAGNOSIS — H9201 Otalgia, right ear: Secondary | ICD-10-CM | POA: Diagnosis present

## 2022-07-19 DIAGNOSIS — H6501 Acute serous otitis media, right ear: Secondary | ICD-10-CM | POA: Diagnosis not present

## 2022-07-19 HISTORY — DX: Type 2 diabetes mellitus without complications: E11.9

## 2022-07-19 MED ORDER — AMOXICILLIN-POT CLAVULANATE 875-125 MG PO TABS: 1.0000 | ORAL_TABLET | Freq: Two times a day (BID) | ORAL | 0 refills | Status: AC

## 2022-07-19 MED ORDER — PREDNISONE 10 MG (21) PO TBPK: ORAL_TABLET | ORAL | 0 refills | Status: DC

## 2022-07-19 NOTE — Discharge Instructions (Signed)
Please take the medications as prescribed.  Be aware that the prednisone may make your blood sugar higher than usual.  Please maintain a diabetic diet.  Follow-up with your primary care provider if not improving over the next few days and keep your upcoming appointment with the ENT specialist.  If your symptoms change or worsen please return to the emergency department.

## 2022-07-19 NOTE — ED Triage Notes (Signed)
Patient was treated X4 weeks ago at [facility] for right ear infection. Recently got on plane and once back her right ear started hurting again 07/13/22. Now is experiencing right side neck and head pain along with the right ear pain.

## 2022-07-19 NOTE — ED Provider Notes (Signed)
[facility] Provider Note    Event Date/Time   First MD Initiated Contact with Patient 07/19/22 1727     (approximate)   History   Ear Pain and Neck Pain   HPI  Amanda Webb is a 51 y.o. female presents to the emergency department for treatment and evaluation of right earache and now has right side headache and neck pain.  Patient was treated approximately 1 month ago for an ear infection.  She states that she completed her antibiotics as prescribed and the pain had completely gone away.  She went on a trip and after flying home, right ear pain started a few days later.  No decrease in ability to hear.  No known fever.  Past Medical History:  Diagnosis Date   Allergy    Anemia    Anxiety    Arrhythmia    Arthritis    Cervical dysplasia    Cystitis    Depression    Dyspnea    GERD (gastroesophageal reflux disease)    Gross hematuria    Headache    Heart murmur    asymptomatic   History of methicillin resistant staphylococcus aureus (MRSA) 2013   HLD (hyperlipidemia)    Hypertension    Hypothyroid    Lymphedema    Palpitations    Pernicious anemia    Tobacco abuse    Type 2 diabetes mellitus ([place])      Physical Exam   Triage Vital Signs: ED Triage Vitals  Enc Vitals Group     BP 07/19/22 1650 (!) 151/88     Pulse Rate 07/19/22 1650 (!) 101     Resp 07/19/22 1650 16     Temp 07/19/22 1650 98.6 F (37 C)     Temp Source 07/19/22 1650 Oral     SpO2 07/19/22 1650 98 %     Weight 07/19/22 1647 250 lb (113.4 kg)     Height 07/19/22 1647 '5\' 7"'$  (1.702 m)     Head Circumference --      Peak Flow --      Pain Score 07/19/22 1646 7     Pain Loc --      Pain Edu? --      Excl. in [place]? --     Most recent vital signs: Vitals:   07/19/22 1650  BP: (!) 151/88  Pulse: (!) 101  Resp: 16  Temp: 98.6 F (37 C)  SpO2: 98%    General: Awake, no distress.  CV:  Good peripheral perfusion.  Resp:  Normal effort.  Abd:  No distention.   Other:  Tympanic membrane on the right is erythematous and dull. No cervical adenopathy.  No meningismus.   ED Results / Procedures / Treatments   Labs (all labs ordered are listed, but only abnormal results are displayed) Labs Reviewed - No data to display   EKG  Not indicated.   RADIOLOGY  Not indicated.   I have independently reviewed and interpreted imaging as well as reviewed report from radiology.  PROCEDURES:  Critical Care performed: No  Procedures   MEDICATIONS ORDERED IN ED:  Medications - No data to display   IMPRESSION / MDM / [place] / ED COURSE   I reviewed the triage vital signs and the nursing notes.  Differential diagnosis includes, but is not limited to: 51 year old female presenting to the emergency department for treatment and evaluation of right earache and right-sided neck pain with headache that started several  days ago.  See HPI for further details.  On exam, she does have some erythema of the tympanic membrane which is also dull.  There is some erythema on the EAC close to the eardrum as well.  She has no focal lymphadenopathy of her neck.  Plan will be to treat her with Augmentin and a tapered dose of prednisone.  She was advised that the prednisone may increase her glucose levels and was strongly encouraged to maintain a diabetic diet.  Return precautions discussed.  She is to see primary care if not improving over the next few days after starting antibiotics.  She has an appointment with ENT on the 30th and was encouraged to keep that appointment as scheduled.  Patient's presentation is most consistent with acute, uncomplicated illness.       FINAL CLINICAL IMPRESSION(S) / ED DIAGNOSES   Final diagnoses:  Right acute serous otitis media, recurrence not specified     Rx / DC Orders   ED Discharge Orders          Ordered    amoxicillin-clavulanate (AUGMENTIN) 875-125 MG tablet  2 times daily        07/19/22 1740     predniSONE (STERAPRED UNI-PAK 21 TAB) 10 MG (21) TBPK tablet        07/19/22 1740             Note:  This document was prepared using Dragon voice recognition software and may include unintentional dictation errors.   Victorino Dike, FNP 07/19/22 Rayburn Felt, MD 07/19/22 [phone]

## 2022-07-19 NOTE — ED Notes (Signed)
See triage note  Presents with right ear pain   States her PCP treated her for ear infection   States now the pain is worse and she is also have pain to right side of face

## 2022-07-23 ENCOUNTER — Ambulatory Visit (INDEPENDENT_AMBULATORY_CARE_PROVIDER_SITE_OTHER): Payer: Medicare Other | Admitting: Family Medicine

## 2022-07-23 ENCOUNTER — Encounter: Payer: Self-pay | Admitting: Family Medicine

## 2022-07-23 VITALS — BP 146/75 | HR 70 | Ht 67.0 in | Wt 252.0 lb

## 2022-07-23 DIAGNOSIS — H6981 Other specified disorders of Eustachian tube, right ear: Secondary | ICD-10-CM | POA: Diagnosis not present

## 2022-07-23 DIAGNOSIS — H9201 Otalgia, right ear: Secondary | ICD-10-CM

## 2022-07-23 MED ORDER — PREDNISONE 20 MG PO TABS: ORAL_TABLET | ORAL | 0 refills | Status: DC

## 2022-07-23 NOTE — Progress Notes (Signed)
Subjective:    Patient ID: Amanda Webb, female    DOB: 1971/02/05, 51 y.o.   MRN: [id number]  Amanda Webb is a 51 y.o. female presenting on 07/23/2022 for Ear Pain and Neck Pain   HPI  Right Ear Pain / Eustachian Tube Dysfunction Reports recent history about 1 month ago, was dx at [facility] with ear infection given Amoxicillin BID x 10 days without resolution. She also had symptoms of R ear pressure and pain. Felt difficulty "popping" ear due to pressure and fullness. She saw her dentist and did evaluation and there was no dental cause of her symptoms. Improved on prednisone still has symptoms of ear pain fullness  [facility] upcoming on 8/30       02/21/2022    9:32 AM 12/19/2021    9:42 AM 10/06/2021   10:07 AM  Depression screen PHQ 2/9  Decreased Interest '2 2 1  '$ Down, Depressed, Hopeless '1 3 1  '$ PHQ - 2 Score '3 5 2  '$ Altered sleeping '1 3 1  '$ Tired, decreased energy '2 2 1  '$ Change in appetite 0 2 1  Feeling bad or failure about yourself  '1 3 1  '$ Trouble concentrating 0 1 1  Moving slowly or fidgety/restless 0 0 1  Suicidal thoughts 0 0 0  PHQ-9 Score '7 16 8  '$ Difficult doing work/chores Not difficult at all Extremely dIfficult Somewhat difficult    Social History   Tobacco Use   Smoking status: Every Day    Packs/day: 1.00    Years: 32.00    Total pack years: 32.00    Types: Cigarettes   Smokeless tobacco: Never   Tobacco comments:    1 ppd/ 11/22/2020  Vaping Use   Vaping Use: Never used  Substance Use Topics   Alcohol use: Yes    Alcohol/week: 3.0 standard drinks of alcohol    Types: 3 Glasses of wine per week   Drug use: No    Review of Systems Per HPI unless specifically indicated above     Objective:    BP (!) 146/75   Pulse 70   Ht '5\' 7"'$  (1.702 m)   Wt 252 lb (114.3 kg)   SpO2 99%   BMI 39.47 kg/m   Wt Readings from Last 3 Encounters:  07/23/22 252 lb (114.3 kg)  07/19/22 250 lb (113.4 kg)  07/12/22 254 lb 6.4 oz (115.4 kg)    Physical  Exam Vitals and nursing note reviewed.  Constitutional:      General: She is not in acute distress.    Appearance: Normal appearance. She is well-developed. She is not diaphoretic.     Comments: Well-appearing, comfortable, cooperative  HENT:     Head: Normocephalic and atraumatic.     Right Ear: Tympanic membrane, ear canal and external ear normal. There is no impacted cerumen.     Left Ear: Tympanic membrane, ear canal and external ear normal. There is no impacted cerumen.     Ears:     Comments: Effusion R>L clear Eyes:     General:        Right eye: No discharge.        Left eye: No discharge.     Conjunctiva/sclera: Conjunctivae normal.  Cardiovascular:     Rate and Rhythm: Normal rate.  Pulmonary:     Effort: Pulmonary effort is normal.  Skin:    General: Skin is warm and dry.     Findings: No erythema or rash.  Neurological:  Mental Status: She is alert and oriented to person, place, and time.  Psychiatric:        Mood and Affect: Mood normal.        Behavior: Behavior normal.        Thought Content: Thought content normal.     Comments: Well groomed, good eye contact, normal speech and thoughts    Results for orders placed or performed in visit on 06/13/22  Vitamin B12  Result Value Ref Range   Vitamin B-12 628 180 - 914 pg/mL  Iron and TIBC  Result Value Ref Range   Iron 89 28 - 170 ug/dL   TIBC 379 250 - 450 ug/dL   Saturation Ratios 24 10.4 - 31.8 %   UIBC 290 ug/dL  Ferritin  Result Value Ref Range   Ferritin 80 11 - 307 ng/mL  CBC with Differential/Platelet  Result Value Ref Range   WBC 10.3 4.0 - 10.5 K/uL   RBC 4.61 3.87 - 5.11 MIL/uL   Hemoglobin 14.8 12.0 - 15.0 g/dL   HCT 43.3 36.0 - 46.0 %   MCV 93.9 80.0 - 100.0 fL   MCH 32.1 26.0 - 34.0 pg   MCHC 34.2 30.0 - 36.0 g/dL   RDW 12.5 11.5 - 15.5 %   Platelets 247 150 - 400 K/uL   nRBC 0.0 0.0 - 0.2 %   Neutrophils Relative % 57 %   Neutro Abs 6.0 1.7 - 7.7 K/uL   Lymphocytes Relative 33 %    Lymphs Abs 3.4 0.7 - 4.0 K/uL   Monocytes Relative 6 %   Monocytes Absolute 0.6 0.1 - 1.0 K/uL   Eosinophils Relative 2 %   Eosinophils Absolute 0.2 0.0 - 0.5 K/uL   Basophils Relative 1 %   Basophils Absolute 0.1 0.0 - 0.1 K/uL   Immature Granulocytes 1 %   Abs Immature Granulocytes 0.05 0.00 - 0.07 K/uL      Assessment & Plan:   Problem List Items Addressed This Visit   None Visit Diagnoses     Eustachian tube dysfunction, right    -  Primary   Relevant Medications   predniSONE (DELTASONE) 20 MG tablet   Right ear pain       Relevant Medications   predniSONE (DELTASONE) 20 MG tablet       Finish current prednisone taper Okay to overlap with Ibuprofen on last 2 days of your steroid taper if needed.  Okay to keep on Sudafed if you feel some reduction in sinus pressure after, otherwise may pause if no longer needed.  Keep ENT on 8/30  No sign of ear infection today still. Seems the treatment has worked. No repeat antibiotics  Will print back up plan Prednisone modified burst taper if indicated in future. Use with caution. Do not start now.  Meds ordered this encounter  Medications   predniSONE (DELTASONE) 20 MG tablet    Sig: Take daily with food. Start with '40mg'$  (2 pills) x 2 days, then reduce to '20mg'$  (1 pills) x 2 days, then '10mg'$  (half pill) x 4 days    Dispense:  8 tablet    Refill:  0      Follow up plan: Return if symptoms worsen or fail to improve.  [name], [facility] 07/23/2022, 9:52 AM

## 2022-07-23 NOTE — Patient Instructions (Addendum)
Thank you for coming to the office today.  Okay to overlap with Ibuprofen on last 2 days of your steroid taper if needed.  Okay to keep on Sudafed if you feel some reduction in sinus pressure after, otherwise may pause if no longer needed.  Keep ENT on 8/30  No sign of ear infection today still. Seems the treatment has worked.   Please schedule a Follow-up Appointment to: Return if symptoms worsen or fail to improve.  If you have any other questions or concerns, please feel free to call the office or send a message through [place]. You may also schedule an earlier appointment if necessary.  Additionally, you may be receiving a survey about your experience at our office within a few days to 1 week by e-mail or mail. We value your feedback.  [name], DO [place]

## 2022-07-27 ENCOUNTER — Ambulatory Visit: Payer: Medicare Other

## 2022-08-02 DIAGNOSIS — F4310 Post-traumatic stress disorder, unspecified: Secondary | ICD-10-CM | POA: Diagnosis not present

## 2022-08-09 DIAGNOSIS — K219 Gastro-esophageal reflux disease without esophagitis: Secondary | ICD-10-CM | POA: Diagnosis not present

## 2022-08-09 DIAGNOSIS — H9201 Otalgia, right ear: Secondary | ICD-10-CM | POA: Diagnosis not present

## 2022-08-09 DIAGNOSIS — H93291 Other abnormal auditory perceptions, right ear: Secondary | ICD-10-CM | POA: Diagnosis not present

## 2022-08-09 DIAGNOSIS — F17200 Nicotine dependence, unspecified, uncomplicated: Secondary | ICD-10-CM | POA: Diagnosis not present

## 2022-08-10 ENCOUNTER — Encounter: Payer: Self-pay | Admitting: Family Medicine

## 2022-08-13 ENCOUNTER — Encounter: Payer: Self-pay | Admitting: Family Medicine

## 2022-08-13 ENCOUNTER — Inpatient Hospital Stay: Payer: Medicare Other | Attending: Oncology

## 2022-08-13 ENCOUNTER — Ambulatory Visit
Admission: RE | Admit: 2022-08-13 | Discharge: 2022-08-13 | Disposition: A | Discharge status: Home / Self Care | Payer: Medicare Other | Attending: Family Medicine | Admitting: Family Medicine

## 2022-08-13 ENCOUNTER — Ambulatory Visit (INDEPENDENT_AMBULATORY_CARE_PROVIDER_SITE_OTHER): Payer: Medicare Other | Admitting: Family Medicine

## 2022-08-13 ENCOUNTER — Other Ambulatory Visit: Payer: Self-pay | Admitting: Family Medicine

## 2022-08-13 ENCOUNTER — Ambulatory Visit
Admission: RE | Admit: 2022-08-13 | Discharge: 2022-08-13 | Disposition: A | Discharge status: Home / Self Care | Payer: Medicare Other | Source: Ambulatory Visit | Attending: Family Medicine | Admitting: Family Medicine

## 2022-08-13 VITALS — BP 142/78 | HR 89 | Temp 99.4°F | Ht 67.0 in | Wt 259.8 lb

## 2022-08-13 DIAGNOSIS — N3001 Acute cystitis with hematuria: Secondary | ICD-10-CM

## 2022-08-13 DIAGNOSIS — N200 Calculus of kidney: Secondary | ICD-10-CM

## 2022-08-13 DIAGNOSIS — R319 Hematuria, unspecified: Secondary | ICD-10-CM | POA: Diagnosis not present

## 2022-08-13 DIAGNOSIS — E538 Deficiency of other specified B group vitamins: Secondary | ICD-10-CM | POA: Insufficient documentation

## 2022-08-13 DIAGNOSIS — I878 Other specified disorders of veins: Secondary | ICD-10-CM | POA: Diagnosis not present

## 2022-08-13 DIAGNOSIS — R300 Dysuria: Secondary | ICD-10-CM | POA: Diagnosis not present

## 2022-08-13 DIAGNOSIS — D509 Iron deficiency anemia, unspecified: Secondary | ICD-10-CM

## 2022-08-13 LAB — POCT URINALYSIS DIPSTICK
Bilirubin, UA: NEGATIVE
Glucose, UA: NEGATIVE
Ketones, UA: NEGATIVE
Nitrite, UA: NEGATIVE
Protein, UA: POSITIVE — AB
Spec Grav, UA: 1.015 (ref 1.010–1.025)
Urobilinogen, UA: 0.2 E.U./dL
pH, UA: 6 (ref 5.0–8.0)

## 2022-08-13 MED ORDER — CYANOCOBALAMIN 1000 MCG/ML IJ SOLN
1000.0000 ug | Freq: Once | INTRAMUSCULAR | Status: AC
  Administered 2022-08-13: 1000 ug via INTRAMUSCULAR
  Filled 2022-08-13: qty 1

## 2022-08-13 MED ORDER — TAMSULOSIN HCL 0.4 MG PO CAPS: 0.4000 mg | ORAL_CAPSULE | Freq: Every day | ORAL | 0 refills | Status: DC

## 2022-08-13 MED ORDER — SULFAMETHOXAZOLE-TRIMETHOPRIM 800-160 MG PO TABS: 1.0000 | ORAL_TABLET | Freq: Two times a day (BID) | ORAL | 0 refills | Status: AC

## 2022-08-13 NOTE — Progress Notes (Signed)
Subjective:    Patient ID: Amanda Webb, female    DOB: July 31, 1971, 51 y.o.   MRN: [id number]  Amanda Webb is a 51 y.o. female presenting on 08/13/2022 for Urinary Tract Infection   HPI  UTI vs Kidney Stone Prior history of kidney stone. New problem past few days with dysuria. Hematuria. Temp elevated yesterday 100.9F now improved on ibuprofen Admits some back aching yesterday Previously has history of kidney stone she passed in early July 2023 at [facility] She was on Bactrim previously and she stopped early after urine culture. Admits dysuria She has 5 days only of Bactrim, started yesterday, has 4 remaining days. Admits back in July 2023 she had more gross hematuria. Now she only has blood tinged urine pink color.     08/13/2022   11:18 AM 07/23/2022   10:23 AM 02/21/2022    9:32 AM  Depression screen PHQ 2/9  Decreased Interest '2 1 2  '$ Down, Depressed, Hopeless '1 1 1  '$ PHQ - 2 Score '3 2 3  '$ Altered sleeping '3 1 1  '$ Tired, decreased energy '2 1 2  '$ Change in appetite 1 1 0  Feeling bad or failure about yourself  '1 1 1  '$ Trouble concentrating 0 1 0  Moving slowly or fidgety/restless 0 1 0  Suicidal thoughts 0 0 0  PHQ-9 Score '10 8 7  '$ Difficult doing work/chores Somewhat difficult Somewhat difficult Not difficult at all    Social History   Tobacco Use   Smoking status: Every Day    Packs/day: 1.00    Years: 32.00    Total pack years: 32.00    Types: Cigarettes   Smokeless tobacco: Never   Tobacco comments:    1 ppd/ 11/22/2020  Vaping Use   Vaping Use: Never used  Substance Use Topics   Alcohol use: Yes    Alcohol/week: 3.0 standard drinks of alcohol    Types: 3 Glasses of wine per week   Drug use: No    Review of Systems Per HPI unless specifically indicated above     Objective:    BP (!) 142/78   Pulse 89   Temp 99.4 F (37.4 C) (Oral)   Ht '5\' 7"'$  (1.702 m)   Wt 259 lb 12.8 oz (117.8 kg)   SpO2 99%   BMI 40.69 kg/m   Wt Readings from Last 3  Encounters:  08/13/22 259 lb 12.8 oz (117.8 kg)  07/23/22 252 lb (114.3 kg)  07/19/22 250 lb (113.4 kg)    Physical Exam Vitals and nursing note reviewed.  Constitutional:      General: She is not in acute distress.    Appearance: Normal appearance. She is well-developed. She is obese. She is not diaphoretic.     Comments: Well-appearing, comfortable, cooperative  HENT:     Head: Normocephalic and atraumatic.  Eyes:     General:        Right eye: No discharge.        Left eye: No discharge.     Conjunctiva/sclera: Conjunctivae normal.  Cardiovascular:     Rate and Rhythm: Normal rate.  Pulmonary:     Effort: Pulmonary effort is normal.  Skin:    General: Skin is warm and dry.     Findings: No erythema or rash.  Neurological:     Mental Status: She is alert and oriented to person, place, and time.  Psychiatric:        Mood and Affect: Mood normal.  Behavior: Behavior normal.        Thought Content: Thought content normal.     Comments: Well groomed, good eye contact, normal speech and thoughts       Results for orders placed or performed in visit on 08/13/22  POCT Urinalysis Dipstick  Result Value Ref Range   Color, UA Yellow    Clarity, UA Cloudy    Glucose, UA Negative Negative   Bilirubin, UA Negative    Ketones, UA Negative    Spec Grav, UA 1.015 1.010 - 1.025   Blood, UA Small +    pH, UA 6.0 5.0 - 8.0   Protein, UA Positive (A) Negative   Urobilinogen, UA 0.2 0.2 or 1.0 E.U./dL   Nitrite, UA Negative    Leukocytes, UA Trace (A) Negative   Appearance     Odor        Assessment & Plan:   Problem List Items Addressed This Visit   None Visit Diagnoses     Acute cystitis with hematuria    -  Primary   Relevant Medications   sulfamethoxazole-trimethoprim (BACTRIM DS) 800-160 MG tablet   Other Relevant Orders   DG Abd 1 View   Hematuria, unspecified type       Relevant Orders   POCT Urinalysis Dipstick (Completed)   Urine Culture   Kidney  stone       Relevant Medications   tamsulosin (FLOMAX) 0.4 MG CAPS capsule   Other Relevant Orders   DG Abd 1 View       History suggestive of UTI but cannot rule out nephrolithiasis Incomplete antibiotic course so far Will complete 10 day bactrim BID dosing for possible UTI UA done Urine culture pending Check STAT KUB X-ray today to eval or rule out kidney stone Add Tamsulosin for passing stone if needed May reconsider Urologist in near future if indicated still  Orders Placed This Encounter  Procedures   Urine Culture   DG Abd 1 View    Standing Status:   Future    Standing Expiration Date:   11/13/2022    Order Specific Question:   Reason for Exam (SYMPTOM  OR DIAGNOSIS REQUIRED)    Answer:   possible kidney stone    Order Specific Question:   Is the patient pregnant?    Answer:   No    Order Specific Question:   Preferred imaging location?    Answer:   [facility]   POCT Urinalysis Dipstick     Meds ordered this encounter  Medications   sulfamethoxazole-trimethoprim (BACTRIM DS) 800-160 MG tablet    Sig: Take 1 tablet by mouth 2 (two) times daily for 5 days.    Dispense:  10 tablet    Refill:  0   tamsulosin (FLOMAX) 0.4 MG CAPS capsule    Sig: Take 1 capsule (0.4 mg total) by mouth daily. For 2 weeks for kidney stone    Dispense:  30 capsule    Refill:  0     Follow up plan: Return if symptoms worsen or fail to improve.   [name], [facility] 08/13/2022, 10:55 AM

## 2022-08-13 NOTE — Patient Instructions (Addendum)
Thank you for coming to the office today.  Finish Bactrim course.  X=-ray today for eval Kidney stone.  Take the Tamsulosin for 1-2 weeks to help pass a kidney stone if present.  Please schedule a Follow-up Appointment to: Return if symptoms worsen or fail to improve.  If you have any other questions or concerns, please feel free to call the office or send a message through [place]. You may also schedule an earlier appointment if necessary.  Additionally, you may be receiving a survey about your experience at our office within a few days to 1 week by e-mail or mail. We value your feedback.  [name], DO [place]

## 2022-08-14 ENCOUNTER — Encounter: Payer: Self-pay | Admitting: Family Medicine

## 2022-08-14 ENCOUNTER — Other Ambulatory Visit: Payer: Self-pay | Admitting: Family Medicine

## 2022-08-14 DIAGNOSIS — J0100 Acute maxillary sinusitis, unspecified: Secondary | ICD-10-CM

## 2022-08-14 LAB — URINE CULTURE
MICRO NUMBER:: 13840353
Result:: NO GROWTH
SPECIMEN QUALITY:: ADEQUATE

## 2022-08-14 NOTE — Telephone Encounter (Signed)
Requested medication (s) are due for refill today: no  Requested medication (s) are on the active medication list: yes  Last refill:  08/13/22  Future visit scheduled: no  Notes to clinic:  Unable to refill per protocol, last refill by provider 08/13/22 for 30 days, possible duplicate.     Requested Prescriptions  Pending Prescriptions Disp Refills   tamsulosin (FLOMAX) 0.4 MG CAPS capsule [Pharmacy Med Name: TAMSULOSIN 0.'4MG'$  CAPSULES] 90 capsule     Sig: TAKE 1 CAPSULE(0.4 MG) BY MOUTH DAILY FOR 2 WEEKS FOR KIDNEY STONE     Urology: Alpha-Adrenergic Blocker Failed - 08/13/2022 11:28 AM      Failed - PSA in normal range and within 360 days    No results found for: "LABPSA", "PSA", "PSA1", "ULTRAPSA"       Failed - Last BP in normal range    BP Readings from Last 1 Encounters:  08/13/22 (!) 142/78         Passed - Valid encounter within last 12 months    Recent Outpatient Visits           Yesterday Acute cystitis with hematuria   [place], DO   3 weeks ago Eustachian tube dysfunction, right   [place], DO   1 month ago Eustachian tube dysfunction, right   [place], DO   1 month ago Painful hematuria   [employer], PA-C   5 months ago Gastroenteritis due to norovirus   [place], Devonne Doughty, [place]

## 2022-08-15 ENCOUNTER — Encounter: Payer: Self-pay | Admitting: Family Medicine

## 2022-08-15 ENCOUNTER — Other Ambulatory Visit: Payer: Self-pay | Admitting: Family Medicine

## 2022-08-15 DIAGNOSIS — N390 Urinary tract infection, site not specified: Secondary | ICD-10-CM

## 2022-08-15 DIAGNOSIS — R319 Hematuria, unspecified: Secondary | ICD-10-CM

## 2022-08-15 DIAGNOSIS — J410 Simple chronic bronchitis: Secondary | ICD-10-CM

## 2022-08-15 MED ORDER — TRELEGY ELLIPTA 100-62.5-25 MCG/ACT IN AEPB: 1.0000 | INHALATION_SPRAY | Freq: Every day | RESPIRATORY_TRACT | 3 refills | Status: DC

## 2022-08-21 NOTE — Addendum Note (Signed)
Addended by: Olin Hauser on: 08/21/2022 05:19 PM   Modules accepted: Orders

## 2022-08-30 ENCOUNTER — Ambulatory Visit (INDEPENDENT_AMBULATORY_CARE_PROVIDER_SITE_OTHER): Payer: Medicare Other | Admitting: Urology

## 2022-08-30 ENCOUNTER — Encounter: Payer: Self-pay | Admitting: Urology

## 2022-08-30 VITALS — BP 162/88 | HR 97 | Ht 67.0 in | Wt 257.0 lb

## 2022-08-30 DIAGNOSIS — R319 Hematuria, unspecified: Secondary | ICD-10-CM | POA: Diagnosis not present

## 2022-08-30 DIAGNOSIS — Z720 Tobacco use: Secondary | ICD-10-CM | POA: Diagnosis not present

## 2022-08-30 DIAGNOSIS — Z87440 Personal history of urinary (tract) infections: Secondary | ICD-10-CM

## 2022-08-30 DIAGNOSIS — N3946 Mixed incontinence: Secondary | ICD-10-CM

## 2022-08-30 DIAGNOSIS — R310 Gross hematuria: Secondary | ICD-10-CM | POA: Diagnosis not present

## 2022-08-30 DIAGNOSIS — N390 Urinary tract infection, site not specified: Secondary | ICD-10-CM

## 2022-08-30 LAB — MICROSCOPIC EXAMINATION: Bacteria, UA: NONE SEEN

## 2022-08-30 LAB — URINALYSIS, COMPLETE
Bilirubin, UA: NEGATIVE
Glucose, UA: NEGATIVE
Ketones, UA: NEGATIVE
Leukocytes,UA: NEGATIVE
Nitrite, UA: NEGATIVE
Protein,UA: NEGATIVE
RBC, UA: NEGATIVE
Specific Gravity, UA: 1.01 (ref 1.005–1.030)
Urobilinogen, Ur: 0.2 mg/dL (ref 0.2–1.0)
pH, UA: 5.5 (ref 5.0–7.5)

## 2022-08-30 NOTE — Progress Notes (Signed)
08/30/2022 2:40 PM   Amanda Webb 07/21/1971 [id number]  Referring provider: Olin Hauser, DO [address] [place],  [place] [address]  Chief Complaint  Patient presents with   Recurrent UTI    HPI: 51 year old established patient of Dr. Vikki Ports who presents today for further evaluation of urinary issues.  She last saw Dr. Lorra Hals n 04/2020 with worsening high-volume incontinence, primarily urge.  At that point time, she was soaking 3 pads per day.  Exam demonstrated hypermobility of the bladder neck and a positive cough test.  At that time she was referred for urodynamics.  She reports that she had an incident in early July, 06/23/2022 where she had acute onset extreme pain in the urethra with urination, frequency, and relatively bloody urine.  She is planned to leave the next day for [place].  [facility] had [facility] point-of-care urinalysis which was frankly positive, 3+ blood and 3+ leukocytes.  She was given what sounds like IM ceftriaxone at the time started on p.o. antibiotics.  By the next day, her symptoms had nearly completely resolved.  She was called 3 days later and told that her urine culture was negative that she could stop the antibiotics.  She did up completing about a 5-day course at that time.  More recently, she was seen by her primary care physician on 08/13/2022, Dr. Raliegh Ip.  She was noted to have dysuria, hematuria and a low-grade temp to 100.4.  Symptoms were not nearly as severe as the above episode but still concerning.  She actually ended up taking some of her leftover Bactrim that she had been given 2 days before seeing Dr. Raliegh Ip.  This culture was also negative.  Today, she is doing fine.  She has not had any further blood or dysuria.  She is a smoker.  She does have baseline incontinence as outlined above.   PMH: Past Medical History:  Diagnosis Date   Allergy    Anemia    Anxiety    Arrhythmia    Arthritis    Cervical  dysplasia    Cystitis    Depression    Dyspnea    GERD (gastroesophageal reflux disease)    Gross hematuria    Headache    Heart murmur    asymptomatic   History of methicillin resistant staphylococcus aureus (MRSA) 2013   HLD (hyperlipidemia)    Hypertension    Hypothyroid    Lymphedema    Palpitations    Pernicious anemia    Tobacco abuse    Type 2 diabetes mellitus ([place])     Surgical History: Past Surgical History:  Procedure Laterality Date   CARPAL TUNNEL RELEASE Bilateral 2011   FOOT SURGERY Right 2013   Plantar fascia   KNEE ARTHROSCOPY WITH MEDIAL MENISECTOMY Right 07/11/2021   Procedure: Right knee arthroscopic partial medial meniscectomy;  Surgeon: Leim Fabry, MD;  Location: [place];  Service: Orthopedics;  Laterality: Right;   ROBOTIC ASSISTED LAPAROSCOPIC CHOLECYSTECTOMY  07/15/2020   Dr Christian Mate   TONSILLECTOMY  1979   with ear tubes    Home Medications:  Allergies as of 08/30/2022       Reactions   Bee Venom Anaphylaxis, Hives, Swelling   Carries Epi pen.   Cat Hair Extract Itching, Other (See Comments), Swelling   Allergic to trees, nuts, wheat, grass, cats & dogs - itchy watery eyes, swelling. Uses Zyrtec & Flonase & Benadryl if really bad. Used to get  allergy shots. Allergic to trees, nuts, wheat, grass, cats & dogs - itchy watery eyes, swelling. Uses Zyrtec & Flonase & Benadryl if really bad. Used to get allergy shots.   Dog Epithelium Cough, Shortness Of Breath   Dog Epithelium Allergy Skin Test Shortness Of Breath   Dog Fennel Cough, Shortness Of Breath   Dog Fennel Allergy Skin Test Shortness Of Breath   Dust Mite Extract Cough, Shortness Of Breath   Tetracyclines & Related Nausea And Vomiting   Lactose Diarrhea, Nausea And Vomiting   Milk (cow) Diarrhea, Nausea And Vomiting   Milk Protein Diarrhea, Nausea And Vomiting   Milk-related Compounds Diarrhea, Nausea And Vomiting   Tape Other (See Comments), Rash   Needs to use paper  tape. Breaks out with severe rash, pulls skin off when using adhesive.   Wound Dressing Adhesive Rash   Other reaction(s): Other Needs to use paper tape. Breaks out with severe rash, pulls skin off when using adhesive. Needs to use paper tape. Breaks out with severe rash, pulls skin off when using adhesive. Needs to use paper tape. Breaks out with severe rash, pulls skin off when using adhesive.        Medication List        Accurate as of August 30, 2022  2:40 PM. If you have any questions, ask your nurse or doctor.          STOP taking these medications    ondansetron 4 MG disintegrating tablet Commonly known as: ZOFRAN-ODT Stopped by: Hollice Espy, MD   oxybutynin 5 MG 24 hr tablet Commonly known as: DITROPAN-XL Stopped by: Hollice Espy, MD   oxyCODONE 5 MG immediate release tablet Commonly known as: Oxy IR/ROXICODONE Stopped by: Hollice Espy, MD   predniSONE 20 MG tablet Commonly known as: DELTASONE Stopped by: Hollice Espy, MD       TAKE these medications    Accu-Chek FastClix Lancets Misc USE TO CHECK SUGAR UP TO TWICE DAILY AS DIRECTED   Accu-Chek Guide test strip Generic drug: glucose blood CHECK BLOOD SUGAR UP TO EVERY DAY AS DIRECTED   albuterol 108 (90 Base) MCG/ACT inhaler Commonly known as: VENTOLIN HFA INHALE 1 TO 2 PUFFS BY MOUTH EVERY 4 TO 6 HOURS AS NEEDED FOR SHORTNESS OF BREATH   ARIPiprazole 2 MG tablet Commonly known as: ABILIFY Take 2 mg by mouth every morning.   atenolol 50 MG tablet Commonly known as: TENORMIN TAKE 1 TABLET(50 MG) BY MOUTH DAILY   azelastine 0.1 % nasal spray Commonly known as: ASTELIN USE 1 SPRAY IN EACH NOSTRIL TWICE DAILY AS DIRECTED   B-12 COMPLIANCE INJECTION IJ Inject 1 Dose as directed every 30 (thirty) days.   Cetirizine HCl 10 MG Caps Take 10 mg by mouth daily with lunch.   fluticasone 50 MCG/ACT nasal spray Commonly known as: FLONASE SHAKE LIQUID AND USE 2 SPRAYS IN EACH NOSTRIL  EVERY DAY   fluvoxaMINE 100 MG tablet Commonly known as: LUVOX Take 150 mg by mouth every morning.   gabapentin 100 MG capsule Commonly known as: NEURONTIN Take by mouth. What changed: Another medication with the same name was removed. Continue taking this medication, and follow the directions you see here. Changed by: Hollice Espy, MD   ketoconazole 2 % shampoo Commonly known as: NIZORAL APPLY EXTERNALLY 2 TIMES A WEEK   lamoTRIgine 200 MG tablet Commonly known as: LAMICTAL Take by mouth. What changed: Another medication with the same name was removed. Continue taking this medication, and follow the  directions you see here. Changed by: Hollice Espy, MD   levothyroxine 50 MCG tablet Commonly known as: SYNTHROID TAKE 1 TABLET(50 MCG) BY MOUTH DAILY BEFORE AND BREAKFAST   multivitamin with minerals Tabs tablet Take 1 tablet by mouth daily.   nystatin cream Commonly known as: MYCOSTATIN Apply 1 application. topically 2 (two) times daily as needed (rash).   Ozempic (1 MG/DOSE) 4 MG/3ML Sopn Generic drug: Semaglutide (1 MG/DOSE) Inject 1 mg into the skin once a week.   pantoprazole 20 MG tablet Commonly known as: PROTONIX TAKE 1 TABLET(20 MG) BY MOUTH DAILY   tamsulosin 0.4 MG Caps capsule Commonly known as: FLOMAX Take 1 capsule (0.4 mg total) by mouth daily. For 2 weeks for kidney stone   Trelegy Ellipta 100-62.5-25 MCG/ACT Aepb Generic drug: Fluticasone-Umeclidin-Vilant Inhale 1 puff into the lungs daily.        Allergies:  Allergies  Allergen Reactions   Bee Venom Anaphylaxis, Hives and Swelling    Carries Epi pen.    Cat Hair Extract Itching, Other (See Comments) and Swelling    Allergic to trees, nuts, wheat, grass, cats & dogs - itchy watery eyes, swelling. Uses Zyrtec & Flonase & Benadryl if really bad. Used to get allergy shots. Allergic to trees, nuts, wheat, grass, cats & dogs - itchy watery eyes, swelling. Uses Zyrtec & Flonase & Benadryl if  really bad. Used to get allergy shots.   Dog Epithelium Cough and Shortness Of Breath   Dog Epithelium Allergy Skin Test Shortness Of Breath   Dog Fennel Cough and Shortness Of Breath   Dog Fennel Allergy Skin Test Shortness Of Breath   Dust Mite Extract Cough and Shortness Of Breath   Tetracyclines & Related Nausea And Vomiting   Lactose Diarrhea and Nausea And Vomiting   Milk (Cow) Diarrhea and Nausea And Vomiting   Milk Protein Diarrhea and Nausea And Vomiting   Milk-Related Compounds Diarrhea and Nausea And Vomiting   Tape Other (See Comments) and Rash    Needs to use paper tape. Breaks out with severe rash, pulls skin off when using adhesive.   Wound Dressing Adhesive Rash    Other reaction(s): Other Needs to use paper tape. Breaks out with severe rash, pulls skin off when using adhesive.  Needs to use paper tape. Breaks out with severe rash, pulls skin off when using adhesive. Needs to use paper tape. Breaks out with severe rash, pulls skin off when using adhesive.    Family History: Family History  Problem Relation Age of Onset   Depression Mother    Mental illness Mother    Alcohol abuse Mother    Lung cancer Mother    Cancer Maternal Grandmother    Diabetes Maternal Grandmother    Ovarian cancer Maternal Grandmother    Kidney disease Maternal Grandfather    Diabetes Father    Mental illness Father    Depression Father    Drug abuse Father    Depression Paternal Grandfather    Drug abuse Paternal Grandfather    Bladder Cancer Neg Hx    Breast cancer Neg Hx     Social History:  reports that she has been smoking cigarettes. She has a 32.00 pack-year smoking history. She has never used smokeless tobacco. She reports current alcohol use of about 3.0 standard drinks of alcohol per week. She reports that she does not use drugs.   Physical Exam: BP (!) 162/88   Pulse 97   Ht '5\' 7"'$  (1.702 m)  Wt 257 lb (116.6 kg)   BMI 40.25 kg/m   Constitutional:  Alert and  oriented, No acute distress. HEENT: [place] AT, moist mucus membranes.  Trachea midline, no masses. Cardiovascular: No clubbing, cyanosis, or edema. Respiratory: Normal respiratory effort, no increased work of breathing. Skin: No rashes, bruises or suspicious lesions. Neurologic: Grossly intact, no focal deficits, moving all 4 extremities. Psychiatric: Normal mood and affect.  Laboratory Data: Lab Results  Component Value Date   WBC 10.3 06/13/2022   HGB 14.8 06/13/2022   HCT 43.3 06/13/2022   MCV 93.9 06/13/2022   PLT 247 06/13/2022    Lab Results  Component Value Date   CREATININE 0.64 11/08/2020    Lab Results  Component Value Date   HGBA1C 6.9 (A) 12/19/2021    Urinalysis UA today is negative, no gross or microscopic blood   Pertinent Imaging: Results for orders placed during the hospital encounter of 08/13/22  DG Abd 1 View  Narrative CLINICAL DATA:  Dysuria, hematuria  EXAM: ABDOMEN - 1 VIEW  COMPARISON:  CT scan of October 07, 2015.  FINDINGS: The bowel gas pattern is normal. No definite nephrolithiasis is noted. Phleboliths are noted in the pelvis.  IMPRESSION: No definite nephrolithiasis.  No abnormal bowel dilatation.   Electronically Signed By: Marijo Conception M.D. On: 08/13/2022 11:50 KUB is personally reviewed, agree with radiologic interpretation  Assessment & Plan:    1. Gross hematuria See below, urinalysis today is completely clear without any ongoing gross or microscopic hematuria which is reassuring - Urinalysis, Complete  2. Recurrent UTI Based on her clinical history, point-of-care urinalysis strongly suspect her episode in July was in fact related to an episode of acute cystitis.  Unclear why her culture did not grow anything but certainly this can happen.  On a subsequent occasion, symptoms again were consistent with UTI, mildly suspicious urine however the culture did not grow anything again but she had taken antibiotics before  leaving this culture which explains this phenomenon.  Reassuringly today, she had no further episodes, she is asymptomatic and her urinalysis is negative.  I do not feel that this warrants hematuria evaluation at this point time but low threshold to pursue this especially given her smoking history, see below.  3. Urinary incontinence, mixed Does not desire any intervention, if she does, would strongly recommend urodynamics as previously recommended  4. Tobacco abuse We discussed the causative relationship tween bladder cancer and smoking.  I encouraged her to stop.  We discussed some barriers keeping her from doing so and strategies to overcome these barriers.   Return if symptoms worsen or fail to improve.  Hollice Espy, MD  [facility] [address] [place], [place] [address] [phone]

## 2022-08-31 ENCOUNTER — Encounter: Payer: Self-pay | Admitting: Family Medicine

## 2022-08-31 ENCOUNTER — Other Ambulatory Visit: Payer: Self-pay

## 2022-08-31 DIAGNOSIS — L700 Acne vulgaris: Secondary | ICD-10-CM

## 2022-08-31 MED ORDER — TRETINOIN 0.025 % EX CREA: TOPICAL_CREAM | CUTANEOUS | 2 refills | Status: AC

## 2022-09-06 ENCOUNTER — Encounter: Payer: Self-pay | Admitting: Family Medicine

## 2022-09-06 DIAGNOSIS — H40053 Ocular hypertension, bilateral: Secondary | ICD-10-CM | POA: Diagnosis not present

## 2022-09-06 DIAGNOSIS — E119 Type 2 diabetes mellitus without complications: Secondary | ICD-10-CM | POA: Diagnosis not present

## 2022-09-06 DIAGNOSIS — H16223 Keratoconjunctivitis sicca, not specified as Sjogren's, bilateral: Secondary | ICD-10-CM | POA: Diagnosis not present

## 2022-09-06 DIAGNOSIS — I10 Essential (primary) hypertension: Secondary | ICD-10-CM | POA: Diagnosis not present

## 2022-09-06 LAB — HM DIABETES EYE EXAM

## 2022-09-07 ENCOUNTER — Other Ambulatory Visit: Payer: Self-pay | Admitting: Family Medicine

## 2022-09-07 ENCOUNTER — Ambulatory Visit: Payer: Medicare Other

## 2022-09-07 DIAGNOSIS — N200 Calculus of kidney: Secondary | ICD-10-CM

## 2022-09-07 DIAGNOSIS — E039 Hypothyroidism, unspecified: Secondary | ICD-10-CM

## 2022-09-07 NOTE — Telephone Encounter (Signed)
Requested medication (s) are due for refill today: yes  Requested medication (s) are on the active medication list: yes  Last refill:  Synthroid 02/13/22 #90 with 1 RF, Flomax 08/13/22 #30 with 0  Future visit scheduled: 09/19/22, seen 08/13/22  Notes to clinic:  Failed protocol of labs within 12 months, labs from 11/08/2020 for Synthroid, was not sure Flomax was to continue, ordered for 2 wks for a kidney stone, has upcoming appt, please assess.       Requested Prescriptions  Pending Prescriptions Disp Refills   levothyroxine (SYNTHROID) 50 MCG tablet [Pharmacy Med Name: LEVOTHYROXINE 0.'05MG'$  (50MCG) TAB] 90 tablet 1    Sig: TAKE 1 TABLET(50 MCG) BY MOUTH DAILY BEFORE BREAKFAST     Endocrinology:  Hypothyroid Agents Failed - 09/07/2022  6:27 AM      Failed - TSH in normal range and within 360 days    TSH  Date Value Ref Range Status  11/08/2020 2.28 mIU/L Final    Comment:              Reference Range .           > or = 20 Years  0.40-4.50 .                Pregnancy Ranges           First trimester    0.26-2.66           Second trimester   0.55-2.73           Third trimester    0.43-2.91          Passed - Valid encounter within last 12 months    Recent Outpatient Visits           3 weeks ago Acute cystitis with hematuria   [place], DO   1 month ago Eustachian tube dysfunction, right   [place], DO   1 month ago Eustachian tube dysfunction, right   [place], DO   2 months ago Painful hematuria   [place], PA-C   6 months ago Gastroenteritis due to norovirus   [place], Devonne Doughty, DO       Future Appointments             Today  [facility], [place]   In 1 week [place], [facility], [facility]             tamsulosin (FLOMAX)  0.4 MG CAPS capsule [Pharmacy Med Name: TAMSULOSIN 0.'4MG'$  CAPSULES] 90 capsule     Sig: TAKE 1 CAPSULE(0.4 MG) BY MOUTH DAILY FOR 2 WEEKS FOR KIDNEY STONE     Urology: Alpha-Adrenergic Blocker Failed - 09/07/2022  6:27 AM      Failed - PSA in normal range and within 360 days    No results found for: "LABPSA", "PSA", "PSA1", "ULTRAPSA"       Failed - Last BP in normal range    BP Readings from Last 1 Encounters:  08/30/22 (!) 162/88         Passed - Valid encounter within last 12 months    Recent Outpatient Visits           3 weeks ago Acute cystitis with hematuria   [place], DO   1 month ago Eustachian tube dysfunction,  right   [place], DO   1 month ago Eustachian tube dysfunction, right   [place], DO   2 months ago Painful hematuria   [place], PA-C   6 months ago Gastroenteritis due to norovirus   [place], DO       Future Appointments             Today  [facility], [place]   In 1 week [place], [facility], [facility]

## 2022-09-13 ENCOUNTER — Inpatient Hospital Stay: Payer: Medicare Other | Attending: Oncology

## 2022-09-13 DIAGNOSIS — E538 Deficiency of other specified B group vitamins: Secondary | ICD-10-CM | POA: Diagnosis not present

## 2022-09-13 DIAGNOSIS — D509 Iron deficiency anemia, unspecified: Secondary | ICD-10-CM

## 2022-09-13 MED ORDER — CYANOCOBALAMIN 1000 MCG/ML IJ SOLN
1000.0000 ug | Freq: Once | INTRAMUSCULAR | Status: AC
  Administered 2022-09-13: 1000 ug via INTRAMUSCULAR
  Filled 2022-09-13: qty 1

## 2022-09-14 ENCOUNTER — Encounter: Payer: Self-pay | Admitting: Obstetrics and Gynecology

## 2022-09-17 ENCOUNTER — Ambulatory Visit: Payer: Medicare Other

## 2022-09-17 ENCOUNTER — Ambulatory Visit (INDEPENDENT_AMBULATORY_CARE_PROVIDER_SITE_OTHER): Payer: Medicare Other | Admitting: Obstetrics and Gynecology

## 2022-09-17 VITALS — BP 144/80 | HR 78 | Wt 258.0 lb

## 2022-09-17 DIAGNOSIS — Z3042 Encounter for surveillance of injectable contraceptive: Secondary | ICD-10-CM | POA: Diagnosis not present

## 2022-09-17 MED ORDER — MEDROXYPROGESTERONE ACETATE 150 MG/ML IM SUSP
150.0000 mg | Freq: Once | INTRAMUSCULAR | Status: AC
  Administered 2022-09-17: 150 mg via INTRAMUSCULAR

## 2022-09-17 NOTE — Progress Notes (Addendum)
Date last pap: 09/27/2020. Last Depo-Provera: 07/02/2022 Side Effects if any: None. Serum HCG indicated? N/A. Depo-Provera 150 mg IM given by: Otelia Limes, CMA  Next appointment due: December 18- January 1

## 2022-09-19 ENCOUNTER — Ambulatory Visit (INDEPENDENT_AMBULATORY_CARE_PROVIDER_SITE_OTHER): Payer: Medicare Other

## 2022-09-19 DIAGNOSIS — Z23 Encounter for immunization: Secondary | ICD-10-CM

## 2022-09-23 ENCOUNTER — Encounter: Payer: Self-pay | Admitting: Family Medicine

## 2022-09-24 ENCOUNTER — Other Ambulatory Visit: Payer: Self-pay

## 2022-09-24 DIAGNOSIS — L219 Seborrheic dermatitis, unspecified: Secondary | ICD-10-CM

## 2022-09-24 MED ORDER — KETOCONAZOLE 2 % EX SHAM: MEDICATED_SHAMPOO | CUTANEOUS | 2 refills | Status: DC

## 2022-10-07 ENCOUNTER — Other Ambulatory Visit: Payer: Self-pay | Admitting: Family Medicine

## 2022-10-07 DIAGNOSIS — I10 Essential (primary) hypertension: Secondary | ICD-10-CM

## 2022-10-08 ENCOUNTER — Encounter: Payer: Self-pay | Admitting: Family Medicine

## 2022-10-09 NOTE — Telephone Encounter (Signed)
Requested Prescriptions  Pending Prescriptions Disp Refills  . atenolol (TENORMIN) 50 MG tablet [Pharmacy Med Name: ATENOLOL '50MG'$  TABLETS] 90 tablet 0    Sig: TAKE 1 TABLET(50 MG) BY MOUTH DAILY     Cardiovascular: Beta Blockers 2 Failed - 10/07/2022  6:27 AM      Failed - Cr in normal range and within 360 days    Creat  Date Value Ref Range Status  11/08/2020 0.64 0.50 - 1.10 mg/dL Final         Failed - Last BP in normal range    BP Readings from Last 1 Encounters:  09/17/22 (!) 144/80         Passed - Last Heart Rate in normal range    Pulse Readings from Last 1 Encounters:  09/17/22 78         Passed - Valid encounter within last 6 months    Recent Outpatient Visits          1 month ago Acute cystitis with hematuria   [place], DO   2 months ago Eustachian tube dysfunction, right   [place], DO   2 months ago Eustachian tube dysfunction, right   [place], DO   3 months ago Painful hematuria   [place], PA-C   7 months ago Gastroenteritis due to norovirus   [place], [place]

## 2022-10-12 ENCOUNTER — Inpatient Hospital Stay: Payer: Medicare Other | Attending: Oncology

## 2022-10-12 DIAGNOSIS — E538 Deficiency of other specified B group vitamins: Secondary | ICD-10-CM | POA: Diagnosis not present

## 2022-10-12 DIAGNOSIS — D509 Iron deficiency anemia, unspecified: Secondary | ICD-10-CM

## 2022-10-12 MED ORDER — CYANOCOBALAMIN 1000 MCG/ML IJ SOLN
1000.0000 ug | Freq: Once | INTRAMUSCULAR | Status: AC
  Administered 2022-10-12: 1000 ug via INTRAMUSCULAR

## 2022-10-15 ENCOUNTER — Encounter (INDEPENDENT_AMBULATORY_CARE_PROVIDER_SITE_OTHER): Payer: Self-pay

## 2022-10-16 ENCOUNTER — Encounter: Payer: Self-pay | Admitting: Family Medicine

## 2022-10-19 ENCOUNTER — Encounter: Payer: Self-pay | Admitting: Family Medicine

## 2022-10-19 ENCOUNTER — Ambulatory Visit (INDEPENDENT_AMBULATORY_CARE_PROVIDER_SITE_OTHER): Payer: Medicare Other | Admitting: Family Medicine

## 2022-10-19 VITALS — BP 131/74 | HR 91 | Ht 67.0 in | Wt 260.0 lb

## 2022-10-19 DIAGNOSIS — Z6841 Body Mass Index (BMI) 40.0 and over, adult: Secondary | ICD-10-CM

## 2022-10-19 DIAGNOSIS — Z1211 Encounter for screening for malignant neoplasm of colon: Secondary | ICD-10-CM | POA: Diagnosis not present

## 2022-10-19 DIAGNOSIS — E1169 Type 2 diabetes mellitus with other specified complication: Secondary | ICD-10-CM

## 2022-10-19 LAB — POCT GLYCOSYLATED HEMOGLOBIN (HGB A1C): Hemoglobin A1C: 6.6 % — AB (ref 4.0–5.6)

## 2022-10-19 NOTE — Assessment & Plan Note (Signed)
Well-controlled DM with A1c 6.6, improved from 6.9 on GLP1 Complications - obesity, GERD, hyperlipidemia.  Plan:  1. Continue current therapy - Ozempic, now dose inc from 0.5 up to '1mg'$  weekly inj 2. Encourage improved lifestyle - low carb, low sugar diet, reduce portion size, continue improving regular exercise 3. Check CBG , bring log to next visit for review  DM Foot updates / DIABETES Urine Microalbumin test today Updated DIABETES Eye  F/u 4 month repeat A1c

## 2022-10-19 NOTE — Patient Instructions (Addendum)
Thank you for coming to the office today.  I agree with dose increase Ozempic from 0.53m up to 19mweekly injection.  Encourage to improve the lifestyle overall with diet and exercise in addition to the medication.  ----------------------------  Ordered the Cologuard (home kit) test for colon cancer screening. Stay tuned for further updates.  It will be shipped to you directly. If not received in 2-4 weeks, call [employer] the company.   If you send it back and no results are received in 2-4 weeks, call [employer] company as well!   Colon Cancer Screening: - For all adults age 50+outine colon cancer screening is highly recommended.     - Recent guidelines from [employer] starting age of 50 Early detection of colon cancer is important, because often there are no warning signs or symptoms, also if found early usually it can be cured. Late stage is hard to treat.   - If Cologuard is NEGATIVE, then it is good for 3 years before next due - If Cologuard is POSITIVE, then it is strongly advised to get a Colonoscopy, which allows the GI doctor to locate the source of the cancer or polyp (even very early stage) and treat it by removing it. ------------------------- Follow instructions to collect sample, you may call the company for any help or questions, 24/7 telephone support at [phone]    Please schedule a Follow-up Appointment to: Return in about 4 months (around 02/17/2023) for 4 month follow-up DM A1c, Morbid Obesity.  If you have any other questions or concerns, please feel free to call the office or send a message through [facility] may also schedule an earlier appointment if necessary.  Additionally, you may be receiving a survey about your experience at our office within a few days to 1 week by e-mail or mail. We value your feedback.  AlNobie PutnamDO [facility]

## 2022-10-19 NOTE — Progress Notes (Signed)
Subjective:    Patient ID: Amanda Webb, female    DOB: 05-07-1971, 51 y.o.   MRN: [id number]  Amanda Webb is a 51 y.o. female presenting on 10/19/2022 for Diabetes   HPI  Type 2 Diabetes Morbid Obesity BMI >40  Last A1c 6.9 (12/2021) On Ozempic 0.25 to 0.'5mg'$  weekly since Jan 2023 Checking sugars regularly Admits some improvement on Ozempic with reduced appetite, but admits still sedentary, has not lost much weight instead she gained a few lbs.  Current A1c today down to 6.6 (10/19/22)  Concerns about central adiposity and BP. Her boyfriend is encouraging her to dose increase. But she has been scared to dose increase Ozempic 0.'25mg'$  up to 0.5 and '1mg'$ , she has the '1mg'$   Admits depression recurrent can contribute to her weight and lifestyle  Denies hypoglycemia, polyuria, visual changes, numbness or tingling.      10/19/2022    3:27 PM 08/13/2022   11:18 AM 07/23/2022   10:23 AM  Depression screen PHQ 2/9  Decreased Interest '2 2 1  '$ Down, Depressed, Hopeless '3 1 1  '$ PHQ - 2 Score '5 3 2  '$ Altered sleeping '3 3 1  '$ Tired, decreased energy '2 2 1  '$ Change in appetite '2 1 1  '$ Feeling bad or failure about yourself  '2 1 1  '$ Trouble concentrating 1 0 1  Moving slowly or fidgety/restless 0 0 1  Suicidal thoughts 0 0 0  PHQ-9 Score '15 10 8  '$ Difficult doing work/chores Not difficult at all Somewhat difficult Somewhat difficult    Social History   Tobacco Use   Smoking status: Every Day    Packs/day: 1.00    Years: 32.00    Total pack years: 32.00    Types: Cigarettes   Smokeless tobacco: Never   Tobacco comments:    1 ppd/ 11/22/2020  Vaping Use   Vaping Use: Never used  Substance Use Topics   Alcohol use: Yes    Alcohol/week: 3.0 standard drinks of alcohol    Types: 3 Glasses of wine per week   Drug use: No    Review of Systems Per HPI unless specifically indicated above     Objective:    BP 131/74   Pulse 91   Ht '5\' 7"'$  (1.702 m)   Wt 260 lb (117.9 kg)   SpO2  96%   BMI 40.72 kg/m   Wt Readings from Last 3 Encounters:  10/19/22 260 lb (117.9 kg)  09/17/22 258 lb (117 kg)  08/30/22 257 lb (116.6 kg)    Physical Exam Vitals and nursing note reviewed.  Constitutional:      General: She is not in acute distress.    Appearance: She is well-developed. She is obese. She is not diaphoretic.     Comments: Well-appearing, comfortable, cooperative  HENT:     Head: Normocephalic and atraumatic.  Eyes:     General:        Right eye: No discharge.        Left eye: No discharge.     Conjunctiva/sclera: Conjunctivae normal.  Neck:     Thyroid: No thyromegaly.  Cardiovascular:     Rate and Rhythm: Normal rate and regular rhythm.     Heart sounds: Normal heart sounds. No murmur heard. Pulmonary:     Effort: Pulmonary effort is normal. No respiratory distress.     Breath sounds: Normal breath sounds. No wheezing or rales.  Musculoskeletal:        General: Normal range  of motion.     Cervical back: Normal range of motion and neck supple.  Lymphadenopathy:     Cervical: No cervical adenopathy.  Skin:    General: Skin is warm and dry.     Findings: No erythema or rash.  Neurological:     Mental Status: She is alert and oriented to person, place, and time.  Psychiatric:        Behavior: Behavior normal.     Comments: Well groomed, good eye contact, normal speech and thoughts     Diabetic Foot Exam - Simple   Simple Foot Form Diabetic Foot exam was performed with the following findings: Yes 10/19/2022  3:53 PM  Visual Inspection No deformities, no ulcerations, no other skin breakdown bilaterally: Yes Sensation Testing Intact to touch and monofilament testing bilaterally: Yes Pulse Check Posterior Tibialis and Dorsalis pulse intact bilaterally: Yes Comments      Results for orders placed or performed in visit on 10/19/22  POCT HgB A1C  Result Value Ref Range   Hemoglobin A1C 6.6 (A) 4.0 - 5.6 %      Assessment & Plan:   Problem  List Items Addressed This Visit     Type 2 diabetes mellitus with other specified complication ([place]) - Primary    Well-controlled DM with A1c 6.6, improved from 6.9 on GLP1 Complications - obesity, GERD, hyperlipidemia.  Plan:  1. Continue current therapy - Ozempic, now dose inc from 0.5 up to '1mg'$  weekly inj 2. Encourage improved lifestyle - low carb, low sugar diet, reduce portion size, continue improving regular exercise 3. Check CBG , bring log to next visit for review  DM Foot updates / DIABETES Urine Microalbumin test today Updated DIABETES Eye  F/u 4 month repeat A1c      Relevant Orders   POCT HgB A1C (Completed)   Urine Microalbumin w/creat. ratio   Other Visit Diagnoses     Morbid obesity with BMI of 40.0-44.9, adult ([place])       Screening for colon cancer       Relevant Orders   Cologuard      Morbid obesity Counseling today on lifestyle modification diet exercise and GLP1 therapy as planned to dose increase.   Due for routine colon cancer screening. Never had colonoscopy (not interested), no family history colon cancer. - Discussion today about recommendations for either Colonoscopy or Cologuard screening, benefits and risks of screening, interested in Cologuard, understands that if positive then recommendation is for diagnostic colonoscopy to follow-up. - Ordered Cologuard today  No orders of the defined types were placed in this encounter.     Follow up plan: Return in about 4 months (around 02/17/2023) for 4 month follow-up DM A1c, Morbid Obesity.   [name], [facility] 10/19/2022, 3:32 PM

## 2022-10-20 LAB — MICROALBUMIN / CREATININE URINE RATIO
Creatinine, Urine: 24 mg/dL (ref 20–275)
Microalb Creat Ratio: 29 mcg/mg creat (ref <30)
Microalb, Ur: 0.7 mg/dL

## 2022-10-22 ENCOUNTER — Ambulatory Visit: Payer: Medicare Other | Admitting: Family Medicine

## 2022-10-23 ENCOUNTER — Other Ambulatory Visit: Payer: Self-pay | Admitting: Family Medicine

## 2022-10-23 DIAGNOSIS — E1169 Type 2 diabetes mellitus with other specified complication: Secondary | ICD-10-CM

## 2022-10-24 NOTE — Telephone Encounter (Signed)
Requested Prescriptions  Pending Prescriptions Disp Refills   OZEMPIC, 1 MG/DOSE, 4 MG/3ML SOPN [Pharmacy Med Name: OZEMPIC '1MG'$  PER DOSE (1X'4MG'$  PEN)] 9 mL 1    Sig: INJECT 1 MG UNDER THE SKIN ONE DAY A WEEK     Endocrinology:  Diabetes - GLP-1 Receptor Agonists - semaglutide Failed - 10/23/2022  6:13 PM      Failed - HBA1C in normal range and within 180 days    Hemoglobin A1C  Date Value Ref Range Status  10/19/2022 6.6 (A) 4.0 - 5.6 % Final   Hgb A1c MFr Bld  Date Value Ref Range Status  11/08/2020 6.6 (H) <5.7 % of total Hgb Final    Comment:    For someone without known diabetes, a hemoglobin A1c value of 6.5% or greater indicates that they may have  diabetes and this should be confirmed with a follow-up  test. . For someone with known diabetes, a value <7% indicates  that their diabetes is well controlled and a value  greater than or equal to 7% indicates suboptimal  control. A1c targets should be individualized based on  duration of diabetes, age, comorbid conditions, and  other considerations. . Currently, no consensus exists regarding use of hemoglobin A1c for diagnosis of diabetes for children. .          Failed - Cr in normal range and within 360 days    Creat  Date Value Ref Range Status  11/08/2020 0.64 0.50 - 1.10 mg/dL Final   Creatinine, Urine  Date Value Ref Range Status  10/19/2022 24 20 - 275 mg/dL Final         Passed - Valid encounter within last 6 months    Recent Outpatient Visits           5 days ago Type 2 diabetes mellitus with other specified complication, without long-term current use of insulin [facility])   [place], DO   2 months ago Acute cystitis with hematuria   [place], DO   3 months ago Eustachian tube dysfunction, right   [place], DO   3 months ago Eustachian tube dysfunction, right   [place], DO   3 months ago Painful hematuria   [facility] Mecum, Dani Gobble, [place]

## 2022-11-01 ENCOUNTER — Telehealth: Payer: Medicare Other | Admitting: Urgent Care

## 2022-11-01 ENCOUNTER — Encounter: Payer: Self-pay | Admitting: Family Medicine

## 2022-11-01 DIAGNOSIS — R051 Acute cough: Secondary | ICD-10-CM

## 2022-11-01 DIAGNOSIS — J22 Unspecified acute lower respiratory infection: Secondary | ICD-10-CM

## 2022-11-01 MED ORDER — ALBUTEROL SULFATE (2.5 MG/3ML) 0.083% IN NEBU: INHALATION_SOLUTION | RESPIRATORY_TRACT | 1 refills | Status: DC

## 2022-11-01 MED ORDER — AZITHROMYCIN 250 MG PO TABS: ORAL_TABLET | ORAL | 0 refills | Status: AC

## 2022-11-01 MED ORDER — PREDNISONE 50 MG PO TABS: 50.0000 mg | ORAL_TABLET | Freq: Every day | ORAL | 0 refills | Status: AC

## 2022-11-01 MED ORDER — AMOXICILLIN-POT CLAVULANATE 875-125 MG PO TABS: 1.0000 | ORAL_TABLET | Freq: Two times a day (BID) | ORAL | 0 refills | Status: AC

## 2022-11-01 NOTE — Progress Notes (Signed)
Virtual Visit Consent   Amanda Webb, you are scheduled for a virtual visit with a [facility] provider today. Just as with appointments in the office, your consent must be obtained to participate. Your consent will be active for this visit and any virtual visit you may have with one of our providers in the next 365 days. If you have a MyChart account, a copy of this consent can be sent to you electronically.  As this is a virtual visit, video technology does not allow for your provider to perform a traditional examination. This may limit your provider's ability to fully assess your condition. If your provider identifies any concerns that need to be evaluated in person or the need to arrange testing (such as labs, EKG, etc.), we will make arrangements to do so. Although advances in technology are sophisticated, we cannot ensure that it will always work on either your end or our end. If the connection with a video visit is poor, the visit may have to be switched to a telephone visit. With either a video or telephone visit, we are not always able to ensure that we have a secure connection.  By engaging in this virtual visit, you consent to the provision of healthcare and authorize for your insurance to be billed (if applicable) for the services provided during this visit. Depending on your insurance coverage, you may receive a charge related to this service.  I need to obtain your verbal consent now. Are you willing to proceed with your visit today? Amanda Webb has provided verbal consent on 11/01/2022 for a virtual visit (video or telephone). Amanda Malling, PA  Date: 11/01/2022 5:02 PM  Virtual Visit via Video Note   I, Amanda Webb, connected with  Amanda Webb  ([id number], 12/11/1971) on 11/01/22 at  5:00 PM EST by a video-enabled telemedicine application and verified that I am speaking with the correct person using two identifiers.  Location: Patient: Virtual Visit Location Patient:  Home Provider: Virtual Visit Location Provider: Home Office   I discussed the limitations of evaluation and management by telemedicine and the availability of in person appointments. The patient expressed understanding and agreed to proceed.    History of Present Illness: Amanda Webb is a 51 y.o. who is being seen today for cough.  HPI: Pleasant 51yo female with known hx of allergies, asthma, smoker, and hx of recurrent pneumonia and bronchitis presents today due to severe harsh cough since Monday night. States it is constant. Reports a "croupy sound", but states it is dry and is unable to get any mucous up despite Mucinex '600mg'$  BID. Also reports stuffy nose. Is able to blow out thick mucous in the morning, but it dries up as the day goes on. Continues to use her inhalers without relief to the tightness in chest or cough.Also taking antihistamines, flonase and astelin. Is not audibly hearing wheezing or high pitched sounds. Took a home covid test, negative. Pt denies fever, hemoptysis, CP, palps.    Problems:  Patient Active Problem List   Diagnosis Date Noted   Major depressive disorder, recurrent, in partial remission ([place]) 11/08/2020   Bronchitis 10/21/2020   Cough 10/21/2020   Acute non-recurrent sinusitis 08/24/2020   Status post laparoscopic cholecystectomy 07/28/2020   Morbid obesity ([place]) 07/05/2020   Hepatic steatosis 06/23/2020   Lymphedema 03/09/2020   Chronic venous insufficiency 03/09/2020   Bilateral lower extremity edema 02/16/2020   Female cystocele 10/21/2018   Urinary incontinence, mixed 10/21/2018  B12 deficiency 04/08/2018   Iron deficiency anemia 03/14/2018   GAD (generalized anxiety disorder) 03/10/2018   Acute right-sided low back pain 10/28/2017   Tobacco abuse 05/09/2017   Multiple thyroid nodules 12/28/2016   Simple chronic bronchitis ([place]) 12/13/2016   BMI 40.0-44.9, adult ([place]) 09/26/2016   HLD (hyperlipidemia) 09/26/2016   Vitamin D deficiency  09/26/2016   Type 2 diabetes mellitus with other specified complication ([place]) 28/31/5176   Candidiasis of skin and nail 02/21/2015   Candidal intertrigo 02/21/2015   Cellulitis of right lower extremity 02/18/2015   Benign cyst of right kidney 01/17/2015   Chronic sinusitis 11/15/2014   Pneumonia due to infectious organism 11/02/2014   Whiplash injury 09/03/2014   Heart palpitations 04/02/2014   Palpitations 04/02/2014   Allergic rhinitis 12/31/2013   Spondylosis of lumbar region without myelopathy or radiculopathy 12/28/2013   Chronic low back pain 09/28/2013   Other fatigue 09/28/2013   Hypothyroidism 09/28/2013   Alteration of body temperature 09/28/2013   Other fatigue 09/28/2013   Plantar fasciitis, bilateral 07/10/2013   Tarsal tunnel syndrome of right side 07/10/2013   Plantar fascial fibromatosis 07/10/2013   Encounter for surveillance of injectable contraceptive 05/13/2013   Diuresis excessive 03/24/2013   Polyuria 03/24/2013   Abnormal uterine and vaginal bleeding, unspecified 03/24/2013   Arthritis, multiple joint involvement 03/17/2013   Fibroblastic disorder 07/28/2012   Borderline personality disorder ([place]) 12/17/2010   Borderline personality disorder in adult [facility]) 12/17/2010   Essential hypertension 02/18/2010   Carpal tunnel syndrome on both sides 12/17/2006   Major depressive disorder, single episode 02/21/1998   Major depressive disorder, single episode 02/21/1998   Disk prolapse 04/17/1995   Depression with anxiety 03/18/1995   OCD (obsessive compulsive disorder) 03/18/1995   Severe anxiety with panic 03/18/1995    Allergies:  Allergies  Allergen Reactions   Bee Venom Anaphylaxis, Hives and Swelling    Carries Epi pen.    Cat Hair Extract Itching, Other (See Comments) and Swelling    Allergic to trees, nuts, wheat, grass, cats & dogs - itchy watery eyes, swelling. Uses Zyrtec & Flonase & Benadryl if really bad. Used to get allergy shots. Allergic to  trees, nuts, wheat, grass, cats & dogs - itchy watery eyes, swelling. Uses Zyrtec & Flonase & Benadryl if really bad. Used to get allergy shots.   Dog Epithelium Cough and Shortness Of Breath   Dog Epithelium Allergy Skin Test Shortness Of Breath   Dog Fennel Cough and Shortness Of Breath   Dog Fennel Allergy Skin Test Shortness Of Breath   Dust Mite Extract Cough and Shortness Of Breath   Tetracyclines & Related Nausea And Vomiting   Lactose Diarrhea and Nausea And Vomiting   Milk (Cow) Diarrhea and Nausea And Vomiting   Milk Protein Diarrhea and Nausea And Vomiting   Milk-Related Compounds Diarrhea and Nausea And Vomiting   Tape Other (See Comments) and Rash    Needs to use paper tape. Breaks out with severe rash, pulls skin off when using adhesive.   Wound Dressing Adhesive Rash    Other reaction(s): Other Needs to use paper tape. Breaks out with severe rash, pulls skin off when using adhesive.  Needs to use paper tape. Breaks out with severe rash, pulls skin off when using adhesive. Needs to use paper tape. Breaks out with severe rash, pulls skin off when using adhesive.   Medications:  Current Outpatient Medications:    albuterol (PROVENTIL) (2.5 MG/3ML) 0.083% nebulizer solution, Inhale 59m (one vial) via  nebulized route every four hours around the clock for 24 hours, followed by one vial every 6 hours for the next 24 hours. After 48 hours, take 42m via neb every 6-8 hours PRN cough, wheeze, Disp: 150 mL, Rfl: 1   amoxicillin-clavulanate (AUGMENTIN) 875-125 MG tablet, Take 1 tablet by mouth 2 (two) times daily for 5 days., Disp: 10 tablet, Rfl: 0   azithromycin (ZITHROMAX) 250 MG tablet, Take 2 tablets on day 1, then 1 tablet daily on days 2 through 5, Disp: 6 tablet, Rfl: 0   predniSONE (DELTASONE) 50 MG tablet, Take 1 tablet (50 mg total) by mouth daily with breakfast for 5 days., Disp: 5 tablet, Rfl: 0   Accu-Chek FastClix Lancets MISC, USE TO CHECK SUGAR UP TO TWICE DAILY AS  DIRECTED, Disp: 102 each, Rfl: 5   albuterol (VENTOLIN HFA) 108 (90 Base) MCG/ACT inhaler, INHALE 1 TO 2 PUFFS BY MOUTH EVERY 4 TO 6 HOURS AS NEEDED FOR SHORTNESS OF BREATH, Disp: 18 g, Rfl: 2   ARIPiprazole (ABILIFY) 2 MG tablet, Take 2 mg by mouth every morning. , Disp: , Rfl:    atenolol (TENORMIN) 50 MG tablet, TAKE 1 TABLET(50 MG) BY MOUTH DAILY, Disp: 90 tablet, Rfl: 0   azelastine (ASTELIN) 0.1 % nasal spray, USE 1 SPRAY IN EACH NOSTRIL TWICE DAILY AS DIRECTED, Disp: 30 mL, Rfl: 5   Cetirizine HCl 10 MG CAPS, Take 10 mg by mouth daily with lunch. , Disp: , Rfl:    Cyanocobalamin (B-12 COMPLIANCE INJECTION IJ), Inject 1 Dose as directed every 30 (thirty) days., Disp: , Rfl:    fluticasone (FLONASE) 50 MCG/ACT nasal spray, SHAKE LIQUID AND USE 2 SPRAYS IN EACH NOSTRIL EVERY DAY, Disp: 48 g, Rfl: 1   fluvoxaMINE (LUVOX) 100 MG tablet, Take 150 mg by mouth every morning. , Disp: , Rfl:    gabapentin (NEURONTIN) 100 MG capsule, Take by mouth., Disp: , Rfl:    glucose blood (ACCU-CHEK GUIDE) test strip, CHECK BLOOD SUGAR UP TO EVERY DAY AS DIRECTED, Disp: 200 strip, Rfl: 5   hydrOXYzine (VISTARIL) 25 MG capsule, Take 25 mg by mouth every 4 (four) hours as needed., Disp: , Rfl:    ketoconazole (NIZORAL) 2 % shampoo, APPLY EXTERNALLY 2 TIMES A WEEK, Disp: 120 mL, Rfl: 2   lamoTRIgine (LAMICTAL) 200 MG tablet, Take by mouth., Disp: , Rfl:    levothyroxine (SYNTHROID) 50 MCG tablet, TAKE 1 TABLET(50 MCG) BY MOUTH DAILY BEFORE BREAKFAST, Disp: 90 tablet, Rfl: 1   Multiple Vitamin (MULTIVITAMIN WITH MINERALS) TABS tablet, Take 1 tablet by mouth daily., Disp: , Rfl:    nystatin cream (MYCOSTATIN), Apply 1 application. topically 2 (two) times daily as needed (rash)., Disp: 30 g, Rfl: 5   OZEMPIC, 1 MG/DOSE, 4 MG/3ML SOPN, INJECT 1 MG UNDER THE SKIN ONE DAY A WEEK, Disp: 9 mL, Rfl: 1   pantoprazole (PROTONIX) 20 MG tablet, TAKE 1 TABLET(20 MG) BY MOUTH DAILY, Disp: 90 tablet, Rfl: 1   tamsulosin (FLOMAX)  0.4 MG CAPS capsule, Take 1 capsule (0.4 mg total) by mouth daily. For 2 weeks for kidney stone, Disp: 30 capsule, Rfl: 0   TRELEGY ELLIPTA 100-62.5-25 MCG/ACT AEPB, Inhale 1 puff into the lungs daily., Disp: 180 each, Rfl: 3   tretinoin (RETIN-A) 0.025 % cream, APPLY EXTERNALLY TO THE AFFECTED AREA AT BEDTIME AS NEEDED, Disp: 45 g, Rfl: 2  Current Facility-Administered Medications:    medroxyPROGESTERone (DEPO-PROVERA) injection 150 mg, 150 mg, Intramuscular, Q90 days, EHarlin Heys  MD, 150 mg at 01/29/22 0177  Observations/Objective: Patient is well-developed, well-nourished in no acute distress.  Resting at home, non-toxic in appearance. Pt does have a very frequent harsh dry cough during encounter  Head is normocephalic, atraumatic.  No labored breathing. No nasal flaring, retractions or accessory muscle use Speech is clear and coherent with logical content.  Patient is alert and oriented at baseline.  No coryza, rhinorrhea or scleral injection.  Assessment and Plan: 1. Lower respiratory tract infection  2. Acute cough  In the setting of asthma, allergies, cigarette smoker and hx of pneumonia and bronchits in the past, will start aggressive treatment with dual therapy to cover for CAP, and prednisone to cover for bronchitis. CXR in April of this year showed bilateral opacities to lung bases suggestive of atypical infection, pt admits this was never followed up on. Will have pt switch from handheld albuterol to albuterol nebs. Pt may continue mucinex, only take OTC cough med at night if unable to sleep.   Follow Up Instructions: I discussed the assessment and treatment plan with the patient. The patient was provided an opportunity to ask questions and all were answered. The patient agreed with the plan and demonstrated an understanding of the instructions.  A copy of instructions were sent to the patient via MyChart unless otherwise noted below.   The patient was advised to call  back or seek an in-person evaluation if the symptoms worsen or if the condition fails to improve as anticipated.  Time:  I spent 10 minutes with the patient via telehealth technology discussing the above problems/concerns.    [place], PA

## 2022-11-01 NOTE — Progress Notes (Signed)
Pt was seen at a 5:00pm video visit appointment. Pt scheduled this visit simply to ask clarifying questions regarding her treatment provided as she was unaware of the after visit summary available on my chart. Questions and concerns were answered and addressed.

## 2022-11-01 NOTE — Patient Instructions (Signed)
Pt was seen at a 5:00pm video visit appointment. Pt scheduled this visit simply to ask clarifying questions regarding her treatment provided as she was unaware of the after visit summary available on my chart. Questions and concerns were answered and addressed.

## 2022-11-01 NOTE — Patient Instructions (Addendum)
Amanda Webb, thank you for joining Chaney Malling, PA for today's virtual visit.  While this provider is not your primary care provider (PCP), if your PCP is located in our provider database this encounter information will be shared with them immediately following your visit.   [facility] account gives you access to today's visit and all your visits, tests, and labs performed at [facility] " click here if you don't have a [facility] account or go to [email or web address]  Consent: (Patient) Amanda Webb provided verbal consent for this virtual visit at the beginning of the encounter.  Current Medications:  Current Outpatient Medications:    albuterol (PROVENTIL) (2.5 MG/3ML) 0.083% nebulizer solution, Inhale 40m (one vial) via nebulized route every four hours around the clock for 24 hours, followed by one vial every 6 hours for the next 24 hours. After 48 hours, take 338mvia neb every 6-8 hours PRN cough, wheeze, Disp: 150 mL, Rfl: 1   amoxicillin-clavulanate (AUGMENTIN) 875-125 MG tablet, Take 1 tablet by mouth 2 (two) times daily for 5 days., Disp: 10 tablet, Rfl: 0   azithromycin (ZITHROMAX) 250 MG tablet, Take 2 tablets on day 1, then 1 tablet daily on days 2 through 5, Disp: 6 tablet, Rfl: 0   predniSONE (DELTASONE) 50 MG tablet, Take 1 tablet (50 mg total) by mouth daily with breakfast for 5 days., Disp: 5 tablet, Rfl: 0   Accu-Chek FastClix Lancets MISC, USE TO CHECK SUGAR UP TO TWICE DAILY AS DIRECTED, Disp: 102 each, Rfl: 5   albuterol (VENTOLIN HFA) 108 (90 Base) MCG/ACT inhaler, INHALE 1 TO 2 PUFFS BY MOUTH EVERY 4 TO 6 HOURS AS NEEDED FOR SHORTNESS OF BREATH, Disp: 18 g, Rfl: 2   ARIPiprazole (ABILIFY) 2 MG tablet, Take 2 mg by mouth every morning. , Disp: , Rfl:    atenolol (TENORMIN) 50 MG tablet, TAKE 1 TABLET(50 MG) BY MOUTH DAILY, Disp: 90 tablet, Rfl: 0   azelastine (ASTELIN) 0.1 % nasal spray, USE 1 SPRAY IN EACH NOSTRIL TWICE DAILY AS  DIRECTED, Disp: 30 mL, Rfl: 5   Cetirizine HCl 10 MG CAPS, Take 10 mg by mouth daily with lunch. , Disp: , Rfl:    Cyanocobalamin (B-12 COMPLIANCE INJECTION IJ), Inject 1 Dose as directed every 30 (thirty) days., Disp: , Rfl:    fluticasone (FLONASE) 50 MCG/ACT nasal spray, SHAKE LIQUID AND USE 2 SPRAYS IN EACH NOSTRIL EVERY DAY, Disp: 48 g, Rfl: 1   fluvoxaMINE (LUVOX) 100 MG tablet, Take 150 mg by mouth every morning. , Disp: , Rfl:    gabapentin (NEURONTIN) 100 MG capsule, Take by mouth., Disp: , Rfl:    glucose blood (ACCU-CHEK GUIDE) test strip, CHECK BLOOD SUGAR UP TO EVERY DAY AS DIRECTED, Disp: 200 strip, Rfl: 5   hydrOXYzine (VISTARIL) 25 MG capsule, Take 25 mg by mouth every 4 (four) hours as needed., Disp: , Rfl:    ketoconazole (NIZORAL) 2 % shampoo, APPLY EXTERNALLY 2 TIMES A WEEK, Disp: 120 mL, Rfl: 2   lamoTRIgine (LAMICTAL) 200 MG tablet, Take by mouth., Disp: , Rfl:    levothyroxine (SYNTHROID) 50 MCG tablet, TAKE 1 TABLET(50 MCG) BY MOUTH DAILY BEFORE BREAKFAST, Disp: 90 tablet, Rfl: 1   Multiple Vitamin (MULTIVITAMIN WITH MINERALS) TABS tablet, Take 1 tablet by mouth daily., Disp: , Rfl:    nystatin cream (MYCOSTATIN), Apply 1 application. topically 2 (two) times daily as needed (rash)., Disp: 30 g, Rfl: 5   OZEMPIC, 1  MG/DOSE, 4 MG/3ML SOPN, INJECT 1 MG UNDER THE SKIN ONE DAY A WEEK, Disp: 9 mL, Rfl: 1   pantoprazole (PROTONIX) 20 MG tablet, TAKE 1 TABLET(20 MG) BY MOUTH DAILY, Disp: 90 tablet, Rfl: 1   tamsulosin (FLOMAX) 0.4 MG CAPS capsule, Take 1 capsule (0.4 mg total) by mouth daily. For 2 weeks for kidney stone, Disp: 30 capsule, Rfl: 0   TRELEGY ELLIPTA 100-62.5-25 MCG/ACT AEPB, Inhale 1 puff into the lungs daily., Disp: 180 each, Rfl: 3   tretinoin (RETIN-A) 0.025 % cream, APPLY EXTERNALLY TO THE AFFECTED AREA AT BEDTIME AS NEEDED, Disp: 45 g, Rfl: 2  Current Facility-Administered Medications:    medroxyPROGESTERone (DEPO-PROVERA) injection 150 mg, 150 mg,  Intramuscular, Q90 days, Harlin Heys, MD, 150 mg at 01/29/22 5465   Medications ordered in this encounter:  Meds ordered this encounter  Medications   amoxicillin-clavulanate (AUGMENTIN) 875-125 MG tablet    Sig: Take 1 tablet by mouth 2 (two) times daily for 5 days.    Dispense:  10 tablet    Refill:  0    Order Specific Question:   Supervising Provider    Answer:   Chase Picket [[id number]]   azithromycin (ZITHROMAX) 250 MG tablet    Sig: Take 2 tablets on day 1, then 1 tablet daily on days 2 through 5    Dispense:  6 tablet    Refill:  0    Order Specific Question:   Supervising Provider    Answer:   Chase Picket [[id number]]   albuterol (PROVENTIL) (2.5 MG/3ML) 0.083% nebulizer solution    Sig: Inhale 48m (one vial) via nebulized route every four hours around the clock for 24 hours, followed by one vial every 6 hours for the next 24 hours. After 48 hours, take 35mvia neb every 6-8 hours PRN cough, wheeze    Dispense:  150 mL    Refill:  1    Order Specific Question:   Supervising Provider    Answer:   LAChase Picket1[[id number]] predniSONE (DELTASONE) 50 MG tablet    Sig: Take 1 tablet (50 mg total) by mouth daily with breakfast for 5 days.    Dispense:  5 tablet    Refill:  0    Order Specific Question:   Supervising Provider    Answer:   LAChase [id number]   *If you need refills on other medications prior to your next appointment, please contact your pharmacy*  Follow-Up: Call back or seek an in-person evaluation if the symptoms worsen or if the condition fails to improve as anticipated.  [id number] Instructions I suspect you have a lower respiratory tract infection. Please start taking the antibiotic, Augmentin, twice daily with food. Take it for all 5 days, do not stop early just because you feel better. Take an over the counter probiotic or yogurt daily to help prevent diarrhea/ yeast infection. You will  also take a Zpack per package directions concurrently, take until completed. Continue your home allergy and respiratory medications. You may also continue your mucinex. Start taking prednisone every morning with breakfast until gone. Stop your handheld albuterol and switch to nebulizer vials of albuterol. Do this every four hours for the next 24 hours, then switch to every 6 hours. Side effects can include palpitations and tremor, increase amount of time between treatments if adverse effects occur. It is also recommended that you use nasal saline/ sinus washes  to cleans the sinus passages. Hot steam from a shower or vaporizer may also be beneficial to help open up the upper airway. Eucalyptus can be helpful. If any worsening symptoms such as headache, fever, or shortness of breath, please go to an in person evaluation/ urgent care.   **Please follow up with your PCP regarding your abnormal chest xray from April and consider having repeat imaging in the next 6-8 weeks to ensure resolution.   If you have been instructed to have an in-person evaluation today at a local Urgent Care facility, please use the link below. It will take you to a list of all of our available [facility], including address, phone number and hours of operation. Please do not delay care.  [facility]  If you or a family member do not have a primary care provider, use the link below to schedule a visit and establish care. When you choose a [facility] primary care physician or advanced practice provider, you gain a long-term partner in health. Find a Primary Care Provider  Learn more about [facility]'s in-office and virtual care options: [facility]

## 2022-11-06 DIAGNOSIS — F4310 Post-traumatic stress disorder, unspecified: Secondary | ICD-10-CM | POA: Diagnosis not present

## 2022-11-12 ENCOUNTER — Inpatient Hospital Stay: Payer: Medicare Other | Attending: Oncology

## 2022-11-12 DIAGNOSIS — E538 Deficiency of other specified B group vitamins: Secondary | ICD-10-CM | POA: Diagnosis not present

## 2022-11-12 DIAGNOSIS — D509 Iron deficiency anemia, unspecified: Secondary | ICD-10-CM

## 2022-11-12 MED ORDER — CYANOCOBALAMIN 1000 MCG/ML IJ SOLN
1000.0000 ug | Freq: Once | INTRAMUSCULAR | Status: AC
  Administered 2022-11-12: 1000 ug via INTRAMUSCULAR
  Filled 2022-11-12: qty 1

## 2022-11-13 ENCOUNTER — Other Ambulatory Visit: Payer: Self-pay | Admitting: Pulmonary Disease

## 2022-11-16 ENCOUNTER — Ambulatory Visit: Payer: Medicare Other | Admitting: Physician Assistant

## 2022-11-19 ENCOUNTER — Ambulatory Visit (INDEPENDENT_AMBULATORY_CARE_PROVIDER_SITE_OTHER): Payer: Medicare Other | Admitting: Family Medicine

## 2022-11-19 ENCOUNTER — Encounter: Payer: Self-pay | Admitting: Family Medicine

## 2022-11-19 VITALS — BP 144/74 | HR 84 | Ht 67.0 in | Wt 252.0 lb

## 2022-11-19 DIAGNOSIS — N3001 Acute cystitis with hematuria: Secondary | ICD-10-CM | POA: Diagnosis not present

## 2022-11-19 DIAGNOSIS — R319 Hematuria, unspecified: Secondary | ICD-10-CM | POA: Diagnosis not present

## 2022-11-19 DIAGNOSIS — R310 Gross hematuria: Secondary | ICD-10-CM | POA: Diagnosis not present

## 2022-11-19 DIAGNOSIS — B379 Candidiasis, unspecified: Secondary | ICD-10-CM

## 2022-11-19 LAB — POCT URINALYSIS DIPSTICK
Bilirubin, UA: NEGATIVE
Glucose, UA: NEGATIVE
Ketones, UA: NEGATIVE
Nitrite, UA: NEGATIVE
Protein, UA: POSITIVE — AB
Spec Grav, UA: 1.01 (ref 1.010–1.025)
Urobilinogen, UA: 0.2 E.U./dL
pH, UA: 6.5 (ref 5.0–8.0)

## 2022-11-19 MED ORDER — CIPROFLOXACIN HCL 500 MG PO TABS: 500.0000 mg | ORAL_TABLET | Freq: Two times a day (BID) | ORAL | 0 refills | Status: AC

## 2022-11-19 MED ORDER — FLUCONAZOLE 150 MG PO TABS: ORAL_TABLET | ORAL | 0 refills | Status: DC

## 2022-11-19 NOTE — Patient Instructions (Addendum)
Thank you for coming to the office today.  1. You have a Urinary Tract Infection - this is very common, your symptoms are reassuring and you should get better within 1 week on the antibiotics - Start Cipro '500mg'$  2 times daily for next 7 days, complete entire course, even if feeling better - We sent urine for a culture, we will call you within next few days if we need to change antibiotics - Please drink plenty of fluids, improve hydration over next 1 week  If symptoms worsening, developing nausea / vomiting, worsening back pain, fevers / chills / sweats, then please return for re-evaluation sooner.  Keep up with the Urologist 11/27/22 at 10:30 - to further evaluate the blood in urine.   If you take AZO OTC - limit this to 2-3 days MAX to avoid affecting kidneys  D-Mannose is a natural supplement that can actually help bind to urinary bacteria and reduce their effectiveness it can help prevent UTI from forming, and may reduce some symptoms. It likely cannot cure an active UTI but it is worth a try and good to prevent them with. Try '500mg'$  twice a day at a full dose if you want, or check package instructions for more info   Please schedule a Follow-up Appointment to: Return if symptoms worsen or fail to improve.  If you have any other questions or concerns, please feel free to call the office or send a message through [facility]. You may also schedule an earlier appointment if necessary.  Additionally, you may be receiving a survey about your experience at our office within a few days to 1 week by e-mail or mail. We value your feedback.  [name], DO [facility]

## 2022-11-19 NOTE — Progress Notes (Signed)
Subjective:    Patient ID: Amanda Webb, female    DOB: 08/03/1971, 51 y.o.   MRN: [id number]  Amanda Webb is a 51 y.o. female presenting on 11/19/2022 for Hematuria   HPI  Gross Hematuria Dysuria Reports new onset symptoms recently with gross hematuria, notable amount of visible blood, she admits after drinking margarita recently it triggered then it went away and came back after another drink few days later. Has had episodic symptoms. Some dysuria burning. No fever. Some possible back vs flank pain but no consistency. She has already seen [facility] for gross hematuria few months ago, thought to be UTI. She has apt with them on 11/27/22 She has had Augmentin antibiotic often in past for sinus and UTI Denies fever chills nausea vomiting       11/19/2022   10:50 AM 10/19/2022    3:27 PM 08/13/2022   11:18 AM  Depression screen PHQ 2/9  Decreased Interest '3 2 2  '$ Down, Depressed, Hopeless '3 3 1  '$ PHQ - 2 Score '6 5 3  '$ Altered sleeping '3 3 3  '$ Tired, decreased energy '3 2 2  '$ Change in appetite '3 2 1  '$ Feeling bad or failure about yourself  '3 2 1  '$ Trouble concentrating 2 1 0  Moving slowly or fidgety/restless 0 0 0  Suicidal thoughts 0 0 0  PHQ-9 Score '20 15 10  '$ Difficult doing work/chores Not difficult at all Not difficult at all Somewhat difficult    Social History   Tobacco Use   Smoking status: Every Day    Packs/day: 1.00    Years: 32.00    Total pack years: 32.00    Types: Cigarettes   Smokeless tobacco: Never   Tobacco comments:    1 ppd/ 11/22/2020  Vaping Use   Vaping Use: Never used  Substance Use Topics   Alcohol use: Yes    Alcohol/week: 3.0 standard drinks of alcohol    Types: 3 Glasses of wine per week   Drug use: No    Review of Systems Per HPI unless specifically indicated above     Objective:    BP (!) 144/74   Pulse 84   Ht '5\' 7"'$  (1.702 m)   Wt 252 lb (114.3 kg)   SpO2 99%   BMI 39.47 kg/m   Wt Readings from Last 3 Encounters:   11/19/22 252 lb (114.3 kg)  10/19/22 260 lb (117.9 kg)  09/17/22 258 lb (117 kg)    Physical Exam Vitals and nursing note reviewed.  Constitutional:      General: She is not in acute distress.    Appearance: Normal appearance. She is well-developed. She is not diaphoretic.     Comments: Well-appearing, comfortable, cooperative  HENT:     Head: Normocephalic and atraumatic.  Eyes:     General:        Right eye: No discharge.        Left eye: No discharge.     Conjunctiva/sclera: Conjunctivae normal.  Cardiovascular:     Rate and Rhythm: Normal rate.  Pulmonary:     Effort: Pulmonary effort is normal.  Skin:    General: Skin is warm and dry.     Findings: No erythema or rash.  Neurological:     Mental Status: She is alert and oriented to person, place, and time.  Psychiatric:        Mood and Affect: Mood normal.        Behavior: Behavior normal.  Thought Content: Thought content normal.     Comments: Well groomed, good eye contact, normal speech and thoughts       Results for orders placed or performed in visit on 11/19/22  POCT Urinalysis Dipstick  Result Value Ref Range   Color, UA Yellow    Clarity, UA Cloudy    Glucose, UA Negative Negative   Bilirubin, UA Negative    Ketones, UA Negative    Spec Grav, UA 1.010 1.010 - 1.025   Blood, UA Large +++    pH, UA 6.5 5.0 - 8.0   Protein, UA Positive (A) Negative   Urobilinogen, UA 0.2 0.2 or 1.0 E.U./dL   Nitrite, UA Negative    Leukocytes, UA Moderate (2+) (A) Negative   Appearance     Odor        Assessment & Plan:   Problem List Items Addressed This Visit   None Visit Diagnoses     Gross hematuria    -  Primary   Relevant Orders   POCT Urinalysis Dipstick (Completed)   Urine Culture   Acute cystitis with hematuria       Relevant Medications   ciprofloxacin (CIPRO) 500 MG tablet   Antibiotic-induced yeast infection       Relevant Medications   fluconazole (DIFLUCAN) 150 MG tablet        Clinically consistent with UTI and UA suggestive, previously she was on antibiotics with prior urine culture. Now not on antibiotics prior No concern for pyelo  Followed by Urology [facility] already for gross hematuria previously 08/2022 was thought to have UTI only at that time  Plan: 1. UA suggestive of UTI WBC 2. Ordered Urine culture 3. Cipro '500mg'$  BID x 7 days, precautions given w/ MSK injury risk, she has tolerated levaquin well in past. 4. Improve PO hydration 5. RTC if no improvement 1-2 weeks, red flags given to return sooner  Advised her to keep already scheduled apt with Urology 11/27/22 to discuss gross hematuria work up with Urology, regarding CT vs Cysto   Meds ordered this encounter  Medications   ciprofloxacin (CIPRO) 500 MG tablet    Sig: Take 1 tablet (500 mg total) by mouth 2 (two) times daily for 7 days.    Dispense:  14 tablet    Refill:  0   fluconazole (DIFLUCAN) 150 MG tablet    Sig: Take one tablet by mouth on Day 1. Repeat dose 2nd tablet on Day 3.    Dispense:  2 tablet    Refill:  0      Follow up plan: Return if symptoms worsen or fail to improve.   [name], [facility] 11/19/2022, 11:25 AM

## 2022-11-22 ENCOUNTER — Other Ambulatory Visit: Payer: Self-pay

## 2022-11-22 ENCOUNTER — Ambulatory Visit: Payer: Medicare Other | Admitting: Physician Assistant

## 2022-11-22 ENCOUNTER — Encounter: Payer: Self-pay | Admitting: Family Medicine

## 2022-11-22 LAB — URINE CULTURE
MICRO NUMBER:: 14265866
SPECIMEN QUALITY:: ADEQUATE

## 2022-11-23 ENCOUNTER — Ambulatory Visit (INDEPENDENT_AMBULATORY_CARE_PROVIDER_SITE_OTHER): Payer: Medicare Other | Admitting: Physician Assistant

## 2022-11-23 ENCOUNTER — Encounter: Payer: Self-pay | Admitting: Physician Assistant

## 2022-11-23 VITALS — BP 130/84 | HR 85 | Ht 67.0 in | Wt 252.0 lb

## 2022-11-23 DIAGNOSIS — N390 Urinary tract infection, site not specified: Secondary | ICD-10-CM

## 2022-11-23 DIAGNOSIS — R310 Gross hematuria: Secondary | ICD-10-CM

## 2022-11-23 LAB — URINALYSIS, COMPLETE
Bilirubin, UA: NEGATIVE
Glucose, UA: NEGATIVE
Ketones, UA: NEGATIVE
Leukocytes,UA: NEGATIVE
Nitrite, UA: NEGATIVE
Protein,UA: NEGATIVE
RBC, UA: NEGATIVE
Specific Gravity, UA: <1.005 — ABNORMAL LOW (ref 1.005–1.030)
Urobilinogen, Ur: 0.2 mg/dL (ref 0.2–1.0)
pH, UA: 6.5 (ref 5.0–7.5)

## 2022-11-23 LAB — MICROSCOPIC EXAMINATION: Bacteria, UA: NONE SEEN

## 2022-11-23 NOTE — Progress Notes (Signed)
11/23/2022 8:55 AM   Amanda Webb 01/24/1971 [id number]  CC: Chief Complaint  Patient presents with   Follow-up   Hematuria    HPI: Amanda Webb is a 51 y.o. female with PMH recurrent UTIs with gross hematuria and mixed incontinence who presents today for evaluation of gross hematuria.   She saw her PCP 4 days ago with reports of dysuria, sensation of urethral tugging, and gross hematuria.  UA dip with protein, large blood, and moderate leukocytes.  She was started on empiric Cipro 500 mg twice daily x 7 days and her urine culture finalized with ampicillin resistant E. coli.  Today she reports her dysuria and gross hematuria have resolved.  She admits that seeing gross hematuria is a bit frightening and wonders if we need to pursue any further workup.  She denies episodes of gross hematuria not associated with dysuria.  In-office UA and microscopy today pan negative.  PMH: Past Medical History:  Diagnosis Date   Allergy    Anemia    Anxiety    Arrhythmia    Arthritis    Cervical dysplasia    Cystitis    Depression    Dyspnea    GERD (gastroesophageal reflux disease)    Gross hematuria    Headache    Heart murmur    asymptomatic   History of methicillin resistant staphylococcus aureus (MRSA) 2013   HLD (hyperlipidemia)    Hypertension    Hypothyroid    Lymphedema    Palpitations    Pernicious anemia    Tobacco abuse    Type 2 diabetes mellitus ([place])     Surgical History: Past Surgical History:  Procedure Laterality Date   CARPAL TUNNEL RELEASE Bilateral 2011   FOOT SURGERY Right 2013   Plantar fascia   KNEE ARTHROSCOPY WITH MEDIAL MENISECTOMY Right 07/11/2021   Procedure: Right knee arthroscopic partial medial meniscectomy;  Surgeon: Leim Fabry, MD;  Location: [facility];  Service: Orthopedics;  Laterality: Right;   ROBOTIC ASSISTED LAPAROSCOPIC CHOLECYSTECTOMY  07/15/2020   Dr Christian Mate   TONSILLECTOMY  1979   with ear tubes    Home  Medications:  Allergies as of 11/23/2022       Reactions   Bee Venom Anaphylaxis, Hives, Swelling   Carries Epi pen.   Cat Hair Extract Itching, Other (See Comments), Swelling   Allergic to trees, nuts, wheat, grass, cats & dogs - itchy watery eyes, swelling. Uses Zyrtec & Flonase & Benadryl if really bad. Used to get allergy shots. Allergic to trees, nuts, wheat, grass, cats & dogs - itchy watery eyes, swelling. Uses Zyrtec & Flonase & Benadryl if really bad. Used to get allergy shots.   Dog Epithelium Cough, Shortness Of Breath   Dog Epithelium Allergy Skin Test Shortness Of Breath   Dog Fennel Cough, Shortness Of Breath   Dog Fennel Allergy Skin Test Shortness Of Breath   Dust Mite Extract Cough, Shortness Of Breath   Tetracyclines & Related Nausea And Vomiting   Lactose Diarrhea, Nausea And Vomiting   Milk (cow) Diarrhea, Nausea And Vomiting   Milk Protein Diarrhea, Nausea And Vomiting   Milk-related Compounds Diarrhea, Nausea And Vomiting   Tape Other (See Comments), Rash   Needs to use paper tape. Breaks out with severe rash, pulls skin off when using adhesive.   Wound Dressing Adhesive Rash   Other reaction(s): Other Needs to use paper tape. Breaks out with severe rash, pulls skin off when using adhesive. Needs to use  paper tape. Breaks out with severe rash, pulls skin off when using adhesive. Needs to use paper tape. Breaks out with severe rash, pulls skin off when using adhesive.        Medication List        Accurate as of November 23, 2022  8:55 AM. If you have any questions, ask your nurse or doctor.          Accu-Chek FastClix Lancets Misc USE TO CHECK SUGAR UP TO TWICE DAILY AS DIRECTED   Accu-Chek Guide test strip Generic drug: glucose blood CHECK BLOOD SUGAR UP TO EVERY DAY AS DIRECTED   albuterol 108 (90 Base) MCG/ACT inhaler Commonly known as: VENTOLIN HFA INHALE 1 TO 2 PUFFS BY MOUTH EVERY 4 TO 6 HOURS AS NEEDED FOR SHORTNESS OF BREATH   albuterol  (2.5 MG/3ML) 0.083% nebulizer solution Commonly known as: PROVENTIL Inhale 71m (one vial) via nebulized route every four hours around the clock for 24 hours, followed by one vial every 6 hours for the next 24 hours. After 48 hours, take 327mvia neb every 6-8 hours PRN cough, wheeze   ARIPiprazole 2 MG tablet Commonly known as: ABILIFY Take 2 mg by mouth every morning.   atenolol 50 MG tablet Commonly known as: TENORMIN TAKE 1 TABLET(50 MG) BY MOUTH DAILY   azelastine 0.1 % nasal spray Commonly known as: ASTELIN USE 1 SPRAY IN EACH NOSTRIL TWICE DAILY AS DIRECTED   B-12 COMPLIANCE INJECTION IJ Inject 1 Dose as directed every 30 (thirty) days.   Cetirizine HCl 10 MG Caps Take 10 mg by mouth daily with lunch.   ciprofloxacin 500 MG tablet Commonly known as: Cipro Take 1 tablet (500 mg total) by mouth 2 (two) times daily for 7 days.   fluconazole 150 MG tablet Commonly known as: DIFLUCAN Take one tablet by mouth on Day 1. Repeat dose 2nd tablet on Day 3.   fluticasone 50 MCG/ACT nasal spray Commonly known as: FLONASE SHAKE LIQUID AND USE 2 SPRAYS IN EACH NOSTRIL EVERY DAY   fluvoxaMINE 100 MG tablet Commonly known as: LUVOX Take 150 mg by mouth every morning.   gabapentin 100 MG capsule Commonly known as: NEURONTIN Take by mouth.   ketoconazole 2 % shampoo Commonly known as: NIZORAL APPLY EXTERNALLY 2 TIMES A WEEK   lamoTRIgine 200 MG tablet Commonly known as: LAMICTAL Take by mouth.   levothyroxine 50 MCG tablet Commonly known as: SYNTHROID TAKE 1 TABLET(50 MCG) BY MOUTH DAILY BEFORE BREAKFAST   multivitamin with minerals Tabs tablet Take 1 tablet by mouth daily.   nystatin cream Commonly known as: MYCOSTATIN Apply 1 application. topically 2 (two) times daily as needed (rash).   Ozempic (1 MG/DOSE) 4 MG/3ML Sopn Generic drug: Semaglutide (1 MG/DOSE) INJECT 1 MG UNDER THE SKIN ONE DAY A WEEK   pantoprazole 20 MG tablet Commonly known as: PROTONIX TAKE 1  TABLET(20 MG) BY MOUTH DAILY   Trelegy Ellipta 100-62.5-25 MCG/ACT Aepb Generic drug: Fluticasone-Umeclidin-Vilant Inhale 1 puff into the lungs daily.   tretinoin 0.025 % cream Commonly known as: RETIN-A APPLY EXTERNALLY TO THE AFFECTED AREA AT BEDTIME AS NEEDED        Allergies:  Allergies  Allergen Reactions   Bee Venom Anaphylaxis, Hives and Swelling    Carries Epi pen.    Cat Hair Extract Itching, Other (See Comments) and Swelling    Allergic to trees, nuts, wheat, grass, cats & dogs - itchy watery eyes, swelling. Uses Zyrtec & Flonase & Benadryl if really  bad. Used to get allergy shots. Allergic to trees, nuts, wheat, grass, cats & dogs - itchy watery eyes, swelling. Uses Zyrtec & Flonase & Benadryl if really bad. Used to get allergy shots.   Dog Epithelium Cough and Shortness Of Breath   Dog Epithelium Allergy Skin Test Shortness Of Breath   Dog Fennel Cough and Shortness Of Breath   Dog Fennel Allergy Skin Test Shortness Of Breath   Dust Mite Extract Cough and Shortness Of Breath   Tetracyclines & Related Nausea And Vomiting   Lactose Diarrhea and Nausea And Vomiting   Milk (Cow) Diarrhea and Nausea And Vomiting   Milk Protein Diarrhea and Nausea And Vomiting   Milk-Related Compounds Diarrhea and Nausea And Vomiting   Tape Other (See Comments) and Rash    Needs to use paper tape. Breaks out with severe rash, pulls skin off when using adhesive.   Wound Dressing Adhesive Rash    Other reaction(s): Other Needs to use paper tape. Breaks out with severe rash, pulls skin off when using adhesive.  Needs to use paper tape. Breaks out with severe rash, pulls skin off when using adhesive. Needs to use paper tape. Breaks out with severe rash, pulls skin off when using adhesive.    Family History: Family History  Problem Relation Age of Onset   Depression Mother    Mental illness Mother    Alcohol abuse Mother    Lung cancer Mother    Cancer Maternal Grandmother     Diabetes Maternal Grandmother    Ovarian cancer Maternal Grandmother    Kidney disease Maternal Grandfather    Diabetes Father    Mental illness Father    Depression Father    Drug abuse Father    Depression Paternal Grandfather    Drug abuse Paternal Grandfather    Bladder Cancer Neg Hx    Breast cancer Neg Hx     Social History:   reports that she has been smoking cigarettes. She has a 32.00 pack-year smoking history. She has been exposed to tobacco smoke. She has never used smokeless tobacco. She reports current alcohol use of about 3.0 standard drinks of alcohol per week. She reports that she does not use drugs.  Physical Exam: BP 130/84   Pulse 85   Ht '5\' 7"'$  (1.702 m)   Wt 252 lb (114.3 kg)   BMI 39.47 kg/m   Constitutional:  Alert and oriented, no acute distress, nontoxic appearing HEENT: [place], AT Cardiovascular: No clubbing, cyanosis, or edema Respiratory: Normal respiratory effort, no increased work of breathing Skin: No rashes, bruises or suspicious lesions Neurologic: Grossly intact, no focal deficits, moving all 4 extremities Psychiatric: Normal mood and affect  Laboratory Data: Results for orders placed or performed in visit on 11/23/22  Microscopic Examination   Urine  Result Value Ref Range   WBC, UA 0-5 0 - 5 /hpf   RBC, Urine 0-2 0 - 2 /hpf   Epithelial Cells (non renal) 0-10 0 - 10 /hpf   Crystals Present (A) N/A   Crystal Type Amorphous Sediment N/A   Bacteria, UA None seen None seen/Few  Urinalysis, Complete  Result Value Ref Range   Specific Gravity, UA <1.005 (L) 1.005 - 1.030   pH, UA 6.5 5.0 - 7.5   Color, UA Yellow Yellow   Appearance Ur Clear Clear   Leukocytes,UA Negative Negative   Protein,UA Negative Negative/Trace   Glucose, UA Negative Negative   Ketones, UA Negative Negative   RBC, UA Negative  Negative   Bilirubin, UA Negative Negative   Urobilinogen, Ur 0.2 0.2 - 1.0 mg/dL   Nitrite, UA Negative Negative   Microscopic Examination  See below:    Assessment & Plan:   1. Gross hematuria Resolved on culture appropriate antibiotics.  We discussed that infection is the most common cause of hematuria, however urinary malignancies are also on the differential.  I find the fact that her hematuria resolved with culture appropriate antibiotics to be rather reassuring, however I offered her a hematuria workup today out of an abundance of caution.  Since her hematuria is resolved, she feels comfortable deferring this at this time, which is reasonable.    We discussed return precautions including recurrent gross hematuria, especially in the absence of dysuria. - Urinalysis, Complete - CULTURE, URINE COMPREHENSIVE  2. Recurrent UTI Likely source of #1 above.  I counseled her to complete her prescribed Cipro, which is culture appropriate.  Return if symptoms worsen or fail to improve.  Debroah Loop, PA-C  [facility] [address], [place], [place] [address] [phone]

## 2022-11-26 ENCOUNTER — Other Ambulatory Visit: Payer: Self-pay | Admitting: Family Medicine

## 2022-11-26 ENCOUNTER — Encounter: Payer: Self-pay | Admitting: Family Medicine

## 2022-11-26 DIAGNOSIS — R7303 Prediabetes: Secondary | ICD-10-CM

## 2022-11-27 ENCOUNTER — Other Ambulatory Visit: Payer: Self-pay

## 2022-11-27 ENCOUNTER — Ambulatory Visit: Payer: Medicare Other | Admitting: Physician Assistant

## 2022-11-27 ENCOUNTER — Encounter: Payer: Self-pay | Admitting: Obstetrics and Gynecology

## 2022-11-27 LAB — CULTURE, URINE COMPREHENSIVE

## 2022-11-27 NOTE — Telephone Encounter (Signed)
Requested Prescriptions  Pending Prescriptions Disp Refills   Accu-Chek FastClix Lancets MISC [Pharmacy Med Name: ACCU-CHEK FASTCLIX LANCETS 102'S] 102 each 5    Sig: USE TO CHECK BLOOD SUGAR UP TO TWICE DAILY AS DIRECTED     Endocrinology: Diabetes - Testing Supplies Passed - 11/26/2022 11:19 PM      Passed - Valid encounter within last 12 months    Recent Outpatient Visits           1 week ago Gross hematuria   [place], DO   1 month ago Type 2 diabetes mellitus with other specified complication, without long-term current use of insulin [facility])   [place], DO   3 months ago Acute cystitis with hematuria   [place], DO   4 months ago Eustachian tube dysfunction, right   [place], DO   4 months ago Eustachian tube dysfunction, right   [place], DO

## 2022-12-01 ENCOUNTER — Encounter: Payer: Self-pay | Admitting: Family Medicine

## 2022-12-03 ENCOUNTER — Ambulatory Visit (INDEPENDENT_AMBULATORY_CARE_PROVIDER_SITE_OTHER): Payer: Medicare Other

## 2022-12-03 ENCOUNTER — Other Ambulatory Visit: Payer: Self-pay

## 2022-12-03 VITALS — BP 137/84 | HR 80 | Ht 67.0 in | Wt 255.4 lb

## 2022-12-03 DIAGNOSIS — K219 Gastro-esophageal reflux disease without esophagitis: Secondary | ICD-10-CM

## 2022-12-03 DIAGNOSIS — Z3042 Encounter for surveillance of injectable contraceptive: Secondary | ICD-10-CM

## 2022-12-03 MED ORDER — MEDROXYPROGESTERONE ACETATE 150 MG/ML IM SUSP
150.0000 mg | Freq: Once | INTRAMUSCULAR | Status: AC
  Administered 2022-12-03: 150 mg via INTRAMUSCULAR

## 2022-12-03 MED ORDER — PANTOPRAZOLE SODIUM 20 MG PO TBEC: DELAYED_RELEASE_TABLET | ORAL | 1 refills | Status: DC

## 2022-12-03 NOTE — Progress Notes (Signed)
Date last pap: 09/27/20. Last Depo-Provera: 09/17/22. Side Effects if any: n/a. Serum HCG indicated? N/a. Depo-Provera 150 mg IM given by: Douglass Rivers, CMA. Next appointment due March 5-19th, 2024. Advised patient to schedule an annual appointment.

## 2022-12-04 ENCOUNTER — Other Ambulatory Visit: Payer: Self-pay | Admitting: Family Medicine

## 2022-12-04 DIAGNOSIS — K219 Gastro-esophageal reflux disease without esophagitis: Secondary | ICD-10-CM

## 2022-12-04 NOTE — Telephone Encounter (Signed)
Duplicate request- Rx sent yetserday Requested Prescriptions  Pending Prescriptions Disp Refills   pantoprazole (PROTONIX) 20 MG tablet [Pharmacy Med Name: PANTOPRAZOLE '20MG'$  TABLETS] 90 tablet 1    Sig: TAKE 1 TABLET(20 MG) BY MOUTH DAILY     Gastroenterology: Proton Pump Inhibitors Passed - 12/04/2022 10:05 AM      Passed - Valid encounter within last 12 months    Recent Outpatient Visits           2 weeks ago Gross hematuria   [place], Devonne Doughty, DO   1 month ago Type 2 diabetes mellitus with other specified complication, without long-term current use of insulin [facility])   [place], DO   3 months ago Acute cystitis with hematuria   [place], DO   4 months ago Eustachian tube dysfunction, right   [place], DO   4 months ago Eustachian tube dysfunction, right   [place], DO

## 2022-12-08 ENCOUNTER — Other Ambulatory Visit: Payer: Self-pay | Admitting: Family Medicine

## 2022-12-08 DIAGNOSIS — J309 Allergic rhinitis, unspecified: Secondary | ICD-10-CM

## 2022-12-11 ENCOUNTER — Encounter: Payer: Self-pay | Admitting: Family Medicine

## 2022-12-11 NOTE — Telephone Encounter (Signed)
Requested Prescriptions  Pending Prescriptions Disp Refills   fluticasone (FLONASE) 50 MCG/ACT nasal spray [Pharmacy Med Name: FLUTICASONE 50MCG NASAL SP (120) RX] 48 g 1    Sig: SHAKE LIQUID AND USE 2 SPRAYS IN EACH NOSTRIL EVERY DAY     Ear, Nose, and Throat: Nasal Preparations - Corticosteroids Passed - 12/08/2022  6:32 AM      Passed - Valid encounter within last 12 months    Recent Outpatient Visits           3 weeks ago Gross hematuria   [place], Devonne Doughty, DO   1 month ago Type 2 diabetes mellitus with other specified complication, without long-term current use of insulin [facility])   [place], DO   4 months ago Acute cystitis with hematuria   [place], DO   4 months ago Eustachian tube dysfunction, right   [place], DO   5 months ago Eustachian tube dysfunction, right   [place], DO

## 2022-12-13 ENCOUNTER — Inpatient Hospital Stay: Payer: Medicare Other

## 2022-12-13 ENCOUNTER — Inpatient Hospital Stay: Payer: Medicare Other | Attending: Oncology

## 2022-12-13 DIAGNOSIS — E538 Deficiency of other specified B group vitamins: Secondary | ICD-10-CM | POA: Diagnosis not present

## 2022-12-13 DIAGNOSIS — D509 Iron deficiency anemia, unspecified: Secondary | ICD-10-CM | POA: Diagnosis not present

## 2022-12-13 LAB — CBC WITH DIFFERENTIAL/PLATELET
Abs Immature Granulocytes: 0.03 10*3/uL (ref 0.00–0.07)
Basophils Absolute: 0 10*3/uL (ref 0.0–0.1)
Basophils Relative: 0 %
Eosinophils Absolute: 0.2 10*3/uL (ref 0.0–0.5)
Eosinophils Relative: 2 %
HCT: 42.1 % (ref 36.0–46.0)
Hemoglobin: 14.6 g/dL (ref 12.0–15.0)
Immature Granulocytes: 0 %
Lymphocytes Relative: 32 %
Lymphs Abs: 3.4 10*3/uL (ref 0.7–4.0)
MCH: 31.7 pg (ref 26.0–34.0)
MCHC: 34.7 g/dL (ref 30.0–36.0)
MCV: 91.3 fL (ref 80.0–100.0)
Monocytes Absolute: 0.7 10*3/uL (ref 0.1–1.0)
Monocytes Relative: 6 %
Neutro Abs: 6.4 10*3/uL (ref 1.7–7.7)
Neutrophils Relative %: 60 %
Platelets: 253 10*3/uL (ref 150–400)
RBC: 4.61 MIL/uL (ref 3.87–5.11)
RDW: 12.3 % (ref 11.5–15.5)
WBC: 10.7 10*3/uL — ABNORMAL HIGH (ref 4.0–10.5)
nRBC: 0 % (ref 0.0–0.2)

## 2022-12-13 LAB — IRON AND TIBC
Iron: 77 ug/dL (ref 28–170)
Saturation Ratios: 21 % (ref 10.4–31.8)
TIBC: 368 ug/dL (ref 250–450)
UIBC: 291 ug/dL

## 2022-12-13 LAB — FERRITIN: Ferritin: 84 ng/mL (ref 11–307)

## 2022-12-13 MED ORDER — CYANOCOBALAMIN 1000 MCG/ML IJ SOLN
1000.0000 ug | Freq: Once | INTRAMUSCULAR | Status: AC
  Administered 2022-12-13: 1000 ug via INTRAMUSCULAR
  Filled 2022-12-13: qty 1

## 2022-12-20 DIAGNOSIS — F4310 Post-traumatic stress disorder, unspecified: Secondary | ICD-10-CM | POA: Diagnosis not present

## 2023-01-01 ENCOUNTER — Encounter: Payer: Self-pay | Admitting: Family Medicine

## 2023-01-01 ENCOUNTER — Telehealth (INDEPENDENT_AMBULATORY_CARE_PROVIDER_SITE_OTHER): Payer: Medicare Other | Admitting: Family Medicine

## 2023-01-01 VITALS — Ht 67.0 in | Wt 255.0 lb

## 2023-01-01 DIAGNOSIS — E1169 Type 2 diabetes mellitus with other specified complication: Secondary | ICD-10-CM | POA: Diagnosis not present

## 2023-01-01 DIAGNOSIS — Z6841 Body Mass Index (BMI) 40.0 and over, adult: Secondary | ICD-10-CM | POA: Diagnosis not present

## 2023-01-01 DIAGNOSIS — Z7985 Long-term (current) use of injectable non-insulin antidiabetic drugs: Secondary | ICD-10-CM | POA: Diagnosis not present

## 2023-01-01 NOTE — Progress Notes (Signed)
Subjective:    Patient ID: Amanda Webb, female    DOB: 08-Jan-1971, 52 y.o.   MRN: [id number]  Amanda Webb is a 52 y.o. female presenting on 01/01/2023 for Diabetes  Virtual / Telehealth Encounter - Video Visit via MyChart The purpose of this virtual visit is to provide medical care while limiting exposure to the novel coronavirus (COVID19) for both patient and office staff.  Consent was obtained for remote visit:  Yes.   Answered questions that patient had about telehealth interaction:  Yes.   I discussed the limitations, risks, security and privacy concerns of performing an evaluation and management service by video/telephone. I also discussed with the patient that there may be a patient responsible charge related to this service. The patient expressed understanding and agreed to proceed.  Patient Location: Home Provider Location: [facility] (Office)  Participants in virtual visit: - Patient: [place]: Orinda Kenner, CMA - Provider: Dr Parks Ranger   HPI  Type 2 Diabetes Morbid Obesity BMI >40   Initial A1c 6.9 (12/2021) On Ozempic 0.25 to 0.'5mg'$  weekly since Jan 2023 Checking sugars regularly Admits some improvement on Ozempic with reduced appetite, but admits still sedentary, has not lost much weight instead she gained a few lbs back   Current A1c today down to 6.6 (10/19/22)  Over 1 year Weight 264 lbs (12/19/21) onset with medication, weight down to 250 lbs (07/2022)  Asking about issue with appetite, seems she is still eating too much to the point of feeling sick. She will feel full but keep eating. Admits goal to limit this in future. She is not exercising currently, her goal is to resume walking    Denies hypoglycemia, polyuria, visual changes, numbness or tingling.      11/19/2022   10:50 AM 10/19/2022    3:27 PM 08/13/2022   11:18 AM  Depression screen PHQ 2/9  Decreased Interest '3 2 2  '$ Down, Depressed, Hopeless '3 3 1  '$ PHQ  - 2 Score '6 5 3  '$ Altered sleeping '3 3 3  '$ Tired, decreased energy '3 2 2  '$ Change in appetite '3 2 1  '$ Feeling bad or failure about yourself  '3 2 1  '$ Trouble concentrating 2 1 0  Moving slowly or fidgety/restless 0 0 0  Suicidal thoughts 0 0 0  PHQ-9 Score '20 15 10  '$ Difficult doing work/chores Not difficult at all Not difficult at all Somewhat difficult    Social History   Tobacco Use   Smoking status: Every Day    Packs/day: 1.00    Years: 32.00    Total pack years: 32.00    Types: Cigarettes    Passive exposure: Current   Smokeless tobacco: Never   Tobacco comments:    1 ppd/ 11/22/2020  Vaping Use   Vaping Use: Never used  Substance Use Topics   Alcohol use: Yes    Alcohol/week: 3.0 standard drinks of alcohol    Types: 3 Glasses of wine per week   Drug use: No    Review of Systems Per HPI unless specifically indicated above     Objective:    Ht '5\' 7"'$  (1.702 m)   Wt 255 lb (115.7 kg)   BMI 39.94 kg/m   Wt Readings from Last 3 Encounters:  01/01/23 255 lb (115.7 kg)  12/03/22 255 lb 6.4 oz (115.8 kg)  11/23/22 252 lb (114.3 kg)    Physical Exam  Note examination was completely remotely via video  observation objective data only  Gen - well-appearing, no acute distress or apparent pain, comfortable HEENT - eyes appear clear without discharge or redness Heart/Lungs - cannot examine virtually - observed no evidence of coughing or labored breathing. Abd - cannot examine virtually  Skin - face visible today- no rash Neuro - awake, alert, oriented Psych - not anxious appearing   Results for orders placed or performed in visit on 12/13/22  Iron and TIBC  Result Value Ref Range   Iron 77 28 - 170 ug/dL   TIBC 368 250 - 450 ug/dL   Saturation Ratios 21 10.4 - 31.8 %   UIBC 291 ug/dL  CBC with Differential/Platelet  Result Value Ref Range   WBC 10.7 (H) 4.0 - 10.5 K/uL   RBC 4.61 3.87 - 5.11 MIL/uL   Hemoglobin 14.6 12.0 - 15.0 g/dL   HCT 42.1 36.0 - 46.0 %    MCV 91.3 80.0 - 100.0 fL   MCH 31.7 26.0 - 34.0 pg   MCHC 34.7 30.0 - 36.0 g/dL   RDW 12.3 11.5 - 15.5 %   Platelets 253 150 - 400 K/uL   nRBC 0.0 0.0 - 0.2 %   Neutrophils Relative % 60 %   Neutro Abs 6.4 1.7 - 7.7 K/uL   Lymphocytes Relative 32 %   Lymphs Abs 3.4 0.7 - 4.0 K/uL   Monocytes Relative 6 %   Monocytes Absolute 0.7 0.1 - 1.0 K/uL   Eosinophils Relative 2 %   Eosinophils Absolute 0.2 0.0 - 0.5 K/uL   Basophils Relative 0 %   Basophils Absolute 0.0 0.0 - 0.1 K/uL   Immature Granulocytes 0 %   Abs Immature Granulocytes 0.03 0.00 - 0.07 K/uL  Ferritin  Result Value Ref Range   Ferritin 84 11 - 307 ng/mL      Assessment & Plan:   Problem List Items Addressed This Visit     Type 2 diabetes mellitus with other specified complication ([place]) - Primary   Other Visit Diagnoses     Morbid obesity with BMI of 40.0-44.9, adult ([place])           Type 2 Diabetes Morbid Obesity BMI >40  A1c 6.6 on last lab 10/2022. Weight down 10-14 lbs in 1 year, but had gained weight back  Failed dietary intervention, her goal is to improve this now with reduced BY MOUTH intake with assistance of medication  Improved on Ozempic '1mg'$  weekly inj after previously dose was increased.  Discussion today that we could consider '2mg'$  vs switch to [facility] '5mg'$  weekly inj  She will consider options but her goal is to improve appetite and start walking exercise to help the medicine work on further controlling sugar and weight loss.  Notify me if change mind and need med ordered.   No orders of the defined types were placed in this encounter.     Follow up plan: Return if symptoms worsen or fail to improve.   Patient verbalizes understanding with the above medical recommendations including the limitation of remote medical advice.  Specific follow-up and call-back criteria were given for patient to follow-up or seek medical care more urgently if needed.  Total duration of direct patient  care provided via video conference: 10 minutes   [name], [facility] 01/01/2023, 11:42 AM

## 2023-01-01 NOTE — Patient Instructions (Addendum)
   Please schedule a Follow-up Appointment to: Return if symptoms worsen or fail to improve.  If you have any other questions or concerns, please feel free to call the office or send a message through [place]. You may also schedule an earlier appointment if necessary.  Additionally, you may be receiving a survey about your experience at our office within a few days to 1 week by e-mail or mail. We value your feedback.  [name], DO [facility]

## 2023-01-07 ENCOUNTER — Other Ambulatory Visit: Payer: Self-pay | Admitting: Family Medicine

## 2023-01-07 DIAGNOSIS — I10 Essential (primary) hypertension: Secondary | ICD-10-CM

## 2023-01-08 NOTE — Telephone Encounter (Signed)
Requested Prescriptions  Pending Prescriptions Disp Refills   atenolol (TENORMIN) 50 MG tablet [Pharmacy Med Name: ATENOLOL '50MG'$  TABLETS] 90 tablet 1    Sig: TAKE 1 TABLET(50 MG) BY MOUTH DAILY     Cardiovascular: Beta Blockers 2 Failed - 01/07/2023  6:31 AM      Failed - Cr in normal range and within 360 days    Creat  Date Value Ref Range Status  11/08/2020 0.64 0.50 - 1.10 mg/dL Final   Creatinine, Urine  Date Value Ref Range Status  10/19/2022 24 20 - 275 mg/dL Final         Passed - Last BP in normal range    BP Readings from Last 1 Encounters:  12/03/22 137/84         Passed - Last Heart Rate in normal range    Pulse Readings from Last 1 Encounters:  12/03/22 80         Passed - Valid encounter within last 6 months    Recent Outpatient Visits           1 week ago Type 2 diabetes mellitus with other specified complication, without long-term current use of insulin [facility])   [facility] Olin Hauser, DO   1 month ago Gross hematuria   [place], DO   2 months ago Type 2 diabetes mellitus with other specified complication, without long-term current use of insulin [facility])   [place], DO   4 months ago Acute cystitis with hematuria   [place], DO   5 months ago Eustachian tube dysfunction, right   [place], [place]

## 2023-01-11 ENCOUNTER — Inpatient Hospital Stay: Payer: Medicare Other | Attending: Oncology

## 2023-01-11 DIAGNOSIS — E538 Deficiency of other specified B group vitamins: Secondary | ICD-10-CM | POA: Diagnosis not present

## 2023-01-11 DIAGNOSIS — D509 Iron deficiency anemia, unspecified: Secondary | ICD-10-CM

## 2023-01-11 MED ORDER — CYANOCOBALAMIN 1000 MCG/ML IJ SOLN
1000.0000 ug | Freq: Once | INTRAMUSCULAR | Status: AC
  Administered 2023-01-11: 1000 ug via INTRAMUSCULAR
  Filled 2023-01-11: qty 1

## 2023-01-22 ENCOUNTER — Ambulatory Visit (INDEPENDENT_AMBULATORY_CARE_PROVIDER_SITE_OTHER): Payer: Medicare Other | Admitting: Family Medicine

## 2023-01-22 ENCOUNTER — Encounter: Payer: Self-pay | Admitting: Family Medicine

## 2023-01-22 VITALS — BP 120/70 | HR 87 | Ht 67.0 in | Wt 256.0 lb

## 2023-01-22 DIAGNOSIS — B379 Candidiasis, unspecified: Secondary | ICD-10-CM

## 2023-01-22 DIAGNOSIS — N390 Urinary tract infection, site not specified: Secondary | ICD-10-CM

## 2023-01-22 LAB — POCT URINALYSIS DIPSTICK
Bilirubin, UA: NEGATIVE
Glucose, UA: NEGATIVE
Ketones, UA: NEGATIVE
Nitrite, UA: NEGATIVE
Protein, UA: NEGATIVE
Spec Grav, UA: 1.015 (ref 1.010–1.025)
Urobilinogen, UA: 0.2 E.U./dL
pH, UA: 7 (ref 5.0–8.0)

## 2023-01-22 MED ORDER — CEPHALEXIN 500 MG PO CAPS: 500.0000 mg | ORAL_CAPSULE | Freq: Three times a day (TID) | ORAL | 0 refills | Status: DC

## 2023-01-22 MED ORDER — FLUCONAZOLE 150 MG PO TABS: ORAL_TABLET | ORAL | 0 refills | Status: DC

## 2023-01-22 NOTE — Progress Notes (Signed)
Subjective:    Patient ID: Amanda Webb, female    DOB: 10/21/1971, 52 y.o.   MRN: [id number]  Amanda Webb is a 52 y.o. female presenting on 01/22/2023 for Hematuria  Patient presents for a same day appointment.  HPI  Recurrent UTI history Currently having UTI symptoms. Believes UTI triggered after bowel accident could not get into bathroom Symptoms started last week Thursday 2/1 Some improvement then worse again Previously treated in August with Bactrim. Then recurrent UTI 12/4 > Cipro Followed by Urology Denies fever chills nausea vomiting       11/19/2022   10:50 AM 10/19/2022    3:27 PM 08/13/2022   11:18 AM  Depression screen PHQ 2/9  Decreased Interest '3 2 2  '$ Down, Depressed, Hopeless '3 3 1  '$ PHQ - 2 Score '6 5 3  '$ Altered sleeping '3 3 3  '$ Tired, decreased energy '3 2 2  '$ Change in appetite '3 2 1  '$ Feeling bad or failure about yourself  '3 2 1  '$ Trouble concentrating 2 1 0  Moving slowly or fidgety/restless 0 0 0  Suicidal thoughts 0 0 0  PHQ-9 Score '20 15 10  '$ Difficult doing work/chores Not difficult at all Not difficult at all Somewhat difficult    Social History   Tobacco Use   Smoking status: Every Day    Packs/day: 1.00    Years: 32.00    Total pack years: 32.00    Types: Cigarettes    Passive exposure: Current   Smokeless tobacco: Never   Tobacco comments:    1 ppd/ 11/22/2020  Vaping Use   Vaping Use: Never used  Substance Use Topics   Alcohol use: Yes    Alcohol/week: 3.0 standard drinks of alcohol    Types: 3 Glasses of wine per week   Drug use: No    Review of Systems Per HPI unless specifically indicated above     Objective:    BP 120/70   Pulse 87   Ht '5\' 7"'$  (1.702 m)   Wt 256 lb (116.1 kg)   SpO2 99%   BMI 40.10 kg/m   Wt Readings from Last 3 Encounters:  01/22/23 256 lb (116.1 kg)  01/01/23 255 lb (115.7 kg)  12/03/22 255 lb 6.4 oz (115.8 kg)    Physical Exam Vitals and nursing note reviewed.  Constitutional:      General:  She is not in acute distress.    Appearance: Normal appearance. She is well-developed. She is not diaphoretic.     Comments: Well-appearing, comfortable, cooperative  HENT:     Head: Normocephalic and atraumatic.  Eyes:     General:        Right eye: No discharge.        Left eye: No discharge.     Conjunctiva/sclera: Conjunctivae normal.  Cardiovascular:     Rate and Rhythm: Normal rate.  Pulmonary:     Effort: Pulmonary effort is normal.  Skin:    General: Skin is warm and dry.     Findings: No erythema or rash.  Neurological:     Mental Status: She is alert and oriented to person, place, and time.  Psychiatric:        Mood and Affect: Mood normal.        Behavior: Behavior normal.        Thought Content: Thought content normal.     Comments: Well groomed, good eye contact, normal speech and thoughts    Results for orders  placed or performed in visit on 01/22/23  POCT Urinalysis Dipstick  Result Value Ref Range   Color, UA Yellow    Clarity, UA Cloudy    Glucose, UA Negative Negative   Bilirubin, UA Negative    Ketones, UA Negative    Spec Grav, UA 1.015 1.010 - 1.025   Blood, UA Moderate ++    pH, UA 7.0 5.0 - 8.0   Protein, UA Negative Negative   Urobilinogen, UA 0.2 0.2 or 1.0 E.U./dL   Nitrite, UA Negative    Leukocytes, UA Small (1+) (A) Negative   Appearance     Odor        Assessment & Plan:   Problem List Items Addressed This Visit   None Visit Diagnoses     Recurrent UTI    -  Primary   Relevant Medications   cephALEXin (KEFLEX) 500 MG capsule   fluconazole (DIFLUCAN) 150 MG tablet   Other Relevant Orders   POCT Urinalysis Dipstick (Completed)   Urine Culture   Antibiotic-induced yeast infection       Relevant Medications   cephALEXin (KEFLEX) 500 MG capsule   fluconazole (DIFLUCAN) 150 MG tablet       Clinically consistent with UTI and confirmed on UA. Prior UTIs 07/2022 and 11/2022, history of recurrent No recent UTIs or abx  courses. Known DM2  No concern for pyelo today (no systemic symptoms, neg fever, back pain, n/v).  Plan: 1. Urine dipstick positive 2. Ordered Urine culture, note recollected 3. Keflex '500mg'$  TID x 7 days - add diflucan 4. Improve PO hydration Follow up criteria Consider Urology or switch antibiotic if not improving by end of week  Meds ordered this encounter  Medications   cephALEXin (KEFLEX) 500 MG capsule    Sig: Take 1 capsule (500 mg total) by mouth 3 (three) times daily. For 7 days    Dispense:  21 capsule    Refill:  0   fluconazole (DIFLUCAN) 150 MG tablet    Sig: Take one tablet by mouth on Day 1. Repeat dose 2nd tablet on Day 3.    Dispense:  2 tablet    Refill:  0      Follow up plan: Return if symptoms worsen or fail to improve.   [name], [facility] 01/22/2023, 4:27 PM

## 2023-01-22 NOTE — Patient Instructions (Addendum)
Thank you for coming to the office today.  1. You have a Urinary Tract Infection - this is very common, your symptoms are reassuring and you should get better within 1 week on the antibiotics - Start Keflex '500mg'$  3 times daily for next 7 days, complete entire course, even if feeling better - We sent urine for a culture, we will call you within next few days if we need to change antibiotics - Please drink plenty of fluids, improve hydration over next 1 week  Add Diflucan Fluconzale if not improved  If symptoms worsening, developing nausea / vomiting, worsening back pain, fevers / chills / sweats, then please return for re-evaluation sooner.  If you take AZO OTC - limit this to 2-3 days MAX to avoid affecting kidneys  D-Mannose is a natural supplement that can actually help bind to urinary bacteria and reduce their effectiveness it can help prevent UTI from forming, and may reduce some symptoms. It likely cannot cure an active UTI but it is worth a try and good to prevent them with. Try '500mg'$  twice a day at a full dose if you want, or check package instructions for more info   Please schedule a Follow-up Appointment to: Return if symptoms worsen or fail to improve.  If you have any other questions or concerns, please feel free to call the office or send a message through [place]. You may also schedule an earlier appointment if necessary.  Additionally, you may be receiving a survey about your experience at our office within a few days to 1 week by e-mail or mail. We value your feedback.  [name], DO [place]

## 2023-01-23 ENCOUNTER — Ambulatory Visit: Payer: Medicare Other | Admitting: Obstetrics and Gynecology

## 2023-01-24 ENCOUNTER — Encounter: Payer: Self-pay | Admitting: Family Medicine

## 2023-01-24 LAB — URINE CULTURE
MICRO NUMBER:: 14526002
SPECIMEN QUALITY:: ADEQUATE

## 2023-01-28 NOTE — Progress Notes (Incomplete)
01/29/2023 10:35 AM   Amanda Webb 1971-02-14 [id number]  Referring provider: Olin Hauser, DO [address] [place],  [place] [address]  Urological history: 1.  Recurrent UTI's -Contributing factors of age, incontinence, diabetes, diarrhea and obesity -Documented urine cultures over the last year  January 22, 2019 for E. Coli  June 23, 2022 mixed urogenital flora  November 23, 2022 no growth  November 19, 2022 E. Coli  August 05, 2022 no growth  2.  Mixed incontinence -Contributing factors of age, cystocele, diabetes, obesity, depression, anxiety, spondylosis of lumbar spine, hypertension, lymphedema, diuretics, COPD and smoking history -PVR ***  3.  High risk hematuria -History of smoking -Incident of gross hematuria associated with UTI that resolved with treatment of UTI  4.  Renal cyst -CT (2016) -1.5 cm simple cyst in the upper pole of left kidney  No chief complaint on file.   HPI: Amanda Webb is a 52 y.o. female who presents today for UTI.    UA ***  PVR ***   PMH: Past Medical History:  Diagnosis Date   Allergy    Anemia    Anxiety    Arrhythmia    Arthritis    Cervical dysplasia    Cystitis    Depression    Dyspnea    GERD (gastroesophageal reflux disease)    Gross hematuria    Headache    Heart murmur    asymptomatic   History of methicillin resistant staphylococcus aureus (MRSA) 2013   HLD (hyperlipidemia)    Hypertension    Hypothyroid    Lymphedema    Palpitations    Pernicious anemia    Tobacco abuse    Type 2 diabetes mellitus ([place])     Surgical History: Past Surgical History:  Procedure Laterality Date   CARPAL TUNNEL RELEASE Bilateral 2011   FOOT SURGERY Right 2013   Plantar fascia   KNEE ARTHROSCOPY WITH MEDIAL MENISECTOMY Right 07/11/2021   Procedure: Right knee arthroscopic partial medial meniscectomy;  Surgeon: Leim Fabry, MD;  Location: [facility];  Service: Orthopedics;  Laterality: Right;    ROBOTIC ASSISTED LAPAROSCOPIC CHOLECYSTECTOMY  07/15/2020   Dr Christian Mate   TONSILLECTOMY  1979   with ear tubes    Home Medications:  Allergies as of 01/29/2023       Reactions   Bee Venom Anaphylaxis, Hives, Swelling   Carries Epi pen.   Cat Hair Extract Itching, Other (See Comments), Swelling   Allergic to trees, nuts, wheat, grass, cats & dogs - itchy watery eyes, swelling. Uses Zyrtec & Flonase & Benadryl if really bad. Used to get allergy shots. Allergic to trees, nuts, wheat, grass, cats & dogs - itchy watery eyes, swelling. Uses Zyrtec & Flonase & Benadryl if really bad. Used to get allergy shots.   Dog Epithelium Cough, Shortness Of Breath   Dog Epithelium Allergy Skin Test Shortness Of Breath   Dog Fennel Cough, Shortness Of Breath   Dog Fennel Allergy Skin Test Shortness Of Breath   Dust Mite Extract Cough, Shortness Of Breath   Tetracyclines & Related Nausea And Vomiting   Lactose Diarrhea, Nausea And Vomiting   Milk (cow) Diarrhea, Nausea And Vomiting   Milk Protein Diarrhea, Nausea And Vomiting   Milk-related Compounds Diarrhea, Nausea And Vomiting   Tape Other (See Comments), Rash   Needs to use paper tape. Breaks out with severe rash, pulls skin off when using adhesive.   Wound Dressing Adhesive Rash   Other reaction(s):  Other Needs to use paper tape. Breaks out with severe rash, pulls skin off when using adhesive. Needs to use paper tape. Breaks out with severe rash, pulls skin off when using adhesive. Needs to use paper tape. Breaks out with severe rash, pulls skin off when using adhesive.        Medication List        Accurate as of January 28, 2023 10:35 AM. If you have any questions, ask your nurse or doctor.          Accu-Chek FastClix Lancets Misc USE TO CHECK BLOOD SUGAR UP TO TWICE DAILY AS DIRECTED   Accu-Chek Guide test strip Generic drug: glucose blood CHECK BLOOD SUGAR UP TO EVERY DAY AS DIRECTED   albuterol 108 (90 Base) MCG/ACT  inhaler Commonly known as: VENTOLIN HFA INHALE 1 TO 2 PUFFS BY MOUTH EVERY 4 TO 6 HOURS AS NEEDED FOR SHORTNESS OF BREATH   albuterol (2.5 MG/3ML) 0.083% nebulizer solution Commonly known as: PROVENTIL Inhale 27m (one vial) via nebulized route every four hours around the clock for 24 hours, followed by one vial every 6 hours for the next 24 hours. After 48 hours, take 377mvia neb every 6-8 hours PRN cough, wheeze   ARIPiprazole 2 MG tablet Commonly known as: ABILIFY Take 2 mg by mouth every morning.   atenolol 50 MG tablet Commonly known as: TENORMIN TAKE 1 TABLET(50 MG) BY MOUTH DAILY   azelastine 0.1 % nasal spray Commonly known as: ASTELIN USE 1 SPRAY IN EACH NOSTRIL TWICE DAILY AS DIRECTED   B-12 COMPLIANCE INJECTION IJ Inject 1 Dose as directed every 30 (thirty) days.   cephALEXin 500 MG capsule Commonly known as: KEFLEX Take 1 capsule (500 mg total) by mouth 3 (three) times daily. For 7 days   Cetirizine HCl 10 MG Caps Take 10 mg by mouth daily with lunch.   fluconazole 150 MG tablet Commonly known as: DIFLUCAN Take one tablet by mouth on Day 1. Repeat dose 2nd tablet on Day 3.   fluticasone 50 MCG/ACT nasal spray Commonly known as: FLONASE SHAKE LIQUID AND USE 2 SPRAYS IN EACH NOSTRIL EVERY DAY   fluvoxaMINE 100 MG tablet Commonly known as: LUVOX Take 150 mg by mouth every morning.   gabapentin 100 MG capsule Commonly known as: NEURONTIN Take by mouth.   ketoconazole 2 % shampoo Commonly known as: NIZORAL APPLY EXTERNALLY 2 TIMES A WEEK   lamoTRIgine 200 MG tablet Commonly known as: LAMICTAL Take by mouth.   levothyroxine 50 MCG tablet Commonly known as: SYNTHROID TAKE 1 TABLET(50 MCG) BY MOUTH DAILY BEFORE BREAKFAST   medroxyPROGESTERone 150 MG/ML injection Commonly known as: DEPO-PROVERA BRING DEPO INJECTION TO OFFICE EACH VISIT   multivitamin with minerals Tabs tablet Take 1 tablet by mouth daily.   nystatin cream Commonly known as:  MYCOSTATIN Apply 1 application. topically 2 (two) times daily as needed (rash).   Ozempic (1 MG/DOSE) 4 MG/3ML Sopn Generic drug: Semaglutide (1 MG/DOSE) INJECT 1 MG UNDER THE SKIN ONE DAY A WEEK   pantoprazole 20 MG tablet Commonly known as: PROTONIX Take 1 tablet (20 mg) by mouth daily   Trelegy Ellipta 100-62.5-25 MCG/ACT Aepb Generic drug: Fluticasone-Umeclidin-Vilant Inhale 1 puff into the lungs daily.   tretinoin 0.025 % cream Commonly known as: RETIN-A APPLY EXTERNALLY TO THE AFFECTED AREA AT BEDTIME AS NEEDED        Allergies:  Allergies  Allergen Reactions   Bee Venom Anaphylaxis, Hives and Swelling    Carries Epi  pen.    Cat Hair Extract Itching, Other (See Comments) and Swelling    Allergic to trees, nuts, wheat, grass, cats & dogs - itchy watery eyes, swelling. Uses Zyrtec & Flonase & Benadryl if really bad. Used to get allergy shots. Allergic to trees, nuts, wheat, grass, cats & dogs - itchy watery eyes, swelling. Uses Zyrtec & Flonase & Benadryl if really bad. Used to get allergy shots.   Dog Epithelium Cough and Shortness Of Breath   Dog Epithelium Allergy Skin Test Shortness Of Breath   Dog Fennel Cough and Shortness Of Breath   Dog Fennel Allergy Skin Test Shortness Of Breath   Dust Mite Extract Cough and Shortness Of Breath   Tetracyclines & Related Nausea And Vomiting   Lactose Diarrhea and Nausea And Vomiting   Milk (Cow) Diarrhea and Nausea And Vomiting   Milk Protein Diarrhea and Nausea And Vomiting   Milk-Related Compounds Diarrhea and Nausea And Vomiting   Tape Other (See Comments) and Rash    Needs to use paper tape. Breaks out with severe rash, pulls skin off when using adhesive.   Wound Dressing Adhesive Rash    Other reaction(s): Other Needs to use paper tape. Breaks out with severe rash, pulls skin off when using adhesive.  Needs to use paper tape. Breaks out with severe rash, pulls skin off when using adhesive. Needs to use paper tape.  Breaks out with severe rash, pulls skin off when using adhesive.    Family History: Family History  Problem Relation Age of Onset   Depression Mother    Mental illness Mother    Alcohol abuse Mother    Lung cancer Mother    Cancer Maternal Grandmother    Diabetes Maternal Grandmother    Ovarian cancer Maternal Grandmother    Kidney disease Maternal Grandfather    Diabetes Father    Mental illness Father    Depression Father    Drug abuse Father    Depression Paternal Grandfather    Drug abuse Paternal Grandfather    Bladder Cancer Neg Hx    Breast cancer Neg Hx     Social History:  reports that she has been smoking cigarettes. She has a 32.00 pack-year smoking history. She has been exposed to tobacco smoke. She has never used smokeless tobacco. She reports current alcohol use of about 3.0 standard drinks of alcohol per week. She reports that she does not use drugs.  ROS: Pertinent ROS in HPI  Physical Exam: There were no vitals taken for this visit.  Constitutional:  Well nourished. Alert and oriented, No acute distress. HEENT: [place] AT, moist mucus membranes.  Trachea midline, no masses. Cardiovascular: No clubbing, cyanosis, or edema. Respiratory: Normal respiratory effort, no increased work of breathing. GU: No CVA tenderness.  No bladder fullness or masses. Vulvovaginal atrophy w/ pallor, loss of rugae, introital retraction, excoriations.  Vulvar thinning, fusion of labia, clitoral hood retraction, prominent urethral meatus.   *** external genitalia, *** pubic hair distribution, no lesions.  Normal urethral meatus, no lesions, no prolapse, no discharge.   No urethral masses, tenderness and/or tenderness. No bladder fullness, tenderness or masses. *** vagina mucosa, *** estrogen effect, no discharge, no lesions, *** pelvic support, *** cystocele and *** rectocele noted.  No cervical motion tenderness.  Uterus is freely mobile and non-fixed.  No adnexal/parametria masses or  tenderness noted.  Anus and perineum are without rashes or lesions.   ***  Neurologic: Grossly intact, no focal deficits, moving all 4  extremities. Psychiatric: Normal mood and affect.    Laboratory Data: Lab Results  Component Value Date   WBC 10.7 (H) 12/13/2022   HGB 14.6 12/13/2022   HCT 42.1 12/13/2022   MCV 91.3 12/13/2022   PLT 253 12/13/2022   Lab Results  Component Value Date   HGBA1C 6.6 (A) 10/19/2022    Urinalysis See epic and HPI I have reviewed the labs.   Pertinent Imaging: ***  Assessment & Plan:  ***  1.  Suspected UTI -UA *** -Urine sent for culture   No follow-ups on file.  These notes generated with voice recognition software. I apologize for typographical errors.  [place], [place] [address]  [place], [place] [address] [phone]

## 2023-01-29 ENCOUNTER — Ambulatory Visit: Payer: Medicare Other | Admitting: Urology

## 2023-01-29 DIAGNOSIS — R3989 Other symptoms and signs involving the genitourinary system: Secondary | ICD-10-CM

## 2023-01-30 ENCOUNTER — Other Ambulatory Visit: Payer: Self-pay

## 2023-01-30 ENCOUNTER — Encounter: Payer: Self-pay | Admitting: Family Medicine

## 2023-01-30 ENCOUNTER — Other Ambulatory Visit: Payer: Self-pay | Admitting: Family Medicine

## 2023-01-30 DIAGNOSIS — E1169 Type 2 diabetes mellitus with other specified complication: Secondary | ICD-10-CM

## 2023-01-30 MED ORDER — OZEMPIC (1 MG/DOSE) 4 MG/3ML ~~LOC~~ SOPN: PEN_INJECTOR | SUBCUTANEOUS | 1 refills | Status: DC

## 2023-02-04 ENCOUNTER — Telehealth: Payer: Medicare Other | Admitting: Family Medicine

## 2023-02-04 ENCOUNTER — Encounter: Payer: Self-pay | Admitting: Family Medicine

## 2023-02-04 DIAGNOSIS — B379 Candidiasis, unspecified: Secondary | ICD-10-CM

## 2023-02-04 DIAGNOSIS — N390 Urinary tract infection, site not specified: Secondary | ICD-10-CM

## 2023-02-04 MED ORDER — FLUCONAZOLE 150 MG PO TABS: ORAL_TABLET | ORAL | 0 refills | Status: DC

## 2023-02-04 MED ORDER — CIPROFLOXACIN HCL 500 MG PO TABS: 500.0000 mg | ORAL_TABLET | Freq: Two times a day (BID) | ORAL | 0 refills | Status: AC

## 2023-02-04 NOTE — Addendum Note (Signed)
Addended by: Olin Hauser on: 02/04/2023 09:49 AM   Modules accepted: Orders

## 2023-02-11 ENCOUNTER — Inpatient Hospital Stay: Payer: Medicare Other | Attending: Oncology

## 2023-02-15 ENCOUNTER — Inpatient Hospital Stay: Payer: Medicare Other | Attending: Oncology

## 2023-02-15 DIAGNOSIS — E538 Deficiency of other specified B group vitamins: Secondary | ICD-10-CM | POA: Insufficient documentation

## 2023-02-19 ENCOUNTER — Inpatient Hospital Stay: Payer: Medicare Other

## 2023-02-19 DIAGNOSIS — D509 Iron deficiency anemia, unspecified: Secondary | ICD-10-CM

## 2023-02-19 DIAGNOSIS — E538 Deficiency of other specified B group vitamins: Secondary | ICD-10-CM | POA: Diagnosis not present

## 2023-02-19 MED ORDER — CYANOCOBALAMIN 1000 MCG/ML IJ SOLN
1000.0000 ug | Freq: Once | INTRAMUSCULAR | Status: AC
  Administered 2023-02-19: 1000 ug via INTRAMUSCULAR
  Filled 2023-02-19: qty 1

## 2023-02-20 ENCOUNTER — Other Ambulatory Visit: Payer: Self-pay | Admitting: Oncology

## 2023-02-25 ENCOUNTER — Ambulatory Visit: Payer: Medicare Other

## 2023-02-27 ENCOUNTER — Other Ambulatory Visit (HOSPITAL_COMMUNITY)
Admission: RE | Admit: 2023-02-27 | Discharge: 2023-02-27 | Disposition: A | Discharge status: Home / Self Care | Payer: Medicare Other | Source: Ambulatory Visit | Attending: Obstetrics and Gynecology | Admitting: Obstetrics and Gynecology

## 2023-02-27 ENCOUNTER — Encounter: Payer: Self-pay | Admitting: Obstetrics and Gynecology

## 2023-02-27 ENCOUNTER — Ambulatory Visit (INDEPENDENT_AMBULATORY_CARE_PROVIDER_SITE_OTHER): Payer: Medicare Other | Admitting: Obstetrics and Gynecology

## 2023-02-27 VITALS — BP 148/90 | HR 99 | Ht 67.0 in | Wt 251.0 lb

## 2023-02-27 DIAGNOSIS — Z113 Encounter for screening for infections with a predominantly sexual mode of transmission: Secondary | ICD-10-CM | POA: Diagnosis not present

## 2023-02-27 DIAGNOSIS — Z1231 Encounter for screening mammogram for malignant neoplasm of breast: Secondary | ICD-10-CM

## 2023-02-27 DIAGNOSIS — N951 Menopausal and female climacteric states: Secondary | ICD-10-CM | POA: Diagnosis not present

## 2023-02-27 DIAGNOSIS — Z01419 Encounter for gynecological examination (general) (routine) without abnormal findings: Secondary | ICD-10-CM | POA: Insufficient documentation

## 2023-02-27 DIAGNOSIS — Z3483 Encounter for supervision of other normal pregnancy, third trimester: Secondary | ICD-10-CM

## 2023-02-27 DIAGNOSIS — Z124 Encounter for screening for malignant neoplasm of cervix: Secondary | ICD-10-CM

## 2023-02-27 DIAGNOSIS — Z1151 Encounter for screening for human papillomavirus (HPV): Secondary | ICD-10-CM | POA: Insufficient documentation

## 2023-02-27 DIAGNOSIS — Z3042 Encounter for surveillance of injectable contraceptive: Secondary | ICD-10-CM

## 2023-02-27 DIAGNOSIS — N3946 Mixed incontinence: Secondary | ICD-10-CM

## 2023-02-27 MED ORDER — MEDROXYPROGESTERONE ACETATE 150 MG/ML IM SUSP
150.0000 mg | Freq: Once | INTRAMUSCULAR | Status: AC
  Administered 2023-02-27: 150 mg via INTRAMUSCULAR

## 2023-02-27 NOTE — Progress Notes (Signed)
HPI:      [name] is a 52 y.o. [phone] who LMP was No LMP recorded. Patient has had an injection.  Subjective:   She presents today for her annual examination.  She states that she still has occasional mixed incontinence but has decided not to take the medication.  She states she is already on too many medications. She is due for her Depo-Provera and would like to continue on that. She states that she is having some hot flashes occasionally and believes she may be nearing menopause. Her partner has been "unfaithful" and she would like STD testing today.  She has no symptoms.  Reports no evidence of HSV or desire for testing.    Hx: The following portions of the patient's history were reviewed and updated as appropriate:             She  has a past medical history of Allergy, Anemia, Anxiety, Arrhythmia, Arthritis, Cervical dysplasia, Cystitis, Depression, Dyspnea, GERD (gastroesophageal reflux disease), Gross hematuria, Headache, Heart murmur, History of methicillin resistant staphylococcus aureus (MRSA) (2013), HLD (hyperlipidemia), Hypertension, Hypothyroid, Lymphedema, Palpitations, Pernicious anemia, Tobacco abuse, and Type 2 diabetes mellitus ([place]). She does not have any pertinent problems on file. She  has a past surgical history that includes Foot surgery (Right, 2013); Carpal tunnel release (Bilateral, 2011); Tonsillectomy (1979); Robotic assisted laparoscopic cholecystectomy (07/15/2020); and Knee arthroscopy with medial menisectomy (Right, 07/11/2021). Her family history includes Alcohol abuse in her mother; Cancer in her maternal grandmother; Depression in her father, mother, and paternal grandfather; Diabetes in her father and maternal grandmother; Drug abuse in her father and paternal grandfather; Kidney disease in her maternal grandfather; Lung cancer in her mother; Mental illness in her father and mother; Ovarian cancer in her maternal grandmother. She  reports that she has  been smoking cigarettes. She has a 32.00 pack-year smoking history. She has been exposed to tobacco smoke. She has never used smokeless tobacco. She reports current alcohol use of about 3.0 standard drinks of alcohol per week. She reports that she does not use drugs. She has a current medication list which includes the following prescription(s): accu-chek fastclix lancets, albuterol, albuterol, aripiprazole, atenolol, azelastine, cetirizine hcl, cyanocobalamin, fluconazole, fluticasone, fluvoxamine, gabapentin, accu-chek guide, ketoconazole, lamotrigine, levothyroxine, medroxyprogesterone, multivitamin with minerals, nystatin cream, ozempic (1 mg/dose), pantoprazole, trelegy ellipta, and tretinoin, and the following Facility-Administered Medications: medroxyprogesterone. She is allergic to bee venom, cat hair extract, dog epithelium, dog epithelium (canis lupus familiaris), dog fennel, dog fennel allergy skin test, dust mite extract, tetracyclines & related, lactose, milk (cow), milk protein, milk-related compounds, tape, and wound dressing adhesive.       Review of Systems:  Review of Systems  Constitutional: Denied constitutional symptoms, night sweats, recent illness, fatigue, fever, insomnia and weight loss.  Eyes: Denied eye symptoms, eye pain, photophobia, vision change and visual disturbance.  Ears/Nose/Throat/Neck: Denied ear, nose, throat or neck symptoms, hearing loss, nasal discharge, sinus congestion and sore throat.  Cardiovascular: Denied cardiovascular symptoms, arrhythmia, chest pain/pressure, edema, exercise intolerance, orthopnea and palpitations.  Respiratory: Denied pulmonary symptoms, asthma, pleuritic pain, productive sputum, cough, dyspnea and wheezing.  Gastrointestinal: Denied, gastro-esophageal reflux, melena, nausea and vomiting.  Genitourinary: Denied genitourinary symptoms including symptomatic vaginal discharge, pelvic relaxation issues, and urinary complaints.   Musculoskeletal: Denied musculoskeletal symptoms, stiffness, swelling, muscle weakness and myalgia.  Dermatologic: Denied dermatology symptoms, rash and scar.  Neurologic: Denied neurology symptoms, dizziness, headache, neck pain and syncope.  Psychiatric: Denied psychiatric symptoms, anxiety and depression.  Endocrine: See HPI for additional information.   Meds:   Current Outpatient Medications on File Prior to Visit  Medication Sig Dispense Refill   Accu-Chek FastClix Lancets MISC USE TO CHECK BLOOD SUGAR UP TO TWICE DAILY AS DIRECTED 102 each 5   albuterol (PROVENTIL) (2.5 MG/3ML) 0.083% nebulizer solution Inhale 75m (one vial) via nebulized route every four hours around the clock for 24 hours, followed by one vial every 6 hours for the next 24 hours. After 48 hours, take 352mvia neb every 6-8 hours PRN cough, wheeze 150 mL 1   albuterol (VENTOLIN HFA) 108 (90 Base) MCG/ACT inhaler INHALE 1 TO 2 PUFFS BY MOUTH EVERY 4 TO 6 HOURS AS NEEDED FOR SHORTNESS OF BREATH 18 g 2   ARIPiprazole (ABILIFY) 2 MG tablet Take 2 mg by mouth every morning.      atenolol (TENORMIN) 50 MG tablet TAKE 1 TABLET(50 MG) BY MOUTH DAILY 90 tablet 1   azelastine (ASTELIN) 0.1 % nasal spray USE 1 SPRAY IN EACH NOSTRIL TWICE DAILY AS DIRECTED 30 mL 5   Cetirizine HCl 10 MG CAPS Take 10 mg by mouth daily with lunch.      Cyanocobalamin (B-12 COMPLIANCE INJECTION IJ) Inject 1 Dose as directed every 30 (thirty) days.     fluconazole (DIFLUCAN) 150 MG tablet Take one tablet by mouth on Day 1. Repeat dose 2nd tablet on Day 3. 2 tablet 0   fluticasone (FLONASE) 50 MCG/ACT nasal spray SHAKE LIQUID AND USE 2 SPRAYS IN EACH NOSTRIL EVERY DAY 48 g 1   fluvoxaMINE (LUVOX) 100 MG tablet Take 150 mg by mouth every morning.      gabapentin (NEURONTIN) 100 MG capsule Take by mouth.     glucose blood (ACCU-CHEK GUIDE) test strip CHECK BLOOD SUGAR UP TO EVERY DAY AS DIRECTED 200 strip 5   ketoconazole (NIZORAL) 2 % shampoo APPLY  EXTERNALLY 2 TIMES A WEEK 120 mL 2   lamoTRIgine (LAMICTAL) 200 MG tablet Take by mouth.     levothyroxine (SYNTHROID) 50 MCG tablet TAKE 1 TABLET(50 MCG) BY MOUTH DAILY BEFORE BREAKFAST 90 tablet 1   medroxyPROGESTERone (DEPO-PROVERA) 150 MG/ML injection BRING DEPO INJECTION TO OFFICE EACH VISIT 1 mL 2   Multiple Vitamin (MULTIVITAMIN WITH MINERALS) TABS tablet Take 1 tablet by mouth daily.     nystatin cream (MYCOSTATIN) Apply 1 application. topically 2 (two) times daily as needed (rash). 30 g 5   OZEMPIC, 1 MG/DOSE, 4 MG/3ML SOPN INJECT 1 MG UNDER THE SKIN ONE DAY A WEEK 9 mL 1   pantoprazole (PROTONIX) 20 MG tablet Take 1 tablet (20 mg) by mouth daily 90 tablet 1   TRELEGY ELLIPTA 100-62.5-25 MCG/ACT AEPB Inhale 1 puff into the lungs daily. 180 each 3   tretinoin (RETIN-A) 0.025 % cream APPLY EXTERNALLY TO THE AFFECTED AREA AT BEDTIME AS NEEDED 45 g 2   Current Facility-Administered Medications on File Prior to Visit  Medication Dose Route Frequency Provider Last Rate Last Admin   medroxyPROGESTERone (DEPO-PROVERA) injection 150 mg  150 mg Intramuscular Q90 days EvHarlin HeysMD   150 mg at 01/29/22 0924     Objective:     Vitals:   02/27/23 0859 02/27/23 0916  BP: (!) 170/94 (!) 148/90  Pulse: (!) 101 99    Filed Weights   02/27/23 0859  Weight: 251 lb (113.9 kg)              Physical examination General NAD, Conversant  HEENT Atraumatic; Op  clear with mmm.  Normo-cephalic. Pupils reactive. Anicteric sclerae  Thyroid/Neck Smooth without nodularity or enlargement. Normal ROM.  Neck Supple.  Skin No rashes, lesions or ulceration. Normal palpated skin turgor. No nodularity.  Breasts: No masses or discharge.  Symmetric.  No axillary adenopathy.  Lungs: Clear to auscultation.No rales or wheezes. Normal Respiratory effort, no retractions.  Heart: NSR.  No murmurs or rubs appreciated. No peripheral edema  Abdomen: Soft.  Non-tender.  No masses.  No HSM. No hernia   Extremities: Moves all appropriately.  Normal ROM for age. No lymphadenopathy.  Neuro: Oriented to PPT.  Normal mood. Normal affect.     Pelvic:   Vulva: Normal appearance.  No lesions.  Vagina: No lesions or abnormalities noted.  Support: Second-degree cystocele second-degree rectocele  Urethra No masses tenderness or scarring.  Meatus Normal size without lesions or prolapse.  Cervix: Normal appearance.  No lesions.  Anus: Normal exam.  No lesions.  Perineum: Normal exam.  No lesions.        Bimanual   Uterus: Normal size.  Non-tender.  Mobile.  AV.  Adnexae: No masses.  Non-tender to palpation.  Cul-de-sac: Negative for abnormality.     Assessment:    [id number] Patient Active Problem List   Diagnosis Date Noted   Major depressive disorder, recurrent, in partial remission ([place]) 11/08/2020   Bronchitis 10/21/2020   Cough 10/21/2020   Acute non-recurrent sinusitis 08/24/2020   Status post laparoscopic cholecystectomy 07/28/2020   Morbid obesity ([place]) 07/05/2020   Hepatic steatosis 06/23/2020   Lymphedema 03/09/2020   Chronic venous insufficiency 03/09/2020   Bilateral lower extremity edema 02/16/2020   Female cystocele 10/21/2018   Urinary incontinence, mixed 10/21/2018   B12 deficiency 04/08/2018   Iron deficiency anemia 03/14/2018   GAD (generalized anxiety disorder) 03/10/2018   Acute right-sided low back pain 10/28/2017   Tobacco abuse 05/09/2017   Multiple thyroid nodules 12/28/2016   Simple chronic bronchitis ([place]) 12/13/2016   BMI 40.0-44.9, adult ([place]) 09/26/2016   HLD (hyperlipidemia) 09/26/2016   Vitamin D deficiency 09/26/2016   Type 2 diabetes mellitus with other specified complication ([place]) [id number]   Candidiasis of skin and nail 02/21/2015   Candidal intertrigo 02/21/2015   Cellulitis of right lower extremity 02/18/2015   Benign cyst of right kidney 01/17/2015   Chronic sinusitis 11/15/2014   Pneumonia due to infectious organism 11/02/2014    Whiplash injury 09/03/2014   Heart palpitations 04/02/2014   Palpitations 04/02/2014   Allergic rhinitis 12/31/2013   Spondylosis of lumbar region without myelopathy or radiculopathy 12/28/2013   Chronic low back pain 09/28/2013   Other fatigue 09/28/2013   Hypothyroidism 09/28/2013   Alteration of body temperature 09/28/2013   Other fatigue 09/28/2013   Plantar fasciitis, bilateral 07/10/2013   Tarsal tunnel syndrome of right side 07/10/2013   Plantar fascial fibromatosis 07/10/2013   Encounter for surveillance of injectable contraceptive 05/13/2013   Diuresis excessive 03/24/2013   Polyuria 03/24/2013   Abnormal uterine and vaginal bleeding, unspecified 03/24/2013   Arthritis, multiple joint involvement 03/17/2013   Fibroblastic disorder 07/28/2012   Borderline personality disorder ([place]) 12/17/2010   Borderline personality disorder in adult [facility]) 12/17/2010   Essential hypertension 02/18/2010   Carpal tunnel syndrome on both sides 12/17/2006   Major depressive disorder, single episode 02/21/1998   Major depressive disorder, single episode 02/21/1998   Disk prolapse 04/17/1995   Depression with anxiety 03/18/1995   OCD (obsessive compulsive disorder) 03/18/1995   Severe anxiety with panic 03/18/1995  1. Well woman exam with routine gynecological exam   2. Cervical cancer screening   3. Menopausal symptom   4. Encounter for management and injection of depo-Provera   5. Screening mammogram for breast cancer   6. Mixed incontinence   7. Screen for STD (sexually transmitted disease)     Patient with menopausal type symptoms   Plan:            1.  Basic Screening Recommendations The basic screening recommendations for asymptomatic women were discussed with the patient during her visit.  The age-appropriate recommendations were discussed with her and the rational for the tests reviewed.  When I am informed by the patient that another primary care physician has previously  obtained the age-appropriate tests and they are up-to-date, only outstanding tests are ordered and referrals given as necessary.  Abnormal results of tests will be discussed with her when all of her results are completed.  Routine preventative health maintenance measures emphasized: Exercise/Diet/Weight control, Tobacco Warnings, Alcohol/Substance use risks and Stress Management Pap performed -mammogram ordered -lab work 2.  STD testing 3.  Depo today 4.  [place] 5.  Recommend Cologuard or colonoscopy.  Orders Orders Placed This Encounter  Procedures   MM DIGITAL SCREENING BILATERAL   Basic metabolic panel   CBC   FSH   Hepatitis B surface antigen   Hepatitis C antibody   RPR     Meds ordered this encounter  Medications   medroxyPROGESTERone (DEPO-PROVERA) injection 150 mg          F/U  Return in 1 year (on 02/27/2024) for Depo Injection 5/29-6/12, Annual Physical.  Finis Bud, M.D. 02/27/2023 9:57 AM

## 2023-02-27 NOTE — Progress Notes (Signed)
Patients presents for annual exam today. She states experiencing menopausal symptoms such as hot flashes and night sweats. She reports continuing to experience incontinence. Patient is due for pap smear and mammogram, ordered. Annual labs and STD screening labs are ordered. She states no other questions or concerns at this time.   Last Annual: 02/27/23. Last pap: 02/27/23. Last Depo-Provera: 12/03/22. Side Effects if any: n/a. Serum HCG indicated? No . Depo-Provera 150 mg IM given by: Douglass Rivers, CMA. Site: Right [place] Due for next Depo injection 05/15/23-05/29/23

## 2023-02-28 LAB — BASIC METABOLIC PANEL
BUN/Creatinine Ratio: 6 — ABNORMAL LOW (ref 9–23)
BUN: 4 mg/dL — ABNORMAL LOW (ref 6–24)
CO2: 22 mmol/L (ref 20–29)
Calcium: 9 mg/dL (ref 8.7–10.2)
Chloride: 107 mmol/L — ABNORMAL HIGH (ref 96–106)
Creatinine, Ser: 0.62 mg/dL (ref 0.57–1.00)
Glucose: 173 mg/dL — ABNORMAL HIGH (ref 70–99)
Potassium: 3.8 mmol/L (ref 3.5–5.2)
Sodium: 148 mmol/L — ABNORMAL HIGH (ref 134–144)
eGFR: 108 mL/min/{1.73_m2} (ref >59)

## 2023-02-28 LAB — CERVICOVAGINAL ANCILLARY ONLY
Bacterial Vaginitis (gardnerella): NEGATIVE
Candida Glabrata: NEGATIVE
Candida Vaginitis: NEGATIVE
Chlamydia: NEGATIVE
Comment: NEGATIVE
Comment: NEGATIVE
Comment: NEGATIVE
Comment: NEGATIVE
Comment: NEGATIVE
Comment: NORMAL
Neisseria Gonorrhea: NEGATIVE
Trichomonas: NEGATIVE

## 2023-02-28 LAB — CBC
Hematocrit: 45.1 % (ref 34.0–46.6)
Hemoglobin: 15.3 g/dL (ref 11.1–15.9)
MCH: 31.4 pg (ref 26.6–33.0)
MCHC: 33.9 g/dL (ref 31.5–35.7)
MCV: 93 fL (ref 79–97)
Platelets: 248 10*3/uL (ref 150–450)
RBC: 4.87 x10E6/uL (ref 3.77–5.28)
RDW: 12.7 % (ref 11.7–15.4)
WBC: 8.3 10*3/uL (ref 3.4–10.8)

## 2023-02-28 LAB — CYTOLOGY - PAP
Comment: NEGATIVE
Diagnosis: NEGATIVE
High risk HPV: NEGATIVE

## 2023-02-28 LAB — FOLLICLE STIMULATING HORMONE: FSH: 20.4 m[IU]/mL

## 2023-02-28 LAB — HEPATITIS B SURFACE ANTIGEN: Hepatitis B Surface Ag: NEGATIVE

## 2023-02-28 LAB — RPR: RPR Ser Ql: NONREACTIVE

## 2023-02-28 LAB — HEPATITIS C ANTIBODY: Hep C Virus Ab: NONREACTIVE

## 2023-03-08 ENCOUNTER — Other Ambulatory Visit: Payer: Self-pay | Admitting: Family Medicine

## 2023-03-08 DIAGNOSIS — E039 Hypothyroidism, unspecified: Secondary | ICD-10-CM

## 2023-03-08 DIAGNOSIS — H40053 Ocular hypertension, bilateral: Secondary | ICD-10-CM | POA: Diagnosis not present

## 2023-03-08 DIAGNOSIS — H16223 Keratoconjunctivitis sicca, not specified as Sjogren's, bilateral: Secondary | ICD-10-CM | POA: Diagnosis not present

## 2023-03-08 DIAGNOSIS — I10 Essential (primary) hypertension: Secondary | ICD-10-CM | POA: Diagnosis not present

## 2023-03-08 DIAGNOSIS — J410 Simple chronic bronchitis: Secondary | ICD-10-CM

## 2023-03-08 DIAGNOSIS — E119 Type 2 diabetes mellitus without complications: Secondary | ICD-10-CM | POA: Diagnosis not present

## 2023-03-08 NOTE — Telephone Encounter (Signed)
Requested medication (s) are due for refill today: no  Requested medication (s) are on the active medication list: yes  Last refill:  multiple dates  Future visit scheduled: yes  Notes to clinic:   Medication not assigned to a protocol, review manually for Trelegy. No updated TSH results, routing for review     Requested Prescriptions  Pending Prescriptions Disp [place] 100-62.5-25 MCG/ACT AEPB [Pharmacy Med Name: TRELEGY ELLIPTA 100-62.5MCG INH 30P] 60 each     Sig: Inhale 1 puff into the lungs daily.     Off-Protocol Failed - 03/08/2023  6:27 AM      Failed - Medication not assigned to a protocol, review manually.      Passed - Valid encounter within last 12 months    Recent Outpatient Visits           1 month ago Recurrent UTI   [facility] [place], Devonne Doughty, DO   2 months ago Type 2 diabetes mellitus with other specified complication, without long-term current use of insulin [facility])   [place], DO   3 months ago Gross hematuria   [place], DO   4 months ago Type 2 diabetes mellitus with other specified complication, without long-term current use of insulin [facility])   [facility] [place], Devonne Doughty, DO   6 months ago Acute cystitis with hematuria   [place], DO               levothyroxine (SYNTHROID) 50 MCG tablet [Pharmacy Med Name: LEVOTHYROXINE 0.05MG  (50MCG) TAB] 90 tablet 1    Sig: TAKE 1 TABLET(50 MCG) BY MOUTH DAILY BEFORE BREAKFAST     Endocrinology:  Hypothyroid Agents Failed - 03/08/2023  6:27 AM      Failed - TSH in normal range and within 360 days    TSH  Date Value Ref Range Status  11/08/2020 2.28 mIU/L Final    Comment:              Reference Range .           > or = 20 Years  0.40-4.50 .                 Pregnancy Ranges           First trimester    0.26-2.66           Second trimester   0.55-2.73           Third trimester    0.43-2.91          Passed - Valid encounter within last 12 months    Recent Outpatient Visits           1 month ago Recurrent UTI   [facility] [place], Devonne Doughty, DO   2 months ago Type 2 diabetes mellitus with other specified complication, without long-term current use of insulin [facility])   [place], DO   3 months ago Gross hematuria   [place], DO   4 months ago Type 2 diabetes mellitus with other specified complication, without long-term current use of insulin [facility])   [place], DO   6 months ago Acute cystitis with  hematuria   [place], [place]

## 2023-03-12 ENCOUNTER — Inpatient Hospital Stay: Payer: Medicare Other

## 2023-03-14 DIAGNOSIS — Z20818 Contact with and (suspected) exposure to other bacterial communicable diseases: Secondary | ICD-10-CM | POA: Diagnosis not present

## 2023-03-14 DIAGNOSIS — J029 Acute pharyngitis, unspecified: Secondary | ICD-10-CM | POA: Diagnosis not present

## 2023-03-22 ENCOUNTER — Inpatient Hospital Stay: Payer: Medicare Other | Attending: Oncology

## 2023-03-22 DIAGNOSIS — E538 Deficiency of other specified B group vitamins: Secondary | ICD-10-CM | POA: Insufficient documentation

## 2023-03-27 ENCOUNTER — Inpatient Hospital Stay: Payer: Medicare Other

## 2023-03-27 DIAGNOSIS — E538 Deficiency of other specified B group vitamins: Secondary | ICD-10-CM | POA: Diagnosis not present

## 2023-03-27 DIAGNOSIS — D509 Iron deficiency anemia, unspecified: Secondary | ICD-10-CM

## 2023-03-27 MED ORDER — CYANOCOBALAMIN 1000 MCG/ML IJ SOLN
1000.0000 ug | INTRAMUSCULAR | Status: DC
  Administered 2023-03-27: 1000 ug via INTRAMUSCULAR
  Filled 2023-03-27: qty 1

## 2023-03-28 DIAGNOSIS — F4310 Post-traumatic stress disorder, unspecified: Secondary | ICD-10-CM | POA: Diagnosis not present

## 2023-04-12 ENCOUNTER — Inpatient Hospital Stay: Payer: Medicare Other

## 2023-04-22 ENCOUNTER — Inpatient Hospital Stay: Payer: Medicare Other | Attending: Oncology

## 2023-04-22 DIAGNOSIS — E538 Deficiency of other specified B group vitamins: Secondary | ICD-10-CM | POA: Insufficient documentation

## 2023-04-23 ENCOUNTER — Inpatient Hospital Stay: Payer: Medicare Other

## 2023-04-23 DIAGNOSIS — D509 Iron deficiency anemia, unspecified: Secondary | ICD-10-CM

## 2023-04-23 DIAGNOSIS — E538 Deficiency of other specified B group vitamins: Secondary | ICD-10-CM | POA: Diagnosis not present

## 2023-04-23 MED ORDER — CYANOCOBALAMIN 1000 MCG/ML IJ SOLN
1000.0000 ug | INTRAMUSCULAR | Status: DC
  Administered 2023-04-23: 1000 ug via INTRAMUSCULAR
  Filled 2023-04-23: qty 1

## 2023-05-01 ENCOUNTER — Encounter: Payer: Self-pay | Admitting: Family Medicine

## 2023-05-01 DIAGNOSIS — E1169 Type 2 diabetes mellitus with other specified complication: Secondary | ICD-10-CM

## 2023-05-01 MED ORDER — MOUNJARO 5 MG/0.5ML ~~LOC~~ SOAJ
5.0000 mg | SUBCUTANEOUS | 2 refills | Status: DC
Start: 2023-05-01 — End: 2024-01-06

## 2023-05-06 NOTE — Progress Notes (Signed)
(  Key: [id number]) PA Case ID #: [id number] Rx #: [id number] Need Help? Call us at [phone] Outcome Approved today Approved. This drug has been approved under the Member's Medicare Part D benefit. Approved quantity: 2 units per 28 day(s). You may fill up to a 90 day supply except for those on Specialty Tier 5, which can be filled up to a 30 day supply. Please call the pharmacy to process the prescription claim. Authorization Expiration Date: 12/16/2098 Drug Mounjaro 5MG /0.5ML pen-injectors ePA cloud logo Form [facility] Medicare Electronic Prior Authorization Request Form [phone] NCPDP) Original Claim Info [id number]

## 2023-05-06 NOTE — Progress Notes (Signed)
(  Key: [id number] Arbour has not yet replied to your PA request. You may close this dialog, return to your dashboard, and perform other tasks.  To check for an update later, open this request again from your dashboard.  If [facility] has not replied to your request within 24 hours for urgent requests or 72 hours for standard requests, please reach out to the plan using the phone number located on the back on the [employer] card.

## 2023-05-14 ENCOUNTER — Inpatient Hospital Stay: Payer: Medicare Other

## 2023-05-15 ENCOUNTER — Encounter: Payer: Self-pay | Admitting: Urgent Care

## 2023-05-15 ENCOUNTER — Ambulatory Visit (INDEPENDENT_AMBULATORY_CARE_PROVIDER_SITE_OTHER): Payer: Medicare Other

## 2023-05-15 VITALS — BP 125/83 | HR 86 | Ht 67.0 in | Wt 241.0 lb

## 2023-05-15 DIAGNOSIS — Z3042 Encounter for surveillance of injectable contraceptive: Secondary | ICD-10-CM | POA: Diagnosis not present

## 2023-05-15 MED ORDER — MEDROXYPROGESTERONE ACETATE 150 MG/ML IM SUSP
150.0000 mg | Freq: Once | INTRAMUSCULAR | Status: AC
Start: 2023-05-15 — End: 2023-05-15
  Administered 2023-05-15: 150 mg via INTRAMUSCULAR

## 2023-05-15 NOTE — Progress Notes (Signed)
    NURSE VISIT NOTE  Subjective:    Patient ID: Amanda Webb, female    DOB: August 12, 1971, 52 y.o.   MRN: [id number]  HPI  Patient is a 52 y.o. [id number] female who presents for depo provera injection.   Objective:    BP 125/83   Pulse 86   Ht 5\' 7"  (1.702 m)   Wt 241 lb (109.3 kg)   BMI 37.75 kg/m   Last Annual: 02/27/23. Last pap: 02/27/23. Last Depo-Provera: 02/27/23. Side Effects if any: none Serum HCG indicated? No . Depo-Provera 150 mg IM given by: Cornelius Moras, CMA. Site: Left Deltoid  Lab Review  @THIS  VISIT ONLY@  Assessment:   1. Encounter for Depo-Provera contraception      Plan:   Next appointment due between Aug 14 and Aug 14, 2023.    Cornelius Moras, CMA

## 2023-05-15 NOTE — Patient Instructions (Signed)
Contraceptive Injection A contraceptive injection is a shot that prevents pregnancy. It is also called a birth control shot. The shot contains the hormone progestin, which prevents pregnancy by: Stopping the ovaries from releasing eggs. Thickening cervical mucus to prevent sperm from entering the cervix. Thinning the lining of the uterus to prevent a fertilized egg from attaching to the uterus. Contraceptive injections are given under the skin (subcutaneous) or into a muscle (intramuscular). For these shots to work, you must get one of them every 3 months (12-13 weeks) from a health care provider. Tell a health care provider about: Any allergies you have. All medicines you are taking, including vitamins, herbs, eye drops, creams, and over-the-counter medicines. Any blood disorders you have. Any medical conditions you have. Whether you are pregnant or may be pregnant. What are the risks? Generally, this is a safe procedure. However, problems may occur, including: Mood changes or depression. Loss of bone density (osteoporosis) after long-term use. Blood clots. This is rare. Higher risk of an egg being fertilized outside your uterus (ectopic pregnancy).This is rare. What happens before the procedure? Your health care provider may do a routine physical exam. You may have a test to make sure you are not pregnant. What happens during the procedure?  The area where the shot will be given will be cleaned and sanitized with alcohol. A needle will be inserted into a muscle in your upper arm or buttock, or into the skin of your thigh or abdomen. The needle will be attached to a syringe with the medicine inside of it. The medicine will be pushed through the syringe and injected into your body. A small bandage (dressing) may be placed over the injection site. What can I expect after the procedure? After the procedure, it is common to have: Soreness around the injection site for a couple of  days. Irregular menstrual bleeding. Weight gain. Breast tenderness. Headaches. Discomfort in your abdomen. Ask your health care provider whether you need to use an added method of birth control (backup contraception), such as a condom, sponge, or spermicide. If the first shot is given 1-7 days after the start of your last menstrual period, you will not need backup contraception. If the first shot is given at any other time during your menstrual cycle, you should avoid having sex. If you do have sex, you will need to use backup contraception for 7 days after you receive the shot. Follow these instructions at home: General instructions Take over-the-counter and prescription medicines only as told by your health care provider. Do not rub or massage the injection site. Track your menstrual periods so you will know if they become irregular. Always use a condom to protect against sexually transmitted infections (STIs). Make sure you schedule an appointment in time for your next shot and mark it on your calendar. You must get an injection every 3 months (12-13 weeks) to prevent pregnancy. Lifestyle Do not use any products that contain nicotine or tobacco. These products include cigarettes, chewing tobacco, and vaping devices, such as e-cigarettes. If you need help quitting, ask your health care provider. Eat foods that are high in calcium and vitamin D, such as milk, cheese, and salmon. Doing this may help with any loss in bone density caused by the contraceptive injection. Ask your health care provider for dietary recommendations. Contact a health care provider if you: Have nausea or vomiting. Have abnormal vaginal discharge or bleeding. Miss a menstrual period or think you might be pregnant. Experience mood changes   or depression. Feel dizzy or light-headed. Have leg pain. Get help right away if you: Have chest pain or cough up blood. Have shortness of breath. Have a severe headache that does  not go away. Have numbness in any part of your body. Have slurred speech or vision problems. Have vaginal bleeding that is abnormally heavy or does not stop, or you have severe pain in your abdomen. Have depression that does not get better with treatment. If you ever feel like you may hurt yourself or others, or have thoughts about taking your own life, get help right away. Go to your nearest emergency department or: Call your local emergency services (911 in the [place] Call a suicide crisis helpline, such as the National Suicide Prevention Lifeline at 1-800-273-8255 or 988 in the [place] This is open 24 hours a day in the [place] Text the Crisis Text Line at 741741 (in the [place] Summary A contraceptive injection is a shot that prevents pregnancy. It is also called the birth control shot. The shot is given under the skin (subcutaneous) or into a muscle (intramuscular). After this procedure, it is common to have soreness around the injection site for a couple of days. To prevent pregnancy, the shot must be given by a health care provider every 3 months (12-13 weeks). After you have the shot, ask your health care provider whether you need to use an added method of birth control (backup contraception), such as a condom, sponge, or spermicide. This information is not intended to replace advice given to you by your health care provider. Make sure you discuss any questions you have with your health care provider. Document Revised: 06/28/2021 Document Reviewed: 06/13/2020 Elsevier Patient Education  2024 [employer]  

## 2023-05-21 ENCOUNTER — Inpatient Hospital Stay: Payer: Medicare Other | Attending: Oncology

## 2023-05-21 DIAGNOSIS — E538 Deficiency of other specified B group vitamins: Secondary | ICD-10-CM | POA: Insufficient documentation

## 2023-05-21 DIAGNOSIS — D509 Iron deficiency anemia, unspecified: Secondary | ICD-10-CM

## 2023-05-21 MED ORDER — CYANOCOBALAMIN 1000 MCG/ML IJ SOLN
1000.0000 ug | INTRAMUSCULAR | Status: DC
  Administered 2023-05-21: 1000 ug via INTRAMUSCULAR
  Filled 2023-05-21: qty 1

## 2023-06-03 ENCOUNTER — Other Ambulatory Visit: Payer: Self-pay | Admitting: Family Medicine

## 2023-06-03 DIAGNOSIS — J309 Allergic rhinitis, unspecified: Secondary | ICD-10-CM

## 2023-06-03 NOTE — Telephone Encounter (Signed)
Requested Prescriptions  Pending Prescriptions Disp Refills   fluticasone (FLONASE) 50 MCG/ACT nasal spray [Pharmacy Med Name: FLUTICASONE NASAL SP (120) RX] 48 g 0    Sig: SHAKE LIQUID AND USE 2 SPRAYS IN EACH NOSTRIL EVERY DAY     Ear, Nose, and Throat: Nasal Preparations - Corticosteroids Passed - 06/03/2023  8:14 AM      Passed - Valid encounter within last 12 months    Recent Outpatient Visits           4 months ago Recurrent UTI   [facility] [place], Netta Neat, DO   5 months ago Type 2 diabetes mellitus with other specified complication, without long-term current use of insulin [facility])   [place] [facility] [place], Netta Neat, DO   6 months ago Gross hematuria   [place] [facility] [place], Netta Neat, DO   7 months ago Type 2 diabetes mellitus with other specified complication, without long-term current use of insulin [facility])   [place] [facility] Smitty Cords, DO   9 months ago Acute cystitis with hematuria   [facility] [place], Netta Neat, [place]

## 2023-06-11 ENCOUNTER — Encounter: Payer: Self-pay | Admitting: Family Medicine

## 2023-06-11 ENCOUNTER — Other Ambulatory Visit: Payer: Self-pay | Admitting: Family Medicine

## 2023-06-11 DIAGNOSIS — K219 Gastro-esophageal reflux disease without esophagitis: Secondary | ICD-10-CM

## 2023-06-11 MED ORDER — PANTOPRAZOLE SODIUM 20 MG PO TBEC
DELAYED_RELEASE_TABLET | ORAL | 1 refills | Status: DC
Start: 2023-06-11 — End: 2023-09-23

## 2023-06-12 NOTE — Telephone Encounter (Signed)
Requested Prescriptions  Pending Prescriptions Disp Refills   pantoprazole (PROTONIX) 20 MG tablet [Pharmacy Med Name: PANTOPRAZOLE 20MG  TABLETS] 90 tablet 1    Sig: TAKE 1 TABLET(20 MG) BY MOUTH DAILY     Gastroenterology: Proton Pump Inhibitors Passed - 06/11/2023  9:30 AM      Passed - Valid encounter within last 12 months    Recent Outpatient Visits           4 months ago Recurrent UTI   [facility] [place], Netta Neat, DO   5 months ago Type 2 diabetes mellitus with other specified complication, without long-term current use of insulin [facility])   [place] [facility] [place], Netta Neat, DO   6 months ago Gross hematuria   [facility] [place], Netta Neat, DO   7 months ago Type 2 diabetes mellitus with other specified complication, without long-term current use of insulin [facility])   [place] [facility] Smitty Cords, DO   10 months ago Acute cystitis with hematuria   [facility] [place], Netta Neat, [place]

## 2023-06-20 ENCOUNTER — Encounter: Payer: Self-pay | Admitting: Nurse Practitioner

## 2023-06-20 ENCOUNTER — Telehealth: Payer: Medicare Other | Admitting: Nurse Practitioner

## 2023-06-20 DIAGNOSIS — R110 Nausea: Secondary | ICD-10-CM | POA: Diagnosis not present

## 2023-06-20 DIAGNOSIS — J029 Acute pharyngitis, unspecified: Secondary | ICD-10-CM

## 2023-06-20 MED ORDER — AMOXICILLIN 500 MG PO CAPS
500.0000 mg | ORAL_CAPSULE | Freq: Two times a day (BID) | ORAL | 0 refills | Status: AC
Start: 2023-06-20 — End: 2023-06-30

## 2023-06-20 MED ORDER — ONDANSETRON 4 MG PO TBDP
4.0000 mg | ORAL_TABLET | Freq: Three times a day (TID) | ORAL | 0 refills | Status: DC | PRN
Start: 2023-06-20 — End: 2024-01-06

## 2023-06-20 NOTE — Progress Notes (Signed)
Virtual Visit Consent   Amanda Webb, you are scheduled for a virtual visit with a [facility] provider today. Just as with appointments in the office, your consent must be obtained to participate. Your consent will be active for this visit and any virtual visit you may have with one of our providers in the next 365 days. If you have a MyChart account, a copy of this consent can be sent to you electronically.  As this is a virtual visit, video technology does not allow for your provider to perform a traditional examination. This may limit your provider's ability to fully assess your condition. If your provider identifies any concerns that need to be evaluated in person or the need to arrange testing (such as labs, EKG, etc.), we will make arrangements to do so. Although advances in technology are sophisticated, we cannot ensure that it will always work on either your end or our end. If the connection with a video visit is poor, the visit may have to be switched to a telephone visit. With either a video or telephone visit, we are not always able to ensure that we have a secure connection.  By engaging in this virtual visit, you consent to the provision of healthcare and authorize for your insurance to be billed (if applicable) for the services provided during this visit. Depending on your insurance coverage, you may receive a charge related to this service.  I need to obtain your verbal consent now. Are you willing to proceed with your visit today? Amanda Webb has provided verbal consent on 06/20/2023 for a virtual visit (video or telephone). [name], FNP  Date: 06/20/2023 8:46 AM  Virtual Visit via Video Note   I, [name], connected with  Amanda Webb  ([id number], 07/25/1971) on 06/20/23 at  9:00 AM EDT by a video-enabled telemedicine application and verified that I am speaking with the correct person using two identifiers.  Location: Patient: Virtual Visit Location Patient:  Home Provider: Virtual Visit Location Provider: Home Office   I discussed the limitations of evaluation and management by telemedicine and the availability of in person appointments. The patient expressed understanding and agreed to proceed.    History of Present Illness: Amanda Webb is a 52 y.o. who identifies as a female who was assigned female at birth, and is being seen today with complaints of a sore throat for the past 3 days- more so on the right side  Yesterday she started vomiting  Throughout the day she had the feeling of a hangover  In the middle of last night she woke up with a headache  She is now having some pain in her right ear on the same side of her throat that hurts  She is having chills today  Low grade fever as well  She does stay with her grandson but denies any known sick contacts  She feels that it hurts when she swallows   Denies any recent travel   She has had COVID a few years ago  Home COVID test today negative   Problems:  Patient Active Problem List   Diagnosis Date Noted   Major depressive disorder, recurrent, in partial remission (HCC) 11/08/2020   Bronchitis 10/21/2020   Cough 10/21/2020   Acute non-recurrent sinusitis 08/24/2020   Status post laparoscopic cholecystectomy 07/28/2020   Morbid obesity (HCC) 07/05/2020   Hepatic steatosis 06/23/2020   Lymphedema 03/09/2020   Chronic venous insufficiency 03/09/2020   Bilateral lower extremity edema 02/16/2020  Female cystocele 10/21/2018   Urinary incontinence, mixed 10/21/2018   B12 deficiency 04/08/2018   Iron deficiency anemia 03/14/2018   GAD (generalized anxiety disorder) 03/10/2018   Acute right-sided low back pain 10/28/2017   Tobacco abuse 05/09/2017   Multiple thyroid nodules 12/28/2016   Simple chronic bronchitis (HCC) 12/13/2016   BMI 40.0-44.9, adult (HCC) 09/26/2016   HLD (hyperlipidemia) 09/26/2016   Vitamin D deficiency 09/26/2016   Type 2 diabetes mellitus with other  specified complication (HCC) 06/17/2015   Candidiasis of skin and nail 02/21/2015   Candidal intertrigo 02/21/2015   Cellulitis of right lower extremity 02/18/2015   Benign cyst of right kidney 01/17/2015   Chronic sinusitis 11/15/2014   Pneumonia due to infectious organism 11/02/2014   Whiplash injury 09/03/2014   Heart palpitations 04/02/2014   Palpitations 04/02/2014   Allergic rhinitis 12/31/2013   Spondylosis of lumbar region without myelopathy or radiculopathy 12/28/2013   Chronic low back pain 09/28/2013   Other fatigue 09/28/2013   Hypothyroidism 09/28/2013   Alteration of body temperature 09/28/2013   Other fatigue 09/28/2013   Plantar fasciitis, bilateral 07/10/2013   Tarsal tunnel syndrome of right side 07/10/2013   Plantar fascial fibromatosis 07/10/2013   Encounter for surveillance of injectable contraceptive 05/13/2013   Diuresis excessive 03/24/2013   Polyuria 03/24/2013   Abnormal uterine and vaginal bleeding, unspecified 03/24/2013   Arthritis, multiple joint involvement 03/17/2013   Fibroblastic disorder 07/28/2012   Borderline personality disorder (HCC) 12/17/2010   Borderline personality disorder in adult [facility]) 12/17/2010   Essential hypertension 02/18/2010   Carpal tunnel syndrome on both sides 12/17/2006   Major depressive disorder, single episode 02/21/1998   Major depressive disorder, single episode 02/21/1998   Disk prolapse 04/17/1995   Depression with anxiety 03/18/1995   OCD (obsessive compulsive disorder) 03/18/1995   Severe anxiety with panic 03/18/1995    Allergies:  Allergies  Allergen Reactions   Bee Venom Anaphylaxis, Hives and Swelling    Carries Epi pen.    Cat Hair Extract Itching, Other (See Comments) and Swelling    Allergic to trees, nuts, wheat, grass, cats & dogs - itchy watery eyes, swelling. Uses Zyrtec & Flonase & Benadryl if really bad. Used to get allergy shots. Allergic to trees, nuts, wheat, grass, cats & dogs - itchy  watery eyes, swelling. Uses Zyrtec & Flonase & Benadryl if really bad. Used to get allergy shots.   Dog Epithelium Cough and Shortness Of Breath   Dog Epithelium (Canis Lupus Familiaris) Shortness Of Breath   Dog Fennel Cough and Shortness Of Breath   Dog Fennel Allergy Skin Test Shortness Of Breath   Dust Mite Extract Cough and Shortness Of Breath   Tetracyclines & Related Nausea And Vomiting   Lactose Diarrhea and Nausea And Vomiting   Milk (Cow) Diarrhea and Nausea And Vomiting   Milk Protein Diarrhea and Nausea And Vomiting   Milk-Related Compounds Diarrhea and Nausea And Vomiting   Tape Other (See Comments) and Rash    Needs to use paper tape. Breaks out with severe rash, pulls skin off when using adhesive.   Wound Dressing Adhesive Rash    Other reaction(s): Other Needs to use paper tape. Breaks out with severe rash, pulls skin off when using adhesive.  Needs to use paper tape. Breaks out with severe rash, pulls skin off when using adhesive. Needs to use paper tape. Breaks out with severe rash, pulls skin off when using adhesive.   Medications:  Current Outpatient Medications:    Accu-Chek  FastClix Lancets MISC, USE TO CHECK BLOOD SUGAR UP TO TWICE DAILY AS DIRECTED, Disp: 102 each, Rfl: 5   albuterol (PROVENTIL) (2.5 MG/3ML) 0.083% nebulizer solution, Inhale 3ml (one vial) via nebulized route every four hours around the clock for 24 hours, followed by one vial every 6 hours for the next 24 hours. After 48 hours, take 3mL via neb every 6-8 hours PRN cough, wheeze, Disp: 150 mL, Rfl: 1   albuterol (VENTOLIN HFA) 108 (90 Base) MCG/ACT inhaler, INHALE 1 TO 2 PUFFS BY MOUTH EVERY 4 TO 6 HOURS AS NEEDED FOR SHORTNESS OF BREATH, Disp: 18 g, Rfl: 2   ARIPiprazole (ABILIFY) 2 MG tablet, Take 2 mg by mouth every morning. , Disp: , Rfl:    atenolol (TENORMIN) 50 MG tablet, TAKE 1 TABLET(50 MG) BY MOUTH DAILY, Disp: 90 tablet, Rfl: 1   azelastine (ASTELIN) 0.1 % nasal spray, USE 1 SPRAY IN EACH  NOSTRIL TWICE DAILY AS DIRECTED, Disp: 30 mL, Rfl: 5   Cetirizine HCl 10 MG CAPS, Take 10 mg by mouth daily with lunch. , Disp: , Rfl:    Cyanocobalamin (B-12 COMPLIANCE INJECTION IJ), Inject 1 Dose as directed every 30 (thirty) days., Disp: , Rfl:    fluconazole (DIFLUCAN) 150 MG tablet, Take one tablet by mouth on Day 1. Repeat dose 2nd tablet on Day 3., Disp: 2 tablet, Rfl: 0   fluticasone (FLONASE) 50 MCG/ACT nasal spray, SHAKE LIQUID AND USE 2 SPRAYS IN EACH NOSTRIL EVERY DAY, Disp: 48 g, Rfl: 0   fluvoxaMINE (LUVOX) 100 MG tablet, Take 150 mg by mouth every morning. , Disp: , Rfl:    gabapentin (NEURONTIN) 100 MG capsule, Take by mouth., Disp: , Rfl:    glucose blood (ACCU-CHEK GUIDE) test strip, CHECK BLOOD SUGAR UP TO EVERY DAY AS DIRECTED, Disp: 200 strip, Rfl: 5   ketoconazole (NIZORAL) 2 % shampoo, APPLY EXTERNALLY 2 TIMES A WEEK, Disp: 120 mL, Rfl: 2   lamoTRIgine (LAMICTAL) 200 MG tablet, Take by mouth., Disp: , Rfl:    levothyroxine (SYNTHROID) 50 MCG tablet, TAKE 1 TABLET(50 MCG) BY MOUTH DAILY BEFORE BREAKFAST, Disp: 90 tablet, Rfl: 1   medroxyPROGESTERone (DEPO-PROVERA) 150 MG/ML injection, BRING DEPO INJECTION TO OFFICE EACH VISIT, Disp: 1 mL, Rfl: 2   MOUNJARO 5 MG/0.5ML Pen, Inject 5 mg into the skin once a week., Disp: 2 mL, Rfl: 2   Multiple Vitamin (MULTIVITAMIN WITH MINERALS) TABS tablet, Take 1 tablet by mouth daily., Disp: , Rfl:    nystatin cream (MYCOSTATIN), Apply 1 application. topically 2 (two) times daily as needed (rash)., Disp: 30 g, Rfl: 5   pantoprazole (PROTONIX) 20 MG tablet, Take 1 tablet (20 mg) by mouth daily, Disp: 90 tablet, Rfl: 1   TRELEGY ELLIPTA 100-62.5-25 MCG/ACT AEPB, INHALE 1 PUFF INTO THE LUNGS DAILY, Disp: 180 each, Rfl: 1   tretinoin (RETIN-A) 0.025 % cream, APPLY EXTERNALLY TO THE AFFECTED AREA AT BEDTIME AS NEEDED, Disp: 45 g, Rfl: 2  Observations/Objective: Patient is well-developed, well-nourished in no acute distress.  Resting  comfortably  at home.  Head is normocephalic, atraumatic.  No labored breathing.  Speech is clear and coherent with logical content.  Patient is alert and oriented at baseline.    Assessment and Plan: 1. Pharyngitis, unspecified etiology Cover for strep based on [facility] criteria  - amoxicillin (AMOXIL) 500 MG capsule; Take 1 capsule (500 mg total) by mouth 2 (two) times daily for 10 days.  Dispense: 20 capsule; Refill: 0  2. Nausea  -  ondansetron (ZOFRAN-ODT) 4 MG disintegrating tablet; Take 1 tablet (4 mg total) by mouth every 8 (eight) hours as needed for nausea or vomiting.  Dispense: 20 tablet; Refill: 0     Follow Up Instructions: I discussed the assessment and treatment plan with the patient. The patient was provided an opportunity to ask questions and all were answered. The patient agreed with the plan and demonstrated an understanding of the instructions.  A copy of instructions were sent to the patient via MyChart unless otherwise noted below.    The patient was advised to call back or seek an in-person evaluation if the symptoms worsen or if the condition fails to improve as anticipated.  Time:  I spent 12 minutes with the patient via telehealth technology discussing the above problems/concerns.    [name], FNP

## 2023-06-21 ENCOUNTER — Encounter: Payer: Self-pay | Admitting: Family Medicine

## 2023-06-21 ENCOUNTER — Telehealth: Payer: Medicare Other | Admitting: Family Medicine

## 2023-06-24 ENCOUNTER — Inpatient Hospital Stay: Payer: Medicare Other

## 2023-06-24 ENCOUNTER — Inpatient Hospital Stay: Payer: Medicare Other | Attending: Oncology

## 2023-06-24 ENCOUNTER — Inpatient Hospital Stay: Payer: Medicare Other | Admitting: Oncology

## 2023-06-24 DIAGNOSIS — E538 Deficiency of other specified B group vitamins: Secondary | ICD-10-CM | POA: Insufficient documentation

## 2023-07-04 ENCOUNTER — Inpatient Hospital Stay: Payer: Medicare Other

## 2023-07-09 ENCOUNTER — Inpatient Hospital Stay: Payer: Medicare Other

## 2023-07-09 DIAGNOSIS — D509 Iron deficiency anemia, unspecified: Secondary | ICD-10-CM

## 2023-07-09 DIAGNOSIS — E538 Deficiency of other specified B group vitamins: Secondary | ICD-10-CM | POA: Diagnosis not present

## 2023-07-09 MED ORDER — CYANOCOBALAMIN 1000 MCG/ML IJ SOLN
1000.0000 ug | INTRAMUSCULAR | Status: DC
  Administered 2023-07-09: 1000 ug via INTRAMUSCULAR
  Filled 2023-07-09: qty 1

## 2023-07-19 ENCOUNTER — Inpatient Hospital Stay: Payer: Medicare Other | Attending: Oncology

## 2023-07-21 ENCOUNTER — Other Ambulatory Visit: Payer: Self-pay | Admitting: *Deleted

## 2023-07-21 DIAGNOSIS — E538 Deficiency of other specified B group vitamins: Secondary | ICD-10-CM

## 2023-07-21 DIAGNOSIS — D509 Iron deficiency anemia, unspecified: Secondary | ICD-10-CM

## 2023-07-22 ENCOUNTER — Encounter: Payer: Self-pay | Admitting: Oncology

## 2023-07-22 ENCOUNTER — Inpatient Hospital Stay: Payer: Medicare Other | Admitting: Oncology

## 2023-07-23 ENCOUNTER — Encounter: Payer: Self-pay | Admitting: Family Medicine

## 2023-07-23 ENCOUNTER — Other Ambulatory Visit: Payer: Self-pay | Admitting: Family Medicine

## 2023-07-23 DIAGNOSIS — I10 Essential (primary) hypertension: Secondary | ICD-10-CM

## 2023-07-23 MED ORDER — ATENOLOL 50 MG PO TABS
ORAL_TABLET | ORAL | 0 refills | Status: DC
Start: 2023-07-23 — End: 2023-08-16

## 2023-07-24 NOTE — Telephone Encounter (Signed)
Refilled 07/23/23 # 30. Requested Prescriptions  Refused Prescriptions Disp Refills   atenolol (TENORMIN) 50 MG tablet [Pharmacy Med Name: ATENOLOL 50MG  TABLETS] 90 tablet 1    Sig: TAKE 1 TABLET(50 MG) BY MOUTH DAILY     Cardiovascular: Beta Blockers 2 Failed - 07/23/2023  3:19 PM      Failed - Valid encounter within last 6 months    Recent Outpatient Visits           6 months ago Recurrent UTI   [place] [facility] [place], Netta Neat, DO   6 months ago Type 2 diabetes mellitus with other specified complication, without long-term current use of insulin [facility])   [place] [facility] [place], Netta Neat, DO   8 months ago Gross hematuria   [place] [facility] Smitty Cords, DO   9 months ago Type 2 diabetes mellitus with other specified complication, without long-term current use of insulin (HCC)   [place] [facility] Smitty Cords, DO   11 months ago Acute cystitis with hematuria   [place] [place], Netta Neat, [place]              Passed - Cr in normal range and within 360 days    Creat  Date Value Ref Range Status  11/08/2020 0.64 0.50 - 1.10 mg/dL Final   Creatinine, Ser  Date Value Ref Range Status  02/27/2023 0.62 0.57 - 1.00 mg/dL Final   Creatinine, Urine  Date Value Ref Range Status  10/19/2022 24 20 - 275 mg/dL Final         Passed - Last BP in normal range    BP Readings from Last 1 Encounters:  05/15/23 125/83         Passed - Last Heart Rate in normal range    Pulse Readings from Last 1 Encounters:  05/15/23 86          atenolol (TENORMIN) 50 MG tablet [Pharmacy Med Name: ATENOLOL 50MG  TABLETS] 90 tablet     Sig: TAKE 1 TABLET(50 MG) BY MOUTH DAILY     Cardiovascular: Beta Blockers 2 Failed - 07/23/2023  3:19 PM      Failed - Valid encounter within last 6 months    Recent Outpatient Visits           6 months  ago Recurrent UTI   [place] [facility] Smitty Cords, DO   6 months ago Type 2 diabetes mellitus with other specified complication, without long-term current use of insulin [facility])   [place] [facility] Smitty Cords, DO   8 months ago Gross hematuria   [place] [facility] Smitty Cords, DO   9 months ago Type 2 diabetes mellitus with other specified complication, without long-term current use of insulin [facility])   [place] [facility] Smitty Cords, DO   11 months ago Acute cystitis with hematuria   [place] [facility] [place], Netta Neat, DO              Passed - Cr in normal range and within 360 days    Creat  Date Value Ref Range Status  11/08/2020 0.64 0.50 - 1.10 mg/dL Final   Creatinine, Ser  Date Value Ref Range Status  02/27/2023 0.62 0.57 - 1.00 mg/dL Final   Creatinine, Urine  Date Value Ref  Range Status  10/19/2022 24 20 - 275 mg/dL Final         Passed - Last BP in normal range    BP Readings from Last 1 Encounters:  05/15/23 125/83         Passed - Last Heart Rate in normal range    Pulse Readings from Last 1 Encounters:  05/15/23 86

## 2023-07-31 ENCOUNTER — Inpatient Hospital Stay: Payer: Medicare Other

## 2023-07-31 ENCOUNTER — Ambulatory Visit: Payer: Medicare Other

## 2023-08-05 ENCOUNTER — Ambulatory Visit (INDEPENDENT_AMBULATORY_CARE_PROVIDER_SITE_OTHER): Payer: Medicare Other

## 2023-08-05 VITALS — BP 138/75 | HR 85 | Wt 246.8 lb

## 2023-08-05 DIAGNOSIS — Z3042 Encounter for surveillance of injectable contraceptive: Secondary | ICD-10-CM

## 2023-08-05 MED ORDER — MEDROXYPROGESTERONE ACETATE 150 MG/ML IM SUSP
150.0000 mg | Freq: Once | INTRAMUSCULAR | Status: AC
Start: 2023-08-05 — End: 2023-08-05
  Administered 2023-08-05: 150 mg via INTRAMUSCULAR

## 2023-08-05 NOTE — Progress Notes (Addendum)
    NURSE VISIT NOTE  Subjective:    Patient ID: Amanda Webb, female    DOB: [birth year], 51 y.o.   MRN: [id number]  HPI  Patient is a 52 y.o. [id number] female who presents for depo provera injection.   Objective:    BP 138/75   Pulse 85   Wt 246 lb 12.8 oz (111.9 kg)   BMI 38.65 kg/m   Last Annual: 02/27/23. Last pap: 02/27/23. Last Depo-Provera: 05/14/53. Side Effects if any: n/a. Serum HCG indicated? N/a. Depo-Provera 150 mg IM given by: Georgiana Shore, CMA. Site: Right Deltoid    Assessment:   1. Encounter for Depo-Provera contraception      Plan:   Next appointment due between 10/21/23 and 11/04/23.    Loman Chroman, CMA

## 2023-08-06 ENCOUNTER — Encounter: Payer: Self-pay | Admitting: Oncology

## 2023-08-06 ENCOUNTER — Inpatient Hospital Stay: Payer: Medicare Other | Admitting: Oncology

## 2023-08-15 ENCOUNTER — Other Ambulatory Visit: Payer: Self-pay | Admitting: Family Medicine

## 2023-08-15 DIAGNOSIS — I10 Essential (primary) hypertension: Secondary | ICD-10-CM

## 2023-08-16 ENCOUNTER — Other Ambulatory Visit: Payer: Self-pay | Admitting: Internal Medicine

## 2023-08-16 DIAGNOSIS — I10 Essential (primary) hypertension: Secondary | ICD-10-CM

## 2023-08-16 NOTE — Telephone Encounter (Signed)
Reordered today 08/16/23  Requested Prescriptions  Refused Prescriptions Disp Refills   atenolol (TENORMIN) 50 MG tablet [Pharmacy Med Name: ATENOLOL 50MG  TABLETS] 90 tablet     Sig: TAKE 1 TABLET(50 MG) BY MOUTH DAILY     Cardiovascular: Beta Blockers 2 Failed - 08/16/2023 11:02 AM      Failed - Valid encounter within last 6 months    Recent Outpatient Visits           6 months ago Recurrent UTI   [place] [facility] [place], Netta Neat, DO   7 months ago Type 2 diabetes mellitus with other specified complication, without long-term current use of insulin [facility])   [place] [facility] Smitty Cords, DO   9 months ago Gross hematuria   [place] [facility] Smitty Cords, DO   10 months ago Type 2 diabetes mellitus with other specified complication, without long-term current use of insulin [facility])   [place] [facility] Smitty Cords, DO   1 year ago Acute cystitis with hematuria   [place] [facility] [place], Netta Neat, DO       Future Appointments             In 2 weeks [place], Netta Neat, DO [place] [facility]            Passed - Cr in normal range and within 360 days    Creat  Date Value Ref Range Status  11/08/2020 0.64 0.50 - 1.10 mg/dL Final   Creatinine, Ser  Date Value Ref Range Status  02/27/2023 0.62 0.57 - 1.00 mg/dL Final   Creatinine, Urine  Date Value Ref Range Status  10/19/2022 24 20 - 275 mg/dL Final         Passed - Last BP in normal range    BP Readings from Last 1 Encounters:  08/05/23 138/75         Passed - Last Heart Rate in normal range    Pulse Readings from Last 1 Encounters:  08/05/23 85

## 2023-08-16 NOTE — Telephone Encounter (Signed)
Called pt - left message to return call and schedule appt. 

## 2023-08-16 NOTE — Telephone Encounter (Signed)
Requested medications are due for refill today.  yes  Requested medications are on the active medications list.  yes  Last refill. 07/22/2020 #30 0 rf  Future visit scheduled.   no  Notes to clinic.  Pt already given a courtesy refill. Pt called to schedule appt - left message on machine.    Requested Prescriptions  Pending Prescriptions Disp Refills   atenolol (TENORMIN) 50 MG tablet [Pharmacy Med Name: ATENOLOL 50MG  TABLETS] 30 tablet 0    Sig: TAKE 1 TABLET(50 MG) BY MOUTH DAILY     Cardiovascular: Beta Blockers 2 Failed - 08/15/2023  9:08 AM      Failed - Valid encounter within last 6 months    Recent Outpatient Visits           6 months ago Recurrent UTI   [place] [facility] [place], Netta Neat, DO   7 months ago Type 2 diabetes mellitus with other specified complication, without long-term current use of insulin [facility])   [place] [facility] Smitty Cords, DO   9 months ago Gross hematuria   [place] [facility] Smitty Cords, DO   10 months ago Type 2 diabetes mellitus with other specified complication, without long-term current use of insulin [facility])   [place] [facility] [place], Netta Neat, DO   1 year ago Acute cystitis with hematuria   [place] [facility] [place], Netta Neat, DO              Passed - Cr in normal range and within 360 days    Creat  Date Value Ref Range Status  11/08/2020 0.64 0.50 - 1.10 mg/dL Final   Creatinine, Ser  Date Value Ref Range Status  02/27/2023 0.62 0.57 - 1.00 mg/dL Final   Creatinine, Urine  Date Value Ref Range Status  10/19/2022 24 20 - 275 mg/dL Final         Passed - Last BP in normal range    BP Readings from Last 1 Encounters:  08/05/23 138/75         Passed - Last Heart Rate in normal range    Pulse Readings from Last 1 Encounters:  08/05/23 85

## 2023-08-20 ENCOUNTER — Telehealth: Payer: Medicare Other | Admitting: Physician Assistant

## 2023-08-20 DIAGNOSIS — R3989 Other symptoms and signs involving the genitourinary system: Secondary | ICD-10-CM | POA: Diagnosis not present

## 2023-08-20 DIAGNOSIS — B349 Viral infection, unspecified: Secondary | ICD-10-CM

## 2023-08-20 MED ORDER — SULFAMETHOXAZOLE-TRIMETHOPRIM 800-160 MG PO TABS
1.0000 | ORAL_TABLET | Freq: Two times a day (BID) | ORAL | 0 refills | Status: DC
Start: 2023-08-20 — End: 2023-10-11

## 2023-08-20 MED ORDER — BENZONATATE 100 MG PO CAPS
100.0000 mg | ORAL_CAPSULE | Freq: Three times a day (TID) | ORAL | 0 refills | Status: DC | PRN
Start: 2023-08-20 — End: 2023-09-03

## 2023-08-20 NOTE — Patient Instructions (Signed)
Amanda Webb, thank you for joining [name], PA-C for today's virtual visit.  While this provider is not your primary care provider (PCP), if your PCP is located in our provider database this encounter information will be shared with them immediately following your visit.   A [facility] MyChart account gives you access to today's visit and all your visits, tests, and labs performed at  [facility] " click here if you don't have a [facility] MyChart account or go to [email or web address]  Consent: (Patient) Amanda Webb provided verbal consent for this virtual visit at the beginning of the encounter.  Current Medications:  Current Outpatient Medications:    Accu-Chek FastClix Lancets MISC, USE TO CHECK BLOOD SUGAR UP TO TWICE DAILY AS DIRECTED, Disp: 102 each, Rfl: 5   albuterol (PROVENTIL) (2.5 MG/3ML) 0.083% nebulizer solution, Inhale 3ml (one vial) via nebulized route every four hours around the clock for 24 hours, followed by one vial every 6 hours for the next 24 hours. After 48 hours, take 3mL via neb every 6-8 hours PRN cough, wheeze, Disp: 150 mL, Rfl: 1   albuterol (VENTOLIN HFA) 108 (90 Base) MCG/ACT inhaler, INHALE 1 TO 2 PUFFS BY MOUTH EVERY 4 TO 6 HOURS AS NEEDED FOR SHORTNESS OF BREATH, Disp: 18 g, Rfl: 2   ARIPiprazole (ABILIFY) 2 MG tablet, Take 2 mg by mouth every morning. , Disp: , Rfl:    atenolol (TENORMIN) 50 MG tablet, TAKE 1 TABLET(50 MG) BY MOUTH DAILY, Disp: 30 tablet, Rfl: 0   azelastine (ASTELIN) 0.1 % nasal spray, USE 1 SPRAY IN EACH NOSTRIL TWICE DAILY AS DIRECTED, Disp: 30 mL, Rfl: 5   Cetirizine HCl 10 MG CAPS, Take 10 mg by mouth daily with lunch. , Disp: , Rfl:    Cyanocobalamin (B-12 COMPLIANCE INJECTION IJ), Inject 1 Dose as directed every 30 (thirty) days., Disp: , Rfl:    fluconazole (DIFLUCAN) 150 MG tablet, Take one tablet by mouth on Day 1. Repeat dose 2nd tablet on Day 3., Disp: 2 tablet, Rfl: 0   fluticasone (FLONASE) 50  MCG/ACT nasal spray, SHAKE LIQUID AND USE 2 SPRAYS IN EACH NOSTRIL EVERY DAY, Disp: 48 g, Rfl: 0   fluvoxaMINE (LUVOX) 100 MG tablet, Take 150 mg by mouth every morning. , Disp: , Rfl:    gabapentin (NEURONTIN) 100 MG capsule, Take by mouth., Disp: , Rfl:    glucose blood (ACCU-CHEK GUIDE) test strip, CHECK BLOOD SUGAR UP TO EVERY DAY AS DIRECTED, Disp: 200 strip, Rfl: 5   ketoconazole (NIZORAL) 2 % shampoo, APPLY EXTERNALLY 2 TIMES A WEEK, Disp: 120 mL, Rfl: 2   lamoTRIgine (LAMICTAL) 200 MG tablet, Take by mouth., Disp: , Rfl:    levothyroxine (SYNTHROID) 50 MCG tablet, TAKE 1 TABLET(50 MCG) BY MOUTH DAILY BEFORE BREAKFAST, Disp: 90 tablet, Rfl: 1   medroxyPROGESTERone (DEPO-PROVERA) 150 MG/ML injection, BRING DEPO INJECTION TO OFFICE EACH VISIT, Disp: 1 mL, Rfl: 2   MOUNJARO 5 MG/0.5ML Pen, Inject 5 mg into the skin once a week., Disp: 2 mL, Rfl: 2   Multiple Vitamin (MULTIVITAMIN WITH MINERALS) TABS tablet, Take 1 tablet by mouth daily., Disp: , Rfl:    nystatin cream (MYCOSTATIN), Apply 1 application. topically 2 (two) times daily as needed (rash)., Disp: 30 g, Rfl: 5   ondansetron (ZOFRAN-ODT) 4 MG disintegrating tablet, Take 1 tablet (4 mg total) by mouth every 8 (eight) hours as needed for nausea or vomiting., Disp: 20 tablet, Rfl: 0   pantoprazole (PROTONIX) 20  MG tablet, Take 1 tablet (20 mg) by mouth daily, Disp: 90 tablet, Rfl: 1   TRELEGY ELLIPTA 100-62.5-25 MCG/ACT AEPB, INHALE 1 PUFF INTO THE LUNGS DAILY, Disp: 180 each, Rfl: 1   tretinoin (RETIN-A) 0.025 % cream, APPLY EXTERNALLY TO THE AFFECTED AREA AT BEDTIME AS NEEDED, Disp: 45 g, Rfl: 2   Medications ordered in this encounter:  No orders of the defined types were placed in this encounter.    *If you need refills on other medications prior to your next appointment, please contact your pharmacy*  Follow-Up: Call back or seek an in-person evaluation if the symptoms worsen or if the condition fails to improve as  anticipated.  [facility] [phone]  Other Instructions Please increase fluids and rest.  Start a saline nasal rinse. You can use OTC Mucinex to thin congestion. Keep up with your usual pain relievers. I have sent in a prescription cough medicine to take as directed. As discussed, please retest for COVID in 24-36 hours. Let us know if positive. Stay home until we confirm the repeat test is negative and symptoms are improving.   Your symptoms are consistent with a bladder infection, also called acute cystitis. Please take your antibiotic (Bactrim) as directed until all pills are gone.  Stay very well hydrated.  Consider a daily probiotic (Align, Culturelle, or Activia) to help prevent stomach upset caused by the antibiotic.  Taking a probiotic daily may also help prevent recurrent UTIs.  Also consider taking AZO (Phenazopyridine) tablets to help decrease pain with urination.   Urinary Tract Infection A urinary tract infection (UTI) can occur any place along the urinary tract. The tract includes the kidneys, ureters, bladder, and urethra. A type of germ called bacteria often causes a UTI. UTIs are often helped with antibiotic medicine.  HOME CARE  If given, take antibiotics as told by your doctor. Finish them even if you start to feel better. Drink enough fluids to keep your pee (urine) clear or pale yellow. Avoid tea, drinks with caffeine, and bubbly (carbonated) drinks. Pee often. Avoid holding your pee in for a long time. Pee before and after having sex (intercourse). Wipe from front to back after you poop (bowel movement) if you are a woman. Use each tissue only once. GET HELP RIGHT AWAY IF:  You have back pain. You have lower belly (abdominal) pain. You have chills. You feel sick to your stomach (nauseous). You throw up (vomit). Your burning or discomfort with peeing does not go away. You have a fever. Your symptoms are not better in 3 days. MAKE SURE YOU:   Understand these instructions. Will watch your condition. Will get help right away if you are not doing well or get worse. Document Released: 05/21/2008 Document Revised: 08/27/2012 Document Reviewed: 07/03/2012 [facility] Patient Information 2015 [place], [place]. This information is not intended to replace advice given to you by your health care provider. Make sure you discuss any questions you have with your health care provider.    If you have been instructed to have an in-person evaluation today at a local Urgent Care facility, please use the link below. It will take you to a list of all of our available [facility], including address, phone number and hours of operation. Please do not delay care.  [facility]  If you or a family member do not have a primary care provider, use the link below to schedule a visit and establish care. When you choose  a [facility] primary care physician or advanced practice provider, you gain a long-term partner in health. Find a Primary Care Provider  Learn more about [facility] in-office and virtual care options: [facility] - Get Care Now

## 2023-08-20 NOTE — Progress Notes (Signed)
Virtual Visit Consent   Amanda Webb, you are scheduled for a virtual visit with a [facility] provider today. Just as with appointments in the office, your consent must be obtained to participate. Your consent will be active for this visit and any virtual visit you may have with one of our providers in the next 365 days. If you have a MyChart account, a copy of this consent can be sent to you electronically.  As this is a virtual visit, video technology does not allow for your provider to perform a traditional examination. This may limit your provider's ability to fully assess your condition. If your provider identifies any concerns that need to be evaluated in person or the need to arrange testing (such as labs, EKG, etc.), we will make arrangements to do so. Although advances in technology are sophisticated, we cannot ensure that it will always work on either your end or our end. If the connection with a video visit is poor, the visit may have to be switched to a telephone visit. With either a video or telephone visit, we are not always able to ensure that we have a secure connection.  By engaging in this virtual visit, you consent to the provision of healthcare and authorize for your insurance to be billed (if applicable) for the services provided during this visit. Depending on your insurance coverage, you may receive a charge related to this service.  I need to obtain your verbal consent now. Are you willing to proceed with your visit today? Amanda Webb has provided verbal consent on 08/20/2023 for a virtual visit (video or telephone). Amanda Webb, [place]  Date: 08/20/2023 5:08 PM  Virtual Visit via Video Note   I, Amanda Webb, connected with  Amanda Webb  ([id number], Mar 14, 1971) on 08/20/23 at  5:00 PM EDT by a video-enabled telemedicine application and verified that I am speaking with the correct person using two identifiers.  Location: Patient: Virtual Visit Location  Patient: Home Provider: Virtual Visit Location Provider: Home Office   I discussed the limitations of evaluation and management by telemedicine and the availability of in person appointments. The patient expressed understanding and agreed to proceed.    History of Present Illness: Amanda Webb is a 52 y.o. who identifies as a female who was assigned female at birth, and is being seen today for multiple complaints.  Endorses concern for UTI noting 3 days of urgency, frequency, dysuria. Denies hematuria. Denies vomiting but some nausea. Notes suprapubic pain.   Cough, nasal and head congestion starting in past 24 hours, today with lose stool. Notes feeling chills and sweaty. Unsure of true fever.  Notes cough is mainly dry but occasionally productive of scant phlegm. Denies melena, hematochezia or tenesmus. Denies recent travel. 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  Has taken a home COVID test that was negative.   HPI: HPI  Problems:  Patient Active Problem List   Diagnosis Date Noted   Major depressive disorder, recurrent, in partial remission (HCC) 11/08/2020   Bronchitis 10/21/2020   Cough 10/21/2020   Acute non-recurrent sinusitis 08/24/2020   Status post laparoscopic cholecystectomy 07/28/2020   Morbid obesity (HCC) 07/05/2020   Hepatic steatosis 06/23/2020   Lymphedema 03/09/2020   Chronic venous insufficiency 03/09/2020   Bilateral lower extremity edema 02/16/2020   Female cystocele 10/21/2018   Urinary incontinence, mixed 10/21/2018   B12 deficiency 04/08/2018   Iron deficiency anemia 03/14/2018  GAD (generalized anxiety disorder) 03/10/2018   Acute right-sided low back pain 10/28/2017   Tobacco abuse 05/09/2017   Multiple thyroid nodules 12/28/2016   Simple chronic bronchitis (HCC) 12/13/2016   BMI 40.0-44.9, adult (HCC) 09/26/2016   HLD (hyperlipidemia) 09/26/2016   Vitamin D deficiency 09/26/2016   Type 2 diabetes mellitus with  other specified complication (HCC) 06/17/2015   Candidiasis of skin and nail 02/21/2015   Candidal intertrigo 02/21/2015   Cellulitis of right lower extremity 02/18/2015   Benign cyst of right kidney 01/17/2015   Chronic sinusitis 11/15/2014   Pneumonia due to infectious organism 11/02/2014   Whiplash injury 09/03/2014   Heart palpitations 04/02/2014   Palpitations 04/02/2014   Allergic rhinitis 12/31/2013   Spondylosis of lumbar region without myelopathy or radiculopathy 12/28/2013   Chronic low back pain 09/28/2013   Other fatigue 09/28/2013   Hypothyroidism 09/28/2013   Alteration of body temperature 09/28/2013   Other fatigue 09/28/2013   Plantar fasciitis, bilateral 07/10/2013   Tarsal tunnel syndrome of right side 07/10/2013   Plantar fascial fibromatosis 07/10/2013   Encounter for surveillance of injectable contraceptive 05/13/2013   Diuresis excessive 03/24/2013   Polyuria 03/24/2013   Abnormal uterine and vaginal bleeding, unspecified 03/24/2013   Arthritis, multiple joint involvement 03/17/2013   Fibroblastic disorder 07/28/2012   Borderline personality disorder (HCC) 12/17/2010   Borderline personality disorder in adult [facility]) 12/17/2010   Essential hypertension 02/18/2010   Carpal tunnel syndrome on both sides 12/17/2006   Major depressive disorder, single episode 02/21/1998   Major depressive disorder, single episode 02/21/1998   Disk prolapse 04/17/1995   Depression with anxiety 03/18/1995   OCD (obsessive compulsive disorder) 03/18/1995   Severe anxiety with panic 03/18/1995    Allergies:  Allergies  Allergen Reactions   Bee Venom Anaphylaxis, Hives and Swelling    Carries Epi pen.    Cat Hair Extract Itching, Other (See Comments) and Swelling    Allergic to trees, nuts, wheat, grass, cats & dogs - itchy watery eyes, swelling. Uses Zyrtec & Flonase & Benadryl if really bad. Used to get allergy shots. Allergic to trees, nuts, wheat, grass, cats & dogs - itchy  watery eyes, swelling. Uses Zyrtec & Flonase & Benadryl if really bad. Used to get allergy shots.   Dog Epithelium Cough and Shortness Of Breath   Dog Epithelium (Canis Lupus Familiaris) Shortness Of Breath   Dog Fennel Cough and Shortness Of Breath   Dog Fennel Allergy Skin Test Shortness Of Breath   Dust Mite Extract Cough and Shortness Of Breath   Tetracyclines & Related Nausea And Vomiting   Lactose Diarrhea and Nausea And Vomiting   Milk (Cow) Diarrhea and Nausea And Vomiting   Milk Protein Diarrhea and Nausea And Vomiting   Milk-Related Compounds Diarrhea and Nausea And Vomiting   Tape Other (See Comments) and Rash    Needs to use paper tape. Breaks out with severe rash, pulls skin off when using adhesive.   Wound Dressing Adhesive Rash    Other reaction(s): Other Needs to use paper tape. Breaks out with severe rash, pulls skin off when using adhesive.  Needs to use paper tape. Breaks out with severe rash, pulls skin off when using adhesive. Needs to use paper tape. Breaks out with severe rash, pulls skin off when using adhesive.   Medications:  Current Outpatient Medications:    benzonatate (TESSALON) 100 MG capsule, Take 1 capsule (100 mg total) by mouth 3 (three) times daily as needed for cough., Disp: 30  capsule, Rfl: 0   sulfamethoxazole-trimethoprim (BACTRIM DS) 800-160 MG tablet, Take 1 tablet by mouth 2 (two) times daily., Disp: 14 tablet, Rfl: 0   Accu-Chek FastClix Lancets MISC, USE TO CHECK BLOOD SUGAR UP TO TWICE DAILY AS DIRECTED, Disp: 102 each, Rfl: 5   albuterol (PROVENTIL) (2.5 MG/3ML) 0.083% nebulizer solution, Inhale 3ml (one vial) via nebulized route every four hours around the clock for 24 hours, followed by one vial every 6 hours for the next 24 hours. After 48 hours, take 3mL via neb every 6-8 hours PRN cough, wheeze, Disp: 150 mL, Rfl: 1   albuterol (VENTOLIN HFA) 108 (90 Base) MCG/ACT inhaler, INHALE 1 TO 2 PUFFS BY MOUTH EVERY 4 TO 6 HOURS AS NEEDED FOR  SHORTNESS OF BREATH, Disp: 18 g, Rfl: 2   ARIPiprazole (ABILIFY) 2 MG tablet, Take 2 mg by mouth every morning. , Disp: , Rfl:    atenolol (TENORMIN) 50 MG tablet, TAKE 1 TABLET(50 MG) BY MOUTH DAILY, Disp: 30 tablet, Rfl: 0   azelastine (ASTELIN) 0.1 % nasal spray, USE 1 SPRAY IN EACH NOSTRIL TWICE DAILY AS DIRECTED, Disp: 30 mL, Rfl: 5   Cetirizine HCl 10 MG CAPS, Take 10 mg by mouth daily with lunch. , Disp: , Rfl:    Cyanocobalamin (B-12 COMPLIANCE INJECTION IJ), Inject 1 Dose as directed every 30 (thirty) days., Disp: , Rfl:    fluconazole (DIFLUCAN) 150 MG tablet, Take one tablet by mouth on Day 1. Repeat dose 2nd tablet on Day 3., Disp: 2 tablet, Rfl: 0   fluticasone (FLONASE) 50 MCG/ACT nasal spray, SHAKE LIQUID AND USE 2 SPRAYS IN EACH NOSTRIL EVERY DAY, Disp: 48 g, Rfl: 0   fluvoxaMINE (LUVOX) 100 MG tablet, Take 150 mg by mouth every morning. , Disp: , Rfl:    gabapentin (NEURONTIN) 100 MG capsule, Take by mouth., Disp: , Rfl:    glucose blood (ACCU-CHEK GUIDE) test strip, CHECK BLOOD SUGAR UP TO EVERY DAY AS DIRECTED, Disp: 200 strip, Rfl: 5   ketoconazole (NIZORAL) 2 % shampoo, APPLY EXTERNALLY 2 TIMES A WEEK, Disp: 120 mL, Rfl: 2   lamoTRIgine (LAMICTAL) 200 MG tablet, Take by mouth., Disp: , Rfl:    levothyroxine (SYNTHROID) 50 MCG tablet, TAKE 1 TABLET(50 MCG) BY MOUTH DAILY BEFORE BREAKFAST, Disp: 90 tablet, Rfl: 1   medroxyPROGESTERone (DEPO-PROVERA) 150 MG/ML injection, BRING DEPO INJECTION TO OFFICE EACH VISIT, Disp: 1 mL, Rfl: 2   MOUNJARO 5 MG/0.5ML Pen, Inject 5 mg into the skin once a week., Disp: 2 mL, Rfl: 2   Multiple Vitamin (MULTIVITAMIN WITH MINERALS) TABS tablet, Take 1 tablet by mouth daily., Disp: , Rfl:    nystatin cream (MYCOSTATIN), Apply 1 application. topically 2 (two) times daily as needed (rash)., Disp: 30 g, Rfl: 5   ondansetron (ZOFRAN-ODT) 4 MG disintegrating tablet, Take 1 tablet (4 mg total) by mouth every 8 (eight) hours as needed for nausea or  vomiting., Disp: 20 tablet, Rfl: 0   pantoprazole (PROTONIX) 20 MG tablet, Take 1 tablet (20 mg) by mouth daily, Disp: 90 tablet, Rfl: 1   TRELEGY ELLIPTA 100-62.5-25 MCG/ACT AEPB, INHALE 1 PUFF INTO THE LUNGS DAILY, Disp: 180 each, Rfl: 1   tretinoin (RETIN-A) 0.025 % cream, APPLY EXTERNALLY TO THE AFFECTED AREA AT BEDTIME AS NEEDED, Disp: 45 g, Rfl: 2  Observations/Objective: Patient is well-developed, well-nourished in no acute distress.  Resting comfortably at home.  Head is normocephalic, atraumatic.  No labored breathing.  Speech is clear and coherent with logical  content.  Patient is alert and oriented at baseline.   Assessment and Plan: 1. Suspected UTI  - sulfamethoxazole-trimethoprim (BACTRIM DS) 800-160 MG tablet; Take 1 tablet by mouth 2 (two) times daily.  Dispense: 14 tablet; Refill: 0  Classic UTI symptoms with absence of alarm signs or symptoms. Prior history of UTI. Will treat empirically with Bactrim for suspected uncomplicated cystitis. Supportive measures and OTC medications reviewed. Strict in-person evaluation precautions discussed.    2. Viral illness  Will have her tested for COVID in 24-36 hours giving high suspicion. Supportive measures, OTC medications reviewed. Tessalon per orders. Work note provided.   Follow Up Instructions: I discussed the assessment and treatment plan with the patient. The patient was provided an opportunity to ask questions and all were answered. The patient agreed with the plan and demonstrated an understanding of the instructions.  A copy of instructions were sent to the patient via MyChart unless otherwise noted below.   The patient was advised to call back or seek an in-person evaluation if the symptoms worsen or if the condition fails to improve as anticipated.  Time:  I spent 10 minutes with the patient via telehealth technology discussing the above problems/concerns.    Amanda Climes, PA-C

## 2023-08-22 DIAGNOSIS — F4310 Post-traumatic stress disorder, unspecified: Secondary | ICD-10-CM | POA: Diagnosis not present

## 2023-08-25 ENCOUNTER — Other Ambulatory Visit: Payer: Self-pay | Admitting: Family Medicine

## 2023-08-25 DIAGNOSIS — E039 Hypothyroidism, unspecified: Secondary | ICD-10-CM

## 2023-08-27 ENCOUNTER — Other Ambulatory Visit: Payer: Self-pay | Admitting: *Deleted

## 2023-08-27 ENCOUNTER — Inpatient Hospital Stay (HOSPITAL_BASED_OUTPATIENT_CLINIC_OR_DEPARTMENT_OTHER): Payer: Medicare Other | Admitting: Oncology

## 2023-08-27 ENCOUNTER — Inpatient Hospital Stay: Payer: Medicare Other | Attending: Oncology

## 2023-08-27 ENCOUNTER — Inpatient Hospital Stay: Payer: Medicare Other

## 2023-08-27 ENCOUNTER — Encounter: Payer: Self-pay | Admitting: Oncology

## 2023-08-27 VITALS — BP 131/67 | HR 81 | Temp 97.0°F | Resp 18 | Wt 239.4 lb

## 2023-08-27 DIAGNOSIS — D509 Iron deficiency anemia, unspecified: Secondary | ICD-10-CM

## 2023-08-27 DIAGNOSIS — D519 Vitamin B12 deficiency anemia, unspecified: Secondary | ICD-10-CM | POA: Diagnosis not present

## 2023-08-27 DIAGNOSIS — D500 Iron deficiency anemia secondary to blood loss (chronic): Secondary | ICD-10-CM | POA: Diagnosis not present

## 2023-08-27 DIAGNOSIS — E538 Deficiency of other specified B group vitamins: Secondary | ICD-10-CM

## 2023-08-27 DIAGNOSIS — N920 Excessive and frequent menstruation with regular cycle: Secondary | ICD-10-CM | POA: Diagnosis not present

## 2023-08-27 LAB — CBC
HCT: 41.3 % (ref 36.0–46.0)
Hemoglobin: 14.1 g/dL (ref 12.0–15.0)
MCH: 32 pg (ref 26.0–34.0)
MCHC: 34.1 g/dL (ref 30.0–36.0)
MCV: 93.9 fL (ref 80.0–100.0)
Platelets: 244 10*3/uL (ref 150–400)
RBC: 4.4 MIL/uL (ref 3.87–5.11)
RDW: 12.3 % (ref 11.5–15.5)
WBC: 12.8 10*3/uL — ABNORMAL HIGH (ref 4.0–10.5)
nRBC: 0 % (ref 0.0–0.2)

## 2023-08-27 LAB — FERRITIN: Ferritin: 59 ng/mL (ref 11–307)

## 2023-08-27 LAB — IRON AND TIBC
Iron: 73 ug/dL (ref 28–170)
Saturation Ratios: 20 % (ref 10.4–31.8)
TIBC: 375 ug/dL (ref 250–450)
UIBC: 302 ug/dL

## 2023-08-27 LAB — VITAMIN B12: Vitamin B-12: 518 pg/mL (ref 180–914)

## 2023-08-27 MED ORDER — CYANOCOBALAMIN 1000 MCG/ML IJ SOLN
1000.0000 ug | INTRAMUSCULAR | Status: DC
  Administered 2023-08-27: 1000 ug via INTRAMUSCULAR
  Filled 2023-08-27: qty 1

## 2023-08-27 MED ORDER — CYANOCOBALAMIN 1000 MCG/ML IJ SOLN: 1000.0000 ug | Freq: Once | INTRAMUSCULAR | Status: AC

## 2023-08-27 NOTE — Telephone Encounter (Signed)
Requested medication (s) are due for refill today: yes  Requested medication (s) are on the active medication list: yes  Last refill:  02/23/23  Future visit scheduled: yes  Notes to clinic:  Unable to refill per protocol due to failed labs, no updated TSH results.      Requested Prescriptions  Pending Prescriptions Disp Refills   levothyroxine (SYNTHROID) 50 MCG tablet [Pharmacy Med Name: LEVOTHYROXINE 0.05MG  ( ) TAB] 90 tablet 1    Sig: TAKE 1 TABLET(50 MCG) BY MOUTH DAILY BEFORE BREAKFAST     Endocrinology:  Hypothyroid Agents Failed - 08/25/2023  8:09 AM      Failed - TSH in normal range and within 360 days    TSH  Date Value Ref Range Status  11/08/2020 2.28 mIU/L Final    Comment:              Reference Range .           > or = 20 Years  0.40-4.50 .                Pregnancy Ranges           First trimester    0.26-2.66           Second trimester   0.55-2.73           Third trimester    0.43-2.91          Passed - Valid encounter within last 12 months    Recent Outpatient Visits           7 months ago Recurrent UTI   [facility] Taleisha Jane, Netta Neat, DO   7 months ago Type 2 diabetes mellitus with other specified complication, without long-term current use of insulin [facility])   [facility] Smitty Cords, DO   9 months ago Gross hematuria   [facility] Smitty Cords, DO   10 months ago Type 2 diabetes mellitus with other specified complication, without long-term current use of insulin [facility])   [facility] Smitty Cords, DO   1 year ago Acute cystitis with hematuria   [facility] Smitty Cords, DO       Future Appointments             In 1 week Althea Charon, Netta Neat, DO [facility] [place], [facility]

## 2023-08-27 NOTE — Progress Notes (Signed)
Hematology/Oncology Consult note [facility]  [phone] [phone]  Patient Care Team: Smitty Cords, DO as PCP - General (Family Medicine) Antonieta Iba, MD as Consulting Physician (Cardiology) Creig Hines, MD as Consulting Physician (Hematology and Oncology)   Name of the patient: Amanda Webb  [id number]  September 24, 1971   Date of visit: 08/27/23  Diagnosis- history of iron and B12 deficiency anemia   Chief complaint/ Reason for visit-routine follow-up of anemia  Heme/Onc history:  Patient is a 52 year old female with history of iron and B12 deficiency anemia which has been attribute it to menorrhagia.  She has positive intrinsic factor antibody and she is on B12 injections which she gets at the cancer center  Interval history-reports ongoing fatigue.  Has chronic symptoms of mixed incontinence and has seen GYN as well as urology in the past.  ECOG PS- 1 Pain scale- 0   Review of systems- Review of Systems  Constitutional:  Positive for malaise/fatigue. Negative for chills, fever and weight loss.  HENT:  Negative for congestion, ear discharge and nosebleeds.   Eyes:  Negative for blurred vision.  Respiratory:  Negative for cough, hemoptysis, sputum production, shortness of breath and wheezing.   Cardiovascular:  Negative for chest pain, palpitations, orthopnea and claudication.  Gastrointestinal:  Negative for abdominal pain, blood in stool, constipation, diarrhea, heartburn, melena, nausea and vomiting.  Genitourinary:  Negative for dysuria, flank pain, frequency, hematuria and urgency.  Musculoskeletal:  Negative for back pain, joint pain and myalgias.  Skin:  Negative for rash.  Neurological:  Negative for dizziness, tingling, focal weakness, seizures, weakness and headaches.  Endo/Heme/Allergies:  Does not bruise/bleed easily.  Psychiatric/Behavioral:  Negative for depression and suicidal ideas. The patient  does not have insomnia.       Allergies  Allergen Reactions   Bee Venom Anaphylaxis, Hives and Swelling    Carries Epi pen.    Cat Hair Extract Itching, Other (See Comments) and Swelling    Allergic to trees, nuts, wheat, grass, cats & dogs - itchy watery eyes, swelling. Uses Zyrtec & Flonase & Benadryl if really bad. Used to get allergy shots. Allergic to trees, nuts, wheat, grass, cats & dogs - itchy watery eyes, swelling. Uses Zyrtec & Flonase & Benadryl if really bad. Used to get allergy shots.   Dog Epithelium Cough and Shortness Of Breath   Dog Epithelium (Canis Lupus Familiaris) Shortness Of Breath   Dog Fennel Cough and Shortness Of Breath   Dog Fennel Allergy Skin Test Shortness Of Breath   Dust Mite Extract Cough and Shortness Of Breath   Tetracyclines & Related Nausea And Vomiting   Lactose Diarrhea and Nausea And Vomiting   Milk (Cow) Diarrhea and Nausea And Vomiting   Milk Protein Diarrhea and Nausea And Vomiting   Milk-Related Compounds Diarrhea and Nausea And Vomiting   Tape Other (See Comments) and Rash    Needs to use paper tape. Breaks out with severe rash, pulls skin off when using adhesive.   Wound Dressing Adhesive Rash    Other reaction(s): Other Needs to use paper tape. Breaks out with severe rash, pulls skin off when using adhesive.  Needs to use paper tape. Breaks out with severe rash, pulls skin off when using adhesive. Needs to use paper tape. Breaks out with severe rash, pulls skin off when using adhesive.     Past Medical History:  Diagnosis Date   Allergy    Anemia    Anxiety  Arrhythmia    Arthritis    Cervical dysplasia    Cystitis    Depression    Dyspnea    GERD (gastroesophageal reflux disease)    Gross hematuria    Headache    Heart murmur    asymptomatic   History of methicillin resistant staphylococcus aureus (MRSA) 2013   HLD (hyperlipidemia)    Hypertension    Hypothyroid    Lymphedema    Palpitations    Pernicious anemia     Tobacco abuse    Type 2 diabetes mellitus (HCC)      Past Surgical History:  Procedure Laterality Date   CARPAL TUNNEL RELEASE Bilateral 2011   FOOT SURGERY Right 2013   Plantar fascia   KNEE ARTHROSCOPY WITH MEDIAL MENISECTOMY Right 07/11/2021   Procedure: Right knee arthroscopic partial medial meniscectomy;  Surgeon: Signa Kell, MD;  [facility];  Service: Orthopedics;  Laterality: Right;   ROBOTIC ASSISTED LAPAROSCOPIC CHOLECYSTECTOMY  07/15/2020   Dr Claudine Mouton   TONSILLECTOMY  1979   with ear tubes    Social History   Socioeconomic History   Marital status: Divorced    Spouse name: Not on file   Number of children: Not on file   Years of education: Some college   Highest education level: Some college, no degree  Occupational History   Occupation: disability   Tobacco Use   Smoking status: Every Day    Current packs/day: 1.00    Average packs/day: 1 pack/day for 32.0 years (32.0 ttl pk-yrs)    Types: Cigarettes    Passive exposure: Current   Smokeless tobacco: Never   Tobacco comments:    1 ppd/ 11/22/2020  Vaping Use   Vaping status: Never Used  Substance and Sexual Activity   Alcohol use: Yes    Alcohol/week: 3.0 standard drinks of alcohol    Types: 3 Glasses of wine per week   Drug use: No   Sexual activity: Yes    Birth control/protection: Condom, Injection  Other Topics Concern   Not on file  Social History Narrative   Lives with mom   Social Determinants of Health   Financial Resource Strain: Low Risk  (03/14/2021)   Overall Financial Resource Strain (CARDIA)    Difficulty of Paying Living Expenses: Not hard at all  Food Insecurity: No Food Insecurity (03/14/2021)   Hunger Vital Sign    Worried About Running Out of Food in the Last Year: Never true    Ran Out of Food in the Last Year: Never true  Transportation Needs: No Transportation Needs (03/14/2021)   PRAPARE - Administrator, Civil Service (Medical): No     Lack of Transportation (Non-Medical): No  Physical Activity: Inactive (03/14/2021)   Exercise Vital Sign    Days of Exercise per Week: 0 days    Minutes of Exercise per Session: 0 min  Stress: Stress Concern Present (03/14/2021)   [employer] of Occupational Health - Occupational Stress Questionnaire    Feeling of Stress : Very much  Social Connections: Socially Isolated (07/07/2019)   Social Connection and Isolation Panel [NHANES]    Frequency of Communication with Friends and Family: Three times a week    Frequency of Social Gatherings with Friends and Family: Three times a week    Attends Religious Services: Never    Active Member of Clubs or Organizations: No    Attends Banker Meetings: Never    Marital Status: Divorced  Catering manager Violence:  Not At Risk (07/07/2019)   Humiliation, Afraid, Rape, and Kick questionnaire    Fear of Current or Ex-Partner: No    Emotionally Abused: No    Physically Abused: No    Sexually Abused: No    Family History  Problem Relation Age of Onset   Depression Mother    Mental illness Mother    Alcohol abuse Mother    Lung cancer Mother    Cancer Maternal Grandmother    Diabetes Maternal Grandmother    Ovarian cancer Maternal Grandmother    Kidney disease Maternal Grandfather    Diabetes Father    Mental illness Father    Depression Father    Drug abuse Father    Depression Paternal Grandfather    Drug abuse Paternal Grandfather    Bladder Cancer Neg Hx    Breast cancer Neg Hx      Current Outpatient Medications:    Accu-Chek FastClix Lancets MISC, USE TO CHECK BLOOD SUGAR UP TO TWICE DAILY AS DIRECTED, Disp: 102 each, Rfl: 5   albuterol (PROVENTIL) (2.5 MG/3ML) 0.083% nebulizer solution, Inhale 3ml (one vial) via nebulized route every four hours around the clock for 24 hours, followed by one vial every 6 hours for the next 24 hours. After 48 hours, take 3mL via neb every 6-8 hours PRN cough, wheeze, Disp: 150 mL,  Rfl: 1   albuterol (VENTOLIN HFA) 108 (90 Base) MCG/ACT inhaler, INHALE 1 TO 2 PUFFS BY MOUTH EVERY 4 TO 6 HOURS AS NEEDED FOR SHORTNESS OF BREATH, Disp: 18 g, Rfl: 2   ARIPiprazole (ABILIFY) 2 MG tablet, Take 2 mg by mouth every morning. , Disp: , Rfl:    atenolol (TENORMIN) 50 MG tablet, TAKE 1 TABLET(50 MG) BY MOUTH DAILY, Disp: 30 tablet, Rfl: 0   azelastine (ASTELIN) 0.1 % nasal spray, USE 1 SPRAY IN EACH NOSTRIL TWICE DAILY AS DIRECTED, Disp: 30 mL, Rfl: 5   benzonatate (TESSALON) 100 MG capsule, Take 1 capsule (100 mg total) by mouth 3 (three) times daily as needed for cough., Disp: 30 capsule, Rfl: 0   Cetirizine HCl 10 MG CAPS, Take 10 mg by mouth daily with lunch. , Disp: , Rfl:    Cyanocobalamin (B-12 COMPLIANCE INJECTION IJ), Inject 1 Dose as directed every 30 (thirty) days., Disp: , Rfl:    fluconazole (DIFLUCAN) 150 MG tablet, Take one tablet by mouth on Day 1. Repeat dose 2nd tablet on Day 3., Disp: 2 tablet, Rfl: 0   fluticasone (FLONASE) 50 MCG/ACT nasal spray, SHAKE LIQUID AND USE 2 SPRAYS IN EACH NOSTRIL EVERY DAY, Disp: 48 g, Rfl: 0   fluvoxaMINE (LUVOX) 100 MG tablet, Take 150 mg by mouth every morning. , Disp: , Rfl:    gabapentin (NEURONTIN) 100 MG capsule, Take by mouth., Disp: , Rfl:    glucose blood (ACCU-CHEK GUIDE) test strip, CHECK BLOOD SUGAR UP TO EVERY DAY AS DIRECTED, Disp: 200 strip, Rfl: 5   ketoconazole (NIZORAL) 2 % shampoo, APPLY EXTERNALLY 2 TIMES A WEEK, Disp: 120 mL, Rfl: 2   lamoTRIgine (LAMICTAL) 200 MG tablet, Take by mouth., Disp: , Rfl:    levothyroxine (SYNTHROID) 50 MCG tablet, TAKE 1 TABLET(50 MCG) BY MOUTH DAILY BEFORE BREAKFAST, Disp: 90 tablet, Rfl: 1   medroxyPROGESTERone (DEPO-PROVERA) 150 MG/ML injection, BRING DEPO INJECTION TO OFFICE EACH VISIT, Disp: 1 mL, Rfl: 2   MOUNJARO 5 MG/0.5ML Pen, Inject 5 mg into the skin once a week., Disp: 2 mL, Rfl: 2   Multiple Vitamin (MULTIVITAMIN WITH  MINERALS) TABS tablet, Take 1 tablet by mouth daily.,  Disp: , Rfl:    nystatin cream (MYCOSTATIN), Apply 1 application. topically 2 (two) times daily as needed (rash)., Disp: 30 g, Rfl: 5   ondansetron (ZOFRAN-ODT) 4 MG disintegrating tablet, Take 1 tablet (4 mg total) by mouth every 8 (eight) hours as needed for nausea or vomiting., Disp: 20 tablet, Rfl: 0   pantoprazole (PROTONIX) 20 MG tablet, Take 1 tablet (20 mg) by mouth daily, Disp: 90 tablet, Rfl: 1   sulfamethoxazole-trimethoprim (BACTRIM DS) 800-160 MG tablet, Take 1 tablet by mouth 2 (two) times daily., Disp: 14 tablet, Rfl: 0   TRELEGY ELLIPTA 100-62.5-25 MCG/ACT AEPB, INHALE 1 PUFF INTO THE LUNGS DAILY, Disp: 180 each, Rfl: 1   tretinoin (RETIN-A) 0.025 % cream, APPLY EXTERNALLY TO THE AFFECTED AREA AT BEDTIME AS NEEDED, Disp: 45 g, Rfl: 2 No current facility-administered medications for this visit.  Facility-Administered Medications Ordered in Other Visits:    cyanocobalamin (VITAMIN B12) injection 1,000 mcg, 1,000 mcg, Intramuscular, Q30 days, Creig Hines, MD, 1,000 mcg at 08/27/23 1153   cyanocobalamin (VITAMIN B12) injection 1,000 mcg, 1,000 mcg, Intramuscular, Once, Creig Hines, MD  Physical exam:  Vitals:   08/27/23 1047  BP: 131/67  Pulse: 81  Resp: 18  Temp: (!) 97 F (36.1 C)  TempSrc: Tympanic  SpO2: 98%  Weight: 239 lb 6.4 oz (108.6 kg)   Physical Exam Cardiovascular:     Rate and Rhythm: Normal rate and regular rhythm.     Heart sounds: Normal heart sounds.  Pulmonary:     Effort: Pulmonary effort is normal.     Breath sounds: Normal breath sounds.  Skin:    General: Skin is warm and dry.  Neurological:     Mental Status: She is alert and oriented to person, place, and time.         Latest Ref Rng & Units 02/27/2023    9:15 AM  CMP  Glucose 70 - 99 mg/dL 540   BUN 6 - 24 mg/dL 4   Creatinine 9.81 - 1.91 mg/dL 4.78   Sodium 295 - 621 mmol/L 148   Potassium 3.5 - 5.2 mmol/L 3.8   Chloride 96 - 106 mmol/L 107   CO2 20 - 29 mmol/L 22   Calcium  8.7 - 10.2 mg/dL 9.0       Latest Ref Rng & Units 08/27/2023   10:28 AM  CBC  WBC 4.0 - 10.5 K/uL 12.8   Hemoglobin 12.0 - 15.0 g/dL 30.8   Hematocrit 65.7 - 46.0 % 41.3   Platelets 150 - 400 K/uL 244     No images are attached to the encounter.  No results found.   Assessment and plan- Patient is a 52 y.o. female with history of B12 deficiency anemia here for routine follow-up  Patient is not presently anemic with a hemoglobin of 14.1.  White cell count is mildly elevated at 12.8 likely reactive.  Ferritin levels are normal at 59 with an iron saturation of 20%.  She does not require any IV iron at this time.  B12 levels are pending.  She will get B12 injections today and then continue monthly B12 injections at the cancer center.  I will repeat labs in 1 year and see her thereafter   Visit Diagnosis 1. Iron deficiency anemia, unspecified iron deficiency anemia type   2. B12 deficiency      Dr. Owens Shark, MD, MPH [facility]  [phone] 08/27/2023 2:57 PM

## 2023-09-02 ENCOUNTER — Other Ambulatory Visit: Payer: Self-pay | Admitting: Family Medicine

## 2023-09-02 DIAGNOSIS — J309 Allergic rhinitis, unspecified: Secondary | ICD-10-CM

## 2023-09-03 ENCOUNTER — Encounter: Payer: Self-pay | Admitting: Family Medicine

## 2023-09-03 ENCOUNTER — Ambulatory Visit (INDEPENDENT_AMBULATORY_CARE_PROVIDER_SITE_OTHER): Payer: Medicare Other | Admitting: Family Medicine

## 2023-09-03 VITALS — BP 114/74 | HR 81 | Ht 67.0 in | Wt 236.8 lb

## 2023-09-03 DIAGNOSIS — Z23 Encounter for immunization: Secondary | ICD-10-CM

## 2023-09-03 DIAGNOSIS — N390 Urinary tract infection, site not specified: Secondary | ICD-10-CM | POA: Diagnosis not present

## 2023-09-03 DIAGNOSIS — E1169 Type 2 diabetes mellitus with other specified complication: Secondary | ICD-10-CM

## 2023-09-03 LAB — POCT URINALYSIS DIPSTICK
Bilirubin, UA: NEGATIVE
Glucose, UA: NEGATIVE
Ketones, UA: NEGATIVE
Nitrite, UA: NEGATIVE
Protein, UA: NEGATIVE
Spec Grav, UA: <=1.005 — AB (ref 1.010–1.025)
Urobilinogen, UA: 0.2 U/dL
pH, UA: 6 (ref 5.0–8.0)

## 2023-09-03 LAB — POCT GLYCOSYLATED HEMOGLOBIN (HGB A1C): Hemoglobin A1C: 6.8 % — AB (ref 4.0–5.6)

## 2023-09-03 NOTE — Progress Notes (Signed)
Subjective:    Patient ID: Amanda Webb, female    DOB: 23-Jun-1971, 52 y.o.   MRN: [id number]  Amanda Webb is a 52 y.o. female presenting on 09/03/2023 for Diabetes   HPI  Discussed the use of AI scribe software for clinical note transcription with the patient, who gave verbal consent to proceed.  History of Present Illness    Elevated WBC Last checked 08/27/23, 1 week ago with result 12.8, prior normal range was 8.3 to 10.7 Labs were done by Dr Smith Robert Hematology. Followed for B12 deficiency anemia Today she expresses concern about her elevated white blood cell count of 12, which was noted during recent blood work. The patient wonders if this could be due to a potential urinary tract infection (UTI) as she has been experiencing symptoms of urgency and frequency, although less severe than previous UTI episodes. - Chart review shows Dr Smith Robert believes the mild elevation was "reactive"  Left Lateral Thigh Nerve Impingement Suspected Meralgia Paresthetica In addition, the patient reports a persistent burning sensation in her thigh, which started when she began a physically demanding job in July. She describes the sensation as feeling like her thigh has been burning in pain., it seems to be positional and relieved with position change.  Trauma to R Gluteal Region Fall injury 04/2023 causing a noticeable indentation in her right buttock, which occurred after a fall down metal steps in May. The fall resulted in a large bruise from her hip to the middle of her back. The bruise has since healed, but the indentation remains.  Morbid Obesity BMI >37 Type 2 Diabetes On Ozempic previously, has Mounjaro, has not started yet Improved lifestyle diet and activity level Significant weight loss now reports over the past few months due to increased activity from her recent job. She has lost 20-25 pounds over the past 4-6 months and expresses excitement about this progress.  Previously working 3rd shift. No  longer on this shift. A1c today 6.6, prior 6.8 Denies hypoglycemia, polyuria, visual changes, numbness or tingling.  History of Recurrent UTI        09/03/2023   10:54 AM 11/19/2022   10:50 AM 10/19/2022    3:27 PM  Depression screen PHQ 2/9  Decreased Interest 2 3 2   Down, Depressed, Hopeless 1 3 3   PHQ - 2 Score 3 6 5   Altered sleeping 0 3 3  Tired, decreased energy 2 3 2   Change in appetite 0 3 2  Feeling bad or failure about yourself  1 3 2   Trouble concentrating 0 2 1  Moving slowly or fidgety/restless 0 0 0  Suicidal thoughts 0 0 0  PHQ-9 Score 6 20 15   Difficult doing work/chores Very difficult Not difficult at all Not difficult at all    Social History   Tobacco Use   Smoking status: Every Day    Current packs/day: 1.00    Average packs/day: 1 pack/day for 32.0 years (32.0 ttl pk-yrs)    Types: Cigarettes    Passive exposure: Current   Smokeless tobacco: Never   Tobacco comments:    1 ppd/ 11/22/2020  Vaping Use   Vaping status: Never Used  Substance Use Topics   Alcohol use: Yes    Alcohol/week: 3.0 standard drinks of alcohol    Types: 3 Glasses of wine per week   Drug use: No    Review of Systems Per HPI unless specifically indicated above     Objective:    BP 114/74  Pulse 81   Ht 5\' 7"  (1.702 m)   Wt 236 lb 12.8 oz (107.4 kg)   SpO2 91%   BMI 37.09 kg/m   Wt Readings from Last 3 Encounters:  09/03/23 236 lb 12.8 oz (107.4 kg)  08/27/23 239 lb 6.4 oz (108.6 kg)  08/05/23 246 lb 12.8 oz (111.9 kg)    Physical Exam Vitals and nursing note reviewed.  Constitutional:      General: She is not in acute distress.    Appearance: Normal appearance. She is well-developed. She is not diaphoretic.     Comments: Well-appearing, comfortable, cooperative  HENT:     Head: Normocephalic and atraumatic.  Eyes:     General:        Right eye: No discharge.        Left eye: No discharge.     Conjunctiva/sclera: Conjunctivae normal.  Cardiovascular:      Rate and Rhythm: Normal rate.  Pulmonary:     Effort: Pulmonary effort is normal.  Skin:    General: Skin is warm and dry.     Findings: No erythema or rash.  Neurological:     Mental Status: She is alert and oriented to person, place, and time.  Psychiatric:        Mood and Affect: Mood normal.        Behavior: Behavior normal.        Thought Content: Thought content normal.     Comments: Well groomed, good eye contact, normal speech and thoughts      Results for orders placed or performed in visit on 09/03/23  POCT glycosylated hemoglobin (Hb A1C)  Result Value Ref Range   Hemoglobin A1C 6.8 (A) 4.0 - 5.6 %   HbA1c POC (<> result, manual entry)     HbA1c, POC (prediabetic range)     HbA1c, POC (controlled diabetic range)    POCT urinalysis dipstick  Result Value Ref Range   Color, UA     Clarity, UA     Glucose, UA Negative Negative   Bilirubin, UA Negative    Ketones, UA Negative    Spec Grav, UA <=1.005 (A) 1.010 - 1.025   Blood, UA Trace    pH, UA 6.0 5.0 - 8.0   Protein, UA Negative Negative   Urobilinogen, UA 0.2 0.2 or 1.0 E.U./dL   Nitrite, UA Negative    Leukocytes, UA Small (1+) (A) Negative   Appearance     Odor        Assessment & Plan:   Problem List Items Addressed This Visit     Type 2 diabetes mellitus with other specified complication (HCC) - Primary    Well-controlled DM with A1c 6.8 Complications - obesity, GERD, hyperlipidemia.  Plan:  Start Mounjaro 5mg  weekly inj, has med already, previously did well with GLP1 ozempic Encourage improved lifestyle - low carb, low sugar diet, reduce portion size, continue improving regular exercise 3. Check CBG , bring log to next visit for review  F/u 6 month      Relevant Orders   POCT glycosylated hemoglobin (Hb A1C) (Completed)   Other Visit Diagnoses     Need for influenza vaccination       Relevant Orders   Flu vaccine trivalent PF, 6mos and older(Flulaval,Afluria,Fluarix,Fluzone)  (Completed)   Recurrent UTI       Relevant Orders   POCT urinalysis dipstick (Completed)   Urine Culture       Assessment and Plan    Elevated  White Blood Cell Count Reactive increase in WBCs to 12.8 from a usual range of 8.3-10.7. Possible causes include stress, infection, or other inflammatory conditions. -No immediate intervention. Followed by Hematology already  Possible Urinary Tract Infection Reports of urinary frequency and urgency, but no hematuria. -Collect urine sample for urinalysis and culture. POC UA shows sm leuks and trace blood Urine culture pending -If results are consistent with UTI, patient has antibiotics on hand to start treatment.  Right Thigh Pain Reports of burning sensation in the right thigh, possibly related to a pinched nerve (Meralgia Paresthetica). -Advise to avoid activities or positions that exacerbate the pain. -Continue Gabapentin for nerve pain relief.  Weight Loss Significant weight loss of 20-25 pounds over the past 4-6 months due to increased activity and dietary changes. -Encourage continuation of current lifestyle modifications. -Plan to start [facility] 5mg  for further weight loss and blood sugar control.  Right Gluteal Scar Indentation in the right gluteal region following a traumatic fall in May. -Recommend use of silicone sheets or scar removal creams to aid in skin healing and rehydration.  Follow-up Plan for routine follow-up in 4-6 months to monitor progress.        No orders of the defined types were placed in this encounter.     Follow up plan: Return in about 6 months (around 03/02/2024) for 6 month follow-up DM A1c, weight, updates.  Saralyn Pilar, DO [facility] 09/03/2023, 11:18 AM

## 2023-09-03 NOTE — Patient Instructions (Addendum)
Thank you for coming to the office today.    Please schedule a Follow-up Appointment to: Return in about 6 months (around 03/02/2024) for 6 month follow-up DM A1c, weight, updates.  If you have any other questions or concerns, please feel free to call the office or send a message through MyChart. You may also schedule an earlier appointment if necessary.  Additionally, you may be receiving a survey about your experience at our office within a few days to 1 week by e-mail or mail. We value your feedback.  Saralyn Pilar, DO [facility], [place]

## 2023-09-03 NOTE — Assessment & Plan Note (Signed)
Well-controlled DM with A1c 6.8 Complications - obesity, GERD, hyperlipidemia.  Plan:  Start Mounjaro 5mg  weekly inj, has med already, previously did well with GLP1 ozempic Encourage improved lifestyle - low carb, low sugar diet, reduce portion size, continue improving regular exercise 3. Check CBG , bring log to next visit for review  F/u 6 month

## 2023-09-10 DIAGNOSIS — I10 Essential (primary) hypertension: Secondary | ICD-10-CM | POA: Diagnosis not present

## 2023-09-10 DIAGNOSIS — E119 Type 2 diabetes mellitus without complications: Secondary | ICD-10-CM | POA: Diagnosis not present

## 2023-09-10 DIAGNOSIS — H16223 Keratoconjunctivitis sicca, not specified as Sjogren's, bilateral: Secondary | ICD-10-CM | POA: Diagnosis not present

## 2023-09-10 DIAGNOSIS — H40053 Ocular hypertension, bilateral: Secondary | ICD-10-CM | POA: Diagnosis not present

## 2023-09-15 ENCOUNTER — Other Ambulatory Visit: Payer: Self-pay | Admitting: Internal Medicine

## 2023-09-15 DIAGNOSIS — I10 Essential (primary) hypertension: Secondary | ICD-10-CM

## 2023-09-16 NOTE — Telephone Encounter (Signed)
Requested Prescriptions  Pending Prescriptions Disp Refills   atenolol (TENORMIN) 50 MG tablet [Pharmacy Med Name: ATENOLOL 50MG  TABLETS] 90 tablet 0    Sig: TAKE 1 TABLET(50 MG) BY MOUTH DAILY     Cardiovascular: Beta Blockers 2 Passed - 09/15/2023  8:12 AM      Passed - Cr in normal range and within 360 days    Creat  Date Value Ref Range Status  11/08/2020 0.64 0.50 - 1.10 mg/dL Final   Creatinine, Ser  Date Value Ref Range Status  02/27/2023 0.62 0.57 - 1.00 mg/dL Final   Creatinine, Urine  Date Value Ref Range Status  10/19/2022 24 20 - 275 mg/dL Final         Passed - Last BP in normal range    BP Readings from Last 1 Encounters:  09/03/23 114/74         Passed - Last Heart Rate in normal range    Pulse Readings from Last 1 Encounters:  09/03/23 81         Passed - Valid encounter within last 6 months    Recent Outpatient Visits           1 week ago Type 2 diabetes mellitus with other specified complication, without long-term current use of insulin [facility])   [place] [facility] Smitty Cords, DO   7 months ago Recurrent UTI   [facility] [place], Netta Neat, DO   8 months ago Type 2 diabetes mellitus with other specified complication, without long-term current use of insulin [facility])   [place] [facility] Smitty Cords, DO   10 months ago Gross hematuria   [place] [facility] Smitty Cords, DO   11 months ago Type 2 diabetes mellitus with other specified complication, without long-term current use of insulin [facility])   [place] [facility] [place], Netta Neat, [place]

## 2023-09-23 ENCOUNTER — Other Ambulatory Visit: Payer: Self-pay | Admitting: Family Medicine

## 2023-09-23 DIAGNOSIS — K219 Gastro-esophageal reflux disease without esophagitis: Secondary | ICD-10-CM

## 2023-09-23 NOTE — Telephone Encounter (Signed)
Requested Prescriptions  Pending Prescriptions Disp Refills   pantoprazole (PROTONIX) 20 MG tablet [Pharmacy Med Name: PANTOPRAZOLE 20MG  TABLETS] 90 tablet 0    Sig: TAKE 1 TABLET(20 MG) BY MOUTH DAILY     Gastroenterology: Proton Pump Inhibitors Passed - 09/23/2023  8:09 AM      Passed - Valid encounter within last 12 months    Recent Outpatient Visits           2 weeks ago Type 2 diabetes mellitus with other specified complication, without long-term current use of insulin [facility])   [facility] [place], Netta Neat, DO   8 months ago Recurrent UTI   [facility] [place], Netta Neat, DO   8 months ago Type 2 diabetes mellitus with other specified complication, without long-term current use of insulin [facility])   [place] [facility] Smitty Cords, DO   10 months ago Gross hematuria   [place] [facility] Smitty Cords, DO   11 months ago Type 2 diabetes mellitus with other specified complication, without long-term current use of insulin [facility])   [facility] [place], Netta Neat, [place]

## 2023-09-26 ENCOUNTER — Inpatient Hospital Stay: Payer: Medicare Other | Attending: Oncology

## 2023-09-26 DIAGNOSIS — D519 Vitamin B12 deficiency anemia, unspecified: Secondary | ICD-10-CM | POA: Diagnosis not present

## 2023-09-26 DIAGNOSIS — D509 Iron deficiency anemia, unspecified: Secondary | ICD-10-CM

## 2023-09-26 DIAGNOSIS — G894 Chronic pain syndrome: Secondary | ICD-10-CM | POA: Diagnosis not present

## 2023-09-26 DIAGNOSIS — M545 Low back pain, unspecified: Secondary | ICD-10-CM | POA: Diagnosis not present

## 2023-09-26 DIAGNOSIS — N390 Urinary tract infection, site not specified: Secondary | ICD-10-CM | POA: Diagnosis not present

## 2023-09-26 MED ORDER — CYANOCOBALAMIN 1000 MCG/ML IJ SOLN
1000.0000 ug | INTRAMUSCULAR | Status: DC
  Administered 2023-09-26: 1000 ug via INTRAMUSCULAR
  Filled 2023-09-26: qty 1

## 2023-10-03 ENCOUNTER — Encounter: Payer: Self-pay | Admitting: Family Medicine

## 2023-10-03 ENCOUNTER — Other Ambulatory Visit: Payer: Self-pay | Admitting: Family Medicine

## 2023-10-03 DIAGNOSIS — E1169 Type 2 diabetes mellitus with other specified complication: Secondary | ICD-10-CM

## 2023-10-03 NOTE — Telephone Encounter (Signed)
Requested Prescriptions  Refused Prescriptions Disp Refills   OZEMPIC, 1 MG/DOSE, 4 MG/3ML SOPN [Pharmacy Med Name: OZEMPIC 1MG  PER DOSE (4MG /3ML) PFP] 9 mL 1    Sig: INJECT 1MG  UNDER THE SKIN ONE DAY A WEEK     Endocrinology:  Diabetes - GLP-1 Receptor Agonists - semaglutide Failed - 10/03/2023  7:37 AM      Failed - HBA1C in normal range and within 180 days    Hemoglobin A1C  Date Value Ref Range Status  09/03/2023 6.8 (A) 4.0 - 5.6 % Final   Hgb A1c MFr Bld  Date Value Ref Range Status  11/08/2020 6.6 (H) <5.7 % of total Hgb Final    Comment:    For someone without known diabetes, a hemoglobin A1c value of 6.5% or greater indicates that they may have  diabetes and this should be confirmed with a follow-up  test. . For someone with known diabetes, a value <7% indicates  that their diabetes is well controlled and a value  greater than or equal to 7% indicates suboptimal  control. A1c targets should be individualized based on  duration of diabetes, age, comorbid conditions, and  other considerations. . Currently, no consensus exists regarding use of hemoglobin A1c for diagnosis of diabetes for children. .          Passed - Cr in normal range and within 360 days    Creat  Date Value Ref Range Status  11/08/2020 0.64 0.50 - 1.10 mg/dL Final   Creatinine, Ser  Date Value Ref Range Status  02/27/2023 0.62 0.57 - 1.00 mg/dL Final   Creatinine, Urine  Date Value Ref Range Status  10/19/2022 24 20 - 275 mg/dL Final         Passed - Valid encounter within last 6 months    Recent Outpatient Visits           1 month ago Type 2 diabetes mellitus with other specified complication, without long-term current use of insulin [facility])   [place] [facility] [place], Netta Neat, DO   8 months ago Recurrent UTI   [facility] Smitty Cords, DO   9 months ago Type 2 diabetes mellitus with other specified complication,  without long-term current use of insulin [facility])   [place] [facility] Smitty Cords, DO   10 months ago Gross hematuria   [place] [facility] Smitty Cords, DO   11 months ago Type 2 diabetes mellitus with other specified complication, without long-term current use of insulin [facility])   [place] [facility] [place], Netta Neat, [place]

## 2023-10-06 ENCOUNTER — Telehealth: Payer: Medicare Other | Admitting: Physician Assistant

## 2023-10-06 DIAGNOSIS — R051 Acute cough: Secondary | ICD-10-CM | POA: Diagnosis not present

## 2023-10-06 DIAGNOSIS — J22 Unspecified acute lower respiratory infection: Secondary | ICD-10-CM

## 2023-10-06 MED ORDER — PSEUDOEPH-BROMPHEN-DM 30-2-10 MG/5ML PO SYRP: 5.0000 mL | ORAL_SOLUTION | Freq: Four times a day (QID) | ORAL | 0 refills | Status: DC | PRN

## 2023-10-06 MED ORDER — ALBUTEROL SULFATE HFA 108 (90 BASE) MCG/ACT IN AERS
2.0000 | INHALATION_SPRAY | Freq: Four times a day (QID) | RESPIRATORY_TRACT | 0 refills | Status: AC | PRN
Start: 2023-10-06 — End: ?

## 2023-10-06 MED ORDER — AZITHROMYCIN 250 MG PO TABS: ORAL_TABLET | ORAL | 0 refills | Status: DC

## 2023-10-06 NOTE — Progress Notes (Signed)
Virtual Visit Consent   Amanda Webb, you are scheduled for a virtual visit with a [facility] provider today. Just as with appointments in the office, your consent must be obtained to participate. Your consent will be active for this visit and any virtual visit you may have with one of our providers in the next 365 days. If you have a MyChart account, a copy of this consent can be sent to you electronically.  As this is a virtual visit, video technology does not allow for your provider to perform a traditional examination. This may limit your provider's ability to fully assess your condition. If your provider identifies any concerns that need to be evaluated in person or the need to arrange testing (such as labs, EKG, etc.), we will make arrangements to do so. Although advances in technology are sophisticated, we cannot ensure that it will always work on either your end or our end. If the connection with a video visit is poor, the visit may have to be switched to a telephone visit. With either a video or telephone visit, we are not always able to ensure that we have a secure connection.  By engaging in this virtual visit, you consent to the provision of healthcare and authorize for your insurance to be billed (if applicable) for the services provided during this visit. Depending on your insurance coverage, you may receive a charge related to this service.  I need to obtain your verbal consent now. Are you willing to proceed with your visit today? [name] has provided verbal consent on 10/06/2023 for a virtual visit (video or telephone). Amanda Webb, [place]  Date: 10/06/2023 9:00 AM  Virtual Visit via Video Note   I, Amanda Webb, connected with  KENZLI CORNELLA  ([id number], 1971-03-07) on 10/06/23 at  9:00 AM EDT by a video-enabled telemedicine application and verified that I am speaking with the correct person using two identifiers.  Location: Patient: Virtual Visit Location Patient:  Home Provider: Virtual Visit Location Provider: Home Office   I discussed the limitations of evaluation and management by telemedicine and the availability of in person appointments. The patient expressed understanding and agreed to proceed.    History of Present Illness: Amanda Webb is a 52 y.o. who identifies as a female who was assigned female at birth, and is being seen today for productive cough, low grade fever, just generally not feeling well.  HPI: 52 y/o F with h/o HTN and DM c/o productive cough, low grade fever, and generally not feeling well. Denies SOB, wheezing, CP.   URI  Associated symptoms include coughing.  Cough    Problems:  Patient Active Problem List   Diagnosis Date Noted   Major depressive disorder, recurrent, in partial remission (HCC) 11/08/2020   Cough 10/21/2020   Status post laparoscopic cholecystectomy 07/28/2020   Morbid obesity (HCC) 07/05/2020   Hepatic steatosis 06/23/2020   Lymphedema 03/09/2020   Chronic venous insufficiency 03/09/2020   Bilateral lower extremity edema 02/16/2020   Female cystocele 10/21/2018   Urinary incontinence, mixed 10/21/2018   B12 deficiency 04/08/2018   Iron deficiency anemia 03/14/2018   GAD (generalized anxiety disorder) 03/10/2018   Acute right-sided low back pain 10/28/2017   Tobacco abuse 05/09/2017   Multiple thyroid nodules 12/28/2016   Simple chronic bronchitis (HCC) 12/13/2016   BMI 40.0-44.9, adult (HCC) 09/26/2016   HLD (hyperlipidemia) 09/26/2016   Vitamin D deficiency 09/26/2016   Type 2 diabetes mellitus with other specified complication (HCC) 06/17/2015  Candidiasis of skin and nail 02/21/2015   Candidal intertrigo 02/21/2015   Cellulitis of right lower extremity 02/18/2015   Benign cyst of right kidney 01/17/2015   Chronic sinusitis 11/15/2014   Whiplash injury 09/03/2014   Heart palpitations 04/02/2014   Palpitations 04/02/2014   Allergic rhinitis 12/31/2013   Spondylosis of lumbar  region without myelopathy or radiculopathy 12/28/2013   Chronic low back pain 09/28/2013   Other fatigue 09/28/2013   Hypothyroidism 09/28/2013   Alteration of body temperature 09/28/2013   Other fatigue 09/28/2013   Plantar fasciitis, bilateral 07/10/2013   Tarsal tunnel syndrome of right side 07/10/2013   Plantar fascial fibromatosis 07/10/2013   Encounter for surveillance of injectable contraceptive 05/13/2013   Diuresis excessive 03/24/2013   Polyuria 03/24/2013   Abnormal uterine and vaginal bleeding, unspecified 03/24/2013   Arthritis, multiple joint involvement 03/17/2013   Fibroblastic disorder 07/28/2012   Borderline personality disorder (HCC) 12/17/2010   Borderline personality disorder in adult [facility]) 12/17/2010   Essential hypertension 02/18/2010   Carpal tunnel syndrome on both sides 12/17/2006   Major depressive disorder, single episode 02/21/1998   Major depressive disorder, single episode 02/21/1998   Disk prolapse 04/17/1995   Depression with anxiety 03/18/1995   OCD (obsessive compulsive disorder) 03/18/1995   Severe anxiety with panic 03/18/1995    Allergies:  Allergies  Allergen Reactions   Bee Venom Anaphylaxis, Hives and Swelling    Carries Epi pen.    Cat Hair Extract Itching, Other (See Comments) and Swelling    Allergic to trees, nuts, wheat, grass, cats & dogs - itchy watery eyes, swelling. Uses Zyrtec & Flonase & Benadryl if really bad. Used to get allergy shots. Allergic to trees, nuts, wheat, grass, cats & dogs - itchy watery eyes, swelling. Uses Zyrtec & Flonase & Benadryl if really bad. Used to get allergy shots.   Dog Epithelium Cough and Shortness Of Breath   Dog Epithelium (Canis Lupus Familiaris) Shortness Of Breath   Dog Fennel Cough and Shortness Of Breath   Dog Fennel Allergy Skin Test Shortness Of Breath   Dust Mite Extract Cough and Shortness Of Breath   Tetracyclines & Related Nausea And Vomiting   Lactose Diarrhea and Nausea And  Vomiting   Milk (Cow) Diarrhea and Nausea And Vomiting   Milk Protein Diarrhea and Nausea And Vomiting   Milk-Related Compounds Diarrhea and Nausea And Vomiting   Tape Other (See Comments) and Rash    Needs to use paper tape. Breaks out with severe rash, pulls skin off when using adhesive.   Wound Dressing Adhesive Rash    Other reaction(s): Other Needs to use paper tape. Breaks out with severe rash, pulls skin off when using adhesive.  Needs to use paper tape. Breaks out with severe rash, pulls skin off when using adhesive. Needs to use paper tape. Breaks out with severe rash, pulls skin off when using adhesive.   Medications:  Current Outpatient Medications:    albuterol (VENTOLIN HFA) 108 (90 Base) MCG/ACT inhaler, Inhale 2 puffs into the lungs every 6 (six) hours as needed for wheezing or shortness of breath., Disp: 8 g, Rfl: 0   azithromycin (ZITHROMAX) 250 MG tablet, Take 2 tablets on day 1, then 1 tablet daily on days 2 through 5, Disp: 6 tablet, Rfl: 0   brompheniramine-pseudoephedrine-DM 30-2-10 MG/5ML syrup, Take 5 mLs by mouth 4 (four) times daily as needed., Disp: 120 mL, Rfl: 0   Accu-Chek FastClix Lancets MISC, USE TO CHECK BLOOD SUGAR UP TO TWICE  DAILY AS DIRECTED, Disp: 102 each, Rfl: 5   albuterol (PROVENTIL) (2.5 MG/3ML) 0.083% nebulizer solution, Inhale 3ml (one vial) via nebulized route every four hours around the clock for 24 hours, followed by one vial every 6 hours for the next 24 hours. After 48 hours, take 3mL via neb every 6-8 hours PRN cough, wheeze, Disp: 150 mL, Rfl: 1   albuterol (VENTOLIN HFA) 108 (90 Base) MCG/ACT inhaler, INHALE 1 TO 2 PUFFS BY MOUTH EVERY 4 TO 6 HOURS AS NEEDED FOR SHORTNESS OF BREATH, Disp: 18 g, Rfl: 2   ARIPiprazole (ABILIFY) 2 MG tablet, Take 2 mg by mouth every morning. , Disp: , Rfl:    atenolol (TENORMIN) 50 MG tablet, TAKE 1 TABLET(50 MG) BY MOUTH DAILY, Disp: 90 tablet, Rfl: 0   azelastine (ASTELIN) 0.1 % nasal spray, USE 1 SPRAY IN  EACH NOSTRIL TWICE DAILY AS DIRECTED, Disp: 30 mL, Rfl: 5   Cetirizine HCl 10 MG CAPS, Take 10 mg by mouth daily with lunch. , Disp: , Rfl:    Cyanocobalamin (B-12 COMPLIANCE INJECTION IJ), Inject 1 Dose as directed every 30 (thirty) days., Disp: , Rfl:    fluticasone (FLONASE) 50 MCG/ACT nasal spray, SHAKE LIQUID AND USE 2 SPRAYS IN EACH NOSTRIL EVERY DAY, Disp: 48 g, Rfl: 0   fluvoxaMINE (LUVOX) 100 MG tablet, Take 150 mg by mouth every morning. , Disp: , Rfl:    gabapentin (NEURONTIN) 100 MG capsule, Take by mouth., Disp: , Rfl:    glucose blood (ACCU-CHEK GUIDE) test strip, CHECK BLOOD SUGAR UP TO EVERY DAY AS DIRECTED, Disp: 200 strip, Rfl: 5   ketoconazole (NIZORAL) 2 % shampoo, APPLY EXTERNALLY 2 TIMES A WEEK, Disp: 120 mL, Rfl: 2   lamoTRIgine (LAMICTAL) 200 MG tablet, Take by mouth., Disp: , Rfl:    levothyroxine (SYNTHROID) 50 MCG tablet, TAKE 1 TABLET(50 MCG) BY MOUTH DAILY BEFORE BREAKFAST, Disp: 90 tablet, Rfl: 1   medroxyPROGESTERone (DEPO-PROVERA) 150 MG/ML injection, BRING DEPO INJECTION TO OFFICE EACH VISIT, Disp: 1 mL, Rfl: 2   MOUNJARO 5 MG/0.5ML Pen, Inject 5 mg into the skin once a week., Disp: 2 mL, Rfl: 2   Multiple Vitamin (MULTIVITAMIN WITH MINERALS) TABS tablet, Take 1 tablet by mouth daily., Disp: , Rfl:    nystatin cream (MYCOSTATIN), Apply 1 application. topically 2 (two) times daily as needed (rash)., Disp: 30 g, Rfl: 5   ondansetron (ZOFRAN-ODT) 4 MG disintegrating tablet, Take 1 tablet (4 mg total) by mouth every 8 (eight) hours as needed for nausea or vomiting., Disp: 20 tablet, Rfl: 0   pantoprazole (PROTONIX) 20 MG tablet, TAKE 1 TABLET(20 MG) BY MOUTH DAILY, Disp: 90 tablet, Rfl: 0   sulfamethoxazole-trimethoprim (BACTRIM DS) 800-160 MG tablet, Take 1 tablet by mouth 2 (two) times daily., Disp: 14 tablet, Rfl: 0   TRELEGY ELLIPTA 100-62.5-25 MCG/ACT AEPB, INHALE 1 PUFF INTO THE LUNGS DAILY, Disp: 180 each, Rfl: 1   tretinoin (RETIN-A) 0.025 % cream, APPLY  EXTERNALLY TO THE AFFECTED AREA AT BEDTIME AS NEEDED, Disp: 45 g, Rfl: 2 No current facility-administered medications for this visit.  Facility-Administered Medications Ordered in Other Visits:    cyanocobalamin (VITAMIN B12) injection 1,000 mcg, 1,000 mcg, Intramuscular, Once, Creig Hines, MD  Observations/Objective: Patient is well-developed, well-nourished in no acute distress.  Resting comfortably  at home.  Head is normocephalic, atraumatic.  No labored breathing.  Speech is clear and coherent with logical content.  Patient is alert and oriented at baseline.    Assessment  and Plan: 1. Lower respiratory infection - azithromycin (ZITHROMAX) 250 MG tablet; Take 2 tablets on day 1, then 1 tablet daily on days 2 through 5  Dispense: 6 tablet; Refill: 0  2. Acute cough - albuterol (VENTOLIN HFA) 108 (90 Base) MCG/ACT inhaler; Inhale 2 puffs into the lungs every 6 (six) hours as needed for wheezing or shortness of breath.  Dispense: 8 g; Refill: 0 - brompheniramine-pseudoephedrine-DM 30-2-10 MG/5ML syrup; Take 5 mLs by mouth 4 (four) times daily as needed.  Dispense: 120 mL; Refill: 0  Stay well hydrated Start medicines as prescribed Continue to watch for worsening symptoms If symptoms don't improve then follow up with PCP or go to [facility] clinic for further evaluation and management. Pt requested a work excuse note. Pt verbalized understanding and in agreement.    Follow Up Instructions: I discussed the assessment and treatment plan with the patient. The patient was provided an opportunity to ask questions and all were answered. The patient agreed with the plan and demonstrated an understanding of the instructions.  A copy of instructions were sent to the patient via MyChart unless otherwise noted below.   Patient has requested to receive PHI (AVS, Work Notes, etc) pertaining to this video visit through e-mail as they are currently without active MyChart. They have voiced understand  that email is not considered secure and their health information could be viewed by someone other than the patient.   The patient was advised to call back or seek an in-person evaluation if the symptoms worsen or if the condition fails to improve as anticipated.    Amanda Better, PA-C

## 2023-10-06 NOTE — Patient Instructions (Signed)
Amanda Webb, thank you for joining [name], PA-C for today's virtual visit.  While this provider is not your primary care provider (PCP), if your PCP is located in our provider database this encounter information will be shared with them immediately following your visit.   A [facility] MyChart account gives you access to today's visit and all your visits, tests, and labs performed at [facility] " click here if you don't have a [name] account or go to [email or web address]  Consent: (Patient) Amanda Webb provided verbal consent for this virtual visit at the beginning of the encounter.  Current Medications:  Current Outpatient Medications:    albuterol (VENTOLIN HFA) 108 (90 Base) MCG/ACT inhaler, Inhale 2 puffs into the lungs every 6 (six) hours as needed for wheezing or shortness of breath., Disp: 8 g, Rfl: 0   azithromycin (ZITHROMAX) 250 MG tablet, Take 2 tablets on day 1, then 1 tablet daily on days 2 through 5, Disp: 6 tablet, Rfl: 0   brompheniramine-pseudoephedrine-DM 30-2-10 MG/5ML syrup, Take 5 mLs by mouth 4 (four) times daily as needed., Disp: 120 mL, Rfl: 0   Accu-Chek FastClix Lancets MISC, USE TO CHECK BLOOD SUGAR UP TO TWICE DAILY AS DIRECTED, Disp: 102 each, Rfl: 5   albuterol (PROVENTIL) (2.5 MG/3ML) 0.083% nebulizer solution, Inhale 3ml (one vial) via nebulized route every four hours around the clock for 24 hours, followed by one vial every 6 hours for the next 24 hours. After 48 hours, take 3mL via neb every 6-8 hours PRN cough, wheeze, Disp: 150 mL, Rfl: 1   albuterol (VENTOLIN HFA) 108 (90 Base) MCG/ACT inhaler, INHALE 1 TO 2 PUFFS BY MOUTH EVERY 4 TO 6 HOURS AS NEEDED FOR SHORTNESS OF BREATH, Disp: 18 g, Rfl: 2   ARIPiprazole (ABILIFY) 2 MG tablet, Take 2 mg by mouth every morning. , Disp: , Rfl:    atenolol (TENORMIN) 50 MG tablet, TAKE 1 TABLET(50 MG) BY MOUTH DAILY, Disp: 90 tablet, Rfl: 0   azelastine (ASTELIN) 0.1 % nasal spray,  USE 1 SPRAY IN EACH NOSTRIL TWICE DAILY AS DIRECTED, Disp: 30 mL, Rfl: 5   Cetirizine HCl 10 MG CAPS, Take 10 mg by mouth daily with lunch. , Disp: , Rfl:    Cyanocobalamin (B-12 COMPLIANCE INJECTION IJ), Inject 1 Dose as directed every 30 (thirty) days., Disp: , Rfl:    fluticasone (FLONASE) 50 MCG/ACT nasal spray, SHAKE LIQUID AND USE 2 SPRAYS IN EACH NOSTRIL EVERY DAY, Disp: 48 g, Rfl: 0   fluvoxaMINE (LUVOX) 100 MG tablet, Take 150 mg by mouth every morning. , Disp: , Rfl:    gabapentin (NEURONTIN) 100 MG capsule, Take by mouth., Disp: , Rfl:    glucose blood (ACCU-CHEK GUIDE) test strip, CHECK BLOOD SUGAR UP TO EVERY DAY AS DIRECTED, Disp: 200 strip, Rfl: 5   ketoconazole (NIZORAL) 2 % shampoo, APPLY EXTERNALLY 2 TIMES A WEEK, Disp: 120 mL, Rfl: 2   lamoTRIgine (LAMICTAL) 200 MG tablet, Take by mouth., Disp: , Rfl:    levothyroxine (SYNTHROID) 50 MCG tablet, TAKE 1 TABLET(50 MCG) BY MOUTH DAILY BEFORE BREAKFAST, Disp: 90 tablet, Rfl: 1   medroxyPROGESTERone (DEPO-PROVERA) 150 MG/ML injection, BRING DEPO INJECTION TO OFFICE EACH VISIT, Disp: 1 mL, Rfl: 2   MOUNJARO 5 MG/0.5ML Pen, Inject 5 mg into the skin once a week., Disp: 2 mL, Rfl: 2   Multiple Vitamin (MULTIVITAMIN WITH MINERALS) TABS tablet, Take 1 tablet by mouth daily., Disp: , Rfl:    nystatin  cream (MYCOSTATIN), Apply 1 application. topically 2 (two) times daily as needed (rash)., Disp: 30 g, Rfl: 5   ondansetron (ZOFRAN-ODT) 4 MG disintegrating tablet, Take 1 tablet (4 mg total) by mouth every 8 (eight) hours as needed for nausea or vomiting., Disp: 20 tablet, Rfl: 0   pantoprazole (PROTONIX) 20 MG tablet, TAKE 1 TABLET(20 MG) BY MOUTH DAILY, Disp: 90 tablet, Rfl: 0   sulfamethoxazole-trimethoprim (BACTRIM DS) 800-160 MG tablet, Take 1 tablet by mouth 2 (two) times daily., Disp: 14 tablet, Rfl: 0   TRELEGY ELLIPTA 100-62.5-25 MCG/ACT AEPB, INHALE 1 PUFF INTO THE LUNGS DAILY, Disp: 180 each, Rfl: 1   tretinoin (RETIN-A) 0.025 %  cream, APPLY EXTERNALLY TO THE AFFECTED AREA AT BEDTIME AS NEEDED, Disp: 45 g, Rfl: 2 No current facility-administered medications for this visit.  Facility-Administered Medications Ordered in Other Visits:    cyanocobalamin (VITAMIN B12) injection 1,000 mcg, 1,000 mcg, Intramuscular, Once, Creig Hines, MD   Medications ordered in this encounter:  Meds ordered this encounter  Medications   albuterol (VENTOLIN HFA) 108 (90 Base) MCG/ACT inhaler    Sig: Inhale 2 puffs into the lungs every 6 (six) hours as needed for wheezing or shortness of breath.    Dispense:  8 g    Refill:  0    Order Specific Question:   Supervising Provider    Answer:   Merrilee Jansky [[id number]]   azithromycin (ZITHROMAX) 250 MG tablet    Sig: Take 2 tablets on day 1, then 1 tablet daily on days 2 through 5    Dispense:  6 tablet    Refill:  0    Order Specific Question:   Supervising Provider    Answer:   Merrilee Jansky [[id number]]   brompheniramine-pseudoephedrine-DM 30-2-10 MG/5ML syrup    Sig: Take 5 mLs by mouth 4 (four) times daily as needed.    Dispense:  120 mL    Refill:  0    Order Specific Question:   Supervising Provider    Answer:   Merrilee Jansky [[id number]]     *If you need refills on other medications prior to your next appointment, please contact your pharmacy*  Follow-Up: Call back or seek an in-person evaluation if the symptoms worsen or if the condition fails to improve as anticipated.  [facility] [phone]  Other Instructions Stay well hydrated Start medicines as prescribed Continue to watch for worsening symptoms If symptoms don't improve then follow up with PCP or go to [facility] clinic for further evaluation and management.   If you have been instructed to have an in-person evaluation today at a local Urgent Care facility, please use the link below. It will take you to a list of all of our available [place] [facility], including address, phone number and  hours of operation. Please do not delay care.  [facility]  If you or a family member do not have a primary care provider, use the link below to schedule a visit and establish care. When you choose a [facility] primary care physician or advanced practice provider, you gain a long-term partner in health. Find a Primary Care Provider  Learn more about [facility]'s in-office and virtual care options:  - Get Care Now

## 2023-10-11 ENCOUNTER — Telehealth: Payer: Medicare Other | Admitting: Physician Assistant

## 2023-10-11 DIAGNOSIS — J22 Unspecified acute lower respiratory infection: Secondary | ICD-10-CM

## 2023-10-11 MED ORDER — PREDNISONE 20 MG PO TABS: 40.0000 mg | ORAL_TABLET | Freq: Every day | ORAL | 0 refills | Status: DC

## 2023-10-11 MED ORDER — LEVOFLOXACIN 500 MG PO TABS: 500.0000 mg | ORAL_TABLET | Freq: Every day | ORAL | 0 refills | Status: AC

## 2023-10-11 NOTE — Progress Notes (Signed)
Virtual Visit Consent   Amanda Webb, you are scheduled for a virtual visit with a [facility] provider today. Just as with appointments in the office, your consent must be obtained to participate. Your consent will be active for this visit and any virtual visit you may have with one of our providers in the next 365 days. If you have a MyChart account, a copy of this consent can be sent to you electronically.  As this is a virtual visit, video technology does not allow for your provider to perform a traditional examination. This may limit your provider's ability to fully assess your condition. If your provider identifies any concerns that need to be evaluated in person or the need to arrange testing (such as labs, EKG, etc.), we will make arrangements to do so. Although advances in technology are sophisticated, we cannot ensure that it will always work on either your end or our end. If the connection with a video visit is poor, the visit may have to be switched to a telephone visit. With either a video or telephone visit, we are not always able to ensure that we have a secure connection.  By engaging in this virtual visit, you consent to the provision of healthcare and authorize for your insurance to be billed (if applicable) for the services provided during this visit. Depending on your insurance coverage, you may receive a charge related to this service.  I need to obtain your verbal consent now. Are you willing to proceed with your visit today? Amanda Webb has provided verbal consent on 10/11/2023 for a virtual visit (video or telephone). Margaretann Loveless, PA-C  Date: 10/11/2023 8:23 AM  Virtual Visit via Video Note   I, Margaretann Loveless, connected with  Amanda Webb  ([id number], 01/20/1971) on 10/11/23 at  8:15 AM EDT by a video-enabled telemedicine application and verified that I am speaking with the correct person using two identifiers.  Location: Patient: Virtual Visit Location  Patient: Home Provider: Virtual Visit Location Provider: Home Office   I discussed the limitations of evaluation and management by telemedicine and the availability of in person appointments. The patient expressed understanding and agreed to proceed.    History of Present Illness: Amanda Webb is a 52 y.o. who identifies as a female who was assigned female at birth, and is being seen today for cough and congestion.  HPI: Cough This is a new problem. The current episode started 1 to 4 weeks ago (Seen virtually on 10/06/23 and started on Azithromycin, Bromfed DM, and Albuterol inhaler and nebulizer refilled). The problem has been gradually worsening (feels antibiotic kept at bay). The problem occurs every few minutes. The cough is Non-productive. Associated symptoms include chest pain (tightness), myalgias (neck and back pain from coughing), shortness of breath and wheezing. Pertinent negatives include no chills, ear congestion, ear pain, fever, headaches, hemoptysis, nasal congestion, postnasal drip, rhinorrhea or sore throat. The symptoms are aggravated by lying down. She has tried a beta-agonist inhaler and prescription cough suppressant (zpack, bromfed dm, albuterol) for the symptoms. The treatment provided no relief. Her past medical history is significant for asthma, bronchitis and pneumonia.     Problems:  Patient Active Problem List   Diagnosis Date Noted   Major depressive disorder, recurrent, in partial remission (HCC) 11/08/2020   Cough 10/21/2020   Status post laparoscopic cholecystectomy 07/28/2020   Morbid obesity (HCC) 07/05/2020   Hepatic steatosis 06/23/2020   Lymphedema 03/09/2020   Chronic venous insufficiency  03/09/2020   Bilateral lower extremity edema 02/16/2020   Female cystocele 10/21/2018   Urinary incontinence, mixed 10/21/2018   B12 deficiency 04/08/2018   Iron deficiency anemia 03/14/2018   GAD (generalized anxiety disorder) 03/10/2018   Acute right-sided low  back pain 10/28/2017   Tobacco abuse 05/09/2017   Multiple thyroid nodules 12/28/2016   Simple chronic bronchitis (HCC) 12/13/2016   BMI 40.0-44.9, adult (HCC) 09/26/2016   HLD (hyperlipidemia) 09/26/2016   Vitamin D deficiency 09/26/2016   Type 2 diabetes mellitus with other specified complication (HCC) 06/17/2015   Candidiasis of skin and nail 02/21/2015   Candidal intertrigo 02/21/2015   Cellulitis of right lower extremity 02/18/2015   Benign cyst of right kidney 01/17/2015   Chronic sinusitis 11/15/2014   Whiplash injury 09/03/2014   Heart palpitations 04/02/2014   Palpitations 04/02/2014   Allergic rhinitis 12/31/2013   Spondylosis of lumbar region without myelopathy or radiculopathy 12/28/2013   Chronic low back pain 09/28/2013   Other fatigue 09/28/2013   Hypothyroidism 09/28/2013   Alteration of body temperature 09/28/2013   Other fatigue 09/28/2013   Plantar fasciitis, bilateral 07/10/2013   Tarsal tunnel syndrome of right side 07/10/2013   Plantar fascial fibromatosis 07/10/2013   Encounter for surveillance of injectable contraceptive 05/13/2013   Diuresis excessive 03/24/2013   Polyuria 03/24/2013   Abnormal uterine and vaginal bleeding, unspecified 03/24/2013   Arthritis, multiple joint involvement 03/17/2013   Fibroblastic disorder 07/28/2012   Borderline personality disorder (HCC) 12/17/2010   Borderline personality disorder in adult [facility]) 12/17/2010   Essential hypertension 02/18/2010   Carpal tunnel syndrome on both sides 12/17/2006   Major depressive disorder, single episode 02/21/1998   Major depressive disorder, single episode 02/21/1998   Disk prolapse 04/17/1995   Depression with anxiety 03/18/1995   OCD (obsessive compulsive disorder) 03/18/1995   Severe anxiety with panic 03/18/1995    Allergies:  Allergies  Allergen Reactions   Bee Venom Anaphylaxis, Hives and Swelling    Carries Epi pen.    Cat Hair Extract Itching, Other (See Comments) and  Swelling    Allergic to trees, nuts, wheat, grass, cats & dogs - itchy watery eyes, swelling. Uses Zyrtec & Flonase & Benadryl if really bad. Used to get allergy shots. Allergic to trees, nuts, wheat, grass, cats & dogs - itchy watery eyes, swelling. Uses Zyrtec & Flonase & Benadryl if really bad. Used to get allergy shots.   Dog Epithelium Cough and Shortness Of Breath   Dog Epithelium (Canis Lupus Familiaris) Shortness Of Breath   Dog Fennel Cough and Shortness Of Breath   Dog Fennel Allergy Skin Test Shortness Of Breath   Dust Mite Extract Cough and Shortness Of Breath   Tetracyclines & Related Nausea And Vomiting   Lactose Diarrhea and Nausea And Vomiting   Milk (Cow) Diarrhea and Nausea And Vomiting   Milk Protein Diarrhea and Nausea And Vomiting   Milk-Related Compounds Diarrhea and Nausea And Vomiting   Tape Other (See Comments) and Rash    Needs to use paper tape. Breaks out with severe rash, pulls skin off when using adhesive.   Wound Dressing Adhesive Rash    Other reaction(s): Other Needs to use paper tape. Breaks out with severe rash, pulls skin off when using adhesive.  Needs to use paper tape. Breaks out with severe rash, pulls skin off when using adhesive. Needs to use paper tape. Breaks out with severe rash, pulls skin off when using adhesive.   Medications:  Current Outpatient Medications:  Accu-Chek FastClix Lancets MISC, USE TO CHECK BLOOD SUGAR UP TO TWICE DAILY AS DIRECTED, Disp: 102 each, Rfl: 5   albuterol (PROVENTIL) (2.5 MG/3ML) 0.083% nebulizer solution, Inhale 3ml (one vial) via nebulized route every four hours around the clock for 24 hours, followed by one vial every 6 hours for the next 24 hours. After 48 hours, take 3mL via neb every 6-8 hours PRN cough, wheeze, Disp: 150 mL, Rfl: 1   albuterol (VENTOLIN HFA) 108 (90 Base) MCG/ACT inhaler, INHALE 1 TO 2 PUFFS BY MOUTH EVERY 4 TO 6 HOURS AS NEEDED FOR SHORTNESS OF BREATH, Disp: 18 g, Rfl: 2   albuterol  (VENTOLIN HFA) 108 (90 Base) MCG/ACT inhaler, Inhale 2 puffs into the lungs every 6 (six) hours as needed for wheezing or shortness of breath., Disp: 8 g, Rfl: 0   ARIPiprazole (ABILIFY) 2 MG tablet, Take 2 mg by mouth every morning. , Disp: , Rfl:    atenolol (TENORMIN) 50 MG tablet, TAKE 1 TABLET(50 MG) BY MOUTH DAILY, Disp: 90 tablet, Rfl: 0   azelastine (ASTELIN) 0.1 % nasal spray, USE 1 SPRAY IN EACH NOSTRIL TWICE DAILY AS DIRECTED, Disp: 30 mL, Rfl: 5   brompheniramine-pseudoephedrine-DM 30-2-10 MG/5ML syrup, Take 5 mLs by mouth 4 (four) times daily as needed., Disp: 120 mL, Rfl: 0   Cetirizine HCl 10 MG CAPS, Take 10 mg by mouth daily with lunch. , Disp: , Rfl:    Cyanocobalamin (B-12 COMPLIANCE INJECTION IJ), Inject 1 Dose as directed every 30 (thirty) days., Disp: , Rfl:    fluticasone (FLONASE) 50 MCG/ACT nasal spray, SHAKE LIQUID AND USE 2 SPRAYS IN EACH NOSTRIL EVERY DAY, Disp: 48 g, Rfl: 0   fluvoxaMINE (LUVOX) 100 MG tablet, Take 150 mg by mouth every morning. , Disp: , Rfl:    gabapentin (NEURONTIN) 100 MG capsule, Take by mouth., Disp: , Rfl:    glucose blood (ACCU-CHEK GUIDE) test strip, CHECK BLOOD SUGAR UP TO EVERY DAY AS DIRECTED, Disp: 200 strip, Rfl: 5   ketoconazole (NIZORAL) 2 % shampoo, APPLY EXTERNALLY 2 TIMES A WEEK, Disp: 120 mL, Rfl: 2   lamoTRIgine (LAMICTAL) 200 MG tablet, Take by mouth., Disp: , Rfl:    levofloxacin (LEVAQUIN) 500 MG tablet, Take 1 tablet (500 mg total) by mouth daily for 7 days., Disp: 7 tablet, Rfl: 0   levothyroxine (SYNTHROID) 50 MCG tablet, TAKE 1 TABLET(50 MCG) BY MOUTH DAILY BEFORE BREAKFAST, Disp: 90 tablet, Rfl: 1   medroxyPROGESTERone (DEPO-PROVERA) 150 MG/ML injection, BRING DEPO INJECTION TO OFFICE EACH VISIT, Disp: 1 mL, Rfl: 2   MOUNJARO 5 MG/0.5ML Pen, Inject 5 mg into the skin once a week., Disp: 2 mL, Rfl: 2   Multiple Vitamin (MULTIVITAMIN WITH MINERALS) TABS tablet, Take 1 tablet by mouth daily., Disp: , Rfl:    nystatin cream  (MYCOSTATIN), Apply 1 application. topically 2 (two) times daily as needed (rash)., Disp: 30 g, Rfl: 5   ondansetron (ZOFRAN-ODT) 4 MG disintegrating tablet, Take 1 tablet (4 mg total) by mouth every 8 (eight) hours as needed for nausea or vomiting., Disp: 20 tablet, Rfl: 0   pantoprazole (PROTONIX) 20 MG tablet, TAKE 1 TABLET(20 MG) BY MOUTH DAILY, Disp: 90 tablet, Rfl: 0   predniSONE (DELTASONE) 20 MG tablet, Take 2 tablets (40 mg total) by mouth daily with breakfast., Disp: 10 tablet, Rfl: 0   TRELEGY ELLIPTA 100-62.5-25 MCG/ACT AEPB, INHALE 1 PUFF INTO THE LUNGS DAILY, Disp: 180 each, Rfl: 1   tretinoin (RETIN-A) 0.025 % cream,  APPLY EXTERNALLY TO THE AFFECTED AREA AT BEDTIME AS NEEDED, Disp: 45 g, Rfl: 2 No current facility-administered medications for this visit.  Facility-Administered Medications Ordered in Other Visits:    cyanocobalamin (VITAMIN B12) injection 1,000 mcg, 1,000 mcg, Intramuscular, Once, Creig Hines, MD  Observations/Objective: Patient is well-developed, well-nourished in no acute distress.  Resting comfortably at home.  Head is normocephalic, atraumatic.  No labored breathing.  Speech is clear and coherent with logical content.  Patient is alert and oriented at baseline.  Frequent dry coughing heard not affecting speech   Assessment and Plan: 1. Lower respiratory infection - levofloxacin (LEVAQUIN) 500 MG tablet; Take 1 tablet (500 mg total) by mouth daily for 7 days.  Dispense: 7 tablet; Refill: 0 - predniSONE (DELTASONE) 20 MG tablet; Take 2 tablets (40 mg total) by mouth daily with breakfast.  Dispense: 10 tablet; Refill: 0  - Worsening - Will treat with Levaquin and Prednisone - Continue inhalers and nebulizer as needed - Can continue Mucinex  - Push fluids.  - Rest.  - Steam and humidifier can help - Seek in person evaluation if worsening or symptoms fail to improve    Follow Up Instructions: I discussed the assessment and treatment plan with the  patient. The patient was provided an opportunity to ask questions and all were answered. The patient agreed with the plan and demonstrated an understanding of the instructions.  A copy of instructions were sent to the patient via MyChart unless otherwise noted below.    The patient was advised to call back or seek an in-person evaluation if the symptoms worsen or if the condition fails to improve as anticipated.    Margaretann Loveless, PA-C

## 2023-10-11 NOTE — Patient Instructions (Signed)
Amanda Webb, thank you for joining Margaretann Loveless, PA-C for today's virtual visit.  While this provider is not your primary care provider (PCP), if your PCP is located in our provider database this encounter information will be shared with them immediately following your visit.   A [facility] MyChart account gives you access to today's visit and all your visits, tests, and labs performed at [facility] " click here if you don't have a [facility] MyChart account or go to [email or web address]  Consent: (Patient) Amanda Webb provided verbal consent for this virtual visit at the beginning of the encounter.  Current Medications:  Current Outpatient Medications:    levofloxacin (LEVAQUIN) 500 MG tablet, Take 1 tablet (500 mg total) by mouth daily for 7 days., Disp: 7 tablet, Rfl: 0   predniSONE (DELTASONE) 20 MG tablet, Take 2 tablets (40 mg total) by mouth daily with breakfast., Disp: 10 tablet, Rfl: 0   Accu-Chek FastClix Lancets MISC, USE TO CHECK BLOOD SUGAR UP TO TWICE DAILY AS DIRECTED, Disp: 102 each, Rfl: 5   albuterol (PROVENTIL) (2.5 MG/3ML) 0.083% nebulizer solution, Inhale 3ml (one vial) via nebulized route every four hours around the clock for 24 hours, followed by one vial every 6 hours for the next 24 hours. After 48 hours, take 3mL via neb every 6-8 hours PRN cough, wheeze, Disp: 150 mL, Rfl: 1   albuterol (VENTOLIN HFA) 108 (90 Base) MCG/ACT inhaler, INHALE 1 TO 2 PUFFS BY MOUTH EVERY 4 TO 6 HOURS AS NEEDED FOR SHORTNESS OF BREATH, Disp: 18 g, Rfl: 2   albuterol (VENTOLIN HFA) 108 (90 Base) MCG/ACT inhaler, Inhale 2 puffs into the lungs every 6 (six) hours as needed for wheezing or shortness of breath., Disp: 8 g, Rfl: 0   ARIPiprazole (ABILIFY) 2 MG tablet, Take 2 mg by mouth every morning. , Disp: , Rfl:    atenolol (TENORMIN) 50 MG tablet, TAKE 1 TABLET(50 MG) BY MOUTH DAILY, Disp: 90 tablet, Rfl: 0   azelastine (ASTELIN) 0.1 % nasal spray, USE 1 SPRAY  IN EACH NOSTRIL TWICE DAILY AS DIRECTED, Disp: 30 mL, Rfl: 5   brompheniramine-pseudoephedrine-DM 30-2-10 MG/5ML syrup, Take 5 mLs by mouth 4 (four) times daily as needed., Disp: 120 mL, Rfl: 0   Cetirizine HCl 10 MG CAPS, Take 10 mg by mouth daily with lunch. , Disp: , Rfl:    Cyanocobalamin (B-12 COMPLIANCE INJECTION IJ), Inject 1 Dose as directed every 30 (thirty) days., Disp: , Rfl:    fluticasone (FLONASE) 50 MCG/ACT nasal spray, SHAKE LIQUID AND USE 2 SPRAYS IN EACH NOSTRIL EVERY DAY, Disp: 48 g, Rfl: 0   fluvoxaMINE (LUVOX) 100 MG tablet, Take 150 mg by mouth every morning. , Disp: , Rfl:    gabapentin (NEURONTIN) 100 MG capsule, Take by mouth., Disp: , Rfl:    glucose blood (ACCU-CHEK GUIDE) test strip, CHECK BLOOD SUGAR UP TO EVERY DAY AS DIRECTED, Disp: 200 strip, Rfl: 5   ketoconazole (NIZORAL) 2 % shampoo, APPLY EXTERNALLY 2 TIMES A WEEK, Disp: 120 mL, Rfl: 2   lamoTRIgine (LAMICTAL) 200 MG tablet, Take by mouth., Disp: , Rfl:    levothyroxine (SYNTHROID) 50 MCG tablet, TAKE 1 TABLET(50 MCG) BY MOUTH DAILY BEFORE BREAKFAST, Disp: 90 tablet, Rfl: 1   medroxyPROGESTERone (DEPO-PROVERA) 150 MG/ML injection, BRING DEPO INJECTION TO OFFICE EACH VISIT, Disp: 1 mL, Rfl: 2   MOUNJARO 5 MG/0.5ML Pen, Inject 5 mg into the skin once a week., Disp: 2 mL, Rfl: 2  Multiple Vitamin (MULTIVITAMIN WITH MINERALS) TABS tablet, Take 1 tablet by mouth daily., Disp: , Rfl:    nystatin cream (MYCOSTATIN), Apply 1 application. topically 2 (two) times daily as needed (rash)., Disp: 30 g, Rfl: 5   ondansetron (ZOFRAN-ODT) 4 MG disintegrating tablet, Take 1 tablet (4 mg total) by mouth every 8 (eight) hours as needed for nausea or vomiting., Disp: 20 tablet, Rfl: 0   pantoprazole (PROTONIX) 20 MG tablet, TAKE 1 TABLET(20 MG) BY MOUTH DAILY, Disp: 90 tablet, Rfl: 0   TRELEGY ELLIPTA 100-62.5-25 MCG/ACT AEPB, INHALE 1 PUFF INTO THE LUNGS DAILY, Disp: 180 each, Rfl: 1   tretinoin (RETIN-A) 0.025 % cream, APPLY  EXTERNALLY TO THE AFFECTED AREA AT BEDTIME AS NEEDED, Disp: 45 g, Rfl: 2 No current facility-administered medications for this visit.  Facility-Administered Medications Ordered in Other Visits:    cyanocobalamin (VITAMIN B12) injection 1,000 mcg, 1,000 mcg, Intramuscular, Once, Creig Hines, MD   Medications ordered in this encounter:  Meds ordered this encounter  Medications   levofloxacin (LEVAQUIN) 500 MG tablet    Sig: Take 1 tablet (500 mg total) by mouth daily for 7 days.    Dispense:  7 tablet    Refill:  0    Order Specific Question:   Supervising Provider    Answer:   Merrilee Jansky [[id number]]   predniSONE (DELTASONE) 20 MG tablet    Sig: Take 2 tablets (40 mg total) by mouth daily with breakfast.    Dispense:  10 tablet    Refill:  0    Order Specific Question:   Supervising Provider    Answer:   Merrilee Jansky [[id number]]     *If you need refills on other medications prior to your next appointment, please contact your pharmacy*  Follow-Up: Call back or seek an in-person evaluation if the symptoms worsen or if the condition fails to improve as anticipated.  [facility] [phone]  Other Instructions Community-Acquired Pneumonia, Adult Pneumonia is an infection of the lungs. It causes irritation and swelling in the airways of the lungs. Mucus and fluid may also build up inside the airways. This may cause coughing and trouble breathing. One type of pneumonia can happen while you are in a hospital. A different type can happen when you are not in a hospital (community-acquired pneumonia). What are the causes?  This condition is caused by germs (viruses, bacteria, or fungi). Some types of germs can spread from person to person. Pneumonia is not thought to spread from person to person. What increases the risk? You have a long-term (chronic) disease, such as: Disease of the lungs. This may be chronic obstructive pulmonary disease (COPD) or  asthma. Heart failure. Cystic fibrosis. Diabetes. Kidney disease. Sickle cell disease. HIV. You have other health problems, such as: Your body's defense system (immune system) is weak. A condition that may cause you to breathe in fluids from your mouth and nose. You had your spleen taken out. You do not take good care of your teeth and mouth (poor dental hygiene). You use or have used tobacco products. You go where the germs that cause this illness are common. You are older than 51 years of age. What are the signs or symptoms? A cough. A fever. Sweating or chills. Chest pain, often when you breathe deeply or cough. Breathing problems, such as: Fast breathing. Trouble breathing. Shortness of breath. Feeling tired (fatigued). Muscle aches. How is this treated? Treatment for this condition depends on many  things, such as: The cause of your illness. Your medicines. Your other health problems. Most adults can be treated at home. Sometimes, treatment must happen in a hospital. Treatment may include medicines to kill germs. Medicines may depend on which germ caused your illness. Very bad pneumonia is rare. If you get it, you may: Have a machine to help you breathe. Have fluid taken away from around your lungs. Follow these instructions at home: Medicines Take over-the-counter and prescription medicines only as told by your doctor. Take cough medicine only if you are losing sleep. Cough medicine can keep your body from taking mucus away from your lungs. If you were prescribed antibiotics, take them as told by your doctor. Do not stop taking them even if you start to feel better. Lifestyle     Do not smoke or use any products that contain nicotine or tobacco. If you need help quitting, ask your doctor. Do not drink alcohol. Eat a healthy diet. This includes a lot of vegetables, fruits, whole grains, low-fat dairy products, and low-fat (lean) protein. General  instructions  Rest a lot. Sleep for at least 8 hours each night. Sleep with your head and neck raised. Put a few pillows under your head or sleep in a reclining chair. Return to your normal activities as told by your doctor. Ask your doctor what activities are safe for you. Drink enough fluid to keep your pee (urine) pale yellow. If your throat is sore, gargle with a mixture of salt and water 3-4 times a day or as needed. To make salt water, completely dissolve -1 tsp (3-6 g) of salt in 1 cup (237 mL) of warm water. Keep all follow-up visits. How is this prevented? Getting the pneumonia shot (vaccine). These shots have different types and schedules. Ask your doctor what works best for you. Think about getting this shot if: You are older than 51 years of age. You are 69-32 years of age and: You are being treated for cancer. You have long-term lung disease. You have other problems that affect your body's defense system. Ask your doctor if you have one of these. Getting your flu shot every year. Ask your doctor which type of shot is best for you. Going to the dentist as often as told. Washing your hands often with soap and water for at least 20 seconds. If you cannot use soap and water, use hand sanitizer. Contact a doctor if: You have a fever. You lose sleep because your cough medicine does not help. Get help right away if: You are short of breath and this gets worse. You have more chest pain. Your sickness gets worse. This is very serious if: You are an older adult. Your body's defense system is weak. You cough up blood. These symptoms may be an emergency. Get help right away. Call 911. Do not wait to see if the symptoms will go away. Do not drive yourself to the hospital. Summary Pneumonia is an infection of the lungs. Community-acquired pneumonia affects people who have not been in the hospital. Certain germs can cause this infection. This condition may be treated with medicines  that kill germs. For very bad pneumonia, you may need a hospital stay and treatment to help with breathing. This information is not intended to replace advice given to you by your health care provider. Make sure you discuss any questions you have with your health care provider. Document Revised: 01/31/2022 Document Reviewed: 01/31/2022 Elsevier Patient Education  2024 [employer].  If you have been instructed to have an in-person evaluation today at a local Urgent Care facility, please use the link below. It will take you to a list of all of our available [facility], including address, phone number and hours of operation. Please do not delay care.  [facility]  If you or a family member do not have a primary care provider, use the link below to schedule a visit and establish care. When you choose a [facility] primary care physician or advanced practice provider, you gain a long-term partner in health. Find a Primary Care Provider  Learn more about [facility]'s in-office and virtual care options: [facility] - Get Care Now

## 2023-10-18 NOTE — Progress Notes (Unsigned)
    NURSE VISIT NOTE  Subjective:    Patient ID: Amanda Webb, female    DOB: [birth year], 52 y.o.   MRN: [id number]  HPI  Patient is a 52 y.o. [id number] female who presents for depo provera injection.While in office pt's BP was elevated, I let her know she stated she was very stressed and had a PCP apt tomorrow already scheduled and would let her PCP know.    Objective:    BP (!) 144/77   Pulse 90   Ht 5\' 7"  (1.702 m)   Wt 240 lb (108.9 kg)   BMI 37.59 kg/m   Last Annual: 02/27/23 . Last pap: 02/27/23 . Last Depo-Provera: 08/05/23. Side Effects if any: none. Serum HCG indicated? No . Depo-Provera 150 mg IM given by: Beverely Pace, CMA. Site: Right Ventrogluteal   Assessment:   1. Surveillance for Depo-Provera contraception      Plan:   Next appointment due between JAN 20-FEB 3     Loney Laurence, CMA

## 2023-10-21 ENCOUNTER — Ambulatory Visit (INDEPENDENT_AMBULATORY_CARE_PROVIDER_SITE_OTHER): Payer: Medicare Other

## 2023-10-21 VITALS — BP 144/77 | HR 90 | Ht 67.0 in | Wt 240.0 lb

## 2023-10-21 DIAGNOSIS — Z3042 Encounter for surveillance of injectable contraceptive: Secondary | ICD-10-CM | POA: Diagnosis not present

## 2023-10-21 MED ORDER — MEDROXYPROGESTERONE ACETATE 150 MG/ML IM SUSY
150.0000 mg | PREFILLED_SYRINGE | Freq: Once | INTRAMUSCULAR | Status: AC
  Administered 2023-10-21: 150 mg via INTRAMUSCULAR

## 2023-10-22 ENCOUNTER — Ambulatory Visit (INDEPENDENT_AMBULATORY_CARE_PROVIDER_SITE_OTHER): Payer: Medicare Other | Admitting: Family Medicine

## 2023-10-22 ENCOUNTER — Encounter: Payer: Self-pay | Admitting: Family Medicine

## 2023-10-22 VITALS — BP 138/84 | HR 80 | Ht 67.0 in | Wt 241.0 lb

## 2023-10-22 DIAGNOSIS — Z9889 Other specified postprocedural states: Secondary | ICD-10-CM

## 2023-10-22 DIAGNOSIS — M25561 Pain in right knee: Secondary | ICD-10-CM | POA: Diagnosis not present

## 2023-10-22 DIAGNOSIS — L299 Pruritus, unspecified: Secondary | ICD-10-CM

## 2023-10-22 DIAGNOSIS — L209 Atopic dermatitis, unspecified: Secondary | ICD-10-CM | POA: Diagnosis not present

## 2023-10-22 DIAGNOSIS — M25461 Effusion, right knee: Secondary | ICD-10-CM

## 2023-10-22 DIAGNOSIS — Z114 Encounter for screening for human immunodeficiency virus [HIV]: Secondary | ICD-10-CM

## 2023-10-22 MED ORDER — HYDROXYZINE PAMOATE 25 MG PO CAPS: 25.0000 mg | ORAL_CAPSULE | Freq: Three times a day (TID) | ORAL | 0 refills | Status: DC | PRN

## 2023-10-22 MED ORDER — TRIAMCINOLONE ACETONIDE 0.5 % EX CREA: 1.0000 | TOPICAL_CREAM | Freq: Two times a day (BID) | CUTANEOUS | 0 refills | Status: DC

## 2023-10-22 NOTE — Progress Notes (Signed)
Subjective:    Patient ID: Jerrye Bushy, female    DOB: 29-Aug-1971, 52 y.o.   MRN: [id number]  Amanda Webb is a 52 y.o. female presenting on 10/22/2023 for Rash (On the 28th, was in tall grass, on the 29th rash developed all over, non painful, sometimes mild itching)   HPI  Discussed the use of AI scribe software for clinical note transcription with the patient, who gave verbal consent to proceed.      Generalized Rash / Pruritic Rash Reports onset 1 week ago, with various generalized rash mostly itchy and she has been scratching. No obvious raised rash or spots. No ulceration or blisters. Increased stressors and car broke down and new job started that causes her stress. Also she walked in tall grass and says thinks rash started later. She had antibiotic recently Levaquin does not normally bother her no other medication that seems like could have provoked it. Taking Zyrtec daily Denies fever or chills or spreading redness nausea vomiting   Right Knee Pain / Swelling Previously had R knee meniscus surgery through Arthroscopy 07/11/21 Dr Signa [facility]) Now some increased swelling on RLE and pain on inner aspect of knee with walking, and this is the same area as before. No new injury or acute problem. Seems gradual worsening recently. Admits lower extremity swelling at knee and below RLE Requesting Referral to [facility] Dr Allena Katz, to return for evaluation  The patient also requests an HIV test, as she was recently tested for other STDs but not HIV. Request a new screening lab test  Past Surgical History:  Procedure Laterality Date   CARPAL TUNNEL RELEASE Bilateral 2011   FOOT SURGERY Right 2013   Plantar fascia   KNEE ARTHROSCOPY WITH MEDIAL MENISECTOMY Right 07/11/2021   Procedure: Right knee arthroscopic partial medial meniscectomy;  Surgeon: Signa Kell, MD;  Location: [facility];  Service: Orthopedics;  Laterality: Right;   ROBOTIC ASSISTED LAPAROSCOPIC  CHOLECYSTECTOMY  07/15/2020   Dr Claudine Mouton   TONSILLECTOMY  1979   with ear tubes       10/22/2023    8:22 AM 09/03/2023   10:54 AM 11/19/2022   10:50 AM  Depression screen PHQ 2/9  Decreased Interest 2 2 3   Down, Depressed, Hopeless 1 1 3   PHQ - 2 Score 3 3 6   Altered sleeping 0 0 3  Tired, decreased energy 1 2 3   Change in appetite 0 0 3  Feeling bad or failure about yourself  2 1 3   Trouble concentrating 0 0 2  Moving slowly or fidgety/restless 0 0 0  Suicidal thoughts 0 0 0  PHQ-9 Score 6 6 20   Difficult doing work/chores Somewhat difficult Very difficult Not difficult at all    Social History   Tobacco Use   Smoking status: Every Day    Current packs/day: 1.00    Average packs/day: 1 pack/day for 32.0 years (32.0 ttl pk-yrs)    Types: Cigarettes    Passive exposure: Current   Smokeless tobacco: Never   Tobacco comments:    1 ppd/ 11/22/2020  Vaping Use   Vaping status: Never Used  Substance Use Topics   Alcohol use: Yes    Alcohol/week: 3.0 standard drinks of alcohol    Types: 3 Glasses of wine per week   Drug use: No    Review of Systems Per HPI unless specifically indicated above     Objective:    BP (!) 144/88   Pulse 80  Ht 5\' 7"  (1.702 m)   Wt 241 lb (109.3 kg)   SpO2 100%   BMI 37.75 kg/m   Wt Readings from Last 3 Encounters:  10/22/23 241 lb (109.3 kg)  10/21/23 240 lb (108.9 kg)  09/03/23 236 lb 12.8 oz (107.4 kg)    Physical Exam Vitals and nursing note reviewed.  Constitutional:      General: She is not in acute distress.    Appearance: Normal appearance. She is well-developed. She is not diaphoretic.     Comments: Well-appearing, comfortable, cooperative  HENT:     Head: Normocephalic and atraumatic.  Eyes:     General:        Right eye: No discharge.        Left eye: No discharge.     Conjunctiva/sclera: Conjunctivae normal.  Cardiovascular:     Rate and Rhythm: Normal rate.  Pulmonary:     Effort: Pulmonary effort is  normal.  Musculoskeletal:     Right lower leg: Edema (R knee and lower extremity mild) present.     Comments: Localized tenderness R medial knee Has intact range of motion  Skin:    General: Skin is warm and dry.     Findings: Rash (very faint splotchy macular rash scattered generalized. no focal raised lesions. no particular pattern or cluster. no erythema spreading.) present. No erythema.  Neurological:     Mental Status: She is alert and oriented to person, place, and time.  Psychiatric:        Mood and Affect: Mood normal.        Behavior: Behavior normal.        Thought Content: Thought content normal.     Comments: Well groomed, good eye contact, normal speech and thoughts       Results for orders placed or performed in visit on 09/03/23  Urine Culture   Specimen: Urine  Result Value Ref Range   MICRO NUMBER: [id number]    SPECIMEN QUALITY: Adequate    Sample Source URINE, CLEAN CATCH    STATUS: FINAL    Result:      Mixed genital flora isolated. These superficial bacteria are not indicative of a urinary tract infection. No further organism identification is warranted on this specimen. If clinically indicated, recollect clean-catch, mid-stream urine and transfer  immediately to Urine Culture Transport Tube.   POCT glycosylated hemoglobin (Hb A1C)  Result Value Ref Range   Hemoglobin A1C 6.8 (A) 4.0 - 5.6 %   HbA1c POC (<> result, manual entry)     HbA1c, POC (prediabetic range)     HbA1c, POC (controlled diabetic range)    POCT urinalysis dipstick  Result Value Ref Range   Color, UA     Clarity, UA     Glucose, UA Negative Negative   Bilirubin, UA Negative    Ketones, UA Negative    Spec Grav, UA <=1.005 (A) 1.010 - 1.025   Blood, UA Trace    pH, UA 6.0 5.0 - 8.0   Protein, UA Negative Negative   Urobilinogen, UA 0.2 0.2 or 1.0 E.U./dL   Nitrite, UA Negative    Leukocytes, UA Small (1+) (A) Negative   Appearance     Odor        Assessment & Plan:    Problem List Items Addressed This Visit   None Visit Diagnoses     Atopic dermatitis, unspecified type    -  Primary   Relevant Medications   hydrOXYzine (VISTARIL) 25 MG  capsule   triamcinolone cream (KENALOG) 0.5 %   Generalized pruritus       Relevant Medications   hydrOXYzine (VISTARIL) 25 MG capsule   Screening for HIV (human immunodeficiency virus)       Relevant Orders   HIV Antibody (routine testing w rflx)         Generalized Rash / pruritic / Atopic dermatitis Onset one week ago, itchy, patchy spots all over the body. Likely allergic or hive-like in nature. No signs of infection or severe drug reaction. -Continue Zyrtec daily for antihistamine effect. -Start Hydroxyzine as needed for severe itching episodes, cautioning sedation. -Apply prescribed topical Triamcinolone cream for spot treatment. -Consider Prednisone as a last resort if rash does not improve. (She already has rx at home)  Right Knee Pain swelling / medial Hs/p R Knee medial meniscus arthroscopic repair Increased swelling and pain on the inner aspect of the knee with walking. History of right knee meniscus surgery through arthroscope in July 2022. -Referral to Dr. Adrian Prows at [facility] for further evaluation and management.  HIV Screening Order HIV screening blood test. Requested today  Elevated Blood Pressure Slightly elevated blood pressure noted during visit, possibly due to stress. -Monitor blood pressure at home and report if consistently elevated.         Orders Placed This Encounter  Procedures   HIV Antibody (routine testing w rflx)   Ambulatory referral to Orthopedic Surgery    Referral Priority:   Routine    Referral Type:   Surgical    Referral Reason:   Specialty Services Required    Requested Specialty:   Orthopedic Surgery    Number of Visits Requested:   1     Meds ordered this encounter  Medications   hydrOXYzine (VISTARIL) 25 MG capsule    Sig: Take 1  capsule (25 mg total) by mouth every 8 (eight) hours as needed.    Dispense:  30 capsule    Refill:  0   triamcinolone cream (KENALOG) 0.5 %    Sig: Apply 1 Application topically 2 (two) times daily. To affected areas, for up to 2 weeks.    Dispense:  30 g    Refill:  0     Follow up plan: Return if symptoms worsen or fail to improve.  Saralyn Pilar, DO [facility] 10/22/2023, 8:35 AM

## 2023-10-22 NOTE — Patient Instructions (Addendum)
Thank you for coming to the office today.  Start Hydroxyzine as needed for more severe itching episodes, caution sedation  Use Topical Cream for spot treatment for tching / rash  Keep on Zyrtec every day  Most likely allergic rash vs hives  If severe or not improving as you would like, can take the Prednisone.  --------  Referral back to Dr Signa Kell at [facility] for the Right Knee.  Lab screening for HIV today   Please schedule a Follow-up Appointment to: Return if symptoms worsen or fail to improve.  If you have any other questions or concerns, please feel free to call the office or send a message through MyChart. You may also schedule an earlier appointment if necessary.  Additionally, you may be receiving a survey about your experience at our office within a few days to 1 week by e-mail or mail. We value your feedback.  Saralyn Pilar, DO [facility], [place]

## 2023-10-23 LAB — HIV ANTIBODY (ROUTINE TESTING W REFLEX): HIV 1&2 Ab, 4th Generation: NONREACTIVE

## 2023-10-28 ENCOUNTER — Inpatient Hospital Stay: Payer: Medicare Other | Attending: Oncology

## 2023-10-28 ENCOUNTER — Encounter: Payer: Self-pay | Admitting: Family Medicine

## 2023-10-28 DIAGNOSIS — D509 Iron deficiency anemia, unspecified: Secondary | ICD-10-CM

## 2023-10-28 DIAGNOSIS — D519 Vitamin B12 deficiency anemia, unspecified: Secondary | ICD-10-CM | POA: Insufficient documentation

## 2023-10-28 DIAGNOSIS — M25561 Pain in right knee: Secondary | ICD-10-CM | POA: Diagnosis not present

## 2023-10-28 DIAGNOSIS — B356 Tinea cruris: Secondary | ICD-10-CM

## 2023-10-28 MED ORDER — CYANOCOBALAMIN 1000 MCG/ML IJ SOLN
1000.0000 ug | INTRAMUSCULAR | Status: DC
Start: 2023-10-28 — End: 2023-10-28
  Administered 2023-10-28: 1000 ug via INTRAMUSCULAR
  Filled 2023-10-28 (×2): qty 1

## 2023-10-29 MED ORDER — FLUCONAZOLE 150 MG PO TABS: 150.0000 mg | ORAL_TABLET | ORAL | 0 refills | Status: DC

## 2023-10-29 NOTE — Addendum Note (Signed)
Addended by: Smitty Cords on: 10/29/2023 05:11 PM   Modules accepted: Orders

## 2023-11-08 DIAGNOSIS — J208 Acute bronchitis due to other specified organisms: Secondary | ICD-10-CM | POA: Diagnosis not present

## 2023-11-08 DIAGNOSIS — B9689 Other specified bacterial agents as the cause of diseases classified elsewhere: Secondary | ICD-10-CM | POA: Diagnosis not present

## 2023-11-08 DIAGNOSIS — R059 Cough, unspecified: Secondary | ICD-10-CM | POA: Diagnosis not present

## 2023-11-08 DIAGNOSIS — R0981 Nasal congestion: Secondary | ICD-10-CM | POA: Diagnosis not present

## 2023-11-12 NOTE — Telephone Encounter (Signed)
Care team updated and letter sent for eye exam notes.

## 2023-11-26 ENCOUNTER — Telehealth: Payer: Medicare Other | Admitting: Family Medicine

## 2023-11-26 ENCOUNTER — Encounter: Payer: Self-pay | Admitting: Family Medicine

## 2023-11-26 DIAGNOSIS — J410 Simple chronic bronchitis: Secondary | ICD-10-CM

## 2023-11-26 DIAGNOSIS — J440 Chronic obstructive pulmonary disease with acute lower respiratory infection: Secondary | ICD-10-CM | POA: Diagnosis not present

## 2023-11-26 DIAGNOSIS — J209 Acute bronchitis, unspecified: Secondary | ICD-10-CM

## 2023-11-26 MED ORDER — DOXYCYCLINE HYCLATE 100 MG PO TABS: 100.0000 mg | ORAL_TABLET | Freq: Two times a day (BID) | ORAL | 0 refills | Status: DC

## 2023-11-26 MED ORDER — PREDNISONE 20 MG PO TABS: ORAL_TABLET | ORAL | 0 refills | Status: DC

## 2023-11-26 NOTE — Progress Notes (Signed)
Subjective:    Patient ID: Amanda Webb, female    DOB: 05/21/1971, 52 y.o.   MRN: [id number]  Amanda Webb is a 52 y.o. female presenting on 11/26/2023 for COPD   Virtual / Telehealth Encounter - Video Visit via MyChart The purpose of this virtual visit is to provide medical care while limiting exposure to the novel coronavirus (COVID19) for both patient and office staff.  Consent was obtained for remote visit:  Yes.   Answered questions that patient had about telehealth interaction:  Yes.   I discussed the limitations, risks, security and privacy concerns of performing an evaluation and management service by video/telephone. I also discussed with the patient that there may be a patient responsible charge related to this service. The patient expressed understanding and agreed to proceed.  Patient Location: Home Provider Location: [facility] (Office)  Participants in virtual visit: - Patient: Amanda Webb - CMA: Fuller Plan CMA - Provider: Dr Althea Charon   HPI  Discussed the use of AI scribe software for clinical note transcription with the patient, who gave verbal consent to proceed.  COPD Bronchitis Recent history Treated for Pneumonia bronchitis on 10/06/23 > Zpak > Levaquin antibiotic, Prednisone Improved but did not resolve. She has sick contacts with family and mother who had pneumonia and long covid.  Then went to urgent care, Repeat treatment on 11/08/23 - with Chest X-ray and Fever. She was given rx Augmentin. Did not resolve  She has COPD and uses Trelegy daily. She uses Albuterol AS NEEDED. Some relief. She no longer follows w/ Pulmonology [place], last seen 2022      10/22/2023    8:22 AM 09/03/2023   10:54 AM 11/19/2022   10:50 AM  Depression screen PHQ 2/9  Decreased Interest 2 2 3   Down, Depressed, Hopeless 1 1 3   PHQ - 2 Score 3 3 6   Altered sleeping 0 0 3  Tired, decreased energy 1 2 3   Change in appetite 0 0 3  Feeling  bad or failure about yourself  2 1 3   Trouble concentrating 0 0 2  Moving slowly or fidgety/restless 0 0 0  Suicidal thoughts 0 0 0  PHQ-9 Score 6 6 20   Difficult doing work/chores Somewhat difficult Very difficult Not difficult at all       10/22/2023    8:22 AM 09/03/2023   10:54 AM 11/19/2022   10:50 AM 10/19/2022    3:27 PM  GAD 7 : Generalized Anxiety Score  Nervous, Anxious, on Edge 2 2 3 3   Control/stop worrying 2 2 3 3   Worry too much - different things 2 2 3 3   Trouble relaxing 2 2 3 3   Restless 0 1 3 0  Easily annoyed or irritable 3 3 3 3   Afraid - awful might happen 3 2 3 3   Total GAD 7 Score 14 14 21 18   Anxiety Difficulty   Somewhat difficult Somewhat difficult    Social History   Tobacco Use   Smoking status: Every Day    Current packs/day: 1.00    Average packs/day: 1 pack/day for 32.0 years (32.0 ttl pk-yrs)    Types: Cigarettes    Passive exposure: Current   Smokeless tobacco: Never   Tobacco comments:    1 ppd/ 11/22/2020  Vaping Use   Vaping status: Never Used  Substance Use Topics   Alcohol use: Yes    Alcohol/week: 3.0 standard drinks of alcohol    Types: 3 Glasses  of wine per week   Drug use: No    Review of Systems Per HPI unless specifically indicated above     Objective:    There were no vitals taken for this visit.  Wt Readings from Last 3 Encounters:  10/22/23 241 lb (109.3 kg)  10/21/23 240 lb (108.9 kg)  09/03/23 236 lb 12.8 oz (107.4 kg)     Physical Exam  Note examination was completely remotely via video observation objective data only  Gen - well-appearing, no acute distress or apparent pain, comfortable HEENT - eyes appear clear without discharge or redness Heart/Lungs - cannot examine virtually - observed frequent coughing Abd - cannot examine virtually  Skin - face visible today- no rash Neuro - awake, alert, oriented Psych - not anxious appearing  I have personally reviewed the radiology report from 11/08/23  CXR.  Imaging Results - XR chest 2 views (11/08/2023 10:29 AM EST) Procedure Note  Reading, Radiology - 11/08/2023  Formatting of this note might be different from the original. TECHNIQUE: 1 frontal and 1 lateral view of the chest  COMPARISON: 01/30/2021 reviewed report (images not available)  FINDINGS:   Trachea: Not abnormally deviated.  Mediastinal silhouette: Within normal limits for positioning and technique.  Cardiac silhouette: Within normal limits for positioning and technique.  Lungs: No pneumothorax. No pleural effusion. Patchy infiltrates bilaterally.  Osseous structures: No acute skeletal abnormality. Thoracic spondylosis.  IMPRESSION:  1. Patchy infiltrates bilaterally.  Electronically signed by: Daryel Gerald. Serita Kyle, M.D. on 11/08/2023 10:29:52      Results for orders placed or performed in visit on 10/22/23  HIV Antibody (routine testing w rflx)  Result Value Ref Range   HIV 1&2 Ab, 4th Generation NON-REACTIVE NON-REACTIVE      Assessment & Plan:   Problem List Items Addressed This Visit     Simple chronic bronchitis (HCC) - Primary   Relevant Medications   doxycycline (VIBRA-TABS) 100 MG tablet   predniSONE (DELTASONE) 20 MG tablet   Other Visit Diagnoses     Acute bronchitis with COPD (HCC)       Relevant Medications   doxycycline (VIBRA-TABS) 100 MG tablet   predniSONE (DELTASONE) 20 MG tablet      Acute COPD Exacerbation vs CAP / Pneumonia Recurrent lower respiratory infection over past 4-6 weeks Prior antibiotic courses Levaquin, Azithromycin, Augmentin X-ray 11/08/23 reviewed  Continue COPD therapy - Trelegy, Albuterol  Trial Doxycycline, precautions given, allergy to Tetracyline with nausea, she will try this and has taken Doxy before.  Repeat Prednisone oral course taper   Return to Pulm. She will call to schedule w/ Pulmonology Dr Jayme Cloud office, [place] [facility] [place]  If need referral, please let me know on message and  I can send referral.   No orders of the defined types were placed in this encounter.   Meds ordered this encounter  Medications   doxycycline (VIBRA-TABS) 100 MG tablet    Sig: Take 1 tablet (100 mg total) by mouth 2 (two) times daily. For 10 days. Take with full glass of water, stay upright 30 min after taking.    Dispense:  20 tablet    Refill:  0   predniSONE (DELTASONE) 20 MG tablet    Sig: Take daily with food. Start with 60mg  (3 pills) x 2 days, then reduce to 40mg  (2 pills) x 2 days, then 20mg  (1 pill) x 3 days    Dispense:  13 tablet    Refill:  0    Follow up plan:  Return if symptoms worsen or fail to improve.   Patient verbalizes understanding with the above medical recommendations including the limitation of remote medical advice.  Specific follow-up and call-back criteria were given for patient to follow-up or seek medical care more urgently if needed.  Total duration of direct patient care provided via video conference: 10 minutes   Saralyn Pilar, DO [facility] 11/26/2023, 11:29 AM

## 2023-11-26 NOTE — Patient Instructions (Addendum)
Please call Dr Jayme Cloud office to schedule follow-up for COPD / Chronic Bronchitis.  If they cannot schedule you and you need a new referral, please message me or call us and I can send a referral  [facility] at [facility] [address] [place],  [place]  [address] Main: [phone]  Start taking Doxycycline antibiotic 100mg  twice daily for 10 days. Take with full glass of water and stay upright for at least 30 min after taking, may be seated or standing, but should NOT lay down. This is just a safety precaution, if this medicine does not go all the way down throat well it could cause some burning discomfort to throat and esophagus.  Start Prednisone 7 day taper  Use OTC meds  Use Albuterol AS NEEDED Use Trelegy daily   Please schedule a Follow-up Appointment to: No follow-ups on file.  If you have any other questions or concerns, please feel free to call the office or send a message through MyChart. You may also schedule an earlier appointment if necessary.  Additionally, you may be receiving a survey about your experience at our office within a few days to 1 week by e-mail or mail. We value your feedback.  Saralyn Pilar, DO [facility], [place]

## 2023-11-27 ENCOUNTER — Inpatient Hospital Stay: Payer: Medicare Other

## 2023-11-28 ENCOUNTER — Inpatient Hospital Stay: Payer: Medicare Other | Attending: Oncology

## 2023-11-28 DIAGNOSIS — D519 Vitamin B12 deficiency anemia, unspecified: Secondary | ICD-10-CM | POA: Insufficient documentation

## 2023-12-04 ENCOUNTER — Other Ambulatory Visit: Payer: Self-pay | Admitting: Family Medicine

## 2023-12-04 ENCOUNTER — Encounter: Payer: Self-pay | Admitting: Family Medicine

## 2023-12-04 ENCOUNTER — Other Ambulatory Visit: Payer: Self-pay

## 2023-12-04 DIAGNOSIS — E039 Hypothyroidism, unspecified: Secondary | ICD-10-CM

## 2023-12-04 DIAGNOSIS — J0100 Acute maxillary sinusitis, unspecified: Secondary | ICD-10-CM

## 2023-12-04 MED ORDER — LEVOTHYROXINE SODIUM 50 MCG PO TABS: 50.0000 ug | ORAL_TABLET | Freq: Every day | ORAL | 1 refills | Status: DC

## 2023-12-04 MED ORDER — AZELASTINE HCL 0.1 % NA SOLN: 1.0000 | Freq: Two times a day (BID) | NASAL | 5 refills | Status: DC

## 2023-12-04 NOTE — Telephone Encounter (Signed)
Duplicate request- already ordered 12/04/23 30ml 5RF Requested Prescriptions  Pending Prescriptions Disp Refills   azelastine (ASTELIN) 0.1 % nasal spray [Pharmacy Med Name: AZELASTINE 0.1%(137MCG) NASAL-200SP] 30 mL 5    Sig: USE 1 SPRAY IN EACH NOSTRIL TWICE DAILY AS DIRECTED     Ear, Nose, and Throat: Nasal Preparations - Antiallergy Passed - 12/04/2023  1:58 PM      Passed - Valid encounter within last 12 months    Recent Outpatient Visits           1 week ago Simple chronic bronchitis [facility])   [place] [facility] Smitty Cords, DO   1 month ago Atopic dermatitis, unspecified type   [place] [facility] [place], Netta Neat, DO   3 months ago Type 2 diabetes mellitus with other specified complication, without long-term current use of insulin [facility])   [place] [facility] Smitty Cords, DO   10 months ago Recurrent UTI   [facility] Smitty Cords, DO   11 months ago Type 2 diabetes mellitus with other specified complication, without long-term current use of insulin [facility])   [place] [facility], Netta Neat, [place]

## 2023-12-05 ENCOUNTER — Inpatient Hospital Stay: Payer: Medicare Other

## 2023-12-05 DIAGNOSIS — F4310 Post-traumatic stress disorder, unspecified: Secondary | ICD-10-CM | POA: Diagnosis not present

## 2023-12-05 DIAGNOSIS — D519 Vitamin B12 deficiency anemia, unspecified: Secondary | ICD-10-CM | POA: Diagnosis not present

## 2023-12-05 DIAGNOSIS — D509 Iron deficiency anemia, unspecified: Secondary | ICD-10-CM

## 2023-12-05 MED ORDER — CYANOCOBALAMIN 1000 MCG/ML IJ SOLN
1000.0000 ug | INTRAMUSCULAR | Status: DC
  Administered 2023-12-05: 1000 ug via INTRAMUSCULAR
  Filled 2023-12-05: qty 1

## 2023-12-06 ENCOUNTER — Other Ambulatory Visit: Payer: Self-pay | Admitting: Family Medicine

## 2023-12-06 DIAGNOSIS — L299 Pruritus, unspecified: Secondary | ICD-10-CM

## 2023-12-06 DIAGNOSIS — L209 Atopic dermatitis, unspecified: Secondary | ICD-10-CM

## 2023-12-09 NOTE — Telephone Encounter (Signed)
Requested Prescriptions  Pending Prescriptions Disp Refills   hydrOXYzine (VISTARIL) 25 MG capsule [Pharmacy Med Name: HYDROXYZINE PAMOATE 25MG  CAPSULES] 30 capsule 0    Sig: TAKE 1 CAPSULE(25 MG) BY MOUTH EVERY 8 HOURS AS NEEDED     Ear, Nose, and Throat:  Antihistamines 2 Passed - 12/09/2023 11:50 AM      Passed - Cr in normal range and within 360 days    Creat  Date Value Ref Range Status  11/08/2020 0.64 0.50 - 1.10 mg/dL Final   Creatinine, Ser  Date Value Ref Range Status  02/27/2023 0.62 0.57 - 1.00 mg/dL Final   Creatinine, Urine  Date Value Ref Range Status  10/19/2022 24 20 - 275 mg/dL Final         Passed - Valid encounter within last 12 months    Recent Outpatient Visits           1 week ago Simple chronic bronchitis [facility])   [place] [facility] Smitty Cords, DO   1 month ago Atopic dermatitis, unspecified type   [place] [facility] [place], Netta Neat, DO   3 months ago Type 2 diabetes mellitus with other specified complication, without long-term current use of insulin [facility])   [place] [facility] Smitty Cords, DO   10 months ago Recurrent UTI   [facility] Smitty Cords, DO   11 months ago Type 2 diabetes mellitus with other specified complication, without long-term current use of insulin [facility])   [place] [facility] [place], Netta Neat, [place]

## 2023-12-13 ENCOUNTER — Other Ambulatory Visit: Payer: Self-pay

## 2023-12-13 ENCOUNTER — Other Ambulatory Visit: Payer: Self-pay | Admitting: Internal Medicine

## 2023-12-13 ENCOUNTER — Other Ambulatory Visit: Payer: Self-pay | Admitting: Family Medicine

## 2023-12-13 DIAGNOSIS — I10 Essential (primary) hypertension: Secondary | ICD-10-CM

## 2023-12-13 MED ORDER — ATENOLOL 50 MG PO TABS: ORAL_TABLET | ORAL | 0 refills | Status: DC

## 2023-12-16 ENCOUNTER — Encounter: Payer: Self-pay | Admitting: Family Medicine

## 2023-12-17 NOTE — Telephone Encounter (Signed)
 Requested Prescriptions  Pending Prescriptions Disp Refills   atenolol  (TENORMIN ) 50 MG tablet [Pharmacy Med Name: ATENOLOL  50MG  TABLETS] 90 tablet 0    Sig: TAKE 1 TABLET(50 MG) BY MOUTH DAILY     Cardiovascular: Beta Blockers 2 Passed - 12/17/2023  4:40 PM      Passed - Cr in normal range and within 360 days    Creat  Date Value Ref Range Status  11/08/2020 0.64 0.50 - 1.10 mg/dL Final   Creatinine, Ser  Date Value Ref Range Status  02/27/2023 0.62 0.57 - 1.00 mg/dL Final   Creatinine, Urine  Date Value Ref Range Status  10/19/2022 24 20 - 275 mg/dL Final         Passed - Last BP in normal range    BP Readings from Last 1 Encounters:  10/22/23 138/84         Passed - Last Heart Rate in normal range    Pulse Readings from Last 1 Encounters:  10/22/23 80         Passed - Valid encounter within last 6 months    Recent Outpatient Visits           3 weeks ago Simple chronic bronchitis [facility])   [place] [facility] [place], Marsa PARAS, DO   1 month ago Atopic dermatitis, unspecified type   [place] [facility] [place], Marsa PARAS, DO   3 months ago Type 2 diabetes mellitus with other specified complication, without long-term current use of insulin [facility])   [place] [facility] [place] Marsa PARAS, DO   10 months ago Recurrent UTI   [facility] [place] Marsa PARAS, DO   11 months ago Type 2 diabetes mellitus with other specified complication, without long-term current use of insulin [facility])   [place] [facility] [place], Marsa PARAS, [place]

## 2023-12-26 ENCOUNTER — Other Ambulatory Visit: Payer: Self-pay | Admitting: Family Medicine

## 2023-12-26 DIAGNOSIS — K219 Gastro-esophageal reflux disease without esophagitis: Secondary | ICD-10-CM

## 2023-12-27 ENCOUNTER — Inpatient Hospital Stay: Payer: Medicare Other

## 2023-12-27 ENCOUNTER — Inpatient Hospital Stay: Payer: Medicare Other | Attending: Oncology

## 2023-12-27 DIAGNOSIS — D519 Vitamin B12 deficiency anemia, unspecified: Secondary | ICD-10-CM | POA: Insufficient documentation

## 2023-12-30 ENCOUNTER — Inpatient Hospital Stay: Payer: Medicare Other

## 2023-12-30 ENCOUNTER — Telehealth: Payer: Medicare Other | Admitting: Family Medicine

## 2023-12-30 DIAGNOSIS — D519 Vitamin B12 deficiency anemia, unspecified: Secondary | ICD-10-CM | POA: Diagnosis not present

## 2023-12-30 DIAGNOSIS — D509 Iron deficiency anemia, unspecified: Secondary | ICD-10-CM

## 2023-12-30 MED ORDER — CYANOCOBALAMIN 1000 MCG/ML IJ SOLN
1000.0000 ug | INTRAMUSCULAR | Status: DC
  Administered 2023-12-30: 1000 ug via INTRAMUSCULAR
  Filled 2023-12-30: qty 1

## 2023-12-30 NOTE — Telephone Encounter (Signed)
 Requested Prescriptions  Pending Prescriptions Disp Refills   pantoprazole  (PROTONIX ) 20 MG tablet [Pharmacy Med Name: PANTOPRAZOLE  20MG  TABLETS] 90 tablet 1    Sig: TAKE 1 TABLET(20 MG) BY MOUTH DAILY     Gastroenterology: Proton Pump Inhibitors Passed - 12/30/2023 10:48 AM      Passed - Valid encounter within last 12 months    Recent Outpatient Visits           1 month ago Simple chronic bronchitis [facility])   [place] [facility] [place], Marsa PARAS, DO   2 months ago Atopic dermatitis, unspecified type   [place] [facility] [place], Marsa PARAS, DO   3 months ago Type 2 diabetes mellitus with other specified complication, without long-term current use of insulin [facility])   [place] [facility] [place] Marsa PARAS, DO   11 months ago Recurrent UTI   [facility] [place] Marsa PARAS, DO   12 months ago Type 2 diabetes mellitus with other specified complication, without long-term current use of insulin [facility])   [place] [facility] [place], Marsa PARAS, DO       Future Appointments             Today [place], Marsa PARAS, DO [place], [facility]

## 2023-12-31 ENCOUNTER — Other Ambulatory Visit: Payer: Self-pay | Admitting: Family Medicine

## 2023-12-31 DIAGNOSIS — J410 Simple chronic bronchitis: Secondary | ICD-10-CM

## 2024-01-01 NOTE — Telephone Encounter (Signed)
 Requested medication (s) are due for refill today: Yes  Requested medication (s) are on the active medication list: Yes  Last refill:  03/08/23  Future visit scheduled: No  Notes to clinic:  Unable to refill per protocol, medication not assigned to the refill protocol.      Requested Prescriptions  Pending Prescriptions Disp Refills   TRELEGY ELLIPTA  100-62.5-25 MCG/ACT AEPB [Pharmacy Med Name: TRELEGY ELLIPTA  100-62. INH 30P] 180 each 1    Sig: INHALE 1 PUFF INTO THE LUNGS DAILY     Off-Protocol Failed - 01/01/2024  2:28 PM      Failed - Medication not assigned to a protocol, review manually.      Passed - Valid encounter within last 12 months    Recent Outpatient Visits           1 month ago Simple chronic bronchitis [facility])   [place] [facility] [place], Kayleen Party, DO   2 months ago Atopic dermatitis, unspecified type   [place] [facility] [place], Kayleen Party, DO   4 months ago Type 2 diabetes mellitus with other specified complication, without long-term current use of insulin [facility])   [place] [facility] Raina Bunting, DO   11 months ago Recurrent UTI   [facility] Raina Bunting, DO   1 year ago Type 2 diabetes mellitus with other specified complication, without long-term current use of insulin [facility])   [place] [facility] [place], Kayleen Party, [place]

## 2024-01-02 ENCOUNTER — Other Ambulatory Visit: Payer: Self-pay | Admitting: Family Medicine

## 2024-01-02 DIAGNOSIS — I10 Essential (primary) hypertension: Secondary | ICD-10-CM

## 2024-01-04 ENCOUNTER — Telehealth: Payer: Medicare Other | Admitting: Physician Assistant

## 2024-01-04 DIAGNOSIS — J0110 Acute frontal sinusitis, unspecified: Secondary | ICD-10-CM | POA: Diagnosis not present

## 2024-01-04 MED ORDER — AMOXICILLIN-POT CLAVULANATE 875-125 MG PO TABS: 1.0000 | ORAL_TABLET | Freq: Two times a day (BID) | ORAL | 0 refills | Status: AC

## 2024-01-04 NOTE — Progress Notes (Signed)
Virtual Visit Consent   Amanda Webb, you are scheduled for a virtual visit with a [facility] provider today. Just as with appointments in the office, your consent must be obtained to participate. Your consent will be active for this visit and any virtual visit you may have with one of our providers in the next 365 days. If you have a MyChart account, a copy of this consent can be sent to you electronically.  As this is a virtual visit, video technology does not allow for your provider to perform a traditional examination. This may limit your provider's ability to fully assess your condition. If your provider identifies any concerns that need to be evaluated in person or the need to arrange testing (such as labs, EKG, etc.), we will make arrangements to do so. Although advances in technology are sophisticated, we cannot ensure that it will always work on either your end or our end. If the connection with a video visit is poor, the visit may have to be switched to a telephone visit. With either a video or telephone visit, we are not always able to ensure that we have a secure connection.  By engaging in this virtual visit, you consent to the provision of healthcare and authorize for your insurance to be billed (if applicable) for the services provided during this visit. Depending on your insurance coverage, you may receive a charge related to this service.  I need to obtain your verbal consent now. Are you willing to proceed with your visit today? Amanda Webb has provided verbal consent on 01/04/2024 for a virtual visit (video or telephone). Amanda Jaffe, PA-C  Date: 01/04/2024 4:42 PM  Virtual Visit via Video Note   I, Amanda Webb, connected with  Amanda Webb  ([id number], 1971/12/10) on 01/04/24 at  4:30 PM EST by a video-enabled telemedicine application and verified that I am speaking with the correct person using two identifiers.  Location: Patient: Virtual Visit Location Patient:  Home Provider: Virtual Visit Location Provider: Home Office   I discussed the limitations of evaluation and management by telemedicine and the availability of in person appointments. The patient expressed understanding and agreed to proceed.      History of Present Illness: Amanda Webb is a 53 y.o. who identifies as a female who was assigned female at birth, and presents with a three-day history of right-sided facial swelling and headache. She reports bloody nasal discharge when blowing the nose. Accompanying symptoms include fatigue and an upset stomach. She denies any ear pain but describes a sensation of fullness in the sinuses.  She has been in contact with her mother, who recently had a sinus infection. To alleviate symptoms, she has tried Mucinex, Sudafed, ibuprofen, Flonase,, but these have not provided relief and have caused a burning sensation when used.    Problems:  Patient Active Problem List   Diagnosis Date Noted   Major depressive disorder, recurrent, in partial remission (HCC) 11/08/2020   Cough 10/21/2020   Status post laparoscopic cholecystectomy 07/28/2020   Morbid obesity (HCC) 07/05/2020   Hepatic steatosis 06/23/2020   Lymphedema 03/09/2020   Chronic venous insufficiency 03/09/2020   Bilateral lower extremity edema 02/16/2020   Female cystocele 10/21/2018   Urinary incontinence, mixed 10/21/2018   B12 deficiency 04/08/2018   Iron deficiency anemia 03/14/2018   GAD (generalized anxiety disorder) 03/10/2018   Acute right-sided low back pain 10/28/2017   Tobacco abuse 05/09/2017   Multiple thyroid nodules 12/28/2016  Simple chronic bronchitis (HCC) 12/13/2016   BMI 40.0-44.9, adult (HCC) 09/26/2016   HLD (hyperlipidemia) 09/26/2016   Vitamin D deficiency 09/26/2016   Type 2 diabetes mellitus with other specified complication (HCC) 06/17/2015   Candidiasis of skin and nail 02/21/2015   Candidal intertrigo 02/21/2015   Cellulitis of right lower extremity  02/18/2015   Benign cyst of right kidney 01/17/2015   Chronic sinusitis 11/15/2014   Whiplash injury 09/03/2014   Heart palpitations 04/02/2014   Palpitations 04/02/2014   Allergic rhinitis 12/31/2013   Spondylosis of lumbar region without myelopathy or radiculopathy 12/28/2013   Chronic low back pain 09/28/2013   Other fatigue 09/28/2013   Hypothyroidism 09/28/2013   Alteration of body temperature 09/28/2013   Other fatigue 09/28/2013   Plantar fasciitis, bilateral 07/10/2013   Tarsal tunnel syndrome of right side 07/10/2013   Plantar fascial fibromatosis 07/10/2013   Encounter for surveillance of injectable contraceptive 05/13/2013   Diuresis excessive 03/24/2013   Polyuria 03/24/2013   Abnormal uterine and vaginal bleeding, unspecified 03/24/2013   Arthritis, multiple joint involvement 03/17/2013   Fibroblastic disorder 07/28/2012   Borderline personality disorder (HCC) 12/17/2010   Borderline personality disorder in adult [facility]) 12/17/2010   Essential hypertension 02/18/2010   Carpal tunnel syndrome on both sides 12/17/2006   Major depressive disorder, single episode 02/21/1998   Major depressive disorder, single episode 02/21/1998   Disk prolapse 04/17/1995   Depression with anxiety 03/18/1995   OCD (obsessive compulsive disorder) 03/18/1995   Severe anxiety with panic 03/18/1995    Allergies:  Allergies  Allergen Reactions   Bee Venom Anaphylaxis, Hives and Swelling    Carries Epi pen.    Cat Dander Itching, Other (See Comments) and Swelling    Allergic to trees, nuts, wheat, grass, cats & dogs - itchy watery eyes, swelling. Uses Zyrtec & Flonase & Benadryl if really bad. Used to get allergy shots. Allergic to trees, nuts, wheat, grass, cats & dogs - itchy watery eyes, swelling. Uses Zyrtec & Flonase & Benadryl if really bad. Used to get allergy shots.   Dog Epithelium Cough and Shortness Of Breath   Dog Epithelium (Canis Lupus Familiaris) Shortness Of Breath   Dog  Fennel Cough and Shortness Of Breath   Dog Fennel Allergy Skin Test Shortness Of Breath   Dust Mite Extract Cough and Shortness Of Breath   Tetracyclines & Related Nausea And Vomiting   Lactose Diarrhea and Nausea And Vomiting   Milk (Cow) Diarrhea and Nausea And Vomiting   Milk Protein Diarrhea and Nausea And Vomiting   Milk-Related Compounds Diarrhea and Nausea And Vomiting   Tape Other (See Comments) and Rash    Needs to use paper tape. Breaks out with severe rash, pulls skin off when using adhesive.   Wound Dressing Adhesive Rash    Other reaction(s): Other Needs to use paper tape. Breaks out with severe rash, pulls skin off when using adhesive.  Needs to use paper tape. Breaks out with severe rash, pulls skin off when using adhesive. Needs to use paper tape. Breaks out with severe rash, pulls skin off when using adhesive.   Medications:  Current Outpatient Medications:    amoxicillin-clavulanate (AUGMENTIN) 875-125 MG tablet, Take 1 tablet by mouth 2 (two) times daily for 7 days., Disp: 14 tablet, Rfl: 0   Accu-Chek FastClix Lancets MISC, USE TO CHECK BLOOD SUGAR UP TO TWICE DAILY AS DIRECTED, Disp: 102 each, Rfl: 5   albuterol (PROVENTIL) (2.5 MG/3ML) 0.083% nebulizer solution, Inhale 3ml (one vial) via  nebulized route every four hours around the clock for 24 hours, followed by one vial every 6 hours for the next 24 hours. After 48 hours, take 3mL via neb every 6-8 hours PRN cough, wheeze, Disp: 150 mL, Rfl: 1   albuterol (VENTOLIN HFA) 108 (90 Base) MCG/ACT inhaler, INHALE 1 TO 2 PUFFS BY MOUTH EVERY 4 TO 6 HOURS AS NEEDED FOR SHORTNESS OF BREATH, Disp: 18 g, Rfl: 2   albuterol (VENTOLIN HFA) 108 (90 Base) MCG/ACT inhaler, Inhale 2 puffs into the lungs every 6 (six) hours as needed for wheezing or shortness of breath., Disp: 8 g, Rfl: 0   ARIPiprazole (ABILIFY) 2 MG tablet, Take 2 mg by mouth every morning. , Disp: , Rfl:    atenolol (TENORMIN) 50 MG tablet, TAKE 1 TABLET(50 MG) BY  MOUTH DAILY, Disp: 90 tablet, Rfl: 0   azelastine (ASTELIN) 0.1 % nasal spray, Place 1 spray into both nostrils 2 (two) times daily. Use in each nostril as directed, Disp: 30 mL, Rfl: 5   Cetirizine HCl 10 MG CAPS, Take 10 mg by mouth daily with lunch. , Disp: , Rfl:    Cyanocobalamin (B-12 COMPLIANCE INJECTION IJ), Inject 1 Dose as directed every 30 (thirty) days., Disp: , Rfl:    fluconazole (DIFLUCAN) 150 MG tablet, Take 1 tablet (150 mg total) by mouth once a week. For 2 to 4 weeks. If resolved in 2 weeks, can pause. If need can finish course., Disp: 4 tablet, Rfl: 0   fluticasone (FLONASE) 50 MCG/ACT nasal spray, SHAKE LIQUID AND USE 2 SPRAYS IN EACH NOSTRIL EVERY DAY, Disp: 48 g, Rfl: 0   fluvoxaMINE (LUVOX) 100 MG tablet, Take 150 mg by mouth every morning. , Disp: , Rfl:    gabapentin (NEURONTIN) 100 MG capsule, Take by mouth., Disp: , Rfl:    glucose blood (ACCU-CHEK GUIDE) test strip, CHECK BLOOD SUGAR UP TO EVERY DAY AS DIRECTED, Disp: 200 strip, Rfl: 5   hydrOXYzine (VISTARIL) 25 MG capsule, TAKE 1 CAPSULE(25 MG) BY MOUTH EVERY 8 HOURS AS NEEDED, Disp: 30 capsule, Rfl: 0   ketoconazole (NIZORAL) 2 % shampoo, APPLY EXTERNALLY 2 TIMES A WEEK, Disp: 120 mL, Rfl: 2   lamoTRIgine (LAMICTAL) 200 MG tablet, Take by mouth., Disp: , Rfl:    levothyroxine (SYNTHROID) 50 MCG tablet, Take 1 tablet (50 mcg total) by mouth daily before breakfast., Disp: 90 tablet, Rfl: 1   medroxyPROGESTERone (DEPO-PROVERA) 150 MG/ML injection, BRING DEPO INJECTION TO OFFICE EACH VISIT, Disp: 1 mL, Rfl: 2   MOUNJARO 5 MG/0.5ML Pen, Inject 5 mg into the skin once a week. (Patient not taking: Reported on 10/22/2023), Disp: 2 mL, Rfl: 2   Multiple Vitamin (MULTIVITAMIN WITH MINERALS) TABS tablet, Take 1 tablet by mouth daily., Disp: , Rfl:    nystatin cream (MYCOSTATIN), Apply 1 application. topically 2 (two) times daily as needed (rash)., Disp: 30 g, Rfl: 5   ondansetron (ZOFRAN-ODT) 4 MG disintegrating tablet, Take 1  tablet (4 mg total) by mouth every 8 (eight) hours as needed for nausea or vomiting., Disp: 20 tablet, Rfl: 0   pantoprazole (PROTONIX) 20 MG tablet, TAKE 1 TABLET(20 MG) BY MOUTH DAILY, Disp: 90 tablet, Rfl: 1   predniSONE (DELTASONE) 20 MG tablet, Take daily with food. Start with 60mg  (3 pills) x 2 days, then reduce to 40mg  (2 pills) x 2 days, then 20mg  (1 pill) x 3 days, Disp: 13 tablet, Rfl: 0   TRELEGY ELLIPTA 100-62.5-25 MCG/ACT AEPB, INHALE 1 PUFF INTO THE  LUNGS DAILY, Disp: 180 each, Rfl: 1   tretinoin (RETIN-A) 0.025 % cream, APPLY EXTERNALLY TO THE AFFECTED AREA AT BEDTIME AS NEEDED, Disp: 45 g, Rfl: 2   triamcinolone cream (KENALOG) 0.5 %, Apply 1 Application topically 2 (two) times daily. To affected areas, for up to 2 weeks., Disp: 30 g, Rfl: 0 No current facility-administered medications for this visit.  Facility-Administered Medications Ordered in Other Visits:    cyanocobalamin (VITAMIN B12) injection 1,000 mcg, 1,000 mcg, Intramuscular, Once, Creig Hines, MD  Observations/Objective: Patient is well-developed, well-nourished in no acute distress.  Resting comfortably  at home.  Head is normocephalic, atraumatic.  No labored breathing.  Speech is clear and coherent with logical content.  Patient is alert and oriented at baseline.    Assessment and Plan: 1. Acute non-recurrent frontal sinusitis (Primary) - amoxicillin-clavulanate (AUGMENTIN) 875-125 MG tablet; Take 1 tablet by mouth 2 (two) times daily for 7 days.  Dispense: 14 tablet; Refill: 0    -Prescribe Augmentin, to be taken twice daily for 7 days. -Continue symptomatic treatment   Follow Up Instructions: I discussed the assessment and treatment plan with the patient. The patient was provided an opportunity to ask questions and all were answered. The patient agreed with the plan and demonstrated an understanding of the instructions.  A copy of instructions were sent to the patient via MyChart unless otherwise  noted below.     The patient was advised to call back or seek an in-person evaluation if the symptoms worsen or if the condition fails to improve as anticipated.    Kasandra Knudsen Mayers, PA-C

## 2024-01-04 NOTE — Patient Instructions (Addendum)
Amanda Webb, thank you for joining Roney Jaffe, PA-C for today's virtual visit.  While this provider is not your primary care provider (PCP), if your PCP is located in our provider database this encounter information will be shared with them immediately following your visit.   A [facility] MyChart account gives you access to today's visit and all your visits, tests, and labs performed at [facility] " click here if you don't have a [facility] MyChart account or go to [email or web address]  Consent: (Patient) Amanda [facility] provided verbal consent for this virtual visit at the beginning of the encounter.  Current Medications:  Current Outpatient Medications:    amoxicillin-clavulanate (AUGMENTIN) 875-125 MG tablet, Take 1 tablet by mouth 2 (two) times daily for 7 days., Disp: 14 tablet, Rfl: 0   Accu-Chek FastClix Lancets MISC, USE TO CHECK BLOOD SUGAR UP TO TWICE DAILY AS DIRECTED, Disp: 102 each, Rfl: 5   albuterol (PROVENTIL) (2.5 MG/3ML) 0.083% nebulizer solution, Inhale 3ml (one vial) via nebulized route every four hours around the clock for 24 hours, followed by one vial every 6 hours for the next 24 hours. After 48 hours, take 3mL via neb every 6-8 hours PRN cough, wheeze, Disp: 150 mL, Rfl: 1   albuterol (VENTOLIN HFA) 108 (90 Base) MCG/ACT inhaler, INHALE 1 TO 2 PUFFS BY MOUTH EVERY 4 TO 6 HOURS AS NEEDED FOR SHORTNESS OF BREATH, Disp: 18 g, Rfl: 2   albuterol (VENTOLIN HFA) 108 (90 Base) MCG/ACT inhaler, Inhale 2 puffs into the lungs every 6 (six) hours as needed for wheezing or shortness of breath., Disp: 8 g, Rfl: 0   ARIPiprazole (ABILIFY) 2 MG tablet, Take 2 mg by mouth every morning. , Disp: , Rfl:    atenolol (TENORMIN) 50 MG tablet, TAKE 1 TABLET(50 MG) BY MOUTH DAILY, Disp: 90 tablet, Rfl: 0   azelastine (ASTELIN) 0.1 % nasal spray, Place 1 spray into both nostrils 2 (two) times daily. Use in each nostril as directed, Disp: 30 mL, Rfl: 5   Cetirizine HCl 10  MG CAPS, Take 10 mg by mouth daily with lunch. , Disp: , Rfl:    Cyanocobalamin (B-12 COMPLIANCE INJECTION IJ), Inject 1 Dose as directed every 30 (thirty) days., Disp: , Rfl:    fluconazole (DIFLUCAN) 150 MG tablet, Take 1 tablet (150 mg total) by mouth once a week. For 2 to 4 weeks. If resolved in 2 weeks, can pause. If need can finish course., Disp: 4 tablet, Rfl: 0   fluticasone (FLONASE) 50 MCG/ACT nasal spray, SHAKE LIQUID AND USE 2 SPRAYS IN EACH NOSTRIL EVERY DAY, Disp: 48 g, Rfl: 0   fluvoxaMINE (LUVOX) 100 MG tablet, Take 150 mg by mouth every morning. , Disp: , Rfl:    gabapentin (NEURONTIN) 100 MG capsule, Take by mouth., Disp: , Rfl:    glucose blood (ACCU-CHEK GUIDE) test strip, CHECK BLOOD SUGAR UP TO EVERY DAY AS DIRECTED, Disp: 200 strip, Rfl: 5   hydrOXYzine (VISTARIL) 25 MG capsule, TAKE 1 CAPSULE(25 MG) BY MOUTH EVERY 8 HOURS AS NEEDED, Disp: 30 capsule, Rfl: 0   ketoconazole (NIZORAL) 2 % shampoo, APPLY EXTERNALLY 2 TIMES A WEEK, Disp: 120 mL, Rfl: 2   lamoTRIgine (LAMICTAL) 200 MG tablet, Take by mouth., Disp: , Rfl:    levothyroxine (SYNTHROID) 50 MCG tablet, Take 1 tablet (50 mcg total) by mouth daily before breakfast., Disp: 90 tablet, Rfl: 1   medroxyPROGESTERone (DEPO-PROVERA) 150 MG/ML injection, BRING DEPO INJECTION TO OFFICE EACH VISIT,  Disp: 1 mL, Rfl: 2   MOUNJARO 5 MG/0.5ML Pen, Inject 5 mg into the skin once a week. (Patient not taking: Reported on 10/22/2023), Disp: 2 mL, Rfl: 2   Multiple Vitamin (MULTIVITAMIN WITH MINERALS) TABS tablet, Take 1 tablet by mouth daily., Disp: , Rfl:    nystatin cream (MYCOSTATIN), Apply 1 application. topically 2 (two) times daily as needed (rash)., Disp: 30 g, Rfl: 5   ondansetron (ZOFRAN-ODT) 4 MG disintegrating tablet, Take 1 tablet (4 mg total) by mouth every 8 (eight) hours as needed for nausea or vomiting., Disp: 20 tablet, Rfl: 0   pantoprazole (PROTONIX) 20 MG tablet, TAKE 1 TABLET(20 MG) BY MOUTH DAILY, Disp: 90 tablet, Rfl:  1   predniSONE (DELTASONE) 20 MG tablet, Take daily with food. Start with 60mg  (3 pills) x 2 days, then reduce to 40mg  (2 pills) x 2 days, then 20mg  (1 pill) x 3 days, Disp: 13 tablet, Rfl: 0   TRELEGY ELLIPTA 100-62.5-25 MCG/ACT AEPB, INHALE 1 PUFF INTO THE LUNGS DAILY, Disp: 180 each, Rfl: 1   tretinoin (RETIN-A) 0.025 % cream, APPLY EXTERNALLY TO THE AFFECTED AREA AT BEDTIME AS NEEDED, Disp: 45 g, Rfl: 2   triamcinolone cream (KENALOG) 0.5 %, Apply 1 Application topically 2 (two) times daily. To affected areas, for up to 2 weeks., Disp: 30 g, Rfl: 0 No current facility-administered medications for this visit.  Facility-Administered Medications Ordered in Other Visits:    cyanocobalamin (VITAMIN B12) injection 1,000 mcg, 1,000 mcg, Intramuscular, Once, Creig Hines, MD   Medications ordered in this encounter:  Meds ordered this encounter  Medications   amoxicillin-clavulanate (AUGMENTIN) 875-125 MG tablet    Sig: Take 1 tablet by mouth 2 (two) times daily for 7 days.    Dispense:  14 tablet    Refill:  0    Supervising Provider:   Merrilee Jansky [[id number]]     *If you need refills on other medications prior to your next appointment, please contact your pharmacy*  Follow-Up: Call back or seek an in-person evaluation if the symptoms worsen or if the condition fails to improve as anticipated.  [facility] [phone]  Other Instructions Sinus Infection, Adult A sinus infection, also called sinusitis, is inflammation of your sinuses. Sinuses are hollow spaces in the bones around your face. Your sinuses are located: Around your eyes. In the middle of your forehead. Behind your nose. In your cheekbones. Mucus normally drains out of your sinuses. When your nasal tissues become inflamed or swollen, mucus can become trapped or blocked. This allows bacteria, viruses, and fungi to grow, which leads to infection. Most infections of the sinuses are caused by a virus. A  sinus infection can develop quickly. It can last for up to 4 weeks (acute) or for more than 12 weeks (chronic). A sinus infection often develops after a cold. What are the causes? This condition is caused by anything that creates swelling in the sinuses or stops mucus from draining. This includes: Allergies. Asthma. Infection from bacteria or viruses. Deformities or blockages in your nose or sinuses. Abnormal growths in the nose (nasal polyps). Pollutants, such as chemicals or irritants in the air. Infection from fungi. This is rare. What increases the risk? You are more likely to develop this condition if you: Have a weak body defense system (immune system). Do a lot of swimming or diving. Overuse nasal sprays. Smoke. What are the signs or symptoms? The main symptoms of this condition are pain and  a feeling of pressure around the affected sinuses. Other symptoms include: Stuffy nose or congestion that makes it difficult to breathe through your nose. Thick yellow or greenish drainage from your nose. Tenderness, swelling, and warmth over the affected sinuses. A cough that may get worse at night. Decreased sense of smell and taste. Extra mucus that collects in the throat or the back of the nose (postnasal drip) causing a sore throat or bad breath. Tiredness (fatigue). Fever. How is this diagnosed? This condition is diagnosed based on: Your symptoms. Your medical history. A physical exam. Tests to find out if your condition is acute or chronic. This may include: Checking your nose for nasal polyps. Viewing your sinuses using a device that has a light (endoscope). Testing for allergies or bacteria. Imaging tests, such as an MRI or CT scan. In rare cases, a bone biopsy may be done to rule out more serious types of fungal sinus disease. How is this treated? Treatment for a sinus infection depends on the cause and whether your condition is chronic or acute. If caused by a virus, your  symptoms should go away on their own within 10 days. You may be given medicines to relieve symptoms. They include: Medicines that shrink swollen nasal passages (decongestants). A spray that eases inflammation of the nostrils (topical intranasal corticosteroids). Rinses that help get rid of thick mucus in your nose (nasal saline washes). Medicines that treat allergies (antihistamines). Over-the-counter pain relievers. If caused by bacteria, your health care provider may recommend waiting to see if your symptoms improve. Most bacterial infections will get better without antibiotic medicine. You may be given antibiotics if you have: A severe infection. A weak immune system. If caused by narrow nasal passages or nasal polyps, surgery may be needed. Follow these instructions at home: Medicines Take, use, or apply over-the-counter and prescription medicines only as told by your health care provider. These may include nasal sprays. If you were prescribed an antibiotic medicine, take it as told by your health care provider. Do not stop taking the antibiotic even if you start to feel better. Hydrate and humidify  Drink enough fluid to keep your urine pale yellow. Staying hydrated will help to thin your mucus. Use a cool mist humidifier to keep the humidity level in your home above 50%. Inhale steam for 10-15 minutes, 3-4 times a day, or as told by your health care provider. You can do this in the bathroom while a hot shower is running. Limit your exposure to cool or dry air. Rest Rest as much as possible. Sleep with your head raised (elevated). Make sure you get enough sleep each night. General instructions  Apply a warm, moist washcloth to your face 3-4 times a day or as told by your health care provider. This will help with discomfort. Use nasal saline washes as often as told by your health care provider. Wash your hands often with soap and water to reduce your exposure to germs. If soap and  water are not available, use hand sanitizer. Do not smoke. Avoid being around people who are smoking (secondhand smoke). Keep all follow-up visits. This is important. Contact a health care provider if: You have a fever. Your symptoms get worse. Your symptoms do not improve within 10 days. Get help right away if: You have a severe headache. You have persistent vomiting. You have severe pain or swelling around your face or eyes. You have vision problems. You develop confusion. Your neck is stiff. You have trouble breathing.  These symptoms may be an emergency. Get help right away. Call 911. Do not wait to see if the symptoms will go away. Do not drive yourself to the hospital. Summary A sinus infection is soreness and inflammation of your sinuses. Sinuses are hollow spaces in the bones around your face. This condition is caused by nasal tissues that become inflamed or swollen. The swelling traps or blocks the flow of mucus. This allows bacteria, viruses, and fungi to grow, which leads to infection. If you were prescribed an antibiotic medicine, take it as told by your health care provider. Do not stop taking the antibiotic even if you start to feel better. Keep all follow-up visits. This is important. This information is not intended to replace advice given to you by your health care provider. Make sure you discuss any questions you have with your health care provider. Document Revised: 11/07/2021 Document Reviewed: 11/07/2021 Elsevier Patient Education  2024 Elsevier Inc.   If you have been instructed to have an in-person evaluation today at a local Urgent Care facility, please use the link below. It will take you to a list of all of our available [facility], including address, phone number and hours of operation. Please do not delay care.  [facility]  If you or a family member do not have a primary care provider, use the link below to schedule a visit and  establish care. When you choose a [facility] primary care physician or advanced practice provider, you gain a long-term partner in health. Find a Primary Care Provider  Learn more about [facility]'s in-office and virtual care options: [facility] Get Care Now

## 2024-01-06 ENCOUNTER — Ambulatory Visit: Payer: Medicare Other

## 2024-01-06 VITALS — BP 135/80 | HR 84 | Ht 67.0 in | Wt 250.0 lb

## 2024-01-06 DIAGNOSIS — Z3042 Encounter for surveillance of injectable contraceptive: Secondary | ICD-10-CM | POA: Diagnosis not present

## 2024-01-06 MED ORDER — MEDROXYPROGESTERONE ACETATE 150 MG/ML IM SUSY
150.0000 mg | PREFILLED_SYRINGE | Freq: Once | INTRAMUSCULAR | Status: AC
  Administered 2024-01-06: 150 mg via INTRAMUSCULAR

## 2024-01-06 NOTE — Progress Notes (Signed)
    NURSE VISIT NOTE  Subjective:    Patient ID: Amanda [facility], female    DOB: 01-19-1971, 53 y.o.   MRN: [id number]  HPI  Patient is a 53 y.o. [id number] female who presents for depo provera injection.   Objective:    BP 135/80   Pulse 84   Ht 5\' 7"  (1.702 m)   Wt 250 lb (113.4 kg)   LMP 10/22/2023 (Exact Date)   BMI 39.16 kg/m   Last Annual: 02/27/23. Last pap: 02/27/23. Last Depo-Provera: 10/21/23. Side Effects if any: none. Serum HCG indicated? No . Depo-Provera 150 mg IM given by: Donnetta Hail, CMA. Site: Left [place]   Assessment:   1. Encounter for surveillance of injectable contraceptive      Plan:   Next appointment due between April 7 and April 21.    Donnetta Hail, CMA

## 2024-01-06 NOTE — Patient Instructions (Signed)
Medroxyprogesterone Injection (Contraception) What is this medication? MEDROXYPROGESTERONE (me DROX ee proe JES te rone) prevents ovulation and pregnancy. It belongs to a group of medications called contraceptives. This medication is a progestin hormone. This medicine may be used for other purposes; ask your health care provider or pharmacist if you have questions. COMMON BRAND NAME(S): Depo-Provera, Depo-subQ Provera 104 What should I tell my care team before I take this medication? They need to know if you have any of these conditions: Asthma Blood clots Breast cancer or family history of breast cancer Depression Diabetes Eating disorder (anorexia nervosa) Frequently drink alcohol Heart attack High blood pressure HIV infection or AIDS Kidney disease Liver disease Migraine headaches Osteoporosis, weak bones Seizures Stroke Tobacco use Vaginal bleeding An unusual or allergic reaction to medroxyprogesterone, other medications, foods, dyes, or preservatives Pregnant or trying to get pregnant Breast-feeding How should I use this medication? Depo-Provera CI contraceptive injection is given into a muscle. Depo-subQ Provera 104 injection is given under the skin. It is given in a hospital or clinic setting. The injection is usually given during the first 5 days after the start of a menstrual period or 6 weeks after delivery of a baby. A patient package insert for the product will be given with each prescription and refill. Be sure to read this information carefully each time. The sheet may change often. Talk to your care team about the use of this medication in children. Special care may be needed. These injections have been used in female children who have started having menstrual periods. Overdosage: If you think you have taken too much of this medicine contact a poison control center or emergency room at once. NOTE: This medicine is only for you. Do not share this medicine with  others. What if I miss a dose? Keep appointments for follow-up doses. You must get an injection once every 3 months. It is important not to miss your dose. Call your care team if you are unable to keep an appointment. What may interact with this medication? Antibiotics or medications for infections, especially rifampin and griseofulvin Antivirals for HIV or hepatitis Aprepitant Armodafinil Bexarotene Bosentan Medications for seizures, such as carbamazepine, felbamate, oxcarbazepine, phenytoin, phenobarbital, primidone, topiramate Mitotane Modafinil St. John's Wort This list may not describe all possible interactions. Give your health care provider a list of all the medicines, herbs, non-prescription drugs, or dietary supplements you use. Also tell them if you smoke, drink alcohol, or use illegal drugs. Some items may interact with your medicine. What should I watch for while using this medication? This medication does not protect you against HIV infection (AIDS) or other sexually transmitted diseases. Use of this product may cause you to lose calcium from your bones. Loss of calcium may cause weak bones (osteoporosis). Only use this product for more than 2 years if other forms of birth control are not right for you. The longer you use this product for birth control the more likely you will be at risk for weak bones. Ask your care team how you can keep strong bones. You may have a change in bleeding pattern or irregular periods. Many females stop having periods while taking this medication. If you have received your injections on time, your chance of being pregnant is very low. If you think you may be pregnant, see your care team as soon as possible. Tell your care team if you want to get pregnant within the next year. The effect of this medication may last a long time   after you get your last injection. What side effects may I notice from receiving this medication? Side effects that you should  report to your care team as soon as possible: Allergic reactions--skin rash, itching, hives, swelling of the face, lips, tongue, or throat Blood clot--pain, swelling, or warmth in the leg, shortness of breath, chest pain Gallbladder problems--severe stomach pain, nausea, vomiting, fever Increase in blood pressure Liver injury--right upper belly pain, loss of appetite, nausea, light-colored stool, dark yellow or brown urine, yellowing skin or eyes, unusual weakness or fatigue New or worsening migraines or headaches Seizures Stroke--sudden numbness or weakness of the face, arm, or leg, trouble speaking, confusion, trouble walking, loss of balance or coordination, dizziness, severe headache, change in vision Unusual vaginal discharge, itching, or odor Worsening mood, feelings of depression Side effects that usually do not require medical attention (report to your care team if they continue or are bothersome): Breast pain or tenderness Dark patches of the skin on the face or other sun-exposed areas Irregular menstrual cycles or spotting Nausea Weight gain This list may not describe all possible side effects. Call your doctor for medical advice about side effects. You may report side effects to FDA at 1-800-FDA-1088. Where should I keep my medication? This injection is only given by a care team. It will not be stored at home. NOTE: This sheet is a summary. It may not cover all possible information. If you have questions about this medicine, talk to your doctor, pharmacist, or health care provider.  2024 Elsevier/Gold Standard (2021-08-16 00:00:00)  

## 2024-01-10 ENCOUNTER — Telehealth: Payer: Medicare Other | Admitting: Physician Assistant

## 2024-01-10 DIAGNOSIS — A084 Viral intestinal infection, unspecified: Secondary | ICD-10-CM | POA: Diagnosis not present

## 2024-01-10 MED ORDER — LOPERAMIDE HCL 2 MG PO TABS: 2.0000 mg | ORAL_TABLET | Freq: Four times a day (QID) | ORAL | 0 refills | Status: DC | PRN

## 2024-01-10 MED ORDER — ONDANSETRON 4 MG PO TBDP: 4.0000 mg | ORAL_TABLET | Freq: Three times a day (TID) | ORAL | 0 refills | Status: DC | PRN

## 2024-01-10 NOTE — Patient Instructions (Addendum)
Amanda Webb, thank you for joining Amanda Loveless, PA-C for today's virtual visit.  While this provider is not your primary Webb provider (PCP), if your PCP is located in our provider database this encounter information will be shared with them immediately following your visit.   A Amanda Webb MyChart account gives you access to today's visit and all your visits, tests, and labs performed at Amanda [facility] ([facility] " click here if you don't have a Amanda Webb MyChart account or go to [email or web address]  Consent: (Patient) Amanda Webb provided verbal consent for this virtual visit at the beginning of the encounter.  Current Medications:  Current Outpatient Medications:    loperamide (IMODIUM A-D) 2 MG tablet, Take 1 tablet (2 mg total) by mouth 4 (four) times daily as needed for diarrhea or loose stools., Disp: 30 tablet, Rfl: 0   ondansetron (ZOFRAN-ODT) 4 MG disintegrating tablet, Take 1 tablet (4 mg total) by mouth every 8 (eight) hours as needed., Disp: 20 tablet, Rfl: 0   Accu-Chek FastClix Lancets MISC, USE TO CHECK BLOOD SUGAR UP TO TWICE DAILY AS DIRECTED, Disp: 102 each, Rfl: 5   albuterol (PROVENTIL) (2.5 MG/3ML) 0.083% nebulizer solution, Inhale 3ml (one vial) via nebulized route every four hours around the clock for 24 hours, followed by one vial every 6 hours for the next 24 hours. After 48 hours, take 3mL via neb every 6-8 hours PRN cough, wheeze, Disp: 150 mL, Rfl: 1   albuterol (VENTOLIN HFA) 108 (90 Base) MCG/ACT inhaler, INHALE 1 TO 2 PUFFS BY MOUTH EVERY 4 TO 6 HOURS AS NEEDED FOR SHORTNESS OF BREATH, Disp: 18 g, Rfl: 2   albuterol (VENTOLIN HFA) 108 (90 Base) MCG/ACT inhaler, Inhale 2 puffs into the lungs every 6 (six) hours as needed for wheezing or shortness of breath., Disp: 8 g, Rfl: 0   amoxicillin-clavulanate (AUGMENTIN) 875-125 MG tablet, Take 1 tablet by mouth 2 (two) times daily for 7 days. (Patient not taking: Reported on 01/06/2024), Disp: 14 tablet,  Rfl: 0   ARIPiprazole (ABILIFY) 2 MG tablet, Take 2 mg by mouth every morning. , Disp: , Rfl:    atenolol (TENORMIN) 50 MG tablet, TAKE 1 TABLET(50 MG) BY MOUTH DAILY, Disp: 90 tablet, Rfl: 0   azelastine (ASTELIN) 0.1 % nasal spray, Place 1 spray into both nostrils 2 (two) times daily. Use in each nostril as directed, Disp: 30 mL, Rfl: 5   Cetirizine HCl 10 MG CAPS, Take 10 mg by mouth daily with lunch. , Disp: , Rfl:    Cyanocobalamin (B-12 COMPLIANCE INJECTION IJ), Inject 1 Dose as directed every 30 (thirty) days., Disp: , Rfl:    fluticasone (FLONASE) 50 MCG/ACT nasal spray, SHAKE LIQUID AND USE 2 SPRAYS IN EACH NOSTRIL EVERY DAY, Disp: 48 g, Rfl: 0   fluvoxaMINE (LUVOX) 100 MG tablet, Take 150 mg by mouth every morning. , Disp: , Rfl:    gabapentin (NEURONTIN) 100 MG capsule, Take by mouth., Disp: , Rfl:    glucose blood (ACCU-CHEK GUIDE) test strip, CHECK BLOOD SUGAR UP TO EVERY DAY AS DIRECTED, Disp: 200 strip, Rfl: 5   hydrOXYzine (VISTARIL) 25 MG capsule, TAKE 1 CAPSULE(25 MG) BY MOUTH EVERY 8 HOURS AS NEEDED, Disp: 30 capsule, Rfl: 0   ketoconazole (NIZORAL) 2 % shampoo, APPLY EXTERNALLY 2 TIMES A WEEK, Disp: 120 mL, Rfl: 2   lamoTRIgine (LAMICTAL) 200 MG tablet, Take by mouth., Disp: , Rfl:    levothyroxine (SYNTHROID) 50 MCG tablet, Take 1 tablet (50 mcg  total) by mouth daily before breakfast., Disp: 90 tablet, Rfl: 1   Multiple Vitamin (MULTIVITAMIN WITH MINERALS) TABS tablet, Take 1 tablet by mouth daily., Disp: , Rfl:    nystatin cream (MYCOSTATIN), Apply 1 application. topically 2 (two) times daily as needed (rash)., Disp: 30 g, Rfl: 5   pantoprazole (PROTONIX) 20 MG tablet, TAKE 1 TABLET(20 MG) BY MOUTH DAILY, Disp: 90 tablet, Rfl: 1   Tirzepatide (MOUNJARO) 5 MG/0.5ML SOPN, SMARTSIG:5 Milligram(s) SUB-Q Once a Week (Patient not taking: Reported on 01/06/2024), Disp: , Rfl:    TRELEGY ELLIPTA 100-62.5-25 MCG/ACT AEPB, INHALE 1 PUFF INTO THE LUNGS DAILY, Disp: 180 each, Rfl: 1    tretinoin (RETIN-A) 0.025 % cream, APPLY EXTERNALLY TO THE AFFECTED AREA AT BEDTIME AS NEEDED, Disp: 45 g, Rfl: 2   triamcinolone cream (KENALOG) 0.5 %, Apply 1 Application topically 2 (two) times daily. To affected areas, for up to 2 weeks., Disp: 30 g, Rfl: 0 No current facility-administered medications for this visit.  Facility-Administered Medications Ordered in Other Visits:    cyanocobalamin (VITAMIN B12) injection 1,000 mcg, 1,000 mcg, Intramuscular, Once, Creig Hines, MD   Medications ordered in this encounter:  Meds ordered this encounter  Medications   ondansetron (ZOFRAN-ODT) 4 MG disintegrating tablet    Sig: Take 1 tablet (4 mg total) by mouth every 8 (eight) hours as needed.    Dispense:  20 tablet    Refill:  0    Supervising Provider:   Merrilee Jansky [[id number]]   loperamide (IMODIUM A-D) 2 MG tablet    Sig: Take 1 tablet (2 mg total) by mouth 4 (four) times daily as needed for diarrhea or loose stools.    Dispense:  30 tablet    Refill:  0    Supervising Provider:   Merrilee Jansky [[id number]]     *If you need refills on other medications prior to your next appointment, please contact your pharmacy*  Follow-Up: Call back or seek an in-person evaluation if the symptoms worsen or if the condition fails to improve as anticipated.  Amanda Webb [phone]  Other Instructions  For your symptoms of diarrhea you may take Imodium 2 mg tablets that are over the counter at your local pharmacy. Take two tablet now and then one after each loose stool up to 6 a day.    Norovirus Infection Norovirus infection causes inflammation in the stomach and intestines (gastroenteritis) and food poisoning. It is caused by exposure to a virus from a group of similar viruses called noroviruses. Norovirus spreads very easily from person to person (is very contagious). It often occurs in places where people are in close contact, such as schools, nursing homes,  restaurants, and cruise ships. You can get it from food, water, surfaces, or other people who have the virus. Norovirus is also found in the stool (feces) or vomit of infected people. You can spread the infection as soon as you feel sick, and you may continue to be contagious after you recover. What are the causes? This condition is caused by contact with norovirus. You can catch norovirus if you: Eat or drink something that is contaminated with norovirus. Touch surfaces or objects that are contaminated with norovirus and then put your hand in or by your mouth or nose. Have direct contact with an infected person who may or may not still have symptoms. Share food, drink, or utensils with someone who is contagious with norovirus. What are the signs or symptoms? Symptoms  usually begin within 12 hours to 2 days after you become infected. Most norovirus symptoms affect the digestive system.Symptoms may include: Nausea, vomiting, and diarrhea. Stomach cramps. Fever. Chills. Headache. Muscle aches and tiredness. How is this diagnosed? This condition may be diagnosed based on: Your symptoms. A physical exam. A stool test. How is this treated? There is no specific treatment for norovirus. Most people get better without treatment in about 2 days. Young children, the elderly, and people who are already sick may take up to 6 days to recover. Follow these instructions at home:  Eating and drinking  Drink plenty of water to replace fluids that are lost through diarrhea and vomiting. This prevents dehydration. Drink enough fluid to keep your urine pale yellow. Drink clear fluids in small amounts as you are able. Clear fluids include water, ice chips, fruit juice with water added (diluted fruit juice), and low-calorie sports drinks. Avoid fluids that contain a lot of sugar or caffeine, such as energy drinks, sports drinks, and soda. Avoid alcohol. If instructed by your health Webb provider, drink an  oral rehydration solution (ORS). This is a drink that is sold at pharmacies and retail stores. An ORS contains minerals (electrolytes) that you can lose through diarrhea and vomiting. Eat bland, easy-to-digest foods in small amounts as you are able. These foods include rice, lean meats, toast, and crackers. Avoid spicy or fatty foods. General instructions Rest at home while you recover. Do not prepare food for others while you are infected. Wait at least 3 days after you recover from the illness to do this. Take over-the-counter and prescription medicines only as told by your health Webb provider. Wash your hands frequently with soap and water for at least 20 seconds. Alcohol-based hand sanitizer can be used in addition to soap and water, but sanitizer should not be the only cleansing method because it is not effective at removing norovirus from your hands or surfaces. Make sure that each person in your household washes his or her hands well and often. Keep all follow-up visits. This is important. How is this prevented? To help prevent the spread of norovirus: Stay at home if you are feeling sick. This will reduce the risk of spreading the virus to others. Wash your hands often with soap and water for at least 20 seconds, especially after using the toilet, helping a child use the toilet, or changing a child's diaper. Wash fruits and vegetables thoroughly before peeling, preparing, or serving them. Throw out any food that a sick person may have touched. Disinfect contaminated surfaces immediately after someone in the household has been sick. Disinfect frequently used surfaces, such as counters, doorknobs, and faucets. Use a bleach-based household cleaner. Immediately remove and wash soiled clothes or sheets. Contact a health Webb provider if: You have vomiting, diarrhea, or stomach pain that gets worse. You have symptoms that do not go away after 3-6 days. You have a fever. You cannot drink  without vomiting. You feel light-headed or dizzy. Your symptoms get worse. Get help right away if: You develop symptoms of dehydration that do not improve with fluid replacement, such as: Excessive sleepiness. Lack of tears. Very little urine production. Dry mouth. Muscle cramps. Weak pulse. Confusion. Summary Norovirus infection is common and often occurs in places where people are in close contact, such as schools, nursing homes, restaurants, and cruise ships. To help prevent the spread of this infection, wash hands with soap and water for at least 20 seconds before handling food or  after having contact with stool or body fluids. There is no specific treatment for norovirus, but most people get better without treatment in about 2 days. People who are healthy when infected often recover sooner than those who are elderly, young, or already sick. Replace lost fluids by drinking plenty of water, or by drinking oral rehydration solution (ORS), which contains important minerals called electrolytes. This prevents dehydration. This information is not intended to replace advice given to you by your health Webb provider. Make sure you discuss any questions you have with your health Webb provider. Document Revised: 07/12/2021 Document Reviewed: 07/12/2021 Elsevier Patient Education  2024 Elsevier Inc.   If you have been instructed to have an in-person evaluation today at a local Urgent [facility] facility, please use the link below. It will take you to a list of all of our available [facility], including address, phone number and hours of operation. Please do not delay Webb.  [facility]  If you or a family member do not have a primary Webb provider, use the link below to schedule a visit and establish Webb. When you choose a [facility] primary Webb physician or advanced practice provider, you gain a long-term partner in health. Find a Primary Webb Provider  Learn more about  [facility] in-office and virtual Webb options: [facility] - Get Webb Now

## 2024-01-10 NOTE — Progress Notes (Signed)
Virtual Visit Consent   Amanda Webb, you are scheduled for a virtual visit with a Amanda Webb provider today. Just as with appointments in the office, your consent must be obtained to participate. Your consent will be active for this visit and any virtual visit you may have with one of our providers in the next 365 days. If you have a MyChart account, a copy of this consent can be sent to you electronically.  As this is a virtual visit, video technology does not allow for your provider to perform a traditional examination. This may limit your provider's ability to fully assess your condition. If your provider identifies any concerns that need to be evaluated in person or the need to arrange testing (such as labs, EKG, etc.), we will make arrangements to do so. Although advances in technology are sophisticated, we cannot ensure that it will always work on either your end or our end. If the connection with a video visit is poor, the visit may have to be switched to a telephone visit. With either a video or telephone visit, we are not always able to ensure that we have a secure connection.  By engaging in this virtual visit, you consent to the provision of healthcare and authorize for your insurance to be billed (if applicable) for the services provided during this visit. Depending on your insurance coverage, you may receive a charge related to this service.  I need to obtain your verbal consent now. Are you willing to proceed with your visit today? [name] has provided verbal consent on 01/10/2024 for a virtual visit (video or telephone). Margaretann Loveless, PA-C  Date: 01/10/2024 5:23 PM  Virtual Visit via Video Note   I, Margaretann Loveless, connected with  Amanda Webb  ([id number], 06/05/1971) on 01/10/24 at  5:15 PM EST by a video-enabled telemedicine application and verified that I am speaking with the correct person using two identifiers.  Location: Patient: Virtual Visit Location  Patient: Home Provider: Virtual Visit Location Provider: Home Office   I discussed the limitations of evaluation and management by telemedicine and the availability of in person appointments. The patient expressed understanding and agreed to proceed.    History of Present Illness: Amanda Webb is a 53 y.o. who identifies as a female who was assigned female at birth, and is being seen today for GI illness.  HPI: Emesis  This is a new problem. The current episode started yesterday. The problem occurs 5 to 10 times per day. The problem has been gradually worsening. The emesis has an appearance of stomach contents. Maximum temperature: 99.7. Associated symptoms include abdominal pain, chills, diarrhea (watery), a fever and sweats. She has tried increased fluids for the symptoms. The treatment provided no relief.     Problems:  Patient Active Problem List   Diagnosis Date Noted   Major depressive disorder, recurrent, in partial remission (HCC) 11/08/2020   Cough 10/21/2020   Status post laparoscopic cholecystectomy 07/28/2020   Morbid obesity (HCC) 07/05/2020   Hepatic steatosis 06/23/2020   Lymphedema 03/09/2020   Chronic venous insufficiency 03/09/2020   Bilateral lower extremity edema 02/16/2020   Female cystocele 10/21/2018   Urinary incontinence, mixed 10/21/2018   B12 deficiency 04/08/2018   Iron deficiency anemia 03/14/2018   GAD (generalized anxiety disorder) 03/10/2018   Acute right-sided low back pain 10/28/2017   Tobacco abuse 05/09/2017   Multiple thyroid nodules 12/28/2016   Simple chronic bronchitis (HCC) 12/13/2016   BMI 40.0-44.9, adult (  HCC) 09/26/2016   HLD (hyperlipidemia) 09/26/2016   Vitamin D deficiency 09/26/2016   Type 2 diabetes mellitus with other specified complication (HCC) 06/17/2015   Candidiasis of skin and nail 02/21/2015   Candidal intertrigo 02/21/2015   Cellulitis of right lower extremity 02/18/2015   Benign cyst of right kidney 01/17/2015    Chronic sinusitis 11/15/2014   Whiplash injury 09/03/2014   Heart palpitations 04/02/2014   Palpitations 04/02/2014   Allergic rhinitis 12/31/2013   Spondylosis of lumbar region without myelopathy or radiculopathy 12/28/2013   Chronic low back pain 09/28/2013   Other fatigue 09/28/2013   Hypothyroidism 09/28/2013   Alteration of body temperature 09/28/2013   Other fatigue 09/28/2013   Plantar fasciitis, bilateral 07/10/2013   Tarsal tunnel syndrome of right side 07/10/2013   Plantar fascial fibromatosis 07/10/2013   Encounter for surveillance of injectable contraceptive 05/13/2013   Diuresis excessive 03/24/2013   Polyuria 03/24/2013   Abnormal uterine and vaginal bleeding, unspecified 03/24/2013   Arthritis, multiple joint involvement 03/17/2013   Fibroblastic disorder 07/28/2012   Borderline personality disorder (HCC) 12/17/2010   Borderline personality disorder in adult [facility]) 12/17/2010   Essential hypertension 02/18/2010   Carpal tunnel syndrome on both sides 12/17/2006   Major depressive disorder, single episode 02/21/1998   Major depressive disorder, single episode 02/21/1998   Disk prolapse 04/17/1995   Depression with anxiety 03/18/1995   OCD (obsessive compulsive disorder) 03/18/1995   Severe anxiety with panic 03/18/1995    Allergies:  Allergies  Allergen Reactions   Bee Venom Anaphylaxis, Hives and Swelling    Carries Epi pen.    Cat Dander Itching, Other (See Comments) and Swelling    Allergic to trees, nuts, wheat, grass, cats & dogs - itchy watery eyes, swelling. Uses Zyrtec & Flonase & Benadryl if really bad. Used to get allergy shots. Allergic to trees, nuts, wheat, grass, cats & dogs - itchy watery eyes, swelling. Uses Zyrtec & Flonase & Benadryl if really bad. Used to get allergy shots.   Dog Epithelium Cough and Shortness Of Breath   Dog Epithelium (Canis Lupus Familiaris) Shortness Of Breath   Dog Fennel Cough and Shortness Of Breath   Dog Fennel Allergy  Skin Test Shortness Of Breath   Dust Mite Extract Cough and Shortness Of Breath   Tetracyclines & Related Nausea And Vomiting   Lactose Diarrhea and Nausea And Vomiting   Milk (Cow) Diarrhea and Nausea And Vomiting   Milk Protein Diarrhea and Nausea And Vomiting   Milk-Related Compounds Diarrhea and Nausea And Vomiting   Tape Other (See Comments) and Rash    Needs to use paper tape. Breaks out with severe rash, pulls skin off when using adhesive.   Wound Dressing Adhesive Rash    Other reaction(s): Other Needs to use paper tape. Breaks out with severe rash, pulls skin off when using adhesive.  Needs to use paper tape. Breaks out with severe rash, pulls skin off when using adhesive. Needs to use paper tape. Breaks out with severe rash, pulls skin off when using adhesive.   Medications:  Current Outpatient Medications:    loperamide (IMODIUM A-D) 2 MG tablet, Take 1 tablet (2 mg total) by mouth 4 (four) times daily as needed for diarrhea or loose stools., Disp: 30 tablet, Rfl: 0   ondansetron (ZOFRAN-ODT) 4 MG disintegrating tablet, Take 1 tablet (4 mg total) by mouth every 8 (eight) hours as needed., Disp: 20 tablet, Rfl: 0   Accu-Chek FastClix Lancets MISC, USE TO CHECK BLOOD SUGAR  UP TO TWICE DAILY AS DIRECTED, Disp: 102 each, Rfl: 5   albuterol (PROVENTIL) (2.5 MG/3ML) 0.083% nebulizer solution, Inhale 3ml (one vial) via nebulized route every four hours around the clock for 24 hours, followed by one vial every 6 hours for the next 24 hours. After 48 hours, take 3mL via neb every 6-8 hours PRN cough, wheeze, Disp: 150 mL, Rfl: 1   albuterol (VENTOLIN HFA) 108 (90 Base) MCG/ACT inhaler, INHALE 1 TO 2 PUFFS BY MOUTH EVERY 4 TO 6 HOURS AS NEEDED FOR SHORTNESS OF BREATH, Disp: 18 g, Rfl: 2   albuterol (VENTOLIN HFA) 108 (90 Base) MCG/ACT inhaler, Inhale 2 puffs into the lungs every 6 (six) hours as needed for wheezing or shortness of breath., Disp: 8 g, Rfl: 0   amoxicillin-clavulanate  (AUGMENTIN) 875-125 MG tablet, Take 1 tablet by mouth 2 (two) times daily for 7 days. (Patient not taking: Reported on 01/06/2024), Disp: 14 tablet, Rfl: 0   ARIPiprazole (ABILIFY) 2 MG tablet, Take 2 mg by mouth every morning. , Disp: , Rfl:    atenolol (TENORMIN) 50 MG tablet, TAKE 1 TABLET(50 MG) BY MOUTH DAILY, Disp: 90 tablet, Rfl: 0   azelastine (ASTELIN) 0.1 % nasal spray, Place 1 spray into both nostrils 2 (two) times daily. Use in each nostril as directed, Disp: 30 mL, Rfl: 5   Cetirizine HCl 10 MG CAPS, Take 10 mg by mouth daily with lunch. , Disp: , Rfl:    Cyanocobalamin (B-12 COMPLIANCE INJECTION IJ), Inject 1 Dose as directed every 30 (thirty) days., Disp: , Rfl:    fluticasone (FLONASE) 50 MCG/ACT nasal spray, SHAKE LIQUID AND USE 2 SPRAYS IN EACH NOSTRIL EVERY DAY, Disp: 48 g, Rfl: 0   fluvoxaMINE (LUVOX) 100 MG tablet, Take 150 mg by mouth every morning. , Disp: , Rfl:    gabapentin (NEURONTIN) 100 MG capsule, Take by mouth., Disp: , Rfl:    glucose blood (ACCU-CHEK GUIDE) test strip, CHECK BLOOD SUGAR UP TO EVERY DAY AS DIRECTED, Disp: 200 strip, Rfl: 5   hydrOXYzine (VISTARIL) 25 MG capsule, TAKE 1 CAPSULE(25 MG) BY MOUTH EVERY 8 HOURS AS NEEDED, Disp: 30 capsule, Rfl: 0   ketoconazole (NIZORAL) 2 % shampoo, APPLY EXTERNALLY 2 TIMES A WEEK, Disp: 120 mL, Rfl: 2   lamoTRIgine (LAMICTAL) 200 MG tablet, Take by mouth., Disp: , Rfl:    levothyroxine (SYNTHROID) 50 MCG tablet, Take 1 tablet (50 mcg total) by mouth daily before breakfast., Disp: 90 tablet, Rfl: 1   Multiple Vitamin (MULTIVITAMIN WITH MINERALS) TABS tablet, Take 1 tablet by mouth daily., Disp: , Rfl:    nystatin cream (MYCOSTATIN), Apply 1 application. topically 2 (two) times daily as needed (rash)., Disp: 30 g, Rfl: 5   pantoprazole (PROTONIX) 20 MG tablet, TAKE 1 TABLET(20 MG) BY MOUTH DAILY, Disp: 90 tablet, Rfl: 1   Tirzepatide (MOUNJARO) 5 MG/0.5ML SOPN, SMARTSIG:5 Milligram(s) SUB-Q Once a Week (Patient not taking:  Reported on 01/06/2024), Disp: , Rfl:    TRELEGY ELLIPTA 100-62.5-25 MCG/ACT AEPB, INHALE 1 PUFF INTO THE LUNGS DAILY, Disp: 180 each, Rfl: 1   tretinoin (RETIN-A) 0.025 % cream, APPLY EXTERNALLY TO THE AFFECTED AREA AT BEDTIME AS NEEDED, Disp: 45 g, Rfl: 2   triamcinolone cream (KENALOG) 0.5 %, Apply 1 Application topically 2 (two) times daily. To affected areas, for up to 2 weeks., Disp: 30 g, Rfl: 0 No current facility-administered medications for this visit.  Facility-Administered Medications Ordered in Other Visits:    cyanocobalamin (VITAMIN B12) injection  1,000 mcg, 1,000 mcg, Intramuscular, Once, Creig Hines, MD  Observations/Objective: Patient is well-developed, well-nourished in no acute distress.  Resting comfortably at home.  Head is normocephalic, atraumatic.  No labored breathing.  Speech is clear and coherent with logical content.  Patient is alert and oriented at baseline.    Assessment and Plan: 1. Viral gastroenteritis (Primary) - ondansetron (ZOFRAN-ODT) 4 MG disintegrating tablet; Take 1 tablet (4 mg total) by mouth every 8 (eight) hours as needed.  Dispense: 20 tablet; Refill: 0 - loperamide (IMODIUM A-D) 2 MG tablet; Take 1 tablet (2 mg total) by mouth 4 (four) times daily as needed for diarrhea or loose stools.  Dispense: 30 tablet; Refill: 0  - Suspect viral gastroenteritis - Zofran for nausea - Imodium for diarrhea - Push fluids, electrolyte beverages - Liquid diet, then increase to soft/bland (BRAT) diet over next day, then increase diet as tolerated - Seek in person evaluation if not improving or symptoms worsen   Follow Up Instructions: I discussed the assessment and treatment plan with the patient. The patient was provided an opportunity to ask questions and all were answered. The patient agreed with the plan and demonstrated an understanding of the instructions.  A copy of instructions were sent to the patient via MyChart unless otherwise noted below.     The patient was advised to call back or seek an in-person evaluation if the symptoms worsen or if the condition fails to improve as anticipated.    Margaretann Loveless, PA-C

## 2024-01-13 ENCOUNTER — Encounter: Payer: Self-pay | Admitting: Family Medicine

## 2024-01-13 ENCOUNTER — Telehealth (INDEPENDENT_AMBULATORY_CARE_PROVIDER_SITE_OTHER): Payer: Medicare Other | Admitting: Family Medicine

## 2024-01-13 DIAGNOSIS — A0811 Acute gastroenteropathy due to Norwalk agent: Secondary | ICD-10-CM

## 2024-01-13 DIAGNOSIS — A084 Viral intestinal infection, unspecified: Secondary | ICD-10-CM

## 2024-01-13 DIAGNOSIS — R112 Nausea with vomiting, unspecified: Secondary | ICD-10-CM

## 2024-01-13 MED ORDER — ONDANSETRON 4 MG PO TBDP: 4.0000 mg | ORAL_TABLET | Freq: Three times a day (TID) | ORAL | 0 refills | Status: DC | PRN

## 2024-01-13 NOTE — Progress Notes (Signed)
Subjective:    Patient ID: Amanda Webb, female    DOB: 02-20-1971, 53 y.o.   MRN: [id number]  TRANIKA Webb is a 53 y.o. female presenting on 01/13/2024 for norovirus (diarrhea)   Virtual / Telehealth Encounter - Video Visit via MyChart The purpose of this virtual visit is to provide medical care while limiting exposure to the novel coronavirus (COVID19) for both patient and office staff.  Consent was obtained for remote visit:  Yes.   Answered questions that patient had about telehealth interaction:  Yes.   I discussed the limitations, risks, security and privacy concerns of performing an evaluation and management service by video/telephone. I also discussed with the patient that there may be a patient responsible charge related to this service. The patient expressed understanding and agreed to proceed.  Patient Location: Home Provider Location: [facility] (Office)  Participants in virtual visit: - Patient: Amanda Webb - CMA: Fuller Plan CMA - Provider: Dr Althea Charon   HPI  Discussed the use of AI scribe software for clinical note transcription with the patient, who gave verbal consent to proceed.  History of Present Illness    The patient, with a recent diagnosis of norovirus, presents with severe, uncontrolled diarrhea that began on Thursday morning. Despite taking loperamide as directed by virtual visit provider. She has also been taking Zofran, which has successfully controlled the vomiting. However, she reports near exhaustion, which she attributes to the virus and the effort to maintain hydration. She has been consuming significant amounts of Gatorade to combat the rapid fluid loss but feels she is "losing it faster than I can get it back in."  The patient also reports that she has been unable to consume solid foods since the onset of the illness. Despite feeling extremely unwell, the patient's employer requested she return to work, which she  refused due to the risk of infecting others. She requested an extension of her sick leave.  The patient's family members have also contracted the virus, further complicating her situation and adding to her stress. She is seeking additional treatment options and a better understanding of the expected duration of her illness.          10/22/2023    8:22 AM 09/03/2023   10:54 AM 11/19/2022   10:50 AM  Depression screen PHQ 2/9  Decreased Interest 2 2 3   Down, Depressed, Hopeless 1 1 3   PHQ - 2 Score 3 3 6   Altered sleeping 0 0 3  Tired, decreased energy 1 2 3   Change in appetite 0 0 3  Feeling bad or failure about yourself  2 1 3   Trouble concentrating 0 0 2  Moving slowly or fidgety/restless 0 0 0  Suicidal thoughts 0 0 0  PHQ-9 Score 6 6 20   Difficult doing work/chores Somewhat difficult Very difficult Not difficult at all       10/22/2023    8:22 AM 09/03/2023   10:54 AM 11/19/2022   10:50 AM 10/19/2022    3:27 PM  GAD 7 : Generalized Anxiety Score  Nervous, Anxious, on Edge 2 2 3 3   Control/stop worrying 2 2 3 3   Worry too much - different things 2 2 3 3   Trouble relaxing 2 2 3 3   Restless 0 1 3 0  Easily annoyed or irritable 3 3 3 3   Afraid - awful might happen 3 2 3 3   Total GAD 7 Score 14 14 21 18   Anxiety Difficulty  Somewhat difficult Somewhat difficult    Social History   Tobacco Use   Smoking status: Every Day    Current packs/day: 1.00    Average packs/day: 1 pack/day for 32.0 years (32.0 ttl pk-yrs)    Types: Cigarettes    Passive exposure: Current   Smokeless tobacco: Never   Tobacco comments:    1 ppd/ 11/22/2020  Vaping Use   Vaping status: Never Used  Substance Use Topics   Alcohol use: Yes    Alcohol/week: 3.0 standard drinks of alcohol    Types: 3 Glasses of wine per week   Drug use: No    Review of Systems Per HPI unless specifically indicated above     Objective:    LMP 10/22/2023 (Exact Date)   Wt Readings from Last 3 Encounters:   01/06/24 250 lb (113.4 kg)  10/22/23 241 lb (109.3 kg)  10/21/23 240 lb (108.9 kg)     Physical Exam  Note examination was completely remotely via video observation objective data only  Gen - well-appearing, no acute distress or apparent pain, comfortable HEENT - eyes appear clear without discharge or redness Heart/Lungs - cannot examine virtually - observed no evidence of coughing or labored breathing. Abd - cannot examine virtually  Skin - face visible today- no rash Neuro - awake, alert, oriented Psych - not anxious appearing   Results for orders placed or performed in visit on 10/22/23  HIV Antibody (routine testing w rflx)   Collection Time: 10/22/23  9:01 AM  Result Value Ref Range   HIV 1&2 Ab, 4th Generation NON-REACTIVE NON-REACTIVE      Assessment & Plan:   Problem List Items Addressed This Visit   None Visit Diagnoses       Norovirus    -  Primary     Nausea and vomiting, unspecified vomiting type         Viral gastroenteritis       Relevant Medications   ondansetron (ZOFRAN-ODT) 4 MG disintegrating tablet        Norovirus infection Severe diarrhea and vomiting since Thursday. Currently taking Zofran and Loperamide with partial relief. Discussed the correct dosing of Loperamide and the importance of maintaining hydration.  Family members sick contacts  -Continue Zofran as needed for nausea and vomiting. Refill prescription w/ new instructions for 30 tablets. -Continue Loperamide with 2 pills initially, then 1 pill after each loose stool, not to exceed 8 pills in 24 hours. -Continue aggressive hydration with Gatorade or similar electrolyte solution. -Consider over-the-counter remedies such as peppermint oil capsules for additional symptomatic relief.  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 -Extend work absence through Friday, 01/17/2024. Patient may return to work earlier if symptoms  improve significantly.         No orders of the defined types were placed in this encounter.   Meds ordered this encounter  Medications   ondansetron (ZOFRAN-ODT) 4 MG disintegrating tablet    Sig: Take 1-2 tablets (4-8 mg total) by mouth every 8 (eight) hours as needed.    Dispense:  30 tablet    Refill:  0    Follow up plan: Return if symptoms worsen or fail to improve.   Patient verbalizes understanding with the above medical recommendations including the limitation of remote medical advice.  Specific follow-up and call-back criteria were given for patient to follow-up or seek medical care more urgently if needed.  Total duration of  direct patient care provided via video conference: 8 minutes   Saralyn Pilar, DO [facility] 01/13/2024, 11:51 AM

## 2024-01-13 NOTE — Patient Instructions (Addendum)
Thank you for coming to the office today.  Keep on Loperamide 2 doses at once first dose of day, then up to 6 more doses through the rest of the 24 hours. Every 4 hour or sooner or later.  Refill Zofran as needed.  Herbal IBgard OTC Peppermint Oil (Triple Coated Capsule) 180mg  take one 3 times daily to reduce diarrhea  New work note extended through Thursday.  Please schedule a Follow-up Appointment to: Return if symptoms worsen or fail to improve.  If you have any other questions or concerns, please feel free to call the office or send a message through MyChart. You may also schedule an earlier appointment if necessary.  Additionally, you may be receiving a survey about your experience at our office within a few days to 1 week by e-mail or mail. We value your feedback.  Saralyn Pilar, DO [facility], [place]

## 2024-01-27 ENCOUNTER — Encounter: Payer: Self-pay | Admitting: Family Medicine

## 2024-01-27 ENCOUNTER — Inpatient Hospital Stay: Payer: Medicare Other | Attending: Oncology

## 2024-01-27 DIAGNOSIS — D519 Vitamin B12 deficiency anemia, unspecified: Secondary | ICD-10-CM | POA: Insufficient documentation

## 2024-01-29 ENCOUNTER — Telehealth: Payer: Medicare Other | Admitting: Family

## 2024-01-29 ENCOUNTER — Encounter: Payer: Self-pay | Admitting: Family

## 2024-01-29 DIAGNOSIS — R6889 Other general symptoms and signs: Secondary | ICD-10-CM | POA: Diagnosis not present

## 2024-01-29 DIAGNOSIS — Z20828 Contact with and (suspected) exposure to other viral communicable diseases: Secondary | ICD-10-CM

## 2024-01-29 MED ORDER — OSELTAMIVIR PHOSPHATE 75 MG PO CAPS: 75.0000 mg | ORAL_CAPSULE | Freq: Two times a day (BID) | ORAL | 0 refills | Status: DC

## 2024-01-29 MED ORDER — BENZONATATE 200 MG PO CAPS: 200.0000 mg | ORAL_CAPSULE | Freq: Two times a day (BID) | ORAL | 0 refills | Status: DC | PRN

## 2024-01-29 NOTE — Progress Notes (Signed)
Virtual Visit Consent   Amanda Webb, you are scheduled for a virtual visit with a [place] provider today. Just as with appointments in the office, your consent must be obtained to participate. Your consent will be active for this visit and any virtual visit you may have with one of our providers in the next 365 days. If you have a MyChart account, a copy of this consent can be sent to you electronically.  As this is a virtual visit, video technology does not allow for your provider to perform a traditional examination. This may limit your provider's ability to fully assess your condition. If your provider identifies any concerns that need to be evaluated in person or the need to arrange testing (such as labs, EKG, etc.), we will make arrangements to do so. Although advances in technology are sophisticated, we cannot ensure that it will always work on either your end or our end. If the connection with a video visit is poor, the visit may have to be switched to a telephone visit. With either a video or telephone visit, we are not always able to ensure that we have a secure connection.  By engaging in this virtual visit, you consent to the provision of healthcare and authorize for your insurance to be billed (if applicable) for the services provided during this visit. Depending on your insurance coverage, you may receive a charge related to this service.  I need to obtain your verbal consent now. Are you willing to proceed with your visit today? Amanda Webb has provided verbal consent on 01/29/2024 for a virtual visit (video or telephone). Jannifer Rodney, FNP  Date: 01/29/2024 4:00 PM   Virtual Visit via Video Note   I, Jannifer Rodney, connected with  Amanda Webb  ([id number], 11-08-1971) on 01/29/24 at  3:45 PM EST by a video-enabled telemedicine application and verified that I am speaking with the correct person using two identifiers.  Location: Patient: Virtual Visit Location Patient:  Home Provider: Virtual Visit Location Provider: Home Office   I discussed the limitations of evaluation and management by telemedicine and the availability of in person appointments. The patient expressed understanding and agreed to proceed.    History of Present Illness: Amanda Webb is a 53 y.o. who identifies as a female who was assigned female at birth, and is being seen today for cough that started two days ago. Reports her grandson was diagnosed with Flu A this morning.   HPI: Influenza Associated symptoms include chills, coughing, a fever (83F) and myalgias. Pertinent negatives include no sore throat.  Cough This is a new problem. The current episode started in the past 7 days. The problem has been unchanged. The problem occurs every few minutes. The cough is Non-productive. Associated symptoms include chills, a fever (83F), myalgias, nasal congestion, shortness of breath and wheezing. Pertinent negatives include no ear congestion, ear pain or sore throat. Risk factors for lung disease include smoking/tobacco exposure. She has tried rest and OTC cough suppressant for the symptoms. The treatment provided mild relief. Her past medical history is significant for emphysema.    Problems:  Patient Active Problem List   Diagnosis Date Noted   Major depressive disorder, recurrent, in partial remission (HCC) 11/08/2020   Cough 10/21/2020   Status post laparoscopic cholecystectomy 07/28/2020   Morbid obesity (HCC) 07/05/2020   Hepatic steatosis 06/23/2020   Lymphedema 03/09/2020   Chronic venous insufficiency 03/09/2020   Bilateral lower extremity edema 02/16/2020   Female cystocele  10/21/2018   Urinary incontinence, mixed 10/21/2018   B12 deficiency 04/08/2018   Iron deficiency anemia 03/14/2018   GAD (generalized anxiety disorder) 03/10/2018   Acute right-sided low back pain 10/28/2017   Tobacco abuse 05/09/2017   Multiple thyroid nodules 12/28/2016   Simple chronic bronchitis (HCC)  12/13/2016   BMI 40.0-44.9, adult (HCC) 09/26/2016   HLD (hyperlipidemia) 09/26/2016   Vitamin D deficiency 09/26/2016   Type 2 diabetes mellitus with other specified complication (HCC) 06/17/2015   Candidiasis of skin and nail 02/21/2015   Candidal intertrigo 02/21/2015   Cellulitis of right lower extremity 02/18/2015   Benign cyst of right kidney 01/17/2015   Chronic sinusitis 11/15/2014   Whiplash injury 09/03/2014   Heart palpitations 04/02/2014   Palpitations 04/02/2014   Allergic rhinitis 12/31/2013   Spondylosis of lumbar region without myelopathy or radiculopathy 12/28/2013   Chronic low back pain 09/28/2013   Other fatigue 09/28/2013   Hypothyroidism 09/28/2013   Alteration of body temperature 09/28/2013   Other fatigue 09/28/2013   Plantar fasciitis, bilateral 07/10/2013   Tarsal tunnel syndrome of right side 07/10/2013   Plantar fascial fibromatosis 07/10/2013   Encounter for surveillance of injectable contraceptive 05/13/2013   Diuresis excessive 03/24/2013   Polyuria 03/24/2013   Abnormal uterine and vaginal bleeding, unspecified 03/24/2013   Arthritis, multiple joint involvement 03/17/2013   Fibroblastic disorder 07/28/2012   Borderline personality disorder (HCC) 12/17/2010   Borderline personality disorder in adult [facility]) 12/17/2010   Essential hypertension 02/18/2010   Carpal tunnel syndrome on both sides 12/17/2006   Major depressive disorder, single episode 02/21/1998   Major depressive disorder, single episode 02/21/1998   Disk prolapse 04/17/1995   Depression with anxiety 03/18/1995   OCD (obsessive compulsive disorder) 03/18/1995   Severe anxiety with panic 03/18/1995    Allergies:  Allergies  Allergen Reactions   Bee Venom Anaphylaxis, Hives and Swelling    Carries Epi pen.    Cat Dander Itching, Other (See Comments) and Swelling    Allergic to trees, nuts, wheat, grass, cats & dogs - itchy watery eyes, swelling. Uses Zyrtec & Flonase & Benadryl if  really bad. Used to get allergy shots. Allergic to trees, nuts, wheat, grass, cats & dogs - itchy watery eyes, swelling. Uses Zyrtec & Flonase & Benadryl if really bad. Used to get allergy shots.   Dog Epithelium Cough and Shortness Of Breath   Dog Epithelium (Canis Lupus Familiaris) Shortness Of Breath   Dog Fennel Cough and Shortness Of Breath   Dog Fennel Allergy Skin Test Shortness Of Breath   Dust Mite Extract Cough and Shortness Of Breath   Tetracyclines & Related Nausea And Vomiting   Lactose Diarrhea and Nausea And Vomiting   Milk (Cow) Diarrhea and Nausea And Vomiting   Milk Protein Diarrhea and Nausea And Vomiting   Milk-Related Compounds Diarrhea and Nausea And Vomiting   Tape Other (See Comments) and Rash    Needs to use paper tape. Breaks out with severe rash, pulls skin off when using adhesive.   Wound Dressing Adhesive Rash    Other reaction(s): Other Needs to use paper tape. Breaks out with severe rash, pulls skin off when using adhesive.  Needs to use paper tape. Breaks out with severe rash, pulls skin off when using adhesive. Needs to use paper tape. Breaks out with severe rash, pulls skin off when using adhesive.   Medications:  Current Outpatient Medications:    benzonatate (TESSALON) 200 MG capsule, Take 1 capsule (200 mg total) by  mouth 2 (two) times daily as needed for cough., Disp: 20 capsule, Rfl: 0   oseltamivir (TAMIFLU) 75 MG capsule, Take 1 capsule (75 mg total) by mouth 2 (two) times daily., Disp: 10 capsule, Rfl: 0   Accu-Chek FastClix Lancets MISC, USE TO CHECK BLOOD SUGAR UP TO TWICE DAILY AS DIRECTED, Disp: 102 each, Rfl: 5   albuterol (PROVENTIL) (2.5 MG/3ML) 0.083% nebulizer solution, Inhale 3ml (one vial) via nebulized route every four hours around the clock for 24 hours, followed by one vial every 6 hours for the next 24 hours. After 48 hours, take 3mL via neb every 6-8 hours PRN cough, wheeze, Disp: 150 mL, Rfl: 1   albuterol (VENTOLIN HFA) 108 (90  Base) MCG/ACT inhaler, INHALE 1 TO 2 PUFFS BY MOUTH EVERY 4 TO 6 HOURS AS NEEDED FOR SHORTNESS OF BREATH, Disp: 18 g, Rfl: 2   albuterol (VENTOLIN HFA) 108 (90 Base) MCG/ACT inhaler, Inhale 2 puffs into the lungs every 6 (six) hours as needed for wheezing or shortness of breath., Disp: 8 g, Rfl: 0   ARIPiprazole (ABILIFY) 2 MG tablet, Take 2 mg by mouth every morning. , Disp: , Rfl:    atenolol (TENORMIN) 50 MG tablet, TAKE 1 TABLET(50 MG) BY MOUTH DAILY, Disp: 90 tablet, Rfl: 0   azelastine (ASTELIN) 0.1 % nasal spray, Place 1 spray into both nostrils 2 (two) times daily. Use in each nostril as directed, Disp: 30 mL, Rfl: 5   Cetirizine HCl 10 MG CAPS, Take 10 mg by mouth daily with lunch. , Disp: , Rfl:    Cyanocobalamin (B-12 COMPLIANCE INJECTION IJ), Inject 1 Dose as directed every 30 (thirty) days., Disp: , Rfl:    fluticasone (FLONASE) 50 MCG/ACT nasal spray, SHAKE LIQUID AND USE 2 SPRAYS IN EACH NOSTRIL EVERY DAY, Disp: 48 g, Rfl: 0   fluvoxaMINE (LUVOX) 100 MG tablet, Take 150 mg by mouth every morning. , Disp: , Rfl:    gabapentin (NEURONTIN) 100 MG capsule, Take by mouth., Disp: , Rfl:    glucose blood (ACCU-CHEK GUIDE) test strip, CHECK BLOOD SUGAR UP TO EVERY DAY AS DIRECTED, Disp: 200 strip, Rfl: 5   hydrOXYzine (VISTARIL) 25 MG capsule, TAKE 1 CAPSULE(25 MG) BY MOUTH EVERY 8 HOURS AS NEEDED, Disp: 30 capsule, Rfl: 0   ketoconazole (NIZORAL) 2 % shampoo, APPLY EXTERNALLY 2 TIMES A WEEK, Disp: 120 mL, Rfl: 2   lamoTRIgine (LAMICTAL) 200 MG tablet, Take by mouth., Disp: , Rfl:    levothyroxine (SYNTHROID) 50 MCG tablet, Take 1 tablet (50 mcg total) by mouth daily before breakfast., Disp: 90 tablet, Rfl: 1   loperamide (IMODIUM A-D) 2 MG tablet, Take 1 tablet (2 mg total) by mouth 4 (four) times daily as needed for diarrhea or loose stools., Disp: 30 tablet, Rfl: 0   Multiple Vitamin (MULTIVITAMIN WITH MINERALS) TABS tablet, Take 1 tablet by mouth daily., Disp: , Rfl:    nystatin cream  (MYCOSTATIN), Apply 1 application. topically 2 (two) times daily as needed (rash)., Disp: 30 g, Rfl: 5   ondansetron (ZOFRAN-ODT) 4 MG disintegrating tablet, Take 1-2 tablets (4-8 mg total) by mouth every 8 (eight) hours as needed., Disp: 30 tablet, Rfl: 0   pantoprazole (PROTONIX) 20 MG tablet, TAKE 1 TABLET(20 MG) BY MOUTH DAILY, Disp: 90 tablet, Rfl: 1   Tirzepatide (MOUNJARO) 5 MG/0.5ML SOPN, SMARTSIG:5 Milligram(s) SUB-Q Once a Week (Patient not taking: Reported on 01/06/2024), Disp: , Rfl:    TRELEGY ELLIPTA 100-62.5-25 MCG/ACT AEPB, INHALE 1 PUFF INTO THE  LUNGS DAILY, Disp: 180 each, Rfl: 1   tretinoin (RETIN-A) 0.025 % cream, APPLY EXTERNALLY TO THE AFFECTED AREA AT BEDTIME AS NEEDED, Disp: 45 g, Rfl: 2   triamcinolone cream (KENALOG) 0.5 %, Apply 1 Application topically 2 (two) times daily. To affected areas, for up to 2 weeks., Disp: 30 g, Rfl: 0 No current facility-administered medications for this visit.  Facility-Administered Medications Ordered in Other Visits:    cyanocobalamin (VITAMIN B12) injection 1,000 mcg, 1,000 mcg, Intramuscular, Once, Creig Hines, MD  Observations/Objective: Patient is well-developed, well-nourished in no acute distress.  Resting comfortably  at home.  Head is normocephalic, atraumatic.  No labored breathing.  Speech is clear and coherent with logical content.  Patient is alert and oriented at baseline.    Assessment and Plan: 1. Exposure to the flu (Primary) - oseltamivir (TAMIFLU) 75 MG capsule; Take 1 capsule (75 mg total) by mouth 2 (two) times daily.  Dispense: 10 capsule; Refill: 0 - benzonatate (TESSALON) 200 MG capsule; Take 1 capsule (200 mg total) by mouth 2 (two) times daily as needed for cough.  Dispense: 20 capsule; Refill: 0  2. Flu-like symptoms - oseltamivir (TAMIFLU) 75 MG capsule; Take 1 capsule (75 mg total) by mouth 2 (two) times daily.  Dispense: 10 capsule; Refill: 0 - benzonatate (TESSALON) 200 MG capsule; Take 1 capsule  (200 mg total) by mouth 2 (two) times daily as needed for cough.  Dispense: 20 capsule; Refill: 0  Rest Force fluids Tamiflu today Tessalon as needed Droplet precautions  Follow up if symptoms worsen or do not improve   Follow Up Instructions: I discussed the assessment and treatment plan with the patient. The patient was provided an opportunity to ask questions and all were answered. The patient agreed with the plan and demonstrated an understanding of the instructions.  A copy of instructions were sent to the patient via MyChart unless otherwise noted below.     The patient was advised to call back or seek an in-person evaluation if the symptoms worsen or if the condition fails to improve as anticipated.    Jannifer Rodney, FNP

## 2024-01-30 DIAGNOSIS — F4310 Post-traumatic stress disorder, unspecified: Secondary | ICD-10-CM | POA: Diagnosis not present

## 2024-02-04 ENCOUNTER — Telehealth: Payer: Self-pay | Admitting: Oncology

## 2024-02-04 DIAGNOSIS — M25561 Pain in right knee: Secondary | ICD-10-CM | POA: Diagnosis not present

## 2024-02-04 NOTE — Telephone Encounter (Signed)
Patient called to r/s missed B12 appointment from 2/10. Rescheduled requested appointment and sent message to team scheduler to adjust future appointments.

## 2024-02-05 ENCOUNTER — Inpatient Hospital Stay: Payer: Medicare Other

## 2024-02-05 DIAGNOSIS — D519 Vitamin B12 deficiency anemia, unspecified: Secondary | ICD-10-CM | POA: Diagnosis not present

## 2024-02-05 DIAGNOSIS — D509 Iron deficiency anemia, unspecified: Secondary | ICD-10-CM

## 2024-02-05 MED ORDER — CYANOCOBALAMIN 1000 MCG/ML IJ SOLN
1000.0000 ug | INTRAMUSCULAR | Status: AC
  Administered 2024-02-05: 1000 ug via INTRAMUSCULAR
  Filled 2024-02-05: qty 1

## 2024-02-13 ENCOUNTER — Other Ambulatory Visit: Payer: Self-pay | Admitting: Orthopedic Surgery

## 2024-02-13 DIAGNOSIS — M25561 Pain in right knee: Secondary | ICD-10-CM

## 2024-02-15 ENCOUNTER — Ambulatory Visit
Admission: RE | Admit: 2024-02-15 | Discharge: 2024-02-15 | Disposition: A | Discharge status: Home / Self Care | Payer: Medicare Other | Source: Ambulatory Visit | Attending: Orthopedic Surgery | Admitting: Orthopedic Surgery

## 2024-02-15 DIAGNOSIS — M25561 Pain in right knee: Secondary | ICD-10-CM

## 2024-02-15 DIAGNOSIS — S83241A Other tear of medial meniscus, current injury, right knee, initial encounter: Secondary | ICD-10-CM | POA: Diagnosis not present

## 2024-02-24 ENCOUNTER — Inpatient Hospital Stay: Payer: Medicare Other

## 2024-02-27 ENCOUNTER — Encounter: Payer: Self-pay | Admitting: Family Medicine

## 2024-02-27 DIAGNOSIS — E1169 Type 2 diabetes mellitus with other specified complication: Secondary | ICD-10-CM

## 2024-02-27 MED ORDER — MOUNJARO 2.5 MG/0.5ML ~~LOC~~ SOAJ: 2.5000 mg | SUBCUTANEOUS | 2 refills | Status: DC

## 2024-02-28 ENCOUNTER — Other Ambulatory Visit: Payer: Self-pay | Admitting: Family Medicine

## 2024-02-28 DIAGNOSIS — J309 Allergic rhinitis, unspecified: Secondary | ICD-10-CM

## 2024-02-28 NOTE — Telephone Encounter (Signed)
 Requested Prescriptions  Pending Prescriptions Disp Refills   fluticasone (FLONASE) 50 MCG/ACT nasal spray [Pharmacy Med Name: FLUTICASONE NASAL SP (120) RX] 48 g 0    Sig: SHAKE LIQUID AND USE 2 SPRAYS IN EACH NOSTRIL EVERY DAY     Ear, Nose, and Throat: Nasal Preparations - Corticosteroids Passed - 02/28/2024  4:55 PM      Passed - Valid encounter within last 12 months    Recent Outpatient Visits           1 month ago Norovirus   [facility] [place] J, DO   3 months ago Simple chronic bronchitis [facility])   [place] [facility] [place], Netta Neat, DO   4 months ago Atopic dermatitis, unspecified type   [place] [facility], Netta Neat, DO   5 months ago Type 2 diabetes mellitus with other specified complication, without long-term current use of insulin [facility])   [place] [facility] Smitty Cords, DO   1 year ago Recurrent UTI   [facility] [place], Netta Neat, [place]

## 2024-03-04 ENCOUNTER — Inpatient Hospital Stay: Payer: Medicare Other | Attending: Oncology

## 2024-03-04 DIAGNOSIS — D519 Vitamin B12 deficiency anemia, unspecified: Secondary | ICD-10-CM | POA: Diagnosis not present

## 2024-03-04 DIAGNOSIS — D509 Iron deficiency anemia, unspecified: Secondary | ICD-10-CM

## 2024-03-04 MED ORDER — CYANOCOBALAMIN 1000 MCG/ML IJ SOLN
1000.0000 ug | INTRAMUSCULAR | Status: DC
  Administered 2024-03-04: 1000 ug via INTRAMUSCULAR
  Filled 2024-03-04: qty 1

## 2024-03-05 DIAGNOSIS — M1711 Unilateral primary osteoarthritis, right knee: Secondary | ICD-10-CM | POA: Diagnosis not present

## 2024-03-05 DIAGNOSIS — R937 Abnormal findings on diagnostic imaging of other parts of musculoskeletal system: Secondary | ICD-10-CM | POA: Diagnosis not present

## 2024-03-06 ENCOUNTER — Other Ambulatory Visit: Payer: Self-pay | Admitting: Family Medicine

## 2024-03-06 DIAGNOSIS — E039 Hypothyroidism, unspecified: Secondary | ICD-10-CM

## 2024-03-06 DIAGNOSIS — J309 Allergic rhinitis, unspecified: Secondary | ICD-10-CM

## 2024-03-09 NOTE — Telephone Encounter (Signed)
 Requested Prescriptions  Refused Prescriptions Disp Refills   levothyroxine (SYNTHROID) 50 MCG tablet [Pharmacy Med Name: LEVOTHYROXINE 0.05MG  ( ) TAB] 90 tablet 1    Sig: TAKE 1 TABLET(50 MCG) BY MOUTH DAILY BEFORE BREAKFAST     Endocrinology:  Hypothyroid Agents Failed - 03/09/2024  7:36 AM      Failed - TSH in normal range and within 360 days    TSH  Date Value Ref Range Status  11/08/2020 2.28 mIU/L Final    Comment:              Reference Range .           > or = 20 Years  0.40-4.50 .                Pregnancy Ranges           First trimester    0.26-2.66           Second trimester   0.55-2.73           Third trimester    0.43-2.91          Passed - Valid encounter within last 12 months    Recent Outpatient Visits           1 month ago Norovirus   [place] [facility] [place], Netta Neat, DO   3 months ago Simple chronic bronchitis [facility])   [place] [facility] [place], Netta Neat, DO   4 months ago Atopic dermatitis, unspecified type   [place] [facility] [place], Netta Neat, DO   6 months ago Type 2 diabetes mellitus with other specified complication, without long-term current use of insulin [facility])   [place] [facility] Smitty Cords, DO   1 year ago Recurrent UTI   [place] [facility], Netta Neat, DO               fluticasone [facility]) 50 MCG/ACT nasal spray [Pharmacy Med Name: FLUTICASONE NASAL SP (120) RX] 48 g 0    Sig: SHAKE LIQUID AND USE 2 SPRAYS IN EACH NOSTRIL EVERY DAY     Ear, Nose, and Throat: Nasal Preparations - Corticosteroids Passed - 03/09/2024  7:36 AM      Passed - Valid encounter within last 12 months    Recent Outpatient Visits           1 month ago Norovirus   [place] [facility], Netta Neat, DO   3 months ago Simple chronic bronchitis [facility])   [place] [facility] [place], Netta Neat, DO   4 months ago Atopic dermatitis, unspecified type   [place] [facility] [place], Netta Neat, DO   6 months ago Type 2 diabetes mellitus with other specified complication, without long-term current use of insulin [facility])   [place] [facility] Smitty Cords, DO   1 year ago Recurrent UTI   [facility] [place], Netta Neat, [place]

## 2024-03-12 DIAGNOSIS — H66001 Acute suppurative otitis media without spontaneous rupture of ear drum, right ear: Secondary | ICD-10-CM | POA: Diagnosis not present

## 2024-03-12 DIAGNOSIS — J3489 Other specified disorders of nose and nasal sinuses: Secondary | ICD-10-CM | POA: Diagnosis not present

## 2024-03-12 DIAGNOSIS — H9201 Otalgia, right ear: Secondary | ICD-10-CM | POA: Diagnosis not present

## 2024-03-16 ENCOUNTER — Telehealth (INDEPENDENT_AMBULATORY_CARE_PROVIDER_SITE_OTHER): Admitting: Family Medicine

## 2024-03-16 ENCOUNTER — Encounter: Payer: Self-pay | Admitting: Family Medicine

## 2024-03-16 DIAGNOSIS — H66001 Acute suppurative otitis media without spontaneous rupture of ear drum, right ear: Secondary | ICD-10-CM | POA: Diagnosis not present

## 2024-03-16 MED ORDER — AMOXICILLIN-POT CLAVULANATE 875-125 MG PO TABS: 1.0000 | ORAL_TABLET | Freq: Two times a day (BID) | ORAL | 0 refills | Status: DC

## 2024-03-16 NOTE — Progress Notes (Signed)
 Subjective:    Patient ID: Amanda Webb, female    DOB: 12-Mar-1971, 53 y.o.   MRN: [id number]  Amanda Webb is a 53 y.o. female presenting on 03/16/2024 for Otitis Media   Virtual / Telehealth Encounter - Video Visit via MyChart The purpose of this virtual visit is to provide medical care while limiting exposure to the novel coronavirus (COVID19) for both patient and office staff.  Consent was obtained for remote visit:  Yes.   Answered questions that patient had about telehealth interaction:  Yes.   I discussed the limitations, risks, security and privacy concerns of performing an evaluation and management service by video/telephone. I also discussed with the patient that there may be a patient responsible charge related to this service. The patient expressed understanding and agreed to proceed.  Patient Location: Home Provider Location: [facility] (Office)  Participants in virtual visit: - Patient: Amanda Webb - CMA: Fuller Plan CMA - Provider: Dr Althea Charon  Patient presents for a same day appointment.   HPI  Discussed the use of AI scribe software for clinical note transcription with the patient, who gave verbal consent to proceed.  History of Present Illness   Amanda Webb "Amanda Webb" is a 54 year old female who presents with persistent right ear pain and swelling of the lymph node.  Recently seen 4+ days ago at [facility] for this problem. She has persistent right ear pain described as stabbing and severe, despite completing a five-day course of amoxicillin 875 mg twice daily. The right lymph node remains swollen. Symptoms began a few days ago, initially diagnosed as a right ear infection. Her temperature has decreased to 52F today, down from a higher fever earlier in the illness.  She experiences ongoing nausea and loose stools, which she associates with antibiotic use. No improvement in ear pain despite treatment.  A couple of  months ago, she had the flu, followed by persistent congestion. She was tested for COVID-19 and the flu, both of which were negative. During her last evaluation, her eardrum was noted to be very pink with fluid behind it.  Additional update  She is currently taking Mounjaro (no longer on Ozempic), which has been effective in curbing her appetite without causing any adverse effects.         10/22/2023    8:22 AM 09/03/2023   10:54 AM 11/19/2022   10:50 AM  Depression screen PHQ 2/9  Decreased Interest 2 2 3   Down, Depressed, Hopeless 1 1 3   PHQ - 2 Score 3 3 6   Altered sleeping 0 0 3  Tired, decreased energy 1 2 3   Change in appetite 0 0 3  Feeling bad or failure about yourself  2 1 3   Trouble concentrating 0 0 2  Moving slowly or fidgety/restless 0 0 0  Suicidal thoughts 0 0 0  PHQ-9 Score 6 6 20   Difficult doing work/chores Somewhat difficult Very difficult Not difficult at all       10/22/2023    8:22 AM 09/03/2023   10:54 AM 11/19/2022   10:50 AM 10/19/2022    3:27 PM  GAD 7 : Generalized Anxiety Score  Nervous, Anxious, on Edge 2 2 3 3   Control/stop worrying 2 2 3 3   Worry too much - different things 2 2 3 3   Trouble relaxing 2 2 3 3   Restless 0 1 3 0  Easily annoyed or irritable 3 3 3 3   Afraid - awful  might happen 3 2 3 3   Total GAD 7 Score 14 14 21 18   Anxiety Difficulty   Somewhat difficult Somewhat difficult    Social History   Tobacco Use   Smoking status: Every Day    Current packs/day: 1.00    Average packs/day: 1 pack/day for 32.0 years (32.0 ttl pk-yrs)    Types: Cigarettes    Passive exposure: Current   Smokeless tobacco: Never   Tobacco comments:    1 ppd/ 11/22/2020  Vaping Use   Vaping status: Never Used  Substance Use Topics   Alcohol use: Yes    Alcohol/week: 3.0 standard drinks of alcohol    Types: 3 Glasses of wine per week   Drug use: No    Review of Systems Per HPI unless specifically indicated above     Objective:    There were  no vitals taken for this visit.  Wt Readings from Last 3 Encounters:  01/06/24 250 lb (113.4 kg)  10/22/23 241 lb (109.3 kg)  10/21/23 240 lb (108.9 kg)     Physical Exam  Note examination was completely remotely via video observation objective data only  Gen - well-appearing, no acute distress or apparent pain, comfortable HEENT - eyes appear clear without discharge or redness Heart/Lungs - cannot examine virtually - observed no evidence of coughing or labored breathing. Abd - cannot examine virtually  observed localized area of pain as demonstrated by patient is epigastric region Skin - face visible today- no rash Neuro - awake, alert, oriented Psych - not anxious appearing   Results for orders placed or performed in visit on 10/22/23  HIV Antibody (routine testing w rflx)   Collection Time: 10/22/23  9:01 AM  Result Value Ref Range   HIV 1&2 Ab, 4th Generation NON-REACTIVE NON-REACTIVE      Assessment & Plan:   Problem List Items Addressed This Visit   None Visit Diagnoses       Non-recurrent acute suppurative otitis media of right ear without spontaneous rupture of tympanic membrane    -  Primary   Relevant Medications   amoxicillin-clavulanate (AUGMENTIN) 875-125 MG tablet       Right ear infection Persistent right ear pain with stabbing sensation, swollen lymph node on the right side, and fluid behind the ear based on report from [facility] several days ago. Explained difficulty with virtual visit, cannot assess ear today  Initial treatment with amoxicillin 875 mg twice daily for five days has not resolved symptoms.  Differential diagnosis includes unresolved bacterial ear infection, viral infection, or sinus-related pressure/fluid behind the ear.   - Extend amoxicillin treatment for an additional five days, totaling ten days. - Advise follow-up if symptoms do not improve after the extended course, consider switching antibiotics. - Recommend in-person  follow-up if symptoms persist to reassess and confirm diagnosis.  Nausea and loose stools Nausea and loose stools possibly related to antibiotic use, as antibiotics can disrupt bowel function. Symptoms may also be related to underlying viral or sinus infection.  Continue Mounjaro as prescribed (not focus of this visit)    No orders of the defined types were placed in this encounter.   Meds ordered this encounter  Medications   amoxicillin-clavulanate (AUGMENTIN) 875-125 MG tablet    Sig: Take 1 tablet by mouth 2 (two) times daily. For 5 days extension    Dispense:  10 tablet    Refill:  0    Follow up plan: Return if symptoms worsen or fail to  improve.   Patient verbalizes understanding with the above medical recommendations including the limitation of remote medical advice.  Specific follow-up and call-back criteria were given for patient to follow-up or seek medical care more urgently if needed.  Total duration of direct patient care provided via video conference: 7 minutes   Saralyn Pilar, DO [facility] 03/16/2024, 4:14 PM

## 2024-03-16 NOTE — Patient Instructions (Addendum)
 Thank you for coming to the office today.  Add 5 day to the Augmentin course.   Otitis Media, Adult  Otitis media occurs when there is inflammation and fluid in the middle ear with signs and symptoms of an acute infection. The middle ear is a part of the ear that contains bones for hearing as well as air that helps send sounds to the brain. When infected fluid builds up in this space, it causes pressure and can lead to an ear infection. The eustachian tube connects the middle ear to the back of the nose (nasopharynx) and normally allows air into the middle ear. If the eustachian tube becomes blocked, fluid can build up and become infected. What are the causes? This condition is caused by a blockage in the eustachian tube. This can be caused by mucus or by swelling of the tube. Problems that can cause a blockage include: A cold or other upper respiratory infection. Allergies. An irritant, such as tobacco smoke. Enlarged adenoids. The adenoids are areas of soft tissue located high in the back of the throat, behind the nose and the roof of the mouth. They are part of the body's defense system (immune system). A mass in the nasopharynx. Damage to the ear caused by pressure changes (barotrauma). What increases the risk? You are more likely to develop this condition if you: Smoke or are exposed to tobacco smoke. Have an opening in the roof of your mouth (cleft palate). Have gastroesophageal reflux. Have an immune system disorder. What are the signs or symptoms? Symptoms of this condition include: Ear pain. Fever. Decreased hearing. Tiredness (lethargy). Fluid leaking from the ear, if the eardrum is ruptured or has burst. Ringing in the ear. How is this diagnosed?  This condition is diagnosed with a physical exam. During the exam, your health care provider will use an instrument called an otoscope to look in your ear and check for redness, swelling, and fluid. He or she will also ask about  your symptoms. Your health care provider may also order tests, such as: A pneumatic otoscopy. This is a test to check the movement of the eardrum. It is done by squeezing a small amount of air into the ear. A tympanogram. This is a test that shows how well the eardrum moves in response to air pressure in the ear canal. It provides a graph for your health care provider to review. How is this treated? This condition can go away on its own within 3-5 days. But if the condition is caused by a bacterial infection and does not go away on its own, or if it keeps coming back, your health care provider may: Prescribe antibiotic medicine to treat the infection. Prescribe or recommend medicines to control pain. Follow these instructions at home: Take over-the-counter and prescription medicines only as told by your health care provider. If you were prescribed an antibiotic medicine, take it as told by your health care provider. Do not stop taking the antibiotic even if you start to feel better. Keep all follow-up visits. This is important. Contact a health care provider if: You have bleeding from your nose. There is a lump on your neck. You are not feeling better in 5 days. You feel worse instead of better. Get help right away if: You have severe pain that is not controlled with medicine. You have swelling, redness, or pain around your ear. You have stiffness in your neck. A part of your face is not moving (paralyzed). The bone behind  your ear (mastoid bone) is tender when you touch it. You develop a severe headache. Summary Otitis media is redness, soreness, and swelling of the middle ear, usually resulting in pain and decreased hearing. This condition can go away on its own within 3-5 days. If the problem does not go away in 3-5 days, your health care provider may give you medicines to treat the infection. If you were prescribed an antibiotic medicine, take it as told by your health care  provider. Follow all instructions that were given to you by your health care provider. This information is not intended to replace advice given to you by your health care provider. Make sure you discuss any questions you have with your health care provider. Document Revised: 03/13/2021 Document Reviewed: 03/13/2021 Elsevier Patient Education  2024 [employer] Please schedule a Follow-up Appointment to: No follow-ups on file.  If you have any other questions or concerns, please feel free to call the office or send a message through MyChart. You may also schedule an earlier appointment if necessary.  Additionally, you may be receiving a survey about your experience at our office within a few days to 1 week by e-mail or mail. We value your feedback.  Saralyn Pilar, DO [facility] [place]

## 2024-03-23 ENCOUNTER — Encounter: Payer: Self-pay | Admitting: Family Medicine

## 2024-03-23 ENCOUNTER — Other Ambulatory Visit: Payer: Self-pay

## 2024-03-23 ENCOUNTER — Other Ambulatory Visit: Payer: Self-pay | Admitting: Family Medicine

## 2024-03-23 DIAGNOSIS — I10 Essential (primary) hypertension: Secondary | ICD-10-CM

## 2024-03-23 MED ORDER — ATENOLOL 50 MG PO TABS: ORAL_TABLET | ORAL | 0 refills | Status: DC

## 2024-03-26 ENCOUNTER — Inpatient Hospital Stay: Payer: Medicare Other

## 2024-03-27 ENCOUNTER — Encounter: Payer: Self-pay | Admitting: Family Medicine

## 2024-03-27 ENCOUNTER — Ambulatory Visit (INDEPENDENT_AMBULATORY_CARE_PROVIDER_SITE_OTHER): Admitting: Family Medicine

## 2024-03-27 VITALS — BP 132/78 | HR 79 | Ht 67.0 in | Wt 250.5 lb

## 2024-03-27 DIAGNOSIS — Z7985 Long-term (current) use of injectable non-insulin antidiabetic drugs: Secondary | ICD-10-CM | POA: Diagnosis not present

## 2024-03-27 DIAGNOSIS — J320 Chronic maxillary sinusitis: Secondary | ICD-10-CM

## 2024-03-27 DIAGNOSIS — J3089 Other allergic rhinitis: Secondary | ICD-10-CM

## 2024-03-27 DIAGNOSIS — N39490 Overflow incontinence: Secondary | ICD-10-CM

## 2024-03-27 DIAGNOSIS — E1169 Type 2 diabetes mellitus with other specified complication: Secondary | ICD-10-CM

## 2024-03-27 DIAGNOSIS — J0100 Acute maxillary sinusitis, unspecified: Secondary | ICD-10-CM

## 2024-03-27 DIAGNOSIS — N3941 Urge incontinence: Secondary | ICD-10-CM

## 2024-03-27 DIAGNOSIS — N3946 Mixed incontinence: Secondary | ICD-10-CM

## 2024-03-27 MED ORDER — MONTELUKAST SODIUM 10 MG PO TABS
10.0000 mg | ORAL_TABLET | Freq: Every day | ORAL | 1 refills | Status: AC
Start: 2024-03-27 — End: ?

## 2024-03-27 MED ORDER — AZELASTINE HCL 0.1 % NA SOLN: 1.0000 | Freq: Two times a day (BID) | NASAL | 5 refills | Status: AC

## 2024-03-27 MED ORDER — DOXYCYCLINE HYCLATE 100 MG PO TABS: 100.0000 mg | ORAL_TABLET | Freq: Two times a day (BID) | ORAL | 0 refills | Status: DC

## 2024-03-27 NOTE — Patient Instructions (Addendum)

## 2024-03-27 NOTE — Progress Notes (Addendum)
 Subjective:    Patient ID: Amanda Webb, female    DOB: 1971-11-13, 53 y.o.   MRN: [id number]  Amanda Webb is a 53 y.o. female presenting on 03/27/2024 for Ear Pain and Sinus Problem   HPI  Discussed the use of AI scribe software for clinical note transcription with the patient, who gave verbal consent to proceed.  History of Present Illness   Amanda Webb "Amanda Webb" is a 53 year old female who presents with persistent ear pain and facial swelling.  She has been experiencing persistent ear pain and facial swelling for approximately two weeks. The pain, although less severe than initially, remains present. The swelling is primarily on one side of her face, described as feeling puffy upon waking. She also notes a swollen lymph node and a sensation of fullness in the ear, which she associates with pressure pain. These symptoms have persisted despite completing a course of amoxicillin  and Augmentin , prescribed following an urgent care visit on March 12, 2024. No significant fever has been noted, with the last recorded temperature being 27F.  She is currently using a calcitonin nasal spray, which she started before the onset of her current symptoms, applying it to her left nostril only. She has facial redness, which her mother has also noticed, and questions whether the nasal spray could be contributing to her symptoms. She has not paused the nasal spray since starting it. She has a history of allergies, particularly to pollen, trees, and grass, and previously received allergy shots. She currently manages her allergies with Zyrtec, Flonase, and azelastine . She has not used Singulair  but is open to considering it. Her allergies are severe, which may be contributing to her current symptoms.  She mentions a history of frequent antibiotic use, including amoxicillin , due to past conditions such as pneumonia and dental issues. She expresses concern about potential antibiotic resistance due to frequent  use.  She is undergoing treatment for knee issues, including calcium supplementation and physical therapy, and is considering a knee replacement. She has not yet started physical therapy and is scheduled to follow up with her surgeon on Apr 16, 2024.   Type 2 Diabetes On Mounjaro  2.5mg  weekly, improved appetite suppression She will dose increase now after 4th shot to 5mg , she has med already  Doing well  Urge Urinary Incontinence / Mixed / Overflow incontinence Chronic problems followed GYN for urinary dysfunction, and history of prior Urology management GYN managing her on Oxybuytnin 5mg  XL nightly Also utilizing incontinence supplies with pull-ons         03/27/2024   10:21 AM 10/22/2023    8:22 AM 09/03/2023   10:54 AM  Depression screen PHQ 2/9  Decreased Interest 1 2 2   Down, Depressed, Hopeless 1 1 1   PHQ - 2 Score 2 3 3   Altered sleeping 2 0 0  Tired, decreased energy 2 1 2   Change in appetite 0 0 0  Feeling bad or failure about yourself  1 2 1   Trouble concentrating 1 0 0  Moving slowly or fidgety/restless 0 0 0  Suicidal thoughts 0 0 0  PHQ-9 Score 8 6 6   Difficult doing work/chores Not difficult at all Somewhat difficult Very difficult       03/27/2024   10:21 AM 10/22/2023    8:22 AM 09/03/2023   10:54 AM 11/19/2022   10:50 AM  GAD 7 : Generalized Anxiety Score  Nervous, Anxious, on Edge 1 2 2 3   Control/stop worrying 1 2 2  3  Worry too much - different things 1 2 2 3   Trouble relaxing 1 2 2 3   Restless 1 0 1 3  Easily annoyed or irritable 1 3 3 3   Afraid - awful might happen 1 3 2 3   Total GAD 7 Score 7 14 14 21   Anxiety Difficulty Not difficult at all   Somewhat difficult    Social History   Tobacco Use  . Smoking status: Every Day    Current packs/day: 1.00    Average packs/day: 1 pack/day for 32.0 years (32.0 ttl pk-yrs)    Types: Cigarettes    Passive exposure: Current  . Smokeless tobacco: Never  . Tobacco comments:    1 ppd/ 11/22/2020   Vaping Use  . Vaping status: Never Used  Substance Use Topics  . Alcohol use: Yes    Alcohol/week: 3.0 standard drinks of alcohol    Types: 3 Glasses of wine per week  . Drug use: No    Review of Systems Per HPI unless specifically indicated above     Objective:    BP 132/78 (BP Location: Right Arm, Patient Position: Sitting, Cuff Size: Large)   Pulse 79   Ht 5\' 7"  (1.702 m)   Wt 250 lb 8 oz (113.6 kg)   SpO2 98%   BMI 39.23 kg/m   Wt Readings from Last 3 Encounters:  03/30/24 252 lb 4.8 oz (114.4 kg)  03/27/24 250 lb 8 oz (113.6 kg)  01/06/24 250 lb (113.4 kg)    Physical Exam Vitals and nursing note reviewed.  Constitutional:      General: She is not in acute distress.    Appearance: Normal appearance. She is well-developed. She is obese. She is not diaphoretic.     Comments: Well-appearing, comfortable, cooperative  HENT:     Head: Normocephalic and atraumatic.     Right Ear: Tympanic membrane, ear canal and external ear normal. There is no impacted cerumen.     Left Ear: Tympanic membrane, ear canal and external ear normal. There is no impacted cerumen.     Ears:     Comments: No erythema of TM. Noted some R side effusion clear. Fullness but not bulging.    Nose: No congestion.     Mouth/Throat:     Mouth: Mucous membranes are moist.     Pharynx: No oropharyngeal exudate or posterior oropharyngeal erythema.  Eyes:     General:        Right eye: No discharge.        Left eye: No discharge.     Conjunctiva/sclera: Conjunctivae normal.  Cardiovascular:     Rate and Rhythm: Normal rate.  Pulmonary:     Effort: Pulmonary effort is normal.  Lymphadenopathy:     Cervical: No cervical adenopathy (No identified lymph node).  Skin:    General: Skin is warm and dry.     Findings: No erythema or rash.  Neurological:     Mental Status: She is alert and oriented to person, place, and time.  Psychiatric:        Mood and Affect: Mood normal.        Behavior:  Behavior normal.        Thought Content: Thought content normal.     Comments: Well groomed, good eye contact, normal speech and thoughts    Results for orders placed or performed in visit on 10/22/23  HIV Antibody (routine testing w rflx)   Collection Time: 10/22/23  9:01 AM  Result Value Ref Range   HIV 1&2 Ab, 4th Generation NON-REACTIVE NON-REACTIVE      Assessment & Plan:   Problem List Items Addressed This Visit     Allergic rhinitis   Relevant Medications   azelastine  (ASTELIN ) 0.1 % nasal spray   montelukast  (SINGULAIR ) 10 MG tablet   Chronic sinusitis - Primary   Relevant Medications   azelastine  (ASTELIN ) 0.1 % nasal spray   doxycycline  (VIBRA -TABS) 100 MG tablet   Type 2 diabetes mellitus with other specified complication (HCC)   Relevant Medications   MOUNJARO  5 MG/0.5ML Pen   Urinary incontinence, mixed   Other Visit Diagnoses       Acute non-recurrent maxillary sinusitis       Relevant Medications   azelastine  (ASTELIN ) 0.1 % nasal spray   doxycycline  (VIBRA -TABS) 100 MG tablet     Long-term current use of injectable noninsulin antidiabetic medication         Urge urinary incontinence         Overflow incontinence            Ear Pain with Sinus Involvement Ear pain and facial puffiness likely due to sinus inflammation, not bacterial infection. Frequent antibiotic Amoxicillin  Augmentin  use raises resistance concerns. No active ear infection on exam, but fluid and pressure present.   Suspect may have side effects from Calcitonin nasal spray may exacerbate symptoms. Allergies may contribute to sinus pressure and lymph node swelling.  - Pause calcitonin nasal spray for 2-3 days to assess symptom resolution. (Using this per Ortho)  - Prescribe doxycycline  as a backup antibiotic if symptoms worsen or persist. - Consider pseudoephedrine to reduce sinus pressure and swelling. - Continue allergy management with cetirizine, fluticasone, and azelastine . -  Prescribe montelukast  for additional allergy management. - Return to ENT if symptoms do not improve.  Knee Osteoarthritis Managing knee osteoarthritis with potential for knee replacement. Awaiting knee brace and has not started physical therapy. Treatment plan contingent on response over the next six weeks, with surgery as a potential option if symptoms do not improve. - Continue calcium supplementation and calcitonin nasal spray. - Initiate physical therapy as planned. - Follow up with orthopedic surgeon on May 1st to reassess treatment plan.  Type 2 Diabetes On Mounjaro  2.5mg  with significant improvement. Has successful appetite suppression Dose increase to 5mg  as indicated she already has this order - Monitor weight and appetite changes.      Urinary Incontinence, Urge, Mixed, Overflow Chronic problem Stable currently. Followed by GYN on medication Oxybutynin  XL 5mg  nightly Utilizing urinary incontinence supplies daily pull-ons to manage her symptoms as well to avoid complications of urinary incontinence  No orders of the defined types were placed in this encounter.   Meds ordered this encounter  Medications  . azelastine  (ASTELIN ) 0.1 % nasal spray    Sig: Place 1 spray into both nostrils 2 (two) times daily. Use in each nostril as directed    Dispense:  30 mL    Refill:  5  . montelukast  (SINGULAIR ) 10 MG tablet    Sig: Take 1 tablet (10 mg total) by mouth at bedtime.    Dispense:  90 tablet    Refill:  1  . doxycycline  (VIBRA -TABS) 100 MG tablet    Sig: Take 1 tablet (100 mg total) by mouth 2 (two) times daily. For 10 days. Take with full glass of water, stay upright 30 min after taking.    Dispense:  20 tablet    Refill:  0  Follow up plan: Return if symptoms worsen or fail to improve.  Domingo Friend, DO [facility] 03/27/2024, 10:35 AM

## 2024-03-30 ENCOUNTER — Encounter: Payer: Self-pay | Admitting: Obstetrics and Gynecology

## 2024-03-30 ENCOUNTER — Ambulatory Visit: Payer: Medicare Other

## 2024-03-30 VITALS — BP 146/84 | HR 78 | Wt 252.3 lb

## 2024-03-30 DIAGNOSIS — N3946 Mixed incontinence: Secondary | ICD-10-CM

## 2024-03-30 DIAGNOSIS — Z3042 Encounter for surveillance of injectable contraceptive: Secondary | ICD-10-CM | POA: Diagnosis not present

## 2024-03-30 MED ORDER — OXYBUTYNIN CHLORIDE ER 5 MG PO TB24: 5.0000 mg | ORAL_TABLET | Freq: Every day | ORAL | Status: DC

## 2024-03-30 MED ORDER — MEDROXYPROGESTERONE ACETATE 150 MG/ML IM SUSP
150.0000 mg | Freq: Once | INTRAMUSCULAR | Status: AC
  Administered 2024-03-30: 150 mg via INTRAMUSCULAR

## 2024-03-30 NOTE — Progress Notes (Signed)
    NURSE VISIT NOTE  Subjective:    Patient ID: Amanda Webb, female    DOB: 01-31-1971, 53 y.o.   MRN: [id number]  HPI  Patient is a 53 y.o. [id number] female who presents for depo provera injection.   Objective:    BP (!) 146/84   Pulse 78   Wt 252 lb 4.8 oz (114.4 kg)   BMI 39.52 kg/m   Last Annual: 02/27/2023. Last pap: 02/27/2023. Last Depo-Provera: 01/05/2023. Side Effects if any: none. Serum HCG indicated? No . Depo-Provera 150 mg IM given by: Doneen Fuelling, CMA. Site: Left Deltoid  Lab Review  @THIS  VISIT ONLY@  Assessment:   1. Urinary incontinence, mixed   2. Encounter for surveillance of injectable contraceptive      Plan:   Next appointment due between June 30 and July 14.    Doneen Fuelling, CMA [facility]

## 2024-03-30 NOTE — Patient Instructions (Signed)
 Medroxyprogesterone Injection (Contraception) What is this medication? MEDROXYPROGESTERONE (me DROX ee proe JES te rone) prevents ovulation and pregnancy. It belongs to a group of medications called contraceptives. This medication is a progestin hormone. This medicine may be used for other purposes; ask your health care provider or pharmacist if you have questions. COMMON BRAND NAME(S): Depo-Provera, Depo-subQ Provera 104 What should I tell my care team before I take this medication? They need to know if you have any of these conditions: Asthma Blood clots Breast cancer or family history of breast cancer Depression Diabetes Eating disorder (anorexia nervosa) Frequently drink alcohol Heart attack High blood pressure HIV infection or AIDS Kidney disease Liver disease Migraine headaches Osteoporosis, weak bones Seizures Stroke Tobacco use Vaginal bleeding An unusual or allergic reaction to medroxyprogesterone, other medications, foods, dyes, or preservatives Pregnant or trying to get pregnant Breast-feeding How should I use this medication? Depo-Provera CI contraceptive injection is given into a muscle. Depo-subQ Provera 104 injection is given under the skin. It is given in a hospital or clinic setting. The injection is usually given during the first 5 days after the start of a menstrual period or 6 weeks after delivery of a baby. A patient package insert for the product will be given with each prescription and refill. Be sure to read this information carefully each time. The sheet may change often. Talk to your care team about the use of this medication in children. Special care may be needed. These injections have been used in female children who have started having menstrual periods. Overdosage: If you think you have taken too much of this medicine contact a poison control center or emergency room at once. NOTE: This medicine is only for you. Do not share this medicine with  others. What if I miss a dose? Keep appointments for follow-up doses. You must get an injection once every 3 months. It is important not to miss your dose. Call your care team if you are unable to keep an appointment. What may interact with this medication? Antibiotics or medications for infections, especially rifampin and griseofulvin Antivirals for HIV or hepatitis Aprepitant Armodafinil Bexarotene Bosentan Medications for seizures, such as carbamazepine, felbamate, oxcarbazepine, phenytoin, phenobarbital, primidone, topiramate Mitotane Modafinil St. John's Wort This list may not describe all possible interactions. Give your health care provider a list of all the medicines, herbs, non-prescription drugs, or dietary supplements you use. Also tell them if you smoke, drink alcohol, or use illegal drugs. Some items may interact with your medicine. What should I watch for while using this medication? This medication does not protect you against HIV infection (AIDS) or other sexually transmitted diseases. Use of this product may cause you to lose calcium from your bones. Loss of calcium may cause weak bones (osteoporosis). Only use this product for more than 2 years if other forms of birth control are not right for you. The longer you use this product for birth control the more likely you will be at risk for weak bones. Ask your care team how you can keep strong bones. You may have a change in bleeding pattern or irregular periods. Many females stop having periods while taking this medication. If you have received your injections on time, your chance of being pregnant is very low. If you think you may be pregnant, see your care team as soon as possible. Tell your care team if you want to get pregnant within the next year. The effect of this medication may last a long time  after you get your last injection. What side effects may I notice from receiving this medication? Side effects that you should  report to your care team as soon as possible: Allergic reactions--skin rash, itching, hives, swelling of the face, lips, tongue, or throat Blood clot--pain, swelling, or warmth in the leg, shortness of breath, chest pain Gallbladder problems--severe stomach pain, nausea, vomiting, fever Increase in blood pressure Liver injury--right upper belly pain, loss of appetite, nausea, light-colored stool, dark yellow or brown urine, yellowing skin or eyes, unusual weakness or fatigue New or worsening migraines or headaches Seizures Stroke--sudden numbness or weakness of the face, arm, or leg, trouble speaking, confusion, trouble walking, loss of balance or coordination, dizziness, severe headache, change in vision Unusual vaginal discharge, itching, or odor Worsening mood, feelings of depression Side effects that usually do not require medical attention (report to your care team if they continue or are bothersome): Breast pain or tenderness Dark patches of the skin on the face or other sun-exposed areas Irregular menstrual cycles or spotting Nausea Weight gain This list may not describe all possible side effects. Call your doctor for medical advice about side effects. You may report side effects to FDA at 1-800-FDA-1088. Where should I keep my medication? This injection is only given by a care team. It will not be stored at home. NOTE: This sheet is a summary. It may not cover all possible information. If you have questions about this medicine, talk to your doctor, pharmacist, or health care provider.  2024 Elsevier/Gold Standard (2021-08-16 00:00:00)

## 2024-04-01 DIAGNOSIS — H16223 Keratoconjunctivitis sicca, not specified as Sjogren's, bilateral: Secondary | ICD-10-CM | POA: Diagnosis not present

## 2024-04-01 DIAGNOSIS — E119 Type 2 diabetes mellitus without complications: Secondary | ICD-10-CM | POA: Diagnosis not present

## 2024-04-01 DIAGNOSIS — H40053 Ocular hypertension, bilateral: Secondary | ICD-10-CM | POA: Diagnosis not present

## 2024-04-01 DIAGNOSIS — I10 Essential (primary) hypertension: Secondary | ICD-10-CM | POA: Diagnosis not present

## 2024-04-02 ENCOUNTER — Other Ambulatory Visit: Payer: Self-pay

## 2024-04-02 DIAGNOSIS — N3946 Mixed incontinence: Secondary | ICD-10-CM

## 2024-04-02 MED ORDER — OXYBUTYNIN CHLORIDE ER 5 MG PO TB24: 5.0000 mg | ORAL_TABLET | Freq: Every day | ORAL | 0 refills | Status: AC

## 2024-04-03 ENCOUNTER — Inpatient Hospital Stay: Payer: Medicare Other | Attending: Oncology

## 2024-04-03 DIAGNOSIS — D519 Vitamin B12 deficiency anemia, unspecified: Secondary | ICD-10-CM | POA: Insufficient documentation

## 2024-04-03 DIAGNOSIS — D509 Iron deficiency anemia, unspecified: Secondary | ICD-10-CM

## 2024-04-03 MED ORDER — CYANOCOBALAMIN 1000 MCG/ML IJ SOLN
1000.0000 ug | INTRAMUSCULAR | Status: DC
  Administered 2024-04-03: 1000 ug via INTRAMUSCULAR
  Filled 2024-04-03: qty 1

## 2024-04-06 ENCOUNTER — Encounter: Payer: Self-pay | Admitting: Family Medicine

## 2024-04-06 ENCOUNTER — Other Ambulatory Visit: Payer: Self-pay

## 2024-04-06 ENCOUNTER — Other Ambulatory Visit: Payer: Self-pay | Admitting: Family Medicine

## 2024-04-06 DIAGNOSIS — I10 Essential (primary) hypertension: Secondary | ICD-10-CM

## 2024-04-06 DIAGNOSIS — K219 Gastro-esophageal reflux disease without esophagitis: Secondary | ICD-10-CM

## 2024-04-06 MED ORDER — PANTOPRAZOLE SODIUM 20 MG PO TBEC: DELAYED_RELEASE_TABLET | ORAL | 1 refills | Status: DC

## 2024-04-07 DIAGNOSIS — G8929 Other chronic pain: Secondary | ICD-10-CM | POA: Diagnosis not present

## 2024-04-07 DIAGNOSIS — M25561 Pain in right knee: Secondary | ICD-10-CM | POA: Diagnosis not present

## 2024-04-07 NOTE — Telephone Encounter (Signed)
 Requested by interface surescripts. Last refill 03/23/24 #90 0 refills. Requested too soon. Requested Prescriptions  Refused Prescriptions Disp Refills   atenolol  (TENORMIN ) 50 MG tablet [Pharmacy Med Name: ATENOLOL  50MG  TABLETS] 90 tablet 0    Sig: TAKE 1 TABLET(50 MG) BY MOUTH DAILY     Cardiovascular: Beta Blockers 2 Failed - 04/07/2024  1:26 PM      Failed - Cr in normal range and within 360 days    Creat  Date Value Ref Range Status  11/08/2020 0.64 0.50 - 1.10 mg/dL Final   Creatinine, Ser  Date Value Ref Range Status  02/27/2023 0.62 0.57 - 1.00 mg/dL Final   Creatinine, Urine  Date Value Ref Range Status  10/19/2022 24 20 - 275 mg/dL Final         Failed - Last BP in normal range    BP Readings from Last 1 Encounters:  03/30/24 (!) 146/84         Failed - Valid encounter within last 6 months    Recent Outpatient Visits           1 week ago Chronic maxillary sinusitis   [facility] [facility] Raina Bunting, DO   3 weeks ago Non-recurrent acute suppurative otitis media of right ear without spontaneous rupture of tympanic membrane   [place] [facility] Raina Bunting, DO              Passed - Last Heart Rate in normal range    Pulse Readings from Last 1 Encounters:  03/30/24 78

## 2024-04-12 ENCOUNTER — Other Ambulatory Visit: Payer: Self-pay | Admitting: Family Medicine

## 2024-04-12 DIAGNOSIS — I10 Essential (primary) hypertension: Secondary | ICD-10-CM

## 2024-04-13 DIAGNOSIS — M6281 Muscle weakness (generalized): Secondary | ICD-10-CM | POA: Diagnosis not present

## 2024-04-13 DIAGNOSIS — G8929 Other chronic pain: Secondary | ICD-10-CM | POA: Diagnosis not present

## 2024-04-13 DIAGNOSIS — M25661 Stiffness of right knee, not elsewhere classified: Secondary | ICD-10-CM | POA: Diagnosis not present

## 2024-04-13 DIAGNOSIS — M25561 Pain in right knee: Secondary | ICD-10-CM | POA: Diagnosis not present

## 2024-04-14 NOTE — Telephone Encounter (Signed)
 Refill requested too soon, last ordered 03/23/24, #90, 0 refills. Requested Prescriptions  Pending Prescriptions Disp Refills   atenolol  (TENORMIN ) 50 MG tablet [Pharmacy Med Name: ATENOLOL  50MG  TABLETS] 90 tablet 0    Sig: TAKE 1 TABLET(50 MG) BY MOUTH DAILY     Cardiovascular: Beta Blockers 2 Failed - 04/14/2024 12:37 PM      Failed - Cr in normal range and within 360 days    Creat  Date Value Ref Range Status  11/08/2020 0.64 0.50 - 1.10 mg/dL Final   Creatinine, Ser  Date Value Ref Range Status  02/27/2023 0.62 0.57 - 1.00 mg/dL Final   Creatinine, Urine  Date Value Ref Range Status  10/19/2022 24 20 - 275 mg/dL Final         Failed - Last BP in normal range    BP Readings from Last 1 Encounters:  03/30/24 (!) 146/84         Failed - Valid encounter within last 6 months    Recent Outpatient Visits           2 weeks ago Chronic maxillary sinusitis   [facility] Raina Bunting, DO   4 weeks ago Non-recurrent acute suppurative otitis media of right ear without spontaneous rupture of tympanic membrane   [place] [facility] Raina Bunting, DO              Passed - Last Heart Rate in normal range    Pulse Readings from Last 1 Encounters:  03/30/24 78

## 2024-04-23 DIAGNOSIS — F4310 Post-traumatic stress disorder, unspecified: Secondary | ICD-10-CM | POA: Diagnosis not present

## 2024-04-24 ENCOUNTER — Inpatient Hospital Stay: Payer: Medicare Other

## 2024-04-30 DIAGNOSIS — M6281 Muscle weakness (generalized): Secondary | ICD-10-CM | POA: Diagnosis not present

## 2024-04-30 DIAGNOSIS — M25561 Pain in right knee: Secondary | ICD-10-CM | POA: Diagnosis not present

## 2024-04-30 DIAGNOSIS — M25661 Stiffness of right knee, not elsewhere classified: Secondary | ICD-10-CM | POA: Diagnosis not present

## 2024-04-30 DIAGNOSIS — G8929 Other chronic pain: Secondary | ICD-10-CM | POA: Diagnosis not present

## 2024-05-02 ENCOUNTER — Encounter

## 2024-05-02 ENCOUNTER — Telehealth: Admitting: Nurse Practitioner

## 2024-05-02 DIAGNOSIS — T3695XA Adverse effect of unspecified systemic antibiotic, initial encounter: Secondary | ICD-10-CM

## 2024-05-02 DIAGNOSIS — B379 Candidiasis, unspecified: Secondary | ICD-10-CM | POA: Diagnosis not present

## 2024-05-02 DIAGNOSIS — R399 Unspecified symptoms and signs involving the genitourinary system: Secondary | ICD-10-CM

## 2024-05-02 MED ORDER — FLUCONAZOLE 150 MG PO TABS: 150.0000 mg | ORAL_TABLET | Freq: Every day | ORAL | 0 refills | Status: DC

## 2024-05-02 MED ORDER — NITROFURANTOIN MONOHYD MACRO 100 MG PO CAPS: 100.0000 mg | ORAL_CAPSULE | Freq: Two times a day (BID) | ORAL | 0 refills | Status: AC

## 2024-05-02 NOTE — Patient Instructions (Signed)
 Amanda Webb, thank you for joining Collins Dean, NP for today's virtual visit.  While this provider is not your primary care provider (PCP), if your PCP is located in our provider database this encounter information will be shared with them immediately following your visit.   A [facility] MyChart account gives you access to today's visit and all your visits, tests, and labs performed at [facility] " click here if you don't have a  MyChart account or go to [email or web address]  Consent: (Patient) Amanda Webb provided verbal consent for this virtual visit at the beginning of the encounter.  Current Medications:  Current Outpatient Medications:    fluconazole  (DIFLUCAN ) 150 MG tablet, Take 1 tablet (150 mg total) by mouth daily., Disp: 1 tablet, Rfl: 0   nitrofurantoin, macrocrystal-monohydrate, (MACROBID) 100 MG capsule, Take 1 capsule (100 mg total) by mouth 2 (two) times daily for 5 days., Disp: 10 capsule, Rfl: 0   Accu-Chek FastClix Lancets MISC, USE TO CHECK BLOOD SUGAR UP TO TWICE DAILY AS DIRECTED, Disp: 102 each, Rfl: 5   albuterol  (PROVENTIL ) (2.5 MG/3ML) 0.083% nebulizer solution, Inhale 3ml (one vial) via nebulized route every four hours around the clock for 24 hours, followed by one vial every 6 hours for the next 24 hours. After 48 hours, take 3mL via neb every 6-8 hours PRN cough, wheeze, Disp: 150 mL, Rfl: 1   albuterol  (VENTOLIN  HFA) 108 (90 Base) MCG/ACT inhaler, INHALE 1 TO 2 PUFFS BY MOUTH EVERY 4 TO 6 HOURS AS NEEDED FOR SHORTNESS OF BREATH, Disp: 18 g, Rfl: 2   albuterol  (VENTOLIN  HFA) 108 (90 Base) MCG/ACT inhaler, Inhale 2 puffs into the lungs every 6 (six) hours as needed for wheezing or shortness of breath., Disp: 8 g, Rfl: 0   ARIPiprazole (ABILIFY) 2 MG tablet, Take 2 mg by mouth every morning. , Disp: , Rfl:    atenolol  (TENORMIN ) 50 MG tablet, TAKE 1 TABLET(50 MG) BY MOUTH DAILY, Disp: 90 tablet, Rfl: 0   azelastine  (ASTELIN ) 0.1 %  nasal spray, Place 1 spray into both nostrils 2 (two) times daily. Use in each nostril as directed, Disp: 30 mL, Rfl: 5   Cetirizine HCl 10 MG CAPS, Take 10 mg by mouth daily with lunch. , Disp: , Rfl:    Cyanocobalamin  (B-12 COMPLIANCE INJECTION IJ), Inject 1 Dose as directed every 30 (thirty) days., Disp: , Rfl:    doxycycline  (VIBRA -TABS) 100 MG tablet, Take 1 tablet (100 mg total) by mouth 2 (two) times daily. For 10 days. Take with full glass of water, stay upright 30 min after taking., Disp: 20 tablet, Rfl: 0   fluticasone (FLONASE) 50 MCG/ACT nasal spray, SHAKE LIQUID AND USE 2 SPRAYS IN EACH NOSTRIL EVERY DAY, Disp: 48 g, Rfl: 0   fluvoxaMINE (LUVOX) 100 MG tablet, Take 150 mg by mouth every morning. , Disp: , Rfl:    gabapentin  (NEURONTIN ) 100 MG capsule, Take by mouth., Disp: , Rfl:    glucose blood (ACCU-CHEK GUIDE) test strip, CHECK BLOOD SUGAR UP TO EVERY DAY AS DIRECTED, Disp: 200 strip, Rfl: 5   hydrOXYzine  (VISTARIL ) 25 MG capsule, TAKE 1 CAPSULE(25 MG) BY MOUTH EVERY 8 HOURS AS NEEDED, Disp: 30 capsule, Rfl: 0   ketoconazole  (NIZORAL ) 2 % shampoo, APPLY EXTERNALLY 2 TIMES A WEEK, Disp: 120 mL, Rfl: 2   lamoTRIgine (LAMICTAL) 200 MG tablet, Take by mouth., Disp: , Rfl:    levothyroxine  (SYNTHROID ) 50 MCG tablet, Take 1 tablet (50 mcg total)  by mouth daily before breakfast., Disp: 90 tablet, Rfl: 1   montelukast  (SINGULAIR ) 10 MG tablet, Take 1 tablet (10 mg total) by mouth at bedtime., Disp: 90 tablet, Rfl: 1   MOUNJARO  5 MG/0.5ML Pen, Inject 5 mg into the skin once a week., Disp: , Rfl:    Multiple Vitamin (MULTIVITAMIN WITH MINERALS) TABS tablet, Take 1 tablet by mouth daily., Disp: , Rfl:    nystatin  cream (MYCOSTATIN ), Apply 1 application. topically 2 (two) times daily as needed (rash)., Disp: 30 g, Rfl: 5   oxybutynin  (DITROPAN  XL) 5 MG 24 hr tablet, Take 1 tablet (5 mg total) by mouth at bedtime., Disp: 90 tablet, Rfl: 0   pantoprazole  (PROTONIX ) 20 MG tablet, TAKE 1 TABLET(20  MG) BY MOUTH DAILY, Disp: 90 tablet, Rfl: 1   TRELEGY ELLIPTA  100-62.5-25 MCG/ACT AEPB, INHALE 1 PUFF INTO THE LUNGS DAILY, Disp: 180 each, Rfl: 1   tretinoin  (RETIN-A ) 0.025 % cream, APPLY EXTERNALLY TO THE AFFECTED AREA AT BEDTIME AS NEEDED, Disp: 45 g, Rfl: 2 No current facility-administered medications for this visit.  Facility-Administered Medications Ordered in Other Visits:    cyanocobalamin  (VITAMIN B12) injection 1,000 mcg, 1,000 mcg, Intramuscular, Once, Avonne Boettcher, MD   cyanocobalamin  (VITAMIN B12) injection 1,000 mcg, 1,000 mcg, Intramuscular, Q30 days, Avonne Boettcher, MD, 1,000 mcg at 02/05/24 0909   Medications ordered in this encounter:  Meds ordered this encounter  Medications   fluconazole  (DIFLUCAN ) 150 MG tablet    Sig: Take 1 tablet (150 mg total) by mouth daily.    Dispense:  1 tablet    Refill:  0    Supervising Provider:   Corine Dice [[id number]]   nitrofurantoin, macrocrystal-monohydrate, (MACROBID) 100 MG capsule    Sig: Take 1 capsule (100 mg total) by mouth 2 (two) times daily for 5 days.    Dispense:  10 capsule    Refill:  0    Supervising Provider:   Corine Dice [[id number]]     *If you need refills on other medications prior to your next appointment, please contact your pharmacy*  Follow-Up: Call back or seek an in-person evaluation if the symptoms worsen or if the condition fails to improve as anticipated.  [facility] [phone]   If you have been instructed to have an in-person evaluation today at a local Urgent Care facility, please use the link below. It will take you to a list of all of our available [facility], including address, phone number and hours of operation. Please do not delay care.  [facility]  If you or a family member do not have a primary care provider, use the link below to schedule a visit and establish care. When you choose a [facility] primary care physician or advanced  practice provider, you gain a long-term partner in health. Find a Primary Care Provider  Learn more about [facility]'s in-office and virtual care options:  - Get Care Now

## 2024-05-02 NOTE — Progress Notes (Signed)
 Virtual Visit Consent   [name], you are scheduled for a virtual visit with a [facility] provider today. Just as with appointments in the office, your consent must be obtained to participate. Your consent will be active for this visit and any virtual visit you may have with one of our providers in the next 365 days. If you have a MyChart account, a copy of this consent can be sent to you electronically.  As this is a virtual visit, video technology does not allow for your provider to perform a traditional examination. This may limit your provider's ability to fully assess your condition. If your provider identifies any concerns that need to be evaluated in person or the need to arrange testing (such as labs, EKG, etc.), we will make arrangements to do so. Although advances in technology are sophisticated, we cannot ensure that it will always work on either your end or our end. If the connection with a video visit is poor, the visit may have to be switched to a telephone visit. With either a video or telephone visit, we are not always able to ensure that we have a secure connection.  By engaging in this virtual visit, you consent to the provision of healthcare and authorize for your insurance to be billed (if applicable) for the services provided during this visit. Depending on your insurance coverage, you may receive a charge related to this service.  I need to obtain your verbal consent now. Are you willing to proceed with your visit today? Amanda Webb has provided verbal consent on 05/02/2024 for a virtual visit (video or telephone). Amanda Dean, NP  Date: 05/02/2024 9:21 AM   Virtual Visit via Video Note   I, Amanda Webb, connected with  Amanda Webb  ([id number], 02-27-1971) on 05/02/24 at  9:15 AM EDT by a video-enabled telemedicine application and verified that I am speaking with the correct person using two identifiers.  Location: Patient: Virtual Visit Location Patient:  Home Provider: Virtual Visit Location Provider: Home Office   I discussed the limitations of evaluation and management by telemedicine and the availability of in person appointments. The patient expressed understanding and agreed to proceed.    History of Present Illness: Amanda Webb is a 53 y.o. who identifies as a female who was assigned female at birth, and is being seen today for UTI symptoms. .  Over the past several days Amanda Webb has been experiencing symptoms of Dysuria, urinary frequency, and now hematuria. Does not endorse fever, flank pain or N/V  Problems:  Patient Active Problem List   Diagnosis Date Noted   Major depressive disorder, recurrent, in partial remission (HCC) 11/08/2020   Cough 10/21/2020   Status post laparoscopic cholecystectomy 07/28/2020   Morbid obesity (HCC) 07/05/2020   Hepatic steatosis 06/23/2020   Lymphedema 03/09/2020   Chronic venous insufficiency 03/09/2020   Bilateral lower extremity edema 02/16/2020   Female cystocele 10/21/2018   Urinary incontinence, mixed 10/21/2018   B12 deficiency 04/08/2018   Iron  deficiency anemia 03/14/2018   GAD (generalized anxiety disorder) 03/10/2018   Acute right-sided low back pain 10/28/2017   Tobacco abuse 05/09/2017   Multiple thyroid  nodules 12/28/2016   Simple chronic bronchitis (HCC) 12/13/2016   BMI 40.0-44.9, adult (HCC) 09/26/2016   HLD (hyperlipidemia) 09/26/2016   Vitamin D deficiency 09/26/2016   Type 2 diabetes mellitus with other specified complication (HCC) 06/17/2015   Candidiasis of skin and nail 02/21/2015   Candidal intertrigo 02/21/2015  Cellulitis of right lower extremity 02/18/2015   Benign cyst of right kidney 01/17/2015   Chronic sinusitis 11/15/2014   Whiplash injury 09/03/2014   Heart palpitations 04/02/2014   Palpitations 04/02/2014   Allergic rhinitis 12/31/2013   Spondylosis of lumbar region without myelopathy or radiculopathy 12/28/2013   Chronic low back pain  09/28/2013   Other fatigue 09/28/2013   Hypothyroidism 09/28/2013   Alteration of body temperature 09/28/2013   Other fatigue 09/28/2013   Plantar fasciitis, bilateral 07/10/2013   Tarsal tunnel syndrome of right side 07/10/2013   Plantar fascial fibromatosis 07/10/2013   Encounter for surveillance of injectable contraceptive 05/13/2013   Diuresis excessive 03/24/2013   Polyuria 03/24/2013   Abnormal uterine and vaginal bleeding, unspecified 03/24/2013   Arthritis, multiple joint involvement 03/17/2013   Fibroblastic disorder 07/28/2012   Borderline personality disorder (HCC) 12/17/2010   Borderline personality disorder in adult [facility]) 12/17/2010   Essential hypertension 02/18/2010   Carpal tunnel syndrome on both sides 12/17/2006   Major depressive disorder, single episode 02/21/1998   Major depressive disorder, single episode 02/21/1998   Disk prolapse 04/17/1995   Depression with anxiety 03/18/1995   OCD (obsessive compulsive disorder) 03/18/1995   Severe anxiety with panic 03/18/1995    Allergies:  Allergies  Allergen Reactions   Bee Venom Anaphylaxis, Hives and Swelling    Carries Epi pen.    Cat Dander Itching, Other (See Comments) and Swelling    Allergic to trees, nuts, wheat, grass, cats & dogs - itchy watery eyes, swelling. Uses Zyrtec & Flonase & Benadryl if really bad. Used to get allergy shots. Allergic to trees, nuts, wheat, grass, cats & dogs - itchy watery eyes, swelling. Uses Zyrtec & Flonase & Benadryl if really bad. Used to get allergy shots.   Dog Epithelium Cough and Shortness Of Breath   Dog Epithelium (Canis Lupus Familiaris) Shortness Of Breath   Dog Fennel Cough and Shortness Of Breath   Dog Fennel Allergy Skin Test Shortness Of Breath   Dust Mite Extract Cough and Shortness Of Breath   Tetracyclines & Related Nausea And Vomiting   Lactose Diarrhea and Nausea And Vomiting   Milk (Cow) Diarrhea and Nausea And Vomiting   Milk Protein Diarrhea and Nausea  And Vomiting   Milk-Related Compounds Diarrhea and Nausea And Vomiting   Tape Other (See Comments) and Rash    Needs to use paper tape. Breaks out with severe rash, pulls skin off when using adhesive.   Wound Dressing Adhesive Rash    Other reaction(s): Other Needs to use paper tape. Breaks out with severe rash, pulls skin off when using adhesive.  Needs to use paper tape. Breaks out with severe rash, pulls skin off when using adhesive. Needs to use paper tape. Breaks out with severe rash, pulls skin off when using adhesive.   Medications:  Current Outpatient Medications:    fluconazole  (DIFLUCAN ) 150 MG tablet, Take 1 tablet (150 mg total) by mouth daily., Disp: 1 tablet, Rfl: 0   nitrofurantoin, macrocrystal-monohydrate, (MACROBID) 100 MG capsule, Take 1 capsule (100 mg total) by mouth 2 (two) times daily for 5 days., Disp: 10 capsule, Rfl: 0   Accu-Chek FastClix Lancets MISC, USE TO CHECK BLOOD SUGAR UP TO TWICE DAILY AS DIRECTED, Disp: 102 each, Rfl: 5   albuterol  (PROVENTIL ) (2.5 MG/3ML) 0.083% nebulizer solution, Inhale 3ml (one vial) via nebulized route every four hours around the clock for 24 hours, followed by one vial every 6 hours for the next 24 hours. After 48  hours, take 3mL via neb every 6-8 hours PRN cough, wheeze, Disp: 150 mL, Rfl: 1   albuterol  (VENTOLIN  HFA) 108 (90 Base) MCG/ACT inhaler, INHALE 1 TO 2 PUFFS BY MOUTH EVERY 4 TO 6 HOURS AS NEEDED FOR SHORTNESS OF BREATH, Disp: 18 g, Rfl: 2   albuterol  (VENTOLIN  HFA) 108 (90 Base) MCG/ACT inhaler, Inhale 2 puffs into the lungs every 6 (six) hours as needed for wheezing or shortness of breath., Disp: 8 g, Rfl: 0   ARIPiprazole (ABILIFY) 2 MG tablet, Take 2 mg by mouth every morning. , Disp: , Rfl:    atenolol  (TENORMIN ) 50 MG tablet, TAKE 1 TABLET(50 MG) BY MOUTH DAILY, Disp: 90 tablet, Rfl: 0   azelastine  (ASTELIN ) 0.1 % nasal spray, Place 1 spray into both nostrils 2 (two) times daily. Use in each nostril as directed, Disp: 30  mL, Rfl: 5   Cetirizine HCl 10 MG CAPS, Take 10 mg by mouth daily with lunch. , Disp: , Rfl:    Cyanocobalamin  (B-12 COMPLIANCE INJECTION IJ), Inject 1 Dose as directed every 30 (thirty) days., Disp: , Rfl:    doxycycline  (VIBRA -TABS) 100 MG tablet, Take 1 tablet (100 mg total) by mouth 2 (two) times daily. For 10 days. Take with full glass of water, stay upright 30 min after taking., Disp: 20 tablet, Rfl: 0   fluticasone (FLONASE) 50 MCG/ACT nasal spray, SHAKE LIQUID AND USE 2 SPRAYS IN EACH NOSTRIL EVERY DAY, Disp: 48 g, Rfl: 0   fluvoxaMINE (LUVOX) 100 MG tablet, Take 150 mg by mouth every morning. , Disp: , Rfl:    gabapentin  (NEURONTIN ) 100 MG capsule, Take by mouth., Disp: , Rfl:    glucose blood (ACCU-CHEK GUIDE) test strip, CHECK BLOOD SUGAR UP TO EVERY DAY AS DIRECTED, Disp: 200 strip, Rfl: 5   hydrOXYzine  (VISTARIL ) 25 MG capsule, TAKE 1 CAPSULE(25 MG) BY MOUTH EVERY 8 HOURS AS NEEDED, Disp: 30 capsule, Rfl: 0   ketoconazole  (NIZORAL ) 2 % shampoo, APPLY EXTERNALLY 2 TIMES A WEEK, Disp: 120 mL, Rfl: 2   lamoTRIgine (LAMICTAL) 200 MG tablet, Take by mouth., Disp: , Rfl:    levothyroxine  (SYNTHROID ) 50 MCG tablet, Take 1 tablet (50 mcg total) by mouth daily before breakfast., Disp: 90 tablet, Rfl: 1   montelukast  (SINGULAIR ) 10 MG tablet, Take 1 tablet (10 mg total) by mouth at bedtime., Disp: 90 tablet, Rfl: 1   MOUNJARO  5 MG/0.5ML Pen, Inject 5 mg into the skin once a week., Disp: , Rfl:    Multiple Vitamin (MULTIVITAMIN WITH MINERALS) TABS tablet, Take 1 tablet by mouth daily., Disp: , Rfl:    nystatin  cream (MYCOSTATIN ), Apply 1 application. topically 2 (two) times daily as needed (rash)., Disp: 30 g, Rfl: 5   oxybutynin  (DITROPAN  XL) 5 MG 24 hr tablet, Take 1 tablet (5 mg total) by mouth at bedtime., Disp: 90 tablet, Rfl: 0   pantoprazole  (PROTONIX ) 20 MG tablet, TAKE 1 TABLET(20 MG) BY MOUTH DAILY, Disp: 90 tablet, Rfl: 1   TRELEGY ELLIPTA  100-62.5-25 MCG/ACT AEPB, INHALE 1 PUFF INTO  THE LUNGS DAILY, Disp: 180 each, Rfl: 1   tretinoin  (RETIN-A ) 0.025 % cream, APPLY EXTERNALLY TO THE AFFECTED AREA AT BEDTIME AS NEEDED, Disp: 45 g, Rfl: 2 No current facility-administered medications for this visit.  Facility-Administered Medications Ordered in Other Visits:    cyanocobalamin  (VITAMIN B12) injection 1,000 mcg, 1,000 mcg, Intramuscular, Once, Avonne Boettcher, MD   cyanocobalamin  (VITAMIN B12) injection 1,000 mcg, 1,000 mcg, Intramuscular, Q30 days, Avonne Boettcher,  MD, 1,000 mcg at 02/05/24 8119  Observations/Objective: Patient is well-developed, well-nourished in no acute distress.  Resting comfortably at home.  Head is normocephalic, atraumatic.  No labored breathing. Speech is clear and coherent with logical content.  Patient is alert and oriented at baseline.    Assessment and Plan: 1. UTI symptoms (Primary) - nitrofurantoin, macrocrystal-monohydrate, (MACROBID) 100 MG capsule; Take 1 capsule (100 mg total) by mouth 2 (two) times daily for 5 days.  Dispense: 10 capsule; Refill: 0  2. Antibiotic-induced yeast infection - fluconazole  (DIFLUCAN ) 150 MG tablet; Take 1 tablet (150 mg total) by mouth daily.  Dispense: 1 tablet; Refill: 0   Follow Up Instructions: I discussed the assessment and treatment plan with the patient. The patient was provided an opportunity to ask questions and all were answered. The patient agreed with the plan and demonstrated an understanding of the instructions.  A copy of instructions were sent to the patient via MyChart unless otherwise noted below.    The patient was advised to call back or seek an in-person evaluation if the symptoms worsen or if the condition fails to improve as anticipated.    Ziza Hastings W Cornelious Bartolucci, NP

## 2024-05-04 ENCOUNTER — Inpatient Hospital Stay: Payer: Medicare Other | Attending: Oncology

## 2024-05-04 DIAGNOSIS — D509 Iron deficiency anemia, unspecified: Secondary | ICD-10-CM

## 2024-05-04 DIAGNOSIS — D519 Vitamin B12 deficiency anemia, unspecified: Secondary | ICD-10-CM | POA: Diagnosis not present

## 2024-05-04 MED ORDER — CYANOCOBALAMIN 1000 MCG/ML IJ SOLN
1000.0000 ug | INTRAMUSCULAR | Status: DC
  Administered 2024-05-04: 1000 ug via INTRAMUSCULAR
  Filled 2024-05-04: qty 1

## 2024-05-12 ENCOUNTER — Other Ambulatory Visit: Payer: Self-pay | Admitting: Family Medicine

## 2024-05-13 ENCOUNTER — Encounter: Payer: Self-pay | Admitting: Family Medicine

## 2024-05-13 DIAGNOSIS — E1169 Type 2 diabetes mellitus with other specified complication: Secondary | ICD-10-CM

## 2024-05-13 MED ORDER — MOUNJARO 5 MG/0.5ML ~~LOC~~ SOAJ: 5.0000 mg | SUBCUTANEOUS | 0 refills | Status: DC

## 2024-05-15 DIAGNOSIS — M25561 Pain in right knee: Secondary | ICD-10-CM | POA: Diagnosis not present

## 2024-05-15 DIAGNOSIS — M6281 Muscle weakness (generalized): Secondary | ICD-10-CM | POA: Diagnosis not present

## 2024-05-15 DIAGNOSIS — G8929 Other chronic pain: Secondary | ICD-10-CM | POA: Diagnosis not present

## 2024-05-15 DIAGNOSIS — M25661 Stiffness of right knee, not elsewhere classified: Secondary | ICD-10-CM | POA: Diagnosis not present

## 2024-05-25 ENCOUNTER — Inpatient Hospital Stay: Payer: Medicare Other

## 2024-05-28 DIAGNOSIS — S83241D Other tear of medial meniscus, current injury, right knee, subsequent encounter: Secondary | ICD-10-CM | POA: Diagnosis not present

## 2024-05-28 DIAGNOSIS — R937 Abnormal findings on diagnostic imaging of other parts of musculoskeletal system: Secondary | ICD-10-CM | POA: Diagnosis not present

## 2024-05-28 DIAGNOSIS — F17210 Nicotine dependence, cigarettes, uncomplicated: Secondary | ICD-10-CM | POA: Diagnosis not present

## 2024-06-04 ENCOUNTER — Other Ambulatory Visit: Payer: Self-pay | Admitting: Orthopedic Surgery

## 2024-06-04 ENCOUNTER — Inpatient Hospital Stay: Payer: Medicare Other | Attending: Oncology

## 2024-06-04 DIAGNOSIS — D519 Vitamin B12 deficiency anemia, unspecified: Secondary | ICD-10-CM | POA: Insufficient documentation

## 2024-06-04 DIAGNOSIS — D509 Iron deficiency anemia, unspecified: Secondary | ICD-10-CM

## 2024-06-04 MED ORDER — CYANOCOBALAMIN 1000 MCG/ML IJ SOLN
1000.0000 ug | INTRAMUSCULAR | Status: DC
  Administered 2024-06-04: 1000 ug via INTRAMUSCULAR
  Filled 2024-06-04: qty 1

## 2024-06-05 ENCOUNTER — Telehealth: Admitting: Physician Assistant

## 2024-06-05 DIAGNOSIS — H6505 Acute serous otitis media, recurrent, left ear: Secondary | ICD-10-CM | POA: Diagnosis not present

## 2024-06-05 DIAGNOSIS — J019 Acute sinusitis, unspecified: Secondary | ICD-10-CM

## 2024-06-05 DIAGNOSIS — B9689 Other specified bacterial agents as the cause of diseases classified elsewhere: Secondary | ICD-10-CM | POA: Diagnosis not present

## 2024-06-05 MED ORDER — DOXYCYCLINE HYCLATE 100 MG PO TABS: 100.0000 mg | ORAL_TABLET | Freq: Two times a day (BID) | ORAL | 0 refills | Status: DC

## 2024-06-05 NOTE — Patient Instructions (Signed)
 Amanda Webb, thank you for joining Angelia Kelp, PA-C for today's virtual visit.  While this provider is not your primary care provider (PCP), if your PCP is located in our provider database this encounter information will be shared with them immediately following your visit.   A [facility] MyChart account gives you access to today's visit and all your visits, tests, and labs performed at [facility]  click here if you don't have a [facility] MyChart account or go to [email or web address]  Consent: (Patient) Amanda Webb provided verbal consent for this virtual visit at the beginning of the encounter.  Current Medications:  Current Outpatient Medications:    doxycycline  (VIBRA -TABS) 100 MG tablet, Take 1 tablet (100 mg total) by mouth 2 (two) times daily., Disp: 20 tablet, Rfl: 0   Accu-Chek FastClix Lancets MISC, USE TO CHECK BLOOD SUGAR UP TO TWICE DAILY AS DIRECTED, Disp: 102 each, Rfl: 5   albuterol  (PROVENTIL ) (2.5 MG/3ML) 0.083% nebulizer solution, Inhale 3ml (one vial) via nebulized route every four hours around the clock for 24 hours, followed by one vial every 6 hours for the next 24 hours. After 48 hours, take 3mL via neb every 6-8 hours PRN cough, wheeze, Disp: 150 mL, Rfl: 1   albuterol  (VENTOLIN  HFA) 108 (90 Base) MCG/ACT inhaler, INHALE 1 TO 2 PUFFS BY MOUTH EVERY 4 TO 6 HOURS AS NEEDED FOR SHORTNESS OF BREATH, Disp: 18 g, Rfl: 2   albuterol  (VENTOLIN  HFA) 108 (90 Base) MCG/ACT inhaler, Inhale 2 puffs into the lungs every 6 (six) hours as needed for wheezing or shortness of breath., Disp: 8 g, Rfl: 0   ARIPiprazole (ABILIFY) 2 MG tablet, Take 2 mg by mouth every morning. , Disp: , Rfl:    atenolol  (TENORMIN ) 50 MG tablet, TAKE 1 TABLET(50 MG) BY MOUTH DAILY, Disp: 90 tablet, Rfl: 0   azelastine  (ASTELIN ) 0.1 % nasal spray, Place 1 spray into both nostrils 2 (two) times daily. Use in each nostril as directed, Disp: 30 mL, Rfl: 5   Cetirizine HCl 10 MG  CAPS, Take 10 mg by mouth daily with lunch. , Disp: , Rfl:    Cyanocobalamin  (B-12 COMPLIANCE INJECTION IJ), Inject 1 Dose as directed every 30 (thirty) days., Disp: , Rfl:    fluconazole  (DIFLUCAN ) 150 MG tablet, Take 1 tablet (150 mg total) by mouth daily., Disp: 1 tablet, Rfl: 0   fluticasone (FLONASE) 50 MCG/ACT nasal spray, SHAKE LIQUID AND USE 2 SPRAYS IN EACH NOSTRIL EVERY DAY, Disp: 48 g, Rfl: 0   fluvoxaMINE (LUVOX) 100 MG tablet, Take 150 mg by mouth every morning. , Disp: , Rfl:    gabapentin  (NEURONTIN ) 100 MG capsule, Take by mouth., Disp: , Rfl:    glucose blood (ACCU-CHEK GUIDE) test strip, CHECK BLOOD SUGAR UP TO EVERY DAY AS DIRECTED, Disp: 200 strip, Rfl: 5   hydrOXYzine  (VISTARIL ) 25 MG capsule, TAKE 1 CAPSULE(25 MG) BY MOUTH EVERY 8 HOURS AS NEEDED, Disp: 30 capsule, Rfl: 0   ketoconazole  (NIZORAL ) 2 % shampoo, APPLY EXTERNALLY 2 TIMES A WEEK, Disp: 120 mL, Rfl: 2   lamoTRIgine (LAMICTAL) 200 MG tablet, Take by mouth., Disp: , Rfl:    levothyroxine  (SYNTHROID ) 50 MCG tablet, Take 1 tablet (50 mcg total) by mouth daily before breakfast., Disp: 90 tablet, Rfl: 1   montelukast  (SINGULAIR ) 10 MG tablet, Take 1 tablet (10 mg total) by mouth at bedtime., Disp: 90 tablet, Rfl: 1   MOUNJARO  5 MG/0.5ML Pen, Inject 5 mg into the  skin once a week., Disp: 2 mL, Rfl: 0   Multiple Vitamin (MULTIVITAMIN WITH MINERALS) TABS tablet, Take 1 tablet by mouth daily., Disp: , Rfl:    nystatin  cream (MYCOSTATIN ), Apply 1 application. topically 2 (two) times daily as needed (rash)., Disp: 30 g, Rfl: 5   oxybutynin  (DITROPAN  XL) 5 MG 24 hr tablet, Take 1 tablet (5 mg total) by mouth at bedtime., Disp: 90 tablet, Rfl: 0   pantoprazole  (PROTONIX ) 20 MG tablet, TAKE 1 TABLET(20 MG) BY MOUTH DAILY, Disp: 90 tablet, Rfl: 1   TRELEGY ELLIPTA  100-62.5-25 MCG/ACT AEPB, INHALE 1 PUFF INTO THE LUNGS DAILY, Disp: 180 each, Rfl: 1   tretinoin  (RETIN-A ) 0.025 % cream, APPLY EXTERNALLY TO THE AFFECTED AREA AT BEDTIME  AS NEEDED, Disp: 45 g, Rfl: 2 No current facility-administered medications for this visit.  Facility-Administered Medications Ordered in Other Visits:    cyanocobalamin  (VITAMIN B12) injection 1,000 mcg, 1,000 mcg, Intramuscular, Once, Avonne Boettcher, MD   cyanocobalamin  (VITAMIN B12) injection 1,000 mcg, 1,000 mcg, Intramuscular, Q30 days, Avonne Boettcher, MD, 1,000 mcg at 02/05/24 0909   Medications ordered in this encounter:  Meds ordered this encounter  Medications   doxycycline  (VIBRA -TABS) 100 MG tablet    Sig: Take 1 tablet (100 mg total) by mouth 2 (two) times daily.    Dispense:  20 tablet    Refill:  0    Supervising Provider:   LAMPTEY, PHILIP O [[id number]]     *If you need refills on other medications prior to your next appointment, please contact your pharmacy*  Follow-Up: Call back or seek an in-person evaluation if the symptoms worsen or if the condition fails to improve as anticipated.  [facility] [phone]  Other Instructions Otitis Media, Adult  Otitis media occurs when there is inflammation and fluid in the middle ear with signs and symptoms of an acute infection. The middle ear is a part of the ear that contains bones for hearing as well as air that helps send sounds to the brain. When infected fluid builds up in this space, it causes pressure and can lead to an ear infection. The eustachian tube connects the middle ear to the back of the nose (nasopharynx) and normally allows air into the middle ear. If the eustachian tube becomes blocked, fluid can build up and become infected. What are the causes? This condition is caused by a blockage in the eustachian tube. This can be caused by mucus or by swelling of the tube. Problems that can cause a blockage include: A cold or other upper respiratory infection. Allergies. An irritant, such as tobacco smoke. Enlarged adenoids. The adenoids are areas of soft tissue located high in the back of the throat,  behind the nose and the roof of the mouth. They are part of the body's defense system (immune system). A mass in the nasopharynx. Damage to the ear caused by pressure changes (barotrauma). What increases the risk? You are more likely to develop this condition if you: Smoke or are exposed to tobacco smoke. Have an opening in the roof of your mouth (cleft palate). Have gastroesophageal reflux. Have an immune system disorder. What are the signs or symptoms? Symptoms of this condition include: Ear pain. Fever. Decreased hearing. Tiredness (lethargy). Fluid leaking from the ear, if the eardrum is ruptured or has burst. Ringing in the ear. How is this diagnosed?  This condition is diagnosed with a physical exam. During the exam, your health care provider will use  an instrument called an otoscope to look in your ear and check for redness, swelling, and fluid. He or she will also ask about your symptoms. Your health care provider may also order tests, such as: A pneumatic otoscopy. This is a test to check the movement of the eardrum. It is done by squeezing a small amount of air into the ear. A tympanogram. This is a test that shows how well the eardrum moves in response to air pressure in the ear canal. It provides a graph for your health care provider to review. How is this treated? This condition can go away on its own within 3-5 days. But if the condition is caused by a bacterial infection and does not go away on its own, or if it keeps coming back, your health care provider may: Prescribe antibiotic medicine to treat the infection. Prescribe or recommend medicines to control pain. Follow these instructions at home: Take over-the-counter and prescription medicines only as told by your health care provider. If you were prescribed an antibiotic medicine, take it as told by your health care provider. Do not stop taking the antibiotic even if you start to feel better. Keep all follow-up visits.  This is important. Contact a health care provider if: You have bleeding from your nose. There is a lump on your neck. You are not feeling better in 5 days. You feel worse instead of better. Get help right away if: You have severe pain that is not controlled with medicine. You have swelling, redness, or pain around your ear. You have stiffness in your neck. A part of your face is not moving (paralyzed). The bone behind your ear (mastoid bone) is tender when you touch it. You develop a severe headache. Summary Otitis media is redness, soreness, and swelling of the middle ear, usually resulting in pain and decreased hearing. This condition can go away on its own within 3-5 days. If the problem does not go away in 3-5 days, your health care provider may give you medicines to treat the infection. If you were prescribed an antibiotic medicine, take it as told by your health care provider. Follow all instructions that were given to you by your health care provider. This information is not intended to replace advice given to you by your health care provider. Make sure you discuss any questions you have with your health care provider. Document Revised: 03/13/2021 Document Reviewed: 03/13/2021 Elsevier Patient Education  2024 [employer]   If you have been instructed to have an in-person evaluation today at a local Urgent Care facility, please use the link below. It will take you to a list of all of our available [facility], including address, phone number and hours of operation. Please do not delay care.  [facility]  If you or a family member do not have a primary care provider, use the link below to schedule a visit and establish care. When you choose a [facility] primary care physician or advanced practice provider, you gain a long-term partner in health. Find a Primary Care Provider  Learn more about [facility]'s in-office and virtual care options: [facility] - Get Care Now

## 2024-06-05 NOTE — Progress Notes (Signed)
 Virtual Visit Consent   Amanda Webb, you are scheduled for a virtual visit with a [facility] provider today. Just as with appointments in the office, your consent must be obtained to participate. Your consent will be active for this visit and any virtual visit you may have with one of our providers in the next 365 days. If you have a MyChart account, a copy of this consent can be sent to you electronically.  As this is a virtual visit, video technology does not allow for your provider to perform a traditional examination. This may limit your provider's ability to fully assess your condition. If your provider identifies any concerns that need to be evaluated in person or the need to arrange testing (such as labs, EKG, etc.), we will make arrangements to do so. Although advances in technology are sophisticated, we cannot ensure that it will always work on either your end or our end. If the connection with a video visit is poor, the visit may have to be switched to a telephone visit. With either a video or telephone visit, we are not always able to ensure that we have a secure connection.  By engaging in this virtual visit, you consent to the provision of healthcare and authorize for your insurance to be billed (if applicable) for the services provided during this visit. Depending on your insurance coverage, you may receive a charge related to this service.  I need to obtain your verbal consent now. Are you willing to proceed with your visit today? Amanda Webb has provided verbal consent on 06/05/2024 for a virtual visit (video or telephone). Angelia Kelp, PA-C  Date: 06/05/2024 9:11 AM   Virtual Visit via Video Note   I, Angelia Kelp, connected with  Amanda Webb  ([id number], 1971/05/05) on 06/05/24 at  9:00 AM EDT by a video-enabled telemedicine application and verified that I am speaking with the correct person using two identifiers.  Location: Patient: Virtual Visit Location  Patient: Home Provider: Virtual Visit Location Provider: Home Office   I discussed the limitations of evaluation and management by telemedicine and the availability of in person appointments. The patient expressed understanding and agreed to proceed.    History of Present Illness: Amanda Webb is a 53 y.o. who identifies as a female who was assigned female at birth, and is being seen today for sinus congestion and ear pain.  HPI: Sinusitis This is a recurrent (Most recently treated on 03/27/24) problem. The current episode started 1 to 4 weeks ago. The problem has been gradually worsening since onset. There has been no fever. The pain is moderate. Associated symptoms include chills (two nights), congestion, coughing (from drainage), diaphoresis, ear pain (left), headaches, sinus pressure (left side pain), a sore throat (mild and now resolved) and swollen glands (pea-size swollen lymph node in occipital region and under left ear). Pertinent negatives include no shortness of breath. (Rhinorrhea and post nasal drainage) Treatments tried: zyrtec, flonase, azelastine  (all 3 of these daily) The treatment provided no relief.     Problems:  Patient Active Problem List   Diagnosis Date Noted   Major depressive disorder, recurrent, in partial remission (HCC) 11/08/2020   Cough 10/21/2020   Status post laparoscopic cholecystectomy 07/28/2020   Morbid obesity (HCC) 07/05/2020   Hepatic steatosis 06/23/2020   Lymphedema 03/09/2020   Chronic venous insufficiency 03/09/2020   Bilateral lower extremity edema 02/16/2020   Female cystocele 10/21/2018   Urinary incontinence, mixed 10/21/2018   B12  deficiency 04/08/2018   Iron  deficiency anemia 03/14/2018   GAD (generalized anxiety disorder) 03/10/2018   Acute right-sided low back pain 10/28/2017   Tobacco abuse 05/09/2017   Multiple thyroid  nodules 12/28/2016   Simple chronic bronchitis (HCC) 12/13/2016   BMI 40.0-44.9, adult (HCC) 09/26/2016   HLD  (hyperlipidemia) 09/26/2016   Vitamin D deficiency 09/26/2016   Type 2 diabetes mellitus with other specified complication (HCC) 06/17/2015   Candidiasis of skin and nail 02/21/2015   Candidal intertrigo 02/21/2015   Cellulitis of right lower extremity 02/18/2015   Benign cyst of right kidney 01/17/2015   Chronic sinusitis 11/15/2014   Whiplash injury 09/03/2014   Heart palpitations 04/02/2014   Palpitations 04/02/2014   Allergic rhinitis 12/31/2013   Spondylosis of lumbar region without myelopathy or radiculopathy 12/28/2013   Chronic low back pain 09/28/2013   Other fatigue 09/28/2013   Hypothyroidism 09/28/2013   Alteration of body temperature 09/28/2013   Other fatigue 09/28/2013   Plantar fasciitis, bilateral 07/10/2013   Tarsal tunnel syndrome of right side 07/10/2013   Plantar fascial fibromatosis 07/10/2013   Encounter for surveillance of injectable contraceptive 05/13/2013   Diuresis excessive 03/24/2013   Polyuria 03/24/2013   Abnormal uterine and vaginal bleeding, unspecified 03/24/2013   Arthritis, multiple joint involvement 03/17/2013   Fibroblastic disorder 07/28/2012   Borderline personality disorder (HCC) 12/17/2010   Borderline personality disorder in adult [facility]) 12/17/2010   Essential hypertension 02/18/2010   Carpal tunnel syndrome on both sides 12/17/2006   Major depressive disorder, single episode 02/21/1998   Major depressive disorder, single episode 02/21/1998   Disk prolapse 04/17/1995   Depression with anxiety 03/18/1995   OCD (obsessive compulsive disorder) 03/18/1995   Severe anxiety with panic 03/18/1995    Allergies:  Allergies  Allergen Reactions   Bee Venom Anaphylaxis, Hives and Swelling    Carries Epi pen.    Cat Dander Itching, Other (See Comments) and Swelling    Allergic to trees, nuts, wheat, grass, cats & dogs - itchy watery eyes, swelling. Uses Zyrtec & Flonase & Benadryl if really bad. Used to get allergy shots. Allergic to trees,  nuts, wheat, grass, cats & dogs - itchy watery eyes, swelling. Uses Zyrtec & Flonase & Benadryl if really bad. Used to get allergy shots.   Dog Epithelium Cough and Shortness Of Breath   Dog Epithelium (Canis Lupus Familiaris) Shortness Of Breath   Dog Fennel Cough and Shortness Of Breath   Dog Fennel Allergy Skin Test Shortness Of Breath   Dust Mite Extract Cough and Shortness Of Breath   Tetracyclines & Related Nausea And Vomiting   Lactose Diarrhea and Nausea And Vomiting   Milk (Cow) Diarrhea and Nausea And Vomiting   Milk Protein Diarrhea and Nausea And Vomiting   Milk-Related Compounds Diarrhea and Nausea And Vomiting   Tape Other (See Comments) and Rash    Needs to use paper tape. Breaks out with severe rash, pulls skin off when using adhesive.   Wound Dressing Adhesive Rash    Other reaction(s): Other Needs to use paper tape. Breaks out with severe rash, pulls skin off when using adhesive.  Needs to use paper tape. Breaks out with severe rash, pulls skin off when using adhesive. Needs to use paper tape. Breaks out with severe rash, pulls skin off when using adhesive.   Medications:  Current Outpatient Medications:    doxycycline  (VIBRA -TABS) 100 MG tablet, Take 1 tablet (100 mg total) by mouth 2 (two) times daily., Disp: 20 tablet, Rfl: 0  Accu-Chek FastClix Lancets MISC, USE TO CHECK BLOOD SUGAR UP TO TWICE DAILY AS DIRECTED, Disp: 102 each, Rfl: 5   albuterol  (PROVENTIL ) (2.5 MG/3ML) 0.083% nebulizer solution, Inhale 3ml (one vial) via nebulized route every four hours around the clock for 24 hours, followed by one vial every 6 hours for the next 24 hours. After 48 hours, take 3mL via neb every 6-8 hours PRN cough, wheeze, Disp: 150 mL, Rfl: 1   albuterol  (VENTOLIN  HFA) 108 (90 Base) MCG/ACT inhaler, INHALE 1 TO 2 PUFFS BY MOUTH EVERY 4 TO 6 HOURS AS NEEDED FOR SHORTNESS OF BREATH, Disp: 18 g, Rfl: 2   albuterol  (VENTOLIN  HFA) 108 (90 Base) MCG/ACT inhaler, Inhale 2 puffs into the  lungs every 6 (six) hours as needed for wheezing or shortness of breath., Disp: 8 g, Rfl: 0   ARIPiprazole (ABILIFY) 2 MG tablet, Take 2 mg by mouth every morning. , Disp: , Rfl:    atenolol  (TENORMIN ) 50 MG tablet, TAKE 1 TABLET(50 MG) BY MOUTH DAILY, Disp: 90 tablet, Rfl: 0   azelastine  (ASTELIN ) 0.1 % nasal spray, Place 1 spray into both nostrils 2 (two) times daily. Use in each nostril as directed, Disp: 30 mL, Rfl: 5   Cetirizine HCl 10 MG CAPS, Take 10 mg by mouth daily with lunch. , Disp: , Rfl:    Cyanocobalamin  (B-12 COMPLIANCE INJECTION IJ), Inject 1 Dose as directed every 30 (thirty) days., Disp: , Rfl:    fluconazole  (DIFLUCAN ) 150 MG tablet, Take 1 tablet (150 mg total) by mouth daily., Disp: 1 tablet, Rfl: 0   fluticasone (FLONASE) 50 MCG/ACT nasal spray, SHAKE LIQUID AND USE 2 SPRAYS IN EACH NOSTRIL EVERY DAY, Disp: 48 g, Rfl: 0   fluvoxaMINE (LUVOX) 100 MG tablet, Take 150 mg by mouth every morning. , Disp: , Rfl:    gabapentin  (NEURONTIN ) 100 MG capsule, Take by mouth., Disp: , Rfl:    glucose blood (ACCU-CHEK GUIDE) test strip, CHECK BLOOD SUGAR UP TO EVERY DAY AS DIRECTED, Disp: 200 strip, Rfl: 5   hydrOXYzine  (VISTARIL ) 25 MG capsule, TAKE 1 CAPSULE(25 MG) BY MOUTH EVERY 8 HOURS AS NEEDED, Disp: 30 capsule, Rfl: 0   ketoconazole  (NIZORAL ) 2 % shampoo, APPLY EXTERNALLY 2 TIMES A WEEK, Disp: 120 mL, Rfl: 2   lamoTRIgine (LAMICTAL) 200 MG tablet, Take by mouth., Disp: , Rfl:    levothyroxine  (SYNTHROID ) 50 MCG tablet, Take 1 tablet (50 mcg total) by mouth daily before breakfast., Disp: 90 tablet, Rfl: 1   montelukast  (SINGULAIR ) 10 MG tablet, Take 1 tablet (10 mg total) by mouth at bedtime., Disp: 90 tablet, Rfl: 1   MOUNJARO  5 MG/0.5ML Pen, Inject 5 mg into the skin once a week., Disp: 2 mL, Rfl: 0   Multiple Vitamin (MULTIVITAMIN WITH MINERALS) TABS tablet, Take 1 tablet by mouth daily., Disp: , Rfl:    nystatin  cream (MYCOSTATIN ), Apply 1 application. topically 2 (two) times  daily as needed (rash)., Disp: 30 g, Rfl: 5   oxybutynin  (DITROPAN  XL) 5 MG 24 hr tablet, Take 1 tablet (5 mg total) by mouth at bedtime., Disp: 90 tablet, Rfl: 0   pantoprazole  (PROTONIX ) 20 MG tablet, TAKE 1 TABLET(20 MG) BY MOUTH DAILY, Disp: 90 tablet, Rfl: 1   TRELEGY ELLIPTA  100-62.5-25 MCG/ACT AEPB, INHALE 1 PUFF INTO THE LUNGS DAILY, Disp: 180 each, Rfl: 1   tretinoin  (RETIN-A ) 0.025 % cream, APPLY EXTERNALLY TO THE AFFECTED AREA AT BEDTIME AS NEEDED, Disp: 45 g, Rfl: 2 No current facility-administered medications for  this visit.  Facility-Administered Medications Ordered in Other Visits:    cyanocobalamin  (VITAMIN B12) injection 1,000 mcg, 1,000 mcg, Intramuscular, Once, Avonne Boettcher, MD   cyanocobalamin  (VITAMIN B12) injection 1,000 mcg, 1,000 mcg, Intramuscular, Q30 days, Avonne Boettcher, MD, 1,000 mcg at 02/05/24 7829  Observations/Objective: Patient is well-developed, well-nourished in no acute distress.  Resting comfortably at home.  Head is normocephalic, atraumatic.  No labored breathing.  Speech is clear and coherent with logical content.  Patient is alert and oriented at baseline.    Assessment and Plan: 1. Acute bacterial sinusitis (Primary) - doxycycline  (VIBRA -TABS) 100 MG tablet; Take 1 tablet (100 mg total) by mouth 2 (two) times daily.  Dispense: 20 tablet; Refill: 0  2. Recurrent acute serous otitis media of left ear - doxycycline  (VIBRA -TABS) 100 MG tablet; Take 1 tablet (100 mg total) by mouth 2 (two) times daily.  Dispense: 20 tablet; Refill: 0  - Worsening symptoms that have not responded to OTC medications.  - Will give Doxycycline  - Restart singulair  she has at home - Continue allergy medications.  - Discussed stopping calcium nasal spray from orthopedics as this had been felt to be source of recurrent issues previously, but she had restarted it with her upcoming knee surgery - Steam and humidifier can help - Stay well hydrated and get plenty of rest.   - Seek in person evaluation if no symptom improvement or if symptoms worsen   Follow Up Instructions: I discussed the assessment and treatment plan with the patient. The patient was provided an opportunity to ask questions and all were answered. The patient agreed with the plan and demonstrated an understanding of the instructions.  A copy of instructions were sent to the patient via MyChart unless otherwise noted below.    The patient was advised to call back or seek an in-person evaluation if the symptoms worsen or if the condition fails to improve as anticipated.    Angelia Kelp, PA-C

## 2024-06-15 ENCOUNTER — Ambulatory Visit (INDEPENDENT_AMBULATORY_CARE_PROVIDER_SITE_OTHER)

## 2024-06-15 VITALS — BP 154/89 | HR 74 | Ht 67.0 in | Wt 245.3 lb

## 2024-06-15 DIAGNOSIS — Z3042 Encounter for surveillance of injectable contraceptive: Secondary | ICD-10-CM | POA: Diagnosis not present

## 2024-06-15 MED ORDER — MEDROXYPROGESTERONE ACETATE 150 MG/ML IM SUSP
150.0000 mg | Freq: Once | INTRAMUSCULAR | Status: AC
  Administered 2024-06-15: 150 mg via INTRAMUSCULAR

## 2024-06-15 NOTE — Progress Notes (Signed)
    NURSE VISIT NOTE  Subjective:    Patient ID: RANA HOCHSTEIN, female    DOB: August 05, 1971, 53 y.o.   MRN: [id number]  HPI  Patient is a 53 y.o. [id number] female who presents for depo provera  injection.   Objective:    BP (!) 154/89   Pulse 74   Ht 5' 7 (1.702 m)   Wt 245 lb 4.8 oz (111.3 kg)   BMI 38.42 kg/m   Last Annual: 02/27/23. Last pap: 02/27/23. Last Depo-Provera : 03/30/24. Side Effects if any: n/a. Serum HCG indicated? No . Depo-Provera  150 mg IM given by: Waddell Maxim, CMA. Site: Right Deltoid   Assessment:   1. Encounter for Depo-Provera  contraception      Plan:   Next appointment due between 08/31/24 and 09/14/24.    Waddell JONELLE Maxim, CMA

## 2024-06-15 NOTE — Patient Instructions (Signed)
 Medroxyprogesterone Injection (Contraception) What is this medication? MEDROXYPROGESTERONE (me DROX ee proe JES te rone) prevents ovulation and pregnancy. It belongs to a group of medications called contraceptives. This medication is a progestin hormone. This medicine may be used for other purposes; ask your health care provider or pharmacist if you have questions. COMMON BRAND NAME(S): Depo-Provera, Depo-subQ Provera 104 What should I tell my care team before I take this medication? They need to know if you have any of these conditions: Asthma Blood clots Breast cancer or family history of breast cancer Depression Diabetes Eating disorder (anorexia nervosa) Frequently drink alcohol Heart attack High blood pressure HIV infection or AIDS Kidney disease Liver disease Migraine headaches Osteoporosis, weak bones Seizures Stroke Tobacco use Vaginal bleeding An unusual or allergic reaction to medroxyprogesterone, other medications, foods, dyes, or preservatives Pregnant or trying to get pregnant Breast-feeding How should I use this medication? Depo-Provera CI contraceptive injection is given into a muscle. Depo-subQ Provera 104 injection is given under the skin. It is given in a hospital or clinic setting. The injection is usually given during the first 5 days after the start of a menstrual period or 6 weeks after delivery of a baby. A patient package insert for the product will be given with each prescription and refill. Be sure to read this information carefully each time. The sheet may change often. Talk to your care team about the use of this medication in children. Special care may be needed. These injections have been used in female children who have started having menstrual periods. Overdosage: If you think you have taken too much of this medicine contact a poison control center or emergency room at once. NOTE: This medicine is only for you. Do not share this medicine with  others. What if I miss a dose? Keep appointments for follow-up doses. You must get an injection once every 3 months. It is important not to miss your dose. Call your care team if you are unable to keep an appointment. What may interact with this medication? Antibiotics or medications for infections, especially rifampin and griseofulvin Antivirals for HIV or hepatitis Aprepitant Armodafinil Bexarotene Bosentan Medications for seizures, such as carbamazepine, felbamate, oxcarbazepine, phenytoin, phenobarbital, primidone, topiramate Mitotane Modafinil St. John's Wort This list may not describe all possible interactions. Give your health care provider a list of all the medicines, herbs, non-prescription drugs, or dietary supplements you use. Also tell them if you smoke, drink alcohol, or use illegal drugs. Some items may interact with your medicine. What should I watch for while using this medication? This medication does not protect you against HIV infection (AIDS) or other sexually transmitted diseases. Use of this product may cause you to lose calcium from your bones. Loss of calcium may cause weak bones (osteoporosis). Only use this product for more than 2 years if other forms of birth control are not right for you. The longer you use this product for birth control the more likely you will be at risk for weak bones. Ask your care team how you can keep strong bones. You may have a change in bleeding pattern or irregular periods. Many females stop having periods while taking this medication. If you have received your injections on time, your chance of being pregnant is very low. If you think you may be pregnant, see your care team as soon as possible. Tell your care team if you want to get pregnant within the next year. The effect of this medication may last a long time  after you get your last injection. What side effects may I notice from receiving this medication? Side effects that you should  report to your care team as soon as possible: Allergic reactions--skin rash, itching, hives, swelling of the face, lips, tongue, or throat Blood clot--pain, swelling, or warmth in the leg, shortness of breath, chest pain Gallbladder problems--severe stomach pain, nausea, vomiting, fever Increase in blood pressure Liver injury--right upper belly pain, loss of appetite, nausea, light-colored stool, dark yellow or brown urine, yellowing skin or eyes, unusual weakness or fatigue New or worsening migraines or headaches Seizures Stroke--sudden numbness or weakness of the face, arm, or leg, trouble speaking, confusion, trouble walking, loss of balance or coordination, dizziness, severe headache, change in vision Unusual vaginal discharge, itching, or odor Worsening mood, feelings of depression Side effects that usually do not require medical attention (report to your care team if they continue or are bothersome): Breast pain or tenderness Dark patches of the skin on the face or other sun-exposed areas Irregular menstrual cycles or spotting Nausea Weight gain This list may not describe all possible side effects. Call your doctor for medical advice about side effects. You may report side effects to FDA at 1-800-FDA-1088. Where should I keep my medication? This injection is only given by a care team. It will not be stored at home. NOTE: This sheet is a summary. It may not cover all possible information. If you have questions about this medicine, talk to your doctor, pharmacist, or health care provider.  2024 Elsevier/Gold Standard (2021-08-16 00:00:00)

## 2024-06-17 DIAGNOSIS — R937 Abnormal findings on diagnostic imaging of other parts of musculoskeletal system: Secondary | ICD-10-CM | POA: Diagnosis not present

## 2024-06-17 DIAGNOSIS — S83241D Other tear of medial meniscus, current injury, right knee, subsequent encounter: Secondary | ICD-10-CM | POA: Diagnosis not present

## 2024-06-17 DIAGNOSIS — F17210 Nicotine dependence, cigarettes, uncomplicated: Secondary | ICD-10-CM | POA: Diagnosis not present

## 2024-06-22 ENCOUNTER — Encounter: Payer: Self-pay | Admitting: Orthopedic Surgery

## 2024-06-24 ENCOUNTER — Inpatient Hospital Stay: Payer: Medicare Other

## 2024-06-25 ENCOUNTER — Other Ambulatory Visit: Payer: Self-pay | Admitting: Orthopedic Surgery

## 2024-06-29 ENCOUNTER — Encounter
Admission: RE | Admit: 2024-06-29 | Discharge: 2024-06-29 | Disposition: A | Discharge status: Home / Self Care | Source: Ambulatory Visit | Attending: Orthopedic Surgery | Admitting: Orthopedic Surgery

## 2024-06-29 ENCOUNTER — Other Ambulatory Visit: Payer: Self-pay

## 2024-06-29 DIAGNOSIS — Z01812 Encounter for preprocedural laboratory examination: Secondary | ICD-10-CM

## 2024-06-29 DIAGNOSIS — I10 Essential (primary) hypertension: Secondary | ICD-10-CM

## 2024-06-29 DIAGNOSIS — E119 Type 2 diabetes mellitus without complications: Secondary | ICD-10-CM

## 2024-06-29 HISTORY — DX: Pneumonia, unspecified organism: J18.9

## 2024-06-29 HISTORY — DX: Unspecified asthma, uncomplicated: J45.909

## 2024-06-29 NOTE — Patient Instructions (Addendum)
 Your procedure is scheduled on: 07/03/24 - Friday Report to the Registration Desk on the 1st floor of the [facility]. To find out your arrival time, please call [phone] between 1PM - 3PM on: 07/02/24 - Thursday If your arrival time is 6:00 am, do not arrive before that time as the [facility] entrance doors do not open until 6:00 am.  REMEMBER: Instructions that are not followed completely may result in serious medical risk, up to and including death; or upon the discretion of your surgeon and anesthesiologist your surgery may need to be rescheduled.  Do not eat food after midnight the night before surgery.  No gum chewing or hard candies.  You may however, drink CLEAR liquids up to 2 hours before you are scheduled to arrive for your surgery. Do not drink anything within 2 hours of your scheduled arrival time.  Clear liquids include: - water   In addition, your doctor has ordered for you to drink the provided:  Gatorade G2 Drinking this carbohydrate drink up to two hours before surgery helps to reduce insulin resistance and improve patient outcomes. Please complete drinking 2 hours before scheduled arrival time.  One week prior to surgery: Stop Anti-inflammatories (NSAIDS) such as Advil , Aleve, Ibuprofen , Motrin , Naproxen, Naprosyn and Aspirin  based products such as Excedrin, Goody's Powder, BC Powder. You may take Tylenol  if needed for pain up until the day of surgery.  Stop ANY OVER THE COUNTER supplements until after surgery : Multiple Vitamin .  HOLD MOUNJARO  7 days prior to your surgery.  ON THE DAY OF SURGERY ONLY TAKE THESE MEDICATIONS WITH SIPS OF WATER:  ARIPiprazole (ABILIFY)  atenolol  (TENORMIN )  fluticasone  (FLONASE )  fluvoxaMINE (LUVOX) gabapentin  (NEURONTIN )   lamoTRIgine (LAMICTAL)  levothyroxine  (SYNTHROID )  albuterol  (PROVENTIL ) nebulizer pantoprazole  (PROTONIX )   Use inhalers TRELEGY ELLIPTA  and Albuterol  on the day of surgery and bring to the  hospital.   No Alcohol for 24 hours before or after surgery.  No Smoking including e-cigarettes for 24 hours before surgery.  No chewable tobacco products for at least 6 hours before surgery.  No nicotine patches on the day of surgery.  Do not use any recreational drugs for at least a week (preferably 2 weeks) before your surgery.  Please be advised that the combination of cocaine and anesthesia may have negative outcomes, up to and including death. If you test positive for cocaine, your surgery will be cancelled.  On the morning of surgery brush your teeth with toothpaste and water, you may rinse your mouth with mouthwash if you wish. Do not swallow any toothpaste or mouthwash.  Use CHG Soap or wipes as directed on instruction sheet.  Do not wear jewelry, make-up, hairpins, clips or nail polish.  For welded (permanent) jewelry: bracelets, anklets, waist bands, etc.  Please have this removed prior to surgery.  If it is not removed, there is a chance that hospital personnel will need to cut it off on the day of surgery.  Do not wear lotions, powders, or perfumes.   Do not shave body hair from the neck down 48 hours before surgery.  Contact lenses, hearing aids and dentures may not be worn into surgery.  Do not bring valuables to the hospital. [facility] is not responsible for any missing/lost belongings or valuables.   Notify your doctor if there is any change in your medical condition (cold, fever, infection).  Wear comfortable clothing (specific to your surgery type) to the hospital.  After surgery, you can help  prevent lung complications by doing breathing exercises.  Take deep breaths and cough every 1-2 hours. Your doctor may order a device called an Incentive Spirometer to help you take deep breaths. When coughing or sneezing, hold a pillow firmly against your incision with both hands. This is called "splinting." Doing this helps protect your incision. It also decreases  belly discomfort.  If you are being admitted to the hospital overnight, leave your suitcase in the car. After surgery it may be brought to your room.  In case of increased patient census, it may be necessary for you, the patient, to continue your postoperative care in the Same Day Surgery department.  If you are being discharged the day of surgery, you will not be allowed to drive home. You will need a responsible individual to drive you home and stay with you for 24 hours after surgery.   If you are taking public transportation, you will need to have a responsible individual with you.  Please call the Pre-admissions Testing Dept. at [phone] if you have any questions about these instructions.  Surgery Visitation Policy:  Patients having surgery or a procedure may have two visitors.  Children under the age of 83 must have an adult with them who is not the patient.  Inpatient Visitation:    Visiting hours are 7 a.m. to 8 p.m. Up to four visitors are allowed at one time in a patient room. The visitors may rotate out with other people during the day.  One visitor age 45 or older may stay with the patient overnight and must be in the room by 8 p.m.   Merchandiser, retail to address health-related social needs:  [email or web address]    Preparing for Surgery with CHLORHEXIDINE  GLUCONATE (CHG) Soap  Chlorhexidine  Gluconate (CHG) Soap  o An antiseptic cleaner that kills germs and bonds with the skin to continue killing germs even after washing  o Used for showering the night before surgery and morning of surgery  Before surgery, you can play an important role by reducing the number of germs on your skin.  CHG (Chlorhexidine  gluconate) soap is an antiseptic cleanser which kills germs and bonds with the skin to continue killing germs even after washing.  Please do not use if you have an allergy to CHG or antibacterial soaps. If your skin becomes  reddened/irritated stop using the CHG.  1. Shower the NIGHT BEFORE SURGERY and the MORNING OF SURGERY with CHG soap.  2. If you choose to wash your hair, wash your hair first as usual with your normal shampoo.  3. After shampooing, rinse your hair and body thoroughly to remove the shampoo.  4. Use CHG as you would any other liquid soap. You can apply CHG directly to the skin and wash gently with a scrungie or a clean washcloth.  5. Apply the CHG soap to your body only from the neck down. Do not use on open wounds or open sores. Avoid contact with your eyes, ears, mouth, and genitals (private parts). Wash face and genitals (private parts) with your normal soap.  6. Wash thoroughly, paying special attention to the area where your surgery will be performed.  7. Thoroughly rinse your body with warm water.  8. Do not shower/wash with your normal soap after using and rinsing off the CHG soap.  9. Pat yourself dry with a clean towel.  10. Wear clean pajamas to bed the night before surgery.  12. Place clean sheets on your bed the night  of your first shower and do not sleep with pets.  13. Shower again with the CHG soap on the day of surgery prior to arriving at the hospital.  14. Do not apply any deodorants/lotions/powders.  15. Please wear clean clothes to the hospital.

## 2024-06-30 ENCOUNTER — Encounter
Admission: RE | Admit: 2024-06-30 | Discharge: 2024-06-30 | Disposition: A | Discharge status: Home / Self Care | Source: Ambulatory Visit | Attending: Orthopedic Surgery | Admitting: Orthopedic Surgery

## 2024-06-30 DIAGNOSIS — E119 Type 2 diabetes mellitus without complications: Secondary | ICD-10-CM | POA: Diagnosis not present

## 2024-06-30 DIAGNOSIS — Z01818 Encounter for other preprocedural examination: Secondary | ICD-10-CM | POA: Diagnosis not present

## 2024-06-30 DIAGNOSIS — R9431 Abnormal electrocardiogram [ECG] [EKG]: Secondary | ICD-10-CM | POA: Insufficient documentation

## 2024-06-30 DIAGNOSIS — Z01810 Encounter for preprocedural cardiovascular examination: Secondary | ICD-10-CM | POA: Diagnosis not present

## 2024-06-30 DIAGNOSIS — I10 Essential (primary) hypertension: Secondary | ICD-10-CM | POA: Diagnosis not present

## 2024-06-30 LAB — CBC
HCT: 41.7 % (ref 36.0–46.0)
Hemoglobin: 14.5 g/dL (ref 12.0–15.0)
MCH: 33.5 pg (ref 26.0–34.0)
MCHC: 34.8 g/dL (ref 30.0–36.0)
MCV: 96.3 fL (ref 80.0–100.0)
Platelets: 176 K/uL (ref 150–400)
RBC: 4.33 MIL/uL (ref 3.87–5.11)
RDW: 12.1 % (ref 11.5–15.5)
WBC: 9.4 K/uL (ref 4.0–10.5)
nRBC: 0 % (ref 0.0–0.2)

## 2024-06-30 LAB — BASIC METABOLIC PANEL WITH GFR
Anion gap: 10 (ref 5–15)
BUN: 6 mg/dL (ref 6–20)
CO2: 24 mmol/L (ref 22–32)
Calcium: 8.9 mg/dL (ref 8.9–10.3)
Chloride: 103 mmol/L (ref 98–111)
Creatinine, Ser: 0.64 mg/dL (ref 0.44–1.00)
GFR, Estimated: >60 mL/min (ref >60)
Glucose, Bld: 205 mg/dL — ABNORMAL HIGH (ref 70–99)
Potassium: 3.3 mmol/L — ABNORMAL LOW (ref 3.5–5.1)
Sodium: 137 mmol/L (ref 135–145)

## 2024-06-30 LAB — HEMOGLOBIN A1C
Hgb A1c MFr Bld: 7.1 % — ABNORMAL HIGH (ref 4.8–5.6)
Mean Plasma Glucose: 157 mg/dL

## 2024-07-01 ENCOUNTER — Encounter: Payer: Self-pay | Admitting: Family Medicine

## 2024-07-01 ENCOUNTER — Telehealth: Payer: Self-pay

## 2024-07-01 ENCOUNTER — Inpatient Hospital Stay: Attending: Oncology

## 2024-07-01 DIAGNOSIS — J309 Allergic rhinitis, unspecified: Secondary | ICD-10-CM

## 2024-07-01 DIAGNOSIS — D519 Vitamin B12 deficiency anemia, unspecified: Secondary | ICD-10-CM | POA: Insufficient documentation

## 2024-07-01 DIAGNOSIS — D509 Iron deficiency anemia, unspecified: Secondary | ICD-10-CM

## 2024-07-01 MED ORDER — FLUTICASONE PROPIONATE 50 MCG/ACT NA SUSP: 2.0000 | Freq: Every day | NASAL | 6 refills | Status: AC

## 2024-07-01 MED ORDER — CYANOCOBALAMIN 1000 MCG/ML IJ SOLN
1000.0000 ug | INTRAMUSCULAR | Status: DC
  Administered 2024-07-01: 1000 ug via INTRAMUSCULAR
  Filled 2024-07-01: qty 1

## 2024-07-01 NOTE — Telephone Encounter (Signed)
 Copied from CRM [id number]. Topic: General - Other >> Jul 01, 2024  8:10 AM Rosaria BRAVO wrote: Reason for CRM: Pt was identified as needed a statin, recommended by Doyal from [facility].   Best contact: [phone]

## 2024-07-02 MED ORDER — CEFAZOLIN SODIUM-DEXTROSE 2-4 GM/100ML-% IV SOLN
2.0000 g | INTRAVENOUS | Status: AC
  Administered 2024-07-03: 2 g via INTRAVENOUS

## 2024-07-02 MED ORDER — TRANEXAMIC ACID-NACL 1000-0.7 MG/100ML-% IV SOLN
1000.0000 mg | INTRAVENOUS | Status: AC
  Administered 2024-07-03: 1000 mg via INTRAVENOUS

## 2024-07-02 MED ORDER — LACTATED RINGERS IV SOLN: INTRAVENOUS | Status: DC

## 2024-07-02 MED ORDER — CHLORHEXIDINE GLUCONATE 0.12 % MT SOLN
15.0000 mL | Freq: Once | OROMUCOSAL | Status: AC
  Administered 2024-07-03: 15 mL via OROMUCOSAL

## 2024-07-02 MED ORDER — ORAL CARE MOUTH RINSE: 15.0000 mL | Freq: Once | OROMUCOSAL | Status: AC

## 2024-07-02 MED ORDER — DEXAMETHASONE SODIUM PHOSPHATE 10 MG/ML IJ SOLN
8.0000 mg | Freq: Once | INTRAMUSCULAR | Status: AC
  Administered 2024-07-03: 10 mg via INTRAVENOUS

## 2024-07-02 NOTE — Telephone Encounter (Signed)
 No recent lipid panel on file.  I would recommend that she make an appoint with Dr. Edman to have her lipid panel checked and discuss statin therapy if needed.

## 2024-07-03 ENCOUNTER — Other Ambulatory Visit: Payer: Self-pay

## 2024-07-03 ENCOUNTER — Ambulatory Visit

## 2024-07-03 ENCOUNTER — Ambulatory Visit: Admitting: Certified Registered Nurse Anesthetist

## 2024-07-03 ENCOUNTER — Ambulatory Visit
Admission: RE | Admit: 2024-07-03 | Discharge: 2024-07-03 | Disposition: A | Discharge status: Home / Self Care | Attending: Orthopedic Surgery | Admitting: Orthopedic Surgery

## 2024-07-03 ENCOUNTER — Encounter: Payer: Self-pay | Admitting: Urgent Care

## 2024-07-03 ENCOUNTER — Inpatient Hospital Stay: Payer: Medicare Other

## 2024-07-03 ENCOUNTER — Encounter: Payer: Self-pay | Admitting: Orthopedic Surgery

## 2024-07-03 ENCOUNTER — Encounter
Admission: RE | Disposition: A | Discharge status: Home / Self Care | Payer: Self-pay | Source: Home / Self Care | Attending: Orthopedic Surgery

## 2024-07-03 DIAGNOSIS — G8929 Other chronic pain: Secondary | ICD-10-CM | POA: Insufficient documentation

## 2024-07-03 DIAGNOSIS — M1711 Unilateral primary osteoarthritis, right knee: Secondary | ICD-10-CM | POA: Diagnosis not present

## 2024-07-03 DIAGNOSIS — M1990 Unspecified osteoarthritis, unspecified site: Secondary | ICD-10-CM | POA: Insufficient documentation

## 2024-07-03 DIAGNOSIS — Z6839 Body mass index (BMI) 39.0-39.9, adult: Secondary | ICD-10-CM | POA: Diagnosis not present

## 2024-07-03 DIAGNOSIS — E66813 Obesity, class 3: Secondary | ICD-10-CM | POA: Insufficient documentation

## 2024-07-03 DIAGNOSIS — E119 Type 2 diabetes mellitus without complications: Secondary | ICD-10-CM | POA: Insufficient documentation

## 2024-07-03 DIAGNOSIS — K219 Gastro-esophageal reflux disease without esophagitis: Secondary | ICD-10-CM | POA: Diagnosis not present

## 2024-07-03 DIAGNOSIS — R937 Abnormal findings on diagnostic imaging of other parts of musculoskeletal system: Secondary | ICD-10-CM | POA: Diagnosis not present

## 2024-07-03 DIAGNOSIS — E039 Hypothyroidism, unspecified: Secondary | ICD-10-CM | POA: Diagnosis not present

## 2024-07-03 DIAGNOSIS — I10 Essential (primary) hypertension: Secondary | ICD-10-CM | POA: Insufficient documentation

## 2024-07-03 DIAGNOSIS — M84461A Pathological fracture, right tibia, initial encounter for fracture: Secondary | ICD-10-CM | POA: Insufficient documentation

## 2024-07-03 DIAGNOSIS — M2341 Loose body in knee, right knee: Secondary | ICD-10-CM | POA: Insufficient documentation

## 2024-07-03 DIAGNOSIS — F418 Other specified anxiety disorders: Secondary | ICD-10-CM | POA: Diagnosis not present

## 2024-07-03 DIAGNOSIS — J45909 Unspecified asthma, uncomplicated: Secondary | ICD-10-CM | POA: Diagnosis not present

## 2024-07-03 DIAGNOSIS — S83241A Other tear of medial meniscus, current injury, right knee, initial encounter: Secondary | ICD-10-CM | POA: Diagnosis not present

## 2024-07-03 DIAGNOSIS — S82141A Displaced bicondylar fracture of right tibia, initial encounter for closed fracture: Secondary | ICD-10-CM | POA: Diagnosis not present

## 2024-07-03 DIAGNOSIS — F17200 Nicotine dependence, unspecified, uncomplicated: Secondary | ICD-10-CM | POA: Diagnosis not present

## 2024-07-03 DIAGNOSIS — F17210 Nicotine dependence, cigarettes, uncomplicated: Secondary | ICD-10-CM | POA: Diagnosis not present

## 2024-07-03 DIAGNOSIS — D649 Anemia, unspecified: Secondary | ICD-10-CM | POA: Insufficient documentation

## 2024-07-03 DIAGNOSIS — Z7985 Long-term (current) use of injectable non-insulin antidiabetic drugs: Secondary | ICD-10-CM | POA: Insufficient documentation

## 2024-07-03 DIAGNOSIS — Z01812 Encounter for preprocedural laboratory examination: Secondary | ICD-10-CM

## 2024-07-03 HISTORY — PX: KNEE ARTHROSCOPY WITH MEDIAL MENISECTOMY: SHX5651

## 2024-07-03 HISTORY — PX: KNEE ARTHROSCOPY WITH SUBCHONDROPLASTY: SHX6732

## 2024-07-03 LAB — GLUCOSE, CAPILLARY
Glucose-Capillary: 143 mg/dL — ABNORMAL HIGH (ref 70–99)
Glucose-Capillary: 186 mg/dL — ABNORMAL HIGH (ref 70–99)

## 2024-07-03 LAB — POCT PREGNANCY, URINE: Preg Test, Ur: NEGATIVE

## 2024-07-03 SURGERY — ARTHROSCOPY, KNEE, WITH MEDIAL MENISCECTOMY
Anesthesia: General | Site: Knee | Laterality: Right

## 2024-07-03 MED ORDER — BUPIVACAINE HCL (PF) 0.5 % IJ SOLN
INTRAMUSCULAR | Status: DC | PRN
  Administered 2024-07-03: 6 mL

## 2024-07-03 MED ORDER — MIDAZOLAM HCL 2 MG/2ML IJ SOLN
INTRAMUSCULAR | Status: DC | PRN
  Administered 2024-07-03: 2 mg via INTRAVENOUS

## 2024-07-03 MED ORDER — ONDANSETRON HCL 4 MG/2ML IJ SOLN
INTRAMUSCULAR | Status: AC
Start: 2024-07-03 — End: 2024-07-03
  Filled 2024-07-03: qty 2

## 2024-07-03 MED ORDER — FENTANYL CITRATE (PF) 100 MCG/2ML IJ SOLN
INTRAMUSCULAR | Status: AC
  Filled 2024-07-03: qty 2

## 2024-07-03 MED ORDER — ONDANSETRON 4 MG PO TBDP: 4.0000 mg | ORAL_TABLET | Freq: Three times a day (TID) | ORAL | 0 refills | Status: DC | PRN

## 2024-07-03 MED ORDER — KETOROLAC TROMETHAMINE 30 MG/ML IJ SOLN
INTRAMUSCULAR | Status: DC | PRN
  Administered 2024-07-03: 30 mg via INTRAMUSCULAR

## 2024-07-03 MED ORDER — FENTANYL CITRATE (PF) 100 MCG/2ML IJ SOLN
INTRAMUSCULAR | Status: AC
Start: 2024-07-03 — End: 2024-07-03
  Filled 2024-07-03: qty 2

## 2024-07-03 MED ORDER — OXYCODONE HCL 5 MG PO TABS
ORAL_TABLET | ORAL | Status: AC
Start: 2024-07-03 — End: 2024-07-03
  Filled 2024-07-03: qty 1

## 2024-07-03 MED ORDER — LIDOCAINE-EPINEPHRINE 1 %-1:100000 IJ SOLN
INTRAMUSCULAR | Status: AC
  Filled 2024-07-03: qty 1

## 2024-07-03 MED ORDER — DEXAMETHASONE SODIUM PHOSPHATE 10 MG/ML IJ SOLN
INTRAMUSCULAR | Status: AC
  Filled 2024-07-03: qty 1

## 2024-07-03 MED ORDER — ACETAMINOPHEN 500 MG PO TABS: 1000.0000 mg | ORAL_TABLET | Freq: Three times a day (TID) | ORAL | 2 refills | Status: DC

## 2024-07-03 MED ORDER — KETAMINE HCL 50 MG/5ML IJ SOSY
PREFILLED_SYRINGE | INTRAMUSCULAR | Status: DC | PRN
Start: 2024-07-03 — End: 2024-07-03
  Administered 2024-07-03: 20 mg via INTRAVENOUS
  Administered 2024-07-03: 10 mg via INTRAVENOUS
  Administered 2024-07-03: 20 mg via INTRAVENOUS

## 2024-07-03 MED ORDER — IBUPROFEN 800 MG PO TABS: 800.0000 mg | ORAL_TABLET | Freq: Three times a day (TID) | ORAL | 0 refills | Status: AC

## 2024-07-03 MED ORDER — OXYCODONE HCL 5 MG/5ML PO SOLN: 5.0000 mg | Freq: Once | ORAL | Status: AC | PRN

## 2024-07-03 MED ORDER — LACTATED RINGERS IV SOLN: INTRAVENOUS | Status: DC

## 2024-07-03 MED ORDER — LACTATED RINGERS IR SOLN
Status: DC | PRN
  Administered 2024-07-03: 6000 mL

## 2024-07-03 MED ORDER — FENTANYL CITRATE (PF) 100 MCG/2ML IJ SOLN
INTRAMUSCULAR | Status: DC | PRN
  Administered 2024-07-03: 100 ug via INTRAVENOUS

## 2024-07-03 MED ORDER — MIDAZOLAM HCL 2 MG/2ML IJ SOLN
INTRAMUSCULAR | Status: AC
  Filled 2024-07-03: qty 2

## 2024-07-03 MED ORDER — DEXMEDETOMIDINE HCL IN NACL 80 MCG/20ML IV SOLN
INTRAVENOUS | Status: DC | PRN
  Administered 2024-07-03: 12 ug via INTRAVENOUS
  Administered 2024-07-03: 20 ug via INTRAVENOUS

## 2024-07-03 MED ORDER — ACETAMINOPHEN 10 MG/ML IV SOLN
INTRAVENOUS | Status: AC
  Filled 2024-07-03: qty 100

## 2024-07-03 MED ORDER — ONDANSETRON HCL 4 MG/2ML IJ SOLN
INTRAMUSCULAR | Status: DC | PRN
  Administered 2024-07-03: 4 mg via INTRAVENOUS

## 2024-07-03 MED ORDER — MIDAZOLAM HCL 2 MG/2ML IJ SOLN
2.0000 mg | Freq: Once | INTRAMUSCULAR | Status: AC
  Administered 2024-07-03: 1 mg via INTRAVENOUS

## 2024-07-03 MED ORDER — LIDOCAINE-EPINEPHRINE 1 %-1:100000 IJ SOLN
INTRAMUSCULAR | Status: DC | PRN
  Administered 2024-07-03: 6 mL

## 2024-07-03 MED ORDER — OXYCODONE HCL 5 MG PO TABS
5.0000 mg | ORAL_TABLET | Freq: Once | ORAL | Status: AC | PRN
  Administered 2024-07-03: 5 mg via ORAL

## 2024-07-03 MED ORDER — ONDANSETRON HCL 4 MG/2ML IJ SOLN: 4.0000 mg | Freq: Once | INTRAMUSCULAR | Status: DC | PRN

## 2024-07-03 MED ORDER — ACETAMINOPHEN 10 MG/ML IV SOLN: 1000.0000 mg | Freq: Once | INTRAVENOUS | Status: DC | PRN

## 2024-07-03 MED ORDER — ASPIRIN 325 MG PO TBEC: 325.0000 mg | DELAYED_RELEASE_TABLET | Freq: Every day | ORAL | 0 refills | Status: AC

## 2024-07-03 MED ORDER — BUPIVACAINE HCL (PF) 0.5 % IJ SOLN
INTRAMUSCULAR | Status: AC
  Filled 2024-07-03: qty 30

## 2024-07-03 MED ORDER — FENTANYL CITRATE (PF) 100 MCG/2ML IJ SOLN
25.0000 ug | INTRAMUSCULAR | Status: DC | PRN
  Administered 2024-07-03 (×2): 25 ug via INTRAVENOUS

## 2024-07-03 MED ORDER — HYDROMORPHONE HCL 1 MG/ML IJ SOLN
INTRAMUSCULAR | Status: AC
  Filled 2024-07-03: qty 1

## 2024-07-03 MED ORDER — TRANEXAMIC ACID-NACL 1000-0.7 MG/100ML-% IV SOLN
INTRAVENOUS | Status: AC
Start: 2024-07-03 — End: 2024-07-03
  Filled 2024-07-03: qty 100

## 2024-07-03 MED ORDER — PROPOFOL 10 MG/ML IV BOLUS
INTRAVENOUS | Status: DC | PRN
  Administered 2024-07-03: 100 mg via INTRAVENOUS

## 2024-07-03 MED ORDER — HYDROMORPHONE HCL 1 MG/ML IJ SOLN
INTRAMUSCULAR | Status: DC | PRN
  Administered 2024-07-03: 1 mg via INTRAVENOUS

## 2024-07-03 MED ORDER — KETAMINE HCL 50 MG/5ML IJ SOSY
PREFILLED_SYRINGE | INTRAMUSCULAR | Status: AC
Start: 2024-07-03 — End: 2024-07-03
  Filled 2024-07-03: qty 5

## 2024-07-03 MED ORDER — ACETAMINOPHEN 10 MG/ML IV SOLN
INTRAVENOUS | Status: DC | PRN
  Administered 2024-07-03: 1000 mg via INTRAVENOUS

## 2024-07-03 MED ORDER — LIDOCAINE HCL URETHRAL/MUCOSAL 2 % EX GEL
CUTANEOUS | Status: DC | PRN
  Administered 2024-07-03: 1 via TOPICAL

## 2024-07-03 MED ORDER — MIDAZOLAM HCL 2 MG/2ML IJ SOLN
INTRAMUSCULAR | Status: AC
Start: 2024-07-03 — End: 2024-07-03
  Filled 2024-07-03: qty 2

## 2024-07-03 MED ORDER — CHLORHEXIDINE GLUCONATE 0.12 % MT SOLN
OROMUCOSAL | Status: AC
Start: 2024-07-03 — End: 2024-07-03
  Filled 2024-07-03: qty 15

## 2024-07-03 MED ORDER — HYDROCODONE-ACETAMINOPHEN 5-325 MG PO TABS: 1.0000 | ORAL_TABLET | ORAL | 0 refills | Status: DC | PRN

## 2024-07-03 MED ORDER — CEFAZOLIN SODIUM-DEXTROSE 2-4 GM/100ML-% IV SOLN
INTRAVENOUS | Status: AC
Start: 2024-07-03 — End: 2024-07-03
  Filled 2024-07-03: qty 100

## 2024-07-03 SURGICAL SUPPLY — 39 items
ADAPTER IRRIG TUBE 2 SPIKE SOL (ADAPTER) ×2 IMPLANT
BLADE FULL RADIUS 3.5 (BLADE) IMPLANT
BLADE SHAVER 4.5X7 STR FR (MISCELLANEOUS) IMPLANT
BLADE SURG SZ11 CARB STEEL (BLADE) ×1 IMPLANT
BNDG COHESIVE 6X5 TAN ST LF (GAUZE/BANDAGES/DRESSINGS) ×1 IMPLANT
BNDG ELASTIC 6INX 5YD STR LF (GAUZE/BANDAGES/DRESSINGS) ×1 IMPLANT
BNDG ESMARCH 6X12 STRL LF (GAUZE/BANDAGES/DRESSINGS) ×1 IMPLANT
BUR BR 5.5 WIDE MOUTH (BURR) IMPLANT
CHLORAPREP W/TINT 26 (MISCELLANEOUS) ×1 IMPLANT
COOLER ICEMAN CLASSIC (MISCELLANEOUS) ×1 IMPLANT
COVER LIGHT HANDLE STERIS (MISCELLANEOUS) ×2 IMPLANT
CUFF TRNQT CYL 24X4X16.5-23 (TOURNIQUET CUFF) IMPLANT
CUFF TRNQT CYL 30X4X21-28X (TOURNIQUET CUFF) IMPLANT
DRAPE ARTHROSCOPY W/POUCH 90 (DRAPES) ×1 IMPLANT
DRAPE IMP U-DRAPE 54X76 (DRAPES) ×1 IMPLANT
ELECTRODE REM PT RTRN 9FT ADLT (ELECTROSURGICAL) IMPLANT
GAUZE SPONGE 4X4 12PLY STRL (GAUZE/BANDAGES/DRESSINGS) ×1 IMPLANT
GLOVE BIOGEL PI IND STRL 8 (GLOVE) ×1 IMPLANT
GLOVE SURG ORTHO 8.0 STRL STRW (GLOVE) ×2 IMPLANT
GOWN SRG LRG LVL 4 IMPRV REINF (GOWNS) ×1 IMPLANT
GOWN SRG XL LONG LVL 3 NONREIN (GOWNS) ×2 IMPLANT
IV LR IRRIG 3000ML ARTHROMATIC (IV SOLUTION) ×2 IMPLANT
KIT KNEE SCP 5 11X120 SD (Knees) IMPLANT
KIT TURNOVER KIT A (KITS) ×1 IMPLANT
MANIFOLD NEPTUNE II (INSTRUMENTS) ×1 IMPLANT
MAT ABSORB FLUID 56X50 GRAY (MISCELLANEOUS) ×1 IMPLANT
PACK ARTHROSCOPY KNEE (MISCELLANEOUS) ×1 IMPLANT
PAD ABD DERMACEA PRESS 5X9 (GAUZE/BANDAGES/DRESSINGS) ×2 IMPLANT
PAD COLD UNI WRAP-ON (PAD) ×1 IMPLANT
PADDING CAST COTTON 6X4 STRL (CAST SUPPLIES) ×1 IMPLANT
SUT VIC AB 2-0 CT2 27 (SUTURE) IMPLANT
SUTURE EHLN 3-0 FS-10 30 BLK (SUTURE) ×1 IMPLANT
SUTURE MNCRL 4-0 27XMF (SUTURE) ×1 IMPLANT
TOWEL OR 17X26 4PK STRL BLUE (TOWEL DISPOSABLE) IMPLANT
TRAP FLUID SMOKE EVACUATOR (MISCELLANEOUS) IMPLANT
TUBE SET DOUBLEFLO INFLOW (TUBING) ×1 IMPLANT
TUBE SET DOUBLEFLO OUTFLOW (TUBING) ×1 IMPLANT
WAND WEREWOLF FLOW 90D (MISCELLANEOUS) ×1 IMPLANT
WATER STERILE IRR 500ML POUR (IV SOLUTION) ×1 IMPLANT

## 2024-07-03 NOTE — Op Note (Addendum)
 Operative Note    SURGERY DATE: 07/03/2024    PRE-OP DIAGNOSIS:  1.  Right subchondral insufficiency fracture of medial tibial plateau 2.  Right medial meniscus tear 3.  Right knee tricompartmental degenerative changes   POST-OP DIAGNOSIS:  1.  Right subchondral insufficiency fracture of medial tibial plateau 2.  Right knee multiple loose bodies 3.  Right knee tricompartmental degenerative changes   PROCEDURES:  1. Right knee arthroscopically assisted subchondroplasty of medial tibial plateau (treatment of medial tibial plateau fracture) 2. Right knee arthroscopy, loose body removal 3. Right knee tricompartmental chondroplasty  SURGEON: Earnestine HILARIO Blanch, MD  ASSISTANT: DOROTHA Krystal Doyne, PA    ANESTHESIA: Gen   ESTIMATED BLOOD LOSS: minimal   TOTAL IV FLUIDS: per anesthesia   INDICATION(S):  Amanda Webb is a 53 y.o. female with signs and symptoms as well as MRI finding of bone marrow edema and subchondral insufficiency fracture of the medial tibial plateau with possible medial meniscus tear. She underwent a trial of nonsurgical management in the form of activity modifications, medical management, corticosteroid injection, and unloader brace without improvement in symptoms.  Given mild degenerative changes on radiographs with relatively well-maintained joint spaces, and after discussion of risks, benefits, and alternatives to surgery, the patient elected to proceed.  The patient understands that she may still have some persistent pain and/are end up needing an arthroplasty type procedure in the future.   OPERATIVE FINDINGS:    Examination under anesthesia: A careful examination under anesthesia was performed.  Passive range of motion was: Hyperextension: 1.  Extension: 0.  Flexion: 125.  Lachman: normal. Pivot Shift: normal.  Posterior drawer: normal.  Varus stability in full extension: normal.  Varus stability in 30 degrees of flexion: normal.  Valgus stability in full extension:  normal.  Valgus stability in 30 degrees of flexion: normal.   Intra-operative findings: A thorough arthroscopic examination of the knee was performed.  The findings are: 1. Suprapatellar pouch: Multiple loose bodies, largest measuring 12 x 8 mm 2. Undersurface of median ridge: Regions of grade 4 degenerative changes 3. Medial patellar facet: Grade 4 degenerative changes 4. Lateral patellar facet: Grade 1-2 degenerative change 5. Trochlea: Normal 6. Lateral gutter/popliteus tendon: Multiple, small loose bodies 7. Hoffa's fat pad: Inflamed 8. Medial gutter/plica: Multiple loose bodies 9. ACL: Normal 10. PCL: Normal 11. Medial meniscus: No new tear.  Evidence of prior partial meniscectomy consisting of undersurface resection of the posterior horn and body with approximately 50% of the meniscus body width remaining circumferentially 12. Medial compartment cartilage: Focal grade 2-3 degenerative changes of the tibial plateau and femoral condyle 13. Lateral meniscus: Normal 14. Lateral compartment cartilage: Small, focal grade 4 chondral defect of the lateral femoral condyle and grade 4 chondral defect of the central tibial plateau   OPERATIVE REPORT:     I identified the patient in the pre-operative holding area. I marked the operative knee with my initials. I reviewed the risks and benefits of the proposed surgical intervention and the patient wished to proceed.  The patient was transferred to the operative suite and placed in the supine position with all bony prominences padded.  Anesthesia was administered. Appropriate IV antibiotics were administered prior to incision. The extremity was then prepped and draped in standard fashion. A time out was performed confirming the correct extremity, correct patient, and correct procedure.   Arthroscopy portals were marked. Local anesthetic was injected to the planned portal sites. The anterolateral portal was established with an 11 blade. The arthroscope  was placed in the anterolateral portal and then into the suprapatellar pouch. Next the medial portal was established under needle localization.  A diagnostic knee scope was completed with the above findings.  There was no notable meniscus tear.  However, there were multiple (dozens) of small, cartilaginous loose bodies located diffusely about the joint including in the patellofemoral joint, medial and lateral gutters, and medial and lateral compartments about the posterior horn of the menisci.  These were removed with an oscillating shaver.  There were 3 larger loose bodies, the largest measuring approximately 12 x 8 mm located in the suprapatellar pouch in the patellofemoral compartment.  The medial portal was enlarged slightly and a pituitary rongeur was utilized to remove each of these loose bodies.  These also appeared to be cartilaginous in nature.  Next, A chondroplasty was performed of the medial compartment, lateral compartment, and patellofemoral compartment such that there were stable cartilage edges without any loose fragments of cartilage.  An ArthroCare wand was used to debride the synovitic tissue about the patellofemoral space.  The arthroscope was withdrawn from the joint.  Subchondroplasty to treat the medial tibial plateau subchondral insufficiency fracture was performed next.  Using fluoroscopic guidance and correlation with the patient's MRI, a small stab incision was made over the medial tibial plateau.  A trocar with cannula was drilled into the site of the subchondral insufficiency fracture such that all of the flutes were within bone.  Calcium phosphate mixture was appropriately mixed and 5cc of calcium phosphate were injected through the cannula into the medial tibial plateau subchondral insufficiency fracture.  Appropriate filling was seen on fluoroscopy.  Cannula was left in place for approximately 10 minutes until calcium phosphate had appropriately set.   Arthroscope was placed back  into the joint.  There was no extravasation of the calcium phosphate material into the joint.  Additionally, there were no further notable loose bodies.  Arthroscopic fluid was removed.   The incisions were closed with 3-0 Nylon suture.  Local anesthetic was injected.  Sterile dressings included Xeroform, 4x4s, Sof-Rol, and Bias wrap. A Polarcare was placed.  The patient was then awakened and taken to the PACU hemodynamically stable without complication.   Of note, assistance from a PA was essential to performing the surgery.  PA was present for the entire surgery.  PA assisted with patient positioning preoperatively and intraoperatively, instrumentation, and wound closure. The surgery would have been more difficult and had longer operative time without PA assistance.      POSTOPERATIVE PLAN: The patient will be discharged home today once they meet PACU criteria. Aspirin  325 mg daily was prescribed for 2 weeks for DVT prophylaxis.  Physical therapy will start  in 3 to 7 days. Weight-bearing as tolerated. Follow up in 2 weeks per protocol.

## 2024-07-03 NOTE — Transfer of Care (Signed)
 Immediate Anesthesia Transfer of Care Note  Patient: Amanda Webb  Procedure(s) Performed: ARTHROSCOPY, KNEE, WITH MEDIAL MENISCECTOMY (Right: Knee) ARTHROSCOPY, KNEE, WITH SUBCHONDROPLASTY (Right: Knee)  Patient Location: PACU  Anesthesia Type:General  Level of Consciousness: drowsy and patient cooperative  Airway & Oxygen Therapy: Patient Spontanous Breathing and Patient connected to face mask oxygen  Post-op Assessment: Report given to RN and Post -op Vital signs reviewed and stable  Post vital signs: stable, HOB 30 degrees. Nasal trumpet and oxygen mask remain. VSS  Last Vitals:  Vitals Value Taken Time  BP 109/57 07/03/24 16:01  Temp 36.1 C 07/03/24 16:01  Pulse 58 07/03/24 16:04  Resp 13 07/03/24 16:04  SpO2 96 % 07/03/24 16:04  Vitals shown include unfiled device data.  Last Pain:  Vitals:   07/03/24 1601  TempSrc:   PainSc: Asleep         Complications: No notable events documented.

## 2024-07-03 NOTE — Discharge Instructions (Addendum)
Arthroscopic Knee Surgery   Post-Op Instructions   1. Bracing or crutches: Crutches will be provided at the time of discharge from the surgery center if you do not already have them.   2. Ice: You may be provided with a device (Polar Care) that allows you to ice the affected area effectively. Otherwise you can ice manually.    3. Driving:  Plan on not driving for at least one week. Please note that you are advised NOT to drive while taking narcotic pain medications as you may be impaired and unsafe to drive.   4. Activity: Ankle pumps several times an hour while awake to prevent blood clots. Weight bearing: as tolerated. Use crutches for as needed (usually ~1 week or less) until pain allows you to ambulate without a limp. Bending and straightening the knee is unlimited. Elevate knee above heart level as much as possible for one week. Avoid standing more than 5 minutes (consecutively) for the first week.  Avoid long distance travel for 2 weeks.   5. Medications:  - You have been provided a prescription for narcotic pain medicine. After surgery, take 1-2 narcotic tablets every 4 hours if needed for severe pain.  - You may take up to 3000mg/day of tylenol (acetaminophen). You can take 1000mg 3x/day. Please check your narcotic. If you have acetaminophen in your narcotic (each tablet will be 325mg), be careful not to exceed a total of 3000mg/day of acetaminophen.  - A prescription for anti-nausea medication will be provided in case the narcotic medicine causes nausea - take 1 tablet every 6 hours only if nauseated.  - Take ibuprofen 800 mg every 8 hours WITH food to reduce post-operative knee swelling. DO NOT STOP IBUPROFEN POST-OP UNTIL INSTRUCTED TO DO SO at first post-op office visit (10-14 days after surgery). However, please discontinue if you have any abdominal discomfort after taking this.  - Take enteric coated aspirin 325 mg once daily for 2 weeks to prevent blood clots.   6. Bandages: The  physical therapist should change the bandages at the first post-op appointment. If needed, the dressing supplies have been provided to you.   7. Physical Therapy: 1-2 times per week for 6 weeks. Therapy typically starts on post operative Day 3 or 4. You have been provided an order for physical therapy. The therapist will provide home exercises.   8. Work: May return to full work usually around 2 weeks after 1st post-operative visit. May do light duty/desk job in approximately 1-2 weeks when off of narcotics, pain is well-controlled, and swelling has decreased. Labor intensive jobs may require 4-6 weeks to return.      9. Post-Op Appointments: Your first post-op appointment will be with Dr. Fronnie Urton in approximately 2 weeks time.    If you find that they have not been scheduled please call the [facility] front desk at [phone].  

## 2024-07-03 NOTE — H&P (Signed)
 Paper H&P to be scanned into permanent record. H&P reviewed. No significant changes noted.

## 2024-07-03 NOTE — Anesthesia Postprocedure Evaluation (Signed)
 Anesthesia Post Note  Patient: Amanda Webb  Procedure(s) Performed: ARTHROSCOPY, KNEE, WITH MEDIAL MENISCECTOMY (Right: Knee) ARTHROSCOPY, KNEE, WITH SUBCHONDROPLASTY (Right: Knee)  Patient location during evaluation: PACU Anesthesia Type: General Level of consciousness: awake and alert, oriented and patient cooperative Pain management: pain level controlled Vital Signs Assessment: post-procedure vital signs reviewed and stable Respiratory status: spontaneous breathing, nonlabored ventilation and respiratory function stable Cardiovascular status: blood pressure returned to baseline and stable Postop Assessment: adequate PO intake Anesthetic complications: no   There were no known notable events for this encounter.   Last Vitals:  Vitals:   07/03/24 1635 07/03/24 1645  BP:  (!) 142/74  Pulse: 79 79  Resp: 14 17  Temp:    SpO2: 95% 94%    Last Pain:  Vitals:   07/03/24 1645  TempSrc:   PainSc: 6                  Jesly Hartmann

## 2024-07-03 NOTE — Anesthesia Procedure Notes (Addendum)
 Procedure Name: LMA Insertion Date/Time: 07/03/2024 3:09 PM  Performed by: Norleen Alberta HERO., CRNAPre-anesthesia Checklist: Patient identified, Patient being monitored, Timeout performed, Emergency Drugs available and Suction available Patient Re-evaluated:Patient Re-evaluated prior to induction Oxygen Delivery Method: Circle system utilized Preoxygenation: Pt paniced with any touch of mask. Removed mask and asked pt to breath deeply until loss of lash and LMA immediately placed without desats. Induction Type: IV induction Ventilation: Mask ventilation without difficulty LMA: LMA inserted LMA Size: 3.0 Tube type: Oral Number of attempts: 1 Placement Confirmation: positive ETCO2 and breath sounds checked- equal and bilateral Tube secured with: Tape Dental Injury: Teeth and Oropharynx as per pre-operative assessment

## 2024-07-03 NOTE — Anesthesia Preprocedure Evaluation (Addendum)
 Anesthesia Evaluation  Patient identified by MRN, date of birth, ID band Patient awake    Reviewed: Allergy & Precautions, NPO status , Patient's Chart, lab work & pertinent test results  History of Anesthesia Complications Negative for: history of anesthetic complications  Airway Mallampati: I   Neck ROM: Full    Dental  (+) Missing   Pulmonary asthma , Current Smoker (1 ppd)Patient did not abstain from smoking.   Pulmonary exam normal breath sounds clear to auscultation       Cardiovascular hypertension, Normal cardiovascular exam Rhythm:Regular Rate:Normal  ECG 06/30/24:  Normal sinus rhythm Nonspecific T wave abnormality Prolonged QT Abnormal ECG When compared with ECG of 19-Jul-2020 20:55, No significant change was found   Neuro/Psych  Headaches PSYCHIATRIC DISORDERS Anxiety Depression    Chronic pain    GI/Hepatic ,GERD  ,,  Endo/Other  diabetes, Type 2Hypothyroidism  Class 3 obesity  Renal/GU      Musculoskeletal  (+) Arthritis ,    Abdominal   Peds  Hematology  (+) Blood dyscrasia, anemia   Anesthesia Other Findings Last dose of Mounjaro  3 weeks ago.  Reproductive/Obstetrics                              Anesthesia Physical Anesthesia Plan  ASA: 3  Anesthesia Plan: General   Post-op Pain Management:    Induction: Intravenous  PONV Risk Score and Plan: 2 and Ondansetron , Dexamethasone  and Treatment may vary due to age or medical condition  Airway Management Planned: LMA  Additional Equipment:   Intra-op Plan:   Post-operative Plan: Extubation in OR  Informed Consent: I have reviewed the patients History and Physical, chart, labs and discussed the procedure including the risks, benefits and alternatives for the proposed anesthesia with the patient or authorized representative who has indicated his/her understanding and acceptance.     Dental advisory  given  Plan Discussed with: CRNA  Anesthesia Plan Comments: (Patient consented for risks of anesthesia including but not limited to:  - adverse reactions to medications - damage to eyes, teeth, lips or other oral mucosa - nerve damage due to positioning  - sore throat or hoarseness - damage to heart, brain, nerves, lungs, other parts of body or loss of life  Informed patient about role of CRNA in peri- and intra-operative care.  Patient voiced understanding.)         Anesthesia Quick Evaluation

## 2024-07-06 ENCOUNTER — Encounter: Payer: Self-pay | Admitting: Family Medicine

## 2024-07-06 ENCOUNTER — Encounter: Payer: Self-pay | Admitting: Orthopedic Surgery

## 2024-07-06 DIAGNOSIS — G8929 Other chronic pain: Secondary | ICD-10-CM | POA: Diagnosis not present

## 2024-07-06 DIAGNOSIS — M25561 Pain in right knee: Secondary | ICD-10-CM | POA: Diagnosis not present

## 2024-07-09 ENCOUNTER — Other Ambulatory Visit: Payer: Self-pay | Admitting: Family Medicine

## 2024-07-09 DIAGNOSIS — E1169 Type 2 diabetes mellitus with other specified complication: Secondary | ICD-10-CM

## 2024-07-10 DIAGNOSIS — G8929 Other chronic pain: Secondary | ICD-10-CM | POA: Diagnosis not present

## 2024-07-10 DIAGNOSIS — M25561 Pain in right knee: Secondary | ICD-10-CM | POA: Diagnosis not present

## 2024-07-10 NOTE — Telephone Encounter (Signed)
 Requested medication (s) are due for refill today: yes  Requested medication (s) are on the active medication list: yes  Last refill:  05/13/24  Future visit scheduled: no  Notes to clinic:  Medication not assigned to a protocol, review manually.      Requested Prescriptions  Pending Prescriptions Disp Refills   MOUNJARO  5 MG/0.5ML Pen [Pharmacy Med Name: MOUNJARO  5MG /0.5ML INJ (4 PENS)] 2 mL 0    Sig: ADMINISTER 5 MG UNDER THE SKIN 1 TIME A WEEK     Off-Protocol Failed - 07/10/2024 12:38 PM      Failed - Medication not assigned to a protocol, review manually.      Passed - Valid encounter within last 12 months    Recent Outpatient Visits           3 months ago Chronic maxillary sinusitis   [facility] [place], Marsa PARAS, DO   3 months ago Non-recurrent acute suppurative otitis media of right ear without spontaneous rupture of tympanic membrane   [place] [facility] [place], Marsa PARAS, [place]

## 2024-07-15 DIAGNOSIS — S83241D Other tear of medial meniscus, current injury, right knee, subsequent encounter: Secondary | ICD-10-CM | POA: Diagnosis not present

## 2024-07-16 ENCOUNTER — Encounter: Payer: Self-pay | Admitting: Family Medicine

## 2024-07-16 ENCOUNTER — Other Ambulatory Visit: Payer: Self-pay

## 2024-07-16 DIAGNOSIS — E039 Hypothyroidism, unspecified: Secondary | ICD-10-CM

## 2024-07-16 MED ORDER — LEVOTHYROXINE SODIUM 50 MCG PO TABS: 50.0000 ug | ORAL_TABLET | Freq: Every day | ORAL | 1 refills | Status: DC

## 2024-07-21 ENCOUNTER — Ambulatory Visit: Admitting: Obstetrics and Gynecology

## 2024-07-22 ENCOUNTER — Ambulatory Visit (INDEPENDENT_AMBULATORY_CARE_PROVIDER_SITE_OTHER): Admitting: Obstetrics and Gynecology

## 2024-07-22 ENCOUNTER — Encounter: Payer: Self-pay | Admitting: Obstetrics and Gynecology

## 2024-07-22 VITALS — BP 134/84 | HR 80 | Ht 67.0 in | Wt 246.1 lb

## 2024-07-22 DIAGNOSIS — Z01419 Encounter for gynecological examination (general) (routine) without abnormal findings: Secondary | ICD-10-CM

## 2024-07-22 DIAGNOSIS — Z1329 Encounter for screening for other suspected endocrine disorder: Secondary | ICD-10-CM

## 2024-07-22 DIAGNOSIS — Z1231 Encounter for screening mammogram for malignant neoplasm of breast: Secondary | ICD-10-CM

## 2024-07-22 DIAGNOSIS — R5383 Other fatigue: Secondary | ICD-10-CM | POA: Diagnosis not present

## 2024-07-22 DIAGNOSIS — D508 Other iron deficiency anemias: Secondary | ICD-10-CM | POA: Diagnosis not present

## 2024-07-22 DIAGNOSIS — Z1211 Encounter for screening for malignant neoplasm of colon: Secondary | ICD-10-CM

## 2024-07-22 DIAGNOSIS — N951 Menopausal and female climacteric states: Secondary | ICD-10-CM

## 2024-07-22 NOTE — Progress Notes (Signed)
 Patients presents for annual exam today. Patient states doing well on Depo, due for next injection in September. Due for mammogram and Cologuard, ordered. Annual labs are ordered. Up to date on pap smear. She states no other questions or concerns at this time.

## 2024-07-22 NOTE — Progress Notes (Signed)
 HPI:      [name] is a 53 y.o. [phone] who LMP was No LMP recorded. Patient has had an injection.  Subjective:   She presents today for her well woman visit.  She reports that she has been very tired lately but also states that she had knee surgery, is not taking her thyroid  medication as directed and her mother has been sick so she has been caring for her as well. She is currently using Depo-Provera  for cycle control and birth control.  She reports that she rarely has intercourse (1 time in the last 4 months).  She also complains of difficulty sleeping, being hot all the time, and having vaginal dryness.    Hx: The following portions of the patient's history were reviewed and updated as appropriate:             She  has a past medical history of Allergy, Anemia, Anxiety, Arrhythmia, Arthritis, Asthma, Cervical dysplasia, Cystitis, Depression, Dyspnea, GERD (gastroesophageal reflux disease), Gross hematuria, Headache, Heart murmur, History of methicillin resistant staphylococcus aureus (MRSA) (2013), HLD (hyperlipidemia), Hypertension, Hypothyroid, Lymphedema, Palpitations, Pernicious anemia, Pneumonia, Tobacco abuse, and Type 2 diabetes mellitus (HCC). She does not have any pertinent problems on file. She  has a past surgical history that includes Foot surgery (Right, 2013); Carpal tunnel release (Bilateral, 2011); Tonsillectomy (1979); Robotic assisted laparoscopic cholecystectomy (07/15/2020); Knee arthroscopy with medial menisectomy (Right, 07/11/2021); Knee arthroscopy with medial menisectomy (Right, 07/03/2024); and Knee arthroscopy with subchondroplasty (Right, 07/03/2024). Her family history includes Alcohol abuse in her mother; Cancer in her maternal grandmother; Depression in her father, mother, and paternal grandfather; Diabetes in her father and maternal grandmother; Drug abuse in her father and paternal grandfather; Kidney disease in her maternal grandfather; Lung cancer in her  mother; Mental illness in her father and mother; Ovarian cancer in her maternal grandmother. She  reports that she has been smoking cigarettes. She has a 32 pack-year smoking history. She has been exposed to tobacco smoke. She has never used smokeless tobacco. She reports current alcohol use of about 3.0 standard drinks of alcohol per week. She reports that she does not use drugs. She has a current medication list which includes the following prescription(s): accu-chek fastclix lancets, acetaminophen , albuterol , albuterol , albuterol , aripiprazole, atenolol , azelastine , cetirizine, cyanocobalamin , fluconazole , fluticasone , fluvoxamine, gabapentin , accu-chek guide, hydrocodone -acetaminophen , hydroxyzine , iron  sucrose 100 mg in sodium chloride  0.9 % 100 mL, ketoconazole , lamotrigine, levothyroxine , montelukast , mounjaro , multivitamin with minerals, nystatin  cream, ondansetron , pantoprazole , trelegy ellipta , and tretinoin , and the following Facility-Administered Medications: cyanocobalamin  and cyanocobalamin . She is allergic to bee venom, cat dander, dog epithelium, dog epithelium (canis lupus familiaris), dog fennel, dog fennel allergy skin test, dust mite extract, tetracyclines & related, lactose, milk (cow), milk protein, milk-related compounds, tape, and wound dressing adhesive.       Review of Systems:  Review of Systems  Constitutional: Denied constitutional symptoms, night sweats, recent illness, fatigue, fever, insomnia and weight loss.  Eyes: Denied eye symptoms, eye pain, photophobia, vision change and visual disturbance.  Ears/Nose/Throat/Neck: Denied ear, nose, throat or neck symptoms, hearing loss, nasal discharge, sinus congestion and sore throat.  Cardiovascular: Denied cardiovascular symptoms, arrhythmia, chest pain/pressure, edema, exercise intolerance, orthopnea and palpitations.  Respiratory: Denied pulmonary symptoms, asthma, pleuritic pain, productive sputum, cough, dyspnea and wheezing.   Gastrointestinal: Denied, gastro-esophageal reflux, melena, nausea and vomiting.  Genitourinary: See HPI for additional information.  Musculoskeletal: Denied musculoskeletal symptoms, stiffness, swelling, muscle weakness and myalgia.  Dermatologic: Denied dermatology symptoms, rash and scar.  Neurologic: Denied neurology symptoms, dizziness, headache, neck pain and syncope.  Psychiatric: Denied psychiatric symptoms, anxiety and depression.  Endocrine: See HPI for additional information.   Meds:   Current Outpatient Medications on File Prior to Visit  Medication Sig Dispense Refill   Accu-Chek FastClix Lancets MISC USE TO CHECK BLOOD SUGAR UP TO TWICE DAILY AS DIRECTED 102 each 5   acetaminophen  (TYLENOL ) 500 MG tablet Take 2 tablets (1,000 mg total) by mouth every 8 (eight) hours. 90 tablet 2   albuterol  (PROVENTIL ) (2.5 MG/3ML) 0.083% nebulizer solution Inhale 3ml (one vial) via nebulized route every four hours around the clock for 24 hours, followed by one vial every 6 hours for the next 24 hours. After 48 hours, take 3mL via neb every 6-8 hours PRN cough, wheeze (Patient taking differently: Take 2.5 mg by nebulization every 6 (six) hours as needed for wheezing or shortness of breath.) 150 mL 1   albuterol  (VENTOLIN  HFA) 108 (90 Base) MCG/ACT inhaler INHALE 1 TO 2 PUFFS BY MOUTH EVERY 4 TO 6 HOURS AS NEEDED FOR SHORTNESS OF BREATH 18 g 2   albuterol  (VENTOLIN  HFA) 108 (90 Base) MCG/ACT inhaler Inhale 2 puffs into the lungs every 6 (six) hours as needed for wheezing or shortness of breath. 8 g 0   ARIPiprazole (ABILIFY) 2 MG tablet Take 2 mg by mouth every morning.      atenolol  (TENORMIN ) 50 MG tablet TAKE 1 TABLET(50 MG) BY MOUTH DAILY 90 tablet 0   azelastine  (ASTELIN ) 0.1 % nasal spray Place 1 spray into both nostrils 2 (two) times daily. Use in each nostril as directed 30 mL 5   cetirizine (ZYRTEC) 10 MG tablet Take 10 mg by mouth daily with lunch.     Cyanocobalamin  (B-12 COMPLIANCE  INJECTION IJ) Inject 1 Dose as directed every 30 (thirty) days.     fluconazole  (DIFLUCAN ) 150 MG tablet Take 1 tablet (150 mg total) by mouth daily. 1 tablet 0   fluticasone  (FLONASE ) 50 MCG/ACT nasal spray Place 2 sprays into both nostrils daily. SHAKE LIQUID AND USE 2 SPRAYS IN EACH NOSTRIL EVERY DAY 9.9 mL 6   fluvoxaMINE (LUVOX) 100 MG tablet Take 150 mg by mouth every morning.      gabapentin  (NEURONTIN ) 100 MG capsule Take 200 mg by mouth daily.     glucose blood (ACCU-CHEK GUIDE) test strip CHECK BLOOD SUGAR UP TO EVERY DAY AS DIRECTED 200 strip 5   HYDROcodone -acetaminophen  (NORCO/VICODIN) 5-325 MG tablet Take 1-2 tablets by mouth every 4 (four) hours as needed for moderate pain (pain score 4-6). 10 tablet 0   hydrOXYzine  (VISTARIL ) 25 MG capsule TAKE 1 CAPSULE(25 MG) BY MOUTH EVERY 8 HOURS AS NEEDED (Patient not taking: Reported on 07/22/2024) 30 capsule 0   iron  sucrose 100 mg in sodium chloride  0.9 % 100 mL Inject 100 mg into the vein.     ketoconazole  (NIZORAL ) 2 % shampoo APPLY EXTERNALLY 2 TIMES A WEEK (Patient not taking: Reported on 06/24/2024) 120 mL 2   lamoTRIgine (LAMICTAL) 200 MG tablet Take 200 mg by mouth daily.     levothyroxine  (SYNTHROID ) 50 MCG tablet Take 1 tablet (50 mcg total) by mouth daily before breakfast. 90 tablet 1   montelukast  (SINGULAIR ) 10 MG tablet Take 1 tablet (10 mg total) by mouth at bedtime. 90 tablet 1   MOUNJARO  5 MG/0.5ML Pen Inject 5 mg into the skin once a week. 2 mL 0   Multiple Vitamin (MULTIVITAMIN WITH MINERALS) TABS tablet Take 1 tablet  by mouth daily.     nystatin  cream (MYCOSTATIN ) Apply 1 application. topically 2 (two) times daily as needed (rash). (Patient not taking: Reported on 06/24/2024) 30 g 5   ondansetron  (ZOFRAN -ODT) 4 MG disintegrating tablet Take 1 tablet (4 mg total) by mouth every 8 (eight) hours as needed for nausea or vomiting. 20 tablet 0   pantoprazole  (PROTONIX ) 20 MG tablet TAKE 1 TABLET(20 MG) BY MOUTH DAILY 90 tablet 1    TRELEGY ELLIPTA  100-62.5-25 MCG/ACT AEPB INHALE 1 PUFF INTO THE LUNGS DAILY 180 each 1   tretinoin  (RETIN-A ) 0.025 % cream APPLY EXTERNALLY TO THE AFFECTED AREA AT BEDTIME AS NEEDED 45 g 2   Current Facility-Administered Medications on File Prior to Visit  Medication Dose Route Frequency Provider Last Rate Last Admin   cyanocobalamin  (VITAMIN B12) injection 1,000 mcg  1,000 mcg Intramuscular Once Melanee Annah BROCKS, MD       cyanocobalamin  (VITAMIN B12) injection 1,000 mcg  1,000 mcg Intramuscular Q30 days Melanee Annah BROCKS, MD   1,000 mcg at 02/05/24 9090     Objective:     Vitals:   07/22/24 0924  BP: 134/84  Pulse: 80    Filed Weights   07/22/24 0924  Weight: 246 lb 1.6 oz (111.6 kg)              Patient has deferred examination today as she has no symptoms  Assessment:    [id number] Patient Active Problem List   Diagnosis Date Noted   Major depressive disorder, recurrent, in partial remission (HCC) 11/08/2020   Cough 10/21/2020   Status post laparoscopic cholecystectomy 07/28/2020   Morbid obesity (HCC) 07/05/2020   Hepatic steatosis 06/23/2020   Lymphedema 03/09/2020   Chronic venous insufficiency 03/09/2020   Bilateral lower extremity edema 02/16/2020   Female cystocele 10/21/2018   Urinary incontinence, mixed 10/21/2018   B12 deficiency 04/08/2018   Iron  deficiency anemia 03/14/2018   GAD (generalized anxiety disorder) 03/10/2018   Acute right-sided low back pain 10/28/2017   Tobacco abuse 05/09/2017   Multiple thyroid  nodules 12/28/2016   Simple chronic bronchitis (HCC) 12/13/2016   BMI 40.0-44.9, adult (HCC) 09/26/2016   HLD (hyperlipidemia) 09/26/2016   Vitamin D deficiency 09/26/2016   Type 2 diabetes mellitus with other specified complication (HCC) 06/17/2015   Candidiasis of skin and nail 02/21/2015   Candidal intertrigo 02/21/2015   Cellulitis of right lower extremity 02/18/2015   Benign cyst of right kidney 01/17/2015   Chronic sinusitis 11/15/2014    Whiplash injury 09/03/2014   Heart palpitations 04/02/2014   Palpitations 04/02/2014   Allergic rhinitis 12/31/2013   Spondylosis of lumbar region without myelopathy or radiculopathy 12/28/2013   Chronic low back pain 09/28/2013   Other fatigue 09/28/2013   Hypothyroidism 09/28/2013   Alteration of body temperature 09/28/2013   Other fatigue 09/28/2013   Plantar fasciitis, bilateral 07/10/2013   Tarsal tunnel syndrome of right side 07/10/2013   Plantar fascial fibromatosis 07/10/2013   Encounter for surveillance of injectable contraceptive 05/13/2013   Diuresis excessive 03/24/2013   Polyuria 03/24/2013   Abnormal uterine and vaginal bleeding, unspecified 03/24/2013   Arthritis, multiple joint involvement 03/17/2013   Fibroblastic disorder 07/28/2012   Borderline personality disorder (HCC) 12/17/2010   Borderline personality disorder in adult [facility]) 12/17/2010   Essential hypertension 02/18/2010   Carpal tunnel syndrome on both sides 12/17/2006   Major depressive disorder, single episode 02/21/1998   Major depressive disorder, single episode 02/21/1998   Disk prolapse 04/17/1995   Depression with anxiety 03/18/1995  OCD (obsessive compulsive disorder) 03/18/1995   Severe anxiety with panic 03/18/1995     1. Well woman exam with routine gynecological exam   2. Screening mammogram for breast cancer   3. Screen for colon cancer   4. Thyroid  disorder screen   5. Other fatigue   6. Other iron  deficiency anemia   7. Symptomatic menopausal or female climacteric states     She is having some signs and symptoms of menopause.  She is amenorrheic from Depo.   Plan:            1.  Basic Screening Recommendations The basic screening recommendations for asymptomatic women were discussed with the patient during her visit.  The age-appropriate recommendations were discussed with her and the rational for the tests reviewed.  When I am informed by the patient that another primary care  physician has previously obtained the age-appropriate tests and they are up-to-date, only outstanding tests are ordered and referrals given as necessary.  Abnormal results of tests will be discussed with her when all of her results are completed.  Routine preventative health maintenance measures emphasized: Exercise/Diet/Weight control, Tobacco Warnings, Alcohol/Substance use risks and Stress Management Mammogram ordered-Cologuard ordered 2.  Patient encouraged to take her medications as directed 3.  Will plan to hold her Depo in September and wait a month before testing for [facility].  Patient advised to avoid intercourse for use another form of birth control during this time. 4.  Lab work at patient request. Orders Orders Placed This Encounter  Procedures   MM DIGITAL SCREENING BILATERAL   Cologuard   TSH   Ferritin   CBC   FSH    No orders of the defined types were placed in this encounter.        F/U  No follow-ups on file.  Alm DOROTHA Sar, M.D. 07/22/2024 9:38 AM

## 2024-07-23 LAB — FERRITIN: Ferritin: 123 ng/mL (ref 15–150)

## 2024-07-23 LAB — CBC
Hematocrit: 46.3 % (ref 34.0–46.6)
Hemoglobin: 15.2 g/dL (ref 11.1–15.9)
MCH: 32.7 pg (ref 26.6–33.0)
MCHC: 32.8 g/dL (ref 31.5–35.7)
MCV: 100 fL — ABNORMAL HIGH (ref 79–97)
Platelets: 261 x10E3/uL (ref 150–450)
RBC: 4.65 x10E6/uL (ref 3.77–5.28)
RDW: 12.1 % (ref 11.7–15.4)
WBC: 8.4 x10E3/uL (ref 3.4–10.8)

## 2024-07-23 LAB — TSH: TSH: 3.34 u[IU]/mL (ref 0.450–4.500)

## 2024-07-24 ENCOUNTER — Inpatient Hospital Stay: Payer: Medicare Other

## 2024-07-24 DIAGNOSIS — R0981 Nasal congestion: Secondary | ICD-10-CM | POA: Diagnosis not present

## 2024-07-24 DIAGNOSIS — R051 Acute cough: Secondary | ICD-10-CM | POA: Diagnosis not present

## 2024-07-24 DIAGNOSIS — Z760 Encounter for issue of repeat prescription: Secondary | ICD-10-CM | POA: Diagnosis not present

## 2024-07-24 DIAGNOSIS — U071 COVID-19: Secondary | ICD-10-CM | POA: Diagnosis not present

## 2024-07-27 ENCOUNTER — Telehealth: Admitting: Physician Assistant

## 2024-07-27 DIAGNOSIS — B999 Unspecified infectious disease: Secondary | ICD-10-CM | POA: Diagnosis not present

## 2024-07-27 DIAGNOSIS — J208 Acute bronchitis due to other specified organisms: Secondary | ICD-10-CM

## 2024-07-27 DIAGNOSIS — B9689 Other specified bacterial agents as the cause of diseases classified elsewhere: Secondary | ICD-10-CM

## 2024-07-27 DIAGNOSIS — U071 COVID-19: Secondary | ICD-10-CM

## 2024-07-27 MED ORDER — NIRMATRELVIR/RITONAVIR (PAXLOVID)TABLET: 3.0000 | ORAL_TABLET | Freq: Two times a day (BID) | ORAL | 0 refills | Status: AC

## 2024-07-27 MED ORDER — AZITHROMYCIN 250 MG PO TABS: ORAL_TABLET | ORAL | 0 refills | Status: AC

## 2024-07-27 NOTE — Patient Instructions (Signed)
 Amanda Webb, thank you for joining Delon CHRISTELLA Dickinson, PA-C for today's virtual visit.  While this provider is not your primary care provider (PCP), if your PCP is located in our provider database this encounter information will be shared with them immediately following your visit.   A [facility] MyChart account gives you access to today's visit and all your visits, tests, and labs performed at [facility]  click here if you don't have a [facility] MyChart account or go to [email or web address]  Consent: (Patient) Amanda Webb provided verbal consent for this virtual visit at the beginning of the encounter.  Current Medications:  Current Outpatient Medications:    azithromycin  (ZITHROMAX ) 250 MG tablet, Take 2 tablets on day 1, then 1 tablet daily on days 2 through 5, Disp: 6 tablet, Rfl: 0   nirmatrelvir /ritonavir  (PAXLOVID ) 20 x 150 MG & 10 x 100MG  TABS, Take 3 tablets by mouth 2 (two) times daily for 5 days. (Take nirmatrelvir  150 mg two tablets twice daily for 5 days and ritonavir  100 mg one tablet twice daily for 5 days) Patient GFR is greater than 60, Disp: 30 tablet, Rfl: 0   Accu-Chek FastClix Lancets MISC, USE TO CHECK BLOOD SUGAR UP TO TWICE DAILY AS DIRECTED, Disp: 102 each, Rfl: 5   acetaminophen  (TYLENOL ) 500 MG tablet, Take 2 tablets (1,000 mg total) by mouth every 8 (eight) hours., Disp: 90 tablet, Rfl: 2   albuterol  (PROVENTIL ) (2.5 MG/3ML) 0.083% nebulizer solution, Inhale 3ml (one vial) via nebulized route every four hours around the clock for 24 hours, followed by one vial every 6 hours for the next 24 hours. After 48 hours, take 3mL via neb every 6-8 hours PRN cough, wheeze (Patient taking differently: Take 2.5 mg by nebulization every 6 (six) hours as needed for wheezing or shortness of breath.), Disp: 150 mL, Rfl: 1   albuterol  (VENTOLIN  HFA) 108 (90 Base) MCG/ACT inhaler, INHALE 1 TO 2 PUFFS BY MOUTH EVERY 4 TO 6 HOURS AS NEEDED FOR SHORTNESS OF  BREATH, Disp: 18 g, Rfl: 2   albuterol  (VENTOLIN  HFA) 108 (90 Base) MCG/ACT inhaler, Inhale 2 puffs into the lungs every 6 (six) hours as needed for wheezing or shortness of breath., Disp: 8 g, Rfl: 0   ARIPiprazole (ABILIFY) 2 MG tablet, Take 2 mg by mouth every morning. , Disp: , Rfl:    atenolol  (TENORMIN ) 50 MG tablet, TAKE 1 TABLET(50 MG) BY MOUTH DAILY, Disp: 90 tablet, Rfl: 0   azelastine  (ASTELIN ) 0.1 % nasal spray, Place 1 spray into both nostrils 2 (two) times daily. Use in each nostril as directed, Disp: 30 mL, Rfl: 5   cetirizine (ZYRTEC) 10 MG tablet, Take 10 mg by mouth daily with lunch., Disp: , Rfl:    Cyanocobalamin  (B-12 COMPLIANCE INJECTION IJ), Inject 1 Dose as directed every 30 (thirty) days., Disp: , Rfl:    fluconazole  (DIFLUCAN ) 150 MG tablet, Take 1 tablet (150 mg total) by mouth daily., Disp: 1 tablet, Rfl: 0   fluticasone  (FLONASE ) 50 MCG/ACT nasal spray, Place 2 sprays into both nostrils daily. SHAKE LIQUID AND USE 2 SPRAYS IN EACH NOSTRIL EVERY DAY, Disp: 9.9 mL, Rfl: 6   fluvoxaMINE (LUVOX) 100 MG tablet, Take 150 mg by mouth every morning. , Disp: , Rfl:    gabapentin  (NEURONTIN ) 100 MG capsule, Take 200 mg by mouth daily., Disp: , Rfl:    glucose blood (ACCU-CHEK GUIDE) test strip, CHECK BLOOD SUGAR UP TO EVERY DAY AS DIRECTED, Disp: 200  strip, Rfl: 5   HYDROcodone -acetaminophen  (NORCO/VICODIN) 5-325 MG tablet, Take 1-2 tablets by mouth every 4 (four) hours as needed for moderate pain (pain score 4-6)., Disp: 10 tablet, Rfl: 0   hydrOXYzine  (VISTARIL ) 25 MG capsule, TAKE 1 CAPSULE(25 MG) BY MOUTH EVERY 8 HOURS AS NEEDED (Patient not taking: Reported on 07/22/2024), Disp: 30 capsule, Rfl: 0   iron  sucrose 100 mg in sodium chloride  0.9 % 100 mL, Inject 100 mg into the vein., Disp: , Rfl:    ketoconazole  (NIZORAL ) 2 % shampoo, APPLY EXTERNALLY 2 TIMES A WEEK (Patient not taking: Reported on 06/24/2024), Disp: 120 mL, Rfl: 2   lamoTRIgine (LAMICTAL) 200 MG tablet, Take 200 mg  by mouth daily., Disp: , Rfl:    levothyroxine  (SYNTHROID ) 50 MCG tablet, Take 1 tablet (50 mcg total) by mouth daily before breakfast., Disp: 90 tablet, Rfl: 1   montelukast  (SINGULAIR ) 10 MG tablet, Take 1 tablet (10 mg total) by mouth at bedtime., Disp: 90 tablet, Rfl: 1   MOUNJARO  5 MG/0.5ML Pen, Inject 5 mg into the skin once a week., Disp: 2 mL, Rfl: 0   Multiple Vitamin (MULTIVITAMIN WITH MINERALS) TABS tablet, Take 1 tablet by mouth daily., Disp: , Rfl:    nystatin  cream (MYCOSTATIN ), Apply 1 application. topically 2 (two) times daily as needed (rash). (Patient not taking: Reported on 06/24/2024), Disp: 30 g, Rfl: 5   ondansetron  (ZOFRAN -ODT) 4 MG disintegrating tablet, Take 1 tablet (4 mg total) by mouth every 8 (eight) hours as needed for nausea or vomiting., Disp: 20 tablet, Rfl: 0   pantoprazole  (PROTONIX ) 20 MG tablet, TAKE 1 TABLET(20 MG) BY MOUTH DAILY, Disp: 90 tablet, Rfl: 1   TRELEGY ELLIPTA  100-62.5-25 MCG/ACT AEPB, INHALE 1 PUFF INTO THE LUNGS DAILY, Disp: 180 each, Rfl: 1   tretinoin  (RETIN-A ) 0.025 % cream, APPLY EXTERNALLY TO THE AFFECTED AREA AT BEDTIME AS NEEDED, Disp: 45 g, Rfl: 2 No current facility-administered medications for this visit.  Facility-Administered Medications Ordered in Other Visits:    cyanocobalamin  (VITAMIN B12) injection 1,000 mcg, 1,000 mcg, Intramuscular, Once, Melanee Annah BROCKS, MD   cyanocobalamin  (VITAMIN B12) injection 1,000 mcg, 1,000 mcg, Intramuscular, Q30 days, Melanee Annah BROCKS, MD, 1,000 mcg at 02/05/24 0909   Medications ordered in this encounter:  Meds ordered this encounter  Medications   nirmatrelvir /ritonavir  (PAXLOVID ) 20 x 150 MG & 10 x 100MG  TABS    Sig: Take 3 tablets by mouth 2 (two) times daily for 5 days. (Take nirmatrelvir  150 mg two tablets twice daily for 5 days and ritonavir  100 mg one tablet twice daily for 5 days) Patient GFR is greater than 60    Dispense:  30 tablet    Refill:  0    Supervising Provider:   BLAISE ALEENE KIDD  [[id number]]   azithromycin  (ZITHROMAX ) 250 MG tablet    Sig: Take 2 tablets on day 1, then 1 tablet daily on days 2 through 5    Dispense:  6 tablet    Refill:  0    Supervising Provider:   LAMPTEY, PHILIP O [[id number]]     *If you need refills on other medications prior to your next appointment, please contact your pharmacy*  Follow-Up: Call back or seek an in-person evaluation if the symptoms worsen or if the condition fails to improve as anticipated.  [facility] [phone]  Care Instructions: Can take to lessen severity: Vit C 500mg  twice daily Quercertin 250-500mg  twice daily Zinc 75-100mg  daily Melatonin 3-6 mg at  bedtime Vit D3 1000-2000 IU daily Aspirin  81 mg daily with food Optional: Famotidine 20mg  daily Also can add tylenol /ibuprofen  as needed for fevers and body aches May add Mucinex  or Mucinex  DM as needed for cough/congestion    Isolation Instructions: You are to isolate at home until you have been fever free for at least 24 hours without a fever-reducing medication, and symptoms have been steadily improving for 24 hours. At that time,  you can end isolation but need to mask for an additional 5 days.   If you must be around other household members who do not have symptoms, you need to make sure that both you and the family members are masking consistently with a high-quality mask.  If you note any worsening of symptoms despite treatment, please seek an in-person evaluation ASAP. If you note any significant shortness of breath or any chest pain, please seek ER evaluation. Please do not delay care!   COVID-19: What to Do if You Are Sick If you test positive and are an older adult or someone who is at high risk of getting very sick from COVID-19, treatment may be available. Contact a healthcare provider right away after a positive test to determine if you are eligible, even if your symptoms are mild right now. You can also visit a Test to Treat location  and, if eligible, receive a prescription from a provider. Don't delay: Treatment must be started within the first few days to be effective. If you have a fever, cough, or other symptoms, you might have COVID-19. Most people have mild illness and are able to recover at home. If you are sick: Keep track of your symptoms. If you have an emergency warning sign (including trouble breathing), call 911. Steps to help prevent the spread of COVID-19 if you are sick If you are sick with COVID-19 or think you might have COVID-19, follow the steps below to care for yourself and to help protect other people in your home and community. Stay home except to get medical care Stay home. Most people with COVID-19 have mild illness and can recover at home without medical care. Do not leave your home, except to get medical care. Do not visit public areas and do not go to places where you are unable to wear a mask. Take care of yourself. Get rest and stay hydrated. Take over-the-counter medicines, such as acetaminophen , to help you feel better. Stay in touch with your doctor. Call before you get medical care. Be sure to get care if you have trouble breathing, or have any other emergency warning signs, or if you think it is an emergency. Avoid public transportation, ride-sharing, or taxis if possible. Get tested If you have symptoms of COVID-19, get tested. While waiting for test results, stay away from others, including staying apart from those living in your household. Get tested as soon as possible after your symptoms start. Treatments may be available for people with COVID-19 who are at risk for becoming very sick. Don't delay: Treatment must be started early to be effective--some treatments must begin within 5 days of your first symptoms. Contact your healthcare provider right away if your test result is positive to determine if you are eligible. Self-tests are one of several options for testing for the virus that  causes COVID-19 and may be more convenient than laboratory-based tests and point-of-care tests. Ask your healthcare provider or your local health department if you need help interpreting your test results. You can visit your state,  tribal, local, and territorial health department's website to look for the latest local information on testing sites. Separate yourself from other people As much as possible, stay in a specific room and away from other people and pets in your home. If possible, you should use a separate bathroom. If you need to be around other people or animals in or outside of the home, wear a well-fitting mask. Tell your close contacts that they may have been exposed to COVID-19. An infected person can spread COVID-19 starting 48 hours (or 2 days) before the person has any symptoms or tests positive. By letting your close contacts know they may have been exposed to COVID-19, you are helping to protect everyone. See COVID-19 and Animals if you have questions about pets. If you are diagnosed with COVID-19, someone from the health department may call you. Answer the call to slow the spread. Monitor your symptoms Symptoms of COVID-19 include fever, cough, or other symptoms. Follow care instructions from your healthcare provider and local health department. Your local health authorities may give instructions on checking your symptoms and reporting information. When to seek emergency medical attention Look for emergency warning signs* for COVID-19. If someone is showing any of these signs, seek emergency medical care immediately: Trouble breathing Persistent pain or pressure in the chest New confusion Inability to wake or stay awake Pale, gray, or blue-colored skin, lips, or nail beds, depending on skin tone *This list is not all possible symptoms. Please call your medical provider for any other symptoms that are severe or concerning to you. Call 911 or call ahead to your local emergency  facility: Notify the operator that you are seeking care for someone who has or may have COVID-19. Call ahead before visiting your doctor Call ahead. Many medical visits for routine care are being postponed or done by phone or telemedicine. If you have a medical appointment that cannot be postponed, call your doctor's office, and tell them you have or may have COVID-19. This will help the office protect themselves and other patients. If you are sick, wear a well-fitting mask You should wear a mask if you must be around other people or animals, including pets (even at home). Wear a mask with the best fit, protection, and comfort for you. You don't need to wear the mask if you are alone. If you can't put on a mask (because of trouble breathing, for example), cover your coughs and sneezes in some other way. Try to stay at least 6 feet away from other people. This will help protect the people around you. Masks should not be placed on young children under age 14 years, anyone who has trouble breathing, or anyone who is not able to remove the mask without help. Cover your coughs and sneezes Cover your mouth and nose with a tissue when you cough or sneeze. Throw away used tissues in a lined trash can. Immediately wash your hands with soap and water for at least 20 seconds. If soap and water are not available, clean your hands with an alcohol-based hand sanitizer that contains at least 60% alcohol. Clean your hands often Wash your hands often with soap and water for at least 20 seconds. This is especially important after blowing your nose, coughing, or sneezing; going to the bathroom; and before eating or preparing food. Use hand sanitizer if soap and water are not available. Use an alcohol-based hand sanitizer with at least 60% alcohol, covering all surfaces of your hands and rubbing them together  until they feel dry. Soap and water are the best option, especially if hands are visibly dirty. Avoid touching  your eyes, nose, and mouth with unwashed hands. Handwashing Tips Avoid sharing personal household items Do not share dishes, drinking glasses, cups, eating utensils, towels, or bedding with other people in your home. Wash these items thoroughly after using them with soap and water or put in the dishwasher. Clean surfaces in your home regularly Clean and disinfect high-touch surfaces (for example, doorknobs, tables, handles, light switches, and countertops) in your sick room and bathroom. In shared spaces, you should clean and disinfect surfaces and items after each use by the person who is ill. If you are sick and cannot clean, a caregiver or other person should only clean and disinfect the area around you (such as your bedroom and bathroom) on an as needed basis. Your caregiver/other person should wait as long as possible (at least several hours) and wear a mask before entering, cleaning, and disinfecting shared spaces that you use. Clean and disinfect areas that may have blood, stool, or body fluids on them. Use household cleaners and disinfectants. Clean visible dirty surfaces with household cleaners containing soap or detergent. Then, use a household disinfectant. Use a product from [employer] List N: Disinfectants for Coronavirus (COVID-19). Be sure to follow the instructions on the label to ensure safe and effective use of the product. Many products recommend keeping the surface wet with a disinfectant for a certain period of time (look at contact time on the product label). You may also need to wear personal protective equipment, such as gloves, depending on the directions on the product label. Immediately after disinfecting, wash your hands with soap and water for 20 seconds. For completed guidance on cleaning and disinfecting your home, visit Complete Disinfection Guidance. Take steps to improve ventilation at home Improve ventilation (air flow) at home to help prevent from spreading COVID-19  to other people in your household. Clear out COVID-19 virus particles in the air by opening windows, using air filters, and turning on fans in your home. Use this interactive tool to learn how to improve air flow in your home. When you can be around others after being sick with COVID-19 Deciding when you can be around others is different for different situations. Find out when you can safely end home isolation. For any additional questions about your care, contact your healthcare provider or state or local health department. 03/07/2021 Content source: [facility] ([facility]), Division of Viral Diseases This information is not intended to replace advice given to you by your health care provider. Make sure you discuss any questions you have with your health care provider. Document Revised: 04/20/2021 Document Reviewed: 04/20/2021 Elsevier Patient Education  2022 [employer].     If you have been instructed to have an in-person evaluation today at a local Urgent Care facility, please use the link below. It will take you to a list of all of our available [facility], including address, phone number and hours of operation. Please do not delay care.  [facility]  If you or a family member do not have a primary care provider, use the link below to schedule a visit and establish care. When you choose a [facility] primary care physician or advanced practice provider, you gain a long-term partner in health. Find a Primary Care Provider  Learn more about [facility]'s in-office and virtual care options:  - Get  Care Now

## 2024-07-27 NOTE — Progress Notes (Signed)
 Virtual Visit Consent   Amanda Webb, you are scheduled for a virtual visit with a [facility] provider today. Just as with appointments in the office, your consent must be obtained to participate. Your consent will be active for this visit and any virtual visit you may have with one of our providers in the next 365 days. If you have a MyChart account, a copy of this consent can be sent to you electronically.  As this is a virtual visit, video technology does not allow for your provider to perform a traditional examination. This may limit your provider's ability to fully assess your condition. If your provider identifies any concerns that need to be evaluated in person or the need to arrange testing (such as labs, EKG, etc.), we will make arrangements to do so. Although advances in technology are sophisticated, we cannot ensure that it will always work on either your end or our end. If the connection with a video visit is poor, the visit may have to be switched to a telephone visit. With either a video or telephone visit, we are not always able to ensure that we have a secure connection.  By engaging in this virtual visit, you consent to the provision of healthcare and authorize for your insurance to be billed (if applicable) for the services provided during this visit. Depending on your insurance coverage, you may receive a charge related to this service.  I need to obtain your verbal consent now. Are you willing to proceed with your visit today? Amanda Webb has provided verbal consent on 07/27/2024 for a virtual visit (video or telephone). Delon CHRISTELLA Dickinson, PA-C  Date: 07/27/2024 8:45 AM   Virtual Visit via Video Note   I, Delon CHRISTELLA Dickinson, connected with  Amanda Webb  ([id number], Dec 27, 1970) on 07/27/24 at  8:00 AM EDT by a video-enabled telemedicine application and verified that I am speaking with the correct person using two identifiers.  Location: Patient: Virtual Visit Location  Patient: Mobile Provider: Virtual Visit Location Provider: Home Office   I discussed the limitations of evaluation and management by telemedicine and the availability of in person appointments. The patient expressed understanding and agreed to proceed.    History of Present Illness: Amanda Webb is a 52 y.o. who identifies as a female who was assigned female at birth, and is being seen today for Covid 19 and possible bronchitis.  HPI: URI  This is a new problem. The current episode started in the past 7 days (Went to [place] on Friday, 07/24/24, and tested positive for Covid; Symptoms started too worsen Saturday). The problem has been gradually worsening. Maximum temperature: 99.4 highest. Associated symptoms include chest pain (tightness), congestion, coughing, diarrhea (loose stools), headaches, a plugged ear sensation (bilaterally), rhinorrhea, sinus pain, a sore throat (scratchy) and wheezing (with lying). Pertinent negatives include no ear pain, nausea or vomiting. She has tried inhaler use and NSAIDs (ibuprofen ) for the symptoms. The treatment provided no relief.     Problems:  Patient Active Problem List   Diagnosis Date Noted   Major depressive disorder, recurrent, in partial remission (HCC) 11/08/2020   Cough 10/21/2020   Status post laparoscopic cholecystectomy 07/28/2020   Morbid obesity (HCC) 07/05/2020   Hepatic steatosis 06/23/2020   Lymphedema 03/09/2020   Chronic venous insufficiency 03/09/2020   Bilateral lower extremity edema 02/16/2020   Female cystocele 10/21/2018   Urinary incontinence, mixed 10/21/2018   B12 deficiency 04/08/2018   Iron  deficiency anemia 03/14/2018  GAD (generalized anxiety disorder) 03/10/2018   Acute right-sided low back pain 10/28/2017   Tobacco abuse 05/09/2017   Multiple thyroid  nodules 12/28/2016   Simple chronic bronchitis (HCC) 12/13/2016   BMI 40.0-44.9, adult (HCC) 09/26/2016   HLD (hyperlipidemia) 09/26/2016   Vitamin D deficiency  09/26/2016   Type 2 diabetes mellitus with other specified complication (HCC) 06/17/2015   Candidiasis of skin and nail 02/21/2015   Candidal intertrigo 02/21/2015   Cellulitis of right lower extremity 02/18/2015   Benign cyst of right kidney 01/17/2015   Chronic sinusitis 11/15/2014   Whiplash injury 09/03/2014   Heart palpitations 04/02/2014   Palpitations 04/02/2014   Allergic rhinitis 12/31/2013   Spondylosis of lumbar region without myelopathy or radiculopathy 12/28/2013   Chronic low back pain 09/28/2013   Other fatigue 09/28/2013   Hypothyroidism 09/28/2013   Alteration of body temperature 09/28/2013   Other fatigue 09/28/2013   Plantar fasciitis, bilateral 07/10/2013   Tarsal tunnel syndrome of right side 07/10/2013   Plantar fascial fibromatosis 07/10/2013   Encounter for surveillance of injectable contraceptive 05/13/2013   Diuresis excessive 03/24/2013   Polyuria 03/24/2013   Abnormal uterine and vaginal bleeding, unspecified 03/24/2013   Arthritis, multiple joint involvement 03/17/2013   Fibroblastic disorder 07/28/2012   Borderline personality disorder (HCC) 12/17/2010   Borderline personality disorder in adult [facility]) 12/17/2010   Essential hypertension 02/18/2010   Carpal tunnel syndrome on both sides 12/17/2006   Major depressive disorder, single episode 02/21/1998   Major depressive disorder, single episode 02/21/1998   Disk prolapse 04/17/1995   Depression with anxiety 03/18/1995   OCD (obsessive compulsive disorder) 03/18/1995   Severe anxiety with panic 03/18/1995    Allergies:  Allergies  Allergen Reactions   Bee Venom Anaphylaxis, Hives and Swelling    Carries Epi pen.    Cat Dander Itching, Other (See Comments) and Swelling    Allergic to trees, nuts, wheat, grass, cats & dogs - itchy watery eyes, swelling. Uses Zyrtec & Flonase  & Benadryl if really bad. Used to get allergy shots. Allergic to trees, nuts, wheat, grass, cats & dogs - itchy watery eyes,  swelling. Uses Zyrtec & Flonase  & Benadryl if really bad. Used to get allergy shots.   Dog Epithelium Cough and Shortness Of Breath   Dog Epithelium (Canis Lupus Familiaris) Shortness Of Breath   Dog Fennel Cough and Shortness Of Breath   Dog Fennel Allergy Skin Test Shortness Of Breath   Dust Mite Extract Cough and Shortness Of Breath   Tetracyclines & Related Nausea And Vomiting   Lactose Diarrhea and Nausea And Vomiting   Milk (Cow) Diarrhea and Nausea And Vomiting   Milk Protein Diarrhea and Nausea And Vomiting   Milk-Related Compounds Diarrhea and Nausea And Vomiting   Tape Other (See Comments) and Rash    Needs to use paper tape. Breaks out with severe rash, pulls skin off when using adhesive.   Wound Dressing Adhesive Rash    Other reaction(s): Other Needs to use paper tape. Breaks out with severe rash, pulls skin off when using adhesive.  Needs to use paper tape. Breaks out with severe rash, pulls skin off when using adhesive. Needs to use paper tape. Breaks out with severe rash, pulls skin off when using adhesive.   Medications:  Current Outpatient Medications:    azithromycin  (ZITHROMAX ) 250 MG tablet, Take 2 tablets on day 1, then 1 tablet daily on days 2 through 5, Disp: 6 tablet, Rfl: 0   nirmatrelvir /ritonavir  (PAXLOVID ) 20 x 150  MG & 10 x 100MG  TABS, Take 3 tablets by mouth 2 (two) times daily for 5 days. (Take nirmatrelvir  150 mg two tablets twice daily for 5 days and ritonavir  100 mg one tablet twice daily for 5 days) Patient GFR is greater than 60, Disp: 30 tablet, Rfl: 0   Accu-Chek FastClix Lancets MISC, USE TO CHECK BLOOD SUGAR UP TO TWICE DAILY AS DIRECTED, Disp: 102 each, Rfl: 5   acetaminophen  (TYLENOL ) 500 MG tablet, Take 2 tablets (1,000 mg total) by mouth every 8 (eight) hours., Disp: 90 tablet, Rfl: 2   albuterol  (PROVENTIL ) (2.5 MG/3ML) 0.083% nebulizer solution, Inhale 3ml (one vial) via nebulized route every four hours around the clock for 24 hours, followed by  one vial every 6 hours for the next 24 hours. After 48 hours, take 3mL via neb every 6-8 hours PRN cough, wheeze (Patient taking differently: Take 2.5 mg by nebulization every 6 (six) hours as needed for wheezing or shortness of breath.), Disp: 150 mL, Rfl: 1   albuterol  (VENTOLIN  HFA) 108 (90 Base) MCG/ACT inhaler, INHALE 1 TO 2 PUFFS BY MOUTH EVERY 4 TO 6 HOURS AS NEEDED FOR SHORTNESS OF BREATH, Disp: 18 g, Rfl: 2   albuterol  (VENTOLIN  HFA) 108 (90 Base) MCG/ACT inhaler, Inhale 2 puffs into the lungs every 6 (six) hours as needed for wheezing or shortness of breath., Disp: 8 g, Rfl: 0   ARIPiprazole (ABILIFY) 2 MG tablet, Take 2 mg by mouth every morning. , Disp: , Rfl:    atenolol  (TENORMIN ) 50 MG tablet, TAKE 1 TABLET(50 MG) BY MOUTH DAILY, Disp: 90 tablet, Rfl: 0   azelastine  (ASTELIN ) 0.1 % nasal spray, Place 1 spray into both nostrils 2 (two) times daily. Use in each nostril as directed, Disp: 30 mL, Rfl: 5   cetirizine (ZYRTEC) 10 MG tablet, Take 10 mg by mouth daily with lunch., Disp: , Rfl:    Cyanocobalamin  (B-12 COMPLIANCE INJECTION IJ), Inject 1 Dose as directed every 30 (thirty) days., Disp: , Rfl:    fluconazole  (DIFLUCAN ) 150 MG tablet, Take 1 tablet (150 mg total) by mouth daily., Disp: 1 tablet, Rfl: 0   fluticasone  (FLONASE ) 50 MCG/ACT nasal spray, Place 2 sprays into both nostrils daily. SHAKE LIQUID AND USE 2 SPRAYS IN EACH NOSTRIL EVERY DAY, Disp: 9.9 mL, Rfl: 6   fluvoxaMINE (LUVOX) 100 MG tablet, Take 150 mg by mouth every morning. , Disp: , Rfl:    gabapentin  (NEURONTIN ) 100 MG capsule, Take 200 mg by mouth daily., Disp: , Rfl:    glucose blood (ACCU-CHEK GUIDE) test strip, CHECK BLOOD SUGAR UP TO EVERY DAY AS DIRECTED, Disp: 200 strip, Rfl: 5   HYDROcodone -acetaminophen  (NORCO/VICODIN) 5-325 MG tablet, Take 1-2 tablets by mouth every 4 (four) hours as needed for moderate pain (pain score 4-6)., Disp: 10 tablet, Rfl: 0   hydrOXYzine  (VISTARIL ) 25 MG capsule, TAKE 1 CAPSULE(25  MG) BY MOUTH EVERY 8 HOURS AS NEEDED (Patient not taking: Reported on 07/22/2024), Disp: 30 capsule, Rfl: 0   iron  sucrose 100 mg in sodium chloride  0.9 % 100 mL, Inject 100 mg into the vein., Disp: , Rfl:    ketoconazole  (NIZORAL ) 2 % shampoo, APPLY EXTERNALLY 2 TIMES A WEEK (Patient not taking: Reported on 06/24/2024), Disp: 120 mL, Rfl: 2   lamoTRIgine (LAMICTAL) 200 MG tablet, Take 200 mg by mouth daily., Disp: , Rfl:    levothyroxine  (SYNTHROID ) 50 MCG tablet, Take 1 tablet (50 mcg total) by mouth daily before breakfast., Disp: 90 tablet, Rfl:  1   montelukast  (SINGULAIR ) 10 MG tablet, Take 1 tablet (10 mg total) by mouth at bedtime., Disp: 90 tablet, Rfl: 1   MOUNJARO  5 MG/0.5ML Pen, Inject 5 mg into the skin once a week., Disp: 2 mL, Rfl: 0   Multiple Vitamin (MULTIVITAMIN WITH MINERALS) TABS tablet, Take 1 tablet by mouth daily., Disp: , Rfl:    nystatin  cream (MYCOSTATIN ), Apply 1 application. topically 2 (two) times daily as needed (rash). (Patient not taking: Reported on 06/24/2024), Disp: 30 g, Rfl: 5   ondansetron  (ZOFRAN -ODT) 4 MG disintegrating tablet, Take 1 tablet (4 mg total) by mouth every 8 (eight) hours as needed for nausea or vomiting., Disp: 20 tablet, Rfl: 0   pantoprazole  (PROTONIX ) 20 MG tablet, TAKE 1 TABLET(20 MG) BY MOUTH DAILY, Disp: 90 tablet, Rfl: 1   TRELEGY ELLIPTA  100-62.5-25 MCG/ACT AEPB, INHALE 1 PUFF INTO THE LUNGS DAILY, Disp: 180 each, Rfl: 1   tretinoin  (RETIN-A ) 0.025 % cream, APPLY EXTERNALLY TO THE AFFECTED AREA AT BEDTIME AS NEEDED, Disp: 45 g, Rfl: 2 No current facility-administered medications for this visit.  Facility-Administered Medications Ordered in Other Visits:    cyanocobalamin  (VITAMIN B12) injection 1,000 mcg, 1,000 mcg, Intramuscular, Once, Melanee Annah BROCKS, MD   cyanocobalamin  (VITAMIN B12) injection 1,000 mcg, 1,000 mcg, Intramuscular, Q30 days, Melanee Annah BROCKS, MD, 1,000 mcg at 02/05/24 9090  Observations/Objective: Patient is well-developed,  well-nourished in no acute distress.  Resting comfortably at home.  Head is normocephalic, atraumatic.  No labored breathing.  Speech is clear and coherent with logical content.  Patient is alert and oriented at baseline.    Assessment and Plan: 1. COVID-19 (Primary) - nirmatrelvir /ritonavir  (PAXLOVID ) 20 x 150 MG & 10 x 100MG  TABS; Take 3 tablets by mouth 2 (two) times daily for 5 days. (Take nirmatrelvir  150 mg two tablets twice daily for 5 days and ritonavir  100 mg one tablet twice daily for 5 days) Patient GFR is greater than 60  Dispense: 30 tablet; Refill: 0  2. Acute bacterial bronchitis - azithromycin  (ZITHROMAX ) 250 MG tablet; Take 2 tablets on day 1, then 1 tablet daily on days 2 through 5  Dispense: 6 tablet; Refill: 0  3. Superimposed infection  - Continue OTC symptomatic management of choice - Will send OTC vitamins and supplement information through AVS - Paxlovid  prescribed - Zpack added for possible bronchitis as cough is worsening and changing and patient is a current smoker. - Patient enrolled in MyChart symptom monitoring - Push fluids - Rest as needed - Discussed return precautions and when to seek in-person evaluation, sent via AVS as well   Follow Up Instructions: I discussed the assessment and treatment plan with the patient. The patient was provided an opportunity to ask questions and all were answered. The patient agreed with the plan and demonstrated an understanding of the instructions.  A copy of instructions were sent to the patient via MyChart unless otherwise noted below.    The patient was advised to call back or seek an in-person evaluation if the symptoms worsen or if the condition fails to improve as anticipated.    Delon CHRISTELLA Dickinson, PA-C

## 2024-07-29 ENCOUNTER — Other Ambulatory Visit: Payer: Self-pay | Admitting: Family Medicine

## 2024-07-29 ENCOUNTER — Ambulatory Visit (INDEPENDENT_AMBULATORY_CARE_PROVIDER_SITE_OTHER): Admitting: Family Medicine

## 2024-07-29 ENCOUNTER — Encounter: Payer: Self-pay | Admitting: Family Medicine

## 2024-07-29 VITALS — BP 138/84 | HR 100 | Temp 98.8°F | Ht 67.0 in | Wt 244.4 lb

## 2024-07-29 DIAGNOSIS — E782 Mixed hyperlipidemia: Secondary | ICD-10-CM

## 2024-07-29 DIAGNOSIS — I10 Essential (primary) hypertension: Secondary | ICD-10-CM

## 2024-07-29 DIAGNOSIS — Z7985 Long-term (current) use of injectable non-insulin antidiabetic drugs: Secondary | ICD-10-CM | POA: Diagnosis not present

## 2024-07-29 DIAGNOSIS — Z0000 Encounter for general adult medical examination without abnormal findings: Secondary | ICD-10-CM

## 2024-07-29 DIAGNOSIS — E1169 Type 2 diabetes mellitus with other specified complication: Secondary | ICD-10-CM | POA: Diagnosis not present

## 2024-07-29 DIAGNOSIS — E039 Hypothyroidism, unspecified: Secondary | ICD-10-CM

## 2024-07-29 LAB — COMPREHENSIVE METABOLIC PANEL WITH GFR
AG Ratio: 1.5 (calc) (ref 1.0–2.5)
ALT: 26 U/L (ref 6–29)
AST: 18 U/L (ref 10–35)
Albumin: 4.4 g/dL (ref 3.6–5.1)
Alkaline phosphatase (APISO): 85 U/L (ref 37–153)
BUN: 7 mg/dL (ref 7–25)
CO2: 24 mmol/L (ref 20–32)
Calcium: 9.5 mg/dL (ref 8.6–10.4)
Chloride: 103 mmol/L (ref 98–110)
Creat: 0.6 mg/dL (ref 0.50–1.03)
Globulin: 2.9 g/dL (ref 1.9–3.7)
Glucose, Bld: 153 mg/dL — ABNORMAL HIGH (ref 65–99)
Potassium: 4.1 mmol/L (ref 3.5–5.3)
Sodium: 139 mmol/L (ref 135–146)
Total Bilirubin: 0.4 mg/dL (ref 0.2–1.2)
Total Protein: 7.3 g/dL (ref 6.1–8.1)
eGFR: 108 mL/min/1.73m2 (ref >=60)

## 2024-07-29 LAB — LIPID PANEL
Cholesterol: 205 mg/dL — ABNORMAL HIGH (ref <200)
HDL: 52 mg/dL (ref >=50)
LDL Cholesterol (Calc): 131 mg/dL — ABNORMAL HIGH
Non-HDL Cholesterol (Calc): 153 mg/dL — ABNORMAL HIGH (ref <130)
Total CHOL/HDL Ratio: 3.9 (calc) (ref <5.0)
Triglycerides: 111 mg/dL (ref <150)

## 2024-07-29 MED ORDER — ATENOLOL 50 MG PO TABS: ORAL_TABLET | ORAL | 3 refills | Status: DC

## 2024-07-29 NOTE — Patient Instructions (Signed)
 Thank you for coming to the office today.    Please schedule a Follow-up Appointment to: No follow-ups on file.  If you have any other questions or concerns, please feel free to call the office or send a message through MyChart. You may also schedule an earlier appointment if necessary.  Additionally, you may be receiving a survey about your experience at our office within a few days to 1 week by e-mail or mail. We value your feedback.  Marsa Officer, DO [facility], [place]

## 2024-07-29 NOTE — Progress Notes (Signed)
 Subjective:    Patient ID: Amanda Webb, female    DOB: [birth year], 53 y.o.   MRN: [id number]  Amanda Webb is a 53 y.o. female presenting on 07/29/2024 for Medical Management of Chronic Issues, Covid Positive (Recent COVID, already treated.), and Hyperlipidemia   HPI  Discussed the use of AI scribe software for clinical note transcription with the patient, who gave verbal consent to proceed.  History of Present Illness   Amanda Webb is a 53 year old female who presents for evaluation of her cholesterol levels.  Paresthesia of toes vs Peripheral Neuropathy - Tingling in the tips of the toes since the week of July 12th while in [place]  - Associates the sensation with history of back problems, including previous injections and MRIs for pinched nerves - Questions about neuropathy source if it is related to back or diabetes, now well controlled  Hyperlipidemia evaluation - Uncertain of most recent cholesterol levels; last lipid panel was over a year ago, mild elevated - Insurance company recommended statin therapy due to diabetes - Not currently taking aspirin  - Not on Statin - Interested in repeat lipid panel  Covid-19 infection - resolving - Symptom onset on Sunday, August 10th, with worsening on Monday, August 11th - Tested positive on Friday, August 8th, after grandson tested positive on Thursday, August 7th - Persistent cough that is intermittent and sometimes uncontrollable - Currently completing a course of Paxlovid  - Improving overall - Z-Pak available if symptoms do not improve  Hypertension management - Requested refill of atenolol  to be [facility] at [facility]  - Regular eye exams every six months due to increased pressure in right eye - Due for a diabetes-specific eye exam         03/27/2024   10:21 AM 10/22/2023    8:22 AM 09/03/2023   10:54 AM  Depression screen PHQ 2/9  Decreased Interest 1 2 2   Down, Depressed, Hopeless 1 1 1   PHQ - 2 Score 2 3 3    Altered sleeping 2 0 0  Tired, decreased energy 2 1 2   Change in appetite 0 0 0  Feeling bad or failure about yourself  1 2 1   Trouble concentrating 1 0 0  Moving slowly or fidgety/restless 0 0 0  Suicidal thoughts 0 0 0  PHQ-9 Score 8 6 6   Difficult doing work/chores Not difficult at all Somewhat difficult Very difficult       03/27/2024   10:21 AM 10/22/2023    8:22 AM 09/03/2023   10:54 AM 11/19/2022   10:50 AM  GAD 7 : Generalized Anxiety Score  Nervous, Anxious, on Edge 1 2 2 3   Control/stop worrying 1 2 2 3   Worry too much - different things 1 2 2 3   Trouble relaxing 1 2 2 3   Restless 1 0 1 3  Easily annoyed or irritable 1 3 3 3   Afraid - awful might happen 1 3 2 3   Total GAD 7 Score 7 14 14 21   Anxiety Difficulty Not difficult at all   Somewhat difficult    Social History   Tobacco Use   Smoking status: Every Day    Current packs/day: 1.00    Average packs/day: 1 pack/day for 32.0 years (32.0 ttl pk-yrs)    Types: Cigarettes    Passive exposure: Current   Smokeless tobacco: Never   Tobacco comments:    1 ppd/ 11/22/2020  Vaping Use   Vaping status: Never Used  Substance Use Topics  Alcohol use: Yes    Alcohol/week: 3.0 standard drinks of alcohol    Types: 3 Glasses of wine per week    Comment: per week   Drug use: No    Review of Systems Per HPI unless specifically indicated above     Objective:    BP 138/84 (BP Location: Left Arm, Cuff Size: Normal)   Pulse 100   Temp 98.8 F (37.1 C) (Oral)   Ht 5' 7 (1.702 m)   Wt 244 lb 6 oz (110.8 kg)   SpO2 97%   BMI 38.27 kg/m   Wt Readings from Last 3 Encounters:  07/29/24 244 lb 6 oz (110.8 kg)  07/22/24 246 lb 1.6 oz (111.6 kg)  07/03/24 250 lb (113.4 kg)    Physical Exam Vitals and nursing note reviewed.  Constitutional:      General: She is not in acute distress.    Appearance: Normal appearance. She is well-developed. She is obese. She is not diaphoretic.     Comments: Well-appearing,  comfortable, cooperative  HENT:     Head: Normocephalic and atraumatic.  Eyes:     General:        Right eye: No discharge.        Left eye: No discharge.     Conjunctiva/sclera: Conjunctivae normal.  Cardiovascular:     Rate and Rhythm: Normal rate.  Pulmonary:     Effort: Pulmonary effort is normal.  Skin:    General: Skin is warm and dry.     Findings: No erythema or rash.  Neurological:     Mental Status: She is alert and oriented to person, place, and time.  Psychiatric:        Mood and Affect: Mood normal.        Behavior: Behavior normal.        Thought Content: Thought content normal.     Comments: Well groomed, good eye contact, normal speech and thoughts     Results for orders placed or performed in visit on 07/22/24  TSH   Collection Time: 07/22/24  9:41 AM  Result Value Ref Range   TSH 3.340 0.450 - 4.500 uIU/mL  Ferritin   Collection Time: 07/22/24  9:41 AM  Result Value Ref Range   Ferritin 123 15 - 150 ng/mL  CBC   Collection Time: 07/22/24  9:41 AM  Result Value Ref Range   WBC 8.4 3.4 - 10.8 x10E3/uL   RBC 4.65 3.77 - 5.28 x10E6/uL   Hemoglobin 15.2 11.1 - 15.9 g/dL   Hematocrit 53.6 65.9 - 46.6 %   MCV 100 (H) 79 - 97 fL   MCH 32.7 26.6 - 33.0 pg   MCHC 32.8 31.5 - 35.7 g/dL   RDW 87.8 88.2 - 84.5 %   Platelets 261 150 - 450 x10E3/uL   *Note: Due to a large number of results and/or encounters for the requested time period, some results have not been displayed. A complete set of results can be found in Results Review.      Assessment & Plan:   Problem List Items Addressed This Visit     HLD (hyperlipidemia) - Primary   Relevant Medications   atenolol  (TENORMIN ) 50 MG tablet   Other Relevant Orders   Lipid panel   Comprehensive metabolic panel with GFR   Type 2 diabetes mellitus with other specified complication (HCC)   Other Visit Diagnoses       Essential (primary) hypertension       Relevant Medications  atenolol  (TENORMIN ) 50 MG  tablet   Other Relevant Orders   Comprehensive metabolic panel with GFR     Long-term current use of injectable noninsulin antidiabetic medication            COVID-19 infection - resolving Onset within past 1 week prior evaluation by urgent care / e visit, has had positive test. Symptoms improving now after treatment with Paxlovid  - Complete Paxlovid  course. - Consider azithromycin  if symptoms persist. (Already has rx available, has not started it yet)  Hyperlipidemia Mildly elevated cholesterol, statin therapy recommended due to diabetes history. (Note her insurance recommended this on a letter) - Order lipid panel for new updated results - Order liver enzyme tests. - Discuss statin therapy based on results. I advised her that all type 2 diabetic patients are recommended to take statin if tolerated, she is interested but we will determine strength and med selection after lab panel today  Type 2 diabetes mellitus Recent HbA1c of 7.1%. Discussed statins for cardiovascular risk reduction. - Discuss statin therapy after lipid panel results.  Hypertension - Refill atenolol  prescription at [facility].        Orders Placed This Encounter  Procedures   Lipid panel    Has the patient fasted?:   Yes   Comprehensive metabolic panel with GFR    Has the patient fasted?:   Yes    Meds ordered this encounter  Medications   atenolol  (TENORMIN ) 50 MG tablet    Sig: TAKE 1 TABLET(50 MG) BY MOUTH DAILY    Dispense:  90 tablet    Refill:  3    Follow up plan: Return for 3 month fasting lab > 1 week later Annual Physical.  Future labs ordered for 10/30/24   Marsa Officer, DO [facility] 07/29/2024, 9:47 AM

## 2024-07-30 ENCOUNTER — Ambulatory Visit: Payer: Self-pay | Admitting: Family Medicine

## 2024-07-30 ENCOUNTER — Inpatient Hospital Stay: Attending: Oncology

## 2024-07-30 DIAGNOSIS — D509 Iron deficiency anemia, unspecified: Secondary | ICD-10-CM

## 2024-07-30 DIAGNOSIS — E782 Mixed hyperlipidemia: Secondary | ICD-10-CM

## 2024-07-30 DIAGNOSIS — D519 Vitamin B12 deficiency anemia, unspecified: Secondary | ICD-10-CM | POA: Diagnosis not present

## 2024-07-30 MED ORDER — CYANOCOBALAMIN 1000 MCG/ML IJ SOLN
1000.0000 ug | INTRAMUSCULAR | Status: DC
  Administered 2024-07-30: 1000 ug via INTRAMUSCULAR
  Filled 2024-07-30: qty 1

## 2024-07-30 MED ORDER — ROSUVASTATIN CALCIUM 5 MG PO TABS: 5.0000 mg | ORAL_TABLET | Freq: Every day | ORAL | 3 refills | Status: AC

## 2024-07-31 DIAGNOSIS — Z1231 Encounter for screening mammogram for malignant neoplasm of breast: Secondary | ICD-10-CM | POA: Diagnosis not present

## 2024-07-31 LAB — HM MAMMOGRAPHY

## 2024-08-03 ENCOUNTER — Inpatient Hospital Stay: Payer: Medicare Other

## 2024-08-18 ENCOUNTER — Other Ambulatory Visit: Payer: Self-pay | Admitting: Family Medicine

## 2024-08-18 DIAGNOSIS — R7303 Prediabetes: Secondary | ICD-10-CM

## 2024-08-19 DIAGNOSIS — L821 Other seborrheic keratosis: Secondary | ICD-10-CM | POA: Diagnosis not present

## 2024-08-19 DIAGNOSIS — D2271 Melanocytic nevi of right lower limb, including hip: Secondary | ICD-10-CM | POA: Diagnosis not present

## 2024-08-19 DIAGNOSIS — D2261 Melanocytic nevi of right upper limb, including shoulder: Secondary | ICD-10-CM | POA: Diagnosis not present

## 2024-08-19 DIAGNOSIS — L218 Other seborrheic dermatitis: Secondary | ICD-10-CM | POA: Diagnosis not present

## 2024-08-19 DIAGNOSIS — D2272 Melanocytic nevi of left lower limb, including hip: Secondary | ICD-10-CM | POA: Diagnosis not present

## 2024-08-19 DIAGNOSIS — L309 Dermatitis, unspecified: Secondary | ICD-10-CM | POA: Diagnosis not present

## 2024-08-19 DIAGNOSIS — D225 Melanocytic nevi of trunk: Secondary | ICD-10-CM | POA: Diagnosis not present

## 2024-08-19 DIAGNOSIS — D2262 Melanocytic nevi of left upper limb, including shoulder: Secondary | ICD-10-CM | POA: Diagnosis not present

## 2024-08-19 NOTE — Telephone Encounter (Signed)
 Requested Prescriptions  Pending Prescriptions Disp Refills   Accu-Chek FastClix Lancets MISC [Pharmacy Med Name: ACCU-CHEK FASTCLIX LANCETS 102'S] 102 each 0    Sig: USE TO CHECK BLOOD SUGAR UP TO TWICE DAILY AS DIRECTED     Endocrinology: Diabetes - Testing Supplies Passed - 08/19/2024  8:33 AM      Passed - Valid encounter within last 12 months    Recent Outpatient Visits           3 weeks ago Mixed hyperlipidemia   [place] [facility] [place], Marsa PARAS, DO   4 months ago Chronic maxillary sinusitis   [place] [facility] [place], Marsa PARAS, DO   5 months ago Non-recurrent acute suppurative otitis media of right ear without spontaneous rupture of tympanic membrane   [place] [facility] [place], Marsa PARAS, [place]

## 2024-08-21 ENCOUNTER — Encounter: Payer: Self-pay | Admitting: Family Medicine

## 2024-08-21 ENCOUNTER — Telehealth: Admitting: Physician Assistant

## 2024-08-21 DIAGNOSIS — H6501 Acute serous otitis media, right ear: Secondary | ICD-10-CM | POA: Diagnosis not present

## 2024-08-21 DIAGNOSIS — M129 Arthropathy, unspecified: Secondary | ICD-10-CM

## 2024-08-21 DIAGNOSIS — T3695XA Adverse effect of unspecified systemic antibiotic, initial encounter: Secondary | ICD-10-CM

## 2024-08-21 DIAGNOSIS — J019 Acute sinusitis, unspecified: Secondary | ICD-10-CM | POA: Diagnosis not present

## 2024-08-21 DIAGNOSIS — M2550 Pain in unspecified joint: Secondary | ICD-10-CM

## 2024-08-21 DIAGNOSIS — B379 Candidiasis, unspecified: Secondary | ICD-10-CM

## 2024-08-21 DIAGNOSIS — B9689 Other specified bacterial agents as the cause of diseases classified elsewhere: Secondary | ICD-10-CM

## 2024-08-21 MED ORDER — AMOXICILLIN-POT CLAVULANATE 875-125 MG PO TABS: 1.0000 | ORAL_TABLET | Freq: Two times a day (BID) | ORAL | 0 refills | Status: DC

## 2024-08-21 MED ORDER — FLUCONAZOLE 150 MG PO TABS: 150.0000 mg | ORAL_TABLET | ORAL | 0 refills | Status: DC | PRN

## 2024-08-21 MED ORDER — CIPROFLOXACIN-DEXAMETHASONE 0.3-0.1 % OT SUSP: 4.0000 [drp] | Freq: Two times a day (BID) | OTIC | 0 refills | Status: AC

## 2024-08-21 NOTE — Progress Notes (Signed)
 Virtual Visit Consent   Amanda Webb, you are scheduled for a virtual visit with a [facility] provider today. Just as with appointments in the office, your consent must be obtained to participate. Your consent will be active for this visit and any virtual visit you may have with one of our providers in the next 365 days. If you have a MyChart account, a copy of this consent can be sent to you electronically.  As this is a virtual visit, video technology does not allow for your provider to perform a traditional examination. This may limit your provider's ability to fully assess your condition. If your provider identifies any concerns that need to be evaluated in person or the need to arrange testing (such as labs, EKG, etc.), we will make arrangements to do so. Although advances in technology are sophisticated, we cannot ensure that it will always work on either your end or our end. If the connection with a video visit is poor, the visit may have to be switched to a telephone visit. With either a video or telephone visit, we are not always able to ensure that we have a secure connection.  By engaging in this virtual visit, you consent to the provision of healthcare and authorize for your insurance to be billed (if applicable) for the services provided during this visit. Depending on your insurance coverage, you may receive a charge related to this service.  I need to obtain your verbal consent now. Are you willing to proceed with your visit today? LAKERIA STARKMAN has provided verbal consent on 08/21/2024 for a virtual visit (video or telephone). Delon CHRISTELLA Dickinson, PA-C  Date: 08/21/2024 5:24 PM   Virtual Visit via Video Note   I, Delon CHRISTELLA Dickinson, connected with  TOPANGA ALVELO  ([id number], 11-16-1971) on 08/21/24 at  5:15 PM EDT by a video-enabled telemedicine application and verified that I am speaking with the correct person using two identifiers.  Location: Patient: Virtual Visit Location  Patient: Mobile Provider: Virtual Visit Location Provider: Home Office   I discussed the limitations of evaluation and management by telemedicine and the availability of in person appointments. The patient expressed understanding and agreed to proceed.    History of Present Illness: Amanda Webb is a 53 y.o. who identifies as a female who was assigned female at birth, and is being seen today for sinus pain and ear pain.  HPI: Sinusitis This is a new problem. The current episode started 1 to 4 weeks ago. The problem has been gradually worsening since onset. There has been no fever. The pain is moderate. Associated symptoms include congestion, coughing (productive of post nasal drainage), diaphoresis (nighttime), ear pain (right), headaches, sinus pressure and a sore throat (mild). Pertinent negatives include no chills or hoarse voice. Treatments tried: zyrtec, flonase , azelastine  all daily. The treatment provided no relief.   Did have Covid in August 2025.  Problems:  Patient Active Problem List   Diagnosis Date Noted   Major depressive disorder, recurrent, in partial remission (HCC) 11/08/2020   Cough 10/21/2020   Status post laparoscopic cholecystectomy 07/28/2020   Morbid obesity (HCC) 07/05/2020   Hepatic steatosis 06/23/2020   Lymphedema 03/09/2020   Chronic venous insufficiency 03/09/2020   Bilateral lower extremity edema 02/16/2020   Female cystocele 10/21/2018   Urinary incontinence, mixed 10/21/2018   B12 deficiency 04/08/2018   Iron  deficiency anemia 03/14/2018   GAD (generalized anxiety disorder) 03/10/2018   Acute right-sided low back pain 10/28/2017  Tobacco abuse 05/09/2017   Multiple thyroid  nodules 12/28/2016   Simple chronic bronchitis (HCC) 12/13/2016   BMI 40.0-44.9, adult (HCC) 09/26/2016   HLD (hyperlipidemia) 09/26/2016   Vitamin D deficiency 09/26/2016   Type 2 diabetes mellitus with other specified complication (HCC) 06/17/2015   Candidiasis of skin and  nail 02/21/2015   Candidal intertrigo 02/21/2015   Cellulitis of right lower extremity 02/18/2015   Benign cyst of right kidney 01/17/2015   Chronic sinusitis 11/15/2014   Whiplash injury 09/03/2014   Heart palpitations 04/02/2014   Palpitations 04/02/2014   Allergic rhinitis 12/31/2013   Spondylosis of lumbar region without myelopathy or radiculopathy 12/28/2013   Chronic low back pain 09/28/2013   Other fatigue 09/28/2013   Hypothyroidism 09/28/2013   Alteration of body temperature 09/28/2013   Other fatigue 09/28/2013   Plantar fasciitis, bilateral 07/10/2013   Tarsal tunnel syndrome of right side 07/10/2013   Plantar fascial fibromatosis 07/10/2013   Encounter for surveillance of injectable contraceptive 05/13/2013   Diuresis excessive 03/24/2013   Polyuria 03/24/2013   Abnormal uterine and vaginal bleeding, unspecified 03/24/2013   Arthritis, multiple joint involvement 03/17/2013   Fibroblastic disorder 07/28/2012   Borderline personality disorder (HCC) 12/17/2010   Borderline personality disorder in adult [facility]) 12/17/2010   Essential hypertension 02/18/2010   Carpal tunnel syndrome on both sides 12/17/2006   Major depressive disorder, single episode 02/21/1998   Major depressive disorder, single episode 02/21/1998   Disk prolapse 04/17/1995   Depression with anxiety 03/18/1995   OCD (obsessive compulsive disorder) 03/18/1995   Severe anxiety with panic 03/18/1995    Allergies:  Allergies  Allergen Reactions   Bee Venom Anaphylaxis, Hives and Swelling    Carries Epi pen.    Cat Dander Itching, Other (See Comments) and Swelling    Allergic to trees, nuts, wheat, grass, cats & dogs - itchy watery eyes, swelling. Uses Zyrtec & Flonase  & Benadryl if really bad. Used to get allergy shots. Allergic to trees, nuts, wheat, grass, cats & dogs - itchy watery eyes, swelling. Uses Zyrtec & Flonase  & Benadryl if really bad. Used to get allergy shots.   Dog Epithelium Cough and  Shortness Of Breath   Dog Epithelium (Canis Lupus Familiaris) Shortness Of Breath   Dog Fennel Cough and Shortness Of Breath   Dog Fennel Allergy Skin Test Shortness Of Breath   Dust Mite Extract Cough and Shortness Of Breath   Tetracyclines & Related Nausea And Vomiting   Lactose Diarrhea and Nausea And Vomiting   Milk (Cow) Diarrhea and Nausea And Vomiting   Milk Protein Diarrhea and Nausea And Vomiting   Milk-Related Compounds Diarrhea and Nausea And Vomiting   Tape Other (See Comments) and Rash    Needs to use paper tape. Breaks out with severe rash, pulls skin off when using adhesive.   Wound Dressing Adhesive Rash    Other reaction(s): Other Needs to use paper tape. Breaks out with severe rash, pulls skin off when using adhesive.  Needs to use paper tape. Breaks out with severe rash, pulls skin off when using adhesive. Needs to use paper tape. Breaks out with severe rash, pulls skin off when using adhesive.   Medications:  Current Outpatient Medications:    amoxicillin -clavulanate (AUGMENTIN ) 875-125 MG tablet, Take 1 tablet by mouth 2 (two) times daily., Disp: 20 tablet, Rfl: 0   ciprofloxacin -dexamethasone  (CIPRODEX ) OTIC suspension, Place 4 drops into the right ear 2 (two) times daily for 7 days., Disp: 7.5 mL, Rfl: 0   fluconazole  (  DIFLUCAN ) 150 MG tablet, Take 1 tablet (150 mg total) by mouth every 3 (three) days as needed., Disp: 3 tablet, Rfl: 0   Accu-Chek FastClix Lancets MISC, USE TO CHECK BLOOD SUGAR UP TO TWICE DAILY AS DIRECTED, Disp: 102 each, Rfl: 0   albuterol  (PROVENTIL ) (2.5 MG/3ML) 0.083% nebulizer solution, Inhale 3ml (one vial) via nebulized route every four hours around the clock for 24 hours, followed by one vial every 6 hours for the next 24 hours. After 48 hours, take 3mL via neb every 6-8 hours PRN cough, wheeze (Patient taking differently: Take 2.5 mg by nebulization every 6 (six) hours as needed for wheezing or shortness of breath.), Disp: 150 mL, Rfl: 1    albuterol  (VENTOLIN  HFA) 108 (90 Base) MCG/ACT inhaler, INHALE 1 TO 2 PUFFS BY MOUTH EVERY 4 TO 6 HOURS AS NEEDED FOR SHORTNESS OF BREATH, Disp: 18 g, Rfl: 2   albuterol  (VENTOLIN  HFA) 108 (90 Base) MCG/ACT inhaler, Inhale 2 puffs into the lungs every 6 (six) hours as needed for wheezing or shortness of breath., Disp: 8 g, Rfl: 0   ARIPiprazole (ABILIFY) 2 MG tablet, Take 2 mg by mouth every morning. , Disp: , Rfl:    atenolol  (TENORMIN ) 50 MG tablet, TAKE 1 TABLET(50 MG) BY MOUTH DAILY, Disp: 90 tablet, Rfl: 3   azelastine  (ASTELIN ) 0.1 % nasal spray, Place 1 spray into both nostrils 2 (two) times daily. Use in each nostril as directed, Disp: 30 mL, Rfl: 5   cetirizine (ZYRTEC) 10 MG tablet, Take 10 mg by mouth daily with lunch., Disp: , Rfl:    Cyanocobalamin  (B-12 COMPLIANCE INJECTION IJ), Inject 1 Dose as directed every 30 (thirty) days., Disp: , Rfl:    fluticasone  (FLONASE ) 50 MCG/ACT nasal spray, Place 2 sprays into both nostrils daily. SHAKE LIQUID AND USE 2 SPRAYS IN EACH NOSTRIL EVERY DAY, Disp: 9.9 mL, Rfl: 6   fluvoxaMINE (LUVOX) 100 MG tablet, Take 150 mg by mouth every morning. , Disp: , Rfl:    gabapentin  (NEURONTIN ) 100 MG capsule, Take 200 mg by mouth daily., Disp: , Rfl:    glucose blood (ACCU-CHEK GUIDE) test strip, CHECK BLOOD SUGAR UP TO EVERY DAY AS DIRECTED, Disp: 200 strip, Rfl: 5   hydrOXYzine  (VISTARIL ) 25 MG capsule, TAKE 1 CAPSULE(25 MG) BY MOUTH EVERY 8 HOURS AS NEEDED, Disp: 30 capsule, Rfl: 0   lamoTRIgine (LAMICTAL) 200 MG tablet, Take 200 mg by mouth daily., Disp: , Rfl:    levothyroxine  (SYNTHROID ) 50 MCG tablet, Take 1 tablet (50 mcg total) by mouth daily before breakfast., Disp: 90 tablet, Rfl: 1   montelukast  (SINGULAIR ) 10 MG tablet, Take 1 tablet (10 mg total) by mouth at bedtime., Disp: 90 tablet, Rfl: 1   MOUNJARO  5 MG/0.5ML Pen, Inject 5 mg into the skin once a week., Disp: 2 mL, Rfl: 0   Multiple Vitamin (MULTIVITAMIN WITH MINERALS) TABS tablet, Take 1  tablet by mouth daily., Disp: , Rfl:    pantoprazole  (PROTONIX ) 20 MG tablet, TAKE 1 TABLET(20 MG) BY MOUTH DAILY, Disp: 90 tablet, Rfl: 1   rosuvastatin  (CRESTOR ) 5 MG tablet, Take 1 tablet (5 mg total) by mouth at bedtime., Disp: 90 tablet, Rfl: 3   TRELEGY ELLIPTA  100-62.5-25 MCG/ACT AEPB, INHALE 1 PUFF INTO THE LUNGS DAILY, Disp: 180 each, Rfl: 1   tretinoin  (RETIN-A ) 0.025 % cream, APPLY EXTERNALLY TO THE AFFECTED AREA AT BEDTIME AS NEEDED, Disp: 45 g, Rfl: 2 No current facility-administered medications for this visit.  Facility-Administered Medications Ordered  in Other Visits:    cyanocobalamin  (VITAMIN B12) injection 1,000 mcg, 1,000 mcg, Intramuscular, Once, Melanee Annah BROCKS, MD   cyanocobalamin  (VITAMIN B12) injection 1,000 mcg, 1,000 mcg, Intramuscular, Q30 days, Melanee Annah BROCKS, MD, 1,000 mcg at 02/05/24 9090  Observations/Objective: Patient is well-developed, well-nourished in no acute distress.  Resting comfortably at home.  Head is normocephalic, atraumatic.  No labored breathing.  Speech is clear and coherent with logical content.  Patient is alert and oriented at baseline.    Assessment and Plan: 1. Acute bacterial sinusitis (Primary) - amoxicillin -clavulanate (AUGMENTIN ) 875-125 MG tablet; Take 1 tablet by mouth 2 (two) times daily.  Dispense: 20 tablet; Refill: 0  2. Non-recurrent acute serous otitis media of right ear - ciprofloxacin -dexamethasone  (CIPRODEX ) OTIC suspension; Place 4 drops into the right ear 2 (two) times daily for 7 days.  Dispense: 7.5 mL; Refill: 0  3. Antibiotic-induced yeast infection - fluconazole  (DIFLUCAN ) 150 MG tablet; Take 1 tablet (150 mg total) by mouth every 3 (three) days as needed.  Dispense: 3 tablet; Refill: 0  - Worsening symptoms that have not responded to OTC medications.  - Will give Augmentin  and Ciprodex  - Continue allergy medications.  - Steam and humidifier can help - Stay well hydrated and get plenty of rest.  - Diflucan   given as prophylaxis as patient tends to get vaginal yeast infections with antibiotic use - Seek in person evaluation if no symptom improvement or if symptoms worsen   Follow Up Instructions: I discussed the assessment and treatment plan with the patient. The patient was provided an opportunity to ask questions and all were answered. The patient agreed with the plan and demonstrated an understanding of the instructions.  A copy of instructions were sent to the patient via MyChart unless otherwise noted below.    The patient was advised to call back or seek an in-person evaluation if the symptoms worsen or if the condition fails to improve as anticipated.    Delon CHRISTELLA Dickinson, PA-C

## 2024-08-21 NOTE — Patient Instructions (Signed)
 Amanda Webb, thank you for joining Delon CHRISTELLA Dickinson, PA-C for today's virtual visit.  While this provider is not your primary care provider (PCP), if your PCP is located in our provider database this encounter information will be shared with them immediately following your visit.   A [facility] MyChart account gives you access to today's visit and all your visits, tests, and labs performed at [facility]  click here if you don't have a [facility] MyChart account or go to [email or web address]  Consent: (Patient) Amanda Webb provided verbal consent for this virtual visit at the beginning of the encounter.  Current Medications:  Current Outpatient Medications:    amoxicillin -clavulanate (AUGMENTIN ) 875-125 MG tablet, Take 1 tablet by mouth 2 (two) times daily., Disp: 20 tablet, Rfl: 0   ciprofloxacin -dexamethasone  (CIPRODEX ) OTIC suspension, Place 4 drops into the right ear 2 (two) times daily for 7 days., Disp: 7.5 mL, Rfl: 0   fluconazole  (DIFLUCAN ) 150 MG tablet, Take 1 tablet (150 mg total) by mouth every 3 (three) days as needed., Disp: 3 tablet, Rfl: 0   Accu-Chek FastClix Lancets MISC, USE TO CHECK BLOOD SUGAR UP TO TWICE DAILY AS DIRECTED, Disp: 102 each, Rfl: 0   albuterol  (PROVENTIL ) (2.5 MG/3ML) 0.083% nebulizer solution, Inhale 3ml (one vial) via nebulized route every four hours around the clock for 24 hours, followed by one vial every 6 hours for the next 24 hours. After 48 hours, take 3mL via neb every 6-8 hours PRN cough, wheeze (Patient taking differently: Take 2.5 mg by nebulization every 6 (six) hours as needed for wheezing or shortness of breath.), Disp: 150 mL, Rfl: 1   albuterol  (VENTOLIN  HFA) 108 (90 Base) MCG/ACT inhaler, INHALE 1 TO 2 PUFFS BY MOUTH EVERY 4 TO 6 HOURS AS NEEDED FOR SHORTNESS OF BREATH, Disp: 18 g, Rfl: 2   albuterol  (VENTOLIN  HFA) 108 (90 Base) MCG/ACT inhaler, Inhale 2 puffs into the lungs every 6 (six) hours as needed for wheezing  or shortness of breath., Disp: 8 g, Rfl: 0   ARIPiprazole (ABILIFY) 2 MG tablet, Take 2 mg by mouth every morning. , Disp: , Rfl:    atenolol  (TENORMIN ) 50 MG tablet, TAKE 1 TABLET(50 MG) BY MOUTH DAILY, Disp: 90 tablet, Rfl: 3   azelastine  (ASTELIN ) 0.1 % nasal spray, Place 1 spray into both nostrils 2 (two) times daily. Use in each nostril as directed, Disp: 30 mL, Rfl: 5   cetirizine (ZYRTEC) 10 MG tablet, Take 10 mg by mouth daily with lunch., Disp: , Rfl:    Cyanocobalamin  (B-12 COMPLIANCE INJECTION IJ), Inject 1 Dose as directed every 30 (thirty) days., Disp: , Rfl:    fluticasone  (FLONASE ) 50 MCG/ACT nasal spray, Place 2 sprays into both nostrils daily. SHAKE LIQUID AND USE 2 SPRAYS IN EACH NOSTRIL EVERY DAY, Disp: 9.9 mL, Rfl: 6   fluvoxaMINE (LUVOX) 100 MG tablet, Take 150 mg by mouth every morning. , Disp: , Rfl:    gabapentin  (NEURONTIN ) 100 MG capsule, Take 200 mg by mouth daily., Disp: , Rfl:    glucose blood (ACCU-CHEK GUIDE) test strip, CHECK BLOOD SUGAR UP TO EVERY DAY AS DIRECTED, Disp: 200 strip, Rfl: 5   hydrOXYzine  (VISTARIL ) 25 MG capsule, TAKE 1 CAPSULE(25 MG) BY MOUTH EVERY 8 HOURS AS NEEDED, Disp: 30 capsule, Rfl: 0   lamoTRIgine (LAMICTAL) 200 MG tablet, Take 200 mg by mouth daily., Disp: , Rfl:    levothyroxine  (SYNTHROID ) 50 MCG tablet, Take 1 tablet (50 mcg total) by mouth  daily before breakfast., Disp: 90 tablet, Rfl: 1   montelukast  (SINGULAIR ) 10 MG tablet, Take 1 tablet (10 mg total) by mouth at bedtime., Disp: 90 tablet, Rfl: 1   MOUNJARO  5 MG/0.5ML Pen, Inject 5 mg into the skin once a week., Disp: 2 mL, Rfl: 0   Multiple Vitamin (MULTIVITAMIN WITH MINERALS) TABS tablet, Take 1 tablet by mouth daily., Disp: , Rfl:    pantoprazole  (PROTONIX ) 20 MG tablet, TAKE 1 TABLET(20 MG) BY MOUTH DAILY, Disp: 90 tablet, Rfl: 1   rosuvastatin  (CRESTOR ) 5 MG tablet, Take 1 tablet (5 mg total) by mouth at bedtime., Disp: 90 tablet, Rfl: 3   TRELEGY ELLIPTA  100-62.5-25 MCG/ACT  AEPB, INHALE 1 PUFF INTO THE LUNGS DAILY, Disp: 180 each, Rfl: 1   tretinoin  (RETIN-A ) 0.025 % cream, APPLY EXTERNALLY TO THE AFFECTED AREA AT BEDTIME AS NEEDED, Disp: 45 g, Rfl: 2 No current facility-administered medications for this visit.  Facility-Administered Medications Ordered in Other Visits:    cyanocobalamin  (VITAMIN B12) injection 1,000 mcg, 1,000 mcg, Intramuscular, Once, Melanee Annah BROCKS, MD   cyanocobalamin  (VITAMIN B12) injection 1,000 mcg, 1,000 mcg, Intramuscular, Q30 days, Melanee Annah BROCKS, MD, 1,000 mcg at 02/05/24 0909   Medications ordered in this encounter:  Meds ordered this encounter  Medications   amoxicillin -clavulanate (AUGMENTIN ) 875-125 MG tablet    Sig: Take 1 tablet by mouth 2 (two) times daily.    Dispense:  20 tablet    Refill:  0    Supervising Provider:   LAMPTEY, PHILIP O [[id number]]   ciprofloxacin -dexamethasone  (CIPRODEX ) OTIC suspension    Sig: Place 4 drops into the right ear 2 (two) times daily for 7 days.    Dispense:  7.5 mL    Refill:  0    Supervising Provider:   BLAISE ALEENE KIDD [id number]   fluconazole  (DIFLUCAN ) 150 MG tablet    Sig: Take 1 tablet (150 mg total) by mouth every 3 (three) days as needed.    Dispense:  3 tablet    Refill:  0    Supervising Provider:   LAMPTEY, PHILIP O [[id number]]     *If you need refills on other medications prior to your next appointment, please contact your pharmacy*  Follow-Up: Call back or seek an in-person evaluation if the symptoms worsen or if the condition fails to improve as anticipated.  [facility] [phone]  Other Instructions Otitis Media, Adult  Otitis media occurs when there is inflammation and fluid in the middle ear with signs and symptoms of an acute infection. The middle ear is a part of the ear that contains bones for hearing as well as air that helps send sounds to the brain. When infected fluid builds up in this space, it causes pressure and can lead to an ear  infection. The eustachian tube connects the middle ear to the back of the nose (nasopharynx) and normally allows air into the middle ear. If the eustachian tube becomes blocked, fluid can build up and become infected. What are the causes? This condition is caused by a blockage in the eustachian tube. This can be caused by mucus or by swelling of the tube. Problems that can cause a blockage include: A cold or other upper respiratory infection. Allergies. An irritant, such as tobacco smoke. Enlarged adenoids. The adenoids are areas of soft tissue located high in the back of the throat, behind the nose and the roof of the mouth. They are part of the body's defense system (immune system).  A mass in the nasopharynx. Damage to the ear caused by pressure changes (barotrauma). What increases the risk? You are more likely to develop this condition if you: Smoke or are exposed to tobacco smoke. Have an opening in the roof of your mouth (cleft palate). Have gastroesophageal reflux. Have an immune system disorder. What are the signs or symptoms? Symptoms of this condition include: Ear pain. Fever. Decreased hearing. Tiredness (lethargy). Fluid leaking from the ear, if the eardrum is ruptured or has burst. Ringing in the ear. How is this diagnosed?  This condition is diagnosed with a physical exam. During the exam, your health care provider will use an instrument called an otoscope to look in your ear and check for redness, swelling, and fluid. He or she will also ask about your symptoms. Your health care provider may also order tests, such as: A pneumatic otoscopy. This is a test to check the movement of the eardrum. It is done by squeezing a small amount of air into the ear. A tympanogram. This is a test that shows how well the eardrum moves in response to air pressure in the ear canal. It provides a graph for your health care provider to review. How is this treated? This condition can go away on  its own within 3-5 days. But if the condition is caused by a bacterial infection and does not go away on its own, or if it keeps coming back, your health care provider may: Prescribe antibiotic medicine to treat the infection. Prescribe or recommend medicines to control pain. Follow these instructions at home: Take over-the-counter and prescription medicines only as told by your health care provider. If you were prescribed an antibiotic medicine, take it as told by your health care provider. Do not stop taking the antibiotic even if you start to feel better. Keep all follow-up visits. This is important. Contact a health care provider if: You have bleeding from your nose. There is a lump on your neck. You are not feeling better in 5 days. You feel worse instead of better. Get help right away if: You have severe pain that is not controlled with medicine. You have swelling, redness, or pain around your ear. You have stiffness in your neck. A part of your face is not moving (paralyzed). The bone behind your ear (mastoid bone) is tender when you touch it. You develop a severe headache. Summary Otitis media is redness, soreness, and swelling of the middle ear, usually resulting in pain and decreased hearing. This condition can go away on its own within 3-5 days. If the problem does not go away in 3-5 days, your health care provider may give you medicines to treat the infection. If you were prescribed an antibiotic medicine, take it as told by your health care provider. Follow all instructions that were given to you by your health care provider. This information is not intended to replace advice given to you by your health care provider. Make sure you discuss any questions you have with your health care provider. Document Revised: 03/13/2021 Document Reviewed: 03/13/2021 Elsevier Patient Education  2024 [employer]   If you have been instructed to have an in-person evaluation today at a  local Urgent Care facility, please use the link below. It will take you to a list of all of our available [facility], including address, phone number and hours of operation. Please do not delay care.  [facility]  If you or a family member do  not have a primary care provider, use the link below to schedule a visit and establish care. When you choose a [facility] primary care physician or advanced practice provider, you gain a long-term partner in health. Find a Primary Care Provider  Learn more about [facility]'s in-office and virtual care options: [facility] - Get Care Now

## 2024-08-24 ENCOUNTER — Ambulatory Visit: Payer: Medicare Other | Admitting: Oncology

## 2024-08-24 ENCOUNTER — Other Ambulatory Visit: Payer: Medicare Other

## 2024-08-24 ENCOUNTER — Inpatient Hospital Stay: Payer: Medicare Other

## 2024-08-24 NOTE — Addendum Note (Signed)
 Addended by: EDMAN MARSA PARAS on: 08/24/2024 07:02 PM   Modules accepted: Orders

## 2024-08-27 ENCOUNTER — Other Ambulatory Visit

## 2024-08-27 DIAGNOSIS — M129 Arthropathy, unspecified: Secondary | ICD-10-CM | POA: Diagnosis not present

## 2024-08-27 DIAGNOSIS — M2550 Pain in unspecified joint: Secondary | ICD-10-CM | POA: Diagnosis not present

## 2024-08-29 LAB — ANA: Anti Nuclear Antibody (ANA): NEGATIVE

## 2024-08-29 LAB — C-REACTIVE PROTEIN: CRP: 8.8 mg/L — ABNORMAL HIGH (ref <8.0)

## 2024-08-29 LAB — SEDIMENTATION RATE: Sed Rate: 14 mm/h (ref 0–30)

## 2024-08-29 LAB — CYCLIC CITRUL PEPTIDE ANTIBODY, IGG: Cyclic Citrullin Peptide Ab: <16 U

## 2024-08-29 LAB — RHEUMATOID FACTOR: Rheumatoid fact SerPl-aCnc: <10 [IU]/mL (ref <14)

## 2024-08-31 ENCOUNTER — Ambulatory Visit: Payer: Self-pay | Admitting: Family Medicine

## 2024-08-31 ENCOUNTER — Ambulatory Visit

## 2024-09-02 ENCOUNTER — Inpatient Hospital Stay: Attending: Oncology

## 2024-09-02 ENCOUNTER — Other Ambulatory Visit: Payer: Self-pay

## 2024-09-02 ENCOUNTER — Telehealth: Payer: Self-pay | Admitting: Oncology

## 2024-09-02 DIAGNOSIS — D509 Iron deficiency anemia, unspecified: Secondary | ICD-10-CM | POA: Insufficient documentation

## 2024-09-02 DIAGNOSIS — E538 Deficiency of other specified B group vitamins: Secondary | ICD-10-CM | POA: Insufficient documentation

## 2024-09-02 LAB — IRON AND TIBC
Iron: 110 ug/dL (ref 28–170)
Saturation Ratios: 29 % (ref 10.4–31.8)
TIBC: 386 ug/dL (ref 250–450)
UIBC: 276 ug/dL

## 2024-09-02 LAB — CBC (CANCER CENTER ONLY)
HCT: 40.8 % (ref 36.0–46.0)
Hemoglobin: 14 g/dL (ref 12.0–15.0)
MCH: 32.3 pg (ref 26.0–34.0)
MCHC: 34.3 g/dL (ref 30.0–36.0)
MCV: 94.2 fL (ref 80.0–100.0)
Platelet Count: 204 K/uL (ref 150–400)
RBC: 4.33 MIL/uL (ref 3.87–5.11)
RDW: 12.1 % (ref 11.5–15.5)
WBC Count: 10.4 K/uL (ref 4.0–10.5)
nRBC: 0 % (ref 0.0–0.2)

## 2024-09-02 LAB — VITAMIN B12: Vitamin B-12: 492 pg/mL (ref 180–914)

## 2024-09-02 LAB — FERRITIN: Ferritin: 76 ng/mL (ref 11–307)

## 2024-09-02 NOTE — Telephone Encounter (Signed)
 Pt called and wants to do labs today and keep MD appt on Friday as scheduled.  There were openings today for lab, pt lab appt r/s to this morning.

## 2024-09-03 ENCOUNTER — Encounter: Payer: Self-pay | Admitting: Family Medicine

## 2024-09-03 DIAGNOSIS — E1169 Type 2 diabetes mellitus with other specified complication: Secondary | ICD-10-CM

## 2024-09-03 MED ORDER — MOUNJARO 5 MG/0.5ML ~~LOC~~ SOAJ: 5.0000 mg | SUBCUTANEOUS | 2 refills | Status: DC

## 2024-09-04 ENCOUNTER — Other Ambulatory Visit: Payer: Medicare Other

## 2024-09-04 ENCOUNTER — Encounter: Payer: Self-pay | Admitting: Oncology

## 2024-09-04 ENCOUNTER — Inpatient Hospital Stay: Payer: Medicare Other

## 2024-09-04 ENCOUNTER — Inpatient Hospital Stay (HOSPITAL_BASED_OUTPATIENT_CLINIC_OR_DEPARTMENT_OTHER): Payer: Medicare Other | Admitting: Oncology

## 2024-09-04 VITALS — BP 148/78 | HR 91 | Temp 98.4°F | Resp 20 | Ht 67.0 in | Wt 247.0 lb

## 2024-09-04 DIAGNOSIS — D509 Iron deficiency anemia, unspecified: Secondary | ICD-10-CM | POA: Diagnosis not present

## 2024-09-04 DIAGNOSIS — F4310 Post-traumatic stress disorder, unspecified: Secondary | ICD-10-CM | POA: Diagnosis not present

## 2024-09-04 DIAGNOSIS — E538 Deficiency of other specified B group vitamins: Secondary | ICD-10-CM | POA: Diagnosis not present

## 2024-09-04 MED ORDER — CYANOCOBALAMIN 1000 MCG/ML IJ SOLN
1000.0000 ug | INTRAMUSCULAR | Status: DC
  Administered 2024-09-04: 1000 ug via INTRAMUSCULAR
  Filled 2024-09-04: qty 1

## 2024-09-04 NOTE — Progress Notes (Signed)
 Hematology/Oncology Consult note [facility]  [phone] [phone]  Patient Care Team: Edman Marsa PARAS, DO as PCP - General (Family Medicine) Perla Evalene PARAS, MD as Consulting Physician (Cardiology) Melanee Annah BROCKS, MD as Consulting Physician (Hematology and Oncology) Pllc, [facility])   Name of the patient: Amanda Webb  [id number]  1971-09-29   Date of visit: 09/04/24  Diagnosis-iron  deficiency anemia  Chief complaint/ Reason for visit-routine follow-up of iron  deficiency anemia  Heme/Onc history: Patient is a 53 year old female with history of iron  and B12 deficiency anemia which has been attribute it to menorrhagia.  She has positive intrinsic factor antibody and she is on B12 injections which she gets at the cancer center   Interval history-overall patient is doing well and denies any complaints at this time.  Denies any blood loss in her stool or urine.  Denies any dark melanotic stools.  She has never had a colonoscopy but is considering getting Cologuard testing done  ECOG PS- 0 Pain scale- 0   Review of systems- Review of Systems  Constitutional:  Negative for chills, fever, malaise/fatigue and weight loss.  HENT:  Negative for congestion, ear discharge and nosebleeds.   Eyes:  Negative for blurred vision.  Respiratory:  Negative for cough, hemoptysis, sputum production, shortness of breath and wheezing.   Cardiovascular:  Negative for chest pain, palpitations, orthopnea and claudication.  Gastrointestinal:  Negative for abdominal pain, blood in stool, constipation, diarrhea, heartburn, melena, nausea and vomiting.  Genitourinary:  Negative for dysuria, flank pain, frequency, hematuria and urgency.  Musculoskeletal:  Negative for back pain, joint pain and myalgias.  Skin:  Negative for rash.  Neurological:  Negative for dizziness, tingling, focal weakness, seizures, weakness and headaches.   Endo/Heme/Allergies:  Does not bruise/bleed easily.  Psychiatric/Behavioral:  Negative for depression and suicidal ideas. The patient does not have insomnia.       Allergies  Allergen Reactions   Bee Venom Anaphylaxis, Hives and Swelling    Carries Epi pen.    Cat Dander Itching, Other (See Comments) and Swelling    Allergic to trees, nuts, wheat, grass, cats & dogs - itchy watery eyes, swelling. Uses Zyrtec & Flonase  & Benadryl if really bad. Used to get allergy shots. Allergic to trees, nuts, wheat, grass, cats & dogs - itchy watery eyes, swelling. Uses Zyrtec & Flonase  & Benadryl if really bad. Used to get allergy shots.   Dog Epithelium Cough and Shortness Of Breath   Dog Epithelium (Canis Lupus Familiaris) Shortness Of Breath   Dog Fennel Cough and Shortness Of Breath   Dog Fennel Allergy Skin Test Shortness Of Breath   Dust Mite Extract Cough and Shortness Of Breath   Tetracyclines & Related Nausea And Vomiting   Lactose Diarrhea and Nausea And Vomiting   Milk (Cow) Diarrhea and Nausea And Vomiting   Milk Protein Diarrhea and Nausea And Vomiting   Milk-Related Compounds Diarrhea and Nausea And Vomiting   Tape Other (See Comments) and Rash    Needs to use paper tape. Breaks out with severe rash, pulls skin off when using adhesive.   Wound Dressing Adhesive Rash    Other reaction(s): Other Needs to use paper tape. Breaks out with severe rash, pulls skin off when using adhesive.  Needs to use paper tape. Breaks out with severe rash, pulls skin off when using adhesive. Needs to use paper tape. Breaks out with severe rash, pulls skin off when using adhesive.  Past Medical History:  Diagnosis Date   Allergy    Anemia    Anxiety    Arrhythmia    Arthritis    Asthma    Cervical dysplasia    Cystitis    Depression    Dyspnea    GERD (gastroesophageal reflux disease)    Gross hematuria    Headache    Heart murmur    asymptomatic   History of methicillin resistant  staphylococcus aureus (MRSA) 2013   HLD (hyperlipidemia)    Hypertension    Hypothyroid    Lymphedema    Palpitations    Pernicious anemia    Pneumonia    Tobacco abuse    Type 2 diabetes mellitus (HCC)      Past Surgical History:  Procedure Laterality Date   CARPAL TUNNEL RELEASE Bilateral 2011   FOOT SURGERY Right 2013   Plantar fascia   KNEE ARTHROSCOPY WITH MEDIAL MENISECTOMY Right 07/11/2021   Procedure: Right knee arthroscopic partial medial meniscectomy;  Surgeon: Tobie Priest, MD;  Location: [facility];  Service: Orthopedics;  Laterality: Right;   KNEE ARTHROSCOPY WITH MEDIAL MENISECTOMY Right 07/03/2024   Procedure: ARTHROSCOPY, KNEE, WITH MEDIAL MENISCECTOMY;  Surgeon: Tobie Priest, MD;  Location: [facility];  Service: Orthopedics;  Laterality: Right;  Right knee arthroscopy, partial medial meniscectomy, and subchondroplasty of the medial tibial plateau   KNEE ARTHROSCOPY WITH SUBCHONDROPLASTY Right 07/03/2024   Procedure: ARTHROSCOPY, KNEE, WITH SUBCHONDROPLASTY;  Surgeon: Tobie Priest, MD;  Location: [facility];  Service: Orthopedics;  Laterality: Right;  Right knee arthroscopy, partial medial meniscectomy, and subchondroplasty of the medial tibial plateau   ROBOTIC ASSISTED LAPAROSCOPIC CHOLECYSTECTOMY  07/15/2020   Dr Lane   TONSILLECTOMY  1979   with ear tubes    Social History   Socioeconomic History   Marital status: Divorced    Spouse name: Not on file   Number of children: Not on file   Years of education: Some college   Highest education level: Associate degree: occupational, Scientist, product/process development, or vocational program  Occupational History   Occupation: disability   Tobacco Use   Smoking status: Every Day    Current packs/day: 1.00    Average packs/day: 1 pack/day for 32.0 years (32.0 ttl pk-yrs)    Types: Cigarettes    Passive exposure: Current   Smokeless tobacco: Never   Tobacco comments:    1 ppd/ 11/22/2020  Vaping Use   Vaping status: Never  Used  Substance and Sexual Activity   Alcohol use: Yes    Alcohol/week: 3.0 standard drinks of alcohol    Types: 3 Glasses of wine per week    Comment: per week   Drug use: No   Sexual activity: Yes    Birth control/protection: Injection  Other Topics Concern   Not on file  Social History Narrative   Lives with mom   Social Drivers of Health   Financial Resource Strain: Low Risk  (07/24/2024)   Overall Financial Resource Strain (CARDIA)    Difficulty of Paying Living Expenses: Not hard at all  Food Insecurity: No Food Insecurity (07/24/2024)   Hunger Vital Sign    Worried About Running Out of Food in the Last Year: Never true    Ran Out of Food in the Last Year: Never true  Transportation Needs: No Transportation Needs (07/24/2024)   PRAPARE - Administrator, Civil Service (Medical): No    Lack of Transportation (Non-Medical): No  Physical Activity: Insufficiently Active (07/24/2024)   Exercise  Vital Sign    Days of Exercise per Week: 1 day    Minutes of Exercise per Session: 10 min  Stress: Stress Concern Present (07/24/2024)   [employer] of Occupational Health - Occupational Stress Questionnaire    Feeling of Stress: Very much  Social Connections: Moderately Isolated (07/24/2024)   Social Connection and Isolation Panel    Frequency of Communication with Friends and Family: Once a week    Frequency of Social Gatherings with Friends and Family: Never    Attends Religious Services: More than 4 times per year    Active Member of [employer] or Organizations: No    Attends Engineer, structural: Not on file    Marital Status: Married  Catering manager Violence: Not At Risk (07/07/2019)   Humiliation, Afraid, Rape, and Kick questionnaire    Fear of Current or Ex-Partner: No    Emotionally Abused: No    Physically Abused: No    Sexually Abused: No    Family History  Problem Relation Age of Onset   Depression Mother    Mental illness Mother    Alcohol abuse  Mother    Lung cancer Mother    Cancer Maternal Grandmother    Diabetes Maternal Grandmother    Ovarian cancer Maternal Grandmother    Kidney disease Maternal Grandfather    Diabetes Father    Mental illness Father    Depression Father    Drug abuse Father    Depression Paternal Grandfather    Drug abuse Paternal Grandfather    Bladder Cancer Neg Hx    Breast cancer Neg Hx      Current Outpatient Medications:    albuterol  (PROVENTIL ) (2.5 MG/3ML) 0.083% nebulizer solution, Inhale 3ml (one vial) via nebulized route every four hours around the clock for 24 hours, followed by one vial every 6 hours for the next 24 hours. After 48 hours, take 3mL via neb every 6-8 hours PRN cough, wheeze (Patient taking differently: Take 2.5 mg by nebulization every 6 (six) hours as needed for wheezing or shortness of breath.), Disp: 150 mL, Rfl: 1   albuterol  (VENTOLIN  HFA) 108 (90 Base) MCG/ACT inhaler, INHALE 1 TO 2 PUFFS BY MOUTH EVERY 4 TO 6 HOURS AS NEEDED FOR SHORTNESS OF BREATH, Disp: 18 g, Rfl: 2   albuterol  (VENTOLIN  HFA) 108 (90 Base) MCG/ACT inhaler, Inhale 2 puffs into the lungs every 6 (six) hours as needed for wheezing or shortness of breath., Disp: 8 g, Rfl: 0   ARIPiprazole (ABILIFY) 2 MG tablet, Take 2 mg by mouth every morning. , Disp: , Rfl:    atenolol  (TENORMIN ) 50 MG tablet, TAKE 1 TABLET(50 MG) BY MOUTH DAILY, Disp: 90 tablet, Rfl: 3   azelastine  (ASTELIN ) 0.1 % nasal spray, Place 1 spray into both nostrils 2 (two) times daily. Use in each nostril as directed, Disp: 30 mL, Rfl: 5   cetirizine (ZYRTEC) 10 MG tablet, Take 10 mg by mouth daily with lunch., Disp: , Rfl:    Cyanocobalamin  (B-12 COMPLIANCE INJECTION IJ), Inject 1 Dose as directed every 30 (thirty) days., Disp: , Rfl:    fluconazole  (DIFLUCAN ) 150 MG tablet, Take 1 tablet (150 mg total) by mouth every 3 (three) days as needed., Disp: 3 tablet, Rfl: 0   fluticasone  (FLONASE ) 50 MCG/ACT nasal spray, Place 2 sprays into both  nostrils daily. SHAKE LIQUID AND USE 2 SPRAYS IN EACH NOSTRIL EVERY DAY, Disp: 9.9 mL, Rfl: 6   fluvoxaMINE (LUVOX) 100 MG tablet, Take 150 mg  by mouth every morning. , Disp: , Rfl:    gabapentin  (NEURONTIN ) 100 MG capsule, Take 200 mg by mouth daily., Disp: , Rfl:    glucose blood (ACCU-CHEK GUIDE) test strip, CHECK BLOOD SUGAR UP TO EVERY DAY AS DIRECTED, Disp: 200 strip, Rfl: 5   hydrOXYzine  (VISTARIL ) 25 MG capsule, TAKE 1 CAPSULE(25 MG) BY MOUTH EVERY 8 HOURS AS NEEDED, Disp: 30 capsule, Rfl: 0   lamoTRIgine (LAMICTAL) 200 MG tablet, Take 200 mg by mouth daily., Disp: , Rfl:    levothyroxine  (SYNTHROID ) 50 MCG tablet, Take 1 tablet (50 mcg total) by mouth daily before breakfast., Disp: 90 tablet, Rfl: 1   montelukast  (SINGULAIR ) 10 MG tablet, Take 1 tablet (10 mg total) by mouth at bedtime., Disp: 90 tablet, Rfl: 1   MOUNJARO  5 MG/0.5ML Pen, Inject 5 mg into the skin once a week., Disp: 2 mL, Rfl: 2   Multiple Vitamin (MULTIVITAMIN WITH MINERALS) TABS tablet, Take 1 tablet by mouth daily., Disp: , Rfl:    pantoprazole  (PROTONIX ) 20 MG tablet, TAKE 1 TABLET(20 MG) BY MOUTH DAILY, Disp: 90 tablet, Rfl: 1   rosuvastatin  (CRESTOR ) 5 MG tablet, Take 1 tablet (5 mg total) by mouth at bedtime., Disp: 90 tablet, Rfl: 3   TRELEGY ELLIPTA  100-62.5-25 MCG/ACT AEPB, INHALE 1 PUFF INTO THE LUNGS DAILY, Disp: 180 each, Rfl: 1   tretinoin  (RETIN-A ) 0.025 % cream, APPLY EXTERNALLY TO THE AFFECTED AREA AT BEDTIME AS NEEDED, Disp: 45 g, Rfl: 2   Accu-Chek FastClix Lancets MISC, USE TO CHECK BLOOD SUGAR UP TO TWICE DAILY AS DIRECTED, Disp: 102 each, Rfl: 0   amoxicillin -clavulanate (AUGMENTIN ) 875-125 MG tablet, Take 1 tablet by mouth 2 (two) times daily. (Patient not taking: Reported on 09/04/2024), Disp: 20 tablet, Rfl: 0 No current facility-administered medications for this visit.  Facility-Administered Medications Ordered in Other Visits:    cyanocobalamin  (VITAMIN B12) injection 1,000 mcg, 1,000 mcg,  Intramuscular, Once, Melanee Annah BROCKS, MD   cyanocobalamin  (VITAMIN B12) injection 1,000 mcg, 1,000 mcg, Intramuscular, Q30 days, Melanee Annah BROCKS, MD, 1,000 mcg at 02/05/24 9090   cyanocobalamin  (VITAMIN B12) injection 1,000 mcg, 1,000 mcg, Intramuscular, Q30 days, Melanee Annah BROCKS, MD, 1,000 mcg at 09/04/24 1022  Physical exam:  Vitals:   09/04/24 1000  BP: (!) 148/78  Pulse: 91  Resp: 20  Temp: 98.4 F (36.9 C)  Weight: 247 lb (112 kg)  Height: 5' 7 (1.702 m)   Physical Exam Cardiovascular:     Rate and Rhythm: Normal rate and regular rhythm.     Heart sounds: Normal heart sounds.  Pulmonary:     Effort: Pulmonary effort is normal.     Breath sounds: Normal breath sounds.  Skin:    General: Skin is warm and dry.  Neurological:     Mental Status: She is alert and oriented to person, place, and time.      I have personally reviewed labs listed below:    Latest Ref Rng & Units 07/29/2024   10:02 AM  CMP  Glucose 65 - 99 mg/dL 846   BUN 7 - 25 mg/dL 7   Creatinine 9.49 - 8.96 mg/dL 9.39   Sodium 864 - 853 mmol/L 139   Potassium 3.5 - 5.3 mmol/L 4.1   Chloride 98 - 110 mmol/L 103   CO2 20 - 32 mmol/L 24   Calcium  8.6 - 10.4 mg/dL 9.5   Total Protein 6.1 - 8.1 g/dL 7.3   Total Bilirubin 0.2 - 1.2 mg/dL 0.4  AST 10 - 35 U/L 18   ALT 6 - 29 U/L 26       Latest Ref Rng & Units 09/02/2024   11:04 AM  CBC  WBC 4.0 - 10.5 K/uL 10.4   Hemoglobin 12.0 - 15.0 g/dL 85.9   Hematocrit 63.9 - 46.0 % 40.8   Platelets 150 - 400 K/uL 204     Assessment and plan- Patient is a 53 y.o. female here for a routine follow-up of iron  deficiency anemia and B12 deficiency anemia  Patient is not presently anemic with an H&H of 14/40.8.  She will continue to receive monthly B12 injections at the cancer center.  Iron  studies are presently within normal limits and she does not require any IV iron  at this time.  I will see her back on a yearly basis with labs   Visit Diagnosis 1. Iron   deficiency anemia, unspecified iron  deficiency anemia type      Dr. Annah Skene, MD, MPH [facility] at [facility] [phone] 09/04/2024 12:15 PM

## 2024-09-04 NOTE — Progress Notes (Signed)
 Patient states she's doing well, recovering from knee surgery that was on on 07/03/24. No new or acute concerns at this time.

## 2024-09-09 DIAGNOSIS — S83241D Other tear of medial meniscus, current injury, right knee, subsequent encounter: Secondary | ICD-10-CM | POA: Diagnosis not present

## 2024-09-21 DIAGNOSIS — K529 Noninfective gastroenteritis and colitis, unspecified: Secondary | ICD-10-CM | POA: Diagnosis not present

## 2024-09-21 DIAGNOSIS — R509 Fever, unspecified: Secondary | ICD-10-CM | POA: Diagnosis not present

## 2024-09-28 ENCOUNTER — Other Ambulatory Visit: Payer: Self-pay

## 2024-09-28 DIAGNOSIS — N951 Menopausal and female climacteric states: Secondary | ICD-10-CM

## 2024-09-30 ENCOUNTER — Other Ambulatory Visit: Payer: Self-pay

## 2024-09-30 ENCOUNTER — Other Ambulatory Visit

## 2024-09-30 DIAGNOSIS — N951 Menopausal and female climacteric states: Secondary | ICD-10-CM

## 2024-10-01 LAB — FOLLICLE STIMULATING HORMONE: FSH: 34.6 m[IU]/mL

## 2024-10-02 ENCOUNTER — Ambulatory Visit: Payer: Self-pay | Admitting: Certified Nurse Midwife

## 2024-10-05 ENCOUNTER — Inpatient Hospital Stay: Attending: Oncology

## 2024-10-05 DIAGNOSIS — E538 Deficiency of other specified B group vitamins: Secondary | ICD-10-CM | POA: Insufficient documentation

## 2024-10-05 DIAGNOSIS — D509 Iron deficiency anemia, unspecified: Secondary | ICD-10-CM

## 2024-10-05 MED ORDER — CYANOCOBALAMIN 1000 MCG/ML IJ SOLN
1000.0000 ug | INTRAMUSCULAR | Status: DC
  Administered 2024-10-05: 1000 ug via INTRAMUSCULAR
  Filled 2024-10-05: qty 1

## 2024-10-07 ENCOUNTER — Ambulatory Visit (INDEPENDENT_AMBULATORY_CARE_PROVIDER_SITE_OTHER)

## 2024-10-07 DIAGNOSIS — Z23 Encounter for immunization: Secondary | ICD-10-CM

## 2024-10-08 ENCOUNTER — Ambulatory Visit: Admitting: Family Medicine

## 2024-10-13 DIAGNOSIS — I10 Essential (primary) hypertension: Secondary | ICD-10-CM | POA: Diagnosis not present

## 2024-10-13 DIAGNOSIS — H16223 Keratoconjunctivitis sicca, not specified as Sjogren's, bilateral: Secondary | ICD-10-CM | POA: Diagnosis not present

## 2024-10-13 DIAGNOSIS — E119 Type 2 diabetes mellitus without complications: Secondary | ICD-10-CM | POA: Diagnosis not present

## 2024-10-13 DIAGNOSIS — H40053 Ocular hypertension, bilateral: Secondary | ICD-10-CM | POA: Diagnosis not present

## 2024-10-13 LAB — OPHTHALMOLOGY REPORT-SCANNED

## 2024-10-19 DIAGNOSIS — Z23 Encounter for immunization: Secondary | ICD-10-CM | POA: Diagnosis not present

## 2024-10-19 DIAGNOSIS — S50811A Abrasion of right forearm, initial encounter: Secondary | ICD-10-CM | POA: Diagnosis not present

## 2024-10-19 DIAGNOSIS — X58XXXA Exposure to other specified factors, initial encounter: Secondary | ICD-10-CM | POA: Diagnosis not present

## 2024-10-20 ENCOUNTER — Ambulatory Visit

## 2024-10-22 ENCOUNTER — Emergency Department

## 2024-10-22 ENCOUNTER — Other Ambulatory Visit: Payer: Self-pay

## 2024-10-22 ENCOUNTER — Telehealth

## 2024-10-22 ENCOUNTER — Emergency Department
Admission: EM | Admit: 2024-10-22 | Discharge: 2024-10-22 | Disposition: A | Discharge status: Home / Self Care | Attending: Emergency Medicine | Admitting: Emergency Medicine

## 2024-10-22 ENCOUNTER — Other Ambulatory Visit: Payer: Self-pay | Admitting: Family Medicine

## 2024-10-22 DIAGNOSIS — R079 Chest pain, unspecified: Secondary | ICD-10-CM | POA: Diagnosis not present

## 2024-10-22 DIAGNOSIS — R0789 Other chest pain: Secondary | ICD-10-CM | POA: Diagnosis present

## 2024-10-22 DIAGNOSIS — E039 Hypothyroidism, unspecified: Secondary | ICD-10-CM | POA: Diagnosis not present

## 2024-10-22 DIAGNOSIS — I10 Essential (primary) hypertension: Secondary | ICD-10-CM | POA: Insufficient documentation

## 2024-10-22 DIAGNOSIS — F17200 Nicotine dependence, unspecified, uncomplicated: Secondary | ICD-10-CM | POA: Insufficient documentation

## 2024-10-22 DIAGNOSIS — J45909 Unspecified asthma, uncomplicated: Secondary | ICD-10-CM | POA: Insufficient documentation

## 2024-10-22 DIAGNOSIS — E119 Type 2 diabetes mellitus without complications: Secondary | ICD-10-CM | POA: Insufficient documentation

## 2024-10-22 LAB — CBC
HCT: 45.2 % (ref 36.0–46.0)
Hemoglobin: 15 g/dL (ref 12.0–15.0)
MCH: 32.1 pg (ref 26.0–34.0)
MCHC: 33.2 g/dL (ref 30.0–36.0)
MCV: 96.8 fL (ref 80.0–100.0)
Platelets: 249 K/uL (ref 150–400)
RBC: 4.67 MIL/uL (ref 3.87–5.11)
RDW: 12.9 % (ref 11.5–15.5)
WBC: 9.3 K/uL (ref 4.0–10.5)
nRBC: 0 % (ref 0.0–0.2)

## 2024-10-22 LAB — BASIC METABOLIC PANEL WITH GFR
Anion gap: 13 (ref 5–15)
BUN: 8 mg/dL (ref 6–20)
CO2: 23 mmol/L (ref 22–32)
Calcium: 8.8 mg/dL — ABNORMAL LOW (ref 8.9–10.3)
Chloride: 103 mmol/L (ref 98–111)
Creatinine, Ser: 0.63 mg/dL (ref 0.44–1.00)
GFR, Estimated: >60 mL/min (ref >60)
Glucose, Bld: 121 mg/dL — ABNORMAL HIGH (ref 70–99)
Potassium: 3.9 mmol/L (ref 3.5–5.1)
Sodium: 139 mmol/L (ref 135–145)

## 2024-10-22 LAB — TROPONIN I (HIGH SENSITIVITY): Troponin I (High Sensitivity): 4 ng/L (ref <18)

## 2024-10-22 MED ORDER — FAMOTIDINE 20 MG PO TABS
40.0000 mg | ORAL_TABLET | Freq: Once | ORAL | Status: AC
  Administered 2024-10-22: 40 mg via ORAL
  Filled 2024-10-22: qty 2

## 2024-10-22 MED ORDER — CALCIUM CARBONATE ANTACID 500 MG PO CHEW
1.0000 | CHEWABLE_TABLET | Freq: Once | ORAL | Status: AC
  Administered 2024-10-22: 200 mg via ORAL
  Filled 2024-10-22: qty 1

## 2024-10-22 MED ORDER — FAMOTIDINE 20 MG PO TABS: 20.0000 mg | ORAL_TABLET | Freq: Two times a day (BID) | ORAL | 0 refills | Status: AC

## 2024-10-22 NOTE — Discharge Instructions (Addendum)
 Your EG, chest x-ray, and lab tests are all normal today.  Take antacid medicine and follow-up with your doctor for further evaluation of your symptoms.

## 2024-10-22 NOTE — ED Provider Notes (Signed)
 [facility] Provider Note    Event Date/Time   First MD Initiated Contact with Patient 10/22/24 [phone]     (approximate)   History   Chief Complaint: Chest Pain   HPI  Amanda Webb is a 53 y.o. female with a history of mid and left-sided chest pain that started last night, felt dull, radiates up to the neck.  History of hypertension, hyperlipidemia, GERD, diabetes.  Symptoms have been consistent throughout the night.  No shortness of breath diaphoresis or vomiting.  Not exertional, not pleuritic.  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        Past Medical History:  Diagnosis Date   Allergy    Anemia    Anxiety    Arrhythmia    Arthritis    Asthma    Cervical dysplasia    Cystitis    Depression    Dyspnea    GERD (gastroesophageal reflux disease)    Gross hematuria    Headache    Heart murmur    asymptomatic   History of methicillin resistant staphylococcus aureus (MRSA) 2013   HLD (hyperlipidemia)    Hypertension    Hypothyroid    Lymphedema    Palpitations    Pernicious anemia    Pneumonia    Tobacco abuse    Type 2 diabetes mellitus [facility])     Current Outpatient Rx   Order #: [id number] Class: Normal   Order #: [id number] Class: Normal   Order #: [id number] Class: Normal   Order #: [id number] Class: Normal   Order #: [id number] Class: Normal   Order #: [id number] Class: Normal   Order #: [id number] Class: Historical Med   Order #: [id number] Class: Normal   Order #: [id number] Class: Normal   Order #: [id number] Class: Historical Med   Order #: [id number] Class: Historical Med   Order #: [id number] Class: Normal   Order #: [id number] Class: Normal   Order #: [id number] Class: Historical Med   Order #: [id number] Class: Historical Med   Order #: [id number] Class: Normal   Order #: [id number] Class: Normal   Order #: [id number] Class: Historical Med   Order #: [id number] Class: Normal   Order #: [id number] Class: Normal    Order #: [id number] Class: Normal   Order #: [id number] Class: Historical Med   Order #: [id number] Class: Normal   Order #: [id number] Class: Normal   Order #: [id number] Class: Normal   Order #: [id number] Class: Normal    Past Surgical History:  Procedure Laterality Date   CARPAL TUNNEL RELEASE Bilateral 2011   FOOT SURGERY Right 2013   Plantar fascia   KNEE ARTHROSCOPY WITH MEDIAL MENISECTOMY Right 07/11/2021   Procedure: Right knee arthroscopic partial medial meniscectomy;  Surgeon: Tobie Priest, MD;  Location: [facility];  Service: Orthopedics;  Laterality: Right;   KNEE ARTHROSCOPY WITH MEDIAL MENISECTOMY Right 07/03/2024   Procedure: ARTHROSCOPY, KNEE, WITH MEDIAL MENISCECTOMY;  Surgeon: Tobie Priest, MD;  Location: [facility];  Service: Orthopedics;  Laterality: Right;  Right knee arthroscopy, partial medial meniscectomy, and subchondroplasty of the medial tibial plateau   KNEE ARTHROSCOPY WITH SUBCHONDROPLASTY Right 07/03/2024   Procedure: ARTHROSCOPY, KNEE, WITH SUBCHONDROPLASTY;  Surgeon: Tobie Priest, MD;  Location: [facility];  Service: Orthopedics;  Laterality: Right;  Right knee arthroscopy, partial medial meniscectomy, and subchondroplasty of the medial tibial plateau   ROBOTIC ASSISTED LAPAROSCOPIC CHOLECYSTECTOMY  07/15/2020   Dr Lane   TONSILLECTOMY  1979   with ear tubes    Physical Exam   Triage Vital Signs: ED Triage Vitals  Encounter Vitals Group  BP 10/22/24 0857 (!) 183/95     Girls Systolic BP Percentile --      Girls Diastolic BP Percentile --      Boys Systolic BP Percentile --      Boys Diastolic BP Percentile --      Pulse Rate 10/22/24 0857 86     Resp 10/22/24 0857 18     Temp 10/22/24 0857 98.6 F (37 C)     Temp Source 10/22/24 0857 Oral     SpO2 10/22/24 0857 98 %     Weight 10/22/24 0858 246 lb 14.6 oz (112 kg)     Height --      Head Circumference --      Peak Flow --      Pain Score 10/22/24 0857 7     Pain Loc --      Pain Education  --      Exclude from Growth Chart --     Most recent vital signs: Vitals:   10/22/24 0931 10/22/24 1113  BP:  (!) 175/95  Pulse:  90  Resp:  18  Temp:    SpO2: 97% 99%    General: Awake, no distress.  CV:  Good peripheral perfusion.  Regular rate rhythm, normal distal pulses Resp:  Normal effort.  Clear lungs Abd:  No distention.  Soft nontender Other:  No lower extremity edema   ED Results / Procedures / Treatments   Labs (all labs ordered are listed, but only abnormal results are displayed) Labs Reviewed  BASIC METABOLIC PANEL WITH GFR - Abnormal; Notable for the following components:      Result Value   Glucose, Bld 121 (*)    Calcium  8.8 (*)    All other components within normal limits  CBC  TROPONIN I (HIGH SENSITIVITY)     EKG Interpreted by me Sinus rhythm rate of 84.  Normal axis, first-degree AV block.  Normal QRS and ST segments.  Inferolateral T wave inversions.  No significant change compared to previous EKG on June 30, 2024.   RADIOLOGY Chest x-ray interpreted by me, appears normal.  Radiology report reviewed   PROCEDURES:  Procedures   MEDICATIONS ORDERED IN ED: Medications  calcium  carbonate (TUMS - dosed in mg elemental calcium ) chewable tablet 200 mg of elemental calcium  (200 mg of elemental calcium  Oral Given 10/22/24 0949)  famotidine (PEPCID) tablet 40 mg (40 mg Oral Given 10/22/24 0949)     IMPRESSION / MDM / ASSESSMENT AND PLAN / ED COURSE  I reviewed the triage vital signs and the nursing notes.  DDx: Non-STEMI, GERD, electrolyte derangement, anemia, AKI, anxiety/stress response  Patient's presentation is most consistent with acute presentation with potential threat to life or bodily function.  Patient presents with atypical chest pain, has risk factors for cardiac disease.  EKG and troponin do not show any acute issues today.  With the duration of her pain and atypical nature, workup is reassuring and she is medically stable for  discharge.       FINAL CLINICAL IMPRESSION(S) / ED DIAGNOSES   Final diagnoses:  Nonspecific chest pain     Rx / DC Orders   ED Discharge Orders          Ordered    famotidine (PEPCID) 20 MG tablet  2 times daily        10/22/24 1108             Note:  This document was prepared using Dragon voice recognition software  and may include unintentional dictation errors.   Viviann Pastor, MD 10/22/24 [phone]

## 2024-10-22 NOTE — ED Triage Notes (Signed)
 C/O mid/left sided chest pain since last night. Today pain also to left shoulder, scapula and left jaw. Patient states she is under a lot of stress currently.  AAOx3. Skin warm and dry. Tearful in triage. No SOB/ DOE

## 2024-10-23 ENCOUNTER — Encounter: Payer: Self-pay | Admitting: Family Medicine

## 2024-10-23 ENCOUNTER — Other Ambulatory Visit: Payer: Self-pay

## 2024-10-23 ENCOUNTER — Ambulatory Visit (INDEPENDENT_AMBULATORY_CARE_PROVIDER_SITE_OTHER): Admitting: Family Medicine

## 2024-10-23 VITALS — BP 158/92 | HR 83 | Ht 67.0 in | Wt 241.0 lb

## 2024-10-23 DIAGNOSIS — I10 Essential (primary) hypertension: Secondary | ICD-10-CM

## 2024-10-23 DIAGNOSIS — E039 Hypothyroidism, unspecified: Secondary | ICD-10-CM

## 2024-10-23 MED ORDER — METOPROLOL SUCCINATE ER 50 MG PO TB24: 50.0000 mg | ORAL_TABLET | Freq: Every day | ORAL | 3 refills | Status: AC

## 2024-10-23 MED ORDER — VALSARTAN 160 MG PO TABS: 160.0000 mg | ORAL_TABLET | Freq: Every day | ORAL | 3 refills | Status: AC

## 2024-10-23 MED ORDER — LEVOTHYROXINE SODIUM 50 MCG PO TABS: 50.0000 ug | ORAL_TABLET | Freq: Every day | ORAL | 1 refills | Status: AC

## 2024-10-23 NOTE — Patient Instructions (Addendum)
 Thank you for coming to the office today.  Stop Atenolol  Start Metoprolol XL 50mg  daily Start Valsartan 160mg  daily  New blood work later in November.  FUTURE - message me when ready  Coronary Calcium  Score Cardiac CT Scan. This is a screening test for patients aged 52-50+ with cardiovascular risk factors or who are healthy but would be interested in Cardiovascular Screening for heart disease. Even if there is a family history of heart disease, this imaging can be useful. Typically it can be done every 5+ years or at a different timeline we agree on  The scan will look at the chest and mainly focus on the heart and identify early signs of calcium  build up or blockages within the heart arteries. It is not 100% accurate for identifying blockages or heart disease, but it is useful to help us  predict who may have some early changes or be at risk in the future for a heart attack or cardiovascular problem.  The results are reviewed by a Cardiologist and they will document the results. It should become available on MyChart. Typically the results are divided into percentiles based on other patients of the same demographic and age. So it will compare your risk to others similar to you. If you have a higher score >99 or higher percentile >75%tile, it is recommended to consider Statin cholesterol therapy and or referral to Cardiologist. I will try to help explain your results and if we have questions we can contact the Cardiologist.  You will be contacted for scheduling. Usually it is done at any imaging facility through [facility], [facility] or [facility].  The cost is $99 flat fee total and it does not go through insurance, so no authorization is required.   Please schedule a Follow-up Appointment to: Return if symptoms worsen or fail to improve.  If you have any other questions or concerns, please feel free to call the office or send a message through MyChart. You  may also schedule an earlier appointment if necessary.  Additionally, you may be receiving a survey about your experience at our office within a few days to 1 week by e-mail or mail. We value your feedback.  Marsa Officer, DO [facility], [place]

## 2024-10-23 NOTE — Telephone Encounter (Signed)
 Rx 10/23/24 #90 1RF- duplicate request Requested Prescriptions  Pending Prescriptions Disp Refills   levothyroxine  (SYNTHROID ) 50 MCG tablet [Pharmacy Med Name: LEVOTHYROXINE  0.05MG  ( ) TAB] 90 tablet 1    Sig: TAKE 1 TABLET(50 MCG) BY MOUTH DAILY BEFORE BREAKFAST     Endocrinology:  Hypothyroid Agents Passed - 10/23/2024  1:35 PM      Passed - TSH in normal range and within 360 days    TSH  Date Value Ref Range Status  07/22/2024 3.340 0.450 - 4.500 uIU/mL Final         Passed - Valid encounter within last 12 months    Recent Outpatient Visits           Today Essential (primary) hypertension   [facility] [place] Marsa PARAS, DO   2 months ago Mixed hyperlipidemia   [facility] [place] PARAS, DO   7 months ago Chronic maxillary sinusitis   [facility] [place] PARAS, DO   7 months ago Non-recurrent acute suppurative otitis media of right ear without spontaneous rupture of tympanic membrane   [place] [facility] [place], Marsa PARAS, [place]

## 2024-10-23 NOTE — Progress Notes (Signed)
 Subjective:    Patient ID: Amanda Webb, female    DOB: 1971/06/30, 53 y.o.   MRN: [id number]  Amanda Webb is a 53 y.o. female presenting on 10/23/2024 for Hypertension   HPI  Discussed the use of AI scribe software for clinical note transcription with the patient, who gave verbal consent to proceed.  History of Present Illness   Amanda Webb is a 53 year old female with hypertension who presents with chest pain and elevated blood pressure.  ED FOLLOW-UP VISIT  Hospital/Location: [facility] Date of ED Visit: 10/22/24  Reason for Presenting to ED: Chest Pain / Headache / Blood pressure  FOLLOW-UP  - ED provider note and record have been reviewed - Patient presents today about 1 days after recent ED visit.   Chest pain and associated symptoms - Chest pain began yesterday, initially attributed to gas, and has persisted for a couple of days - Pain radiates to the shoulder, jaw, and chest - Pain in the shoulder, back, and shoulder blade two nights ago - Concern about possible cardiac etiology due to persistence and radiation of pain  Hypertension and blood pressure monitoring - History of elevated blood pressure with recent readings of 183/95 and 175/95 - Pressure sensation in the head when bending down, prompting blood pressure checks - Currently taking atenolol  50 mg daily for blood pressure since at least 2016  Recent emergency department evaluation - Presented to the emergency room for chest pain and elevated blood pressure - Underwent chest x-ray, blood tests, and EKG - Troponin levels were normal  Electrocardiogram findings - EKG prior to knee surgery in July showed prolonged QT interval, result very mild, no concerns at that time.  Psychosocial stressors - Significant stress due to caregiving responsibilities for her mother, another lady, and her grandson - Increased anxiety with her daughter staying with her for the past four days  - New medications on  discharge: none - Changes to current meds on discharge: none  I have reviewed the discharge medication list, and have reconciled the current and discharge medications today.       10/23/2024    7:40 PM 09/04/2024    9:57 AM 03/27/2024   10:21 AM  Depression screen PHQ 2/9  Decreased Interest 1 0 1  Down, Depressed, Hopeless 1 0 1  PHQ - 2 Score 2 0 2  Altered sleeping 2  2  Tired, decreased energy 2  2  Change in appetite 0  0  Feeling bad or failure about yourself  1  1  Trouble concentrating 1  1  Moving slowly or fidgety/restless 0  0  Suicidal thoughts 0  0  PHQ-9 Score 8  8   Difficult doing work/chores Somewhat difficult  Not difficult at all     Data saved with a previous flowsheet row definition       03/27/2024   10:21 AM 10/22/2023    8:22 AM 09/03/2023   10:54 AM 11/19/2022   10:50 AM  GAD 7 : Generalized Anxiety Score  Nervous, Anxious, on Edge 1 2 2 3   Control/stop worrying 1 2 2 3   Worry too much - different things 1 2 2 3   Trouble relaxing 1 2 2 3   Restless 1 0 1 3  Easily annoyed or irritable 1 3 3 3   Afraid - awful might happen 1 3 2 3   Total GAD 7 Score 7 14 14 21   Anxiety Difficulty Not difficult at all  Somewhat difficult    Social History   Tobacco Use   Smoking status: Every Day    Current packs/day: 1.00    Average packs/day: 1 pack/day for 32.0 years (32.0 ttl pk-yrs)    Types: Cigarettes    Passive exposure: Current   Smokeless tobacco: Never   Tobacco comments:    1 ppd/ 11/22/2020  Vaping Use   Vaping status: Never Used  Substance Use Topics   Alcohol use: Yes    Alcohol/week: 3.0 standard drinks of alcohol    Types: 3 Glasses of wine per week    Comment: per week   Drug use: No    Review of Systems Per HPI unless specifically indicated above     Objective:    BP (!) 158/92 (BP Location: Left Arm, Cuff Size: Normal)   Pulse 83   Ht 5' 7 (1.702 m)   Wt 241 lb (109.3 kg)   SpO2 95%   BMI 37.75 kg/m   Wt Readings from  Last 3 Encounters:  10/23/24 241 lb (109.3 kg)  10/22/24 246 lb 14.6 oz (112 kg)  09/04/24 247 lb (112 kg)    Physical Exam Vitals and nursing note reviewed.  Constitutional:      General: She is not in acute distress.    Appearance: She is well-developed. She is not diaphoretic.     Comments: Well-appearing, comfortable, cooperative  HENT:     Head: Normocephalic and atraumatic.  Eyes:     General:        Right eye: No discharge.        Left eye: No discharge.     Conjunctiva/sclera: Conjunctivae normal.  Neck:     Thyroid : No thyromegaly.  Cardiovascular:     Rate and Rhythm: Normal rate and regular rhythm.     Heart sounds: Normal heart sounds. No murmur heard. Pulmonary:     Effort: Pulmonary effort is normal. No respiratory distress.     Breath sounds: Normal breath sounds. No wheezing or rales.  Musculoskeletal:        General: Normal range of motion.     Cervical back: Normal range of motion and neck supple.  Lymphadenopathy:     Cervical: No cervical adenopathy.  Skin:    General: Skin is warm and dry.     Findings: No erythema or rash.  Neurological:     Mental Status: She is alert and oriented to person, place, and time.  Psychiatric:        Behavior: Behavior normal.     Comments: Well groomed, good eye contact, normal speech and thoughts     I have personally reviewed the radiology report from 10/22/24 on CXR.  EXAM: 2 VIEW(S) XRAY OF THE CHEST 10/22/2024 09:20:52 AM   COMPARISON: 03/20/2022   CLINICAL HISTORY: chest pain   FINDINGS:   LUNGS AND PLEURA: No focal pulmonary opacity. No pulmonary edema. No pleural effusion. No pneumothorax.   HEART AND MEDIASTINUM: No acute abnormality of the cardiac and mediastinal silhouettes.   BONES AND SOFT TISSUES: No acute osseous abnormality.   IMPRESSION: 1. No acute cardiopulmonary disease.   Electronically signed by: Lynwood Seip MD 10/22/2024 10:26 AM EST RP Workstation: [id number]  Results  for orders placed or performed during the hospital encounter of 10/22/24  Basic metabolic panel   Collection Time: 10/22/24  9:01 AM  Result Value Ref Range   Sodium 139 135 - 145 mmol/L   Potassium 3.9 3.5 - 5.1 mmol/L   Chloride  103 98 - 111 mmol/L   CO2 23 22 - 32 mmol/L   Glucose, Bld 121 (H) 70 - 99 mg/dL   BUN 8 6 - 20 mg/dL   Creatinine, Ser 9.36 0.44 - 1.00 mg/dL   Calcium  8.8 (L) 8.9 - 10.3 mg/dL   GFR, Estimated >39 >39 mL/min   Anion gap 13 5 - 15  CBC   Collection Time: 10/22/24  9:01 AM  Result Value Ref Range   WBC 9.3 4.0 - 10.5 K/uL   RBC 4.67 3.87 - 5.11 MIL/uL   Hemoglobin 15.0 12.0 - 15.0 g/dL   HCT 54.7 63.9 - 53.9 %   MCV 96.8 80.0 - 100.0 fL   MCH 32.1 26.0 - 34.0 pg   MCHC 33.2 30.0 - 36.0 g/dL   RDW 87.0 88.4 - 84.4 %   Platelets 249 150 - 400 K/uL   nRBC 0.0 0.0 - 0.2 %  Troponin I (High Sensitivity)   Collection Time: 10/22/24  9:01 AM  Result Value Ref Range   Troponin I (High Sensitivity) 4 <18 ng/L   *Note: Due to a large number of results and/or encounters for the requested time period, some results have not been displayed. A complete set of results can be found in Results Review.      Assessment & Plan:   Problem List Items Addressed This Visit   None Visit Diagnoses       Essential (primary) hypertension    -  Primary   Relevant Medications   metoprolol succinate (TOPROL-XL) 50 MG 24 hr tablet   valsartan (DIOVAN) 160 MG tablet        Hypertension with premature cardiac beats and prolonged QT interval Hypertension with recent elevated readings. Premature cardiac beats and mild prolonged QT interval noted on EKG.  Long term Atenolol  insufficient for BP control. Discussed coronary artery calcium  score CT scan, recommendation.  Considered metoprolol for better control. Discussed side effects of amlodipine and valsartan. - Discontinued atenolol . - Started metoprolol 50 mg daily. - Started valsartan 160 mg daily. New ARB start -  Ordered blood work for November 25th to monitor blood pressure and kidney function. - Discussed coronary artery calcium  score CT scan for further cardiac evaluation. She declines today, reconsider. Note prior CT imaging has not shown significant coronary calcification but those images were not focused on cardiac however those reports are reassuring.     No orders of the defined types were placed in this encounter.   Meds ordered this encounter  Medications   metoprolol succinate (TOPROL-XL) 50 MG 24 hr tablet    Sig: Take 1 tablet (50 mg total) by mouth daily. Take with or immediately following a meal.    Dispense:  90 tablet    Refill:  3   valsartan (DIOVAN) 160 MG tablet    Sig: Take 1 tablet (160 mg total) by mouth daily.    Dispense:  90 tablet    Refill:  3    Follow up plan: Return if symptoms worsen or fail to improve.   Marsa Officer, DO [facility] 10/23/2024, 10:42 AM

## 2024-10-25 ENCOUNTER — Other Ambulatory Visit: Payer: Self-pay

## 2024-10-25 ENCOUNTER — Emergency Department

## 2024-10-25 ENCOUNTER — Emergency Department
Admission: EM | Admit: 2024-10-25 | Discharge: 2024-10-25 | Disposition: A | Discharge status: Home / Self Care | Attending: Emergency Medicine | Admitting: Emergency Medicine

## 2024-10-25 ENCOUNTER — Encounter: Payer: Self-pay | Admitting: Family Medicine

## 2024-10-25 DIAGNOSIS — F419 Anxiety disorder, unspecified: Secondary | ICD-10-CM | POA: Diagnosis not present

## 2024-10-25 DIAGNOSIS — E039 Hypothyroidism, unspecified: Secondary | ICD-10-CM | POA: Insufficient documentation

## 2024-10-25 DIAGNOSIS — I10 Essential (primary) hypertension: Secondary | ICD-10-CM | POA: Insufficient documentation

## 2024-10-25 DIAGNOSIS — J45909 Unspecified asthma, uncomplicated: Secondary | ICD-10-CM | POA: Diagnosis not present

## 2024-10-25 DIAGNOSIS — E119 Type 2 diabetes mellitus without complications: Secondary | ICD-10-CM | POA: Diagnosis not present

## 2024-10-25 DIAGNOSIS — R079 Chest pain, unspecified: Secondary | ICD-10-CM | POA: Diagnosis not present

## 2024-10-25 DIAGNOSIS — R0789 Other chest pain: Secondary | ICD-10-CM

## 2024-10-25 DIAGNOSIS — Z79899 Other long term (current) drug therapy: Secondary | ICD-10-CM | POA: Insufficient documentation

## 2024-10-25 DIAGNOSIS — J42 Unspecified chronic bronchitis: Secondary | ICD-10-CM | POA: Diagnosis not present

## 2024-10-25 LAB — CBC
HCT: 41.7 % (ref 36.0–46.0)
Hemoglobin: 14.3 g/dL (ref 12.0–15.0)
MCH: 32.9 pg (ref 26.0–34.0)
MCHC: 34.3 g/dL (ref 30.0–36.0)
MCV: 95.9 fL (ref 80.0–100.0)
Platelets: 241 K/uL (ref 150–400)
RBC: 4.35 MIL/uL (ref 3.87–5.11)
RDW: 12.8 % (ref 11.5–15.5)
WBC: 11.4 K/uL — ABNORMAL HIGH (ref 4.0–10.5)
nRBC: 0 % (ref 0.0–0.2)

## 2024-10-25 LAB — BASIC METABOLIC PANEL WITH GFR
Anion gap: 12 (ref 5–15)
BUN: 11 mg/dL (ref 6–20)
CO2: 24 mmol/L (ref 22–32)
Calcium: 9 mg/dL (ref 8.9–10.3)
Chloride: 104 mmol/L (ref 98–111)
Creatinine, Ser: 0.75 mg/dL (ref 0.44–1.00)
GFR, Estimated: >60 mL/min (ref >60)
Glucose, Bld: 119 mg/dL — ABNORMAL HIGH (ref 70–99)
Potassium: 3.3 mmol/L — ABNORMAL LOW (ref 3.5–5.1)
Sodium: 140 mmol/L (ref 135–145)

## 2024-10-25 LAB — TROPONIN I (HIGH SENSITIVITY): Troponin I (High Sensitivity): 4 ng/L (ref <18)

## 2024-10-25 NOTE — ED Provider Notes (Signed)
 [facility] Provider Note    Event Date/Time   First MD Initiated Contact with Patient 10/25/24 2147     (approximate)   History   Chief Complaint: Chest Pain   HPI  Amanda Webb is a 53 y.o. female with a history of hypertension, diabetes who comes ED complaining of anxiety and chest tightness for last 2 days.  She was seen in the ED 3 days ago, had evaluation which was reassuring.  Notably she is having severe family stressors with the death of 2 close family members recently, she is anticipating increased family responsibilities this holiday season and is planning to host a large gathering for Thanksgiving and is feeling very anxious.  To me she denies any exertional symptoms, no sharp chest pain, no pleuritic symptoms.  Saw her PCP yesterday who started her on valsartan for her hypertension as well as metoprolol.  She has not yet started the metoprolol but did start the valsartan and noticed her blood pressure is improving.      Past Medical History:  Diagnosis Date   Allergy    Anemia    Anxiety    Arrhythmia    Arthritis    Asthma    Cervical dysplasia    Cystitis    Depression    Dyspnea    GERD (gastroesophageal reflux disease)    Gross hematuria    Headache    Heart murmur    asymptomatic   History of methicillin resistant staphylococcus aureus (MRSA) 2013   HLD (hyperlipidemia)    Hypertension    Hypothyroid    Lymphedema    Palpitations    Pernicious anemia    Pneumonia    Tobacco abuse    Type 2 diabetes mellitus [facility])     Current Outpatient Rx   Order #: [id number] Class: Normal   Order #: [id number] Class: Normal   Order #: [id number] Class: Normal   Order #: [id number] Class: Historical Med   Order #: [id number] Class: Normal   Order #: [id number] Class: Historical Med   Order #: [id number] Class: Historical Med   Order #: [id number] Class: Normal   Order #: [id number] Class: Normal   Order #: [id number] Class: Historical Med    Order #: [id number] Class: Historical Med   Order #: [id number] Class: Normal   Order #: [id number] Class: Normal   Order #: [id number] Class: Historical Med   Order #: [id number] Class: Normal   Order #: [id number] Class: Normal   Order #: [id number] Class: Normal   Order #: [id number] Class: Normal   Order #: [id number] Class: Historical Med   Order #: [id number] Class: Normal   Order #: [id number] Class: Normal   Order #: [id number] Class: Normal   Order #: [id number] Class: Normal   Order #: [id number] Class: Normal    Past Surgical History:  Procedure Laterality Date   CARPAL TUNNEL RELEASE Bilateral 2011   FOOT SURGERY Right 2013   Plantar fascia   KNEE ARTHROSCOPY WITH MEDIAL MENISECTOMY Right 07/11/2021   Procedure: Right knee arthroscopic partial medial meniscectomy;  Surgeon: Tobie Priest, MD;  Location: [facility];  Service: Orthopedics;  Laterality: Right;   KNEE ARTHROSCOPY WITH MEDIAL MENISECTOMY Right 07/03/2024   Procedure: ARTHROSCOPY, KNEE, WITH MEDIAL MENISCECTOMY;  Surgeon: Tobie Priest, MD;  Location: [facility];  Service: Orthopedics;  Laterality: Right;  Right knee arthroscopy, partial medial meniscectomy, and subchondroplasty of the medial tibial plateau   KNEE ARTHROSCOPY WITH SUBCHONDROPLASTY Right 07/03/2024   Procedure: ARTHROSCOPY, KNEE, WITH SUBCHONDROPLASTY;  Surgeon: Tobie Priest, MD;  Location: [facility];  Service: Orthopedics;  Laterality: Right;  Right knee arthroscopy, partial medial meniscectomy, and subchondroplasty of the medial tibial plateau   ROBOTIC ASSISTED LAPAROSCOPIC CHOLECYSTECTOMY  07/15/2020   Dr Lane   TONSILLECTOMY  1979   with ear tubes    Physical Exam   Triage Vital Signs: ED Triage Vitals  Encounter Vitals Group     BP 10/25/24 1839 (!) 177/93     Girls Systolic BP Percentile --      Girls Diastolic BP Percentile --      Boys Systolic BP Percentile --      Boys Diastolic BP Percentile --      Pulse Rate 10/25/24 1839 (!) 111     Resp  10/25/24 1839 18     Temp 10/25/24 1839 98.7 F (37.1 C)     Temp src --      SpO2 10/25/24 1839 100 %     Weight 10/25/24 1837 241 lb (109.3 kg)     Height 10/25/24 1837 5' 7 (1.702 m)     Head Circumference --      Peak Flow --      Pain Score 10/25/24 1837 5     Pain Loc --      Pain Education --      Exclude from Growth Chart --     Most recent vital signs: Vitals:   10/25/24 2134 10/25/24 2200  BP: (!) 175/83 (!) 154/80  Pulse: (!) 112 86  Resp: 16 (!) 22  Temp:    SpO2: 100% 96%    General: Awake, no distress.  CV:  Good peripheral perfusion.  Regular rate rhythm, heart rate 80 Resp:  Normal effort.  Clear lungs Abd:  No distention.  Other:  No lower extremity edema, no calf tenderness.  Symmetric calf circumference   ED Results / Procedures / Treatments   Labs (all labs ordered are listed, but only abnormal results are displayed) Labs Reviewed  BASIC METABOLIC PANEL WITH GFR - Abnormal; Notable for the following components:      Result Value   Potassium 3.3 (*)    Glucose, Bld 119 (*)    All other components within normal limits  CBC - Abnormal; Notable for the following components:   WBC 11.4 (*)    All other components within normal limits  TROPONIN I (HIGH SENSITIVITY)  TROPONIN I (HIGH SENSITIVITY)     EKG Interpreted by me Sinus tachycardia rate 107.  Normal axis intervals ST segments. LVH with repol abnormality.  No evidence of right heart strain.   RADIOLOGY Chest x-ray interpreted by me, unremarkable.  Radiology report reviewed    PROCEDURES:  Procedures   MEDICATIONS ORDERED IN ED: Medications - No data to display   IMPRESSION / MDM / ASSESSMENT AND PLAN / ED COURSE  I reviewed the triage vital signs and the nursing notes.  DDx: Anxiety, NSTEMI, electrolyte derangement, pneumonia.  Doubt PE, dissection, pericardial effusion  Patient's presentation is most consistent with acute presentation with potential threat to life or  bodily function.  Patient returns to the ED with persistent anxiety symptoms.  Doubt viral illness.  With her ongoing symptoms and tachycardia, I did give recommend CT to rule out PE, however she is reluctant to pursue that this evening, and on further discussion does feel confident that her symptoms are related to stress and anxiety.  She will contact her psychiatrist tomorrow, continue following up with her primary care.  Presentation is not consistent with ACS.  EKG shows LVH with repolarization abnormality, but  not consistent with acute ischemia.  Return precautions discussed.  Stable for discharge.       FINAL CLINICAL IMPRESSION(S) / ED DIAGNOSES   Final diagnoses:  Anxiety  Atypical chest pain     Rx / DC Orders   ED Discharge Orders     None        Note:  This document was prepared using Dragon voice recognition software and may include unintentional dictation errors.   Viviann Pastor, MD 10/25/24 [phone]

## 2024-10-25 NOTE — ED Notes (Signed)
 Went to check on patient. Pt took equipment off. Pt states they are going to discharge me. Explained to patient that I do not see a discharge order in the chart yet so we will wait till the doctor puts one in.

## 2024-10-25 NOTE — ED Triage Notes (Signed)
 Pt comes with continued cp and concerned about her EKG reading in her mychart. Pt states it says she had an heart attack. Pt stats sob when she is walking.

## 2024-10-27 ENCOUNTER — Ambulatory Visit: Admitting: Family Medicine

## 2024-10-30 ENCOUNTER — Other Ambulatory Visit

## 2024-10-30 DIAGNOSIS — F4310 Post-traumatic stress disorder, unspecified: Secondary | ICD-10-CM | POA: Diagnosis not present

## 2024-11-02 ENCOUNTER — Telehealth: Admitting: Physician Assistant

## 2024-11-02 DIAGNOSIS — B9689 Other specified bacterial agents as the cause of diseases classified elsewhere: Secondary | ICD-10-CM

## 2024-11-02 DIAGNOSIS — J019 Acute sinusitis, unspecified: Secondary | ICD-10-CM | POA: Diagnosis not present

## 2024-11-02 MED ORDER — AMOXICILLIN-POT CLAVULANATE 875-125 MG PO TABS: 1.0000 | ORAL_TABLET | Freq: Two times a day (BID) | ORAL | 0 refills | Status: DC

## 2024-11-02 MED ORDER — PREDNISONE 20 MG PO TABS: 40.0000 mg | ORAL_TABLET | Freq: Every day | ORAL | 0 refills | Status: DC

## 2024-11-02 NOTE — Patient Instructions (Signed)
 Amanda Webb, thank you for joining Delon CHRISTELLA Dickinson, PA-C for today's virtual visit.  While this provider is not your primary care provider (PCP), if your PCP is located in our provider database this encounter information will be shared with them immediately following your visit.   A [facility] MyChart account gives you access to today's visit and all your visits, tests, and labs performed at [facility]  click here if you don't have a [facility] MyChart account or go to [email or web address]  Consent: (Patient) Amanda Webb provided verbal consent for this virtual visit at the beginning of the encounter.  Current Medications:  Current Outpatient Medications:    amoxicillin -clavulanate (AUGMENTIN ) 875-125 MG tablet, Take 1 tablet by mouth 2 (two) times daily., Disp: 20 tablet, Rfl: 0   predniSONE  (DELTASONE ) 20 MG tablet, Take 2 tablets (40 mg total) by mouth daily with breakfast., Disp: 10 tablet, Rfl: 0   Accu-Chek FastClix Lancets MISC, USE TO CHECK BLOOD SUGAR UP TO TWICE DAILY AS DIRECTED, Disp: 102 each, Rfl: 0   albuterol  (VENTOLIN  HFA) 108 (90 Base) MCG/ACT inhaler, INHALE 1 TO 2 PUFFS BY MOUTH EVERY 4 TO 6 HOURS AS NEEDED FOR SHORTNESS OF BREATH, Disp: 18 g, Rfl: 2   albuterol  (VENTOLIN  HFA) 108 (90 Base) MCG/ACT inhaler, Inhale 2 puffs into the lungs every 6 (six) hours as needed for wheezing or shortness of breath., Disp: 8 g, Rfl: 0   ARIPiprazole (ABILIFY) 2 MG tablet, Take 2 mg by mouth every morning. , Disp: , Rfl:    azelastine  (ASTELIN ) 0.1 % nasal spray, Place 1 spray into both nostrils 2 (two) times daily. Use in each nostril as directed, Disp: 30 mL, Rfl: 5   cetirizine (ZYRTEC) 10 MG tablet, Take 10 mg by mouth daily with lunch., Disp: , Rfl:    Cyanocobalamin  (B-12 COMPLIANCE INJECTION IJ), Inject 1 Dose as directed every 30 (thirty) days., Disp: , Rfl:    famotidine (PEPCID) 20 MG tablet, Take 1 tablet (20 mg total) by mouth 2 (two) times daily.,  Disp: 60 tablet, Rfl: 0   fluticasone  (FLONASE ) 50 MCG/ACT nasal spray, Place 2 sprays into both nostrils daily. SHAKE LIQUID AND USE 2 SPRAYS IN EACH NOSTRIL EVERY DAY, Disp: 9.9 mL, Rfl: 6   fluvoxaMINE (LUVOX) 100 MG tablet, Take 150 mg by mouth every morning. , Disp: , Rfl:    gabapentin  (NEURONTIN ) 100 MG capsule, Take 200 mg by mouth daily., Disp: , Rfl:    glucose blood (ACCU-CHEK GUIDE) test strip, CHECK BLOOD SUGAR UP TO EVERY DAY AS DIRECTED, Disp: 200 strip, Rfl: 5   hydrOXYzine  (VISTARIL ) 25 MG capsule, TAKE 1 CAPSULE(25 MG) BY MOUTH EVERY 8 HOURS AS NEEDED, Disp: 30 capsule, Rfl: 0   lamoTRIgine (LAMICTAL) 200 MG tablet, Take 200 mg by mouth daily., Disp: , Rfl:    levothyroxine  (SYNTHROID ) 50 MCG tablet, Take 1 tablet (50 mcg total) by mouth daily before breakfast., Disp: 90 tablet, Rfl: 1   metoprolol succinate (TOPROL-XL) 50 MG 24 hr tablet, Take 1 tablet (50 mg total) by mouth daily. Take with or immediately following a meal., Disp: 90 tablet, Rfl: 3   montelukast  (SINGULAIR ) 10 MG tablet, Take 1 tablet (10 mg total) by mouth at bedtime., Disp: 90 tablet, Rfl: 1   MOUNJARO  5 MG/0.5ML Pen, Inject 5 mg into the skin once a week., Disp: 2 mL, Rfl: 2   Multiple Vitamin (MULTIVITAMIN WITH MINERALS) TABS tablet, Take 1 tablet by mouth daily., Disp: ,  Rfl:    pantoprazole  (PROTONIX ) 20 MG tablet, TAKE 1 TABLET(20 MG) BY MOUTH DAILY, Disp: 90 tablet, Rfl: 1   rosuvastatin  (CRESTOR ) 5 MG tablet, Take 1 tablet (5 mg total) by mouth at bedtime., Disp: 90 tablet, Rfl: 3   TRELEGY ELLIPTA  100-62.5-25 MCG/ACT AEPB, INHALE 1 PUFF INTO THE LUNGS DAILY, Disp: 180 each, Rfl: 1   tretinoin  (RETIN-A ) 0.025 % cream, APPLY EXTERNALLY TO THE AFFECTED AREA AT BEDTIME AS NEEDED, Disp: 45 g, Rfl: 2   valsartan (DIOVAN) 160 MG tablet, Take 1 tablet (160 mg total) by mouth daily., Disp: 90 tablet, Rfl: 3 No current facility-administered medications for this visit.  Facility-Administered Medications Ordered  in Other Visits:    cyanocobalamin  (VITAMIN B12) injection 1,000 mcg, 1,000 mcg, Intramuscular, Once, Melanee Annah BROCKS, MD   cyanocobalamin  (VITAMIN B12) injection 1,000 mcg, 1,000 mcg, Intramuscular, Q30 days, Melanee Annah BROCKS, MD, 1,000 mcg at 02/05/24 0909   Medications ordered in this encounter:  Meds ordered this encounter  Medications   amoxicillin -clavulanate (AUGMENTIN ) 875-125 MG tablet    Sig: Take 1 tablet by mouth 2 (two) times daily.    Dispense:  20 tablet    Refill:  0    Supervising Provider:   LAMPTEY, PHILIP O [[id number]]   predniSONE  (DELTASONE ) 20 MG tablet    Sig: Take 2 tablets (40 mg total) by mouth daily with breakfast.    Dispense:  10 tablet    Refill:  0    Supervising Provider:   BLAISE ALEENE KIDD [[id number]]     *If you need refills on other medications prior to your next appointment, please contact your pharmacy*  Follow-Up: Call back or seek an in-person evaluation if the symptoms worsen or if the condition fails to improve as anticipated.  [facility] [phone]  Other Instructions Sinus Infection, Adult A sinus infection, also called sinusitis, is inflammation of your sinuses. Sinuses are hollow spaces in the bones around your face. Your sinuses are located: Around your eyes. In the middle of your forehead. Behind your nose. In your cheekbones. Mucus normally drains out of your sinuses. When your nasal tissues become inflamed or swollen, mucus can become trapped or blocked. This allows bacteria, viruses, and fungi to grow, which leads to infection. Most infections of the sinuses are caused by a virus. A sinus infection can develop quickly. It can last for up to 4 weeks (acute) or for more than 12 weeks (chronic). A sinus infection often develops after a cold. What are the causes? This condition is caused by anything that creates swelling in the sinuses or stops mucus from draining. This includes: Allergies. Asthma. Infection from  bacteria or viruses. Deformities or blockages in your nose or sinuses. Abnormal growths in the nose (nasal polyps). Pollutants, such as chemicals or irritants in the air. Infection from fungi. This is rare. What increases the risk? You are more likely to develop this condition if you: Have a weak body defense system (immune system). Do a lot of swimming or diving. Overuse nasal sprays. Smoke. What are the signs or symptoms? The main symptoms of this condition are pain and a feeling of pressure around the affected sinuses. Other symptoms include: Stuffy nose or congestion that makes it difficult to breathe through your nose. Thick yellow or greenish drainage from your nose. Tenderness, swelling, and warmth over the affected sinuses. A cough that may get worse at night. Decreased sense of smell and taste. Extra mucus that collects in the  throat or the back of the nose (postnasal drip) causing a sore throat or bad breath. Tiredness (fatigue). Fever. How is this diagnosed? This condition is diagnosed based on: Your symptoms. Your medical history. A physical exam. Tests to find out if your condition is acute or chronic. This may include: Checking your nose for nasal polyps. Viewing your sinuses using a device that has a light (endoscope). Testing for allergies or bacteria. Imaging tests, such as an MRI or CT scan. In rare cases, a bone biopsy may be done to rule out more serious types of fungal sinus disease. How is this treated? Treatment for a sinus infection depends on the cause and whether your condition is chronic or acute. If caused by a virus, your symptoms should go away on their own within 10 days. You may be given medicines to relieve symptoms. They include: Medicines that shrink swollen nasal passages (decongestants). A spray that eases inflammation of the nostrils (topical intranasal corticosteroids). Rinses that help get rid of thick mucus in your nose (nasal saline  washes). Medicines that treat allergies (antihistamines). Over-the-counter pain relievers. If caused by bacteria, your health care provider may recommend waiting to see if your symptoms improve. Most bacterial infections will get better without antibiotic medicine. You may be given antibiotics if you have: A severe infection. A weak immune system. If caused by narrow nasal passages or nasal polyps, surgery may be needed. Follow these instructions at home: Medicines Take, use, or apply over-the-counter and prescription medicines only as told by your health care provider. These may include nasal sprays. If you were prescribed an antibiotic medicine, take it as told by your health care provider. Do not stop taking the antibiotic even if you start to feel better. Hydrate and humidify  Drink enough fluid to keep your urine pale yellow. Staying hydrated will help to thin your mucus. Use a cool mist humidifier to keep the humidity level in your home above 50%. Inhale steam for 10-15 minutes, 3-4 times a day, or as told by your health care provider. You can do this in the bathroom while a hot shower is running. Limit your exposure to cool or dry air. Rest Rest as much as possible. Sleep with your head raised (elevated). Make sure you get enough sleep each night. General instructions  Apply a warm, moist washcloth to your face 3-4 times a day or as told by your health care provider. This will help with discomfort. Use nasal saline washes as often as told by your health care provider. Wash your hands often with soap and water to reduce your exposure to germs. If soap and water are not available, use hand sanitizer. Do not smoke. Avoid being around people who are smoking (secondhand smoke). Keep all follow-up visits. This is important. Contact a health care provider if: You have a fever. Your symptoms get worse. Your symptoms do not improve within 10 days. Get help right away if: You have a  severe headache. You have persistent vomiting. You have severe pain or swelling around your face or eyes. You have vision problems. You develop confusion. Your neck is stiff. You have trouble breathing. These symptoms may be an emergency. Get help right away. Call 911. Do not wait to see if the symptoms will go away. Do not drive yourself to the hospital. Summary A sinus infection is soreness and inflammation of your sinuses. Sinuses are hollow spaces in the bones around your face. This condition is caused by nasal tissues that become  inflamed or swollen. The swelling traps or blocks the flow of mucus. This allows bacteria, viruses, and fungi to grow, which leads to infection. If you were prescribed an antibiotic medicine, take it as told by your health care provider. Do not stop taking the antibiotic even if you start to feel better. Keep all follow-up visits. This is important. This information is not intended to replace advice given to you by your health care provider. Make sure you discuss any questions you have with your health care provider. Document Revised: 11/07/2021 Document Reviewed: 11/07/2021 Elsevier Patient Education  2024 Elsevier Inc.   If you have been instructed to have an in-person evaluation today at a local Urgent Care facility, please use the link below. It will take you to a list of all of our available [facility], including address, phone number and hours of operation. Please do not delay care.  [facility]  If you or a family member do not have a primary care provider, use the link below to schedule a visit and establish care. When you choose a [facility] primary care physician or advanced practice provider, you gain a long-term partner in health. Find a Primary Care Provider  Learn more about [facility] in-office and virtual care options: [facility] - Get Care Now

## 2024-11-02 NOTE — Progress Notes (Signed)
 Virtual Visit Consent   Amanda Webb, you are scheduled for a virtual visit with a [facility] provider today. Just as with appointments in the office, your consent must be obtained to participate. Your consent will be active for this visit and any virtual visit you may have with one of our providers in the next 365 days. If you have a MyChart account, a copy of this consent can be sent to you electronically.  As this is a virtual visit, video technology does not allow for your provider to perform a traditional examination. This may limit your provider's ability to fully assess your condition. If your provider identifies any concerns that need to be evaluated in person or the need to arrange testing (such as labs, EKG, etc.), we will make arrangements to do so. Although advances in technology are sophisticated, we cannot ensure that it will always work on either your end or our end. If the connection with a video visit is poor, the visit may have to be switched to a telephone visit. With either a video or telephone visit, we are not always able to ensure that we have a secure connection.  By engaging in this virtual visit, you consent to the provision of healthcare and authorize for your insurance to be billed (if applicable) for the services provided during this visit. Depending on your insurance coverage, you may receive a charge related to this service.  I need to obtain your verbal consent now. Are you willing to proceed with your visit today? TYTIANA COLES has provided verbal consent on 11/02/2024 for a virtual visit (video or telephone). Delon CHRISTELLA Dickinson, PA-C  Date: 11/02/2024 3:37 PM   Virtual Visit via Video Note   I, Delon CHRISTELLA Dickinson, connected with  EMMER LILLIBRIDGE  ([id number], June 03, 1971) on 11/02/24 at  3:30 PM EST by a video-enabled telemedicine application and verified that I am speaking with the correct person using two identifiers.  Location: Patient: Virtual Visit  Location Patient: Mobile Provider: Virtual Visit Location Provider: Home Office   I discussed the limitations of evaluation and management by telemedicine and the availability of in person appointments. The patient expressed understanding and agreed to proceed.    History of Present Illness: Amanda Webb is a 53 y.o. who identifies as a female who was assigned female at birth, and is being seen today for sinus congestion.  HPI: Sinusitis This is a new problem. The current episode started 1 to 4 weeks ago. The problem has been gradually worsening since onset. There has been no fever. The pain is moderate. Associated symptoms include congestion, ear pain (mild), headaches, sinus pressure and a sore throat (scratchy from drainage). Pertinent negatives include no chills, coughing, hoarse voice or shortness of breath. Treatments tried: zyrtec, azelastine , flonase . The treatment provided no relief.     Problems:  Patient Active Problem List   Diagnosis Date Noted   Major depressive disorder, recurrent, in partial remission 11/08/2020   Cough 10/21/2020   Status post laparoscopic cholecystectomy 07/28/2020   Morbid obesity (HCC) 07/05/2020   Hepatic steatosis 06/23/2020   Lymphedema 03/09/2020   Chronic venous insufficiency 03/09/2020   Bilateral lower extremity edema 02/16/2020   Female cystocele 10/21/2018   Urinary incontinence, mixed 10/21/2018   B12 deficiency 04/08/2018   Iron  deficiency anemia 03/14/2018   GAD (generalized anxiety disorder) 03/10/2018   Acute right-sided low back pain 10/28/2017   Tobacco abuse 05/09/2017   Multiple thyroid  nodules 12/28/2016   Simple chronic  bronchitis (HCC) 12/13/2016   BMI 40.0-44.9, adult (HCC) 09/26/2016   HLD (hyperlipidemia) 09/26/2016   Vitamin D deficiency 09/26/2016   Type 2 diabetes mellitus with other specified complication (HCC) 06/17/2015   Candidiasis of skin and nail 02/21/2015   Candidal intertrigo 02/21/2015   Cellulitis of  right lower extremity 02/18/2015   Benign cyst of right kidney 01/17/2015   Chronic sinusitis 11/15/2014   Whiplash injury 09/03/2014   Heart palpitations 04/02/2014   Palpitations 04/02/2014   Allergic rhinitis 12/31/2013   Spondylosis of lumbar region without myelopathy or radiculopathy 12/28/2013   Chronic low back pain 09/28/2013   Other fatigue 09/28/2013   Hypothyroidism 09/28/2013   Alteration of body temperature 09/28/2013   Other fatigue 09/28/2013   Plantar fasciitis, bilateral 07/10/2013   Tarsal tunnel syndrome of right side 07/10/2013   Plantar fascial fibromatosis 07/10/2013   Encounter for surveillance of injectable contraceptive 05/13/2013   Diuresis excessive 03/24/2013   Polyuria 03/24/2013   Abnormal uterine and vaginal bleeding, unspecified 03/24/2013   Arthritis, multiple joint involvement 03/17/2013   Fibroblastic disorder 07/28/2012   Borderline personality disorder (HCC) 12/17/2010   Borderline personality disorder in adult [facility]) 12/17/2010   Essential hypertension 02/18/2010   Carpal tunnel syndrome on both sides 12/17/2006   Major depressive disorder, single episode 02/21/1998   Major depressive disorder, single episode 02/21/1998   Disk prolapse 04/17/1995   Depression with anxiety 03/18/1995   OCD (obsessive compulsive disorder) 03/18/1995   Severe anxiety with panic 03/18/1995    Allergies:  Allergies  Allergen Reactions   Bee Venom Anaphylaxis, Hives and Swelling    Carries Epi pen.    Cat Dander Itching, Other (See Comments) and Swelling    Allergic to trees, nuts, wheat, grass, cats & dogs - itchy watery eyes, swelling. Uses Zyrtec & Flonase  & Benadryl if really bad. Used to get allergy shots. Allergic to trees, nuts, wheat, grass, cats & dogs - itchy watery eyes, swelling. Uses Zyrtec & Flonase  & Benadryl if really bad. Used to get allergy shots.   Dog Epithelium Cough and Shortness Of Breath   Dog Epithelium (Canis Lupus Familiaris)  Shortness Of Breath   Dog Fennel Cough and Shortness Of Breath   Dog Fennel Allergy Skin Test Shortness Of Breath   Dust Mite Extract Cough and Shortness Of Breath   Tetracyclines & Related Nausea And Vomiting   Lactose Diarrhea and Nausea And Vomiting   Milk (Cow) Diarrhea and Nausea And Vomiting   Milk Protein Diarrhea and Nausea And Vomiting   Milk-Related Compounds Diarrhea and Nausea And Vomiting   Tape Other (See Comments) and Rash    Needs to use paper tape. Breaks out with severe rash, pulls skin off when using adhesive.   Wound Dressing Adhesive Rash    Other reaction(s): Other Needs to use paper tape. Breaks out with severe rash, pulls skin off when using adhesive.  Needs to use paper tape. Breaks out with severe rash, pulls skin off when using adhesive. Needs to use paper tape. Breaks out with severe rash, pulls skin off when using adhesive.   Medications:  Current Outpatient Medications:    amoxicillin -clavulanate (AUGMENTIN ) 875-125 MG tablet, Take 1 tablet by mouth 2 (two) times daily., Disp: 20 tablet, Rfl: 0   predniSONE  (DELTASONE ) 20 MG tablet, Take 2 tablets (40 mg total) by mouth daily with breakfast., Disp: 10 tablet, Rfl: 0   Accu-Chek FastClix Lancets MISC, USE TO CHECK BLOOD SUGAR UP TO TWICE DAILY AS DIRECTED, Disp:  102 each, Rfl: 0   albuterol  (VENTOLIN  HFA) 108 (90 Base) MCG/ACT inhaler, INHALE 1 TO 2 PUFFS BY MOUTH EVERY 4 TO 6 HOURS AS NEEDED FOR SHORTNESS OF BREATH, Disp: 18 g, Rfl: 2   albuterol  (VENTOLIN  HFA) 108 (90 Base) MCG/ACT inhaler, Inhale 2 puffs into the lungs every 6 (six) hours as needed for wheezing or shortness of breath., Disp: 8 g, Rfl: 0   ARIPiprazole (ABILIFY) 2 MG tablet, Take 2 mg by mouth every morning. , Disp: , Rfl:    azelastine  (ASTELIN ) 0.1 % nasal spray, Place 1 spray into both nostrils 2 (two) times daily. Use in each nostril as directed, Disp: 30 mL, Rfl: 5   cetirizine (ZYRTEC) 10 MG tablet, Take 10 mg by mouth daily with lunch.,  Disp: , Rfl:    Cyanocobalamin  (B-12 COMPLIANCE INJECTION IJ), Inject 1 Dose as directed every 30 (thirty) days., Disp: , Rfl:    famotidine (PEPCID) 20 MG tablet, Take 1 tablet (20 mg total) by mouth 2 (two) times daily., Disp: 60 tablet, Rfl: 0   fluticasone  (FLONASE ) 50 MCG/ACT nasal spray, Place 2 sprays into both nostrils daily. SHAKE LIQUID AND USE 2 SPRAYS IN EACH NOSTRIL EVERY DAY, Disp: 9.9 mL, Rfl: 6   fluvoxaMINE (LUVOX) 100 MG tablet, Take 150 mg by mouth every morning. , Disp: , Rfl:    gabapentin  (NEURONTIN ) 100 MG capsule, Take 200 mg by mouth daily., Disp: , Rfl:    glucose blood (ACCU-CHEK GUIDE) test strip, CHECK BLOOD SUGAR UP TO EVERY DAY AS DIRECTED, Disp: 200 strip, Rfl: 5   hydrOXYzine  (VISTARIL ) 25 MG capsule, TAKE 1 CAPSULE(25 MG) BY MOUTH EVERY 8 HOURS AS NEEDED, Disp: 30 capsule, Rfl: 0   lamoTRIgine (LAMICTAL) 200 MG tablet, Take 200 mg by mouth daily., Disp: , Rfl:    levothyroxine  (SYNTHROID ) 50 MCG tablet, Take 1 tablet (50 mcg total) by mouth daily before breakfast., Disp: 90 tablet, Rfl: 1   metoprolol succinate (TOPROL-XL) 50 MG 24 hr tablet, Take 1 tablet (50 mg total) by mouth daily. Take with or immediately following a meal., Disp: 90 tablet, Rfl: 3   montelukast  (SINGULAIR ) 10 MG tablet, Take 1 tablet (10 mg total) by mouth at bedtime., Disp: 90 tablet, Rfl: 1   MOUNJARO  5 MG/0.5ML Pen, Inject 5 mg into the skin once a week., Disp: 2 mL, Rfl: 2   Multiple Vitamin (MULTIVITAMIN WITH MINERALS) TABS tablet, Take 1 tablet by mouth daily., Disp: , Rfl:    pantoprazole  (PROTONIX ) 20 MG tablet, TAKE 1 TABLET(20 MG) BY MOUTH DAILY, Disp: 90 tablet, Rfl: 1   rosuvastatin  (CRESTOR ) 5 MG tablet, Take 1 tablet (5 mg total) by mouth at bedtime., Disp: 90 tablet, Rfl: 3   TRELEGY ELLIPTA  100-62.5-25 MCG/ACT AEPB, INHALE 1 PUFF INTO THE LUNGS DAILY, Disp: 180 each, Rfl: 1   tretinoin  (RETIN-A ) 0.025 % cream, APPLY EXTERNALLY TO THE AFFECTED AREA AT BEDTIME AS NEEDED, Disp: 45  g, Rfl: 2   valsartan (DIOVAN) 160 MG tablet, Take 1 tablet (160 mg total) by mouth daily., Disp: 90 tablet, Rfl: 3 No current facility-administered medications for this visit.  Facility-Administered Medications Ordered in Other Visits:    cyanocobalamin  (VITAMIN B12) injection 1,000 mcg, 1,000 mcg, Intramuscular, Once, Melanee Annah BROCKS, MD   cyanocobalamin  (VITAMIN B12) injection 1,000 mcg, 1,000 mcg, Intramuscular, Q30 days, Melanee Annah BROCKS, MD, 1,000 mcg at 02/05/24 9090  Observations/Objective: Patient is well-developed, well-nourished in no acute distress.  Resting comfortably Head is normocephalic, atraumatic.  No labored breathing.  Speech is clear and coherent with logical content.  Patient is alert and oriented at baseline.    Assessment and Plan: 1. Acute bacterial sinusitis (Primary) - amoxicillin -clavulanate (AUGMENTIN ) 875-125 MG tablet; Take 1 tablet by mouth 2 (two) times daily.  Dispense: 20 tablet; Refill: 0 - predniSONE  (DELTASONE ) 20 MG tablet; Take 2 tablets (40 mg total) by mouth daily with breakfast.  Dispense: 10 tablet; Refill: 0  - Worsening symptoms that have not responded to OTC medications.  - Will give Augmentin  and Prednisone  - Continue allergy medications.  - Steam and humidifier can help - Stay well hydrated and get plenty of rest.  - Seek in person evaluation if no symptom improvement or if symptoms worsen   Follow Up Instructions: I discussed the assessment and treatment plan with the patient. The patient was provided an opportunity to ask questions and all were answered. The patient agreed with the plan and demonstrated an understanding of the instructions.  A copy of instructions were sent to the patient via MyChart unless otherwise noted below.    The patient was advised to call back or seek an in-person evaluation if the symptoms worsen or if the condition fails to improve as anticipated.    Delon CHRISTELLA Dickinson, PA-C

## 2024-11-05 ENCOUNTER — Inpatient Hospital Stay: Attending: Oncology

## 2024-11-05 DIAGNOSIS — E538 Deficiency of other specified B group vitamins: Secondary | ICD-10-CM | POA: Insufficient documentation

## 2024-11-05 DIAGNOSIS — D509 Iron deficiency anemia, unspecified: Secondary | ICD-10-CM

## 2024-11-05 MED ORDER — CYANOCOBALAMIN 1000 MCG/ML IJ SOLN
1000.0000 ug | INTRAMUSCULAR | Status: DC
  Administered 2024-11-05: 1000 ug via INTRAMUSCULAR
  Filled 2024-11-05: qty 1

## 2024-11-10 ENCOUNTER — Telehealth: Payer: Self-pay | Admitting: Family Medicine

## 2024-11-10 ENCOUNTER — Other Ambulatory Visit

## 2024-11-10 DIAGNOSIS — E1169 Type 2 diabetes mellitus with other specified complication: Secondary | ICD-10-CM

## 2024-11-10 DIAGNOSIS — I10 Essential (primary) hypertension: Secondary | ICD-10-CM

## 2024-11-10 DIAGNOSIS — E782 Mixed hyperlipidemia: Secondary | ICD-10-CM | POA: Diagnosis not present

## 2024-11-10 DIAGNOSIS — Z0000 Encounter for general adult medical examination without abnormal findings: Secondary | ICD-10-CM

## 2024-11-10 DIAGNOSIS — E039 Hypothyroidism, unspecified: Secondary | ICD-10-CM

## 2024-11-10 NOTE — Telephone Encounter (Signed)
 Duplicate entry. Already documented.  Marsa Officer, DO [facility] 11/10/2024, 10:08 AM

## 2024-11-10 NOTE — Telephone Encounter (Signed)
 Pt was in the office for labs this morning states that she get a steroid injection every 3 months schedule for one in December. Pt.did a telehealth on yesterday was given prednisone  pt.wanted to know if it was okay to take the prednisone  since she is schedule for a shot in December. Pt would like a call back with [phone]

## 2024-11-10 NOTE — Telephone Encounter (Signed)
 She walked in for labs today and asked questions to Rachell when she checked in.  She wants to know if she is able to take oral Prednisone  prescribed by Urgent Care if she already has received Knee cortisone injection.  Can you call her back and let her know, yes it is okay to take the oral prednisone  short term now? She will run risk of just higher sugar and side effects while on the Prednisone  but it is generally safe to overlap and take it.  Marsa Officer, DO [facility] 11/10/2024, 8:38 AM

## 2024-11-11 LAB — CBC WITH DIFFERENTIAL/PLATELET
Absolute Lymphocytes: 3024 {cells}/uL (ref 850–3900)
Absolute Monocytes: 531 {cells}/uL (ref 200–950)
Basophils Absolute: 36 {cells}/uL (ref 0–200)
Basophils Relative: 0.4 %
Eosinophils Absolute: 243 {cells}/uL (ref 15–500)
Eosinophils Relative: 2.7 %
HCT: 44.9 % (ref 35.9–46.0)
Hemoglobin: 15.4 g/dL (ref 11.7–15.5)
MCH: 33.6 pg — ABNORMAL HIGH (ref 27.0–33.0)
MCHC: 34.3 g/dL (ref 31.6–35.4)
MCV: 97.8 fL (ref 81.4–101.7)
MPV: 10 fL (ref 7.5–12.5)
Monocytes Relative: 5.9 %
Neutro Abs: 5166 {cells}/uL (ref 1500–7800)
Neutrophils Relative %: 57.4 %
Platelets: 250 Thousand/uL (ref 140–400)
RBC: 4.59 Million/uL (ref 3.80–5.10)
RDW: 12.9 % (ref 11.0–15.0)
Total Lymphocyte: 33.6 %
WBC: 9 Thousand/uL (ref 3.8–10.8)

## 2024-11-11 LAB — LIPID PANEL
Cholesterol: 216 mg/dL — ABNORMAL HIGH (ref <200)
HDL: 67 mg/dL (ref >=50)
LDL Cholesterol (Calc): 118 mg/dL — ABNORMAL HIGH
Non-HDL Cholesterol (Calc): 149 mg/dL — ABNORMAL HIGH (ref <130)
Total CHOL/HDL Ratio: 3.2 (calc) (ref <5.0)
Triglycerides: 192 mg/dL — ABNORMAL HIGH (ref <150)

## 2024-11-11 LAB — COMPREHENSIVE METABOLIC PANEL WITH GFR
AG Ratio: 1.7 (calc) (ref 1.0–2.5)
ALT: 13 U/L (ref 6–29)
AST: 16 U/L (ref 10–35)
Albumin: 4.5 g/dL (ref 3.6–5.1)
Alkaline phosphatase (APISO): 86 U/L (ref 37–153)
BUN: 9 mg/dL (ref 7–25)
CO2: 22 mmol/L (ref 20–32)
Calcium: 9.6 mg/dL (ref 8.6–10.4)
Chloride: 103 mmol/L (ref 98–110)
Creat: 0.72 mg/dL (ref 0.50–1.03)
Globulin: 2.7 g/dL (ref 1.9–3.7)
Glucose, Bld: 136 mg/dL — ABNORMAL HIGH (ref 65–99)
Potassium: 3.9 mmol/L (ref 3.5–5.3)
Sodium: 139 mmol/L (ref 135–146)
Total Bilirubin: 0.5 mg/dL (ref 0.2–1.2)
Total Protein: 7.2 g/dL (ref 6.1–8.1)
eGFR: 101 mL/min/1.73m2 (ref >=60)

## 2024-11-11 LAB — MICROALBUMIN / CREATININE URINE RATIO
Creatinine, Urine: 31 mg/dL (ref 20–275)
Microalb Creat Ratio: 74 mg/g{creat} — ABNORMAL HIGH (ref <30)
Microalb, Ur: 2.3 mg/dL

## 2024-11-11 LAB — T4, FREE: Free T4: 1.4 ng/dL (ref 0.8–1.8)

## 2024-11-11 LAB — TSH: TSH: 2.62 m[IU]/L

## 2024-11-11 LAB — HEMOGLOBIN A1C
Hgb A1c MFr Bld: 6.2 % — ABNORMAL HIGH (ref <5.7)
Mean Plasma Glucose: 131 mg/dL
eAG (mmol/L): 7.3 mmol/L

## 2024-11-16 ENCOUNTER — Encounter: Payer: Self-pay | Admitting: Urgent Care

## 2024-11-17 ENCOUNTER — Ambulatory Visit: Admitting: Family Medicine

## 2024-11-17 ENCOUNTER — Encounter: Payer: Self-pay | Admitting: Family Medicine

## 2024-11-17 VITALS — BP 134/80 | HR 91 | Ht 67.0 in | Wt 239.0 lb

## 2024-11-17 DIAGNOSIS — E782 Mixed hyperlipidemia: Secondary | ICD-10-CM | POA: Diagnosis not present

## 2024-11-17 DIAGNOSIS — I517 Cardiomegaly: Secondary | ICD-10-CM | POA: Diagnosis not present

## 2024-11-17 DIAGNOSIS — Z23 Encounter for immunization: Secondary | ICD-10-CM | POA: Diagnosis not present

## 2024-11-17 DIAGNOSIS — I10 Essential (primary) hypertension: Secondary | ICD-10-CM

## 2024-11-17 DIAGNOSIS — E039 Hypothyroidism, unspecified: Secondary | ICD-10-CM

## 2024-11-17 DIAGNOSIS — R9431 Abnormal electrocardiogram [ECG] [EKG]: Secondary | ICD-10-CM

## 2024-11-17 DIAGNOSIS — Z7985 Long-term (current) use of injectable non-insulin antidiabetic drugs: Secondary | ICD-10-CM | POA: Diagnosis not present

## 2024-11-17 DIAGNOSIS — Z8249 Family history of ischemic heart disease and other diseases of the circulatory system: Secondary | ICD-10-CM

## 2024-11-17 DIAGNOSIS — E1169 Type 2 diabetes mellitus with other specified complication: Secondary | ICD-10-CM | POA: Diagnosis not present

## 2024-11-17 MED ORDER — MOUNJARO 7.5 MG/0.5ML ~~LOC~~ SOAJ: 7.5000 mg | SUBCUTANEOUS | 2 refills | Status: AC

## 2024-11-17 NOTE — Patient Instructions (Addendum)
 Thank you for coming to the office today.  Prevnar-20 vaccine pneumonia shot today, good for 5 years.  Future consider Shingles vaccine.  Referral to Dr Florencio [facility], he can discuss imaging and testing for you.  -------------------  Mounjaro  dose increase 5 to 7.5  Call Cologuard company to  request a new kit.  Colon Cancer Screening:  Ordered the Cologuard (home kit) test for colon cancer screening. Stay tuned for further updates.  It will be shipped to you directly. If not received in 2-4 weeks, call us  or the company.   If you send it back and no results are received in 2-4 weeks, call us  or the company as well!   Colon Cancer Screening: - For all adults age 63+ routine colon cancer screening is highly recommended.     - Recent guidelines from American Cancer Society recommend starting age of 74 - Early detection of colon cancer is important, because often there are no warning signs or symptoms, also if found early usually it can be cured. Late stage is hard to treat.   - If Cologuard is NEGATIVE, then it is good for 3 years before next due - If Cologuard is POSITIVE, then it is strongly advised to get a Colonoscopy, which allows the GI doctor to locate the source of the cancer or polyp (even very early stage) and treat it by removing it. ------------------------- Follow instructions to collect sample, you may call the company for any help or questions, 24/7 telephone support at [phone].     Please schedule a Follow-up Appointment to: Return in about 3 months (around 02/15/2025) for 3 month DM A1c, any day, late morning.  If you have any other questions or concerns, please feel free to call the office or send a message through MyChart. You may also schedule an earlier appointment if necessary.  Additionally, you may be receiving a survey about your experience at our office within a few days to 1 week by e-mail or mail. We value your  feedback.  Marsa Officer, DO [facility], [place]

## 2024-11-17 NOTE — Progress Notes (Signed)
 Subjective:    Patient ID: Ronal GORMAN [place], female    DOB: 12-22-1970, 53 y.o.   MRN: [id number]  ELIZABET SCHWEPPE is a 53 y.o. female presenting on 11/17/2024 for Annual Exam   HPI  Discussed the use of AI scribe software for clinical note transcription with the patient, who gave verbal consent to proceed.  History of Present Illness   Dania Marsan is a 53 year old female with hypertension and coronary artery disease who presents for an annual physical exam.  Hypertension - Hypertension previously uncontrolled, now managed with medication, improved. - Recent urine test showed microalbumin / creatinine ratio 74, mild elevated. In moderate range. But normal creatinine and eGFR - Concern regarding possible relationship between microalbuminuria and prior uncontrolled blood pressure - On Metoprolol  XL 50mg  daily, Valsartan  160mg  daily  Coronary artery disease and history of chest pain - Strong family history of Coronary Artery Disease / Heart Disease - She has had evaluation at hospital ED in past for chest pains and work up negative but she did have abnormal EKG recently asking about Cardiology consultation - No evidence of myocardial infarction on evaluation - Has not started prescribed statin due to concerns about side effects  Type 2 Diabetes Diabetes mellitus and weight management - Currently taking Mounjaro  5mg  weekly, and interested in dose increase. - Hemoglobin A1c decreased from 7.1% to 6.2% - Desires further weight loss  Gastrointestinal symptoms - Frequent loose stools - Difficulty completing Cologuard test due to stool consistency - Inquires about requirements for test sample consistency  HYPERLIPIDEMIA: - Reports no concerns. Last lipid panel 11/202 - Last Rosuvastatin  order has not been taken due to her concern with statin side effects.  Recent emergency department evaluation - Presented to the emergency room for chest pain and elevated blood pressure -  Underwent chest x-ray, blood tests, and EKG - Troponin levels were normal Electrocardiogram findings with LVH    Health Maintenance: Future Shingrix vaccine at pharmacy  Pneumonia vaccine due today, Prevnar-20  Tdap 10/19/24     11/17/2024    9:50 AM 10/23/2024    7:40 PM 09/04/2024    9:57 AM  Depression screen PHQ 2/9  Decreased Interest 1 1 0  Down, Depressed, Hopeless 1 1 0  PHQ - 2 Score 2 2 0  Altered sleeping 3 2   Tired, decreased energy 1 2   Change in appetite 0 0   Feeling bad or failure about yourself  1 1   Trouble concentrating 2 1   Moving slowly or fidgety/restless 1 0   Suicidal thoughts 0 0   PHQ-9 Score 10 8   Difficult doing work/chores Somewhat difficult Somewhat difficult        11/17/2024    9:50 AM 03/27/2024   10:21 AM 10/22/2023    8:22 AM 09/03/2023   10:54 AM  GAD 7 : Generalized Anxiety Score  Nervous, Anxious, on Edge 1 1 2 2   Control/stop worrying 2 1 2 2   Worry too much - different things 2 1 2 2   Trouble relaxing 2 1 2 2   Restless 0 1 0 1  Easily annoyed or irritable 2 1 3 3   Afraid - awful might happen 2 1 3 2   Total GAD 7 Score 11 7 14 14   Anxiety Difficulty Somewhat difficult Not difficult at all       Past Medical History:  Diagnosis Date   Allergy    Anemia    Anxiety  Arrhythmia    Arthritis    Asthma    Cervical dysplasia    Cystitis    Depression    Dyspnea    GERD (gastroesophageal reflux disease)    Gross hematuria    Headache    Heart murmur    asymptomatic   History of methicillin resistant staphylococcus aureus (MRSA) 2013   HLD (hyperlipidemia)    Hypertension    Hypothyroid    Lymphedema    Palpitations    Pernicious anemia    Pneumonia    Tobacco abuse    Type 2 diabetes mellitus (HCC)    Past Surgical History:  Procedure Laterality Date   CARPAL TUNNEL RELEASE Bilateral 2011   FOOT SURGERY Right 2013   Plantar fascia   KNEE ARTHROSCOPY WITH MEDIAL MENISECTOMY Right 07/11/2021   Procedure:  Right knee arthroscopic partial medial meniscectomy;  Surgeon: Tobie Priest, MD;  [facility];  Service: Orthopedics;  Laterality: Right;   KNEE ARTHROSCOPY WITH MEDIAL MENISECTOMY Right 07/03/2024   Procedure: ARTHROSCOPY, KNEE, WITH MEDIAL MENISCECTOMY;  Surgeon: Tobie Priest, MD;  Location: [facility];  Service: Orthopedics;  Laterality: Right;  Right knee arthroscopy, partial medial meniscectomy, and subchondroplasty of the medial tibial plateau   KNEE ARTHROSCOPY WITH SUBCHONDROPLASTY Right 07/03/2024   Procedure: ARTHROSCOPY, KNEE, WITH SUBCHONDROPLASTY;  Surgeon: Tobie Priest, MD;  Location: [facility];  Service: Orthopedics;  Laterality: Right;  Right knee arthroscopy, partial medial meniscectomy, and subchondroplasty of the medial tibial plateau   ROBOTIC ASSISTED LAPAROSCOPIC CHOLECYSTECTOMY  07/15/2020   Dr Lane   TONSILLECTOMY  1979   with ear tubes   Social History   Socioeconomic History   Marital status: Divorced    Spouse name: Not on file   Number of children: Not on file   Years of education: Some college   Highest education level: Some college, no degree  Occupational History   Occupation: disability   Tobacco Use   Smoking status: Every Day    Current packs/day: 1.00    Average packs/day: 1 pack/day for 32.0 years (32.0 ttl pk-yrs)    Types: Cigarettes    Passive exposure: Current   Smokeless tobacco: Never   Tobacco comments:    1 ppd/ 11/22/2020  Vaping Use   Vaping status: Never Used  Substance and Sexual Activity   Alcohol use: Yes    Alcohol/week: 3.0 standard drinks of alcohol    Types: 3 Glasses of wine per week    Comment: per week   Drug use: No   Sexual activity: Yes    Birth control/protection: Injection  Other Topics Concern   Not on file  Social History Narrative   Lives with mom   Social Drivers of Health   Financial Resource Strain: Low Risk  (10/07/2024)   Overall Financial Resource Strain (CARDIA)    Difficulty of  Paying Living Expenses: Not hard at all  Food Insecurity: No Food Insecurity (10/07/2024)   Hunger Vital Sign    Worried About Running Out of Food in the Last Year: Never true    Ran Out of Food in the Last Year: Never true  Transportation Needs: No Transportation Needs (10/07/2024)   PRAPARE - Administrator, Civil Service (Medical): No    Lack of Transportation (Non-Medical): No  Physical Activity: Inactive (10/07/2024)   Exercise Vital Sign    Days of Exercise per Week: 0 days    Minutes of Exercise per Session: Not on file  Stress: No Stress  Concern Present (10/07/2024)   [employer] of Occupational Health - Occupational Stress Questionnaire    Feeling of Stress: Only a little  Recent Concern: Stress - Stress Concern Present (07/24/2024)   [employer] of Occupational Health - Occupational Stress Questionnaire    Feeling of Stress: Very much  Social Connections: Socially Integrated (10/07/2024)   Social Connection and Isolation Panel    Frequency of Communication with Friends and Family: More than three times a week    Frequency of Social Gatherings with Friends and Family: More than three times a week    Attends Religious Services: More than 4 times per year    Active Member of [employer] or Organizations: Yes    Attends Engineer, Structural: More than 4 times per year    Marital Status: Married  Recent Concern: Social Connections - Moderately Isolated (07/24/2024)   Social Connection and Isolation Panel    Frequency of Communication with Friends and Family: Once a week    Frequency of Social Gatherings with Friends and Family: Never    Attends Religious Services: More than 4 times per year    Active Member of [employer] or Organizations: No    Attends Engineer, Structural: Not on file    Marital Status: Married  Catering Manager Violence: Not At Risk (07/07/2019)   Humiliation, Afraid, Rape, and Kick questionnaire    Fear of Current or Ex-Partner:  No    Emotionally Abused: No    Physically Abused: No    Sexually Abused: No   Family History  Problem Relation Age of Onset   Depression Mother    Mental illness Mother    Alcohol abuse Mother    Lung cancer Mother    Cancer Maternal Grandmother    Diabetes Maternal Grandmother    Ovarian cancer Maternal Grandmother    Kidney disease Maternal Grandfather    Diabetes Father    Mental illness Father    Depression Father    Drug abuse Father    Depression Paternal Grandfather    Drug abuse Paternal Grandfather    Bladder Cancer Neg Hx    Breast cancer Neg Hx    Current Outpatient Medications on File Prior to Visit  Medication Sig   Accu-Chek FastClix Lancets MISC USE TO CHECK BLOOD SUGAR UP TO TWICE DAILY AS DIRECTED   albuterol  (VENTOLIN  HFA) 108 (90 Base) MCG/ACT inhaler INHALE 1 TO 2 PUFFS BY MOUTH EVERY 4 TO 6 HOURS AS NEEDED FOR SHORTNESS OF BREATH   albuterol  (VENTOLIN  HFA) 108 (90 Base) MCG/ACT inhaler Inhale 2 puffs into the lungs every 6 (six) hours as needed for wheezing or shortness of breath.   ARIPiprazole (ABILIFY) 2 MG tablet Take 2 mg by mouth every morning.    azelastine  (ASTELIN ) 0.1 % nasal spray Place 1 spray into both nostrils 2 (two) times daily. Use in each nostril as directed   cetirizine (ZYRTEC) 10 MG tablet Take 10 mg by mouth daily with lunch.   Cyanocobalamin  (B-12 COMPLIANCE INJECTION IJ) Inject 1 Dose as directed every 30 (thirty) days.   fluticasone  (FLONASE ) 50 MCG/ACT nasal spray Place 2 sprays into both nostrils daily. SHAKE LIQUID AND USE 2 SPRAYS IN EACH NOSTRIL EVERY DAY   fluvoxaMINE (LUVOX) 100 MG tablet Take 150 mg by mouth every morning.    gabapentin  (NEURONTIN ) 100 MG capsule Take 200 mg by mouth daily.   glucose blood (ACCU-CHEK GUIDE) test strip CHECK BLOOD SUGAR UP TO EVERY DAY AS DIRECTED  hydrOXYzine  (VISTARIL ) 25 MG capsule TAKE 1 CAPSULE(25 MG) BY MOUTH EVERY 8 HOURS AS NEEDED   lamoTRIgine (LAMICTAL) 200 MG tablet Take 200 mg by  mouth daily.   levothyroxine  (SYNTHROID ) 50 MCG tablet Take 1 tablet (50 mcg total) by mouth daily before breakfast.   metoprolol  succinate (TOPROL -XL) 50 MG 24 hr tablet Take 1 tablet (50 mg total) by mouth daily. Take with or immediately following a meal.   montelukast  (SINGULAIR ) 10 MG tablet Take 1 tablet (10 mg total) by mouth at bedtime.   Multiple Vitamin (MULTIVITAMIN WITH MINERALS) TABS tablet Take 1 tablet by mouth daily.   pantoprazole  (PROTONIX ) 20 MG tablet TAKE 1 TABLET(20 MG) BY MOUTH DAILY   TRELEGY ELLIPTA  100-62.5-25 MCG/ACT AEPB INHALE 1 PUFF INTO THE LUNGS DAILY   tretinoin  (RETIN-A ) 0.025 % cream APPLY EXTERNALLY TO THE AFFECTED AREA AT BEDTIME AS NEEDED   valsartan  (DIOVAN ) 160 MG tablet Take 1 tablet (160 mg total) by mouth daily.   famotidine  (PEPCID ) 20 MG tablet Take 1 tablet (20 mg total) by mouth 2 (two) times daily. (Patient not taking: Reported on 11/17/2024)   rosuvastatin  (CRESTOR ) 5 MG tablet Take 1 tablet (5 mg total) by mouth at bedtime. (Patient not taking: Reported on 11/17/2024)   Current Facility-Administered Medications on File Prior to Visit  Medication   cyanocobalamin  (VITAMIN B12) injection 1,000 mcg   cyanocobalamin  (VITAMIN B12) injection 1,000 mcg    Review of Systems  Constitutional:  Negative for activity change, appetite change, chills, diaphoresis, fatigue and fever.  HENT:  Negative for congestion and hearing loss.   Eyes:  Negative for visual disturbance.  Respiratory:  Negative for cough, chest tightness, shortness of breath and wheezing.   Cardiovascular:  Negative for chest pain, palpitations and leg swelling.  Gastrointestinal:  Negative for abdominal pain, constipation, diarrhea, nausea and vomiting.  Genitourinary:  Negative for dysuria, frequency and hematuria.  Musculoskeletal:  Negative for arthralgias and neck pain.  Skin:  Negative for rash.  Neurological:  Negative for dizziness, weakness, light-headedness, numbness and  headaches.  Hematological:  Negative for adenopathy.  Psychiatric/Behavioral:  Negative for behavioral problems, dysphoric mood and sleep disturbance.    Per HPI unless specifically indicated above     Objective:    BP 134/80 (BP Location: Left Arm, Patient Position: Sitting, Cuff Size: Large)   Pulse 91   Ht 5' 7 (1.702 m)   Wt 239 lb (108.4 kg)   SpO2 98%   BMI 37.43 kg/m   Wt Readings from Last 3 Encounters:  11/17/24 239 lb (108.4 kg)  10/25/24 241 lb (109.3 kg)  10/23/24 241 lb (109.3 kg)    Physical Exam Vitals and nursing note reviewed.  Constitutional:      General: She is not in acute distress.    Appearance: She is well-developed. She is obese. She is not diaphoretic.     Comments: Well-appearing, comfortable, cooperative  HENT:     Head: Normocephalic and atraumatic.  Eyes:     General:        Right eye: No discharge.        Left eye: No discharge.     Conjunctiva/sclera: Conjunctivae normal.     Pupils: Pupils are equal, round, and reactive to light.  Neck:     Thyroid : No thyromegaly.     Vascular: No carotid bruit.  Cardiovascular:     Rate and Rhythm: Normal rate and regular rhythm.     Pulses: Normal pulses.  Heart sounds: Normal heart sounds. No murmur heard. Pulmonary:     Effort: Pulmonary effort is normal. No respiratory distress.     Breath sounds: Normal breath sounds. No wheezing or rales.  Abdominal:     General: Bowel sounds are normal. There is no distension.     Palpations: Abdomen is soft. There is no mass.     Tenderness: There is no abdominal tenderness.  Musculoskeletal:        General: No tenderness. Normal range of motion.     Cervical back: Normal range of motion and neck supple.     Right lower leg: No edema.     Left lower leg: No edema.     Comments: Upper / Lower Extremities: - Normal muscle tone, strength bilateral upper extremities 5/5, lower extremities 5/5  Lymphadenopathy:     Cervical: No cervical adenopathy.   Skin:    General: Skin is warm and dry.     Findings: No erythema or rash.  Neurological:     Mental Status: She is alert and oriented to person, place, and time.     Comments: Distal sensation intact to light touch all extremities  Psychiatric:        Mood and Affect: Mood normal.        Behavior: Behavior normal.        Thought Content: Thought content normal.     Comments: Well groomed, good eye contact, normal speech and thoughts     Diabetic Foot Exam - Simple   Simple Foot Form Diabetic Foot exam was performed with the following findings: Yes 11/17/2024 10:32 AM  Visual Inspection No deformities, no ulcerations, no other skin breakdown bilaterally: Yes Sensation Testing Intact to touch and monofilament testing bilaterally: Yes Pulse Check Posterior Tibialis and Dorsalis pulse intact bilaterally: Yes Comments      I have personally reviewed the radiology report from 07/31/24 on [facility] Mammogram.  Mammo Screening Bilateral  Anatomical Region Laterality Modality  Breast bilateral Radio Fluoroscopy   Impression   ASSESSMENT: BI-RADS Category: 1-Mammo1Yr : Negative.  Mammogram due in 1 year.  Recommendation Laterality: Both  Annual routine screening mammogram is recommended. Narrative  This result has an attachment that is not available. EXAM: MAMMO SCREENING BILATERAL ACCESSION: [id number] UN  CLINICAL INDICATION: 53 years old Female: SCREENING MAMMOGRAM  - Z12.31 - Breast cancer screening by mammogram    COMPARISON: Prior exam(s) were available and reviewed for comparison  TECHNIQUE: Bilateral breast full-field digital mammography, MLO and CC projections  BREAST DENSITY: a - The breasts are almost entirely fatty.  FINDINGS: No mammographic findings of malignancy in either breast. No significant interval change. Procedure Note  Roger Jenne Jansky, DO - 07/31/2024 Formatting of this note might be different from the original. EXAM: MAMMO  SCREENING BILATERAL ACCESSION: [id number] UN  CLINICAL INDICATION: 53 years old Female: SCREENING MAMMOGRAM  - Z12.31 - Breast cancer screening by mammogram    COMPARISON: Prior exam(s) were available and reviewed for comparison  TECHNIQUE: Bilateral breast full-field digital mammography, MLO and CC projections  BREAST DENSITY: a - The breasts are almost entirely fatty.  FINDINGS: No mammographic findings of malignancy in either breast. No significant interval change.  IMPRESSION:  ASSESSMENT: BI-RADS Category: 1-Mammo1Yr : Negative.  Mammogram due in 1 year.  Recommendation Laterality: Both  Annual routine screening mammogram is recommended. Exam End: 07/31/24 14:47   Specimen Collected: 07/31/24 14:51 Last Resulted: 07/31/24 16:02  Received From: [facility]  Result Received: 08/21/24 13:02  Results for orders placed or performed in visit on 11/17/24  [facility] MAMMOGRAPHY   Collection Time: 07/31/24 12:00 AM  Result Value Ref Range   HM Mammogram 0-4 Bi-Rad 0-4 Bi-Rad, Self Reported Normal   *Note: Due to a large number of results and/or encounters for the requested time period, some results have not been displayed. A complete set of results can be found in Results Review.      Assessment & Plan:   Problem List Items Addressed This Visit     Essential hypertension   Hypothyroidism - Primary   Type 2 diabetes mellitus with other specified complication (HCC)   Relevant Medications   MOUNJARO  7.5 MG/0.5ML Pen   Other Visit Diagnoses       Essential (primary) hypertension         Long-term current use of injectable noninsulin antidiabetic medication         Need for Streptococcus pneumoniae vaccination       Relevant Orders   Pneumococcal conjugate vaccine 20-valent (Completed)     Family history of heart disease       Relevant Orders   Ambulatory referral to Cardiology     LVH (left ventricular hypertrophy)       Relevant Orders   Ambulatory referral to  Cardiology     Abnormal EKG       Relevant Orders   Ambulatory referral to Cardiology        Updated Health Maintenance information Reviewed recent lab results with patient Encouraged improvement to lifestyle with diet and exercise Goal of weight loss   Adult Wellness Visit Annual wellness visit conducted. Pneumonia vaccine recommended due to age and previous pneumonia history. Shingles vaccine discussed but not prioritized today. Mammogram is up to date. Colon cancer screening with Cologuard kit was not completed due to sample issues. - Administered Prevnar 20 vaccine today. - He will call Cologuard company to request a new kit. - Continue routine mammogram screenings.  Type 2 diabetes mellitus with other specified complication Type 2 diabetes is well-controlled with an A1c of 6.2, improved from 7.1. Current treatment with Mounjaro  is effective, but appetite remains high. Increasing Mounjaro  dose may further improve glycemic control and aid in weight loss. - Increased Mounjaro  dose to 7.5 mg. - Monitor blood sugar levels and weight.  Essential hypertension Blood pressure is well-controlled at 134/80 mmHg. Current medication regimen is effective. Blood pressure may have contributed to kidney function changes, but blood work shows optimal kidney function. - Continue current blood pressure medication regimen.  Abnormal EKG / Left ventricular hypertrophy Noted on EKG, likely related to hypertension. Family history of coronary artery disease noted. Previous EKGs showed variability, but LVH is consistent. Referral to cardiologist Dr. Florencio for further evaluation and management is planned. Statin therapy discussed for cardiovascular prevention, but she is hesitant due to potential side effects. - Referred to Dr. Florencio, cardiologist, for further evaluation. - Will discuss potential statin therapy with cardiologist.       Orders Placed This Encounter  Procedures   [facility] MAMMOGRAPHY     This external order was created through the Results Console.   Pneumococcal conjugate vaccine 20-valent   Ambulatory referral to Cardiology    Referral Priority:   Routine    Referral Type:   Consultation    Referral Reason:   Specialty Services Required    Number of Visits Requested:   1    Meds ordered this encounter  Medications   MOUNJARO  7.5 MG/0.5ML Pen  Sig: Inject 7.5 mg into the skin once a week.    Dispense:  2 mL    Refill:  2    Dose inc from 5 to 7.5     Follow up plan: Return in about 3 months (around 02/15/2025) for 3 month DM A1c, any day, late morning.  Marsa Officer, DO [facility] 11/17/2024, 10:20 AM

## 2024-11-25 NOTE — Addendum Note (Signed)
 Addended by: EDMAN MARSA PARAS on: 11/25/2024 11:07 AM   Modules accepted: Level of Service

## 2024-12-01 ENCOUNTER — Inpatient Hospital Stay: Attending: Oncology

## 2024-12-01 DIAGNOSIS — D509 Iron deficiency anemia, unspecified: Secondary | ICD-10-CM

## 2024-12-01 DIAGNOSIS — E538 Deficiency of other specified B group vitamins: Secondary | ICD-10-CM | POA: Diagnosis present

## 2024-12-01 MED ORDER — CYANOCOBALAMIN 1000 MCG/ML IJ SOLN
1000.0000 ug | INTRAMUSCULAR | Status: DC
  Administered 2024-12-01: 11:00:00 1000 ug via INTRAMUSCULAR
  Filled 2024-12-01: qty 1

## 2024-12-04 ENCOUNTER — Encounter: Payer: Self-pay | Admitting: Internal Medicine

## 2024-12-04 ENCOUNTER — Inpatient Hospital Stay

## 2024-12-04 DIAGNOSIS — R0789 Other chest pain: Secondary | ICD-10-CM

## 2024-12-07 NOTE — Progress Notes (Addendum)
 New Patient Visit    Chief Complaint: Chief Complaint  Patient presents with   New Patient    Referred by PCP for chest pain   Date of Service: 12/04/2024 Date of Birth: Sep 24, 1971 PCP: Edman Marsa PARAS, DO  History of Present Illness: [name] is a 53 y.o.female patient who history of arrhythmia palpitation obesity on Crestor .  COPD on inhalers including Trelegy hypertension on multiple blood pressure medications referred for further evaluation  Past Medical and Surgical History  Past Medical History Past Medical History:  Diagnosis Date   Anemia    Anxiety and depression    Arrhythmia    GERD (gastroesophageal reflux disease)    Hormone deficiency    HPTH (hyperparathyroidism) (HHS-HCC)    Hypertension    Migraine headache     Past Surgical History She has a past surgical history that includes Tonsillectomy; Myringotomy & Tubes; Endoscopic Carpal Tunnel Release (Bilateral); plantar fasciitis surgery (Bilateral); Right knee arthorscopy, partial medial meniscectomy, partial synovectomy with medical plica excision (Right, 07/11/2021); Cholecystectomy (07/15/2020); Knee arthroscopy; and Rt knee arthroscopically assisted subchondralplasty of medial tibial plateay, loose body removal, tricompartmental chondroplasty (Right, 07/03/2024).   Medications and Allergies  Current Medications Current Outpatient Medications  Medication Sig Dispense Refill   albuterol  90 mcg/actuation inhaler INHALE 1 TO 2 PUFFS BY MOUTH INTO LUNGS EVERY 4 TO 6 HOURS AS NEEDED FOR SHORTNESS OF BREATH     alendronate (FOSAMAX) 70 MG tablet Take 1 tablet (70 mg total) by mouth every 7 (seven) days Take with a full glass of water first thing in the morning. Do not lie down for the next 30 min. 4 tablet 2   ARIPiprazole (ABILIFY) 2 MG tablet Take 1 tablet by mouth once daily     azelastine  (ASTELIN ) 137 mcg nasal spray Place 1 spray into both nostrils 2 (two) times daily. Use in each nostril  as directed     calcitonin, salmon, (MIACALCIN) 200 unit/actuation nasal spray Place 1 spray into the left nostril once daily 3.7 mL 1   calcium  citrate-vitamin D3 (CITRACAL+D) 315 mg-5 mcg (200 unit) tablet Take 2 tablets by mouth 2 (two) times daily with meals 60 tablet 2   cetirizine (ZYRTEC) 10 MG tablet Take 1 tablet by mouth once daily     CYANOCOBALAMIN , VITAMIN B-12, INJ Inject 1 Dose as directed every 30 (thirty) days.     cyclobenzaprine  (FLEXERIL ) 10 MG tablet      EPINEPHrine  (EPIPEN ) 0.3 mg/0.3 mL pen injector Inject 0.3 mg into the muscle.     ferrous sulfate  325 (65 FE) MG EC tablet Take 1 tablet by mouth once daily     fluconazole  (DIFLUCAN ) 150 MG tablet Take 150 mg by mouth     fluticasone  propionate (FLONASE ) 50 mcg/actuation nasal spray 2 sprays each nostril daily     fluvoxaMINE (LUVOX) 100 MG tablet Take 150 mg by mouth once     gabapentin  (NEURONTIN ) 100 MG capsule Take 100 mg by mouth 3 (three) times daily     ketoconazole  (NIZORAL ) 2 % shampoo   0   lamoTRIgine (LAMICTAL) 200 MG tablet Take 200 mg by mouth once daily     levothyroxine  (SYNTHROID , LEVOTHROID) 50 MCG tablet Take by mouth.     medroxyPROGESTERone  (DEPO-PROVERA ) 150 mg/mL injection Every three (3) months. Had injection 04/08/15.     metoprolol  SUCCinate (TOPROL -XL) 50 MG XL tablet Take 50 mg by mouth once daily TAKE WITH OR IMMEDIATELY FOLLOWING A MEAL     MOUNJARO  5  mg/0.5 mL pen injector Inject 5 mg subcutaneously every 7 (seven) days     nystatin  (MYCOSTATIN ) 100,000 unit/gram cream apply to affected area twice a day     OZEMPIC  1 mg/dose (4 mg/3 mL) pen injector INJECT 1MG  UNDER THE SKIN ONE DAY A WEEK     pantoprazole  (PROTONIX ) 40 MG DR tablet take 1 tablet by mouth twice a day 60 tablet 1   QUEtiapine (SEROQUEL) 50 MG tablet Take 50 mg by mouth     RESTASIS 0.05 % ophthalmic emulsion Place 1 drop into both eyes 2 (two) times daily     rosuvastatin  (CRESTOR ) 5 MG tablet Take 5  mg by mouth once daily     SUMAtriptan  (IMITREX ) 5 mg/actuation nasal spray Place 1 spray into one nostril as needed     traZODone (DESYREL) 50 MG tablet TAKE 1 TO 3 TABLETS BY MOUTH 1 TO 2 HOURS BEFORE BEDTIME     TRELEGY ELLIPTA  100-62.5-25 mcg inhaler Inhale 1 inhalation into the lungs once daily     tretinoin  (RETIN-A ) 0.025 % cream nightly     valsartan  (DIOVAN ) 160 MG tablet Take 160 mg by mouth once daily     metoprolol  TARTrate (LOPRESSOR ) 25 MG tablet Take 2 tablets ( 50 mg) by mouth two hours prior to cardiac CT Scan. 2 tablet 0   No current facility-administered medications for this visit.    Allergies Allerg xt-weed poll-dog fennel, Allergenic extracts, Cat/feline products, Dog epithelium allergenic extract, Mite extract, Tetracycline, Venom-honey bee, Adhesive, and Milk containing products (dairy)  Social and Family History  Social History  reports that she has been smoking cigarettes. She has a 32 pack-year smoking history. She has never used smokeless tobacco. She reports current alcohol use of about 3.0 standard drinks of alcohol per week. She reports that she does not use drugs.  Family History family history includes Alcohol abuse in her maternal uncle; Anemia in her maternal grandmother and mother; COPD in her mother; Depression in her maternal grandmother and mother; Diabetes type II in her father; High blood pressure (Hypertension) in her maternal grandfather, maternal uncle, and mother; Reflux disease in her maternal grandfather and mother; Stroke in her maternal grandfather and mother; Ulcers in her maternal grandfather.   Review of Systems   Review of Systems: The patient denies chest pain, shortness of breath, orthopnea, paroxysmal nocturnal dyspnea, pedal edema, palpitations, heart racing, presyncope, syncope. Review of 12 Systems is negative except as described above.  Physical Examination   Vitals:BP (!) 142/80   Pulse 78   Ht 170.2 cm (5' 7)   Wt (!)  109.3 kg (241 lb)   SpO2 97%   BMI 37.75 kg/m  Ht:170.2 cm (5' 7) Wt:(!) 109.3 kg (241 lb) ADJ:Anib surface area is 2.27 meters squared. Body mass index is 37.75 kg/m.  HEENT: Pupils equally reactive to light and accomodation  Neck: Supple without thyromegaly, carotid pulses 2+ Lungs: clear to auscultation bilaterally; no wheezes, rales, rhonchi Heart: Regular rate and rhythm.  No gallops, murmurs or rub Abdomen: soft nontender, nondistended, with normal bowel sounds Extremities: no cyanosis, clubbing, or edema Peripheral Pulses: 2+ in all extremities, 2+ femoral pulses bilaterally Neurologic: Alert and oriented X3; speech intact; face symmetrical; moves all extremities well  Assessment   53 y.o. female with  1. Atypical chest pain   2. Heart palpitations   3. Palpitations   4. Essential (primary) hypertension   5. Primary hypertension   6. Severe anxiety with panic  Plan  Chest pain possibly cardiac would recommend noninvasive evaluation including CTA with FFR Palpitations intermittent recurrent consider continue diltiazem  metoprolol  therapy Holter monitor for evaluation of possible arrhythmia Hypertension reasonable control blood pressure 130/82 continue metoprolol  diltiazem  valsartan  Generalized anxiety continue current therapy on Seroquel Lamictal Neurontin  Abilify Obesity recommend weight loss exercise portion control Tobacco abuse right patient refrain from tobacco abuse GERD continue reflux therapy with Protonix  as needed for symptom control COPD maintained on inhalers including Trelegy follow-up with pulmonary Hyperlipidemia agree with Crestor  therapy for lipid management LDL goal should be less than 70 Have the patient follow-up 6 months       Return in about 2 months (around 02/04/2025).  DWAYNE D CALLWOOD, MD  This dictation was prepared with dragon dictation.  Any transcription errors that result from this process are unintentional.

## 2024-12-08 ENCOUNTER — Encounter: Payer: Self-pay | Admitting: Internal Medicine

## 2024-12-08 DIAGNOSIS — R0789 Other chest pain: Secondary | ICD-10-CM

## 2024-12-08 DIAGNOSIS — R55 Syncope and collapse: Secondary | ICD-10-CM

## 2024-12-08 DIAGNOSIS — R0609 Other forms of dyspnea: Secondary | ICD-10-CM

## 2024-12-15 ENCOUNTER — Other Ambulatory Visit: Payer: Self-pay | Admitting: Internal Medicine

## 2024-12-15 DIAGNOSIS — E785 Hyperlipidemia, unspecified: Secondary | ICD-10-CM

## 2024-12-15 DIAGNOSIS — R0789 Other chest pain: Secondary | ICD-10-CM

## 2024-12-15 DIAGNOSIS — R9439 Abnormal result of other cardiovascular function study: Secondary | ICD-10-CM

## 2024-12-15 DIAGNOSIS — I2089 Other forms of angina pectoris: Secondary | ICD-10-CM

## 2024-12-16 ENCOUNTER — Ambulatory Visit: Admitting: Licensed Practical Nurse

## 2024-12-16 ENCOUNTER — Encounter: Payer: Self-pay | Admitting: Licensed Practical Nurse

## 2024-12-16 ENCOUNTER — Telehealth

## 2024-12-16 VITALS — BP 152/83 | HR 88 | Ht 67.0 in | Wt 237.0 lb

## 2024-12-16 DIAGNOSIS — N951 Menopausal and female climacteric states: Secondary | ICD-10-CM

## 2024-12-16 MED ORDER — PROGESTERONE MICRONIZED 100 MG PO CAPS: 100.0000 mg | ORAL_CAPSULE | Freq: Every day | ORAL | 6 refills | Status: AC

## 2024-12-16 MED ORDER — ESTRADIOL 0.01 % VA CREA: 1.0000 | TOPICAL_CREAM | Freq: Every day | VAGINAL | 12 refills | Status: AC

## 2024-12-16 MED ORDER — ESTRADIOL 0.025 MG/24HR TD PTWK: 0.0250 mg | MEDICATED_PATCH | TRANSDERMAL | 12 refills | Status: AC

## 2024-12-16 NOTE — Progress Notes (Signed)
 "   Edman Marsa PARAS, DO   No chief complaint on file.   HPI:      Amanda Webb is a 53 y.o. [phone] whose LMP was No LMP recorded. Patient is perimenopausal., presents today for HRT  Was on Depo for 15 years, used to control cycles, stopped Depo labs confirmed Menopause  SXS: Night sweats, vaginal dryness, mood swings for the last few years has not tried anything  Recently married in May, has not had sex since, he is not interested and has not done much to improve this, they do not have much intimacy   Smokes 1 pack a day, hx HTN seeing cardio, recently medications were changed,  remote hx of Migraines      Patient Active Problem List   Diagnosis Date Noted   Major depressive disorder, recurrent, in partial remission 11/08/2020   Cough 10/21/2020   Status post laparoscopic cholecystectomy 07/28/2020   Morbid obesity (HCC) 07/05/2020   Hepatic steatosis 06/23/2020   Lymphedema 03/09/2020   Chronic venous insufficiency 03/09/2020   Bilateral lower extremity edema 02/16/2020   Female cystocele 10/21/2018   Urinary incontinence, mixed 10/21/2018   B12 deficiency 04/08/2018   Iron  deficiency anemia 03/14/2018   GAD (generalized anxiety disorder) 03/10/2018   Acute right-sided low back pain 10/28/2017   Tobacco abuse 05/09/2017   Multiple thyroid  nodules 12/28/2016   Simple chronic bronchitis (HCC) 12/13/2016   BMI 40.0-44.9, adult (HCC) 09/26/2016   HLD (hyperlipidemia) 09/26/2016   Vitamin D deficiency 09/26/2016   Type 2 diabetes mellitus with other specified complication (HCC) 06/17/2015   Candidiasis of skin and nail 02/21/2015   Candidal intertrigo 02/21/2015   Cellulitis of right lower extremity 02/18/2015   Benign cyst of right kidney 01/17/2015   Chronic sinusitis 11/15/2014   Whiplash injury 09/03/2014   Heart palpitations 04/02/2014   Palpitations 04/02/2014   Allergic rhinitis 12/31/2013   Spondylosis of lumbar region without myelopathy or  radiculopathy 12/28/2013   Chronic low back pain 09/28/2013   Other fatigue 09/28/2013   Hypothyroidism 09/28/2013   Alteration of body temperature 09/28/2013   Other fatigue 09/28/2013   Plantar fasciitis, bilateral 07/10/2013   Tarsal tunnel syndrome of right side 07/10/2013   Plantar fascial fibromatosis 07/10/2013   Encounter for surveillance of injectable contraceptive 05/13/2013   Diuresis excessive 03/24/2013   Polyuria 03/24/2013   Abnormal uterine and vaginal bleeding, unspecified 03/24/2013   Arthritis, multiple joint involvement 03/17/2013   Fibroblastic disorder 07/28/2012   Borderline personality disorder (HCC) 12/17/2010   [facility]line [facility] disorder [facility]) 12/17/2010   Essential hypertension 02/18/2010   Carpal tunnel syndrome on both sides 12/17/2006   Major depressive disorder, single episode 02/21/1998   Major depressive disorder, single episode 02/21/1998   Disk prolapse 04/17/1995   Depression with anxiety 03/18/1995   OCD (obsessive compulsive disorder) 03/18/1995   Severe anxiety with panic 03/18/1995    Past Surgical History:  Procedure Laterality Date   CARPAL TUNNEL RELEASE Bilateral 2011   FOOT SURGERY Right 2013   Plantar fascia   KNEE ARTHROSCOPY WITH MEDIAL MENISECTOMY Right 07/11/2021   Procedure: Right knee arthroscopic partial medial meniscectomy;  Surgeon: Tobie Priest, MD;  [facility];  Service: Orthopedics;  Laterality: Right;   KNEE ARTHROSCOPY WITH MEDIAL MENISECTOMY Right 07/03/2024   Procedure: ARTHROSCOPY, KNEE, WITH MEDIAL MENISCECTOMY;  Surgeon: Tobie Priest, MD;  Location: [facility];  Service: Orthopedics;  Laterality: Right;  Right knee arthroscopy, partial medial meniscectomy, and subchondroplasty of the  medial tibial plateau   KNEE ARTHROSCOPY WITH SUBCHONDROPLASTY Right 07/03/2024   Procedure: ARTHROSCOPY, KNEE, WITH SUBCHONDROPLASTY;  Surgeon: Tobie Priest, MD;  Location: [facility];  Service: Orthopedics;   Laterality: Right;  Right knee arthroscopy, partial medial meniscectomy, and subchondroplasty of the medial tibial plateau   ROBOTIC ASSISTED LAPAROSCOPIC CHOLECYSTECTOMY  07/15/2020   Dr Lane   TONSILLECTOMY  1979   with ear tubes    Family History  Problem Relation Age of Onset   Depression Mother    Mental illness Mother    Alcohol abuse Mother    Lung cancer Mother    Cancer Maternal Grandmother    Diabetes Maternal Grandmother    Ovarian cancer Maternal Grandmother    Kidney disease Maternal Grandfather    Diabetes Father    Mental illness Father    Depression Father    Drug abuse Father    Depression Paternal Grandfather    Drug abuse Paternal Grandfather    Bladder Cancer Neg Hx    Breast cancer Neg Hx     Social History   Socioeconomic History   Marital status: Divorced    Spouse name: Not on file   Number of children: Not on file   Years of education: Some college   Highest education level: Some college, no degree  Occupational History   Occupation: disability   Tobacco Use   Smoking status: Every Day    Current packs/day: 1.00    Average packs/day: 1 pack/day for 32.0 years (32.0 ttl pk-yrs)    Types: Cigarettes    Passive exposure: Current   Smokeless tobacco: Never   Tobacco comments:    1 ppd/ 11/22/2020  Vaping Use   Vaping status: Never Used  Substance and Sexual Activity   Alcohol use: Yes    Alcohol/week: 3.0 standard drinks of alcohol    Types: 3 Glasses of wine per week    Comment: per week   Drug use: No   Sexual activity: Not Currently    Birth control/protection: None  Other Topics Concern   Not on file  Social History Narrative   Lives with mom   Social Drivers of Health   Tobacco Use: High Risk (12/16/2024)   Received from [facility]   Patient History    Smoking Tobacco Use: Every Day    Smokeless Tobacco Use: Never    Passive Exposure: Not on file  Financial Resource Strain: Low Risk  (12/04/2024)    Received from [facility]   Overall Financial Resource Strain (CARDIA)    Difficulty of Paying Living Expenses: Not hard at all  Food Insecurity: No Food Insecurity (12/04/2024)   Received from [facility]   Epic    Within the past 12 months, you worried that your food would run out before you got the money to buy more.: Never true    Within the past 12 months, the food you bought just didn't last and you didn't have money to get more.: Never true  Transportation Needs: No Transportation Needs (12/04/2024)   Received from [facility] - Transportation    In the past 12 months, has lack of transportation kept you from medical appointments or from getting medications?: No    Lack of Transportation (Non-Medical): No  Physical Activity: Inactive (10/07/2024)   Exercise Vital Sign    Days of Exercise per Week: 0 days    Minutes of Exercise per Session: Not on file  Stress: No Stress Concern Present (10/07/2024)   [employer] - Occupational Stress Questionnaire    Feeling of Stress: Only a little  Recent Concern: Stress - Stress Concern Present (07/24/2024)   [employer] - Occupational Stress Questionnaire    Feeling of Stress: Very much  Social Connections: Socially Integrated (10/07/2024)   Social Connection and Isolation Panel    Frequency of Communication with Friends and Family: More than three times a week    Frequency of Social Gatherings with Friends and Family: More than three times a week    Attends Religious Services: More than 4 times per year    Active Member of [employer] or Organizations: Yes    Attends Engineer, Structural: More than 4 times per year    Marital Status: Married  Recent Concern: Social Connections - Moderately Isolated (07/24/2024)   Social Connection and Isolation Panel    Frequency of Communication with Friends and Family: Once a week     Frequency of Social Gatherings with Friends and Family: Never    Attends Religious Services: More than 4 times per year    Active Member of [employer] or Organizations: No    Attends Engineer, Structural: Not on file    Marital Status: Married  Catering Manager Violence: Not on file  Depression (PHQ2-9): Medium Risk (11/17/2024)   Depression (PHQ2-9)    PHQ-2 Score: 10  Alcohol Screen: Low Risk (10/07/2024)   Alcohol Screen    Last Alcohol Screening Score (AUDIT): 2  Housing: Low Risk  (12/04/2024)   Received from [facility]   Epic    In the last 12 months, was there a time when you were not able to pay the mortgage or rent on time?: No    In the past 12 months, how many times have you moved where you were living?: 0    At any time in the past 12 months, were you homeless or living in a shelter (including now)?: No  Utilities: Not At Risk (12/04/2024)   Received from [facility]   Epic    In the past 12 months has the electric, gas, oil, or water company threatened to shut off services in your home?: No  Health Literacy: Not on file    Outpatient Medications Prior to Visit  Medication Sig Dispense Refill   Accu-Chek FastClix Lancets MISC USE TO CHECK BLOOD SUGAR UP TO TWICE DAILY AS DIRECTED 102 each 0   albuterol  (VENTOLIN  HFA) 108 (90 Base) MCG/ACT inhaler INHALE 1 TO 2 PUFFS BY MOUTH EVERY 4 TO 6 HOURS AS NEEDED FOR SHORTNESS OF BREATH 18 g 2   albuterol  (VENTOLIN  HFA) 108 (90 Base) MCG/ACT inhaler Inhale 2 puffs into the lungs every 6 (six) hours as needed for wheezing or shortness of breath. 8 g 0   ARIPiprazole (ABILIFY) 2 MG tablet Take 2 mg by mouth every morning.      azelastine  (ASTELIN ) 0.1 % nasal spray Place 1 spray into both nostrils 2 (two) times daily. Use in each nostril as directed 30 mL 5   cetirizine (ZYRTEC) 10 MG tablet Take 10 mg by mouth daily with lunch.     Cyanocobalamin  (B-12 COMPLIANCE INJECTION IJ) Inject 1 Dose as  directed every 30 (thirty) days.     fluticasone  (FLONASE ) 50 MCG/ACT nasal spray Place 2 sprays into both nostrils daily. SHAKE LIQUID AND USE 2 SPRAYS IN EACH NOSTRIL EVERY DAY 9.9 mL  6   fluvoxaMINE (LUVOX) 100 MG tablet Take 150 mg by mouth every morning.      gabapentin  (NEURONTIN ) 100 MG capsule Take 200 mg by mouth daily.     glucose blood (ACCU-CHEK GUIDE) test strip CHECK BLOOD SUGAR UP TO EVERY DAY AS DIRECTED 200 strip 5   hydrOXYzine  (VISTARIL ) 25 MG capsule TAKE 1 CAPSULE(25 MG) BY MOUTH EVERY 8 HOURS AS NEEDED 30 capsule 0   lamoTRIgine (LAMICTAL) 200 MG tablet Take 200 mg by mouth daily.     levothyroxine  (SYNTHROID ) 50 MCG tablet Take 1 tablet (50 mcg total) by mouth daily before breakfast. 90 tablet 1   metoprolol  succinate (TOPROL -XL) 50 MG 24 hr tablet Take 1 tablet (50 mg total) by mouth daily. Take with or immediately following a meal. 90 tablet 3   montelukast  (SINGULAIR ) 10 MG tablet Take 1 tablet (10 mg total) by mouth at bedtime. 90 tablet 1   MOUNJARO  7.5 MG/0.5ML Pen Inject 7.5 mg into the skin once a week. 2 mL 2   Multiple Vitamin (MULTIVITAMIN WITH MINERALS) TABS tablet Take 1 tablet by mouth daily.     pantoprazole  (PROTONIX ) 20 MG tablet TAKE 1 TABLET(20 MG) BY MOUTH DAILY 90 tablet 1   TRELEGY ELLIPTA  100-62.5-25 MCG/ACT AEPB INHALE 1 PUFF INTO THE LUNGS DAILY 180 each 1   tretinoin  (RETIN-A ) 0.025 % cream APPLY EXTERNALLY TO THE AFFECTED AREA AT BEDTIME AS NEEDED 45 g 2   valsartan  (DIOVAN ) 160 MG tablet Take 1 tablet (160 mg total) by mouth daily. 90 tablet 3   famotidine  (PEPCID ) 20 MG tablet Take 1 tablet (20 mg total) by mouth 2 (two) times daily. (Patient not taking: Reported on 11/17/2024) 60 tablet 0   rosuvastatin  (CRESTOR ) 5 MG tablet Take 1 tablet (5 mg total) by mouth at bedtime. (Patient not taking: Reported on 11/17/2024) 90 tablet 3   Facility-Administered Medications Prior to Visit  Medication Dose Route Frequency Provider Last Rate Last Admin    cyanocobalamin  (VITAMIN B12) injection 1,000 mcg  1,000 mcg Intramuscular Once Rao, Archana C, MD       cyanocobalamin  (VITAMIN B12) injection 1,000 mcg  1,000 mcg Intramuscular Q30 days Rao, Archana C, MD   1,000 mcg at 02/05/24 0909      ROS:  Review of Systems see HPI    OBJECTIVE:   Vitals:  BP (!) 155/87   Pulse 90   Ht 5' 7 (1.702 m)   Wt 237 lb (107.5 kg)   BMI 37.12 kg/m   Physical Exam Constitutional:      Appearance: Normal appearance.  Pulmonary:     Effort: Pulmonary effort is normal.  Musculoskeletal:     Cervical back: Normal range of motion.  Neurological:     Mental Status: She is alert.  Psychiatric:        Mood and Affect: Mood normal.        Thought Content: Thought content normal.     Results: No results found. However, due to the size of the patient record, not all encounters were searched. Please check Results Review for a complete set of results.   Assessment/Plan: 1. Menopausal symptom (Primary) - estradiol  (ESTRACE ) 0.01 % CREA vaginal cream; Place 1 Applicatorful vaginally at bedtime. Apply peas sized amount nightly for 2 weeks, then 2 times per week  Dispense: 42.5 g; Refill: 12 - estradiol  (CLIMARA ) 0.025 mg/24hr patch; Place 1 patch (0.025 mg total) onto the skin once a week.  Dispense: 4 patch; Refill: 12 -  progesterone (PROMETRIUM) 100 MG capsule; Take 1 capsule (100 mg total) by mouth daily.  Dispense: 30 capsule; Refill: 6     Meds ordered this encounter  Medications   estradiol  (ESTRACE ) 0.01 % CREA vaginal cream    Sig: Place 1 Applicatorful vaginally at bedtime. Apply peas sized amount nightly for 2 weeks, then 2 times per week    Dispense:  42.5 g    Refill:  12   estradiol  (CLIMARA ) 0.025 mg/24hr patch    Sig: Place 1 patch (0.025 mg total) onto the skin once a week.    Dispense:  4 patch    Refill:  12   progesterone (PROMETRIUM) 100 MG capsule    Sig: Take 1 capsule (100 mg total) by mouth daily.    Dispense:  30  capsule    Refill:  6    Pt had annual in August therefore exam not done Discussed risks of VTE with hx of smoking and HTN, but may try patch and vaginal estrogen  RTC after 6 weeks    JINNIE HERO [facility], CNM 12/16/2024 4:40 PM      "

## 2024-12-18 ENCOUNTER — Encounter: Payer: Self-pay | Admitting: Licensed Practical Nurse

## 2024-12-22 ENCOUNTER — Other Ambulatory Visit: Payer: Self-pay | Admitting: Licensed Practical Nurse

## 2024-12-24 ENCOUNTER — Other Ambulatory Visit: Payer: Self-pay

## 2024-12-24 DIAGNOSIS — K219 Gastro-esophageal reflux disease without esophagitis: Secondary | ICD-10-CM

## 2024-12-31 ENCOUNTER — Telehealth: Admitting: Physician Assistant

## 2024-12-31 ENCOUNTER — Other Ambulatory Visit: Payer: Self-pay | Admitting: Family Medicine

## 2024-12-31 DIAGNOSIS — B9689 Other specified bacterial agents as the cause of diseases classified elsewhere: Secondary | ICD-10-CM

## 2024-12-31 DIAGNOSIS — J410 Simple chronic bronchitis: Secondary | ICD-10-CM

## 2024-12-31 DIAGNOSIS — J208 Acute bronchitis due to other specified organisms: Secondary | ICD-10-CM

## 2024-12-31 MED ORDER — AZITHROMYCIN 250 MG PO TABS: ORAL_TABLET | ORAL | 0 refills | Status: AC

## 2024-12-31 MED ORDER — PROMETHAZINE-DM 6.25-15 MG/5ML PO SYRP: 5.0000 mL | ORAL_SOLUTION | Freq: Four times a day (QID) | ORAL | 0 refills | Status: AC | PRN

## 2024-12-31 MED ORDER — PREDNISONE 20 MG PO TABS: 40.0000 mg | ORAL_TABLET | Freq: Every day | ORAL | 0 refills | Status: AC

## 2024-12-31 NOTE — Progress Notes (Signed)
 " Virtual Visit Consent   Amanda Webb, you are scheduled for a virtual visit with a [facility] provider today. Just as with appointments in the office, your consent must be obtained to participate. Your consent will be active for this visit and any virtual visit you may have with one of our providers in the next 365 days. If you have a MyChart account, a copy of this consent can be sent to you electronically.  As this is a virtual visit, video technology does not allow for your provider to perform a traditional examination. This may limit your provider's ability to fully assess your condition. If your provider identifies any concerns that need to be evaluated in person or the need to arrange testing (such as labs, EKG, etc.), we will make arrangements to do so. Although advances in technology are sophisticated, we cannot ensure that it will always work on either your end or our end. If the connection with a video visit is poor, the visit may have to be switched to a telephone visit. With either a video or telephone visit, we are not always able to ensure that we have a secure connection.  By engaging in this virtual visit, you consent to the provision of healthcare and authorize for your insurance to be billed (if applicable) for the services provided during this visit. Depending on your insurance coverage, you may receive a charge related to this service.  I need to obtain your verbal consent now. Are you willing to proceed with your visit today? Amanda Webb has provided verbal consent on 12/31/2024 for a virtual visit (video or telephone). Delon CHRISTELLA Dickinson, PA-C  Date: 12/31/2024 9:30 AM   Virtual Visit via Video Note   I, Delon CHRISTELLA Dickinson, connected with  Amanda Webb  ([id number], 1971-09-01) on 12/31/24 at  8:45 AM EST by a video-enabled telemedicine application and verified that I am speaking with the correct person using two identifiers.  Location: Patient: Virtual Visit Location  Patient: Home Provider: Virtual Visit Location Provider: Home Office   I discussed the limitations of evaluation and management by telemedicine and the availability of in person appointments. The patient expressed understanding and agreed to proceed.    History of Present Illness: Amanda Webb is a 54 y.o. who identifies as a female who was assigned female at birth, and is being seen today for cough and congestion.  HPI: URI  This is a new problem. The current episode started in the past 7 days. The problem has been gradually worsening. There has been no fever. Associated symptoms include congestion, coughing, headaches, a plugged ear sensation, rhinorrhea, sinus pain, a sore throat and wheezing. Pertinent negatives include no diarrhea, ear pain, nausea, swollen glands or vomiting. Treatments tried: Mucinex , tessalon , tylenol , ibuprofen , humidifier. The treatment provided no relief.   Mother is in hospital with pneumonia. Had negative Covid, Flu, Strep test yesterday at [facility]   Problems:  Patient Active Problem List   Diagnosis Date Noted   Major depressive disorder, recurrent, in partial remission 11/08/2020   Cough 10/21/2020   Status post laparoscopic cholecystectomy 07/28/2020   Morbid obesity (HCC) 07/05/2020   Hepatic steatosis 06/23/2020   Lymphedema 03/09/2020   Chronic venous insufficiency 03/09/2020   Bilateral lower extremity edema 02/16/2020   Female cystocele 10/21/2018   Urinary incontinence, mixed 10/21/2018   B12 deficiency 04/08/2018   Iron  deficiency anemia 03/14/2018   GAD (generalized anxiety disorder) 03/10/2018   Acute right-sided low back pain 10/28/2017  Tobacco abuse 05/09/2017   Multiple thyroid  nodules 12/28/2016   Simple chronic bronchitis (HCC) 12/13/2016   BMI 40.0-44.9, adult (HCC) 09/26/2016   HLD (hyperlipidemia) 09/26/2016   Vitamin D deficiency 09/26/2016   Type 2 diabetes mellitus with other specified complication (HCC) 06/17/2015    Candidiasis of skin and nail 02/21/2015   Candidal intertrigo 02/21/2015   Cellulitis of right lower extremity 02/18/2015   Benign cyst of right kidney 01/17/2015   Chronic sinusitis 11/15/2014   Whiplash injury 09/03/2014   Heart palpitations 04/02/2014   Palpitations 04/02/2014   Allergic rhinitis 12/31/2013   Spondylosis of lumbar region without myelopathy or radiculopathy 12/28/2013   Chronic low back pain 09/28/2013   Other fatigue 09/28/2013   Hypothyroidism 09/28/2013   Alteration of body temperature 09/28/2013   Other fatigue 09/28/2013   Plantar fasciitis, bilateral 07/10/2013   Tarsal tunnel syndrome of right side 07/10/2013   Plantar fascial fibromatosis 07/10/2013   Encounter for surveillance of injectable contraceptive 05/13/2013   Diuresis excessive 03/24/2013   Polyuria 03/24/2013   Abnormal uterine and vaginal bleeding, unspecified 03/24/2013   Arthritis, multiple joint involvement 03/17/2013   Fibroblastic disorder 07/28/2012   Borderline personality disorder (HCC) 12/17/2010   Borderline personality disorder in adult [facility]) 12/17/2010   Essential hypertension 02/18/2010   Carpal tunnel syndrome on both sides 12/17/2006   Major depressive disorder, single episode 02/21/1998   Major depressive disorder, single episode 02/21/1998   Disk prolapse 04/17/1995   Depression with anxiety 03/18/1995   OCD (obsessive compulsive disorder) 03/18/1995   Severe anxiety with panic 03/18/1995    Allergies: Allergies[1] Medications: Current Medications[2]  Observations/Objective: Patient is well-developed, well-nourished in no acute distress.  Resting comfortably  Head is normocephalic, atraumatic.  No labored breathing.  Speech is clear and coherent with logical content.  Patient is alert and oriented at baseline.    Assessment and Plan: 1. Acute bacterial bronchitis (Primary) - azithromycin  (ZITHROMAX ) 250 MG tablet; Take 2 tablets on day 1, then 1 tablet daily on  days 2 through 5  Dispense: 6 tablet; Refill: 0 - predniSONE  (DELTASONE ) 20 MG tablet; Take 2 tablets (40 mg total) by mouth daily with breakfast.  Dispense: 14 tablet; Refill: 0 - promethazine -dextromethorphan (PROMETHAZINE -DM) 6.25-15 MG/5ML syrup; Take 5 mLs by mouth 4 (four) times daily as needed.  Dispense: 118 mL; Refill: 0  - Worsening over a week despite OTC medications - Will treat with Z-pack and Promethazine  DM - Add Prednisone  for inflammation - Can continue Mucinex  (PLAIN) during the daytime as needed - Continue previously prescribed Tessalon  perles - Push fluids.  - Rest.  - Steam and humidifier can help - Seek in person evaluation if worsening or symptoms fail to improve    Follow Up Instructions: I discussed the assessment and treatment plan with the patient. The patient was provided an opportunity to ask questions and all were answered. The patient agreed with the plan and demonstrated an understanding of the instructions.  A copy of instructions were sent to the patient via MyChart unless otherwise noted below.    The patient was advised to call back or seek an in-person evaluation if the symptoms worsen or if the condition fails to improve as anticipated.    Delon CHRISTELLA Dickinson, PA-C     [1]  Allergies Allergen Reactions   Bee Venom Anaphylaxis, Hives and Swelling    Carries Epi pen.    Cat Dander Itching, Other (See Comments) and Swelling    Allergic to trees, nuts, wheat,  grass, cats & dogs - itchy watery eyes, swelling. Uses Zyrtec & Flonase  & Benadryl if really bad. Used to get allergy shots. Allergic to trees, nuts, wheat, grass, cats & dogs - itchy watery eyes, swelling. Uses Zyrtec & Flonase  & Benadryl if really bad. Used to get allergy shots.   Dog Epithelium Cough and Shortness Of Breath   Dog Epithelium (Canis Lupus Familiaris) Shortness Of Breath   Dog Fennel Cough and Shortness Of Breath   Dog Fennel Allergy Skin Test Shortness Of Breath   Dust  Mite Extract Cough and Shortness Of Breath   Tetracyclines & Related Nausea And Vomiting   Lactose Diarrhea and Nausea And Vomiting   Milk (Cow) Diarrhea and Nausea And Vomiting   Milk Protein Diarrhea and Nausea And Vomiting   Milk-Related Compounds Diarrhea and Nausea And Vomiting   Tape Other (See Comments) and Rash    Needs to use paper tape. Breaks out with severe rash, pulls skin off when using adhesive.   Wound Dressing Adhesive Rash    Other reaction(s): Other Needs to use paper tape. Breaks out with severe rash, pulls skin off when using adhesive.  Needs to use paper tape. Breaks out with severe rash, pulls skin off when using adhesive. Needs to use paper tape. Breaks out with severe rash, pulls skin off when using adhesive.  [2]  Current Outpatient Medications:    azithromycin  (ZITHROMAX ) 250 MG tablet, Take 2 tablets on day 1, then 1 tablet daily on days 2 through 5, Disp: 6 tablet, Rfl: 0   predniSONE  (DELTASONE ) 20 MG tablet, Take 2 tablets (40 mg total) by mouth daily with breakfast., Disp: 14 tablet, Rfl: 0   promethazine -dextromethorphan (PROMETHAZINE -DM) 6.25-15 MG/5ML syrup, Take 5 mLs by mouth 4 (four) times daily as needed., Disp: 118 mL, Rfl: 0   Accu-Chek FastClix Lancets MISC, USE TO CHECK BLOOD SUGAR UP TO TWICE DAILY AS DIRECTED, Disp: 102 each, Rfl: 0   albuterol  (VENTOLIN  HFA) 108 (90 Base) MCG/ACT inhaler, INHALE 1 TO 2 PUFFS BY MOUTH EVERY 4 TO 6 HOURS AS NEEDED FOR SHORTNESS OF BREATH, Disp: 18 g, Rfl: 2   albuterol  (VENTOLIN  HFA) 108 (90 Base) MCG/ACT inhaler, Inhale 2 puffs into the lungs every 6 (six) hours as needed for wheezing or shortness of breath., Disp: 8 g, Rfl: 0   ARIPiprazole (ABILIFY) 2 MG tablet, Take 2 mg by mouth every morning. , Disp: , Rfl:    azelastine  (ASTELIN ) 0.1 % nasal spray, Place 1 spray into both nostrils 2 (two) times daily. Use in each nostril as directed, Disp: 30 mL, Rfl: 5   cetirizine (ZYRTEC) 10 MG tablet, Take 10 mg by mouth  daily with lunch., Disp: , Rfl:    Cyanocobalamin  (B-12 COMPLIANCE INJECTION IJ), Inject 1 Dose as directed every 30 (thirty) days., Disp: , Rfl:    estradiol  (CLIMARA ) 0.025 mg/24hr patch, Place 1 patch (0.025 mg total) onto the skin once a week., Disp: 4 patch, Rfl: 12   estradiol  (ESTRACE ) 0.01 % CREA vaginal cream, Place 1 Applicatorful vaginally at bedtime. Apply peas sized amount nightly for 2 weeks, then 2 times per week, Disp: 42.5 g, Rfl: 12   famotidine  (PEPCID ) 20 MG tablet, Take 1 tablet (20 mg total) by mouth 2 (two) times daily. (Patient not taking: Reported on 11/17/2024), Disp: 60 tablet, Rfl: 0   fluticasone  (FLONASE ) 50 MCG/ACT nasal spray, Place 2 sprays into both nostrils daily. SHAKE LIQUID AND USE 2 SPRAYS IN EACH NOSTRIL EVERY DAY,  Disp: 9.9 mL, Rfl: 6   fluvoxaMINE (LUVOX) 100 MG tablet, Take 150 mg by mouth every morning. , Disp: , Rfl:    gabapentin  (NEURONTIN ) 100 MG capsule, Take 200 mg by mouth daily., Disp: , Rfl:    glucose blood (ACCU-CHEK GUIDE) test strip, CHECK BLOOD SUGAR UP TO EVERY DAY AS DIRECTED, Disp: 200 strip, Rfl: 5   hydrOXYzine  (VISTARIL ) 25 MG capsule, TAKE 1 CAPSULE(25 MG) BY MOUTH EVERY 8 HOURS AS NEEDED, Disp: 30 capsule, Rfl: 0   lamoTRIgine (LAMICTAL) 200 MG tablet, Take 200 mg by mouth daily., Disp: , Rfl:    levothyroxine  (SYNTHROID ) 50 MCG tablet, Take 1 tablet (50 mcg total) by mouth daily before breakfast., Disp: 90 tablet, Rfl: 1   metoprolol  succinate (TOPROL -XL) 50 MG 24 hr tablet, Take 1 tablet (50 mg total) by mouth daily. Take with or immediately following a meal., Disp: 90 tablet, Rfl: 3   montelukast  (SINGULAIR ) 10 MG tablet, Take 1 tablet (10 mg total) by mouth at bedtime., Disp: 90 tablet, Rfl: 1   MOUNJARO  7.5 MG/0.5ML Pen, Inject 7.5 mg into the skin once a week., Disp: 2 mL, Rfl: 2   Multiple Vitamin (MULTIVITAMIN WITH MINERALS) TABS tablet, Take 1 tablet by mouth daily., Disp: , Rfl:    pantoprazole  (PROTONIX ) 20 MG tablet, TAKE 1  TABLET(20 MG) BY MOUTH DAILY, Disp: 90 tablet, Rfl: 1   progesterone  (PROMETRIUM ) 100 MG capsule, Take 1 capsule (100 mg total) by mouth daily., Disp: 30 capsule, Rfl: 6   rosuvastatin  (CRESTOR ) 5 MG tablet, Take 1 tablet (5 mg total) by mouth at bedtime. (Patient not taking: Reported on 11/17/2024), Disp: 90 tablet, Rfl: 3   TRELEGY ELLIPTA  100-62.5-25 MCG/ACT AEPB, INHALE 1 PUFF INTO THE LUNGS DAILY, Disp: 180 each, Rfl: 1   tretinoin  (RETIN-A ) 0.025 % cream, APPLY EXTERNALLY TO THE AFFECTED AREA AT BEDTIME AS NEEDED, Disp: 45 g, Rfl: 2   valsartan  (DIOVAN ) 160 MG tablet, Take 1 tablet (160 mg total) by mouth daily., Disp: 90 tablet, Rfl: 3 No current facility-administered medications for this visit.  Facility-Administered Medications Ordered in Other Visits:    cyanocobalamin  (VITAMIN B12) injection 1,000 mcg, 1,000 mcg, Intramuscular, Once, Melanee Annah BROCKS, MD   cyanocobalamin  (VITAMIN B12) injection 1,000 mcg, 1,000 mcg, Intramuscular, Q30 days, Melanee Annah BROCKS, MD, 1,000 mcg at 02/05/24 9090  "

## 2024-12-31 NOTE — Patient Instructions (Signed)
 " Amanda Webb, thank you for joining Delon CHRISTELLA Dickinson, PA-C for today's virtual visit.  While this provider is not your primary care provider (PCP), if your PCP is located in our provider database this encounter information will be shared with them immediately following your visit.   A [facility] MyChart account gives you access to today's visit and all your visits, tests, and labs performed at [facility]  click here if you don't have a [facility] MyChart account or go to [email or web address]  Consent: (Patient) Amanda Webb provided verbal consent for this virtual visit at the beginning of the encounter.  Current Medications:  Current Outpatient Medications:    azithromycin  (ZITHROMAX ) 250 MG tablet, Take 2 tablets on day 1, then 1 tablet daily on days 2 through 5, Disp: 6 tablet, Rfl: 0   predniSONE  (DELTASONE ) 20 MG tablet, Take 2 tablets (40 mg total) by mouth daily with breakfast., Disp: 14 tablet, Rfl: 0   promethazine -dextromethorphan (PROMETHAZINE -DM) 6.25-15 MG/5ML syrup, Take 5 mLs by mouth 4 (four) times daily as needed., Disp: 118 mL, Rfl: 0   Accu-Chek FastClix Lancets MISC, USE TO CHECK BLOOD SUGAR UP TO TWICE DAILY AS DIRECTED, Disp: 102 each, Rfl: 0   albuterol  (VENTOLIN  HFA) 108 (90 Base) MCG/ACT inhaler, INHALE 1 TO 2 PUFFS BY MOUTH EVERY 4 TO 6 HOURS AS NEEDED FOR SHORTNESS OF BREATH, Disp: 18 g, Rfl: 2   albuterol  (VENTOLIN  HFA) 108 (90 Base) MCG/ACT inhaler, Inhale 2 puffs into the lungs every 6 (six) hours as needed for wheezing or shortness of breath., Disp: 8 g, Rfl: 0   ARIPiprazole (ABILIFY) 2 MG tablet, Take 2 mg by mouth every morning. , Disp: , Rfl:    azelastine  (ASTELIN ) 0.1 % nasal spray, Place 1 spray into both nostrils 2 (two) times daily. Use in each nostril as directed, Disp: 30 mL, Rfl: 5   cetirizine (ZYRTEC) 10 MG tablet, Take 10 mg by mouth daily with lunch., Disp: , Rfl:    Cyanocobalamin  (B-12 COMPLIANCE INJECTION IJ), Inject 1  Dose as directed every 30 (thirty) days., Disp: , Rfl:    estradiol  (CLIMARA ) 0.025 mg/24hr patch, Place 1 patch (0.025 mg total) onto the skin once a week., Disp: 4 patch, Rfl: 12   estradiol  (ESTRACE ) 0.01 % CREA vaginal cream, Place 1 Applicatorful vaginally at bedtime. Apply peas sized amount nightly for 2 weeks, then 2 times per week, Disp: 42.5 g, Rfl: 12   famotidine  (PEPCID ) 20 MG tablet, Take 1 tablet (20 mg total) by mouth 2 (two) times daily. (Patient not taking: Reported on 11/17/2024), Disp: 60 tablet, Rfl: 0   fluticasone  (FLONASE ) 50 MCG/ACT nasal spray, Place 2 sprays into both nostrils daily. SHAKE LIQUID AND USE 2 SPRAYS IN EACH NOSTRIL EVERY DAY, Disp: 9.9 mL, Rfl: 6   fluvoxaMINE (LUVOX) 100 MG tablet, Take 150 mg by mouth every morning. , Disp: , Rfl:    gabapentin  (NEURONTIN ) 100 MG capsule, Take 200 mg by mouth daily., Disp: , Rfl:    glucose blood (ACCU-CHEK GUIDE) test strip, CHECK BLOOD SUGAR UP TO EVERY DAY AS DIRECTED, Disp: 200 strip, Rfl: 5   hydrOXYzine  (VISTARIL ) 25 MG capsule, TAKE 1 CAPSULE(25 MG) BY MOUTH EVERY 8 HOURS AS NEEDED, Disp: 30 capsule, Rfl: 0   lamoTRIgine (LAMICTAL) 200 MG tablet, Take 200 mg by mouth daily., Disp: , Rfl:    levothyroxine  (SYNTHROID ) 50 MCG tablet, Take 1 tablet (50 mcg total) by mouth daily before breakfast., Disp: 90  tablet, Rfl: 1   metoprolol  succinate (TOPROL -XL) 50 MG 24 hr tablet, Take 1 tablet (50 mg total) by mouth daily. Take with or immediately following a meal., Disp: 90 tablet, Rfl: 3   montelukast  (SINGULAIR ) 10 MG tablet, Take 1 tablet (10 mg total) by mouth at bedtime., Disp: 90 tablet, Rfl: 1   MOUNJARO  7.5 MG/0.5ML Pen, Inject 7.5 mg into the skin once a week., Disp: 2 mL, Rfl: 2   Multiple Vitamin (MULTIVITAMIN WITH MINERALS) TABS tablet, Take 1 tablet by mouth daily., Disp: , Rfl:    pantoprazole  (PROTONIX ) 20 MG tablet, TAKE 1 TABLET(20 MG) BY MOUTH DAILY, Disp: 90 tablet, Rfl: 1   progesterone  (PROMETRIUM ) 100 MG  capsule, Take 1 capsule (100 mg total) by mouth daily., Disp: 30 capsule, Rfl: 6   rosuvastatin  (CRESTOR ) 5 MG tablet, Take 1 tablet (5 mg total) by mouth at bedtime. (Patient not taking: Reported on 11/17/2024), Disp: 90 tablet, Rfl: 3   TRELEGY ELLIPTA  100-62.5-25 MCG/ACT AEPB, INHALE 1 PUFF INTO THE LUNGS DAILY, Disp: 180 each, Rfl: 1   tretinoin  (RETIN-A ) 0.025 % cream, APPLY EXTERNALLY TO THE AFFECTED AREA AT BEDTIME AS NEEDED, Disp: 45 g, Rfl: 2   valsartan  (DIOVAN ) 160 MG tablet, Take 1 tablet (160 mg total) by mouth daily., Disp: 90 tablet, Rfl: 3 No current facility-administered medications for this visit.  Facility-Administered Medications Ordered in Other Visits:    cyanocobalamin  (VITAMIN B12) injection 1,000 mcg, 1,000 mcg, Intramuscular, Once, Melanee Annah BROCKS, MD   cyanocobalamin  (VITAMIN B12) injection 1,000 mcg, 1,000 mcg, Intramuscular, Q30 days, Melanee Annah BROCKS, MD, 1,000 mcg at 02/05/24 0909   Medications ordered in this encounter:  Meds ordered this encounter  Medications   azithromycin  (ZITHROMAX ) 250 MG tablet    Sig: Take 2 tablets on day 1, then 1 tablet daily on days 2 through 5    Dispense:  6 tablet    Refill:  0    Supervising Provider:   LAMPTEY, PHILIP O [[id number]]   predniSONE  (DELTASONE ) 20 MG tablet    Sig: Take 2 tablets (40 mg total) by mouth daily with breakfast.    Dispense:  14 tablet    Refill:  0    Supervising Provider:   LAMPTEY, PHILIP O [[id number]]   promethazine -dextromethorphan (PROMETHAZINE -DM) 6.25-15 MG/5ML syrup    Sig: Take 5 mLs by mouth 4 (four) times daily as needed.    Dispense:  118 mL    Refill:  0    Supervising Provider:   BLAISE ALEENE KIDD [[id number]]     *If you need refills on other medications prior to your next appointment, please contact your pharmacy*  Follow-Up: Call back or seek an in-person evaluation if the symptoms worsen or if the condition fails to improve as anticipated.  [facility] [phone]  Other Instructions Acute Bronchitis, Adult  Acute bronchitis is sudden inflammation of the main airways (bronchi) that come off the windpipe (trachea) in the lungs. The swelling causes the airways to get smaller and make more mucus than normal. This can make it hard to breathe and can cause coughing or noisy breathing (wheezing). Acute bronchitis may last several weeks. The cough may last longer. Allergies, asthma, and exposure to smoke may make the condition worse. What are the causes? This condition can be caused by germs and by substances that irritate the lungs, including: Cold and flu viruses. The most common cause of this condition is the virus that causes the common cold.  Bacteria. This is less common. Breathing in substances that irritate the lungs, including: Smoke from cigarettes and other forms of tobacco. Dust and pollen. Fumes from household cleaning products, gases, or burned fuel. Indoor or outdoor air pollution. What increases the risk? The following factors may make you more likely to develop this condition: A weak body's defense system, also called the immune system. A condition that affects your lungs and breathing, such as asthma. What are the signs or symptoms? Common symptoms of this condition include: Coughing. This may bring up clear, yellow, or green mucus from your lungs (sputum). Wheezing. Runny or stuffy nose. Having too much mucus in your lungs (chest congestion). Shortness of breath. Aches and pains, including sore throat or chest. How is this diagnosed? This condition is usually diagnosed based on: Your symptoms and medical history. A physical exam. You may also have other tests, including tests to rule out other conditions, such as pneumonia. These tests include: A test of lung function. Test of a mucus sample to look for the presence of bacteria. Tests to check the oxygen level in your blood. Blood tests. Chest X-ray. How is this  treated? Most cases of acute bronchitis clear up over time without treatment. Your health care provider may recommend: Drinking more fluids to help thin your mucus so it is easier to cough up. Taking inhaled medicine (inhaler) to improve air flow in and out of your lungs. Using a vaporizer or a humidifier. These are machines that add water to the air to help you breathe better. Taking a medicine that thins mucus and clears congestion (expectorant). Taking a medicine that prevents or stops coughing (cough suppressant). It is not common to take an antibiotic medicine for this condition. Follow these instructions at home:  Take over-the-counter and prescription medicines only as told by your health care provider. Use an inhaler, vaporizer, or humidifier as told by your health care provider. Take two teaspoons (10 mL) of honey at bedtime to lessen coughing at night. Drink enough fluid to keep your urine pale yellow. Do not use any products that contain nicotine or tobacco. These products include cigarettes, chewing tobacco, and vaping devices, such as e-cigarettes. If you need help quitting, ask your health care provider. Get plenty of rest. Return to your normal activities as told by your health care provider. Ask your health care provider what activities are safe for you. Keep all follow-up visits. This is important. How is this prevented? To lower your risk of getting this condition again: Wash your hands often with soap and water for at least 20 seconds. If soap and water are not available, use hand sanitizer. Avoid contact with people who have cold symptoms. Try not to touch your mouth, nose, or eyes with your hands. Avoid breathing in smoke or chemical fumes. Breathing smoke or chemical fumes will make your condition worse. Get the flu shot every year. Contact a health care provider if: Your symptoms do not improve after 2 weeks. You have trouble coughing up the mucus. Your cough keeps  you awake at night. You have a fever. Get help right away if you: Cough up blood. Feel pain in your chest. Have severe shortness of breath. Faint or keep feeling like you are going to faint. Have a severe headache. Have a fever or chills that get worse. These symptoms may represent a serious problem that is an emergency. Do not wait to see if the symptoms will go away. Get medical help right away. Call your  local emergency services (911 in the [place] Do not drive yourself to the hospital. Summary Acute bronchitis is inflammation of the main airways (bronchi) that come off the windpipe (trachea) in the lungs. The swelling causes the airways to get smaller and make more mucus than normal. Drinking more fluids can help thin your mucus so it is easier to cough up. Take over-the-counter and prescription medicines only as told by your health care provider. Do not use any products that contain nicotine or tobacco. These products include cigarettes, chewing tobacco, and vaping devices, such as e-cigarettes. If you need help quitting, ask your health care provider. Contact a health care provider if your symptoms do not improve after 2 weeks. This information is not intended to replace advice given to you by your health care provider. Make sure you discuss any questions you have with your health care provider. Document Revised: 03/15/2022 Document Reviewed: 04/05/2021 Elsevier Patient Education  2024 Elsevier Inc.   If you have been instructed to have an in-person evaluation today at a local Urgent Care facility, please use the link below. It will take you to a list of all of our available [facility], including address, phone number and hours of operation. Please do not delay care.  [facility]  If you or a family member do not have a primary care provider, use the link below to schedule a visit and establish care. When you choose a [facility] primary care physician or  advanced practice provider, you gain a long-term partner in health. Find a Primary Care Provider  Learn more about [facility]'s in-office and virtual care options: [facility] - Get Care Now "

## 2025-01-01 ENCOUNTER — Encounter (HOSPITAL_COMMUNITY): Payer: Self-pay

## 2025-01-01 NOTE — Telephone Encounter (Signed)
 Requested medications are due for refill today.  yes  Requested medications are on the active medications list.  yes  Last refill. 01/01/2024 180 1 rf  Future visit scheduled.   yes  Notes to clinic.  Medication not assigned to a protocol. Please review for refill.     Requested Prescriptions  Pending Prescriptions Disp Refills   TRELEGY ELLIPTA  100-62.5-25 MCG/ACT AEPB [Pharmacy Med Name: TRELEGY ELLIPTA  100-62. INH 30P] 180 each 1    Sig: INHALE 1 PUFF INTO THE LUNGS DAILY     Off-Protocol Failed - 01/01/2025  9:53 AM      Failed - Medication not assigned to a protocol, review manually.      Passed - Valid encounter within last 12 months    Recent Outpatient Visits           1 month ago Hypothyroidism, unspecified type   [place] [facility] [place], Marsa PARAS, DO   2 months ago Essential (primary) hypertension   [place] [facility] [place] Marsa PARAS, [place]   5 months ago Mixed hyperlipidemia   [place] [facility] [place]a PARAS, [place]   9 months ago Chronic maxillary sinusitis   [place] [facility] [place] Marsa PARAS, DO   9 months ago Non-recurrent acute suppurative otitis media of right ear without spontaneous rupture of tympanic membrane    [facility] [place], Marsa PARAS, [place]

## 2025-01-04 ENCOUNTER — Ambulatory Visit
Admission: RE | Admit: 2025-01-04 | Discharge: 2025-01-04 | Disposition: A | Discharge status: Home / Self Care | Source: Ambulatory Visit | Attending: Internal Medicine | Admitting: Internal Medicine

## 2025-01-04 ENCOUNTER — Inpatient Hospital Stay

## 2025-01-04 ENCOUNTER — Inpatient Hospital Stay: Attending: Oncology

## 2025-01-04 DIAGNOSIS — E785 Hyperlipidemia, unspecified: Secondary | ICD-10-CM | POA: Insufficient documentation

## 2025-01-04 DIAGNOSIS — E538 Deficiency of other specified B group vitamins: Secondary | ICD-10-CM | POA: Insufficient documentation

## 2025-01-04 DIAGNOSIS — R0789 Other chest pain: Secondary | ICD-10-CM | POA: Insufficient documentation

## 2025-01-04 DIAGNOSIS — D509 Iron deficiency anemia, unspecified: Secondary | ICD-10-CM

## 2025-01-04 DIAGNOSIS — R9439 Abnormal result of other cardiovascular function study: Secondary | ICD-10-CM | POA: Insufficient documentation

## 2025-01-04 DIAGNOSIS — I2089 Other forms of angina pectoris: Secondary | ICD-10-CM | POA: Insufficient documentation

## 2025-01-04 MED ORDER — CYANOCOBALAMIN 1000 MCG/ML IJ SOLN
1000.0000 ug | INTRAMUSCULAR | Status: DC
  Administered 2025-01-04: 1000 ug via INTRAMUSCULAR
  Filled 2025-01-04: qty 1

## 2025-01-04 MED ORDER — IOHEXOL 350 MG/ML SOLN
100.0000 mL | Freq: Once | INTRAVENOUS | Status: AC | PRN
  Administered 2025-01-04: 100 mL via INTRAVENOUS

## 2025-01-04 MED ORDER — DILTIAZEM HCL 25 MG/5ML IV SOLN
10.0000 mg | INTRAVENOUS | Status: DC | PRN
  Administered 2025-01-04: 10 mg via INTRAVENOUS
  Filled 2025-01-04: qty 5

## 2025-01-04 MED ORDER — METOPROLOL TARTRATE 5 MG/5ML IV SOLN
INTRAVENOUS | Status: AC
  Filled 2025-01-04: qty 10

## 2025-01-04 MED ORDER — DILTIAZEM HCL 25 MG/5ML IV SOLN
INTRAVENOUS | Status: AC
  Filled 2025-01-04: qty 5

## 2025-01-04 MED ORDER — NITROGLYCERIN 0.4 MG SL SUBL
0.8000 mg | SUBLINGUAL_TABLET | Freq: Once | SUBLINGUAL | Status: AC
  Administered 2025-01-04: 0.8 mg via SUBLINGUAL
  Filled 2025-01-04: qty 25

## 2025-01-04 MED ORDER — METOPROLOL TARTRATE 5 MG/5ML IV SOLN
10.0000 mg | Freq: Once | INTRAVENOUS | Status: AC | PRN
  Administered 2025-01-04: 10 mg via INTRAVENOUS

## 2025-01-04 NOTE — Progress Notes (Signed)
 Patient tolerated procedure well. W/C to lobby.  Ambulate w/o difficulty. Denies light headedness or being dizzy. Encouraged to drink extra water today and reasoning explained. Verbalized understanding. All questions answered. ABC intact. No further needs. Discharge from procedure area w/o issues.

## 2025-01-05 ENCOUNTER — Other Ambulatory Visit: Payer: Self-pay | Admitting: Family Medicine

## 2025-01-05 DIAGNOSIS — K219 Gastro-esophageal reflux disease without esophagitis: Secondary | ICD-10-CM

## 2025-01-05 NOTE — Telephone Encounter (Signed)
 Requested Prescriptions  Pending Prescriptions Disp Refills   pantoprazole  (PROTONIX ) 20 MG tablet [Pharmacy Med Name: PANTOPRAZOLE  20MG  TABLETS] 90 tablet 1    Sig: TAKE 1 TABLET(20 MG) BY MOUTH DAILY     Gastroenterology: Proton Pump Inhibitors Passed - 01/05/2025  4:29 PM      Passed - Valid encounter within last 12 months    Recent Outpatient Visits           1 month ago Hypothyroidism, unspecified type   [place] [facility] [place], Marsa PARAS, DO   2 months ago Essential (primary) hypertension   [place] [facility] [place] Marsa PARAS, DO   5 months ago Mixed hyperlipidemia   [place] [facility] [place] Marsa PARAS, DO   9 months ago Chronic maxillary sinusitis   [place] [facility] [place] Marsa PARAS, DO   9 months ago Non-recurrent acute suppurative otitis media of right ear without spontaneous rupture of tympanic membrane    [facility] [place], Marsa PARAS, [place]

## 2025-01-07 ENCOUNTER — Other Ambulatory Visit: Payer: Self-pay | Admitting: Licensed Practical Nurse

## 2025-01-07 ENCOUNTER — Other Ambulatory Visit (HOSPITAL_COMMUNITY): Payer: Self-pay

## 2025-01-07 ENCOUNTER — Encounter: Payer: Self-pay | Admitting: Urgent Care

## 2025-01-07 NOTE — Progress Notes (Signed)
 Pt prescribed Progesterone  for HRT, pt contacted this CNM stating she was not able to get it, this CNM called [facility], per the pharmacist, this prescription was run through her insurance, the pt needs to go to the pharmacy and present an update insurance plan.  Pt called [facility], mychart message sent Jinnie Cookey, [place]  [facility] 01/07/25  9:23 AM  '

## 2025-01-08 ENCOUNTER — Other Ambulatory Visit (HOSPITAL_COMMUNITY): Payer: Self-pay

## 2025-01-08 ENCOUNTER — Telehealth: Payer: Self-pay | Admitting: Pharmacy Technician

## 2025-01-08 NOTE — Telephone Encounter (Signed)
 Pharmacy Patient Advocate Encounter   Received notification from Cc'd charts that prior authorization for Progesterone  100MG  capsules is required/requested.   Insurance verification completed.   The patient is insured through [employer].   Per test claim: PA required; PA submitted to above mentioned insurance via Latent Key/confirmation #/EOC [id number] Status is pending

## 2025-01-11 NOTE — Telephone Encounter (Signed)
 Pharmacy Patient Advocate Encounter  Received notification from [facility] that Prior Authorization for Progesterone  100MG  capsules  has been DENIED.  Full denial letter will be uploaded to the media tab. See denial reason below.   PA #/Case ID/Reference #: [id number]

## 2025-01-13 ENCOUNTER — Encounter: Payer: Self-pay | Admitting: Licensed Practical Nurse

## 2025-01-13 ENCOUNTER — Telehealth: Payer: Self-pay | Admitting: Licensed Practical Nurse

## 2025-01-13 NOTE — Telephone Encounter (Signed)
 Pt seen for menopausal symptoms, was prescribed estrogen patch and oral progesterone , Progesterone  was denied by POA.  Medicare called, Verbal appeal made over the phone, the pt not a candidate  for the medications listed as alternative (all were oral estrogen based). In order to use the estrogen patch that was prescribed,  the pt needs the progesterone . She cannot be treated for her menopausal symptoms with estrogen alone.  Mychart message sent to pt with update Jinnie Cookey, CNM  [facility] 01/13/2025 2:46 PM

## 2025-01-18 ENCOUNTER — Other Ambulatory Visit (HOSPITAL_COMMUNITY): Payer: Self-pay

## 2025-01-18 NOTE — Telephone Encounter (Signed)
 Pharmacy Patient Advocate Encounter  Received notification from [facility] that Prior Authorization for Progesterone  100MG  capsules has been APPROVED from 01/08/2025 to further notice. Ran test claim, Copay is $1.60. This test claim was processed through [facility]- copay amounts may vary at other pharmacies due to pharmacy/plan contracts, or as the patient moves through the different stages of their insurance plan.   PA #/Case ID/Reference #: [id number]

## 2025-02-04 ENCOUNTER — Inpatient Hospital Stay: Attending: Oncology

## 2025-02-17 ENCOUNTER — Ambulatory Visit: Admitting: Family Medicine

## 2025-03-04 ENCOUNTER — Inpatient Hospital Stay: Attending: Oncology

## 2025-04-05 ENCOUNTER — Inpatient Hospital Stay: Attending: Oncology

## 2025-05-05 ENCOUNTER — Inpatient Hospital Stay: Attending: Oncology

## 2025-06-04 ENCOUNTER — Inpatient Hospital Stay: Attending: Oncology

## 2025-07-05 ENCOUNTER — Inpatient Hospital Stay: Attending: Oncology

## 2025-08-05 ENCOUNTER — Inpatient Hospital Stay: Attending: Oncology

## 2025-09-03 ENCOUNTER — Ambulatory Visit

## 2025-09-03 ENCOUNTER — Ambulatory Visit: Admitting: Oncology

## 2025-09-03 ENCOUNTER — Other Ambulatory Visit
